# Patient Record
Sex: Female | Born: 1950 | Race: Black or African American | Hispanic: No | Marital: Single | Smoking: Current every day smoker
Health system: Southern US, Community
[De-identification: ages and names within clinical notes are randomized; demographics above are authoritative.]

## PROBLEM LIST (undated history)

## (undated) DIAGNOSIS — G5600 Carpal tunnel syndrome, unspecified upper limb: Secondary | ICD-10-CM

## (undated) DIAGNOSIS — M1990 Unspecified osteoarthritis, unspecified site: Secondary | ICD-10-CM

## (undated) DIAGNOSIS — K922 Gastrointestinal hemorrhage, unspecified: Secondary | ICD-10-CM

## (undated) DIAGNOSIS — G5603 Carpal tunnel syndrome, bilateral upper limbs: Secondary | ICD-10-CM

## (undated) DIAGNOSIS — I70219 Atherosclerosis of native arteries of extremities with intermittent claudication, unspecified extremity: Secondary | ICD-10-CM

## (undated) DIAGNOSIS — L98499 Non-pressure chronic ulcer of skin of other sites with unspecified severity: Secondary | ICD-10-CM

## (undated) DIAGNOSIS — I429 Cardiomyopathy, unspecified: Secondary | ICD-10-CM

## (undated) DIAGNOSIS — I739 Peripheral vascular disease, unspecified: Secondary | ICD-10-CM

## (undated) DIAGNOSIS — I442 Atrioventricular block, complete: Secondary | ICD-10-CM

## (undated) DIAGNOSIS — E11621 Type 2 diabetes mellitus with foot ulcer: Secondary | ICD-10-CM

## (undated) DIAGNOSIS — Z950 Presence of cardiac pacemaker: Secondary | ICD-10-CM

## (undated) DIAGNOSIS — I455 Other specified heart block: Secondary | ICD-10-CM

## (undated) DIAGNOSIS — D649 Anemia, unspecified: Secondary | ICD-10-CM

## (undated) DIAGNOSIS — R911 Solitary pulmonary nodule: Secondary | ICD-10-CM

## (undated) DIAGNOSIS — F1011 Alcohol abuse, in remission: Secondary | ICD-10-CM

## (undated) DIAGNOSIS — N184 Chronic kidney disease, stage 4 (severe): Secondary | ICD-10-CM

## (undated) DIAGNOSIS — E7800 Pure hypercholesterolemia, unspecified: Secondary | ICD-10-CM

## (undated) DIAGNOSIS — Z720 Tobacco use: Secondary | ICD-10-CM

## (undated) DIAGNOSIS — R413 Other amnesia: Secondary | ICD-10-CM

## (undated) DIAGNOSIS — K529 Noninfective gastroenteritis and colitis, unspecified: Secondary | ICD-10-CM

## (undated) DIAGNOSIS — I469 Cardiac arrest, cause unspecified: Secondary | ICD-10-CM

## (undated) DIAGNOSIS — B351 Tinea unguium: Secondary | ICD-10-CM

## (undated) DIAGNOSIS — H409 Unspecified glaucoma: Secondary | ICD-10-CM

## (undated) DIAGNOSIS — I2510 Atherosclerotic heart disease of native coronary artery without angina pectoris: Secondary | ICD-10-CM

## (undated) DIAGNOSIS — I509 Heart failure, unspecified: Secondary | ICD-10-CM

## (undated) DIAGNOSIS — J302 Other seasonal allergic rhinitis: Secondary | ICD-10-CM

## (undated) DIAGNOSIS — L97509 Non-pressure chronic ulcer of other part of unspecified foot with unspecified severity: Secondary | ICD-10-CM

## (undated) DIAGNOSIS — Z794 Long term (current) use of insulin: Secondary | ICD-10-CM

## (undated) DIAGNOSIS — Z5189 Encounter for other specified aftercare: Secondary | ICD-10-CM

## (undated) DIAGNOSIS — M75100 Unspecified rotator cuff tear or rupture of unspecified shoulder, not specified as traumatic: Secondary | ICD-10-CM

## (undated) DIAGNOSIS — R51 Headache: Secondary | ICD-10-CM

## (undated) DIAGNOSIS — K559 Vascular disorder of intestine, unspecified: Secondary | ICD-10-CM

## (undated) DIAGNOSIS — I10 Essential (primary) hypertension: Secondary | ICD-10-CM

## (undated) DIAGNOSIS — Z87442 Personal history of urinary calculi: Secondary | ICD-10-CM

## (undated) DIAGNOSIS — E119 Type 2 diabetes mellitus without complications: Secondary | ICD-10-CM

## (undated) HISTORY — PX: ABDOMINAL HYSTERECTOMY: SHX81

## (undated) HISTORY — DX: Vascular disorder of intestine, unspecified: K55.9

## (undated) HISTORY — DX: Atrioventricular block, complete: I44.2

## (undated) HISTORY — DX: Atherosclerotic heart disease of native coronary artery without angina pectoris: I25.10

## (undated) HISTORY — PX: JOINT REPLACEMENT: SHX530

## (undated) HISTORY — DX: Tobacco use: Z72.0

## (undated) HISTORY — PX: CARPAL TUNNEL RELEASE: SHX101

## (undated) HISTORY — DX: Type 2 diabetes mellitus without complications: E11.9

## (undated) HISTORY — DX: Non-pressure chronic ulcer of skin of other sites with unspecified severity: L98.499

## (undated) HISTORY — PX: EYE SURGERY: SHX253

## (undated) HISTORY — DX: Other seasonal allergic rhinitis: J30.2

## (undated) HISTORY — DX: Peripheral vascular disease, unspecified: I73.9

## (undated) HISTORY — DX: Type 2 diabetes mellitus without complications: Z79.4

## (undated) HISTORY — PX: TOE AMPUTATION: SHX809

## (undated) HISTORY — DX: Solitary pulmonary nodule: R91.1

## (undated) HISTORY — DX: Tinea unguium: B35.1

## (undated) HISTORY — DX: Atherosclerosis of native arteries of extremities with intermittent claudication, unspecified extremity: I70.219

## (undated) HISTORY — PX: TONSILLECTOMY: SUR1361

## (undated) HISTORY — DX: Unspecified osteoarthritis, unspecified site: M19.90

## (undated) HISTORY — DX: Presence of cardiac pacemaker: Z95.0

## (undated) HISTORY — DX: Noninfective gastroenteritis and colitis, unspecified: K52.9

## (undated) HISTORY — DX: Type 2 diabetes mellitus with foot ulcer: L97.509

## (undated) HISTORY — DX: Carpal tunnel syndrome, bilateral upper limbs: G56.03

## (undated) HISTORY — DX: Other specified heart block: I45.5

## (undated) HISTORY — DX: Cardiac arrest, cause unspecified: I46.9

## (undated) HISTORY — DX: Essential (primary) hypertension: I10

## (undated) HISTORY — DX: Alcohol abuse, in remission: F10.11

## (undated) HISTORY — DX: Type 2 diabetes mellitus with foot ulcer: E11.621

## (undated) HISTORY — DX: Unspecified glaucoma: H40.9

## (undated) HISTORY — DX: Other amnesia: R41.3

---

## 1998-02-22 ENCOUNTER — Encounter: Admission: RE | Admit: 1998-02-22 | Discharge: 1998-02-22 | Payer: Self-pay | Admitting: Internal Medicine

## 1998-03-16 ENCOUNTER — Ambulatory Visit (HOSPITAL_COMMUNITY): Admission: RE | Admit: 1998-03-16 | Discharge: 1998-03-16 | Payer: Self-pay | Admitting: Podiatry

## 1998-08-28 ENCOUNTER — Encounter: Admission: RE | Admit: 1998-08-28 | Discharge: 1998-08-28 | Payer: Self-pay | Admitting: Hematology and Oncology

## 1998-09-06 ENCOUNTER — Encounter: Admission: RE | Admit: 1998-09-06 | Discharge: 1998-09-06 | Payer: Self-pay | Admitting: Hematology and Oncology

## 1998-09-11 ENCOUNTER — Encounter: Admission: RE | Admit: 1998-09-11 | Discharge: 1998-09-11 | Payer: Self-pay | Admitting: Hematology and Oncology

## 1999-02-25 ENCOUNTER — Other Ambulatory Visit: Admission: RE | Admit: 1999-02-25 | Discharge: 1999-03-15 | Payer: Self-pay

## 1999-02-25 ENCOUNTER — Encounter: Admission: RE | Admit: 1999-02-25 | Discharge: 1999-02-25 | Payer: Self-pay | Admitting: Internal Medicine

## 1999-05-20 ENCOUNTER — Encounter: Admission: RE | Admit: 1999-05-20 | Discharge: 1999-05-20 | Payer: Self-pay | Admitting: Internal Medicine

## 1999-05-20 ENCOUNTER — Other Ambulatory Visit: Admission: RE | Admit: 1999-05-20 | Discharge: 1999-05-20 | Payer: Self-pay | Admitting: Internal Medicine

## 1999-08-12 ENCOUNTER — Encounter: Admission: RE | Admit: 1999-08-12 | Discharge: 1999-08-12 | Payer: Self-pay | Admitting: Internal Medicine

## 1999-09-20 ENCOUNTER — Encounter: Admission: RE | Admit: 1999-09-20 | Discharge: 1999-09-20 | Payer: Self-pay | Admitting: Internal Medicine

## 2000-02-21 ENCOUNTER — Encounter: Admission: RE | Admit: 2000-02-21 | Discharge: 2000-02-21 | Payer: Self-pay | Admitting: Hematology and Oncology

## 2000-06-05 ENCOUNTER — Emergency Department (HOSPITAL_COMMUNITY): Admission: EM | Admit: 2000-06-05 | Discharge: 2000-06-05 | Payer: Self-pay | Admitting: Emergency Medicine

## 2000-06-06 ENCOUNTER — Encounter: Payer: Self-pay | Admitting: Emergency Medicine

## 2000-06-23 ENCOUNTER — Encounter: Payer: Self-pay | Admitting: Dermatology

## 2000-06-23 ENCOUNTER — Encounter: Admission: RE | Admit: 2000-06-23 | Discharge: 2000-06-23 | Payer: Self-pay | Admitting: Dermatology

## 2000-06-26 ENCOUNTER — Encounter: Admission: RE | Admit: 2000-06-26 | Discharge: 2000-06-26 | Payer: Self-pay | Admitting: Internal Medicine

## 2000-06-29 ENCOUNTER — Encounter (HOSPITAL_COMMUNITY): Admission: RE | Admit: 2000-06-29 | Discharge: 2000-09-27 | Payer: Self-pay | Admitting: Orthopedic Surgery

## 2000-11-20 ENCOUNTER — Encounter: Admission: RE | Admit: 2000-11-20 | Discharge: 2000-11-20 | Payer: Self-pay | Admitting: Obstetrics & Gynecology

## 2000-12-04 ENCOUNTER — Encounter: Admission: RE | Admit: 2000-12-04 | Discharge: 2000-12-04 | Payer: Self-pay | Admitting: Obstetrics & Gynecology

## 2000-12-09 ENCOUNTER — Ambulatory Visit (HOSPITAL_COMMUNITY): Admission: RE | Admit: 2000-12-09 | Discharge: 2000-12-09 | Payer: Self-pay | Admitting: Obstetrics

## 2001-01-05 ENCOUNTER — Encounter: Admission: RE | Admit: 2001-01-05 | Discharge: 2001-01-05 | Payer: Self-pay | Admitting: Obstetrics & Gynecology

## 2001-01-05 ENCOUNTER — Other Ambulatory Visit: Admission: RE | Admit: 2001-01-05 | Discharge: 2001-01-05 | Payer: Self-pay | Admitting: *Deleted

## 2001-01-19 ENCOUNTER — Encounter: Admission: RE | Admit: 2001-01-19 | Discharge: 2001-01-19 | Payer: Self-pay | Admitting: Obstetrics & Gynecology

## 2001-01-21 ENCOUNTER — Encounter: Admission: RE | Admit: 2001-01-21 | Discharge: 2001-01-21 | Payer: Self-pay | Admitting: Hematology and Oncology

## 2001-01-29 ENCOUNTER — Inpatient Hospital Stay (HOSPITAL_COMMUNITY): Admission: EM | Admit: 2001-01-29 | Discharge: 2001-01-30 | Payer: Self-pay | Admitting: Emergency Medicine

## 2001-01-29 ENCOUNTER — Encounter: Payer: Self-pay | Admitting: Emergency Medicine

## 2001-02-02 ENCOUNTER — Encounter: Admission: RE | Admit: 2001-02-02 | Discharge: 2001-02-02 | Payer: Self-pay | Admitting: Hematology and Oncology

## 2001-02-25 ENCOUNTER — Inpatient Hospital Stay (HOSPITAL_COMMUNITY): Admission: RE | Admit: 2001-02-25 | Discharge: 2001-02-28 | Payer: Self-pay | Admitting: *Deleted

## 2001-02-25 ENCOUNTER — Encounter (INDEPENDENT_AMBULATORY_CARE_PROVIDER_SITE_OTHER): Payer: Self-pay

## 2001-03-04 ENCOUNTER — Encounter: Admission: RE | Admit: 2001-03-04 | Discharge: 2001-03-04 | Payer: Self-pay | Admitting: Obstetrics

## 2001-04-15 ENCOUNTER — Encounter: Admission: RE | Admit: 2001-04-15 | Discharge: 2001-04-15 | Payer: Self-pay | Admitting: Obstetrics

## 2001-04-16 ENCOUNTER — Encounter: Admission: RE | Admit: 2001-04-16 | Discharge: 2001-04-16 | Payer: Self-pay | Admitting: Orthopedic Surgery

## 2001-05-18 ENCOUNTER — Encounter: Admission: RE | Admit: 2001-05-18 | Discharge: 2001-05-18 | Payer: Self-pay | Admitting: Orthopedic Surgery

## 2001-05-28 ENCOUNTER — Encounter: Admission: RE | Admit: 2001-05-28 | Discharge: 2001-05-28 | Payer: Self-pay | Admitting: Obstetrics & Gynecology

## 2001-06-09 ENCOUNTER — Emergency Department (HOSPITAL_COMMUNITY): Admission: EM | Admit: 2001-06-09 | Discharge: 2001-06-10 | Payer: Self-pay | Admitting: Emergency Medicine

## 2001-06-10 ENCOUNTER — Ambulatory Visit (HOSPITAL_COMMUNITY): Admission: RE | Admit: 2001-06-10 | Discharge: 2001-06-10 | Payer: Self-pay | Admitting: Obstetrics & Gynecology

## 2001-06-29 ENCOUNTER — Inpatient Hospital Stay (HOSPITAL_COMMUNITY): Admission: AD | Admit: 2001-06-29 | Discharge: 2001-07-10 | Payer: Self-pay | Admitting: Vascular Surgery

## 2001-06-29 ENCOUNTER — Encounter: Payer: Self-pay | Admitting: Vascular Surgery

## 2001-07-01 ENCOUNTER — Encounter: Payer: Self-pay | Admitting: Vascular Surgery

## 2001-09-29 HISTORY — PX: FEMORAL-POPLITEAL BYPASS GRAFT: SHX937

## 2001-10-12 ENCOUNTER — Encounter (HOSPITAL_COMMUNITY): Admission: RE | Admit: 2001-10-12 | Discharge: 2001-12-30 | Payer: Self-pay | Admitting: Orthopedic Surgery

## 2001-11-30 ENCOUNTER — Encounter: Admission: RE | Admit: 2001-11-30 | Discharge: 2001-11-30 | Payer: Self-pay | Admitting: *Deleted

## 2001-12-09 ENCOUNTER — Ambulatory Visit (HOSPITAL_COMMUNITY): Admission: RE | Admit: 2001-12-09 | Discharge: 2001-12-09 | Payer: Self-pay

## 2001-12-10 ENCOUNTER — Encounter: Payer: Self-pay | Admitting: *Deleted

## 2001-12-10 ENCOUNTER — Encounter: Admission: RE | Admit: 2001-12-10 | Discharge: 2001-12-10 | Payer: Self-pay | Admitting: *Deleted

## 2002-01-25 ENCOUNTER — Encounter: Admission: RE | Admit: 2002-01-25 | Discharge: 2002-01-25 | Payer: Self-pay | Admitting: *Deleted

## 2002-01-26 ENCOUNTER — Encounter: Payer: Self-pay | Admitting: *Deleted

## 2002-01-27 ENCOUNTER — Ambulatory Visit (HOSPITAL_COMMUNITY): Admission: RE | Admit: 2002-01-27 | Discharge: 2002-01-27 | Payer: Self-pay | Admitting: *Deleted

## 2002-02-02 ENCOUNTER — Encounter: Payer: Self-pay | Admitting: *Deleted

## 2002-02-02 ENCOUNTER — Inpatient Hospital Stay (HOSPITAL_COMMUNITY): Admission: RE | Admit: 2002-02-02 | Discharge: 2002-02-08 | Payer: Self-pay | Admitting: *Deleted

## 2002-02-03 ENCOUNTER — Encounter: Payer: Self-pay | Admitting: *Deleted

## 2002-02-05 ENCOUNTER — Encounter: Payer: Self-pay | Admitting: Vascular Surgery

## 2002-03-10 ENCOUNTER — Encounter: Admission: RE | Admit: 2002-03-10 | Discharge: 2002-03-10 | Payer: Self-pay | Admitting: Internal Medicine

## 2002-04-18 ENCOUNTER — Encounter: Admission: RE | Admit: 2002-04-18 | Discharge: 2002-04-18 | Payer: Self-pay | Admitting: Internal Medicine

## 2002-06-14 ENCOUNTER — Encounter: Admission: RE | Admit: 2002-06-14 | Discharge: 2002-06-14 | Payer: Self-pay | Admitting: Internal Medicine

## 2002-08-16 ENCOUNTER — Encounter: Admission: RE | Admit: 2002-08-16 | Discharge: 2002-08-16 | Payer: Self-pay | Admitting: Internal Medicine

## 2002-08-30 ENCOUNTER — Ambulatory Visit (HOSPITAL_COMMUNITY): Admission: RE | Admit: 2002-08-30 | Discharge: 2002-08-30 | Payer: Self-pay | Admitting: Internal Medicine

## 2002-11-23 ENCOUNTER — Encounter: Payer: Self-pay | Admitting: Vascular Surgery

## 2002-11-23 ENCOUNTER — Encounter: Admission: RE | Admit: 2002-11-23 | Discharge: 2002-11-23 | Payer: Self-pay | Admitting: Vascular Surgery

## 2002-12-05 ENCOUNTER — Emergency Department (HOSPITAL_COMMUNITY): Admission: EM | Admit: 2002-12-05 | Discharge: 2002-12-05 | Payer: Self-pay | Admitting: Emergency Medicine

## 2003-01-17 ENCOUNTER — Encounter: Admission: RE | Admit: 2003-01-17 | Discharge: 2003-01-17 | Payer: Self-pay | Admitting: Internal Medicine

## 2003-01-27 ENCOUNTER — Encounter: Admission: RE | Admit: 2003-01-27 | Discharge: 2003-01-27 | Payer: Self-pay | Admitting: Internal Medicine

## 2003-03-08 ENCOUNTER — Encounter: Admission: RE | Admit: 2003-03-08 | Discharge: 2003-03-08 | Payer: Self-pay | Admitting: Internal Medicine

## 2003-03-29 ENCOUNTER — Ambulatory Visit (HOSPITAL_COMMUNITY): Admission: RE | Admit: 2003-03-29 | Discharge: 2003-03-29 | Payer: Self-pay | Admitting: General Practice

## 2003-03-29 ENCOUNTER — Encounter: Payer: Self-pay | Admitting: Cardiovascular Disease

## 2003-04-13 ENCOUNTER — Encounter: Admission: RE | Admit: 2003-04-13 | Discharge: 2003-04-13 | Payer: Self-pay | Admitting: Internal Medicine

## 2003-05-05 ENCOUNTER — Ambulatory Visit (HOSPITAL_COMMUNITY): Admission: RE | Admit: 2003-05-05 | Discharge: 2003-05-05 | Payer: Self-pay | Admitting: *Deleted

## 2003-09-04 ENCOUNTER — Ambulatory Visit (HOSPITAL_COMMUNITY): Admission: RE | Admit: 2003-09-04 | Discharge: 2003-09-04 | Payer: Self-pay | Admitting: Internal Medicine

## 2003-09-30 DIAGNOSIS — Z5189 Encounter for other specified aftercare: Secondary | ICD-10-CM

## 2003-09-30 DIAGNOSIS — IMO0001 Reserved for inherently not codable concepts without codable children: Secondary | ICD-10-CM

## 2003-09-30 HISTORY — DX: Encounter for other specified aftercare: Z51.89

## 2003-09-30 HISTORY — DX: Reserved for inherently not codable concepts without codable children: IMO0001

## 2003-10-16 ENCOUNTER — Emergency Department (HOSPITAL_COMMUNITY): Admission: EM | Admit: 2003-10-16 | Discharge: 2003-10-17 | Payer: Self-pay | Admitting: Emergency Medicine

## 2003-11-15 ENCOUNTER — Encounter: Admission: RE | Admit: 2003-11-15 | Discharge: 2003-11-15 | Payer: Self-pay | Admitting: Internal Medicine

## 2003-12-04 ENCOUNTER — Ambulatory Visit (HOSPITAL_COMMUNITY): Admission: RE | Admit: 2003-12-04 | Discharge: 2003-12-04 | Payer: Self-pay | Admitting: Internal Medicine

## 2004-01-04 ENCOUNTER — Emergency Department (HOSPITAL_COMMUNITY): Admission: EM | Admit: 2004-01-04 | Discharge: 2004-01-04 | Payer: Self-pay

## 2004-01-09 ENCOUNTER — Encounter: Admission: RE | Admit: 2004-01-09 | Discharge: 2004-03-01 | Payer: Self-pay | Admitting: Orthopedic Surgery

## 2004-05-08 ENCOUNTER — Encounter: Admission: RE | Admit: 2004-05-08 | Discharge: 2004-05-08 | Payer: Self-pay | Admitting: Internal Medicine

## 2004-05-13 ENCOUNTER — Ambulatory Visit (HOSPITAL_COMMUNITY): Admission: RE | Admit: 2004-05-13 | Discharge: 2004-05-13 | Payer: Self-pay | Admitting: Vascular Surgery

## 2004-05-21 ENCOUNTER — Inpatient Hospital Stay (HOSPITAL_COMMUNITY): Admission: RE | Admit: 2004-05-21 | Discharge: 2004-05-23 | Payer: Self-pay | Admitting: Vascular Surgery

## 2004-06-02 DIAGNOSIS — I469 Cardiac arrest, cause unspecified: Secondary | ICD-10-CM | POA: Insufficient documentation

## 2004-06-03 ENCOUNTER — Ambulatory Visit: Payer: Self-pay | Admitting: Internal Medicine

## 2004-06-03 ENCOUNTER — Inpatient Hospital Stay (HOSPITAL_COMMUNITY): Admission: EM | Admit: 2004-06-03 | Discharge: 2004-06-18 | Payer: Self-pay | Admitting: Emergency Medicine

## 2004-06-03 ENCOUNTER — Encounter: Payer: Self-pay | Admitting: Cardiology

## 2004-07-24 ENCOUNTER — Ambulatory Visit: Payer: Self-pay | Admitting: Internal Medicine

## 2004-09-18 ENCOUNTER — Encounter: Admission: RE | Admit: 2004-09-18 | Discharge: 2004-09-18 | Payer: Self-pay | Admitting: Internal Medicine

## 2004-10-18 ENCOUNTER — Ambulatory Visit: Payer: Self-pay | Admitting: Internal Medicine

## 2004-12-17 ENCOUNTER — Ambulatory Visit: Payer: Self-pay | Admitting: Internal Medicine

## 2005-01-29 ENCOUNTER — Ambulatory Visit: Payer: Self-pay | Admitting: Internal Medicine

## 2005-03-12 ENCOUNTER — Ambulatory Visit: Payer: Self-pay | Admitting: Internal Medicine

## 2005-05-26 ENCOUNTER — Ambulatory Visit: Payer: Self-pay | Admitting: Internal Medicine

## 2005-09-10 ENCOUNTER — Emergency Department (HOSPITAL_COMMUNITY): Admission: EM | Admit: 2005-09-10 | Discharge: 2005-09-10 | Payer: Self-pay | Admitting: Emergency Medicine

## 2005-09-15 ENCOUNTER — Ambulatory Visit: Payer: Self-pay | Admitting: Internal Medicine

## 2006-02-18 ENCOUNTER — Emergency Department (HOSPITAL_COMMUNITY): Admission: EM | Admit: 2006-02-18 | Discharge: 2006-02-18 | Payer: Self-pay | Admitting: *Deleted

## 2006-03-24 ENCOUNTER — Encounter: Admission: RE | Admit: 2006-03-24 | Discharge: 2006-05-20 | Payer: Self-pay | Admitting: General Practice

## 2006-04-14 ENCOUNTER — Ambulatory Visit: Payer: Self-pay | Admitting: Internal Medicine

## 2006-06-27 ENCOUNTER — Emergency Department (HOSPITAL_COMMUNITY): Admission: EM | Admit: 2006-06-27 | Discharge: 2006-06-27 | Payer: Self-pay | Admitting: Family Medicine

## 2006-07-17 ENCOUNTER — Ambulatory Visit: Payer: Self-pay | Admitting: Internal Medicine

## 2006-07-17 ENCOUNTER — Encounter (INDEPENDENT_AMBULATORY_CARE_PROVIDER_SITE_OTHER): Payer: Self-pay | Admitting: *Deleted

## 2006-07-17 LAB — CONVERTED CEMR LAB
B-12, serum: 539 pg/mL (ref 211–911)
T4, Total: 7.6 ug/dL (ref 5.0–12.5)

## 2006-07-20 ENCOUNTER — Ambulatory Visit (HOSPITAL_COMMUNITY): Admission: RE | Admit: 2006-07-20 | Discharge: 2006-07-20 | Payer: Self-pay | Admitting: *Deleted

## 2006-08-12 DIAGNOSIS — J309 Allergic rhinitis, unspecified: Secondary | ICD-10-CM | POA: Insufficient documentation

## 2006-08-12 DIAGNOSIS — E1139 Type 2 diabetes mellitus with other diabetic ophthalmic complication: Secondary | ICD-10-CM | POA: Insufficient documentation

## 2006-08-12 DIAGNOSIS — I10 Essential (primary) hypertension: Secondary | ICD-10-CM | POA: Insufficient documentation

## 2006-08-12 DIAGNOSIS — E1149 Type 2 diabetes mellitus with other diabetic neurological complication: Secondary | ICD-10-CM | POA: Insufficient documentation

## 2006-08-12 DIAGNOSIS — I739 Peripheral vascular disease, unspecified: Secondary | ICD-10-CM | POA: Insufficient documentation

## 2006-08-12 DIAGNOSIS — F1021 Alcohol dependence, in remission: Secondary | ICD-10-CM | POA: Insufficient documentation

## 2006-08-12 DIAGNOSIS — I2510 Atherosclerotic heart disease of native coronary artery without angina pectoris: Secondary | ICD-10-CM | POA: Insufficient documentation

## 2006-08-12 DIAGNOSIS — E1129 Type 2 diabetes mellitus with other diabetic kidney complication: Secondary | ICD-10-CM | POA: Insufficient documentation

## 2006-08-12 DIAGNOSIS — L97509 Non-pressure chronic ulcer of other part of unspecified foot with unspecified severity: Secondary | ICD-10-CM | POA: Insufficient documentation

## 2006-08-12 DIAGNOSIS — B351 Tinea unguium: Secondary | ICD-10-CM | POA: Insufficient documentation

## 2006-08-12 DIAGNOSIS — Z9079 Acquired absence of other genital organ(s): Secondary | ICD-10-CM | POA: Insufficient documentation

## 2006-08-12 DIAGNOSIS — M19049 Primary osteoarthritis, unspecified hand: Secondary | ICD-10-CM | POA: Insufficient documentation

## 2006-08-12 DIAGNOSIS — G5600 Carpal tunnel syndrome, unspecified upper limb: Secondary | ICD-10-CM | POA: Insufficient documentation

## 2006-08-12 DIAGNOSIS — E109 Type 1 diabetes mellitus without complications: Secondary | ICD-10-CM | POA: Insufficient documentation

## 2006-10-29 ENCOUNTER — Encounter (INDEPENDENT_AMBULATORY_CARE_PROVIDER_SITE_OTHER): Payer: Self-pay | Admitting: Specialist

## 2006-10-29 ENCOUNTER — Inpatient Hospital Stay (HOSPITAL_COMMUNITY): Admission: RE | Admit: 2006-10-29 | Discharge: 2006-10-31 | Payer: Self-pay | Admitting: Vascular Surgery

## 2006-11-11 ENCOUNTER — Ambulatory Visit: Payer: Self-pay | Admitting: Vascular Surgery

## 2006-11-25 ENCOUNTER — Ambulatory Visit: Payer: Self-pay | Admitting: Vascular Surgery

## 2006-11-26 ENCOUNTER — Ambulatory Visit (HOSPITAL_COMMUNITY): Admission: RE | Admit: 2006-11-26 | Discharge: 2006-11-26 | Payer: Self-pay | Admitting: Hospitalist

## 2006-11-26 ENCOUNTER — Encounter (INDEPENDENT_AMBULATORY_CARE_PROVIDER_SITE_OTHER): Payer: Self-pay | Admitting: *Deleted

## 2006-11-26 ENCOUNTER — Ambulatory Visit: Payer: Self-pay | Admitting: Internal Medicine

## 2006-11-26 LAB — CONVERTED CEMR LAB
ALT: <8 units/L (ref 0–35)
AST: 13 units/L (ref 0–37)
Albumin: 3.8 g/dL (ref 3.5–5.2)
Calcium: 9.2 mg/dL (ref 8.4–10.5)
Chloride: 112 meq/L (ref 96–112)
Creatinine, Ser: 1.36 mg/dL — ABNORMAL HIGH (ref 0.40–1.20)
Glucose, Bld: 89 mg/dL (ref 70–99)
Potassium: 4.5 meq/L (ref 3.5–5.3)
Sodium: 141 meq/L (ref 135–145)
Total CHOL/HDL Ratio: 2.8

## 2006-12-01 ENCOUNTER — Telehealth: Payer: Self-pay | Admitting: *Deleted

## 2006-12-02 ENCOUNTER — Ambulatory Visit: Payer: Self-pay | Admitting: Vascular Surgery

## 2006-12-07 ENCOUNTER — Encounter: Admission: RE | Admit: 2006-12-07 | Discharge: 2006-12-07 | Payer: Self-pay | Admitting: Sports Medicine

## 2006-12-08 ENCOUNTER — Ambulatory Visit (HOSPITAL_COMMUNITY): Admission: RE | Admit: 2006-12-08 | Discharge: 2006-12-08 | Payer: Self-pay | Admitting: *Deleted

## 2006-12-08 ENCOUNTER — Encounter (INDEPENDENT_AMBULATORY_CARE_PROVIDER_SITE_OTHER): Payer: Self-pay | Admitting: *Deleted

## 2006-12-10 ENCOUNTER — Encounter (INDEPENDENT_AMBULATORY_CARE_PROVIDER_SITE_OTHER): Payer: Self-pay | Admitting: *Deleted

## 2006-12-10 ENCOUNTER — Ambulatory Visit: Payer: Self-pay | Admitting: *Deleted

## 2006-12-10 LAB — CONVERTED CEMR LAB
Basophils Absolute: 0 10*3/uL (ref 0.0–0.1)
Basophils Relative: 1 % (ref 0–1)
Eosinophils Absolute: 0.4 10*3/uL (ref 0.0–0.7)
Eosinophils Relative: 7 % — ABNORMAL HIGH (ref 0–5)
HCT: 36.9 % (ref 36.0–46.0)
Hemoglobin: 12.2 g/dL (ref 12.0–15.0)
INR: 1 (ref 0.0–1.5)
Iron: 77 ug/dL (ref 42–145)
Lymphocytes Relative: 35 % (ref 12–46)
Lymphs Abs: 2.5 10*3/uL (ref 0.7–3.3)
MCHC: 33 g/dL (ref 30.0–36.0)
MCV: 86.3 fL (ref 78.0–100.0)
Monocytes Absolute: 0.4 10*3/uL (ref 0.2–0.7)
Monocytes Relative: 6 % (ref 3–11)
Neutro Abs: 3.7 10*3/uL (ref 1.7–7.7)
Neutrophils Relative %: 52 % (ref 43–77)
Platelets: 197 10*3/uL (ref 150–400)
Prothrombin Time: 13.4 s (ref 11.6–15.2)
RBC Folate: 644 ng/mL — ABNORMAL HIGH (ref 180–600)
RBC: 4.28 M/uL (ref 3.87–5.11)
RDW: 16.1 % — ABNORMAL HIGH (ref 11.5–14.0)
TSH: 1.306 microintl units/mL (ref 0.350–5.50)
Vitamin B-12: 334 pg/mL (ref 211–911)
WBC: 7.1 10*3/uL (ref 4.0–10.5)
aPTT: 34 s (ref 24–37)

## 2006-12-16 ENCOUNTER — Ambulatory Visit: Payer: Self-pay | Admitting: Vascular Surgery

## 2006-12-21 ENCOUNTER — Encounter: Admission: RE | Admit: 2006-12-21 | Discharge: 2006-12-21 | Payer: Self-pay | Admitting: Sports Medicine

## 2006-12-22 ENCOUNTER — Ambulatory Visit: Payer: Self-pay | Admitting: Internal Medicine

## 2006-12-30 ENCOUNTER — Ambulatory Visit: Payer: Self-pay | Admitting: Gastroenterology

## 2007-01-06 ENCOUNTER — Ambulatory Visit: Payer: Self-pay | Admitting: Vascular Surgery

## 2007-02-09 ENCOUNTER — Ambulatory Visit: Payer: Self-pay | Admitting: Vascular Surgery

## 2007-02-09 ENCOUNTER — Encounter (INDEPENDENT_AMBULATORY_CARE_PROVIDER_SITE_OTHER): Payer: Self-pay | Admitting: *Deleted

## 2007-04-14 ENCOUNTER — Ambulatory Visit: Payer: Self-pay | Admitting: Vascular Surgery

## 2007-07-15 ENCOUNTER — Telehealth: Payer: Self-pay | Admitting: *Deleted

## 2007-07-16 ENCOUNTER — Telehealth (INDEPENDENT_AMBULATORY_CARE_PROVIDER_SITE_OTHER): Payer: Self-pay | Admitting: *Deleted

## 2007-08-09 ENCOUNTER — Ambulatory Visit: Payer: Self-pay | Admitting: Vascular Surgery

## 2007-08-25 ENCOUNTER — Encounter (INDEPENDENT_AMBULATORY_CARE_PROVIDER_SITE_OTHER): Payer: Self-pay | Admitting: *Deleted

## 2007-11-19 ENCOUNTER — Emergency Department (HOSPITAL_COMMUNITY): Admission: EM | Admit: 2007-11-19 | Discharge: 2007-11-19 | Payer: Self-pay | Admitting: Family Medicine

## 2007-11-23 ENCOUNTER — Ambulatory Visit: Payer: Self-pay | Admitting: Internal Medicine

## 2007-11-23 ENCOUNTER — Encounter (INDEPENDENT_AMBULATORY_CARE_PROVIDER_SITE_OTHER): Payer: Self-pay | Admitting: *Deleted

## 2007-11-23 DIAGNOSIS — F17200 Nicotine dependence, unspecified, uncomplicated: Secondary | ICD-10-CM | POA: Insufficient documentation

## 2007-11-23 DIAGNOSIS — E785 Hyperlipidemia, unspecified: Secondary | ICD-10-CM | POA: Insufficient documentation

## 2007-11-24 LAB — CONVERTED CEMR LAB
AST: 19 units/L (ref 0–37)
Albumin: 4.3 g/dL (ref 3.5–5.2)
Alkaline Phosphatase: 104 units/L (ref 39–117)
Chloride: 112 meq/L (ref 96–112)
Cholesterol: 205 mg/dL — ABNORMAL HIGH (ref 0–200)
Creatinine, Ser: 1.56 mg/dL — ABNORMAL HIGH (ref 0.40–1.20)
HDL: 57 mg/dL (ref >39)
LDL Cholesterol: 93 mg/dL (ref 0–99)
Sodium: 141 meq/L (ref 135–145)
Total Bilirubin: 0.6 mg/dL (ref 0.3–1.2)
Total CHOL/HDL Ratio: 3.6
Total Protein: 7.5 g/dL (ref 6.0–8.3)
VLDL: 55 mg/dL — ABNORMAL HIGH (ref 0–40)

## 2007-12-07 ENCOUNTER — Ambulatory Visit: Payer: Self-pay | Admitting: Hospitalist

## 2007-12-07 ENCOUNTER — Encounter (INDEPENDENT_AMBULATORY_CARE_PROVIDER_SITE_OTHER): Payer: Self-pay | Admitting: *Deleted

## 2007-12-08 LAB — CONVERTED CEMR LAB
CO2: 21 meq/L (ref 19–32)
Calcium: 9.1 mg/dL (ref 8.4–10.5)
Chloride: 111 meq/L (ref 96–112)
Glucose, Bld: 159 mg/dL — ABNORMAL HIGH (ref 70–99)
Potassium: 3.6 meq/L (ref 3.5–5.3)
Sodium: 142 meq/L (ref 135–145)

## 2007-12-28 ENCOUNTER — Ambulatory Visit: Payer: Self-pay | Admitting: Internal Medicine

## 2007-12-28 LAB — CONVERTED CEMR LAB: Blood Glucose, Fingerstick: 94

## 2008-01-04 ENCOUNTER — Encounter: Admission: RE | Admit: 2008-01-04 | Discharge: 2008-01-04 | Payer: Self-pay | Admitting: *Deleted

## 2008-02-08 ENCOUNTER — Ambulatory Visit: Payer: Self-pay | Admitting: Vascular Surgery

## 2008-04-06 ENCOUNTER — Telehealth: Payer: Self-pay | Admitting: Internal Medicine

## 2008-04-11 ENCOUNTER — Ambulatory Visit: Payer: Self-pay | Admitting: Internal Medicine

## 2008-04-11 ENCOUNTER — Encounter: Payer: Self-pay | Admitting: Internal Medicine

## 2008-04-11 LAB — CONVERTED CEMR LAB
ALT: 10 units/L (ref 0–35)
AST: 16 units/L (ref 0–37)
Albumin: 3.9 g/dL (ref 3.5–5.2)
Aldosterone, Serum: 35
Blood Glucose, Fingerstick: 120
Calcium: 9.3 mg/dL (ref 8.4–10.5)
Creatinine, Ser: 1.43 mg/dL — ABNORMAL HIGH (ref 0.40–1.20)
Creatinine, Urine: 152.4 mg/dL
Microalb, Ur: 96.6 mg/dL — ABNORMAL HIGH (ref 0.00–1.89)
PRA: 2
Potassium: 4 meq/L (ref 3.5–5.3)
Total Protein: 7.2 g/dL (ref 6.0–8.3)

## 2008-04-25 ENCOUNTER — Ambulatory Visit: Payer: Self-pay | Admitting: Internal Medicine

## 2008-05-05 ENCOUNTER — Encounter: Payer: Self-pay | Admitting: Internal Medicine

## 2008-06-09 ENCOUNTER — Telehealth: Payer: Self-pay | Admitting: Internal Medicine

## 2008-07-22 ENCOUNTER — Emergency Department (HOSPITAL_COMMUNITY): Admission: EM | Admit: 2008-07-22 | Discharge: 2008-07-22 | Payer: Self-pay | Admitting: Emergency Medicine

## 2008-07-22 DIAGNOSIS — R911 Solitary pulmonary nodule: Secondary | ICD-10-CM

## 2008-07-22 HISTORY — DX: Solitary pulmonary nodule: R91.1

## 2008-08-07 ENCOUNTER — Encounter: Payer: Self-pay | Admitting: Internal Medicine

## 2008-08-07 ENCOUNTER — Ambulatory Visit: Payer: Self-pay | Admitting: *Deleted

## 2008-08-07 DIAGNOSIS — N200 Calculus of kidney: Secondary | ICD-10-CM | POA: Insufficient documentation

## 2008-08-08 ENCOUNTER — Ambulatory Visit: Payer: Self-pay | Admitting: Vascular Surgery

## 2008-08-08 ENCOUNTER — Encounter: Payer: Self-pay | Admitting: Internal Medicine

## 2008-08-21 ENCOUNTER — Telehealth (INDEPENDENT_AMBULATORY_CARE_PROVIDER_SITE_OTHER): Payer: Self-pay | Admitting: *Deleted

## 2008-08-22 DIAGNOSIS — D649 Anemia, unspecified: Secondary | ICD-10-CM | POA: Insufficient documentation

## 2008-08-22 LAB — CONVERTED CEMR LAB
Basophils Absolute: 0 10*3/uL (ref 0.0–0.1)
Basophils Relative: 1 % (ref 0–1)
Bilirubin Urine: NEGATIVE
CO2: 18 meq/L — ABNORMAL LOW (ref 19–32)
Calcium: 9.3 mg/dL (ref 8.4–10.5)
Cholesterol: 154 mg/dL (ref 0–200)
Hemoglobin, Urine: NEGATIVE
Ketones, ur: NEGATIVE mg/dL
Monocytes Absolute: 0.6 10*3/uL (ref 0.1–1.0)
Monocytes Relative: 7 % (ref 3–12)
Neutrophils Relative %: 51 % (ref 43–77)
Protein, ur: NEGATIVE mg/dL
RBC: 3.88 M/uL (ref 3.87–5.11)
Sodium: 141 meq/L (ref 135–145)
Triglycerides: 194 mg/dL — ABNORMAL HIGH (ref <150)
Urine Glucose: NEGATIVE mg/dL
Urobilinogen, UA: 0.2 (ref 0.0–1.0)
VLDL: 39 mg/dL (ref 0–40)
pH: 5 (ref 5.0–8.0)

## 2008-09-05 ENCOUNTER — Ambulatory Visit: Payer: Self-pay | Admitting: Vascular Surgery

## 2008-10-23 ENCOUNTER — Encounter: Payer: Self-pay | Admitting: Internal Medicine

## 2008-12-21 ENCOUNTER — Encounter (INDEPENDENT_AMBULATORY_CARE_PROVIDER_SITE_OTHER): Payer: Self-pay | Admitting: Internal Medicine

## 2008-12-21 ENCOUNTER — Ambulatory Visit: Payer: Self-pay | Admitting: Internal Medicine

## 2008-12-21 ENCOUNTER — Encounter: Payer: Self-pay | Admitting: Internal Medicine

## 2008-12-21 LAB — CONVERTED CEMR LAB: Blood Glucose, Fingerstick: 70

## 2008-12-22 LAB — CONVERTED CEMR LAB
ALT: 9 units/L (ref 0–35)
Alkaline Phosphatase: 104 units/L (ref 39–117)
Basophils Absolute: 0 10*3/uL (ref 0.0–0.1)
Basophils Relative: 0 % (ref 0–1)
CO2: 22 meq/L (ref 19–32)
Creatinine, Ser: 1.57 mg/dL — ABNORMAL HIGH (ref 0.40–1.20)
Eosinophils Absolute: 0.3 10*3/uL (ref 0.0–0.7)
Eosinophils Relative: 3 % (ref 0–5)
GFR calc non Af Amer: 34 mL/min — ABNORMAL LOW (ref >60)
Glucose, Bld: 101 mg/dL — ABNORMAL HIGH (ref 70–99)
HDL: 49 mg/dL (ref >39)
Hemoglobin: 13.4 g/dL (ref 12.0–15.0)
MCV: 90.3 fL (ref 78.0–100.0)
Magnesium: 1.6 mg/dL (ref 1.5–2.5)
Platelets: 192 10*3/uL (ref 150–400)
Sodium: 144 meq/L (ref 135–145)
Total CHOL/HDL Ratio: 4.8
Total Protein: 7.4 g/dL (ref 6.0–8.3)
VLDL: 33 mg/dL (ref 0–40)
WBC: 7.6 10*3/uL (ref 4.0–10.5)

## 2008-12-25 ENCOUNTER — Telehealth: Payer: Self-pay | Admitting: Internal Medicine

## 2009-01-22 ENCOUNTER — Telehealth: Payer: Self-pay | Admitting: Internal Medicine

## 2009-01-23 ENCOUNTER — Encounter: Admission: RE | Admit: 2009-01-23 | Discharge: 2009-01-23 | Payer: Self-pay | Admitting: *Deleted

## 2009-02-05 ENCOUNTER — Telehealth: Payer: Self-pay | Admitting: Internal Medicine

## 2009-02-19 ENCOUNTER — Ambulatory Visit: Payer: Self-pay | Admitting: Vascular Surgery

## 2009-02-27 ENCOUNTER — Telehealth: Payer: Self-pay | Admitting: Internal Medicine

## 2009-04-12 ENCOUNTER — Telehealth: Payer: Self-pay | Admitting: Internal Medicine

## 2009-05-16 ENCOUNTER — Encounter: Payer: Self-pay | Admitting: Internal Medicine

## 2009-06-18 ENCOUNTER — Emergency Department (HOSPITAL_COMMUNITY): Admission: EM | Admit: 2009-06-18 | Discharge: 2009-06-18 | Payer: Self-pay | Admitting: Emergency Medicine

## 2009-06-22 ENCOUNTER — Ambulatory Visit: Payer: Self-pay | Admitting: Infectious Diseases

## 2009-07-02 ENCOUNTER — Telehealth: Payer: Self-pay | Admitting: Internal Medicine

## 2009-07-09 ENCOUNTER — Ambulatory Visit: Payer: Self-pay | Admitting: Internal Medicine

## 2009-07-10 ENCOUNTER — Encounter: Payer: Self-pay | Admitting: Internal Medicine

## 2009-07-10 LAB — HM DIABETES EYE EXAM

## 2009-07-11 LAB — CONVERTED CEMR LAB
Albumin: 4.1 g/dL (ref 3.5–5.2)
BUN: 20 mg/dL (ref 6–23)
CO2: 22 meq/L (ref 19–32)
Calcium: 9.6 mg/dL (ref 8.4–10.5)
Cholesterol: 151 mg/dL (ref 0–200)
Glucose, Bld: 155 mg/dL — ABNORMAL HIGH (ref 70–99)
HDL: 67 mg/dL (ref >39)
Potassium: 5 meq/L (ref 3.5–5.3)
Sodium: 149 meq/L — ABNORMAL HIGH (ref 135–145)
Total Protein: 7.2 g/dL (ref 6.0–8.3)
Triglycerides: 106 mg/dL (ref <150)

## 2009-07-13 ENCOUNTER — Encounter: Payer: Self-pay | Admitting: Internal Medicine

## 2009-08-03 ENCOUNTER — Telehealth: Payer: Self-pay | Admitting: Internal Medicine

## 2009-08-06 ENCOUNTER — Ambulatory Visit: Payer: Self-pay | Admitting: Internal Medicine

## 2009-08-07 LAB — CONVERTED CEMR LAB
Albumin: 4 g/dL (ref 3.5–5.2)
BUN: 18 mg/dL (ref 6–23)
CO2: 19 meq/L (ref 19–32)
Chloride: 112 meq/L (ref 96–112)
Creatinine, Ser: 1.74 mg/dL — ABNORMAL HIGH (ref 0.40–1.20)
Glucose, Bld: 104 mg/dL — ABNORMAL HIGH (ref 70–99)

## 2009-08-16 ENCOUNTER — Ambulatory Visit: Payer: Self-pay | Admitting: Vascular Surgery

## 2009-09-18 ENCOUNTER — Telehealth: Payer: Self-pay | Admitting: Internal Medicine

## 2009-10-01 ENCOUNTER — Encounter: Payer: Self-pay | Admitting: Internal Medicine

## 2009-10-29 ENCOUNTER — Ambulatory Visit: Payer: Self-pay | Admitting: Internal Medicine

## 2009-10-29 ENCOUNTER — Encounter: Payer: Self-pay | Admitting: Internal Medicine

## 2009-10-29 ENCOUNTER — Telehealth: Payer: Self-pay | Admitting: Internal Medicine

## 2009-10-29 ENCOUNTER — Inpatient Hospital Stay (HOSPITAL_COMMUNITY): Admission: AD | Admit: 2009-10-29 | Discharge: 2009-10-31 | Payer: Self-pay | Admitting: Internal Medicine

## 2009-10-30 ENCOUNTER — Encounter: Payer: Self-pay | Admitting: Internal Medicine

## 2009-10-30 ENCOUNTER — Ambulatory Visit: Payer: Self-pay | Admitting: Vascular Surgery

## 2009-10-30 HISTORY — PX: INSERT / REPLACE / REMOVE PACEMAKER: SUR710

## 2009-10-31 ENCOUNTER — Encounter: Payer: Self-pay | Admitting: Internal Medicine

## 2009-11-12 ENCOUNTER — Ambulatory Visit: Payer: Self-pay | Admitting: Internal Medicine

## 2009-11-12 LAB — CONVERTED CEMR LAB
ALT: 35 units/L (ref 0–35)
BUN: 22 mg/dL (ref 6–23)
Blood Glucose, Fingerstick: 68
CO2: 24 meq/L (ref 19–32)
Calcium: 9.4 mg/dL (ref 8.4–10.5)
Chloride: 107 meq/L (ref 96–112)
Cholesterol: 141 mg/dL (ref 0–200)
Creatinine, Ser: 1.84 mg/dL — ABNORMAL HIGH (ref 0.40–1.20)
Free T4: 1.25 ng/dL (ref 0.80–1.80)
Glucose, Bld: 96 mg/dL (ref 70–99)
Total CHOL/HDL Ratio: 2.2
Triglycerides: 69 mg/dL (ref <150)

## 2009-11-13 ENCOUNTER — Telehealth: Payer: Self-pay | Admitting: Internal Medicine

## 2009-11-21 ENCOUNTER — Emergency Department (HOSPITAL_COMMUNITY): Admission: EM | Admit: 2009-11-21 | Discharge: 2009-11-21 | Payer: Self-pay | Admitting: Emergency Medicine

## 2009-11-21 ENCOUNTER — Telehealth: Payer: Self-pay | Admitting: Internal Medicine

## 2009-11-23 ENCOUNTER — Inpatient Hospital Stay (HOSPITAL_COMMUNITY): Admission: EM | Admit: 2009-11-23 | Discharge: 2009-11-27 | Payer: Self-pay | Admitting: Emergency Medicine

## 2009-11-23 ENCOUNTER — Ambulatory Visit: Payer: Self-pay | Admitting: Cardiovascular Disease

## 2009-11-23 ENCOUNTER — Ambulatory Visit: Payer: Self-pay | Admitting: Pulmonary Disease

## 2009-11-24 DIAGNOSIS — Z950 Presence of cardiac pacemaker: Secondary | ICD-10-CM

## 2009-11-24 HISTORY — DX: Presence of cardiac pacemaker: Z95.0

## 2009-11-26 ENCOUNTER — Encounter (INDEPENDENT_AMBULATORY_CARE_PROVIDER_SITE_OTHER): Payer: Self-pay | Admitting: *Deleted

## 2009-11-28 ENCOUNTER — Telehealth: Payer: Self-pay | Admitting: Internal Medicine

## 2009-12-03 ENCOUNTER — Ambulatory Visit: Payer: Self-pay

## 2009-12-03 ENCOUNTER — Ambulatory Visit: Payer: Self-pay | Admitting: Internal Medicine

## 2009-12-04 ENCOUNTER — Telehealth: Payer: Self-pay | Admitting: Internal Medicine

## 2009-12-04 LAB — CONVERTED CEMR LAB
CO2: 22 meq/L (ref 19–32)
Chloride: 117 meq/L — ABNORMAL HIGH (ref 96–112)
Creatinine, Ser: 1.9 mg/dL — ABNORMAL HIGH (ref 0.4–1.2)
Glucose, Bld: 155 mg/dL — ABNORMAL HIGH (ref 70–99)

## 2009-12-07 ENCOUNTER — Ambulatory Visit: Payer: Self-pay | Admitting: Internal Medicine

## 2009-12-07 ENCOUNTER — Encounter: Payer: Self-pay | Admitting: Internal Medicine

## 2009-12-12 ENCOUNTER — Encounter: Payer: Self-pay | Admitting: Internal Medicine

## 2009-12-12 ENCOUNTER — Ambulatory Visit: Payer: Self-pay

## 2010-01-02 ENCOUNTER — Telehealth: Payer: Self-pay | Admitting: Internal Medicine

## 2010-01-14 ENCOUNTER — Ambulatory Visit: Payer: Self-pay | Admitting: Internal Medicine

## 2010-01-16 ENCOUNTER — Encounter: Payer: Self-pay | Admitting: Internal Medicine

## 2010-01-16 DIAGNOSIS — E211 Secondary hyperparathyroidism, not elsewhere classified: Secondary | ICD-10-CM | POA: Insufficient documentation

## 2010-01-16 LAB — CONVERTED CEMR LAB
BUN: 18 mg/dL (ref 6–23)
Calcium: 9.3 mg/dL (ref 8.4–10.5)
Chloride: 115 meq/L — ABNORMAL HIGH (ref 96–112)
Glucose, Bld: 165 mg/dL — ABNORMAL HIGH (ref 70–99)
Potassium: 5 meq/L (ref 3.5–5.3)

## 2010-01-17 ENCOUNTER — Telehealth: Payer: Self-pay | Admitting: Internal Medicine

## 2010-01-17 LAB — CONVERTED CEMR LAB: Vit D, 25-Hydroxy: 12 ng/mL — ABNORMAL LOW (ref 30–89)

## 2010-01-24 ENCOUNTER — Encounter: Admission: RE | Admit: 2010-01-24 | Discharge: 2010-01-24 | Payer: Self-pay | Admitting: Internal Medicine

## 2010-01-24 LAB — HM MAMMOGRAPHY: HM Mammogram: NEGATIVE

## 2010-01-29 ENCOUNTER — Encounter: Payer: Self-pay | Admitting: Internal Medicine

## 2010-02-04 ENCOUNTER — Telehealth: Payer: Self-pay | Admitting: *Deleted

## 2010-02-07 ENCOUNTER — Ambulatory Visit: Payer: Self-pay | Admitting: Vascular Surgery

## 2010-02-18 ENCOUNTER — Encounter (INDEPENDENT_AMBULATORY_CARE_PROVIDER_SITE_OTHER): Payer: Self-pay | Admitting: Internal Medicine

## 2010-02-18 ENCOUNTER — Ambulatory Visit (HOSPITAL_COMMUNITY): Admission: RE | Admit: 2010-02-18 | Discharge: 2010-02-18 | Payer: Self-pay | Admitting: Internal Medicine

## 2010-02-18 ENCOUNTER — Ambulatory Visit: Payer: Self-pay | Admitting: Internal Medicine

## 2010-02-18 ENCOUNTER — Encounter: Payer: Self-pay | Admitting: Internal Medicine

## 2010-02-18 ENCOUNTER — Telehealth: Payer: Self-pay | Admitting: *Deleted

## 2010-02-18 ENCOUNTER — Inpatient Hospital Stay (HOSPITAL_COMMUNITY): Admission: AD | Admit: 2010-02-18 | Discharge: 2010-02-25 | Payer: Self-pay | Admitting: Internal Medicine

## 2010-02-22 ENCOUNTER — Encounter: Payer: Self-pay | Admitting: Internal Medicine

## 2010-02-25 ENCOUNTER — Encounter: Payer: Self-pay | Admitting: Internal Medicine

## 2010-03-11 ENCOUNTER — Encounter: Payer: Self-pay | Admitting: Internal Medicine

## 2010-03-11 ENCOUNTER — Telehealth: Payer: Self-pay | Admitting: Internal Medicine

## 2010-03-12 ENCOUNTER — Ambulatory Visit: Payer: Self-pay | Admitting: Internal Medicine

## 2010-03-12 ENCOUNTER — Ambulatory Visit (HOSPITAL_COMMUNITY): Admission: RE | Admit: 2010-03-12 | Discharge: 2010-03-12 | Payer: Self-pay | Admitting: Internal Medicine

## 2010-03-12 LAB — CONVERTED CEMR LAB: Blood Glucose, Fingerstick: 145

## 2010-03-13 LAB — CONVERTED CEMR LAB
Albumin: 3.3 g/dL — ABNORMAL LOW (ref 3.5–5.2)
Alkaline Phosphatase: 96 units/L (ref 39–117)
CO2: 23 meq/L (ref 19–32)
Glucose, Bld: 126 mg/dL — ABNORMAL HIGH (ref 70–99)
Hemoglobin: 10.1 g/dL — ABNORMAL LOW (ref 12.0–15.0)
Lymphocytes Relative: 38 % (ref 12–46)
Lymphs Abs: 2.3 10*3/uL (ref 0.7–4.0)
Monocytes Absolute: 0.5 10*3/uL (ref 0.1–1.0)
Monocytes Relative: 8 % (ref 3–12)
Neutro Abs: 2.9 10*3/uL (ref 1.7–7.7)
Potassium: 5.9 meq/L — ABNORMAL HIGH (ref 3.5–5.3)
RBC: 3.53 M/uL — ABNORMAL LOW (ref 3.87–5.11)
Sodium: 139 meq/L (ref 135–145)
Total Protein: 7 g/dL (ref 6.0–8.3)

## 2010-03-15 ENCOUNTER — Ambulatory Visit: Payer: Self-pay | Admitting: Internal Medicine

## 2010-03-15 DIAGNOSIS — Z950 Presence of cardiac pacemaker: Secondary | ICD-10-CM | POA: Insufficient documentation

## 2010-03-15 DIAGNOSIS — I440 Atrioventricular block, first degree: Secondary | ICD-10-CM | POA: Insufficient documentation

## 2010-03-15 LAB — CONVERTED CEMR LAB
BUN: 21 mg/dL (ref 6–23)
Creatinine, Ser: 2.11 mg/dL — ABNORMAL HIGH (ref 0.40–1.20)
Potassium: 4.5 meq/L (ref 3.5–5.3)
Sodium: 142 meq/L (ref 135–145)

## 2010-03-18 ENCOUNTER — Ambulatory Visit: Payer: Self-pay | Admitting: Internal Medicine

## 2010-03-19 LAB — CONVERTED CEMR LAB
Albumin: 3.2 g/dL — ABNORMAL LOW (ref 3.5–5.2)
BUN: 17 mg/dL (ref 6–23)
Calcium: 8.9 mg/dL (ref 8.4–10.5)
Creatinine, Urine: 134.4 mg/dL
Crystals: NONE SEEN
Ketones, ur: NEGATIVE mg/dL
Leukocytes, UA: NEGATIVE
Microalb, Ur: 73.45 mg/dL — ABNORMAL HIGH (ref 0.00–1.89)
Nitrite: NEGATIVE
Phosphorus: 4.3 mg/dL (ref 2.3–4.6)
Potassium: 4.2 meq/L (ref 3.5–5.3)
Protein, ur: 100 mg/dL — AB
Sodium: 143 meq/L (ref 135–145)
Specific Gravity, Urine: 1.014 (ref 1.005–1.0)
Urobilinogen, UA: 0.2 (ref 0.0–1.0)

## 2010-04-10 ENCOUNTER — Ambulatory Visit: Payer: Self-pay | Admitting: Internal Medicine

## 2010-04-10 ENCOUNTER — Encounter: Payer: Self-pay | Admitting: Internal Medicine

## 2010-04-10 LAB — CONVERTED CEMR LAB
Blood Glucose, Fingerstick: 120
Chloride: 110 meq/L (ref 96–112)
Potassium: 4.4 meq/L (ref 3.5–5.3)
Sodium: 141 meq/L (ref 135–145)

## 2010-04-24 ENCOUNTER — Encounter: Payer: Self-pay | Admitting: Internal Medicine

## 2010-04-24 ENCOUNTER — Ambulatory Visit: Payer: Self-pay | Admitting: Internal Medicine

## 2010-04-29 LAB — CONVERTED CEMR LAB
BUN: 42 mg/dL — ABNORMAL HIGH (ref 6–23)
CO2: 17 meq/L — ABNORMAL LOW (ref 19–32)
Chloride: 110 meq/L (ref 96–112)
Glucose, Bld: 99 mg/dL (ref 70–99)
Potassium: 4.7 meq/L (ref 3.5–5.3)
Sodium: 139 meq/L (ref 135–145)

## 2010-05-02 ENCOUNTER — Telehealth: Payer: Self-pay | Admitting: Internal Medicine

## 2010-05-06 ENCOUNTER — Encounter: Payer: Self-pay | Admitting: Internal Medicine

## 2010-05-06 ENCOUNTER — Ambulatory Visit: Payer: Self-pay | Admitting: Internal Medicine

## 2010-05-07 LAB — CONVERTED CEMR LAB
BUN: 27 mg/dL — ABNORMAL HIGH (ref 6–23)
CO2: 22 meq/L (ref 19–32)
Calcium: 8.7 mg/dL (ref 8.4–10.5)
Glucose, Bld: 77 mg/dL (ref 70–99)

## 2010-05-14 ENCOUNTER — Encounter: Payer: Self-pay | Admitting: Internal Medicine

## 2010-05-14 ENCOUNTER — Ambulatory Visit: Payer: Self-pay | Admitting: Internal Medicine

## 2010-05-15 ENCOUNTER — Telehealth: Payer: Self-pay | Admitting: *Deleted

## 2010-05-15 LAB — CONVERTED CEMR LAB
Calcium: 8.9 mg/dL (ref 8.4–10.5)
Creatinine, Ser: 1.68 mg/dL — ABNORMAL HIGH (ref 0.40–1.20)
Glucose, Bld: 141 mg/dL — ABNORMAL HIGH (ref 70–99)
Sodium: 140 meq/L (ref 135–145)

## 2010-05-22 ENCOUNTER — Ambulatory Visit: Payer: Self-pay | Admitting: Internal Medicine

## 2010-05-22 LAB — CONVERTED CEMR LAB: Blood Glucose, Fingerstick: 93

## 2010-06-26 ENCOUNTER — Ambulatory Visit: Payer: Self-pay | Admitting: Internal Medicine

## 2010-07-03 ENCOUNTER — Encounter: Payer: Self-pay | Admitting: Internal Medicine

## 2010-07-08 ENCOUNTER — Telehealth: Payer: Self-pay | Admitting: Internal Medicine

## 2010-07-19 ENCOUNTER — Encounter: Payer: Self-pay | Admitting: Internal Medicine

## 2010-07-23 ENCOUNTER — Encounter: Payer: Self-pay | Admitting: Internal Medicine

## 2010-07-24 ENCOUNTER — Ambulatory Visit: Payer: Self-pay | Admitting: Internal Medicine

## 2010-07-24 LAB — CONVERTED CEMR LAB: Blood Glucose, Fingerstick: 155

## 2010-07-31 ENCOUNTER — Encounter: Payer: Self-pay | Admitting: Internal Medicine

## 2010-08-05 ENCOUNTER — Telehealth: Payer: Self-pay | Admitting: Internal Medicine

## 2010-08-20 ENCOUNTER — Encounter: Payer: Self-pay | Admitting: Internal Medicine

## 2010-08-21 ENCOUNTER — Telehealth: Payer: Self-pay | Admitting: *Deleted

## 2010-09-25 ENCOUNTER — Ambulatory Visit: Payer: Self-pay | Admitting: Vascular Surgery

## 2010-09-25 ENCOUNTER — Telehealth (INDEPENDENT_AMBULATORY_CARE_PROVIDER_SITE_OTHER): Payer: Self-pay | Admitting: *Deleted

## 2010-10-20 ENCOUNTER — Encounter: Payer: Self-pay | Admitting: *Deleted

## 2010-10-29 NOTE — Discharge Summary (Signed)
Summary: Hospital Discharge Update (complicated pneumonia) Appt 06/09/    Hospital Discharge Update:  Date of Admission: 02/18/2010 Date of Discharge: 02/25/2010  Brief Summary:  RUL pneumonia- septic shock, no repsiratory assistance required, BP responded to fluids. Discharged on avelox.  Lab or other results pending at discharge:  Blood Cx   Labs needed at follow-up: CBC with differential, Comprehensive metabolic panel  Other labs needed at follow-up: Mg.  Other follow-up issues:  CXR- resolution on pneumonia WBC a little high at d/c. afebrile. Follow with CBC BP meds restarted at home dose. Follow Low bicarbonate in hospital with no acidosis on ABG. Follow with HCO3 LFTs trending down at d/c. Elevated possbily 2/2 sepsis. Follow with CMET Hypokalemic and hypomagnesemic in hospital.  Follow in [facility]. No diuretics, KCL supplementation at d/c  Problem list changes:  Added new problem of PNEUMONIA, RIGHT UPPER LOBE (ICD-486) - Signed  Medication list changes:  Added new medication of AVELOX 400 MG TABS (MOXIFLOXACIN HCL) Take 1 tablet by mouth once a day for 1 week starting tomorrow - Signed Added new medication of POTASSIUM CHLORIDE CR 10 MEQ CR-TABS (POTASSIUM CHLORIDE) Take 1 tablet by mouth once a day - Signed Rx of AVELOX 400 MG TABS (MOXIFLOXACIN HCL) Take 1 tablet by mouth once a day for 1 week starting tomorrow;  #7 x 0;  Signed;  Entered by: Lester Macoupin MD;  Authorized by: Lester [place] MD;  Method used: Electronically to [id number] [address] [place], [place], [place]  [address], Ph: [phone], Fax: [phone] Rx of POTASSIUM CHLORIDE CR 10 MEQ CR-TABS (POTASSIUM CHLORIDE) Take 1 tablet by mouth once a day;  #15 x 0;  Signed;  Entered by: Lester [place] MD;  Authorized by: Lester Ripley MD;  Method used: Electronically to [id number] [address], [place], [place], [place]  [address], Ph: [phone], Fax:  [phone]  The medication, problem, and allergy lists have been updated.  Please see the dictated discharge summary for details.  Discharge medications:  LANTUS  SOLN (INSULIN GLARGINE SOLN) 10 units sq at bedtime ZOCOR 40 MG  TABS (SIMVASTATIN) Take 1 tab by mouth at bedtime ASPIRIN 81 MG CHEW (ASPIRIN) once daily NOVOLIN R 100 UNIT/ML SOLN (INSULIN REGULAR HUMAN) three times a day per sliding scale NEURONTIN 300 MG CAPS (GABAPENTIN) Take 1 tablet by mouth three times a day LORATADINE 10 MG  TABS (LORATADINE) Take 1 tablet by mouth once a day LOMOTIL 2.5-0.025 MG/5ML LIQD (DIPHENOXYLATE-ATROPINE) Take 10 mL by mouth four times a day for diarrhea CLONIDINE HCL 0.1 MG  TABS (CLONIDINE HCL) Take 1 tablet by mouth two times a day TRAVATAN 0.004 %  SOLN (TRAVOPROST) place 1 drop in each eye at bedtime TRUETRACK TEST  STRP (GLUCOSE BLOOD) to test blood sugar twice daily PHENADOZ 12.5 MG SUPP (PROMETHAZINE HCL) insert 1 per rectum every 8 hours as needed for nausea and vomiting COREG 6.25 MG TABS (CARVEDILOL) Take 1 tablet by mouth two times a day IMDUR 30 MG XR24H-TAB (ISOSORBIDE MONONITRATE) Take 1 tablet by mouth once a day NORVASC 10 MG TABS (AMLODIPINE BESYLATE) Take 1 tablet by mouth once a day TUMS 500 MG CHEW (CALCIUM CARBONATE ANTACID) Take 1 tablet by mouth three times a day with meals ERGOCALCIFEROL 50000 UNIT CAPS (ERGOCALCIFEROL) take 1 tablet by mouth weekly for 4 weeks, then monthly AVELOX 400 MG TABS (MOXIFLOXACIN HCL) Take 1 tablet by mouth once a day for 1 week starting  tomorrow POTASSIUM CHLORIDE CR 10 MEQ CR-TABS (POTASSIUM CHLORIDE) Take 1 tablet by mouth once a day  Other patient instructions:  We will call you with an appointment [facility] in a week. Call us at [phone] if you do not hear from [place] You next appointment with [facility] cardiology clinic is on 03/15/2010 at 1:50 pm We have added an antibioitc to your medicines. Prescription sent to your  pharmacy. [facility])     Note: Hospital Discharge Medications & Other Instructions handout was printed, one copy for patient and a second copy to be placed in hospital chart.  Prescriptions: POTASSIUM CHLORIDE CR 10 MEQ CR-TABS (POTASSIUM CHLORIDE) Take 1 tablet by mouth once a day  #15 x 0   Entered and Authorized by:   Lester [place] MD   Signed by:   Lester [place] MD on 02/25/2010   Method used:   Electronically to        [facility] (retail)       [place], [place]  [address]       Ph: [phone]       Fax: [phone]   RxIDHC:[id number] AVELOX 400 MG TABS (MOXIFLOXACIN HCL) Take 1 tablet by mouth once a day for 1 week starting tomorrow  #7 x 0   Entered and Authorized by:   Lester [place] MD   Signed by:   Lester [place] MD on 02/25/2010   Method used:   Electronically to        [facility] (retail)       [address]       [place], [place]  [address]       Ph: [phone]       Fax: [phone]   RxID:   [id number]
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
## 2010-10-29 NOTE — Progress Notes (Signed)
Summary: vit d cost/ hla  Phone Note Call from Patient   Summary of Call: pt's daughter calls to c/o medicaid not paying for vit d, they indeed will not pay for vit d or any other vitamins anymore, pt has paid for first 4 and told pt it will be 4-5 dollars /m hereafter, she is ok w/ this Initial call taken by: Freddy Finner RN,  Feb 04, 2010 5:32 PM

## 2010-10-29 NOTE — Progress Notes (Signed)
Summary: Refill/gh  Phone Note Refill Request Message from:  Fax from Pharmacy on January 02, 2010 2:57 PM  Refills Requested: Medication #1:  Diphen/Atrop 2.5/5 liquid   Last Refilled: 10/12/2009  Method Requested: Fax to [facility] Initial call taken by: Sander Nephew RN,  January 02, 2010 2:58 PM  Follow-up for Phone Call        Rx faxed to pharmacy Follow-up by: Alphia Moh MD,  January 02, 2010 4:10 PM    Prescriptions: LOMOTIL 2.5-0.025 MG/5ML LIQD (DIPHENOXYLATE-ATROPINE) Take 10 mL by mouth four times a day for diarrhea  #600 mL x 6   Entered and Authorized by:   Alphia Moh MD   Signed by:   Alphia Moh MD on 01/02/2010   Method used:   Electronically to        [facility] (retail)       [address] [place], [place]  [address]       Ph: [phone]       Fax: [phone]   RxID:   [id number]

## 2010-10-29 NOTE — Miscellaneous (Signed)
  Clinical Lists Changes  Orders: Added new Test order of T-Basic Metabolic Panel (99991111) - Signed      Problem # 13:  CHRONIC KIDNEY DISEASE STAGE III (MODERATE) (ICD-585.3) order for BMP in 2 weeks See office visit for details  Future Orders: T-Basic Metabolic Panel (99991111) ... 04/24/2010   Complete Medication List: 1)  Lantus Soln (Insulin glargine soln) .Marland Kitchen.. 10 units sq at bedtime 2)  Zocor 40 Mg Tabs (Simvastatin) .... Take 1 tab by mouth at bedtime 3)  Aspirin 81 Mg Chew (Aspirin) .... Once daily 4)  Novolin R 100 Unit/ml Soln (Insulin regular human) .... Three times a day per sliding scale 5)  Neurontin 300 Mg Caps (Gabapentin) .... Take 1 tablet by mouth three times a day 6)  Loratadine 10 Mg Tabs (Loratadine) .... Take 1 tablet by mouth once a day 7)  Lomotil 2.5-0.025 Mg/37ml Liqd (Diphenoxylate-atropine) .... Take 10 ml by mouth four times a day for diarrhea 8)  Travatan 0.004 % Soln (Travoprost) .... Place 1 drop in each eye at bedtime 9)  Truetrack Test Strp (Glucose blood) .... To test blood sugar twice daily 10)  Phenadoz 12.5 Mg Supp (Promethazine hcl) .... Insert 1 per rectum every 8 hours as needed for nausea and vomiting 11)  Coreg 6.25 Mg Tabs (Carvedilol) .... Take 1 tablet by mouth two times a day 12)  Imdur 30 Mg Xr24h-tab (Isosorbide mononitrate) .... Take 1 tablet by mouth once a day 13)  Norvasc 10 Mg Tabs (Amlodipine besylate) .... Take 1 tablet by mouth once a day 14)  Tums 500 Mg Chew (Calcium carbonate antacid) .... Take 1 tablet by mouth three times a day with meals 15)  Ergocalciferol 50000 Unit Caps (Ergocalciferol) .... Take 1 tablet by mouth monthly 16)  Prodigy Insulin Syringe 31g X 5/16" 0.3 Ml Misc (Insulin syringe-needle u-100) .... Use to inject insulin as directed 17)  Lisinopril 40 Mg Tabs (Lisinopril) .... Take 1/2 tablet by mouth once daily   Process Orders Check Orders Results:     [facility]: [id number] not  required for this insurance Order queued for requisitioning for [facility]: April 10, 2010 11:46 AM  Tests Sent for requisitioning (April 10, 2010 11:46 AM):     04/24/2010: [facility] -- T-Basic Metabolic Panel [id number] (signed)

## 2010-10-29 NOTE — Assessment & Plan Note (Signed)
Summary: per Regino Schultze, Meds taking pt. sick [mkj]   Vital Signs:  Patient profile:   60 year old female Height:      62 inches Weight:      164.38 pounds BMI:     30.17 O2 Sat:      99 % on Room air Temp:     97.5 degrees F oral Pulse rate:   50 / minute Resp:     18 per minute BP sitting:   158 / 66  (left arm)  Vitals Entered By: Gillian Scarce, RN (October 29, 2009 3:36 PM)  O2 Flow:  Room air   CC: c/o passing out and vomiting for 1 month, patient states she vomits after taking lisinopril so she has not taken it in 2 days Is Patient Diabetic? Yes Did you bring your meter with you today? No Pain Assessment Patient in pain? no      Nutritional Status BMI of > 30 = obese  Have you ever been in a relationship where you felt threatened, hurt or afraid?No   Does patient need assistance? Functional Status Self care Ambulation Impaired:Risk for fall   Primary Care Provider:  Alphia Moh MD  CC:  c/o passing out and vomiting for 1 month and patient states she vomits after taking lisinopril so she has not taken it in 2 days.  History of Present Illness: She has had nausea, and vomiting for last 2 months then she pass out. She denies abdominal pain. The episode are also accompanied by shaking. She has had also urinary incontinence after syncope event. She denies stool incontinence. She has had lost conciousness, twices this week, episode last 5 mints. The last time was saturday. She stand up, feel lightheadness and then she pass out. She denies chest pain, or shortness of breath. She does feel palpitation prior to episodes. She has had this episode for the last 2 months now, her syster says that is getting more frequent. Her syster relates that after syncope event  she is confused for 3  to 5 minutes.   Preventive Screening-Counseling & Management  Alcohol-Tobacco     Alcohol type: BEER     Smoking Status: current     Smoking Cessation Counseling: yes     Packs/Day: 1  cig per day.     Year Started: 25 years ago  Allergies: 1)  ! Codeine 2)  ! * Davocet 3)  ! Hydrocodone  Review of Systems  The patient denies chest pain, dyspnea on exertion, abdominal pain, melena, and hematochezia.    Physical Exam  General:  alert and well-developed.   Head:  normocephalic and atraumatic.   Lungs:  normal respiratory effort, no intercostal retractions, no accessory muscle use, and normal breath sounds.   Heart:  no murmur and irregular rhythm.   Abdomen:  soft, non-tender, normal bowel sounds, and no distention.   Extremities:  no edema. Neurologic:  alert & oriented X3, cranial nerves II-XII intact, sensation intact to light touch, and finger-to-nose normal.     Impression & Recommendations:  Problem # 1:  SYNCOPE (ICD-780.2) The differential for syncope event : Arrythmia highly in the differential she relates palpitation prior to episodes, prior history of cardiac arrest. Also Vasovagal , episode happens after vomiting. Seizure, history of confusion after event.  Hypoglecemia, she relates shaking prior to event. Stroke, vertebral artery insuficency. Orthostatic hypotension. Will admitt to teletmetry. Will cycle cardiac enzimes, EKG, Orthostatic vitals. She might need EEG if lab work negative and no  orthostatic.  Orders: T-Hgb A1C (in-house) [phone]) T- Capillary Blood Glucose [id number]) T-Comprehensive Metabolic Panel ([id number]) T-CBC w/Diff [id number]) T-Troponin I [id number]) T-CK MB Fract ([id number]) T-CK Total [id number]) 12 Lead EKG (12 Lead EKG)  Complete Medication List: 1)  Lantus Soln (Insulin glargine soln) .... 20 units at bedtime 2)  Zocor 40 Mg Tabs (Simvastatin) .... Take 1 tab by mouth at bedtime 3)  Imdur 30 Mg Tb24 (Isosorbide mononitrate) .... Take 1 tablet by mouth once a day 4)  Aspirin 81 Mg Chew (Aspirin) .... Once daily 5)  Furosemide 40 Mg Tabs (Furosemide) .... Take 1 tablet by mouth once a day 6)  Lisinopril 40 Mg  Tabs (Lisinopril) .... Take 1 tablet by mouth once a day 7)  Carvedilol 12.5 Mg Tabs (Carvedilol) .... Take 1 tablet by mouth two times a day 8)  Novolin R 100 Unit/ml Soln (Insulin regular human) .... Three times a day per sliding scale 9)  Neurontin 300 Mg Caps (Gabapentin) .... Take 1 tablet by mouth three times a day 10)  Loratadine 10 Mg Tabs (Loratadine) .... Take 1 tablet by mouth once a day 11)  Lomotil 2.5-0.025 Mg/75ml Liqd (Diphenoxylate-atropine) .... Take 10 ml by mouth four times a day for diarrhea 12)  Amlodipine Besylate 10 Mg Tabs (Amlodipine besylate) .... Take 1 tablet by mouth once a day 13)  Clonidine Hcl 0.1 Mg Tabs (Clonidine hcl) .... Take 1 tablet by mouth two times a day 14)  Travatan 0.004 % Soln (Travoprost) .... Place 1 drop in each eye at bedtime 15)  Truetrack Test Strp (Glucose blood) .... To test blood sugar twice daily 16)  Potassium Citrate 5 Meq (540 Mg) Cr-tabs (Potassium citrate) .... Take 1 tablet by mouth three times a day Process Orders Check Orders Results:     [facility]: [id number] not required for this insurance Tests Sent for requisitioning (October 29, 2009 4:30 PM):     10/29/2009: [facility] -- T-Comprehensive Metabolic Panel [id number] (signed)     10/29/2009: [facility] -- T-CBC w/Diff [id number] (signed)     10/29/2009: [facility] -- T-Troponin I [id number] (signed)     10/29/2009: [facility] -- T-CK MB Fract [phone] (signed)     10/29/2009: [facility] -- T-CK Total [[id number]] (signed)    Prevention & Chronic Care Immunizations   Influenza vaccine: Fluvax Non-MCR  (08/06/2009)   Influenza vaccine deferral: Refused  (07/09/2009)    Tetanus booster: 08/06/2009: Td   Td booster deferral: Refused  (07/09/2009)    Pneumococcal vaccine: Not documented  Colorectal Screening   Hemoccult: Not documented    Colonoscopy: Not  documented  Other Screening   Pap smear: Not documented   Pap smear action/deferral: PAP smear not indicated at this time  (11/26/2006)    Mammogram: ASSESSMENT: Negative - BI-RADS 1^MM DIGITAL SCREENING  (01/23/2009)   Mammogram action/deferral: mammogram ordered  (11/26/2006)   Mammogram due: 12/2008   Smoking status: current  (10/29/2009)   Smoking cessation counseling: yes  (10/29/2009)  Diabetes Mellitus   HgbA1C: 5.8  (06/22/2009)   HgbA1C action/deferral: Ordered  (10/29/2009)    Eye exam: No diabetic retinopathy.   Glaucoma:  well controlled Cataracts, subcapsular     (07/10/2009)   Eye exam due: 09/2009    Foot exam: Not documented   High risk foot: Not documented   Foot care education: Not documented    Urine microalbumin/creatinine ratio: 633.9  (04/11/2008)  Lipids   Total Cholesterol: 151  (  07/09/2009)   Lipid panel action/deferral: Lipid Panel ordered   LDL: 63  (07/09/2009)   LDL Direct: Not documented   HDL: 67  (07/09/2009)   Triglycerides: 106  (07/09/2009)    SGOT (AST): 19  (07/09/2009)   BMP action: Ordered   SGPT (ALT): 13  (07/09/2009) CMP ordered    Alkaline phosphatase: 112  (07/09/2009)   Total bilirubin: 0.9  (07/09/2009)  Hypertension   Last Blood Pressure: 158 / 66  (10/29/2009)   Serum creatinine: 1.74  (08/06/2009)   BMP action: Ordered   Serum potassium 4.9  (08/06/2009) CMP ordered     Hypertension flowsheet reviewed?: Yes  Self-Management Support :   Personal Goals (by the next clinic visit) :     Personal A1C goal: 6  (06/22/2009)     Personal blood pressure goal: 130/80  (06/22/2009)     Personal LDL goal: 100  (06/22/2009)    Patient will work on the following items until the next clinic visit to reach self-care goals:     Medications and monitoring: take my medicines every day, check my blood sugar  (10/29/2009)     Eating: eat baked foods instead of fried foods  (10/29/2009)    Diabetes self-management support:  Written self-care plan  (08/06/2009)    Hypertension self-management support: Written self-care plan  (08/06/2009)    Lipid self-management support: Written self-care plan  (08/06/2009)    Nursing Instructions: HgbA1C today (see order) CBG today (see order)    Process Orders Check Orders Results:     [facility]: ABN not required for this insurance Tests Sent for requisitioning (October 29, 2009 4:30 PM):     10/29/2009: [facility] -- T-Comprehensive Metabolic Panel [id number] (signed)     10/29/2009: [facility] -- T-CBC w/Diff [id number] (signed)     10/29/2009: [facility] -- T-Troponin I [id number] (signed)     10/29/2009: [facility] -- T-CK MB Fract [id number] (signed)     10/29/2009: [facility] -- T-CK Total [[id number]] (signed)   Appended Document: Lab Order/aic results cbg    Lab Visit  Laboratory Results   Blood Tests   Date/Time Received: October 29, 2009 5:02 PM Date/Time Reported: Maryan Rued  October 29, 2009 5:03 PM   HGBA1C: 6.1%   (Normal Range: Non-Diabetic - 3-6%   Control Diabetic - 6-8%) CBG Random:: 59mg /dL    Orders Today:   Appended Document: per Regino Schultze, Meds taking pt. sick [mkj]    Nurse Visit   Vitals Entered By: Yvonna Alanis RN (October 29, 2009 5:32 PM)  Serial Vital Signs/Assessments:  Time      Position  BP       Pulse  Resp  Temp     By 5:15 P    Lying LA  182/78   55                    Yvonna Alanis RN 5: 20 P   Sitting   184/79   55                    Yvonna Alanis RN  CC: venous blood sugar of 59 - pt given tube of glucose 15 gm at approximately 5 P - EKG and orthostatic b/p done - attempt to start IV in left and rigth hand unsuccessful  by Yvonna Alanis, RN- lab drew blood from left hand. IV team here to start IV to right inner forearm with  22 G 1 in IW CBG Result 54 Comments Pt's CBG at 5:35P 54,- pt given  2nd tube of 15 gm of glucose gel and IV started of D5NS 500 cc at 999cc/hr per V. O. Dr. Charna Archer Powers RN  October 29, 2009 5:46 PM  IV D5 NS bollus started at 5:45 PM.  Pt tolerating well.  Talking.  Up to bathroom.  Report called to nurse on 2000.  Pt transported via wheelchair to 2024.  Friend in attendance. Sander Nephew RN  October 29, 2009 6:02 PM   Allergies: 1)  ! Codeine 2)  ! * Davocet 3)  ! Hydrocodone Laboratory Results   Blood Tests     CBG Random:: 54mg /dL     Orders Added: 1)  Capillary Blood Glucose/CBG [[id number]]  Report called to 2024 nurse by Sander Nephew, RN. Pt transported via W/C in stable condition, with IV in tact and family member at side to 2024 at appraoximately 6:15 PM   Yvonna Alanis RN  October 29, 2009 6:28 PM

## 2010-10-29 NOTE — Letter (Signed)
Summary: [facility]N  [facility]   Imported By: Enedina Finner 07/05/2010 12:42:50  _____________________________________________________________________  External Attachment:    Type:   Image     Comment:   External Document

## 2010-10-29 NOTE — Assessment & Plan Note (Signed)
Summary: acute/alvarez/add on per gayle sick visit/ch  Admission H and P  1st contact:  Dr. Kelton Pillar [phone] 2nd contact: Dr. Dyann Kief [phone]  Nights/Weekends: 1st contact: [phone] 2nd contact: [phone]    Vital Signs:  Patient profile:   60 year old female Height:      62 inches Weight:      164.4 pounds BMI:     30.18 O2 Sat:      96 % on Room air Temp:     104.9 degrees F oral Pulse rate:   69 / minute BP sitting:   152 / 65  (right arm)  Vitals Entered By: Silverio Decamp NT II (Feb 18, 2010 9:54 AM)  O2 Flow:  Room air CC: running fever since last week,  Nutritional Status BMI of > 30 = obese  Have you ever been in a relationship where you felt threatened, hurt or afraid?No   Does patient need assistance? Functional Status Self care Ambulation Normal   Primary Care Provider:  Alphia Moh MD  CC:  running fever since last week and .  History of Present Illness: 60yo with pmh DM, CAD, pacer placement in 2/11 for sinus arrest, CKD with 2ndary hyperparathyroid presents as a walk in with nausea, fever and abd pain.  She reports a week of nausea with decreased by mouth intake.  She has had only one episode of vomiting, no diarrhea, last BM was 3 days ago.  She feels fatigued, nauseated and has significant pain in the left uppper quadrant.  Pain is worse with eating or drinking anything, is constant and is not relieved by anything.  She has had a fever on and off at home up to 103, was taking tylenol last week which helped a little.  She has not had any sick contacts or been in any healthcare settings.  She has good urine output with no dysuria.  She has a cough which is mild and non productive, no chest pain.  She is wheesing on exam but denies shortness of breath.  Preventive Screening-Counseling & Management  Alcohol-Tobacco     Alcohol type: BEER     Smoking Status: current     Smoking Cessation Counseling: yes     Packs/Day: 1 cig per day.     Year Started: 25  years ago  Caffeine-Diet-Exercise     Does Patient Exercise: no  Current Medications (verified): 1)  Lantus  Soln (Insulin Glargine Soln) .Marland Kitchen.. 10 Units Sq At Bedtime 2)  Zocor 40 Mg  Tabs (Simvastatin) .... Take 1 Tab By Mouth At Bedtime 3)  Aspirin 81 Mg Chew (Aspirin) .... Once Daily 4)  Novolin R 100 Unit/ml Soln (Insulin Regular Human) .... Three Times A Day Per Sliding Scale 5)  Neurontin 300 Mg Caps (Gabapentin) .... Take 1 Tablet By Mouth Three Times A Day 6)  Loratadine 10 Mg  Tabs (Loratadine) .... Take 1 Tablet By Mouth Once A Day 7)  Lomotil 2.5-0.025 Mg/68ml Liqd (Diphenoxylate-Atropine) .... Take 10 Ml By Mouth Four Times A Day For Diarrhea 8)  Clonidine Hcl 0.1 Mg  Tabs (Clonidine Hcl) .... Take 1 Tablet By Mouth Two Times A Day 9)  Travatan 0.004 %  Soln (Travoprost) .... Place 1 Drop in Each Eye At Bedtime 10)  Truetrack Test  Strp (Glucose Blood) .... To Test Blood Sugar Twice Daily 11)  Phenadoz 12.5 Mg Supp (Promethazine Hcl) .... Insert 1 Per Rectum Every 8 Hours As Needed For Nausea and Vomiting 12)  Coreg 6.25  Mg Tabs (Carvedilol) .... Take 1 Tablet By Mouth Two Times A Day 13)  Imdur 30 Mg Xr24h-Tab (Isosorbide Mononitrate) .... Take 1 Tablet By Mouth Once A Day 14)  Norvasc 10 Mg Tabs (Amlodipine Besylate) .... Take 1 Tablet By Mouth Once A Day 15)  Tums 500 Mg Chew (Calcium Carbonate Antacid) .... Take 1 Tablet By Mouth Three Times A Day With Meals 16)  Ergocalciferol 50000 Unit Caps (Ergocalciferol) .... Take 1 Tablet By Mouth Weekly For 4 Weeks, Then Monthly  Allergies (verified): 1)  ! Codeine 2)  ! * Davocet 3)  ! Hydrocodone  Family History: sister with CAD in her 66's mother died of heart problems at 44  Social History: LIves with her sister disability for heart disease and DM smokes 1/4ppd since age of 8 no ETOH or other drug use  Review of Systems       per hpi  Physical Exam  General:  fatigued and uncomfortable. Head:  normocephalic and  atraumatic.   Eyes:  vision grossly intact, pupils equal, pupils round, and pupils reactive to light. Mouth:  mucus membranes dry, pink, no oral erythema Neck:  supple, full ROM, no masses, and no thyromegaly.   Chest Wall:  pacer is palpable, no tenderness, warmth or erythema Lungs:  scattered wheeses throughout with good air movement Heart:  RRR no mrg Abdomen:  BS decreased, diffuse tenderness with gaurding in the LUQ and epigastric area, no mass, not distended Pulses:  +1 Extremities:  no edema Neurologic:  fatigued, oriented, CN II-XII intact, weak laying on exam table Skin:  no suspicious lesions.   Cervical Nodes:  no anterior cervical adenopathy and no posterior cervical adenopathy.   Psych:  Oriented X3 and memory intact for recent and remote.  subdued and uncomfortable.   Impression & Recommendations:  Problem # 1:  LUQ PAIN (ICD-789.02) Suspect that this is an acute viral gastroenteritis with such a high fever.  Other possibilities include diverticulitis, bowel ischemia, PUD (performation?), pacer pocket infection (does not feel infected on exam), UTI/pyelo... differential is still broad.  With her CRI and DM  and decreased by mouth intake I am concerned about dehydration. BP is fine and orthostatics are negative, but she feels dizzy and weak.  She has lost 6 lbs in the past month. Will check standard abdominal labs STAT.  Will let the admitting team decide on CT abdomen or not, would probably be a good idea, need to see today's creatinine to see if could use contrast.  Orders: T-CMP with estimated GFR ([id number]) T-CBC w/Diff [id number]) T-Lipase [id number]) T-Urinalysis [id number]) T-Troponin I [id number]) T-CK Total [id number]) T-CK MB Fract ([id number]) 12 Lead EKG (12 Lead EKG) T-Culture, Blood Routine [id number]) T-Culture, Blood Routine GI:[id number])  Problem # 2:  COUGH (ICD-786.2) She is wheesing today without complaint of SOA.  She does report a new  nonproductive cough.  She has hx of CAD will check BNP, enzymes, EKG and a CXR.  I expect these to be negative given symptoms for one week and high fever, but she is female, diabetic and could present atypically for ACS/HF.  Orders: CXR- 2view (CXR) T-BNP  (B Natriuretic Peptide) [id number])  Complete Medication List: 1)  Lantus Soln (Insulin glargine soln) .Marland Kitchen.. 10 units sq at bedtime 2)  Zocor 40 Mg Tabs (Simvastatin) .... Take 1 tab by mouth at bedtime 3)  Aspirin 81 Mg Chew (Aspirin) .... Once daily 4)  Novolin R 100 Unit/ml Soln (Insulin regular human) .... Three times a day  per sliding scale 5)  Neurontin 300 Mg Caps (Gabapentin) .... Take 1 tablet by mouth three times a day 6)  Loratadine 10 Mg Tabs (Loratadine) .... Take 1 tablet by mouth once a day 7)  Lomotil 2.5-0.025 Mg/63ml Liqd (Diphenoxylate-atropine) .... Take 10 ml by mouth four times a day for diarrhea 8)  Clonidine Hcl 0.1 Mg Tabs (Clonidine hcl) .... Take 1 tablet by mouth two times a day 9)  Travatan 0.004 % Soln (Travoprost) .... Place 1 drop in each eye at bedtime 10)  Truetrack Test Strp (Glucose blood) .... To test blood sugar twice daily 11)  Phenadoz 12.5 Mg Supp (Promethazine hcl) .... Insert 1 per rectum every 8 hours as needed for nausea and vomiting 12)  Coreg 6.25 Mg Tabs (Carvedilol) .... Take 1 tablet by mouth two times a day 13)  Imdur 30 Mg Xr24h-tab (Isosorbide mononitrate) .... Take 1 tablet by mouth once a day 14)  Norvasc 10 Mg Tabs (Amlodipine besylate) .... Take 1 tablet by mouth once a day 15)  Tums 500 Mg Chew (Calcium carbonate antacid) .... Take 1 tablet by mouth three times a day with meals 16)  Ergocalciferol 50000 Unit Caps (Ergocalciferol) .... Take 1 tablet by mouth weekly for 4 weeks, then monthly

## 2010-10-29 NOTE — Progress Notes (Signed)
Summary: med refill/gp  Phone Note Refill Request Message from:  Fax from Pharmacy on August 21, 2010 10:13 AM  Refills Requested: Medication #1:  LOMOTIL 2.5-0.025 MG/5ML LIQD Take 10 mL by mouth four times a day for diarrhea   Last Refilled: 05/22/2010 Last appt. Oct 26.   Method Requested: Electronic Initial call taken by: Morrison Old RN,  August 21, 2010 10:13 AM    Prescriptions: LOMOTIL 2.5-0.025 MG/5ML LIQD (DIPHENOXYLATE-ATROPINE) Take 10 mL by mouth four times a day for diarrhea  #660ml x 0   Entered and Authorized by:   Burman Freestone MD   Signed by:   Burman Freestone MD on 08/21/2010   Method used:   Electronically to        [id number] (retail)       [address] [place],   [address]       Ph: [phone]       Fax: [phone]   RxID:   [id number]
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
## 2010-10-29 NOTE — Miscellaneous (Signed)
Summary: [facility]   [facility]   Imported By: Garlan Fillers 03/18/2010 09:01:37  _____________________________________________________________________  External Attachment:    Type:   Image     Comment:   External Document

## 2010-10-29 NOTE — Consult Note (Signed)
Summary: [facility]  [facility]   Imported By: Lacy Duverney 07/31/2010 15:17:55  _____________________________________________________________________  External Attachment:    Type:   Image     Comment:   External Document
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
## 2010-10-29 NOTE — Assessment & Plan Note (Signed)
Summary: EST-1 MONTH F/U VISIT/CH   Vital Signs:  Patient profile:   60 year old female Height:      62 inches  Vitals Entered By: Sander Nephew RN (June 26, 2010 9:11 AM)  Allergies: 1)  ! Codeine 2)  ! * Davocet 3)  ! Hydrocodone   Complete Medication List: 1)  Lantus Soln (Insulin glargine soln) .Marland Kitchen.. 10 units sq at bedtime 2)  Zocor 40 Mg Tabs (Simvastatin) .... Take 1 tab by mouth at bedtime 3)  Aspirin 81 Mg Chew (Aspirin) .... Once daily 4)  Novolin R 100 Unit/ml Soln (Insulin regular human) .... Three times a day per sliding scale 5)  Neurontin 300 Mg Caps (Gabapentin) .... Take 1 tablet by mouth three times a day 6)  Loratadine 10 Mg Tabs (Loratadine) .... Take 1 tablet by mouth once a day 7)  Lomotil 2.5-0.025 Mg/13ml Liqd (Diphenoxylate-atropine) .... Take 10 ml by mouth four times a day for diarrhea 8)  Travatan 0.004 % Soln (Travoprost) .... Place 1 drop in each eye at bedtime 9)  Truetrack Test Strp (Glucose blood) .... To test blood sugar twice daily 10)  Phenadoz 12.5 Mg Supp (Promethazine hcl) .... Insert 1 per rectum every 8 hours as needed for nausea and vomiting 11)  Coreg 6.25 Mg Tabs (Carvedilol) .... Take 1 tablet by mouth two times a day 12)  Imdur 30 Mg Xr24h-tab (Isosorbide mononitrate) .... Take 1 tablet by mouth once a day 13)  Norvasc 10 Mg Tabs (Amlodipine besylate) .... Take 1 tablet by mouth once a day 14)  Tums 500 Mg Chew (Calcium carbonate antacid) .... Take 1 tablet by mouth three times a day with meals 15)  Ergocalciferol 50000 Unit Caps (Ergocalciferol) .... Take 1 tablet by mouth monthly 16)  Prodigy Insulin Syringe 31g X 5/16" 0.3 Ml Misc (Insulin syringe-needle u-100) .... Use to inject insulin as directed 17)  Furosemide 40 Mg Tabs (Furosemide) .... Take 1 tablet by mouth once a day  Other Orders: Influenza Vaccine NON MCR [id number])   Immunizations Administered:  Influenza Vaccine # 1:    Vaccine Type: Fluvax Non-MCR    Site: right  deltoid    Mfr: GlaxoSmithKline    Dose: 0.5 ml    Route: IM    Given by: Sander Nephew RN    Exp. Date: 03/29/2011    Lot #: [id number]    VIS given: 04/23/10 version given June 26, 2010.  Flu Vaccine Consent Questions:    Do you have a history of severe allergic reactions to this vaccine? no    Any prior history of allergic reactions to egg and/or gelatin? no    Do you have a sensitivity to the preservative Thimersol? no    Do you have a past history of Guillan-Barre Syndrome? no    Do you currently have an acute febrile illness? no    Have you ever had a severe reaction to latex? no    Vaccine information given and explained to patient? yes    Are you currently pregnant? no

## 2010-10-29 NOTE — Letter (Signed)
Summary: [facility]   Imported By: Enedina Finner 08/20/2010 14:29:19  _____________________________________________________________________  External Attachment:    Type:   Image     Comment:   External Document

## 2010-10-29 NOTE — Letter (Signed)
Summary: BLOOD GLUCOSE 04-12-2010-07-24-2010  BLOOD GLUCOSE 04-12-2010-07-24-2010   Imported By: Garlan Fillers 07/31/2010 14:16:12  _____________________________________________________________________  External Attachment:    Type:   Image     Comment:   External Document

## 2010-10-29 NOTE — Progress Notes (Signed)
Summary: returning call   Phone Note Call from Patient Call back at [phone]   Caller: Other Relative/Sister [place] Reason for Call: Talk to Nurse Summary of Call: returning call Initial call taken by: Darnell Level,  December 04, 2009 8:59 AM  Follow-up for Phone Call        Pt aware of lab results Follow-up by: Barnett Abu, RN, BSN,  December 04, 2009 10:00 AM

## 2010-10-29 NOTE — Cardiovascular Report (Signed)
Summary: Office Visit   Office Visit   Imported By: Sallee Provencal 12/20/2009 10:09:43  _____________________________________________________________________  External Attachment:    Type:   Image     Comment:   External Document

## 2010-10-29 NOTE — Progress Notes (Signed)
Summary: Lisinopril  Phone Note Outgoing Call   Call placed by: Sander Nephew RN,  May 02, 2010 4:37 PM Call placed to: Patient Summary of Call: RTC to pt to inform her of the need to stop her Lisinopril.  Pt is also coming in for an appointment on Monday and for lab work.   Pt voiced an understanding of the plan. Sander Nephew RN  May 02, 2010 4:38 PM  Initial call taken by: Sander Nephew RN,  May 02, 2010 4:38 PM

## 2010-10-29 NOTE — Progress Notes (Signed)
Summary: Prescription  Phone Note Outgoing Call   Caller: Patient Call placed by: Sander Nephew RN,  January 17, 2010 11:01 AM Call placed to: Patient Summary of Call: Pt was called and informed that she needs to start taking Vit D pills.  Pt was told that she will take 1 pill weekly for 4 weeks and then 1 pill monthly.  Directions are also on the bottle.  Prescription has been sent to the [facility] on [address] voiced understanding of plan. Sander Nephew RN  January 17, 2010 11:03 AM  Initial call taken by: Sander Nephew RN,  January 17, 2010 11:03 AM  Follow-up for Phone Call        thanks Follow-up by: Alphia Moh MD,  January 17, 2010 11:23 AM

## 2010-10-29 NOTE — Miscellaneous (Signed)
Summary: [employer] Attorney @ [employer] Attorney @ Law   Imported By: Bonner Puna 10/01/2009 14:41:41  _____________________________________________________________________  External Attachment:    Type:   Image     Comment:   External Document

## 2010-10-29 NOTE — Progress Notes (Signed)
Summary: med refill/gp  Phone Note Refill Request Message from:  Fax from Pharmacy on July 08, 2010 11:10 AM  Refills Requested: Medication #1:  ZOCOR 40 MG  TABS Take 1 tab by mouth at bedtime   Last Refilled: 04/10/2010 Last appt. 9/28; next appt. 10/26.   Method Requested: Electronic Initial call taken by: Morrison Old RN,  July 08, 2010 11:10 AM  Follow-up for Phone Call        Rx faxed to pharmacy Follow-up by: Alphia Moh MD,  July 09, 2010 9:54 AM    Prescriptions: ZOCOR 40 MG  TABS (SIMVASTATIN) Take 1 tab by mouth at bedtime  #30 x prn   Entered and Authorized by:   Alphia Moh MD   Signed by:   Alphia Moh MD on 07/09/2010   Method used:   Electronically to        [id number] (retail)       [address] [place], [place]  [address]       Ph: [phone]       Fax: [phone]   RxIDIO:[id number]

## 2010-10-29 NOTE — Procedures (Signed)
Summary: wch/ gd      Allergies Added:   Allergies (verified): 1)  ! Codeine 2)  ! * Davocet 3)  ! Hydrocodone  PPM Specifications Following MD:  Virl Axe, MD     PPM Vendor:  [employer]     PPM Model Number:  [id number]     PPM Serial Number:  [id number] PPM DOI:  11/23/2009     PPM Implanting MD:  Virl Axe, MD  Lead 1    Location: [facility]     DOI: 11/23/2009     Model #: [id number]     Serial #: [id number]     Status: active Lead 2    Location: RV     DOI: 11/23/2009     Model #: [id number]     Serial #: [id number]     Status: active  Magnet Response Rate:  BOL 100 ERI 85  Indications:  Loletha Grayer   PPM Follow Up Remote Check?  No Battery Voltage:  3.20 V     Battery Est. Longevity:  11.5     Pacer Dependent:  Yes       PPM Device Measurements Atrium  Amplitude: 2.5 mV, Impedance: 410 ohms, Threshold: 0.75 V at 0.4 msec Right Ventricle  Amplitude: 12 mV, Impedance: 460 ohms, Threshold: 0.5 V at 0.4 msec  Episodes MS Episodes:  0     Percent Mode Switch:  0     Coumadin:  No Atrial Pacing:  46%     Ventricular Pacing:  71%  Parameters Mode:  DDD     Lower Rate Limit:  60     Upper Rate Limit:  120 Paced AV Delay:  200     Sensed AV Delay:  200 Next Cardiology Appt Due:  03/12/2010 Tech Comments:  No parameter changes.  Rate response blunted but adequate for the patients level of activity.  Steri strips removed, no redness or edema.  Unable to update medications in the system today: d/c lisinopril, Add Amlodipine 10mg  once daily , Isosorbide 30mg  once daily, Carvedilol 6.25mg  two times a day .  Activity restrictions reviewed with the patient.  She understands and is iin agreement.  ROV 3 months with Dr. Caryl Comes. Alma Friendly, LPN  March 16, [id number] AM

## 2010-10-29 NOTE — Assessment & Plan Note (Signed)
Summary: EST-ROUTINE CHECKUP/CH   Vital Signs:  Patient Profile:   60 Years Old Female Height:     62 inches (157.48 cm) Weight:      165.2 pounds (75.09 kg) BMI:     30.32 Temp:     98.5 degrees F (36.94 degrees C) oral Pulse rate:   62 / minute BP sitting:   112 / 66  (left arm)  Pt. in pain?   no  Vitals Entered By: Morrison Old RN (August 07, 2008 3:42 PM)              Is Patient Diabetic? Yes Did you bring your meter with you today? No Nutritional Status BMI of > 30 = obese  Have you ever been in a relationship where you felt threatened, hurt or afraid?Unable to ask: someonew/pt   Does patient need assistance? Functional Status Self care Ambulation Normal     Chief Complaint:  ED F/U.  History of Present Illness: Mrs Galat is a 60 yo woman with PMH as outlined in chart.  She is here today for a routine visit.  However, she was evaluated in [facility] ED and diagnosed with left ureterolithiasis and hydronephrosis.  She was treated with percocet, promethazine and cipro.  She has follow up appointment with urology on 12/3.  She is doing better, though continues to have some left abdominal pain with nausea.  Her left flank pain has completely resolved.  She currently denies any dysuria, hematuria, or fevers.  Of note, she was given a container for urine collection, and does not recall having passed the stone.     Prior Medications Reviewed Using: Medication Bottles  Updated Prior Medication List: LANTUS  SOLN (INSULIN GLARGINE SOLN) 20 units at bedtime ZOCOR 40 MG  TABS (SIMVASTATIN) Take 1 tab by mouth at bedtime IMDUR 30 MG TB24 (ISOSORBIDE MONONITRATE) Take 1 tablet by mouth once a day ASPIRIN 81 MG CHEW (ASPIRIN) once daily FUROSEMIDE 40 MG TABS (FUROSEMIDE) Take 1 tablet by mouth once a day LISINOPRIL 40 MG  TABS (LISINOPRIL) Take 1 tablet by mouth once a day CARVEDILOL 12.5 MG  TABS (CARVEDILOL) Take 1 tablet by mouth two times a day NOVOLIN R 100 UNIT/ML SOLN  (INSULIN REGULAR HUMAN) three times a day NEURONTIN 300 MG CAPS (GABAPENTIN) Take 1 tablet by mouth three times a day LORATADINE 10 MG  TABS (LORATADINE) Take 1 tablet by mouth once a day IBUPROFEN 400 MG TABS (IBUPROFEN) Take 1 tablet by mouth every 6 hours LOMOTIL 2.5-0.025 MG/5ML LIQD (DIPHENOXYLATE-ATROPINE) Take 10 mL by mouth four times a day for diarrhea AMLODIPINE BESYLATE 10 MG  TABS (AMLODIPINE BESYLATE) Take 1 tablet by mouth once a day CLONIDINE HCL 0.1 MG  TABS (CLONIDINE HCL) Take 1 tablet by mouth two times a day TRAVATAN 0.004 %  SOLN (TRAVOPROST) place 1 drop in each eye at bedtime PERCOCET 5-325 MG TABS (OXYCODONE-ACETAMINOPHEN) take 1-2  tablet every 6 hours as needed for pain PROMETHAZINE HCL 25 MG TABS (PROMETHAZINE HCL) take 1 tablet every 6 hours as needed for nausea  Current Allergies: ! CODEINE ! * DAVOCET ! 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 Daylene Katayama    History of alcohol abuse-remote    Onychomycosis-Dr. Amalia Hailey podiatry    Seasonal allergies    Peripheral vascular disease-s/p left femor to below knee pop bypass 03    Total abdominal hysterectomy-s/p & BSO-Dr. Delsa Sale    Cardiac arrest-06/02/2004    Coronary artery disease-EF 55% cath 05/2004; mild obstructive    Carpal tunnel syndrome    Memory loss-MMSE 23/30 07/17/2006    Chronic diarrhea    Risk Factors:  Tobacco use:  current    Year started:  25 years ago    Cigarettes:  Yes -- 1/2 pack(s) per day Alcohol use:  yes    Type:  BEER Exercise:  no Seatbelt use:  100 %  Mammogram History:    Date of Last  Mammogram:  01/05/2008   Review of Systems      See HPI   Physical Exam  General:     alert, well-developed, and well-nourished.   Head:     normocephalic and atraumatic.   Eyes:     anicteric Neck:     supple, no JVD, and no carotid bruits.   Lungs:     normal respiratory effort, no accessory muscle use, normal breath sounds, no crackles, and no wheezes.   Heart:     normal rate, regular rhythm, no murmur, no gallop, no rub, and no JVD.   Abdomen:     soft, normal bowel sounds, no distention, no hepatomegaly, and no splenomegaly.    Mild tenderness to palpation in left upper and lower quadrants, mainly in midsection. Extremities:     no edema Neurologic:     alert & oriented X3, cranial nerves II-XII intact, strength normal in all extremities, and gait normal.   Psych:     Oriented X3, memory intact for recent and remote, normally interactive, good eye contact, not anxious appearing, and not depressed appearing.      Impression & Recommendations:  Problem # 1:  NEPHROLITHIASIS (ICD-592.0) Unclear if pt has passed the stone, especially since it was 7x9mm.  She has clinically improved, but givne the persistent left sided abdominal pain and nausea, will check some basic labs to assess if stone may still be present (hematuria).  If so, there is concern for complicated pyelo (especially since her urine was dirty and treated with cipro, but no cx done), therefore, will also check CBC.  If they are normal, will wait until urology appointment 12/3.  If there are signs of infection, then will definitely re-scan. Orders: T-Urinalysis [id number]) T-Culture, Urine [id number]) T-CBC w/Diff [id number])   Problem # 2:  PULMONARY NODULE, LEFT LOWER LOBE (ICD-518.89) Incidental finding on abdominal CT.  Nodule is only 2.22mm in left base.  Since pt is a smoker, will need repeat imaging in 12 months.    Problem # 3:  RENAL INSUFFICIENCY (ICD-588.9) Pt noted to have a slightly  elevated Cr during ED visit.  She was hydrated and felt to be 2/2 dehydration.  Will repeat BMET today to assess for normalization. Orders: T-Basic Metabolic Panel ([id number])   Problem # 4:  HYPERTENSION (ICD-401.9) BP very well controlled now that she is on all her meds.  Will check BMET since she is on lasix and ACEI in light of recent renal insufficiency 2/2 dehydration.  If Cr not normalized, will need to hold lasix, and if increased  from ED visit will also hold ACEI.   Her updated medication list for this problem includes:    Furosemide 40 Mg Tabs (Furosemide) .Marland Kitchen... Take 1 tablet by mouth once a day    Lisinopril 40 Mg Tabs (Lisinopril) .Marland Kitchen... Take 1 tablet by mouth once a day    Carvedilol 12.5 Mg Tabs (Carvedilol) .Marland Kitchen... Take 1 tablet by mouth two times a day    Amlodipine Besylate 10 Mg Tabs (Amlodipine besylate) .Marland Kitchen... Take 1 tablet by mouth once a day    Clonidine Hcl 0.1 Mg Tabs (Clonidine hcl) .Marland Kitchen... Take 1 tablet by mouth two times a day  Orders: T-Basic Metabolic Panel ([id number])  BP today: 112/66 Prior BP: 186/89 (04/25/2008)  Labs Reviewed: Creat: 1.43 (04/11/2008) Chol: 205 (11/23/2007)   HDL: 57 (11/23/2007)   LDL: 93 (11/23/2007)   TG: 273 (11/23/2007)   Problem # 5:  HYPERLIPIDEMIA (ICD-272.4) Statin adjusted during last visit since LDL had risen from previous.  Will check lipids today to assess response to increase in statin.  LFTs done 10/24 during ED visit were normal.  Her updated medication list for this problem includes:    Zocor 40 Mg Tabs (Simvastatin) .Marland Kitchen... Take 1 tab by mouth at bedtime  Orders: T-Lipid Profile [id number])  Labs Reviewed: Chol: 205 (11/23/2007)   HDL: 57 (11/23/2007)   LDL: 93 (11/23/2007)   TG: 273 (11/23/2007) SGOT: 16 (04/11/2008)   SGPT: 10 (04/11/2008)   Complete Medication List: 1)  Lantus Soln (Insulin glargine soln) .... 20 units at bedtime 2)  Zocor 40 Mg Tabs (Simvastatin) .... Take 1 tab by mouth at bedtime 3)   Imdur 30 Mg Tb24 (Isosorbide mononitrate) .... Take 1 tablet by mouth once a day 4)  Aspirin 81 Mg Chew (Aspirin) .... Once daily 5)  Furosemide 40 Mg Tabs (Furosemide) .... Take 1 tablet by mouth once a day 6)  Lisinopril 40 Mg Tabs (Lisinopril) .... Take 1 tablet by mouth once a day 7)  Carvedilol 12.5 Mg Tabs (Carvedilol) .... Take 1 tablet by mouth two times a day 8)  Novolin R 100 Unit/ml Soln (Insulin regular human) .... Three times a day 9)  Neurontin 300 Mg Caps (Gabapentin) .... Take 1 tablet by mouth three times a day 10)  Loratadine 10 Mg Tabs (Loratadine) .... Take 1 tablet by mouth once a day 11)  Ibuprofen 400 Mg Tabs (Ibuprofen) .... Take 1 tablet by mouth every 6 hours 12)  Lomotil 2.5-0.025 Mg/105ml Liqd (Diphenoxylate-atropine) .... Take 10 ml by mouth four times a day for diarrhea 13)  Amlodipine Besylate 10 Mg Tabs (Amlodipine besylate) .... Take 1 tablet by mouth once a day 14)  Clonidine Hcl 0.1 Mg Tabs (Clonidine hcl) .... Take 1 tablet by mouth two times a day 15)  Travatan 0.004 % Soln (Travoprost) .... Place 1 drop in each eye at bedtime 16)  Percocet 5-325 Mg Tabs (Oxycodone-acetaminophen) .... Take 1-2  tablet every 6 hours as needed for pain 17)  Promethazine Hcl 25 Mg Tabs (Promethazine hcl) .... Take 1 tablet every 6 hours as needed for nausea  Other Orders: T- Capillary Blood Glucose [id number]) T-Hgb A1C (in-house) [id number])   Patient Instructions: 1)  Please schedule a follow-up appointment in 1 month, after 08/31/08. 2)  Will check labs today and depending on results, will consider repeating CT scan. 3)  Continue all your current medications. 4)  If you have any worsening of your symptoms, or any new symptoms, make sure to call clinic. 5)  Follow up with Urologist on August 31, 2008.   Prescriptions: PROMETHAZINE HCL 25 MG TABS (PROMETHAZINE HCL) take 1 tablet every 6 hours as needed for nausea  #12 x 0   Entered and Authorized by:   Alphia Moh MD    Signed by:   Alphia Moh MD on 08/07/2008   Method used:   Print then Give to Patient   RxID:   [id number] PERCOCET 5-325 MG TABS (OXYCODONE-ACETAMINOPHEN) take 1-2  tablet every 6 hours as needed for pain  #20 x 0   Entered and Authorized by:   Alphia Moh MD   Signed by:   Alphia Moh MD on 08/07/2008   Method used:   Print then Give to Patient   [id number]  ]

## 2010-10-29 NOTE — Assessment & Plan Note (Signed)
Summary: per Dr. Ronny Flurry [facility]fu [mkj]   Vital Signs:  Patient profile:   60 year old female Height:      62 inches (157.48 cm) Weight:      164.0 pounds (74.55 kg) BMI:     30.10 Temp:     98.3 degrees F (36.83 degrees C) oral Pulse rate:   58 / minute BP sitting:   157 / 71  (right arm)  Vitals Entered By: Hilda Blades Ditzler RN (November 12, 2009 3:15 PM) Is Patient Diabetic? Yes Did you bring your meter with you today? Yes Pain Assessment Patient in pain? no      Nutritional Status BMI of 25 - 29 = overweight Nutritional Status Detail appetite good CBG Result 68  Have you ever been in a relationship where you felt threatened, hurt or afraid?denies   Does patient need assistance? Functional Status Self care Ambulation Impaired:Risk for fall Comments Uses a cane - sister with pt. HFU - discuss dosage Lisinopril and refills n/v supp - used last one about 2 weeks ago.   Primary Care Provider:  Alphia Moh MD   History of Present Illness: [name] is a 60 yo woman with [place] as outlined in chart. Patient was admitted for dizziness with syncope, which is likely due to diabetic neuropathy. During the hospitalization the 2D-ECHO and carotid dopper are unremarkable and her dizzines has been better and will bedischarged. Today the patient presents to [facility] for hospital F/U, issues today are:   1) suppositories refill needed (not on med list, and patient does not know med name)   2) requests lisinopril dosage needed as she was not clear on correct dosage on D/C  3) CBG today is 68, SSI patient take 10-15 units per dose. adjustment needed to SSI. Patient was on avandia in the past, never tried metformin/glipizide.Marland Kitchen   also takes lantus 5 units.   4) Patient c/o of 2 episodes of syncope since d/c home. Patient states that she is confused post "syncope" for 5-6.   She denies any CP,SOB,cough,AP,N,V,diarrhea or any other complaints.   Depression History:      The patient denies a  depressed mood most of the day and a diminished interest in her usual daily activities.  The patient denies significant weight loss, significant weight gain, insomnia, hypersomnia, psychomotor agitation, psychomotor retardation, fatigue (loss of energy), feelings of worthlessness (guilt), impaired concentration (indecisiveness), and recurrent thoughts of death or suicide.         Preventive Screening-Counseling & Management  Alcohol-Tobacco     Alcohol type: BEER     Smoking Status: current     Smoking Cessation Counseling: yes     Packs/Day: 1 cig per day.     Year Started: 25 years ago  Caffeine-Diet-Exercise     Does Patient Exercise: no  Current Medications (verified): 1)  Lantus  Soln (Insulin Glargine Soln) .... 5 Units Sq At Bedtime 2)  Zocor 40 Mg  Tabs (Simvastatin) .... Take 1 Tab By Mouth At Bedtime 3)  Aspirin 81 Mg Chew (Aspirin) .... Once Daily 4)  Lisinopril 40 Mg  Tabs (Lisinopril) .... Take 1 Tablet By Mouth Once A Day 5)  Novolin R 100 Unit/ml Soln (Insulin Regular Human) .... Three Times A Day Per Sliding Scale 6)  Neurontin 300 Mg Caps (Gabapentin) .... Take 1 Tablet By Mouth Three Times A Day 7)  Loratadine 10 Mg  Tabs (Loratadine) .... Take 1 Tablet By Mouth Once A Day 8)  Lomotil 2.5-0.025 Mg/40ml  Liqd (Diphenoxylate-Atropine) .... Take 10 Ml By Mouth Four Times A Day For Diarrhea 9)  Clonidine Hcl 0.1 Mg  Tabs (Clonidine Hcl) .... Take 1 Tablet By Mouth Two Times A Day 10)  Travatan 0.004 %  Soln (Travoprost) .... Place 1 Drop in Each Eye At Bedtime 11)  Truetrack Test  Strp (Glucose Blood) .... To Test Blood Sugar Twice Daily  Allergies (verified): 1)  ! Codeine 2)  ! * Davocet 3)  ! Hydrocodone  Review of Systems       Patient is otherwise doing well, and denies any other complaints.    Physical Exam  General:  alert, well-developed, and cooperative to examination.    Head:  normocephalic and atraumatic.    Neck:  supple, full ROM, no thyromegaly, no  JVD, and no carotid bruits.    Lungs:  normal respiratory effort, no accessory muscle use, normal breath sounds, no crackles, and no wheezes.  Heart:  normal rate, regular rhythm, no murmur, no gallop, and no rub.    Abdomen:  soft, non-tender, normal bowel sounds, no distention, no guarding, no rebound tenderness, no hepatomegaly, and no splenomegaly.    Msk:  no joint swelling, no joint warmth, and no redness over joints.    Pulses:  2+ DP/PT pulses bilaterally  Extremities:  No cyanosis, clubbing, edema  Neurologic:  alert & oriented X3, cranial nerves II-XII intact, strength normal in all extremities, sensation intact to light touch, and gait normal.       Impression & Recommendations:  Problem # 1:  SYNCOPE (ICD-780.2) -Echo + carotid dopplers were WNL while in the hospital. -patient has had 2 further episodes of syncope since d/c. -this may be 2/2 to hypoglycemia as the patient has been recieving large doses of insulin in her SSI.  Problem # 2:  IDDM (ICD-250.01) Patient takes lantus 5 U at bedtime, and 10-15 units of SSI!!! this has been causing her to have hypoglycemia as the patient has been recieving large doses of insulin in her SSI, she is giving her self this because she said she was never given clear instructions on what SSI to use. I could not read her meter today, and it was non functional.. I advised the patient to use Sensitive SSI which  o units for CBG 70-120 1 unit for CBG  121-151 2 units for CBG 151-200 3 units for CBG 201-250 5 units for CBG 251-300 7 units for CBG 301-350 9 units for CBG 350-400 10 units if CBG >400 and to recheck in 2-3 hours..  Will have the patient f/u in 1 month to check her meter for any lows and to check if she is compliant with the new SSI instructions given to her.  Her updated medication list for this problem includes:    Lantus Soln (Insulin glargine soln) .Marland KitchenMarland KitchenMarland KitchenMarland Kitchen 5 units sq at bedtime    Aspirin 81 Mg Chew (Aspirin) ..... Once  daily    Lisinopril 40 Mg Tabs (Lisinopril) .Marland Kitchen... Take 1 tablet by mouth once a day    Novolin R 100 Unit/ml Soln (Insulin regular human) .Marland Kitchen... Three times a day per sliding scale  Orders: T-Comprehensive Metabolic Panel ([id number])  Problem # 3:  CHRONIC KIDNEY DISEASE STAGE III (MODERATE) (ICD-585.3) baseline Cr 1.5, no active issues, will check BMET for progress today.  Orders: T-Comprehensive Metabolic Panel ([id number])  Problem # 4:  NEPHROLITHIASIS (ICD-592.0) she will go to dr. Angelica Ran in april for f/u, no active issue present now.   Problem #  5:  HYPERTENSION (ICD-401.9) Patient has been taking 1/2 of her lisinopril dose per last d/c will increase to full dose of 40mg  as BP was high today,  and will check TSH as her BP has wide pulse pressure without presence of COPD. on next visit if remain elevated we can add further agent for better control of BP  Her updated medication list for this problem includes:    Lisinopril 40 Mg Tabs (Lisinopril) .Marland Kitchen... Take 1 tablet by mouth once a day    Clonidine Hcl 0.1 Mg Tabs (Clonidine hcl) .Marland Kitchen... Take 1 tablet by mouth two times a day  Orders: T-TSH [id number]) T-T4, Free [id number])  Problem # 6:  Preventive Health Care (ICD-V70.0) I did not address this during this visit, will need to be addressed on next f/u in 1 month.   Complete Medication List: 1)  Lantus Soln (Insulin glargine soln) .... 5 units sq at bedtime 2)  Zocor 40 Mg Tabs (Simvastatin) .... Take 1 tab by mouth at bedtime 3)  Aspirin 81 Mg Chew (Aspirin) .... Once daily 4)  Lisinopril 40 Mg Tabs (Lisinopril) .... Take 1 tablet by mouth once a day 5)  Novolin R 100 Unit/ml Soln (Insulin regular human) .... Three times a day per sliding scale 6)  Neurontin 300 Mg Caps (Gabapentin) .... Take 1 tablet by mouth three times a day 7)  Loratadine 10 Mg Tabs (Loratadine) .... Take 1 tablet by mouth once a day 8)  Lomotil 2.5-0.025 Mg/75ml Liqd (Diphenoxylate-atropine) ....  Take 10 ml by mouth four times a day for diarrhea 9)  Clonidine Hcl 0.1 Mg Tabs (Clonidine hcl) .... Take 1 tablet by mouth two times a day 10)  Travatan 0.004 % Soln (Travoprost) .... Place 1 drop in each eye at bedtime 11)  Truetrack Test Strp (Glucose blood) .... To test blood sugar twice daily  Other Orders: T-Lipid Profile [id number]) Capillary Blood Glucose/CBG [id number])  Patient Instructions: 1)  Please schedule a follow-up appointment in 1 month. Process Orders Check Orders Results:     [facility]: ABN not required for this insurance Tests Sent for requisitioning (November 12, 2009 4:51 PM):     11/12/2009: [facility] -- T-Comprehensive Metabolic Panel [id number] (signed)     11/12/2009: [facility] -- T-Lipid Profile [phone] (signed)     11/12/2009: [facility] -- T-TSH [phone] (signed)     11/12/2009: [facility] -- T-T4, [place] [[id number]] (signed)    Process Orders Check Orders Results:     [facility]: ABN not required for this insurance Tests Sent for requisitioning (November 12, 2009 4:51 PM):     11/12/2009: [facility] -- T-Comprehensive Metabolic Panel [id number] (signed)     11/12/2009: [facility] -- T-Lipid Profile [phone] (signed)     11/12/2009: [facility] -- T-TSH [phone] (signed)     11/12/2009: [facility] -- [place], [place] [[id number]] (signed)     Prevention & Chronic Care Immunizations   Influenza vaccine: Fluvax Non-MCR  (08/06/2009)   Influenza vaccine deferral: Refused  (07/09/2009)    Tetanus booster: 08/06/2009: Td   Td booster deferral: Refused  (07/09/2009)    Pneumococcal vaccine: Not documented  Colorectal Screening   Hemoccult: Not documented    Colonoscopy: Not documented  Other Screening   Pap smear: Not documented   Pap smear  action/deferral: PAP smear not indicated at this time  (11/26/2006)    Mammogram: ASSESSMENT: Negative - BI-RADS 1^MM DIGITAL SCREENING  (  01/23/2009)   Mammogram action/deferral: mammogram ordered  (11/26/2006)   Mammogram due: 12/2008   Smoking status: current  (11/12/2009)   Smoking cessation counseling: yes  (11/12/2009)  Diabetes Mellitus   HgbA1C: 6.1  (10/29/2009)   HgbA1C action/deferral: Ordered  (10/29/2009)    Eye exam: No diabetic retinopathy.   Glaucoma:  well controlled Cataracts, subcapsular     (07/10/2009)   Eye exam due: 09/2009    Foot exam: Not documented   High risk foot: Not documented   Foot care education: Not documented    Urine microalbumin/creatinine ratio: 633.9  (04/11/2008)  Lipids   Total Cholesterol: 151  (07/09/2009)   Lipid panel action/deferral: Lipid Panel ordered   LDL: 63  (07/09/2009)   LDL Direct: Not documented   HDL: 67  (07/09/2009)   Triglycerides: 106  (07/09/2009)    SGOT (AST): 19  (07/09/2009)   BMP action: Ordered   SGPT (ALT): 13  (07/09/2009) CMP ordered    Alkaline phosphatase: 112  (07/09/2009)   Total bilirubin: 0.9  (07/09/2009)  Hypertension   Last Blood Pressure: 157 / 71  (11/12/2009)   Serum creatinine: 1.74  (08/06/2009)   BMP action: Ordered   Serum potassium 4.9  (08/06/2009) CMP ordered   Self-Management Support :   Personal Goals (by the next clinic visit) :     Personal A1C goal: 6  (06/22/2009)     Personal blood pressure goal: 130/80  (06/22/2009)     Personal LDL goal: 100  (06/22/2009)    Patient will work on the following items until the next clinic visit to reach self-care goals:     Medications and monitoring: take my medicines every day, check my blood sugar, check my blood pressure, bring all of my medications to every visit, examine my feet every day  (11/12/2009)     Eating: eat more vegetables, use fresh or frozen vegetables, eat foods that are low in salt, eat fruit for snacks and  desserts  (11/12/2009)     Activity: take a 30 minute walk every day  (11/12/2009)    Diabetes self-management support: Written self-care plan  (08/06/2009)    Hypertension self-management support: Written self-care plan  (08/06/2009)    Lipid self-management support: Written self-care plan  (08/06/2009)

## 2010-10-29 NOTE — Progress Notes (Signed)
Summary: refill/ hla  Phone Note Refill Request Message from:  Patient on March 11, 2010 3:55 PM  Refills Requested: Medication #1:  NOVOLIN R 100 UNIT/ML SOLN three times a day per sliding scale  Medication #2:  prodigy insulin syringes Initial call taken by: Freddy Finner RN,  March 11, 2010 3:55 PM  Follow-up for Phone Call        Rx faxed to pharmacy Follow-up by: Alphia Moh MD,  March 11, 2010 9:50 PM    New/Updated Medications: PRODIGY INSULIN SYRINGE 31G X 5/16" 0.3 ML MISC (INSULIN SYRINGE-NEEDLE U-100) use to inject insulin as directed Prescriptions: PRODIGY INSULIN SYRINGE 31G X 5/16" 0.3 ML MISC (INSULIN SYRINGE-NEEDLE U-100) use to inject insulin as directed  #100 x 6   Entered and Authorized by:   Alphia Moh MD   Signed by:   Alphia Moh MD on 03/11/2010   Method used:   Electronically to        [id number] (retail)       [address] [place], [place]  [address]       Ph: [phone]       Fax: [phone]   [id number] NOVOLIN R 100 UNIT/ML SOLN (INSULIN REGULAR HUMAN) three times a day per sliding scale  #1 vial x 6   Entered and Authorized by:   Alphia Moh MD   Signed by:   Alphia Moh MD on 03/11/2010   Method used:   Electronically to        [id number] (retail)       [address] [place], [place]  [address]       Ph: [phone]       Fax: [phone]   [id number]

## 2010-10-29 NOTE — Cardiovascular Report (Signed)
Summary: Office Visit   Office Visit   Imported By: Sallee Provencal 03/18/2010 15:25:44  _____________________________________________________________________  External Attachment:    Type:   Image     Comment:   External Document

## 2010-10-29 NOTE — Assessment & Plan Note (Signed)
Summary: orthostatics   Vital Signs:  Patient profile:   60 year old female Pulse (ortho):   78 / minute BP standing:   140 / 60 Comments Taken to Room 3730 via w/c with belongings at 1:05PM.   [name] Blades Ditzler RN   Serial Vital Signs/Assessments:  Time      Position  BP       Pulse  Resp  Temp     By 12:30 PM  Lying RA  140/60   78                    Mamie Hague NT II 12:30 PM  Sitting   140/60   78                    Mamie Hague NT II 12:30 PM  Standing  140/60   78                    Mamie Hague NT II   Allergies: 1)  ! Codeine 2)  ! * Davocet 3)  ! Hydrocodone   Complete Medication List: 1)  Lantus Soln (Insulin glargine soln) .Marland Kitchen.. 10 units sq at bedtime 2)  Zocor 40 Mg Tabs (Simvastatin) .... Take 1 tab by mouth at bedtime 3)  Aspirin 81 Mg Chew (Aspirin) .... Once daily 4)  Novolin R 100 Unit/ml Soln (Insulin regular human) .... Three times a day per sliding scale 5)  Neurontin 300 Mg Caps (Gabapentin) .... Take 1 tablet by mouth three times a day 6)  Loratadine 10 Mg Tabs (Loratadine) .... Take 1 tablet by mouth once a day 7)  Lomotil 2.5-0.025 Mg/5ml Liqd (Diphenoxylate-atropine) .... Take 10 ml by mouth four times a day for diarrhea 8)  Clonidine Hcl 0.1 Mg Tabs (Clonidine hcl) .... Take 1 tablet by mouth two times a day 9)  Travatan 0.004 % Soln (Travoprost) .... Place 1 drop in each eye at bedtime 10)  Truetrack Test Strp (Glucose blood) .... To test blood sugar twice daily 11)  Phenadoz 12.5 Mg Supp (Promethazine hcl) .... Insert 1 per rectum every 8 hours as needed for nausea and vomiting 12)  Coreg 6.25 Mg Tabs (Carvedilol) .... Take 1 tablet by mouth two times a day 13)  Imdur 30 Mg Xr24h-tab (Isosorbide mononitrate) .... Take 1 tablet by mouth once a day 14)  Norvasc 10 Mg Tabs (Amlodipine besylate) .... Take 1 tablet by mouth once a day 15)  Tums 500 Mg Chew (Calcium carbonate antacid) .... Take 1 tablet by mouth three times a day with meals 16)  Ergocalciferol  50000 Unit Caps (Ergocalciferol) .... Take 1 tablet by mouth weekly for 4 weeks, then monthly

## 2010-10-29 NOTE — Cardiovascular Report (Signed)
Summary: Office Visit   Office Visit   Imported By: Sallee Provencal 12/07/2009 12:24:23  _____________________________________________________________________  External Attachment:    Type:   Image     Comment:   External Document

## 2010-10-29 NOTE — Progress Notes (Signed)
Summary: Refill/gh  Phone Note Refill Request Message from:  Fax from Pharmacy on August 05, 2010 10:46 AM  Refills Requested: Medication #1:  Vitamin D 50,000 units Cap # 6 1 PO monthly   Last Refilled: 03/12/2010  Method Requested: Electronic Initial call taken by: Sander Nephew RN,  August 05, 2010 10:53 AM  Follow-up for Phone Call        Need to re-evaluate continued need. Will have to check Vit D level. Need to assess secondary hyperparathyroidism (Ca, PO4, and iPTH).  Follow-up by: Alphia Moh MD,  August 07, 2010 2:58 PM

## 2010-10-29 NOTE — Progress Notes (Signed)
Summary: Results  Phone Note Outgoing Call   Call placed by: Sander Nephew RN,  May 15, 2010 3:49 PM Call placed to: Patient Summary of Call: Call to pt unable to reach.  Pt was to be informed that her Kidney Function Test are back to baseline since stopping her Lisinopril.Sander Nephew RN  May 15, 2010 3:50 PM  Initial call taken by: Sander Nephew RN,  May 15, 2010 3:51 PM  Follow-up for Phone Call        RTC to pt given message that her Kidney functions are better and back to baseline since she is off the Lisinopril. Sander Nephew RN  May 16, 2010 8:55 AM

## 2010-10-29 NOTE — Progress Notes (Signed)
Summary: refill/ hla  Phone Note Refill Request Message from:  Patient on November 13, 2009 9:01 AM  Refills Requested: Medication #1:  phenadoz 1 supp per rectum every 8 hrs prn nausea and vomit pt was prescribed these by dr Patrice Paradise, they are on her discont med list, pt's caregiver states she needs them from time to time for N&V  Initial call taken by: Freddy Finner RN,  November 13, 2009 9:04 AM  Follow-up for Phone Call        done Follow-up by: Alphia Moh MD,  November 13, 2009 2:38 PM    New/Updated Medications: PHENADOZ 12.5 MG SUPP (PROMETHAZINE HCL) insert 1 per rectum every 8 hours as needed for nausea and vomiting Prescriptions: PHENADOZ 12.5 MG SUPP (PROMETHAZINE HCL) insert 1 per rectum every 8 hours as needed for nausea and vomiting  #10 x 0   Entered and Authorized by:   Alphia Moh MD   Signed by:   Alphia Moh MD on 11/13/2009   Method used:   Electronically to        [id number] (retail)       [address] [place], [place]  [address]       Ph: [phone]       Fax: [phone]   RxID:   [id number]

## 2010-10-29 NOTE — Progress Notes (Signed)
Summary: Medication making pt sick  Phone Note Call from Patient   Caller: Patient Complaint: Chest Pain Summary of Call: Call from pt's friend says that her B/P medication makes her sick and makes her throw up.   Is on 40mg  of Lisinopril.  Pt has been off the medication x 2 days and has experienced no nausea vomiting or  passing out.  Pt given an appointment for this afternoon. Initial call taken by: Sander Nephew RN,  October 29, 2009 1:56 PM  Follow-up for Phone Call        Agree with need for evaluation.  It looks like she has been on the lisinopril for > 1 year w/o reported problems.  Nausea and vomiting is a strange idiosyncratic reaction for lisinopril and makes me suspicious it may have been related to something else such as a gastroenteritis.  ACEI is important in [name] [name] so we need to be sure if we plan on listing this as an adverse reaction. Follow-up by: Oval Linsey MD,  October 29, 2009 2:15 PM

## 2010-10-29 NOTE — Consult Note (Signed)
[facility]: [facility]   Imported By: Lacy Duverney 07/31/2010 16:31:34  _____________________________________________________________________  External Attachment:    Type:   Image     Comment:   External Document
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
## 2010-10-29 NOTE — Miscellaneous (Signed)
Summary: Device preload  Clinical Lists Changes  Observations: Added new observation of MAGNET RTE: BOL 100 ERI 85 (11/26/2009 13:31) Added new observation of PPMLEADSTAT2: active (11/26/2009 13:31) Added new observation of PPMLEADSER2: [id number] (11/26/2009 13:31) Added new observation of PPMLEADMOD2: 2088TC (11/26/2009 13:31) Added new observation of PPMLEADLOC2: RV (11/26/2009 13:31) Added new observation of PPMLEADSTAT1: active (11/26/2009 13:31) Added new observation of PPMLEADSER1: [id number] (11/26/2009 13:31) Added new observation of PPMLEADMOD1: 2088TC (11/26/2009 13:31) Added new observation of PPMLEADLOC1: RA (11/26/2009 13:31) Added new observation of PPMLEADDOI2: 11/23/2009 (11/26/2009 13:31) Added new observation of PPMLEADDOI1: 11/23/2009 (11/26/2009 13:31) Added new observation of PPM IMP MD: Virl Axe, MD (11/26/2009 13:31) Added new observation of PPM DOI: 11/23/2009 (11/26/2009 13:31) Added new observation of PPM SERL#: [id number]  (11/26/2009 13:31) Added new observation of PPM MODL#: [id number]  (11/26/2009 13:31) Added new observation of PACEMAKERMFG: [employer] Jude  (11/26/2009 13:31) Added new observation of PACEMAKER MD: Virl Axe, MD  (11/26/2009 13:31)      PPM Specifications Following MD:  Virl Axe, MD     PPM Vendor:  [employer]     PPM Model Number:  [id number]     PPM Serial Number:  [id number] PPM DOI:  11/23/2009     PPM Implanting MD:  Virl Axe, MD  Lead 1    Location: RA     DOI: 11/23/2009     Model #: [id number]     Serial #: [id number]     Status: active Lead 2    Location: RV     DOI: 11/23/2009     Model #: [id number]     Serial #: [id number]     Status: active  Magnet Response Rate:  BOL 100 ERI 85

## 2010-10-29 NOTE — Progress Notes (Signed)
Summary: prescription pad   Phone Note Call from Patient Call back at Home Phone [phone]   Reason for Call: Talk to Nurse Summary of Call: has a prescription pad with something on it that says 1 week, can not read it Initial call taken by: Darnell Level,  November 28, 2009 10:44 AM  Follow-up for Phone Call        Per Dr. Olin Pia hospital note she needs a repeat BMET in 1 week. Daughter aware and I scheduled her for 12/04/09.  Follow-up by: Barnett Abu, RN, BSN,  November 28, 2009 11:38 AM

## 2010-10-29 NOTE — Miscellaneous (Signed)
Summary: Hospital Discharge  [facility] Discharge  Date of admission: 10/29/2009  Date of discharge: 10/31/2009  Brief reason for admission/active problems:  Patient was admitted for dizziness with syncope, which is likely due to diabetic neuropathy. During the hospitalization the 2D-ECHO and carotid dopper are unremarkable and her dizzines has been better and will bedischarged. We has told the patient to take precautions for fall.   Followup needed: She will have an appointment with Dr. Vanessa Kick on 11/12/2009 at 2:00 PM. At that time to check her symptoms and BP and adjust her BP if needed because we only restart her lisinopril. Also check her CBG and DM control.  The medication and problem lists have been updated.  Please see the dictated discharge summary for details.     Updated Prior Medication List: LANTUS  SOLN (INSULIN GLARGINE SOLN) 20 units at bedtime ZOCOR 40 MG  TABS (SIMVASTATIN) Take 1 tab by mouth at bedtime ASPIRIN 81 MG CHEW (ASPIRIN) once daily LISINOPRIL 40 MG  TABS (LISINOPRIL) Take 1 tablet by mouth once a day NOVOLIN R 100 UNIT/ML SOLN (INSULIN REGULAR HUMAN) three times a day per sliding scale NEURONTIN 300 MG CAPS (GABAPENTIN) Take 1 tablet by mouth three times a day LORATADINE 10 MG  TABS (LORATADINE) Take 1 tablet by mouth once a day LOMOTIL 2.5-0.025 MG/5ML LIQD (DIPHENOXYLATE-ATROPINE) Take 10 mL by mouth four times a day for diarrhea CLONIDINE HCL 0.1 MG  TABS (CLONIDINE HCL) Take 1 tablet by mouth two times a day TRAVATAN 0.004 %  SOLN (TRAVOPROST) place 1 drop in each eye at bedtime TRUETRACK TEST  STRP (GLUCOSE BLOOD) to test blood sugar twice daily  Current Allergies: ! CODEINE ! * DAVOCET ! HYDROCODONE   Patient Instructions: 1)  You will have an appointment with Dr. Vanessa Kick on 11/12/2009 at 2:00 PM.  2)  Please bring your glucometer with you. 3)  Check your blood sugars regularly. If your readings are usually above : or below  70 you should contact our office.
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
## 2010-10-29 NOTE — Letter (Signed)
Summary: [facility] Duty  To Whom It May Concern:  Please excuse [name] [name] from her jury summons.  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  She was just discharged from the hospital 11/27/09 and is still not back to her baseline.  She may be able to serve in the near future, however, her summons for 12/11/09 seems to close for her to be able to attend.  Sincerely,

## 2010-10-29 NOTE — Procedures (Signed)
Summary: SOB (786.05)/MMR    Allergies: 1)  ! Codeine 2)  ! * Davocet 3)  ! Hydrocodone  PPM Specifications Following MD:  Virl Axe, MD     PPM Vendor:  [employer]     PPM Model Number:  [id number]     PPM Serial Number:  [id number] PPM DOI:  11/23/2009     PPM Implanting MD:  Virl Axe, MD  Lead 1    Location: [facility]     DOI: 11/23/2009     Model #: [id number]     Serial #: [id number]     Status: active Lead 2    Location: RV     DOI: 11/23/2009     Model #: [id number]     Serial #: [id number]     Status: active  Magnet Response Rate:  BOL 100 ERI 85    PPM Follow Up Remote Check?  No Battery Voltage:  3.20 V     Battery Est. Longevity:  12.2 YEARS     Pacer Dependent:  No       PPM Device Measurements Atrium  Amplitude: 2.2 mV, Impedance: 410 ohms, Threshold: 0.75 V at 0.5 msec Right Ventricle  Amplitude: 12 mV, Impedance: 560 ohms, Threshold: 0.375 V at 0.5 msec  Episodes MS Episodes:  0     Ventricular High Rate:  0     Atrial Pacing:  36%     Ventricular Pacing:  62%  Parameters Mode:  DDD     Lower Rate Limit:  60     Upper Rate Limit:  120 Paced AV Delay:  200     Sensed AV Delay:  200 Tech Comments:  Pt seen as add-on today because of SOB s/p PPM implant.  Normal device function.  Pt c/o SOB when getting up in the morning.  No pain with deep breathing, no SOB with exertion.  Leads stable on interrogation.  No changes made today.  D/W Dr Lovena Le, pt to follow up as scheduled.  Chanetta Marshall RN BSN  December 03, 2009 11:34 AM

## 2010-10-29 NOTE — Progress Notes (Signed)
Summary: phone/gg  Phone Note Call from Patient   Caller: Patient Summary of Call: Pt called with c/o n/vomiting, feet swelling, and fever x 3 days. Pt walked into clinic at 0925.   Will see today at 1000 Initial call taken by: Gevena Cotton RN,  Feb 18, 2010 9:29 AM

## 2010-10-29 NOTE — Progress Notes (Signed)
Summary: "passing out"/ hla  Phone Note Call from Patient   Caller: maxine Summary of Call: caregiver calls to state pt has had 2 recent episodes of body trembling then "passing out", caregiver is advised to call 911 asap seeing as pt is "passed out" now, she states that pt is breathing but not arousable. conversation is ended since caregiver is calling 911. this was at 1436. at 77 i called the pt's home and was told pt had not been taken by ems, they did not call ems, the caregiver states she helped pt lay down to rest, she is now told that something could seriously be wrong and she must call 911 now, she states she will bring her, i re-emphasized that she needs to be seen asap. she states she will. Initial call taken by: Freddy Finner RN,  November 21, 2009 3:55 PM  Follow-up for Phone Call        Agree with the above advice that Ms. Ivanov needs to be seen ASAP. Follow-up by: Oval Linsey MD,  November 21, 2009 4:00 PM
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
## 2010-10-31 NOTE — Progress Notes (Signed)
Summary: Refill/gh  Phone Note Refill Request Message from:  Fax from Pharmacy on September 25, 2010 11:54 AM  Refills Requested: Medication #1:  LOMOTIL 2.5-0.025 MG/5ML LIQD Take 10 mL by mouth four times a day for diarrhea   Last Refilled: 05/22/2010  Medication #2:  LANTUS  SOLN 10 units sq at bedtime last office visit was 07/24/2010. Last labs were 05/14/2010.   Method Requested: Electronic Initial call taken by: Sander Nephew RN,  September 25, 2010 11:55 AM    Prescriptions: LOMOTIL 2.5-0.025 MG/5ML LIQD (DIPHENOXYLATE-ATROPINE) Take 10 mL by mouth four times a day for diarrhea  #663ml x 0   Entered and Authorized by:   Burman Freestone MD   Signed by:   Burman Freestone MD on 09/25/2010   Method used:   Electronically to        [id number] (retail)       [place], [place]  [address]       Ph: [phone]       Fax: [phone]   [id number] LANTUS  SOLN (INSULIN GLARGINE SOLN) 10 units sq at bedtime  #1 vial x 3   Entered and Authorized by:   Burman Freestone MD   Signed by:   Burman Freestone MD on 09/25/2010   Method used:   Telephoned to ...       [facility] (retail)       [address] [place], [place]  [address]       Ph: [phone]       Fax: [phone]   RxID:   [id number]

## 2010-11-06 ENCOUNTER — Other Ambulatory Visit: Payer: Self-pay | Admitting: Internal Medicine

## 2010-11-06 ENCOUNTER — Encounter: Payer: Self-pay | Admitting: Internal Medicine

## 2010-11-06 ENCOUNTER — Ambulatory Visit (INDEPENDENT_AMBULATORY_CARE_PROVIDER_SITE_OTHER): Payer: Medicaid Other | Admitting: Internal Medicine

## 2010-11-06 DIAGNOSIS — E119 Type 2 diabetes mellitus without complications: Secondary | ICD-10-CM

## 2010-11-06 DIAGNOSIS — E109 Type 1 diabetes mellitus without complications: Secondary | ICD-10-CM

## 2010-11-06 DIAGNOSIS — E211 Secondary hyperparathyroidism, not elsewhere classified: Secondary | ICD-10-CM

## 2010-11-06 DIAGNOSIS — E785 Hyperlipidemia, unspecified: Secondary | ICD-10-CM

## 2010-11-06 DIAGNOSIS — N183 Chronic kidney disease, stage 3 unspecified: Secondary | ICD-10-CM

## 2010-11-06 DIAGNOSIS — I10 Essential (primary) hypertension: Secondary | ICD-10-CM

## 2010-11-06 LAB — PTH, INTACT AND CALCIUM: Calcium, Total (PTH): 9.3 mg/dL (ref 8.4–10.5)

## 2010-11-06 LAB — GLUCOSE, CAPILLARY: Glucose-Capillary: 186 mg/dL — ABNORMAL HIGH (ref 70–99)

## 2010-11-06 LAB — GLUCOSE, POCT (MANUAL RESULT ENTRY): POC Glucose: 186

## 2010-11-06 LAB — POCT GLYCOSYLATED HEMOGLOBIN (HGB A1C): Hemoglobin A1C: 6

## 2010-11-06 MED ORDER — LISINOPRIL 10 MG PO TABS: 10.0000 mg | ORAL_TABLET | Freq: Every day | ORAL | Status: DC

## 2010-11-06 NOTE — Assessment & Plan Note (Signed)
Check lipids and LFTs today Stable and at goal in past

## 2010-11-06 NOTE — Assessment & Plan Note (Signed)
Elevated.  Will start ACE-I today.  Check BMP today and repeat in 1 week given Stage III CKD.  Pt agreeable.

## 2010-11-06 NOTE — Patient Instructions (Signed)
Follow up in 3 months Will check labs today as discussed. Will start lisinopril as prescribed for blood pressure Will recheck labs in 1 week. If you have any problems before your next appointment, call clinic.

## 2010-11-06 NOTE — Assessment & Plan Note (Signed)
Pt currently using lantus 10u qhs and Novolin SSI qam only. A1c at goal, no change today. Continue to monitor CBgs, no hypoglycemia reported.

## 2010-11-06 NOTE — Assessment & Plan Note (Signed)
Checking RFP and iPTH today

## 2010-11-06 NOTE — Progress Notes (Signed)
  Subjective:    Patient ID: [name] [name], female    DOB: 1951-08-24, 60 y.o.   MRN: [id number]  HPI Here for routine visit.  Doing well without complaints.  Following with Dr. Vevelyn Francois for left diabetic foot ulcer.  Currently on Augmentin s/p graft.  She is using lantus and Novolin R (only in the morning, not tid).  She checks CBGs bid and denies any hypoglycemic episodes or symptoms of.     Review of Systems  Constitutional: Negative for fatigue.  Eyes: Negative for visual disturbance.  Respiratory: Negative for shortness of breath.   Cardiovascular: Negative for chest pain and palpitations.  Neurological: Negative for syncope.       Objective:   Physical Exam  Constitutional: She is oriented to person, place, and time. She appears well-developed and well-nourished. No distress.  HENT:  Head: Normocephalic and atraumatic.  Eyes: Conjunctivae and EOM are normal. Pupils are equal, round, and reactive to light. Right eye exhibits no discharge. Left eye exhibits no discharge. No scleral icterus.  Neck: Normal range of motion. No JVD present.  Cardiovascular: Normal rate and regular rhythm.  Exam reveals no gallop and no friction rub.   No murmur heard.      Split S2  Pulmonary/Chest: Effort normal and breath sounds normal. No respiratory distress. She has no wheezes. She has no rales.  Abdominal: Soft. Bowel sounds are normal. There is no tenderness.  Musculoskeletal:       Left foot bandaged.  Using boot.  Neurological: She is alert and oriented to person, place, and time.  Psychiatric: She has a normal mood and affect.          Assessment & Plan:

## 2010-11-07 LAB — LIPID PANEL
HDL: 60 mg/dL (ref >39)
LDL Cholesterol: 111 mg/dL — ABNORMAL HIGH (ref 0–99)
Triglycerides: 123 mg/dL (ref <150)
VLDL: 25 mg/dL (ref 0–40)

## 2010-11-07 LAB — HEPATIC FUNCTION PANEL
Albumin: 4 g/dL (ref 3.5–5.2)
Total Protein: 7.6 g/dL (ref 6.0–8.3)

## 2010-11-07 LAB — BASIC METABOLIC PANEL
Calcium: 9.8 mg/dL (ref 8.4–10.5)
Glucose, Bld: 174 mg/dL — ABNORMAL HIGH (ref 70–99)
Sodium: 143 mEq/L (ref 135–145)

## 2010-11-07 LAB — PHOSPHORUS: Phosphorus: 3.9 mg/dL (ref 2.3–4.6)

## 2010-11-13 ENCOUNTER — Other Ambulatory Visit: Payer: Medicaid Other

## 2010-11-13 ENCOUNTER — Other Ambulatory Visit: Payer: Self-pay | Admitting: Internal Medicine

## 2010-11-13 DIAGNOSIS — E211 Secondary hyperparathyroidism, not elsewhere classified: Secondary | ICD-10-CM

## 2010-11-13 DIAGNOSIS — E785 Hyperlipidemia, unspecified: Secondary | ICD-10-CM

## 2010-11-13 DIAGNOSIS — I10 Essential (primary) hypertension: Secondary | ICD-10-CM

## 2010-11-13 LAB — BASIC METABOLIC PANEL WITH GFR
BUN: 25 mg/dL — ABNORMAL HIGH (ref 6–23)
CO2: 25 meq/L (ref 19–32)
Calcium: 8.8 mg/dL (ref 8.4–10.5)
Chloride: 110 meq/L (ref 96–112)
Creat: 1.9 mg/dL — ABNORMAL HIGH (ref 0.40–1.20)
Glucose, Bld: 177 mg/dL — ABNORMAL HIGH (ref 70–99)
Potassium: 4.5 meq/L (ref 3.5–5.3)
Sodium: 143 meq/L (ref 135–145)

## 2010-11-13 NOTE — Telephone Encounter (Signed)
RTC from pt given instructions to take 1/2 tablet of the Simvastatin daily.  Pt's companion [name] voiced an understanding of the plan.

## 2010-11-13 NOTE — Telephone Encounter (Signed)
Pt to take 1/2 tablet of simvastatin 40mg  daily. Please add vit D level ordered to labs drawn during office visit if possible.

## 2010-11-13 NOTE — Telephone Encounter (Signed)
Attempts to call pt.  Message left on pt's answering machine to call the Clinics.

## 2010-11-15 ENCOUNTER — Telehealth: Payer: Self-pay | Admitting: Internal Medicine

## 2010-11-15 DIAGNOSIS — E211 Secondary hyperparathyroidism, not elsewhere classified: Secondary | ICD-10-CM

## 2010-11-15 MED ORDER — SIMVASTATIN 40 MG PO TABS: 20.0000 mg | ORAL_TABLET | Freq: Every day | ORAL | Status: DC

## 2010-11-15 MED ORDER — ERGOCALCIFEROL 1.25 MG (50000 UT) PO CAPS: 50000.0000 [IU] | ORAL_CAPSULE | ORAL | Status: DC

## 2010-11-15 NOTE — Telephone Encounter (Signed)
Please call patient and let her know that her Vitamin D level is still a bit low and that I have prescribed high dose vitamin D for her to take as directed (needs to be called in to her pharmacy).  Thanks

## 2010-11-18 ENCOUNTER — Other Ambulatory Visit: Payer: Self-pay | Admitting: *Deleted

## 2010-11-18 NOTE — Telephone Encounter (Signed)
Prescription for Vitamin D 50,000 units called to [facility] per order of dr. Philbert Riser.  Spoke to pt and roommate about the need to restart.  Voiced understanding of.

## 2010-11-19 ENCOUNTER — Encounter: Payer: Self-pay | Admitting: Internal Medicine

## 2010-11-20 MED ORDER — AMLODIPINE BESYLATE 10 MG PO TABS: 10.0000 mg | ORAL_TABLET | Freq: Every day | ORAL | Status: DC

## 2010-12-11 LAB — GLUCOSE, CAPILLARY: Glucose-Capillary: 155 mg/dL — ABNORMAL HIGH (ref 70–99)

## 2010-12-12 ENCOUNTER — Encounter: Payer: Self-pay | Admitting: Internal Medicine

## 2010-12-12 LAB — GLUCOSE, CAPILLARY: Glucose-Capillary: 93 mg/dL (ref 70–99)

## 2010-12-15 LAB — CBC
HCT: 35.7 % — ABNORMAL LOW (ref 36.0–46.0)
Hemoglobin: 12.1 g/dL (ref 12.0–15.0)
MCHC: 33.9 g/dL (ref 30.0–36.0)
MCV: 93.4 fL (ref 78.0–100.0)
Platelets: 129 10*3/uL — ABNORMAL LOW (ref 150–400)
RBC: 3.83 MIL/uL — ABNORMAL LOW (ref 3.87–5.11)
RDW: 13.7 % (ref 11.5–15.5)
WBC: 8.6 10*3/uL (ref 4.0–10.5)

## 2010-12-15 LAB — GLUCOSE, CAPILLARY
Glucose-Capillary: 54 mg/dL — ABNORMAL LOW (ref 70–99)
Glucose-Capillary: 59 mg/dL — ABNORMAL LOW (ref 70–99)

## 2010-12-15 LAB — COMPREHENSIVE METABOLIC PANEL
ALT: 19 U/L (ref 0–35)
AST: 21 U/L (ref 0–37)
Albumin: 3.6 g/dL (ref 3.5–5.2)
Alkaline Phosphatase: 84 U/L (ref 39–117)
BUN: 26 mg/dL — ABNORMAL HIGH (ref 6–23)
CO2: 22 mEq/L (ref 19–32)
Calcium: 9.4 mg/dL (ref 8.4–10.5)
Chloride: 109 mEq/L (ref 96–112)
Creatinine, Ser: 1.76 mg/dL — ABNORMAL HIGH (ref 0.4–1.2)
GFR calc Af Amer: 36 mL/min — ABNORMAL LOW (ref >60)
GFR calc non Af Amer: 30 mL/min — ABNORMAL LOW (ref >60)
Glucose, Bld: 68 mg/dL — ABNORMAL LOW (ref 70–99)
Potassium: 3.7 mEq/L (ref 3.5–5.1)
Sodium: 139 mEq/L (ref 135–145)
Total Bilirubin: 0.5 mg/dL (ref 0.3–1.2)
Total Protein: 7.3 g/dL (ref 6.0–8.3)

## 2010-12-15 LAB — DIFFERENTIAL
Basophils Absolute: 0.1 10*3/uL (ref 0.0–0.1)
Basophils Relative: 1 % (ref 0–1)
Eosinophils Absolute: 0.4 10*3/uL (ref 0.0–0.7)
Eosinophils Relative: 5 % (ref 0–5)
Lymphocytes Relative: 33 % (ref 12–46)
Lymphs Abs: 2.8 10*3/uL (ref 0.7–4.0)
Monocytes Absolute: 0.6 10*3/uL (ref 0.1–1.0)
Monocytes Relative: 7 % (ref 3–12)
Neutro Abs: 4.7 10*3/uL (ref 1.7–7.7)
Neutrophils Relative %: 55 % (ref 43–77)

## 2010-12-15 LAB — CK TOTAL AND CKMB (NOT AT ARMC)
CK, MB: 3.8 ng/mL (ref 0.3–4.0)
Relative Index: INVALID (ref 0.0–2.5)
Total CK: 68 U/L (ref 7–177)

## 2010-12-15 LAB — TROPONIN I: Troponin I: 0.02 ng/mL (ref 0.00–0.06)

## 2010-12-16 LAB — BLOOD GAS, ARTERIAL
O2 Content: 3 L/min
O2 Saturation: 95.6 %
Patient temperature: 98.6
TCO2: 16.1 mmol/L (ref 0–100)
pCO2 arterial: 22.8 mmHg — ABNORMAL LOW (ref 35.0–45.0)
pH, Arterial: 7.442 — ABNORMAL HIGH (ref 7.350–7.400)
pO2, Arterial: 72.5 mmHg — ABNORMAL LOW (ref 80.0–100.0)

## 2010-12-16 LAB — COMPREHENSIVE METABOLIC PANEL WITH GFR
ALT: 14 U/L (ref 0–35)
ALT: 35 U/L (ref 0–35)
AST: 29 U/L (ref 0–37)
AST: 94 U/L — ABNORMAL HIGH (ref 0–37)
Albumin: 2.9 g/dL — ABNORMAL LOW (ref 3.5–5.2)
Albumin: 3.2 g/dL — ABNORMAL LOW (ref 3.5–5.2)
Alkaline Phosphatase: 72 U/L (ref 39–117)
Alkaline Phosphatase: 78 U/L (ref 39–117)
BUN: 18 mg/dL (ref 6–23)
BUN: 18 mg/dL (ref 6–23)
CO2: 19 meq/L (ref 19–32)
CO2: 20 meq/L (ref 19–32)
Calcium: 8.1 mg/dL — ABNORMAL LOW (ref 8.4–10.5)
Calcium: 8.5 mg/dL (ref 8.4–10.5)
Chloride: 106 meq/L (ref 96–112)
Chloride: 106 meq/L (ref 96–112)
Creatinine, Ser: 2.14 mg/dL — ABNORMAL HIGH (ref 0.4–1.2)
Creatinine, Ser: 2.35 mg/dL — ABNORMAL HIGH (ref 0.4–1.2)
GFR calc non Af Amer: 21 mL/min — ABNORMAL LOW
GFR calc non Af Amer: 24 mL/min — ABNORMAL LOW
Glucose, Bld: 143 mg/dL — ABNORMAL HIGH (ref 70–99)
Glucose, Bld: 179 mg/dL — ABNORMAL HIGH (ref 70–99)
Potassium: 3.4 meq/L — ABNORMAL LOW (ref 3.5–5.1)
Potassium: 4.1 meq/L (ref 3.5–5.1)
Sodium: 135 meq/L (ref 135–145)
Sodium: 135 meq/L (ref 135–145)
Total Bilirubin: 1.5 mg/dL — ABNORMAL HIGH (ref 0.3–1.2)
Total Bilirubin: 1.7 mg/dL — ABNORMAL HIGH (ref 0.3–1.2)
Total Protein: 7.3 g/dL (ref 6.0–8.3)
Total Protein: 7.4 g/dL (ref 6.0–8.3)

## 2010-12-16 LAB — CBC
HCT: 25.3 % — ABNORMAL LOW (ref 36.0–46.0)
HCT: 26.9 % — ABNORMAL LOW (ref 36.0–46.0)
HCT: 27.7 % — ABNORMAL LOW (ref 36.0–46.0)
HCT: 29 % — ABNORMAL LOW (ref 36.0–46.0)
Hemoglobin: 12 g/dL (ref 12.0–15.0)
Hemoglobin: 8.4 g/dL — ABNORMAL LOW (ref 12.0–15.0)
Hemoglobin: 9.3 g/dL — ABNORMAL LOW (ref 12.0–15.0)
Hemoglobin: 9.5 g/dL — ABNORMAL LOW (ref 12.0–15.0)
MCHC: 33.4 g/dL (ref 30.0–36.0)
MCHC: 34.3 g/dL (ref 30.0–36.0)
MCHC: 34.5 g/dL (ref 30.0–36.0)
MCV: 87.7 fL (ref 78.0–100.0)
MCV: 88.2 fL (ref 78.0–100.0)
MCV: 88.3 fL (ref 78.0–100.0)
MCV: 88.7 fL (ref 78.0–100.0)
Platelets: 128 10*3/uL — ABNORMAL LOW (ref 150–400)
Platelets: 140 10*3/uL — ABNORMAL LOW (ref 150–400)
Platelets: 153 10*3/uL (ref 150–400)
Platelets: 198 10*3/uL (ref 150–400)
RBC: 2.85 MIL/uL — ABNORMAL LOW (ref 3.87–5.11)
RBC: 2.98 MIL/uL — ABNORMAL LOW (ref 3.87–5.11)
RBC: 3.14 MIL/uL — ABNORMAL LOW (ref 3.87–5.11)
RBC: 3.28 MIL/uL — ABNORMAL LOW (ref 3.87–5.11)
RBC: 3.89 MIL/uL (ref 3.87–5.11)
RDW: 14.2 % (ref 11.5–15.5)
RDW: 14.5 % (ref 11.5–15.5)
RDW: 14.6 % (ref 11.5–15.5)
RDW: 14.9 % (ref 11.5–15.5)
RDW: 15.3 % (ref 11.5–15.5)
RDW: 15.8 % — ABNORMAL HIGH (ref 11.5–15.5)
RDW: 15.9 % — ABNORMAL HIGH (ref 11.5–15.5)
WBC: 10.4 10*3/uL (ref 4.0–10.5)
WBC: 10.6 10*3/uL — ABNORMAL HIGH (ref 4.0–10.5)
WBC: 11.6 10*3/uL — ABNORMAL HIGH (ref 4.0–10.5)
WBC: 12.9 10*3/uL — ABNORMAL HIGH (ref 4.0–10.5)
WBC: 8.7 10*3/uL (ref 4.0–10.5)

## 2010-12-16 LAB — URINALYSIS, MICROSCOPIC ONLY
Glucose, UA: 100 mg/dL — AB
Ketones, ur: NEGATIVE mg/dL
Leukocytes, UA: NEGATIVE
Nitrite: NEGATIVE
Protein, ur: >300 mg/dL — AB
Specific Gravity, Urine: 1.016 (ref 1.005–1.030)
Urobilinogen, UA: 1 mg/dL (ref 0.0–1.0)
pH: 5.5 (ref 5.0–8.0)

## 2010-12-16 LAB — GLUCOSE, CAPILLARY
Glucose-Capillary: 145 mg/dL — ABNORMAL HIGH (ref 70–99)
Glucose-Capillary: 102 mg/dL — ABNORMAL HIGH (ref 70–99)
Glucose-Capillary: 109 mg/dL — ABNORMAL HIGH (ref 70–99)
Glucose-Capillary: 110 mg/dL — ABNORMAL HIGH (ref 70–99)
Glucose-Capillary: 111 mg/dL — ABNORMAL HIGH (ref 70–99)
Glucose-Capillary: 112 mg/dL — ABNORMAL HIGH (ref 70–99)
Glucose-Capillary: 127 mg/dL — ABNORMAL HIGH (ref 70–99)
Glucose-Capillary: 133 mg/dL — ABNORMAL HIGH (ref 70–99)
Glucose-Capillary: 136 mg/dL — ABNORMAL HIGH (ref 70–99)
Glucose-Capillary: 139 mg/dL — ABNORMAL HIGH (ref 70–99)
Glucose-Capillary: 148 mg/dL — ABNORMAL HIGH (ref 70–99)
Glucose-Capillary: 154 mg/dL — ABNORMAL HIGH (ref 70–99)
Glucose-Capillary: 155 mg/dL — ABNORMAL HIGH (ref 70–99)
Glucose-Capillary: 160 mg/dL — ABNORMAL HIGH (ref 70–99)
Glucose-Capillary: 174 mg/dL — ABNORMAL HIGH (ref 70–99)
Glucose-Capillary: 186 mg/dL — ABNORMAL HIGH (ref 70–99)
Glucose-Capillary: 208 mg/dL — ABNORMAL HIGH (ref 70–99)
Glucose-Capillary: 221 mg/dL — ABNORMAL HIGH (ref 70–99)
Glucose-Capillary: 232 mg/dL — ABNORMAL HIGH (ref 70–99)
Glucose-Capillary: 81 mg/dL (ref 70–99)
Glucose-Capillary: 88 mg/dL (ref 70–99)
Glucose-Capillary: 89 mg/dL (ref 70–99)
Glucose-Capillary: 91 mg/dL (ref 70–99)
Glucose-Capillary: 91 mg/dL (ref 70–99)
Glucose-Capillary: 92 mg/dL (ref 70–99)

## 2010-12-16 LAB — BASIC METABOLIC PANEL
BUN: 20 mg/dL (ref 6–23)
BUN: 22 mg/dL (ref 6–23)
CO2: 21 mEq/L (ref 19–32)
Calcium: 7.4 mg/dL — ABNORMAL LOW (ref 8.4–10.5)
Calcium: 8.3 mg/dL — ABNORMAL LOW (ref 8.4–10.5)
Chloride: 110 mEq/L (ref 96–112)
Chloride: 111 mEq/L (ref 96–112)
Chloride: 111 mEq/L (ref 96–112)
Chloride: 113 mEq/L — ABNORMAL HIGH (ref 96–112)
Creatinine, Ser: 1.57 mg/dL — ABNORMAL HIGH (ref 0.4–1.2)
Creatinine, Ser: 1.85 mg/dL — ABNORMAL HIGH (ref 0.4–1.2)
Creatinine, Ser: 2.48 mg/dL — ABNORMAL HIGH (ref 0.4–1.2)
GFR calc Af Amer: 24 mL/min — ABNORMAL LOW (ref >60)
GFR calc Af Amer: 34 mL/min — ABNORMAL LOW (ref >60)
GFR calc Af Amer: 41 mL/min — ABNORMAL LOW (ref >60)
GFR calc non Af Amer: 21 mL/min — ABNORMAL LOW (ref >60)
GFR calc non Af Amer: 28 mL/min — ABNORMAL LOW (ref >60)
Glucose, Bld: 100 mg/dL — ABNORMAL HIGH (ref 70–99)
Glucose, Bld: 127 mg/dL — ABNORMAL HIGH (ref 70–99)
Potassium: 3.3 mEq/L — ABNORMAL LOW (ref 3.5–5.1)
Potassium: 3.3 mEq/L — ABNORMAL LOW (ref 3.5–5.1)
Potassium: 3.4 mEq/L — ABNORMAL LOW (ref 3.5–5.1)
Potassium: 3.5 mEq/L (ref 3.5–5.1)
Sodium: 135 mEq/L (ref 135–145)
Sodium: 136 mEq/L (ref 135–145)
Sodium: 141 mEq/L (ref 135–145)

## 2010-12-16 LAB — URINALYSIS, ROUTINE W REFLEX MICROSCOPIC
Bilirubin Urine: NEGATIVE
Glucose, UA: 100 mg/dL — AB
Ketones, ur: NEGATIVE mg/dL
Protein, ur: 100 mg/dL — AB

## 2010-12-16 LAB — DIFFERENTIAL
Basophils Absolute: 0 10*3/uL (ref 0.0–0.1)
Basophils Absolute: 0 10*3/uL (ref 0.0–0.1)
Basophils Relative: 0 % (ref 0–1)
Basophils Relative: 0 % (ref 0–1)
Eosinophils Absolute: 0 10*3/uL (ref 0.0–0.7)
Eosinophils Absolute: 0 10*3/uL (ref 0.0–0.7)
Eosinophils Absolute: 0 10*3/uL (ref 0.0–0.7)
Eosinophils Relative: 0 % (ref 0–5)
Eosinophils Relative: 0 % (ref 0–5)
Lymphocytes Relative: 5 % — ABNORMAL LOW (ref 12–46)
Lymphs Abs: 0.7 10*3/uL (ref 0.7–4.0)
Monocytes Absolute: 0.6 10*3/uL (ref 0.1–1.0)
Monocytes Relative: 6 % (ref 3–12)
Neutro Abs: 11.1 10*3/uL — ABNORMAL HIGH (ref 1.7–7.7)
Neutrophils Relative %: 86 % — ABNORMAL HIGH (ref 43–77)

## 2010-12-16 LAB — CARDIAC PANEL(CRET KIN+CKTOT+MB+TROPI)
CK, MB: 10.1 ng/mL (ref 0.3–4.0)
CK, MB: 10.2 ng/mL (ref 0.3–4.0)
CK, MB: 7.3 ng/mL (ref 0.3–4.0)
CK, MB: 7.9 ng/mL (ref 0.3–4.0)
CK, MB: 9 ng/mL (ref 0.3–4.0)
CK, MB: 9.9 ng/mL (ref 0.3–4.0)
Relative Index: 0.7 (ref 0.0–2.5)
Relative Index: 0.8 (ref 0.0–2.5)
Relative Index: 0.9 (ref 0.0–2.5)
Relative Index: 1 (ref 0.0–2.5)
Relative Index: 1 (ref 0.0–2.5)
Relative Index: 1.1 (ref 0.0–2.5)
Relative Index: 1.4 (ref 0.0–2.5)
Relative Index: 1.9 (ref 0.0–2.5)
Total CK: 1048 U/L — ABNORMAL HIGH (ref 7–177)
Total CK: 1083 U/L — ABNORMAL HIGH (ref 7–177)
Total CK: 1143 U/L — ABNORMAL HIGH (ref 7–177)
Total CK: 634 U/L — ABNORMAL HIGH (ref 7–177)
Total CK: 774 U/L — ABNORMAL HIGH (ref 7–177)
Total CK: 922 U/L — ABNORMAL HIGH (ref 7–177)
Troponin I: 0.05 ng/mL (ref 0.00–0.06)
Troponin I: 0.09 ng/mL — ABNORMAL HIGH (ref 0.00–0.06)
Troponin I: 0.09 ng/mL — ABNORMAL HIGH (ref 0.00–0.06)
Troponin I: 0.12 ng/mL — ABNORMAL HIGH (ref 0.00–0.06)
Troponin I: 0.14 ng/mL — ABNORMAL HIGH (ref 0.00–0.06)
Troponin I: 0.14 ng/mL — ABNORMAL HIGH (ref 0.00–0.06)
Troponin I: 0.16 ng/mL — ABNORMAL HIGH (ref 0.00–0.06)

## 2010-12-16 LAB — MAGNESIUM
Magnesium: 1.3 mg/dL — ABNORMAL LOW (ref 1.5–2.5)
Magnesium: 1.6 mg/dL (ref 1.5–2.5)
Magnesium: 1.7 mg/dL (ref 1.5–2.5)
Magnesium: 2.1 mg/dL (ref 1.5–2.5)

## 2010-12-16 LAB — BRAIN NATRIURETIC PEPTIDE
Pro B Natriuretic peptide (BNP): 1487 pg/mL — ABNORMAL HIGH (ref 0.0–100.0)
Pro B Natriuretic peptide (BNP): 2123 pg/mL — ABNORMAL HIGH (ref 0.0–100.0)

## 2010-12-16 LAB — COMPREHENSIVE METABOLIC PANEL
ALT: 46 U/L — ABNORMAL HIGH (ref 0–35)
ALT: 46 U/L — ABNORMAL HIGH (ref 0–35)
ALT: 48 U/L — ABNORMAL HIGH (ref 0–35)
AST: 103 U/L — ABNORMAL HIGH (ref 0–37)
Albumin: 2.2 g/dL — ABNORMAL LOW (ref 3.5–5.2)
Alkaline Phosphatase: 73 U/L (ref 39–117)
Alkaline Phosphatase: 75 U/L (ref 39–117)
Alkaline Phosphatase: 82 U/L (ref 39–117)
Alkaline Phosphatase: 94 U/L (ref 39–117)
BUN: 20 mg/dL (ref 6–23)
BUN: 22 mg/dL (ref 6–23)
BUN: 23 mg/dL (ref 6–23)
CO2: 14 mEq/L — ABNORMAL LOW (ref 19–32)
CO2: 15 mEq/L — ABNORMAL LOW (ref 19–32)
CO2: 16 mEq/L — ABNORMAL LOW (ref 19–32)
CO2: 17 mEq/L — ABNORMAL LOW (ref 19–32)
Calcium: 7.2 mg/dL — ABNORMAL LOW (ref 8.4–10.5)
Chloride: 107 mEq/L (ref 96–112)
Chloride: 111 mEq/L (ref 96–112)
Creatinine, Ser: 2.59 mg/dL — ABNORMAL HIGH (ref 0.4–1.2)
GFR calc Af Amer: 26 mL/min — ABNORMAL LOW (ref >60)
GFR calc Af Amer: 30 mL/min — ABNORMAL LOW (ref >60)
GFR calc non Af Amer: 19 mL/min — ABNORMAL LOW (ref >60)
GFR calc non Af Amer: 21 mL/min — ABNORMAL LOW (ref >60)
GFR calc non Af Amer: 21 mL/min — ABNORMAL LOW (ref >60)
GFR calc non Af Amer: 24 mL/min — ABNORMAL LOW (ref >60)
Glucose, Bld: 135 mg/dL — ABNORMAL HIGH (ref 70–99)
Glucose, Bld: 87 mg/dL (ref 70–99)
Glucose, Bld: 87 mg/dL (ref 70–99)
Potassium: 3.3 mEq/L — ABNORMAL LOW (ref 3.5–5.1)
Potassium: 3.7 mEq/L (ref 3.5–5.1)
Potassium: 3.7 mEq/L (ref 3.5–5.1)
Sodium: 131 mEq/L — ABNORMAL LOW (ref 135–145)
Sodium: 133 mEq/L — ABNORMAL LOW (ref 135–145)
Sodium: 135 mEq/L (ref 135–145)
Total Bilirubin: 1.5 mg/dL — ABNORMAL HIGH (ref 0.3–1.2)
Total Bilirubin: 1.8 mg/dL — ABNORMAL HIGH (ref 0.3–1.2)
Total Bilirubin: 2.2 mg/dL — ABNORMAL HIGH (ref 0.3–1.2)
Total Protein: 5.8 g/dL — ABNORMAL LOW (ref 6.0–8.3)
Total Protein: 6.2 g/dL (ref 6.0–8.3)

## 2010-12-16 LAB — TSH: TSH: 0.443 u[IU]/mL (ref 0.350–4.500)

## 2010-12-16 LAB — CULTURE, BLOOD (ROUTINE X 2)
Culture: NO GROWTH
Culture: NO GROWTH

## 2010-12-16 LAB — HEPATITIS PANEL, ACUTE
HCV Ab: NEGATIVE
Hep A IgM: NEGATIVE
Hep B C IgM: NEGATIVE
Hepatitis B Surface Ag: NEGATIVE

## 2010-12-16 LAB — URINE MICROSCOPIC-ADD ON

## 2010-12-16 LAB — MRSA PCR SCREENING: MRSA by PCR: NEGATIVE

## 2010-12-16 LAB — HIV ANTIBODY (ROUTINE TESTING W REFLEX): HIV: NONREACTIVE

## 2010-12-16 LAB — CK TOTAL AND CKMB (NOT AT ARMC)
CK, MB: 1.9 ng/mL (ref 0.3–4.0)
Relative Index: 0.9 (ref 0.0–2.5)
Total CK: 201 U/L — ABNORMAL HIGH (ref 7–177)

## 2010-12-16 LAB — PROCALCITONIN
Procalcitonin: 4.42 ng/mL
Procalcitonin: 5.15 ng/mL

## 2010-12-16 LAB — LIPASE, BLOOD: Lipase: 20 U/L (ref 11–59)

## 2010-12-16 LAB — LACTIC ACID, PLASMA: Lactic Acid, Venous: 1.8 mmol/L (ref 0.5–2.2)

## 2010-12-16 LAB — TROPONIN I: Troponin I: 0.07 ng/mL — ABNORMAL HIGH (ref 0.00–0.06)

## 2010-12-18 LAB — DIFFERENTIAL
Basophils Absolute: 0 10*3/uL (ref 0.0–0.1)
Basophils Absolute: 0.1 K/uL (ref 0.0–0.1)
Basophils Relative: 0 % (ref 0–1)
Basophils Relative: 1 % (ref 0–1)
Eosinophils Absolute: 0.6 10*3/uL (ref 0.0–0.7)
Eosinophils Relative: 6 % — ABNORMAL HIGH (ref 0–5)
Lymphocytes Relative: 32 % (ref 12–46)
Lymphs Abs: 3.2 10*3/uL (ref 0.7–4.0)
Monocytes Absolute: 0.5 K/uL (ref 0.1–1.0)
Monocytes Relative: 5 % (ref 3–12)
Neutro Abs: 5.5 10*3/uL (ref 1.7–7.7)
Neutro Abs: 5.9 10*3/uL (ref 1.7–7.7)
Neutrophils Relative %: 56 % (ref 43–77)
Neutrophils Relative %: 64 % (ref 43–77)

## 2010-12-18 LAB — CBC
HCT: 26.9 % — ABNORMAL LOW (ref 36.0–46.0)
HCT: 30.1 % — ABNORMAL LOW (ref 36.0–46.0)
HCT: 30.2 % — ABNORMAL LOW (ref 36.0–46.0)
HCT: 30.3 % — ABNORMAL LOW (ref 36.0–46.0)
HCT: 32.2 % — ABNORMAL LOW (ref 36.0–46.0)
HCT: 32.4 % — ABNORMAL LOW (ref 36.0–46.0)
Hemoglobin: 10.2 g/dL — ABNORMAL LOW (ref 12.0–15.0)
Hemoglobin: 10.2 g/dL — ABNORMAL LOW (ref 12.0–15.0)
Hemoglobin: 10.3 g/dL — ABNORMAL LOW (ref 12.0–15.0)
Hemoglobin: 10.9 g/dL — ABNORMAL LOW (ref 12.0–15.0)
Hemoglobin: 11.4 g/dL — ABNORMAL LOW (ref 12.0–15.0)
Hemoglobin: 9.3 g/dL — ABNORMAL LOW (ref 12.0–15.0)
MCHC: 33.4 g/dL (ref 30.0–36.0)
MCHC: 33.6 g/dL (ref 30.0–36.0)
MCHC: 33.8 g/dL (ref 30.0–36.0)
MCV: 92.4 fL (ref 78.0–100.0)
MCV: 92.5 fL (ref 78.0–100.0)
MCV: 93.8 fL (ref 78.0–100.0)
Platelets: 117 10*3/uL — ABNORMAL LOW (ref 150–400)
Platelets: 125 10*3/uL — ABNORMAL LOW (ref 150–400)
Platelets: 157 10*3/uL (ref 150–400)
Platelets: 176 10*3/uL (ref 150–400)
Platelets: 205 K/uL (ref 150–400)
RBC: 2.9 MIL/uL — ABNORMAL LOW (ref 3.87–5.11)
RBC: 3.22 MIL/uL — ABNORMAL LOW (ref 3.87–5.11)
RDW: 13.7 % (ref 11.5–15.5)
RDW: 13.9 % (ref 11.5–15.5)
RDW: 14.1 % (ref 11.5–15.5)
RDW: 14.1 % (ref 11.5–15.5)
RDW: 14.3 % (ref 11.5–15.5)
RDW: 14.4 % (ref 11.5–15.5)
RDW: 14.4 % (ref 11.5–15.5)
WBC: 12.3 10*3/uL — ABNORMAL HIGH (ref 4.0–10.5)
WBC: 7.7 10*3/uL (ref 4.0–10.5)
WBC: 9 10*3/uL (ref 4.0–10.5)
WBC: 9.8 K/uL (ref 4.0–10.5)

## 2010-12-18 LAB — COMPREHENSIVE METABOLIC PANEL WITH GFR
AST: 23 U/L (ref 0–37)
Albumin: 3.1 g/dL — ABNORMAL LOW (ref 3.5–5.2)
Alkaline Phosphatase: 83 U/L (ref 39–117)
BUN: 41 mg/dL — ABNORMAL HIGH (ref 6–23)
CO2: 23 meq/L (ref 19–32)
Chloride: 104 meq/L (ref 96–112)
GFR calc Af Amer: 22 mL/min — ABNORMAL LOW (ref >60)
Potassium: 5 meq/L (ref 3.5–5.1)
Total Bilirubin: 0.8 mg/dL (ref 0.3–1.2)

## 2010-12-18 LAB — GLUCOSE, CAPILLARY
Glucose-Capillary: 110 mg/dL — ABNORMAL HIGH (ref 70–99)
Glucose-Capillary: 131 mg/dL — ABNORMAL HIGH (ref 70–99)
Glucose-Capillary: 132 mg/dL — ABNORMAL HIGH (ref 70–99)
Glucose-Capillary: 136 mg/dL — ABNORMAL HIGH (ref 70–99)
Glucose-Capillary: 149 mg/dL — ABNORMAL HIGH (ref 70–99)
Glucose-Capillary: 169 mg/dL — ABNORMAL HIGH (ref 70–99)
Glucose-Capillary: 181 mg/dL — ABNORMAL HIGH (ref 70–99)
Glucose-Capillary: 188 mg/dL — ABNORMAL HIGH (ref 70–99)
Glucose-Capillary: 189 mg/dL — ABNORMAL HIGH (ref 70–99)
Glucose-Capillary: 203 mg/dL — ABNORMAL HIGH (ref 70–99)
Glucose-Capillary: 231 mg/dL — ABNORMAL HIGH (ref 70–99)
Glucose-Capillary: 68 mg/dL — ABNORMAL LOW (ref 70–99)

## 2010-12-18 LAB — BASIC METABOLIC PANEL
BUN: 24 mg/dL — ABNORMAL HIGH (ref 6–23)
BUN: 27 mg/dL — ABNORMAL HIGH (ref 6–23)
BUN: 35 mg/dL — ABNORMAL HIGH (ref 6–23)
BUN: 42 mg/dL — ABNORMAL HIGH (ref 6–23)
CO2: 22 mEq/L (ref 19–32)
CO2: 23 mEq/L (ref 19–32)
CO2: 23 mEq/L (ref 19–32)
Calcium: 8.5 mg/dL (ref 8.4–10.5)
Calcium: 8.6 mg/dL (ref 8.4–10.5)
Calcium: 8.6 mg/dL (ref 8.4–10.5)
Chloride: 108 mEq/L (ref 96–112)
Chloride: 110 mEq/L (ref 96–112)
Chloride: 114 mEq/L — ABNORMAL HIGH (ref 96–112)
Creatinine, Ser: 1.65 mg/dL — ABNORMAL HIGH (ref 0.4–1.2)
Creatinine, Ser: 1.69 mg/dL — ABNORMAL HIGH (ref 0.4–1.2)
Creatinine, Ser: 2.21 mg/dL — ABNORMAL HIGH (ref 0.4–1.2)
Creatinine, Ser: 2.76 mg/dL — ABNORMAL HIGH (ref 0.4–1.2)
GFR calc non Af Amer: 31 mL/min — ABNORMAL LOW (ref >60)
Glucose, Bld: 115 mg/dL — ABNORMAL HIGH (ref 70–99)
Glucose, Bld: 120 mg/dL — ABNORMAL HIGH (ref 70–99)
Glucose, Bld: 128 mg/dL — ABNORMAL HIGH (ref 70–99)
Glucose, Bld: 156 mg/dL — ABNORMAL HIGH (ref 70–99)
Potassium: 4.2 mEq/L (ref 3.5–5.1)
Potassium: 4.3 mEq/L (ref 3.5–5.1)
Potassium: 4.5 mEq/L (ref 3.5–5.1)
Sodium: 139 mEq/L (ref 135–145)

## 2010-12-18 LAB — URINE CULTURE: Culture: NO GROWTH

## 2010-12-18 LAB — URINE MICROSCOPIC-ADD ON

## 2010-12-18 LAB — HEMOGLOBIN A1C
Hgb A1c MFr Bld: 6.1 % (ref 4.6–6.1)
Hgb A1c MFr Bld: 6.6 % — ABNORMAL HIGH (ref 4.6–6.1)
Mean Plasma Glucose: 128 mg/dL
Mean Plasma Glucose: 143 mg/dL

## 2010-12-18 LAB — T4, FREE: Free T4: 0.96 ng/dL (ref 0.80–1.80)

## 2010-12-18 LAB — CULTURE, BLOOD (ROUTINE X 2)
Culture: NO GROWTH
Culture: NO GROWTH

## 2010-12-18 LAB — URINALYSIS, ROUTINE W REFLEX MICROSCOPIC
Bilirubin Urine: NEGATIVE
Glucose, UA: NEGATIVE mg/dL
Hgb urine dipstick: NEGATIVE
Specific Gravity, Urine: 1.016 (ref 1.005–1.030)
pH: 5.5 (ref 5.0–8.0)

## 2010-12-18 LAB — CULTURE, BAL-QUANTITATIVE W GRAM STAIN: Colony Count: >=100000

## 2010-12-18 LAB — PROTIME-INR
INR: 1.13 (ref 0.00–1.49)
Prothrombin Time: 14.4 seconds (ref 11.6–15.2)

## 2010-12-18 LAB — COMPREHENSIVE METABOLIC PANEL
ALT: 21 U/L (ref 0–35)
Alkaline Phosphatase: 94 U/L (ref 39–117)
BUN: 42 mg/dL — ABNORMAL HIGH (ref 6–23)
CO2: 26 mEq/L (ref 19–32)
Calcium: 8.1 mg/dL — ABNORMAL LOW (ref 8.4–10.5)
Chloride: 108 mEq/L (ref 96–112)
Creatinine, Ser: 2.74 mg/dL — ABNORMAL HIGH (ref 0.4–1.2)
GFR calc non Af Amer: 18 mL/min — ABNORMAL LOW (ref >60)
GFR calc non Af Amer: 24 mL/min — ABNORMAL LOW (ref >60)
Glucose, Bld: 176 mg/dL — ABNORMAL HIGH (ref 70–99)
Glucose, Bld: 290 mg/dL — ABNORMAL HIGH (ref 70–99)
Potassium: 4.8 mEq/L (ref 3.5–5.1)
Sodium: 135 mEq/L (ref 135–145)
Total Bilirubin: 0.7 mg/dL (ref 0.3–1.2)
Total Protein: 6.7 g/dL (ref 6.0–8.3)

## 2010-12-18 LAB — CARDIAC PANEL(CRET KIN+CKTOT+MB+TROPI)
CK, MB: 2.7 ng/mL (ref 0.3–4.0)
CK, MB: 3 ng/mL (ref 0.3–4.0)
CK, MB: 3.2 ng/mL (ref 0.3–4.0)
Relative Index: INVALID (ref 0.0–2.5)
Relative Index: INVALID (ref 0.0–2.5)
Total CK: 62 U/L (ref 7–177)
Total CK: 68 U/L (ref 7–177)
Troponin I: 0.02 ng/mL (ref 0.00–0.06)
Troponin I: 0.05 ng/mL (ref 0.00–0.06)
Troponin I: 0.1 ng/mL — ABNORMAL HIGH (ref 0.00–0.06)

## 2010-12-18 LAB — POCT I-STAT 3, ART BLOOD GAS (G3+)
O2 Saturation: 100 %
Patient temperature: 98.5
pCO2 arterial: 54.5 mmHg — ABNORMAL HIGH (ref 35.0–45.0)
pH, Arterial: 7.186 — CL (ref 7.350–7.400)

## 2010-12-18 LAB — CLOSTRIDIUM DIFFICILE EIA: C difficile Toxins A+B, EIA: NEGATIVE

## 2010-12-18 LAB — DRUGS OF ABUSE SCREEN W/O ALC, ROUTINE URINE
Amphetamine Screen, Ur: NEGATIVE
Barbiturate Quant, Ur: NEGATIVE
Benzodiazepines.: NEGATIVE
Methadone: NEGATIVE
Phencyclidine (PCP): NEGATIVE

## 2010-12-18 LAB — BLOOD GAS, ARTERIAL
Bicarbonate: 20.5 mEq/L (ref 20.0–24.0)
MECHVT: 400 mL
O2 Saturation: 99.4 %
PEEP: 5 cmH2O
Patient temperature: 98.6
RATE: 14 resp/min

## 2010-12-18 LAB — POCT I-STAT, CHEM 8
Hemoglobin: 10.5 g/dL — ABNORMAL LOW (ref 12.0–15.0)
Sodium: 141 mEq/L (ref 135–145)
TCO2: 24 mmol/L (ref 0–100)

## 2010-12-18 LAB — AMMONIA: Ammonia: 23 umol/L (ref 11–35)

## 2010-12-18 LAB — SAMPLE TO BLOOD BANK

## 2010-12-18 LAB — TSH: TSH: 2.265 u[IU]/mL (ref 0.350–4.500)

## 2010-12-18 LAB — APTT: aPTT: 32 seconds (ref 24–37)

## 2010-12-18 LAB — LIPID PANEL
Cholesterol: 146 mg/dL (ref 0–200)
LDL Cholesterol: 69 mg/dL (ref 0–99)
Triglycerides: 136 mg/dL (ref <150)
VLDL: 27 mg/dL (ref 0–40)

## 2010-12-18 LAB — MRSA PCR SCREENING: MRSA by PCR: NEGATIVE

## 2010-12-18 LAB — MAGNESIUM: Magnesium: 2 mg/dL (ref 1.5–2.5)

## 2010-12-18 LAB — LACTIC ACID, PLASMA: Lactic Acid, Venous: 3.5 mmol/L — ABNORMAL HIGH (ref 0.5–2.2)

## 2010-12-19 ENCOUNTER — Other Ambulatory Visit: Payer: Self-pay | Admitting: Internal Medicine

## 2010-12-20 LAB — BASIC METABOLIC PANEL
BUN: 27 mg/dL — ABNORMAL HIGH (ref 6–23)
CO2: 22 mEq/L (ref 19–32)
Chloride: 109 mEq/L (ref 96–112)
Glucose, Bld: 149 mg/dL — ABNORMAL HIGH (ref 70–99)
Potassium: 4.1 mEq/L (ref 3.5–5.1)

## 2010-12-20 LAB — GLUCOSE, CAPILLARY: Glucose-Capillary: 138 mg/dL — ABNORMAL HIGH (ref 70–99)

## 2010-12-25 ENCOUNTER — Ambulatory Visit (INDEPENDENT_AMBULATORY_CARE_PROVIDER_SITE_OTHER): Payer: Medicaid Other | Admitting: Internal Medicine

## 2010-12-25 ENCOUNTER — Encounter: Payer: Self-pay | Admitting: Internal Medicine

## 2010-12-25 DIAGNOSIS — I455 Other specified heart block: Secondary | ICD-10-CM

## 2010-12-25 DIAGNOSIS — F17200 Nicotine dependence, unspecified, uncomplicated: Secondary | ICD-10-CM

## 2010-12-25 DIAGNOSIS — I495 Sick sinus syndrome: Secondary | ICD-10-CM

## 2010-12-25 DIAGNOSIS — Z950 Presence of cardiac pacemaker: Secondary | ICD-10-CM

## 2010-12-25 NOTE — Assessment & Plan Note (Signed)
The patient is paced in the atrium 74% of the time and 81% of the time in the ventricle notwithstanding an AV delay of 300 ms
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
## 2010-12-25 NOTE — Assessment & Plan Note (Signed)
Encouraged again to stop smoking her try Nicorette gum

## 2010-12-25 NOTE — Patient Instructions (Signed)
Your physician recommends that you schedule a follow-up appointment in: 6 months with device clinic and 12 months with Dr Caryl Comes.

## 2010-12-25 NOTE — Assessment & Plan Note (Signed)
The patients pacemaker is functioning normal  The patient's device was interrogated.  The information was reviewed. No changes were made in the programming.

## 2010-12-26 ENCOUNTER — Other Ambulatory Visit: Payer: Self-pay | Admitting: Internal Medicine

## 2010-12-30 ENCOUNTER — Other Ambulatory Visit: Payer: Self-pay | Admitting: Internal Medicine

## 2010-12-30 DIAGNOSIS — Z1231 Encounter for screening mammogram for malignant neoplasm of breast: Secondary | ICD-10-CM

## 2011-01-03 LAB — DIFFERENTIAL
Basophils Absolute: 0 10*3/uL (ref 0.0–0.1)
Basophils Relative: 0 % (ref 0–1)
Lymphocytes Relative: 28 % (ref 12–46)
Monocytes Relative: 5 % (ref 3–12)
Neutro Abs: 4.7 10*3/uL (ref 1.7–7.7)
Neutrophils Relative %: 63 % (ref 43–77)

## 2011-01-03 LAB — ETHANOL: Alcohol, Ethyl (B): <5 mg/dL (ref 0–10)

## 2011-01-03 LAB — CBC
HCT: 37.5 % (ref 36.0–46.0)
Hemoglobin: 12.6 g/dL (ref 12.0–15.0)
MCHC: 33.5 g/dL (ref 30.0–36.0)
MCV: 92 fL (ref 78.0–100.0)
RDW: 13.5 % (ref 11.5–15.5)

## 2011-01-03 LAB — COMPREHENSIVE METABOLIC PANEL
Alkaline Phosphatase: 89 U/L (ref 39–117)
BUN: 15 mg/dL (ref 6–23)
Creatinine, Ser: 1.62 mg/dL — ABNORMAL HIGH (ref 0.4–1.2)
Glucose, Bld: 114 mg/dL — ABNORMAL HIGH (ref 70–99)
Potassium: 3.8 mEq/L (ref 3.5–5.1)
Total Protein: 7 g/dL (ref 6.0–8.3)

## 2011-01-03 LAB — APTT: aPTT: 27 seconds (ref 24–37)

## 2011-01-03 LAB — PROTIME-INR: INR: 1 (ref 0.00–1.49)

## 2011-01-22 ENCOUNTER — Other Ambulatory Visit: Payer: Self-pay | Admitting: *Deleted

## 2011-01-22 NOTE — Telephone Encounter (Signed)
Please order times per day and include diagnosis code.  Pt has Medicaid and received a letter to change her meter.

## 2011-01-27 ENCOUNTER — Ambulatory Visit
Admission: RE | Admit: 2011-01-27 | Discharge: 2011-01-27 | Disposition: A | Discharge status: Home / Self Care | Payer: Medicaid Other | Source: Ambulatory Visit | Attending: Internal Medicine | Admitting: Internal Medicine

## 2011-01-27 ENCOUNTER — Telehealth: Payer: Self-pay | Admitting: *Deleted

## 2011-01-27 DIAGNOSIS — Z1231 Encounter for screening mammogram for malignant neoplasm of breast: Secondary | ICD-10-CM

## 2011-01-27 MED ORDER — ACCU-CHEK MULTICLIX LANCETS MISC: Status: DC

## 2011-01-27 MED ORDER — CARVEDILOL 6.25 MG PO TABS: 6.2500 mg | ORAL_TABLET | Freq: Two times a day (BID) | ORAL | Status: DC

## 2011-01-27 MED ORDER — ACCU-CHEK AVIVA PLUS W/DEVICE KIT: 1.0000 | PACK | Freq: Three times a day (TID) | Status: DC

## 2011-01-27 MED ORDER — GLUCOSE BLOOD VI STRP: ORAL_STRIP | Status: DC

## 2011-01-29 ENCOUNTER — Other Ambulatory Visit: Payer: Self-pay | Admitting: *Deleted

## 2011-01-29 MED ORDER — "INSULIN SYRINGE-NEEDLE U-100 31G X 5/16"" 0.3 ML MISC": Status: DC

## 2011-02-07 ENCOUNTER — Encounter: Payer: Self-pay | Admitting: Internal Medicine

## 2011-02-07 ENCOUNTER — Ambulatory Visit (INDEPENDENT_AMBULATORY_CARE_PROVIDER_SITE_OTHER): Payer: Medicaid Other | Admitting: Internal Medicine

## 2011-02-07 VITALS — BP 116/74 | HR 94 | Temp 98.9°F | Ht 62.0 in | Wt 169.5 lb

## 2011-02-07 DIAGNOSIS — F17200 Nicotine dependence, unspecified, uncomplicated: Secondary | ICD-10-CM

## 2011-02-07 DIAGNOSIS — E785 Hyperlipidemia, unspecified: Secondary | ICD-10-CM

## 2011-02-07 DIAGNOSIS — E109 Type 1 diabetes mellitus without complications: Secondary | ICD-10-CM

## 2011-02-07 DIAGNOSIS — G44209 Tension-type headache, unspecified, not intractable: Secondary | ICD-10-CM

## 2011-02-07 DIAGNOSIS — I10 Essential (primary) hypertension: Secondary | ICD-10-CM

## 2011-02-07 LAB — POCT GLYCOSYLATED HEMOGLOBIN (HGB A1C): Hemoglobin A1C: 5.6

## 2011-02-07 MED ORDER — SIMVASTATIN 20 MG PO TABS: 10.0000 mg | ORAL_TABLET | Freq: Every day | ORAL | Status: DC

## 2011-02-07 MED ORDER — DIPHENOXYLATE-ATROPINE 2.5-0.025 MG/5ML PO LIQD: 10.0000 mL | Freq: Four times a day (QID) | ORAL | Status: DC | PRN

## 2011-02-07 NOTE — Progress Notes (Signed)
  Subjective:    Patient ID: ESHANTI MOSCONE, female    DOB: [birth year], 60 y.o.   MRN: [id number]  HPI  Ms. Riach is a 60 year old woman who comes to clinic today complaining of headaches for the last 2 weeks.  The pain is mostly in the back of her head and comes and goes.  She states that sometimes they come out of no where and sometimes when she moves her head.  Denies any flashes of light, seizure activity, numbness, tingling, weakness, vision loss, or loss of consciousness with these episodes.  She does have some mild nausea and light sensitivity during the episodes.  She hasn't tried any medications for this.    She also saw her podiatrist 2 days prior to her visit for follow up of her amputations and he noted a small ulcer on the lateral base of her left toes.    She is also due for several diabetes monitoring of her diabetes including A1C and her foot exam.  She also has several medications that need refills today.   Review of Systems    Constitutional: Denies fever, chills, diaphoresis, appetite change and fatigue.  HEENT: Denies photophobia, eye pain, redness, hearing loss, ear pain, congestion, sore throat, rhinorrhea, sneezing, mouth sores, trouble swallowing, neck pain, neck stiffness and tinnitus.   Respiratory: Denies SOB, DOE, cough, chest tightness,  and wheezing.   Cardiovascular: Denies chest pain, palpitations and leg swelling.  Gastrointestinal: Denies nausea, vomiting, abdominal pain, diarrhea, constipation, blood in stool and abdominal distention.  Genitourinary: Denies dysuria, urgency, frequency, hematuria, flank pain and difficulty urinating.  Musculoskeletal: Denies myalgias, back pain, joint swelling, arthralgias and gait problem.  Skin: Denies pallor, rash and wound.  Neurological: Positive for headaches. Denies dizziness, seizures, syncope, weakness, light-headedness, numbness.  Hematological: Denies adenopathy. Easy bruising, personal or family bleeding history    Psychiatric/Behavioral: Denies suicidal ideation, mood changes, confusion, nervousness, sleep disturbance and agitation  Objective:   Physical Exam    Constitutional: Vital signs reviewed.  Patient is a well-developed and well-nourished woman in no acute distress and cooperative with exam. Alert and oriented x3.  Head: Normocephalic and atraumatic Ear: TM normal bilaterally Mouth: no erythema or exudates, MMM Eyes: PERRL, EOMI, conjunctivae normal, No scleral icterus.  Neck: ROM limited by pain in all planes. Pain to palpation over the paraspinous muscles in the neck as well as the insertion point of the trapezius at the base of the skull.  Pressing on the points in her neck reproduce the pain she is describing. Trachea midline  No JVD, mass, thyromegaly, or carotid bruit present.  Cardiovascular: RRR with occasional ectopic beats, S1 normal, S2 normal, no MRG, pulses symmetric and intact bilaterally Pulmonary/Chest: CTAB, no wheezes, rales, or rhonchi Abdominal: Soft. Non-tender, non-distended, bowel sounds are normal, no masses, organomegaly, or guarding present.  GU: no CVA tenderness Musculoskeletal: No joint deformities, erythema, or stiffness, ROM full and no nontender Hematology: no cervical, inginal, or axillary adenopathy.  Neurological: A&O x3, Strenght is normal and symmetric bilaterally, cranial nerve II-XII are grossly intact, no focal motor deficit, sensory intact to light touch bilaterally.  Skin: Warm, dry and intact. No rash, cyanosis, or clubbing.  Psychiatric: Normal mood and affect. speech and behavior is normal. Judgment and thought content normal. Cognition and memory are normal.    Assessment & Plan:

## 2011-02-07 NOTE — Patient Instructions (Signed)
Tension Headache (Muscle Contraction Headache) Tension headache is a common cause of head pain. You may feel pain in the top of the head and back of the neck. Stress, feeling worried (anxiety), and feeling sad for a long time (depression) can cause these headaches. This is not life-threatening. It will not lead to other types of headaches. Your doctor might do an exam, tests, or take an X-ray to figure out what is wrong. Your doctor might give you medicine (antidepressants) if depression is a problem. He or she can suggest ways to get help for problems caused by stress or worry. HOME CARE  Only take medicine as told by your doctor.   Helpful ways to relax are:   Using thoughts to control the body (biofeedback).   Massage.   You may put ice or heat packs on the head and neck area.   Put ice in a plastic bag.   Place a towel between your skin and the bag.   Leave the ice on for 20 minutes at a time, 3-4 times a day.   Physical therapy may help.   If headaches continue, you may need to make lifestyle changes.   Avoid using too many pain killers. This can cause more headaches.  FINDING OUT THE RESULTS OF YOUR TEST Ask your doctor when your test results will be ready. Make sure you follow up and get your test results. 1.  GET HELP IF:  You have problems with your medicines or they do not help.   Your headache changes.   You feel sick to your stomach (nauseous) or throw up (vomit).  GET HELP RIGHT AWAY IF:  Your headache gets very bad.   You have a temperature by mouth above 100.68F, not controlled by medicine.   You have a stiff neck, muscle weakness, or loss of muscle control.   You start having new problems (symptoms).   You lose your balance, vision, or have trouble walking.   You feel faint or pass out.  MAKE SURE YOU:  Understand these instructions.   Will watch your condition.   Will get help right away if you are not doing well or get worse.  Document  Released: 12/10/2009  [facility] Patient Information 2011 [place].  You can use Tylenol as directed on the label for the headaches.    Stretching and heat and ice packs are helpful as well to help relieve the headaches.  Whichever works best is the one to continue to use.    Continue all your other medications as prescribed.  Keep your appointments with the foot doctor.  If you notice fevers, chills, or redness or increased pain in the foot please contact him sooner.  Follow up with your PCP in 1-2 months.

## 2011-02-11 NOTE — Procedures (Signed)
BYPASS GRAFT EVALUATION   INDICATION:  Follow up bilateral lower extremity bypass grafts.   HISTORY:  Diabetes:  Yes.  Cardiac:  Arrhythmia.  Hypertension:  Yes.  Smoking:  Yes.  Previous Surgery:  Left fem-pop bypass graft in 2002, right fem-pop  bypass graft in 2003, both grafts were placed by Dr. Allean Found.   SINGLE LEVEL ARTERIAL EXAM                               RIGHT              LEFT  Brachial:                    167                158  Anterior tibial:             143                102  Posterior tibial:            143                105  Peroneal:  Ankle/brachial index:        0.86               0.63   PREVIOUS ABI:  Date: 02/19/09  RIGHT:  1.05  LEFT:  0.93   LOWER EXTREMITY BYPASS GRAFT DUPLEX EXAM:   DUPLEX:  Biphasic waveforms proximal, within, and distal to the right  femoral popliteal artery bypass graft.  The distal native artery has  elevated velocities of 277 cm/s.  The left femoral popliteal artery  bypass graft has biphasic and monophasic waveforms noted within the  graft.  The distal left native vessel has elevated velocities of 504  cm/s.   IMPRESSION:  1. The ankle brachial indices have dropped bilaterally from the      previous study.  2. The right ankle brachial index is near-normal, and the left ankle      brachial index has mild-to-moderate arterial disease.  3. Patent bilateral femoral-popliteal bypass grafts with increased      velocities as noted above in the distal native arteries.   ___________________________________________  Judeth Cornfield. Scot Dock, M.D.   CB/MEDQ  D:  08/16/2009  T:  08/16/2009  Job:  [id number]

## 2011-02-11 NOTE — Procedures (Signed)
BYPASS GRAFT EVALUATION   INDICATION:  Follow-up evaluation of bilateral lower extremity bypass  grafts.   HISTORY:  Diabetes:  Yes.  Cardiac:  Arrhythmia.  Hypertension:  Yes.  Smoking:  Half pack per day.  Previous Surgery:  Left fem-pop bypass graft in 2002, right fem-pop  bypass graft in 2003, both by Dr. Allean Found.  The patient complains of 3  months of left leg claudication.   SINGLE LEVEL ARTERIAL EXAM                               RIGHT              LEFT  Brachial:                    165                167  Anterior tibial:             150                106  Posterior tibial:            162                95  Peroneal:  Ankle/brachial index:        0.97               0.63   PREVIOUS ABI:  Date: 02/08/2008  RIGHT:  1.0  LEFT:  0.87   LOWER EXTREMITY BYPASS GRAFT DUPLEX EXAM:   DUPLEX:  1. On the right, Doppler arterial waveforms are biphasic proximal to,      within and monophasic distal to the right fem-pop bypass graft.  2. On the left, Doppler arterial waveforms are monophasic proximal to,      within and distal to the left fem-pop bypass graft with an elevated      peak systolic velocity in the distal native popliteal artery of 328      cm/s.    IMPRESSION:   1. On the right, the ABI is stable and imaging revealed the patent      right fem-pop bypass graft.  2. On the left, the ABI has decreased significantly since the previous      study and duplex imaging revealed a patent left fem-pop bypass      graft with elevated peak systolic velocities in the native      popliteal artery suggestive of arterial occlusive disease.  This      coupled with the patient's recent onset of left leg claudication      precipitated the patient scheduling a follow-up appointment with      her physician.   ___________________________________________  Judeth Cornfield. Scot Dock, M.D.   MC/MEDQ  D:  08/08/2008  T:  08/08/2008  Job:  [id number]

## 2011-02-11 NOTE — Procedures (Signed)
BYPASS GRAFT EVALUATION   INDICATION:  Bilateral lower extremity bypass graft.   HISTORY:  Diabetes:  Yes.  Cardiac:  Arrhythmia.  Hypertension:  Yes.  Smoking:  Yes.  Previous Surgery:  Left fem-pop bypass graft in 2002, right fem-pop  bypass graft in 2003, both grafts were placed by Dr. Allean Found.   SINGLE LEVEL ARTERIAL EXAM                               RIGHT              LEFT  Brachial:                    201                198  Anterior tibial:             163                187  Posterior tibial:            111                183  Peroneal:  Ankle/brachial index:        1.05               0.93   PREVIOUS ABI:  Date: 08/08/08  RIGHT:  0.97  LEFT:  0.63   LOWER EXTREMITY BYPASS GRAFT DUPLEX EXAM:   DUPLEX:  Monophasic Doppler waveforms noted throughout the left lower  extremity bypass graft in its native vessels with an increased velocity  of 386 cm/s noted in the distal native vessel.   IMPRESSION:  1. Patent left femoropopliteal bypass graft with an increased      velocity, as described above, and a mildly decreased left ankle      brachial index noted.  2. Normal right ankle brachial index noted.  3. No significant change noted in the bilateral ankle brachial indices      when compared to the previous studies.   ___________________________________________  Judeth Cornfield. Scot Dock, M.D.   CH/MEDQ  D:  02/19/2009  T:  02/19/2009  Job:  [id number]

## 2011-02-11 NOTE — Assessment & Plan Note (Signed)
OFFICE VISIT   [name] [name], [name] [name]  DOB:  01-09-1951                                       04/14/2007  [id number]   I saw the patient in the office today for continued followup of her  peripheral vascular disease.  She had a left fem above knee pop bypass  with a vein graft and a second toe amp by Dr. Allean Found in October 2002.  She  subsequently had a right fem below knee pop bypass with a vein graft in  May 2003.  I revised this graft with a Dacron patch angioplasty of the  distal anastomosis in August of 2005.  Most recently in January of this  year I took off a fifth toe and this has healed.  She comes in for a  routine visit.  She denies any claudication, rest pain, or non-healing  ulcers.  Her grafts were both scanned in May and both were patent with  no areas of significantly increased velocities except for below the  graft on the right side.   Today in the office she denies any chest pain, chest pressure,  palpitations, or arrhythmias.   EXAMINATION:  Blood pressure is 156/90, heart rate is 71.  She has  palpable femoral pulses.  She has a palpable popliteal and posterior  tibial pulse on the right, on the left side I can not palpate pedal  pulses however the left foot is warm and well-perfused and her toe  amputation site is healed.   Overall I am pleased with her progress and I will plan on seeing her  back in 1 year with followup ABIs.  She knows to call sooner if she has  problems.   Judeth Cornfield. Scot Dock, M.D.  Electronically Signed   CSD/MEDQ  D:  04/14/2007  T:  04/15/2007  Job:  [id number]

## 2011-02-11 NOTE — Procedures (Signed)
BYPASS GRAFT EVALUATION   INDICATION:  Followup bilateral bypass graft.   HISTORY:  Diabetes:  Yes, on insulin  Cardiac:  Arrhythmia  Hypertension:  Yes  Smoking:  One-half pack per day  Previous Surgery:  Left femoral to popliteal artery bypass graft with  reverse saphenous vein on 07/01/2001, right femoral to below-knee  popliteal artery bypass graft with translocated nonreversed saphenous  vein on 02/02/2002, both by Dr. Allean Found   SINGLE LEVEL ARTERIAL EXAM                               RIGHT              LEFT  Brachial:                    180                174  Anterior tibial:             60                 150  Posterior tibial:            172                142  Peroneal:  Ankle/brachial index:        0.95               0.83   PREVIOUS ABI:  Date: 02/09/2007  RIGHT:  0.94  LEFT:  0.82   LOWER EXTREMITY BYPASS GRAFT DUPLEX EXAM:   DUPLEX:  Doppler arterial waveforms are biphasic proximal to,  throughout, and distal to the graft on the right.  Waveforms on the left  are brisk and monophasic. Elevated velocities are detected in the right  native popliteal artery but stable from previous exam at 316 cm/second.   IMPRESSION:  1. Patent bilateral femoral to popliteal artery bypass grafts.  2. Elevated velocities in the native artery on the right distal to the      graft are stable from previous exam.  3. ABIs are stable bilaterally.   ___________________________________________  Judeth Cornfield. Scot Dock, M.D.   DP/MEDQ  D:  08/09/2007  T:  08/10/2007  Job:  [id number]

## 2011-02-11 NOTE — Procedures (Signed)
BYPASS GRAFT EVALUATION   INDICATION:  Followup, bilateral bypass grafts.   HISTORY:  Diabetes:  Yes.  Cardiac:  Arrhythmia.  Hypertension:  Yes.  Smoking:  A half-pack per day.  Previous Surgery:  Left fem-pop bypass graft in 2002; right fem-pop  bypass graft in 2003, both by Dr. Allean Found.   SINGLE LEVEL ARTERIAL EXAM                               RIGHT              LEFT  Brachial:                    214                200  Anterior tibial:             215                186  Posterior tibial:            194                197  Peroneal:  Ankle/brachial index:        1.0                0.87   PREVIOUS ABI:  Date: 08/09/07  RIGHT:  0.95  LEFT:  0.83   LOWER EXTREMITY BYPASS GRAFT DUPLEX EXAM:   DUPLEX:  Biphasic Doppler waveforms noted throughout the bilateral fem-  pop bypass grafts and their native vessels.  Elevated velocity of 358  cm/s seen at the left distal native artery that was not seen on 08/09/07  but was mentioned on the 02/09/07 exam.  The elevated velocity of the  right distal native vessel mentioned on the exam performed 08/09/07 was  not visualized.   IMPRESSION:  1. Patent bilateral femoropopliteal bypass grafts with an elevated      velocity noted at the left distal native artery level.  2. Ankle brachial indices appear stable bilaterally.   ___________________________________________  Judeth Cornfield. Scot Dock, M.D.   CH/MEDQ  D:  02/08/2008  T:  02/08/2008  Job:  [id number]

## 2011-02-11 NOTE — Assessment & Plan Note (Signed)
OFFICE VISIT   [name] [name], [name] [name]  DOB:  09/16/1951                                       09/05/2008  [id number]   I saw the patient in the office today for continued followup of her  bilateral fem-pop bypass graft.  Dr. Allean Found performed a left femoral to  above-knee pop bypass with vein graft and second toe amputation in 2002.  I subsequently did a right fem to below knee pop bypass with vein graft  in May 2003.  This graft was revised with a Dacron patch angioplasty of  the distal anastamosis in August 2005.  Most recently in January 2008 I  took off her fifth toe.  She comes in for a yearly followup visit.  Since I saw her last in July 2008, she denies any history of  claudication, rest pain, or nonhealing ulcers.   REVIEW OF SYSTEMS:  She has had no recent chest pain, chest pressure,  palpitations or arrhythmias.  She has had no bronchitis, asthma or  wheezing.   PHYSICAL EXAMINATION:  Blood pressure 164/79, heart rate is 64.  Neck is  supple.  I do not detect any carotid bruits.  Lungs are clear  bilaterally to auscultation.  Cardiac exam, she has a regular rate and  rhythm.  Abdomen:  Soft and nontender.  She has palpable femoral pulses  and a palpable right popliteal pulse.  I cannot palpate a left popliteal  pulse or pedal pulses on either side.   She had a Doppler studies on 08/08/2008 which showed an ABI of 97% on  the right and 63% on the left.  On the right Doppler wave forms were  biphasic proximal to and within the bypass graft with distal monophasic  signals.  On the left side, she had monophasic signals proximal to,  within, and distal to the bypass graft with an elevated peak systolic  velocity in the distal native artery below her bypass graft.   She does continue to smoke half a pack of cigarettes per week.  We have  again discussed the importance of tobacco cessation.   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  She has a vein bypass graft on the left which is  patent with no areas of concern within the graft.  We will continue to  follow her grafts on the graft protocol.  I will plan on seeing her back  in 1 month.  At this point both bypass grafts appear to be functioning  well.   [name] Cornfield. Scot Dock, M.D.  Electronically Signed   CSD/MEDQ  D:  09/05/2008  T:  09/06/2008  Job:  [id number]

## 2011-02-11 NOTE — Procedures (Signed)
BYPASS GRAFT EVALUATION   INDICATION:  Followup bilateral fem-pop bypass graft.   HISTORY:  Diabetes:  Yes.  Cardiac:  Arrhythmias.  Hypertension:  Yes.  Smoking:  Yes.  Previous Surgery:  Left fem-pop bypass graft in 2002, right fem-pop  bypass graft in 2003, both by Dr. Allean Found.   SINGLE LEVEL ARTERIAL EXAM                               RIGHT              LEFT  Brachial:                    168                173  Anterior tibial:             167                133  Posterior tibial:            184                119  Peroneal:  Ankle/brachial index:        1.06               0.77   PREVIOUS ABI:  Date:  08/16/2009  RIGHT:  0.86  LEFT:  0.63   LOWER EXTREMITY BYPASS GRAFT DUPLEX EXAM:   DUPLEX:  The right fem-pop bypass graft shows biphasic waveforms  proximal, within and distal to the graft.  The distal native artery  shows velocity of 163 cm/s.  The left fem-pop bypass graft has biphasic and monophasic waveforms  noted within the graft.  The distal native artery shows velocities of  649 cm/s.   IMPRESSION:  1. The right ankle brachial index is normal with biphasic waveforms,      and the left ankle brachial index has mild arterial disease.      Bilateral ankle brachial indices are slightly better than last      exam.  2. Patent bilateral fem-pop bypass graft with increased velocities as      noted above in the distal native arteries.      ___________________________________________  Judeth Cornfield. Scot Dock, M.D.   NT/MEDQ  D:  02/07/2010  T:  02/07/2010  Job:  [id number]

## 2011-02-12 NOTE — Assessment & Plan Note (Signed)
Lab Results  Component Value Date   CHOL 196 11/06/2010   CHOL 141 11/12/2009   CHOL  Value: 146        ATP III CLASSIFICATION:  <200     mg/dL   Desirable  200-239  mg/dL   Borderline High  >=240    mg/dL   High        10/30/2009   Lab Results  Component Value Date   HDL 60 11/06/2010   HDL 64 11/12/2009   HDL 50 10/30/2009   Lab Results  Component Value Date   LDLCALC 111* 11/06/2010   LDLCALC 63 11/12/2009   LDLCALC  Value: 69        Total Cholesterol/HDL:CHD Risk Coronary Heart Disease Risk Table                     Men   Women  1/2 Average Risk   3.4   3.3  Average Risk       5.0   4.4  2 X Average Risk   9.6   7.1  3 X Average Risk  23.4   11.0        Use the calculated Patient Ratio above and the CHD Risk Table to determine the patient's CHD Risk.        ATP III CLASSIFICATION (LDL):  <100     mg/dL   Optimal  100-129  mg/dL   Near or Above                    Optimal  130-159  mg/dL   Borderline  160-189  mg/dL   High  >190     mg/dL   Very High 10/30/2009   Lab Results  Component Value Date   TRIG 123 11/06/2010   TRIG 69 11/12/2009   TRIG 136 10/30/2009   Lab Results  Component Value Date   CHOLHDL 3.3 11/06/2010   CHOLHDL 2.2 Ratio 11/12/2009   CHOLHDL 2.9 10/30/2009   No results found for this basename: LDLDIRECT   Her last FLp was in February and was mildly above her goal.  She is currently on simvastatin 40 mg daily.  She is encouraged to work hard on diet and exercise to try to control this without switching to a higher potency statin like Lipitor or Crestor.

## 2011-02-12 NOTE — Assessment & Plan Note (Signed)
Lab Results  Component Value Date   HGBA1C 5.6 02/07/2011   HGBA1C 6.0 11/06/2010   HGBA1C 5.9 07/24/2010   Lab Results  Component Value Date   MICROALBUR 73.45* 03/18/2010   LDLCALC 111* 11/06/2010   CREATININE 1.90* 11/13/2010   Her A1C today is well controlled below her goal of <7.  We will continue her medications as they are prescribed for now.

## 2011-02-12 NOTE — Assessment & Plan Note (Signed)
BP Readings from Last 3 Encounters:  02/07/11 116/74  12/25/10 128/76  11/06/10 180/88   Blood pressure today is excellent.  We will continue her current medications and follow up at her next appointment.

## 2011-02-12 NOTE — Assessment & Plan Note (Signed)
She is still smoking at least 1/2 ppd.  She was encouraged to continue working on quitting.  We discussed several stragies to help her quit including setting places for smoking and delaying the first cigarette in the AM.

## 2011-02-12 NOTE — Assessment & Plan Note (Signed)
Her headaches are consistent with a tension headache due to the stiffness of her neck muscles.  We will manage conservatively for now with stretching exercises, ice, heat, and tylenol for her pain.  If the headaches continue to not get better flexeril or other muscle relaxant would be appropriate.

## 2011-02-14 NOTE — Procedures (Signed)
[place]. [facility]  Patient:    [name] [name], [name] [name] Visit Number: [id number] MRN: [id number]          Service Type: DSU Location: [facility] 2899 70 Attending Physician:  Mechele Collin Dictated by:   Mechele Collin, M.D. Proc. Date: 01/27/02 Admit Date:  01/27/2002 Discharge Date: 01/27/2002   CC:         CVTS Office   Procedure Report  PREOPERATIVE DIAGNOSIS: Right lower extremity ischemia.  PROCEDURE: Right lower extremity angiogram.  SURGEON: Mechele Collin, M.D.  ACCESS: Right common femoral artery (#5 French sheath).  TOTAL CONTRAST: 50 cc.  TOTAL FLUOROSCOPY TIME: 1.4 minutes.  COMPLICATIONS: None.  HISTORY OF PRESENT ILLNESS: This is a 60 year old black female with severe peripheral vascular disease and diabetes mellitus, who previously underwent a left lower extremity revascularization due to gangrenous toe which ultimately the toe was amputated and has healed. This bypass remains widely patent. She presented to the office complaining of rest pain in her right foot and based on her previous arteriogram done last fall, she had in-line flow to her foot, however, her ankle brachial indices have declined dramatically and she currently has ankle brachial indices of 0.3. Undoubtedly, there has been a change in her occlusive disease in her right lower extremity. Arteriogram was scheduled for possible revascularization.  DESCRIPTION OF PROCEDURE: The patient was brought to the cath lab and placed in the cath lab in supine position. Following adequate IV sedation, the groins were draped and prepped in sterile fashion. The right femoral head was localized in fluoroscopy and the overlying skin was anesthetized with 1% Lidocaine. A small stab incision was made at this level. A tract was developed down to the common femoral artery bluntly using hemostat. The right common femoral artery was then percutaneously punctured. A #5 French sheath was placed. Next,  through the #5 [place] sheath, a right lower extremity arteriogram was performed, first imaging the iliac to the bifurcation and serially down the right leg to the foot. After satisfactory images had been obtained, the procedure was  terminated and the #5 [place] sheath was removed and direct pressure held while hemostasis was achieved. The patient was then transferred to the recovery area in stable condition. The patient tolerated the procedure well. There were no complications.  INDIRECT FINDINGS: Normal common internal and external iliac system. There is no evidence of hemodynamic significant stenosis. The common femoral artery is widely patent, as are the profunda and SFA at the origins. The superficial femoral artery is widely patent proximally, however, in its mid portion there was a 50% stenosis followed by some diffuse irregularity to the level of the outer canal. The SFA is occluded at that canal with multiple collaterals filling around the knee. The popliteal artery reconstitutes just above the condyles of the knee. The patient distal portion of the popliteal artery is widely patent. The anterior tibial artery is occluded. However, reconstitutes by collaterals. The posterior tibia and popliteal arteries are widely patent to the foot.  RECOMMENDATIONS: 1. Routine post cath care. 2. Will discuss these findings with [name] and plan on a femoral    popliteal artery bypass next week and after resolve of her rest pain    symptoms. Dictated by:   Mechele Collin, M.D. Attending Physician:  Mechele Collin. DD:  01/27/02 TD:  01/29/02 Job: [id number]

## 2011-02-14 NOTE — Consult Note (Signed)
NAME:  [name] [name], [name] [name]                          ACCOUNT NO.:  [id number]   MEDICAL RECORD NO.:  [id number]                   PATIENT TYPE:  INP   LOCATION:  2908                                 FACILITY:  [facility]   PHYSICIAN:  Champ Mungo. Lovena Le, M.D.               DATE OF BIRTH:  1951-03-16   DATE OF CONSULTATION:  06/07/2004  DATE OF DISCHARGE:                                   CONSULTATION   REQUESTED BY:  Dr. Jacqulyn Ducking.   INDICATION FOR CONSULTATION:  Evaluation of apparent bradycardic arrest.   HISTORY OF PRESENT ILLNESS:  The patient is a 60 year old woman with  peripheral vascular disease and nonobstructive coronary disease, and a  history of preserved LV function by catheterization one year ago.  The  patient was in her usual state of health, having had peripheral vascular  surgery approximately three weeks ago.  She was sitting on her bed.  By her  sister's report, she became lethargic, fell back and had agonal breathing  and was unresponsive.  Then 911 was called, and CPR was initiated by the  sister.  On EMS arrival, the patient was pulseless and apneic, and was  intubated and had a temporary transcutaneous pacemaker placed along with  atropine and epinephrine being given.  She remained pulseless (? asystolic)  and was transferred to [facility] for additional evaluation.  Approximately 30 minutes into her code, the patient regained a pulse and  blood pressure.  She was initially quite bradycardic and required temporary  transvenous pacemaker insertion on the night of her admission.  Her chart  suggested the patient had high-grade heart block with no or very slow  ventricular escape mechanism, but there is no data of this at present.  The  patient has ruled out for myocardial infarction, and her VQ scan was not  suggestive of acute pulmonary embolism.  Repeat 2-D echocardiogram has been  done which demonstrates her EF is diminished in the setting of her cardiac  arrest with an ejection fraction thought to be between 35 and 40%.   PAST MEDICAL HISTORY:  As previously noted.  In addition, she has a history  of insulin-dependent diabetes as well as a history of inferior inguinal  arterial occlusive disease, and she is status post bilateral fem-pops.  She  is anemic.  She has chronic renal insufficiency.   SOCIAL HISTORY:  The patient quit smoking cigarettes approximately three  years ago and denies alcohol abuse.  She is adopted and does not have any  information about her family history.   REVIEW OF SYSTEMS:  Unable to be obtained secondary to the patient's  residual encephalopathy.   PHYSICAL EXAMINATION:  GENERAL:  She is a pleasant, somewhat confused middle-  aged woman, in no distress.  VITAL SIGNS:  Blood pressure 140/60, pulse 90 and regular.  Respirations 18.  Temperature 98.5.  HEENT:  Exam is normocephalic, atraumatic.  Pupils equal and round.  Oropharynx is moist.  Sclerae are anicteric.  NECK:  Revealed no jugular venous distension.  LUNGS:  Clear bilaterally to auscultation.  There were faint rales  bilaterally.  CARDIOVASCULAR:  Exam revealed a regular rate and rhythm with an S4 gallop  being present.  ABDOMEN:  Exam was obese, nontender, nondistended.  EXTREMITIES:  Warm bilaterally with no obvious lesions.  Her incisions were  healing nicely.   EKG demonstrated sinus rhythm with normal axis and first-degree AV block.   LABORATORY DATA:  Creatinine of 1.9 and hemoglobin of 10.  BNP was 476 on  admission.  Her TSH was normal.   IMPRESSION:  1.  Cardiopulmonary arrest in the setting of fairly recent peripheral      vascular surgery.  2.  Peripheral vascular disease.  3.  LV dysfunction by 2-D echocardiogram.  4.  Diabetes.  5.  Hypertension.   DISCUSSION:  The etiology of the patient's arrest is unclear.  It was  definitely sudden and without warning.  We do not have any strips, but  apparently she was quite bradycardic,  though it is unclear whether this  represents sinus bradycardia versus sinus rhythm with complete heart block  and no ventricular escape.  There was no overt significant conduction system  disease.  The patient did not have any evidence of an acute transmural  myocardial infarction by cardiac enzymes.  Her heart rhythm has now returned  to normal, and her mental status has markedly improved.  Initially following  her arrest, she was quite encephalopathic.  She is now alert, although she  is disoriented.   RECOMMENDATION:  The recommendation at this time would be to proceed with  left-heart catheterization and follow her clinically.  The results of her  catheterization will help [place] to decide on what additional therapy to  recommend for this patient as we have not had any clear evidence of  ventricular arrhythmias, I would be at least initially reluctant to  recommend a defibrillator for this patient.  She may well need permanent  pacing, however.                                               Champ Mungo. Lovena Le, M.D.    GWT/MEDQ  D:  06/07/2004  T:  06/08/2004  Job:  [id number]   cc:   Evette Doffing, M.D.  [place] [place]  [place] [address]  Fax: [phone]

## 2011-02-14 NOTE — Consult Note (Signed)
NAME:  [name] [name], [name] [name]                          ACCOUNT NO.:  [id number]   MEDICAL RECORD NO.:  [id number]                   PATIENT TYPE:  INP   LOCATION:  2929                                 FACILITY:  [facility]   PHYSICIAN:  Daniel Nones, M.D. [facility]      DATE OF BIRTH:  Oct 04, 1950   DATE OF CONSULTATION:  06/03/2004  DATE OF DISCHARGE:                                   CONSULTATION   REASON FOR CONSULTATION:  Temporary pacemaker insertion.   HISTORY OF PRESENT ILLNESS:  Ms. Pfau is a 60 year old woman with  diabetes and peripheral vascular disease who recently underwent peripheral  vascular surgery.  According to the patient's family, she was doing fairly  well until earlier the evening of admission when she became unresponsive  with eyes rolling back.  She was found to be pulseless and apneic.  Within  1-2 minutes, CPR was initiated and EMS was called.  She reportedly had  pulseless electrical activity/brady arrhythmia which responded to  epinephrine and transcutaneous pacing.  In the emergency department, she was  having intermittent runs of bradycardia into the 30s and 40s but would  respond to epinephrine with a good blood pressure recovery.   Dictation ends at this point.                                               Daniel Nones, M.D. [facility]    RPK/MEDQ  D:  06/03/2004  T:  06/03/2004  Job:  [id number]

## 2011-02-14 NOTE — Op Note (Signed)
[facility]  Patient:    [name] [name], [name] [name]                       MRN: [id number] Proc. Date: 02/25/01 Adm. Date:  [id number] Attending:  Lucia Gaskins CC:         [facility]   Operative Report  PREOPERATIVE DIAGNOSIS:       Uterine fibroids.  POSTOPERATIVE DIAGNOSES:      1. Uterine fibroids.                               2. Adhesions consistent with prior history of                                  pelvic inflammatory disease.  PROCEDURE:                    Total abdominal hysterectomy and bilateral salpingo-oophorectomy with lysis of adhesions.  SURGEON:                      Agnes Lawrence, M.D.  ASSISTANT:                    Irene Pap, M.D.  ANESTHESIA:                   General endotracheal.  COMPLICATIONS:                None.  ESTIMATED BLOOD LOSS:         400 cc.  FLUIDS:                       As per anesthesia.  URINE OUTPUT:                 200 cc clear urine.  OPERATIVE FINDINGS:           The uterus was distorted by multiple subserous myomas, approximately 16 cm in sagittal diameter.  There were filmy adhesions involving the adnexa bilaterally.  On the left, there was a hydrosalpinx noted, as well.  All specimens were sent to pathology.  DESCRIPTION OF PROCEDURE:     The risks, benefits, indications and alternatives of the procedure were reviewed with the patient and informed consent was obtained.  The patient was taken to the operating room with an IV running.  The patient was placed in the supine position, given general anesthesia, prepped and draped in the usual sterile fashion.  A Pfannenstiel incision was then made approximately 2 cm above the symphysis pubis and extended to the rectus fascia.  The fascia was then incised bilaterally with curved Mayo scissors and the muscles of the anterior abdominal wall were separated in the midline.  The parietal peritoneum was grasped  between two pickups, elevated and entered sharply.  The pelvis was examined with the findings noted above.  A Balfour retractor was placed into the incision and the bowel packed away with moistened laparotomy sponges.  The round ligaments on both sides were clamped, transected and suture ligated with #1 chromic. The anterior leaf of the broad ligament was incised along the bladder reflection to the midline on both sides.  The bladder was dissected off the lower uterine segment and cervix.  The utero-ovarian ligaments  and fallopian tubes on both sides were then clamped, transected and suture ligated with #1 chromic.  Excellent hemostasis was visualized. the uterine arteries were skeletonized on the left, clamped with a Heaney clamp, transected, and suture ligated with a #1 chromic.  On the patients right, there was an interligamentous myoma right above where the uterine vessels would be ligated. This myoma was enucleated after the uterine was clamped with a Heaney clamp. The uterine artery on the right was then transected and suture ligated with #1 chromic.  At this point, the uterus was amputated at the level of the uterines.  The infundibulopelvic ligaments on both sides were then doubly clamped, transected and suture ligated with #1 chromic.  The uterosacral ligaments were clamped on both sides, transected and suture ligated in a similar fashion.  The cervix was then amputated.  The vaginal cuff angles were closed with figure-of-eight sutures of #1 chromic.  The remainder of the cuff was closed with a series of interrupted #1 chromic figure-of-eight sutures. Hemostasis was assured.  The pelvis was irrigated copiously with warm normal saline.  All laparotomy sponges and instruments were removed from the abdomen. The fascia was reapproximated with 0 PDS.  The skin was closed with staples. Sponge, lap, needle and instrument counts were correct x 2.  The patient was taken to the PACU in  stable condition. DD:  02/25/01 TD:  02/25/01 Job: [id number]

## 2011-02-14 NOTE — Discharge Summary (Signed)
[place]. [facility]  Patient:    [name] [name], [name] [name] Visit Number: [id number] MRN: [id number]          Service Type: SUR Location: 2000 2018 01 Attending Physician:  Mechele Collin Dictated by:   Lestine Box, RNFA Admit Date:  02/02/2002 Discharge Date: 02/08/2002   CC:         Posey Boyer, M.D.  Richard C. Tuchman, D.P.M.  Newt Minion, M.D.   Discharge Summary  DATE OF BIRTH: 1951-09-17.  ADMISSION DIAGNOSIS: Ischemic right foot.  SIGNIFICANT PAST MEDICAL HISTORY 1. Infrainguinal arterial occlusive disease, status post left femoral    popliteal bypass and second toe amputation in October 2002. 2. Diabetes mellitus type 1. 3. Hypertension. 4. Hypercholesterolemia. 5. History of tobacco and alcohol abuse. 6. Diabetic nephropathy. 7. Recent metatarsal fracture, followed by Dr. Sharol Given. 8. Chronic obstructive pulmonary disease.  ALLERGIES: CODEINE, DARVOCET, VICODIN.  DISCHARGE DIAGNOSIS: Right lower extremity ischemia, status post left femoral to below-the-knee popliteal bypass.  BRIEF HISTORY: The patient is a 60 year old African-American female with the above history. She recently sought intervention for her right foot pain and lifestyle limiting claudication of the right lower extremity.  HOSPITAL COURSE: On Jan 27, 2002, she was admitted to [facility] under the care of Dr. Benson Setting for an aortogram with bilateral lower extremity runoffs. After his examination Dr. Allean Found recommended right lower extremity revascularization.  On Feb 01, 2002, the patient underwent the following surgical procedures: Right femoral to below-the-knee popliteal bypass with a non reversed greater saphenous vein graft and intraoperative arteriogram. She tolerated this procedure well and was transferred in stable condition to the PACU.  Since surgery she has remained hemodynamically stable. Her postoperative course has been notable for an  unresponsive episode on postoperative day #3; this was Feb 05, 2002. She was found in her room by the nursing staff unresponsive. Her CBG at that time was 82, blood pressure was 70 to 80 systolic. Her oxygenation was 82%. Dr. Kellie Simmering was notified. He immediately came to evaluate the patient. She regained consciousness over the next few minutes slowly. A neurology consultation was obtained as well as a CT scan of the head to rule out CVA versus seizure. She was transferred to the SICU. Her glucose was rechecked and it was 73. She was treated with IV D5W solution. A CT scan of the head was negative for any acute changes, and her neurologic status returned to baseline without any further episodes.  On Feb 07, 2002, carotid Doppler studies were performed and revealed no significant carotid artery disease. The remainder of her hospital course was uneventful. On the morning of Feb 08, 2002, the patient was feeling well, her vital signs were stable and she was afebrile. Her right leg incisions were healing. Her bypass graft is patent. She is tolerating her diet. She is ambulating well and her pain is well controlled. She was ready for discharged this morning, Feb 08, 2002.  CONDITION ON DISCHARGE: Improved.  DISCHARGE INSTRUCTIONS: Activity, she was instructed to continue walking with her walker. Diabetic diet. Wound care, she may shower at home. She has also been instructed to examine her incisions each day if they become red, hot, swollen, draining or if she has a fever over 101 degrees she is to call Dr. Renard Hamper office.  DISCHARGE MEDICATIONS: She has been instructed to resume her home medications: 1. Ultram 1 to 2 p.o. q.4-6h. p.r.n. pain. 2. Amitriptyline 150 mg p.o. q.h.s. 3. Insulin  70/30, 40 units q.a.m. and 50 units q.p.m. 4. Lotensin 20 mg p.o. q.d. 5. Lipitor 10 mg p.o. q.d. 6. Allegra p.r.n.  FOLLOW UP: She has an appointment to see Dr. Allean Found at the [place] office on Feb 18, 2002,  at 11:45 in the morning. Dictated by:   Lestine Box, RNFA Attending Physician:  Mechele Collin. DD:  02/22/02 TD:  02/23/02 Job: [id number]

## 2011-02-14 NOTE — Consult Note (Signed)
NAME:  [name] [name], [name] [name]                          ACCOUNT NO.:  [id number]   MEDICAL RECORD NO.:  [id number]                   PATIENT TYPE:  INP   LOCATION:  2908                                 FACILITY:  [facility]   PHYSICIAN:  Flint Melter. Jacolyn Reedy, M.D.          DATE OF BIRTH:  Dec 08, 1950   DATE OF CONSULTATION:  06/08/2004  DATE OF DISCHARGE:                                   CONSULTATION   REASON FOR CONSULTATION:  Document neurological status.   HISTORY OF PRESENT ILLNESS:  [name] [name] is a 60 year old black woman who  was admitted on June 03, 2004 with an out-of-hospital cardiac arrest.  It is unclear to me the duration of her time down.  Eventually, her heart  beat was restored.  She was intubated and on the ventilator until yesterday.  She regained consciousness but has had altered mental status since that  time.  She is supposed to have a repeat catheterization and possibly  implantation of defibrillator.  However, cardiology requests a neurological  evaluation prior to that procedure.  According to a friend who lives with  her, at baseline she is independent in activities of daily living.  Able to  drive, cook, care for herself, dress, bathe, etc.  She is disabled and she  is somewhat forgetful.   REVIEW OF SYSTEMS:  Unobtainable from the patient.   PAST MEDICAL HISTORY:  Significant for hypertension, obesity, insulin-  dependent diabetes, diabetic neuropathy, peptic ulcer disease, and  peripheral arterial disease.   MEDICATIONS:  Lovenox, Ventolin, Atrovent, Lantus, NovoLog, captopril,  Prevacid, Jevity, Ativan and Haldol.   ALLERGIES:  CODEINE, DARVOCET, and VICODIN.   SOCIAL HISTORY:  She is disabled.  She is a former smoker.   FAMILY HISTORY:  Noncontributory.   PHYSICAL EXAMINATION:  VITAL SIGNS:  Temperature is 98.7, pulse 105, BP  175/65, respirations 20.  HEENT:  Head is normocephalic, atraumatic.  NEUROLOGICAL:  She is awake.  She is mildly lethargic.  She  will regard  examiner but only inconsistently follow commands.  She mumbles quietly and  rapidly and about 90% of it is incoherent to me. She will not tell me her  name.  She will not name objects or repeat sentences.  I cannot further  assess mental status as she will not cooperate with orientation, language,  memory recall, or concentration path.  Cranial Nerves:  Pupils are equal and  reactive to light.  Extraocular movements are full.  She blinks to threat.  There is no obvious facial asymmetry.  She has a mild dysarthria.  Motor  exam:  She is requiring her strength and apparently moving all four  extremities spontaneously and equally according to the nurse.  At present,  she will just squeeze my hand bilaterally on command but takes repeated  commands for her to do that.  Deep tendon reflexes are absent.  She has  downgoing toes.  Coordination:  She is  not cooperative.  Sensory exam:  There is decreased sensation distally in the lower extremities probably due  to the peripheral neuropathy.  Otherwise, she will acknowledge pain by  saying ouch when pinched bilaterally upper and lower extremities.  She has  been intermittently agitated according to the nurse and received Haldol just  before my exam.   MRI scan of the brain done on June 07, 2004 was severely impaired with  motion artifact; however, was essentially normal.  No evidence of acute  abnormality on diffusion or even chronic strokes on the final diagnosis.  Labs are unremarkable.   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  She is now 58 days out from the out-of-hospital cardiac arrest.  It is hard to predict  what rate or degree of cognitive recovery to expect at this stage.  Do need  to rule out seizure activity ongoing.   RECOMMENDATIONS:  EEG and I recommend  NPO until speech therapy can evaluate  for swallowing safety.                                               Catherine A. Jacolyn Reedy, M.D.    CAW/MEDQ  D:  06/08/2004  T:  06/09/2004  Job:  [id number]

## 2011-02-14 NOTE — Op Note (Signed)
[place]  Patient:    [name] [name], [name] [name] Visit Number: [id number] MRN: [id number]          Service Type: SUR Location: 2000 2025 01 Attending Physician:  Mechele Collin Dictated by:   Mechele Collin, M.D. Proc. Date: 02/02/02 Admit Date:  02/02/2002                             Operative Report  PREOPERATIVE DIAGNOSIS:  Right lower extremity ischemia with rest pain.  POSTOPERATIVE DIAGNOSIS:  Right lower extremity ischemia with rest pain.  OPERATION PERFORMED:  Right femoral below knee popliteal artery bypass with nonreversed translocated greater saphenous vein.  SURGEON:  Mechele Collin, M.D.  ASSISTANT:  Rosealee Albee. Theda Sers, P.A.  ANESTHESIA:  Local MAC.  ESTIMATED BLOOD LOSS:  150 cc.  DRAINS:  None.  SPECIMENS:  None.  COMPLICATIONS:  None.  INDICATIONS FOR PROCEDURE:  The patient is a 60 year old black female with diabetes mellitus and severe peripheral vascular disease who has previously undergone left lower extremity revascularization approximately six months ago. Since that time she had gone on to develop rest pain in her right foot.  I obtained arteriogram that demonstrated a superficial femoral artery occlusion that extended down to the level of the knee.  She was felt to be a candidate for revascularization for fem-below knee artery popliteal bypass grafting and she was found to have an excellent quality and caliber greater saphenous vein on the anterolateral leg.  DESCRIPTION OF PROCEDURE:  The patient was brought to the operating room and placed on the operating table in supine position.  Following adequate general endotracheal anesthesia, the right lower extremity was prepped and draped circumferentially from the groin to the foot.  A vertical incision was made to the right groin and carried through the underlying soft tissues using the Bovie to control bleeding.  The common femoral artery was identified at this level,  mobilized and encircled with a vessel loop proximally.  The profunda and superficial femoral artery were also mobilized individually and encircled with vessel loops.  Next, the saphenofemoral junction was identified medially in the incision and the greater saphenous vein was mobilized through this incision.  Next, an oblique incision was made on the medial thigh leaving the skin bridge to expose the greater saphenous vein.  This was repeated down the medial aspect of the thigh exposing the greater saphenous vein leaving intermittent skin bridges.  Ultimately the greater saphenous vein was exposed down to the midcalf.  All side branches were then ligated and divided.  The vein was left in situ proximally and distally.  The below-knee popliteal space was then entered and below-knee popliteal artery was identified, mobilized and encircled with vessel loops proximally and distally.  The vessel was of good caliber and quality.  There was no significant medial calcinosis.  Next the vein was harvested distally and the stump oversewn with 2-0 silk tie. Proximally, a Cooley clamp was placed across the saphenofemoral junction and the greater saphenous vein was transected at this level.  The resultant defect in the common femoral vein was oversewn with a running 5-0 Prolene suture. The vein was then instilled with heparin saline solution and all leaking side branches were oversewn with 7-0 Prolene sutures.  The proximal portion of the vein was then everted and the first valve was lysed.  The vein was then placed on the back table in heparin saline solution.  Next the long [place] tunneler was then passed from the inferior incision posterior to the knee in between the heads of the gastrocnemius and then brought up into the groin wound.  The patient was then systemically heparinized.  Following an adequate three-minute circulation time the loops around the SFA, profunda and common femoral artery were  tightened to occlude flow.  The longitudinal arteriotomy was performed. The vein graft was then transposed and sewn into position in end-to-side fashion with running 6-0 Prolene suture. Following completion of anastomosis, the clamps were released, restoring flow to the leg.  There was no bleeding from the anastomotic suture line.  The valves and the vein graft were then lysed using a retrograde [place] valvulotome.  The flow in the graft was excellent following the lysis of all the valves.  The graft was then extended its maximum length and marked on its anterior surface with a marking pen.  The vein graft was then passed in the [place] tunneler and brought out in the inferior wound.  The flow of the graft was found to be excellent following tunneling of the graft.  Next, the loops around the popliteal artery were tightened to occlude flow.  The longitudinal arteriotomy was performed.  The vein graft was trimmed to the appropriate length, spatulated and sewn into position into end-to-side fashion with running 6-0 Prolene suture.  Prior to completion of the anastomosis the native vessels were back-bled and the graft was flushed.  The anastomosis was then completed and the flow was restored. There was found to be excellent biphasic Doppler flow distal to the anastomosis and in the graft itself.  Similarly there was biphasic flow in the posterior tibial artery that was graft dependent.  At completion, arteriogram was performed and there was no evidence of distal anastomotic stenosis and run-off to the foot was through primarily the peroneal and posterior tibial arteries.  At this point 30 mg of protamine was administered, hemostasis was achieved in all wounds.  The wounds were then irrigated with warm sterile saline and closed in layers of 2 and 3-0 Vicryl suture followed by skin staples.  Sterile dry dressings were applied.  The patient was then awakened from anesthesia and transferred to the  recovery room in stable condition.  The patient tolerated the procedure well.  There were no complications.  All sponge and needle counts were reported as correct.  Dictated by:   Mechele Collin, M.D. Attending Physician:  Mechele Collin. DD:  02/02/02 TD:  02/03/02 Job: [id number]

## 2011-02-14 NOTE — Consult Note (Signed)
[facility]  Patient:    [name] [name] Visit Number: [id number] MRN: [id number]          Service Type: SUR Location: 2000 2018 01 Attending Physician:  Mechele Collin Dictated by:   Jill Alexanders, M.D. Proc. Date: 02/05/02 Admit Date:  02/02/2002   CC:         Nelda Severe. Kellie Simmering, M.D.  Mechele Collin, M.D.  [facility]; [address]  Posey Boyer, M.D.   Consultation Report  HISTORY OF PRESENT ILLNESS:  [name] [name] is a 60 year old black female born Apr 19, 1951 with a history of diabetes, hypertension, obesity, peripheral vascular disease.  This patient has been admitted on Feb 02, 2002 for ischemia to the right foot and has undergone a right fempop bypass procedure.  This patient has done well initially following the surgery, but today was found in an unresponsive state.  Patients blood pressures were low at 0000000 systolic and patient was "foaming at the mouth" with saturations in the 82 range that rapidly came up to 92%.  This patient has been noted to have CBG initially of 80.  Patient recalls nothing of the event.  Last remembers getting ready to eat.  Patient has dentures but the dentures are at home.  Has no teeth.  No apparent tongue biting was noted.  Patient has been sent for a CT of the head and this appears to be unremarkable.  Patient denies any focal numbness, weakness at this point.  Denies headache.  Is back to her usual baseline mental status.  PAST MEDICAL HISTORY:  1. Altered mental status, unresponsive state, hypotensive respiratory     distress, poorly responsive state.  2. Diabetes.  3. Hypertension.  4. Hypercholesterolemia.  5. History of peripheral vascular disease with right fempop bypass this a.m.  6. History of alcohol abuse in the past.  7. History of chronic renal insufficiency.  8. History of GI bleed in the past.  9. History of fibroid tumor resection status post  hysterectomy May 2002. 10. History of tonsillectomy. 11. History of left fempop bypass in the past with left second toe amputation.     Patient does claim to have had a blackout episode that apparently was     associated with hypotension in May 2002.  MEDICATIONS:  1. Sliding scale insulin.  2. Colace 200 mg daily.  3. Amitriptyline 150 mg daily.  4. Patient has a significant diabetic peripheral neuropathy.  5. Neurontin 800 mg daily.  6. Lotensin 20 mg q.d.  7. Zocor 20 mg q.d.  8. Claritin 10 mg daily.  9. Lasix 20 mg daily. 10. Aspirin 81 mg q.d. 11. Morphine if needed. 12. Tylox if needed. 13. Ultram if needed.  ALLERGIES:  CODEINE, DARVOCET, VICODIN.  SOCIAL HISTORY:  Does not currently drink.  Quit drinking four years ago. Smokes half a pack of cigarettes a day.  Claims she quit two months ago.  This patient is currently not working, on disability.  Has one son who is alive and well.  FAMILY HISTORY:  Mother died with heart disease and stroke and diabetes. Father had stroke and heart disease.  Two brothers are in relatively good health.  Two sisters.  One died of brain tumor.  One has asthma.  REVIEW OF SYSTEMS:  Notable for no recent fevers, chills.  Patient denies headache, visual field changes.  Denies shortness of breath, chest pains, nausea, vomiting, problems controlling the bowels or bladder.  Patient denies any focal numbness or weakness on the face, arms, or legs.  Patient feels at baseline at this point.  PHYSICAL EXAMINATION  VITAL SIGNS:  Blood pressure currently have come up to about 100/64, heart rate 95, temperature afebrile.  GENERAL:  This patient is a moderately to markedly obese black female who is alert and cooperative at the time of examination.  HEENT:  Head is atraumatic.  Eyes:  Pupils are equal, round, and reactive to light.  Disks are flat bilaterally.  NECK:  Supple.  No carotid bruits noted.  RESPIRATORY:  Clear.  CARDIOVASCULAR:   Regular rate and rhythm without obvious murmurs or rubs noted.  EXTREMITIES:  Post surgical right bypass in the leg.  No significant edema on the left.  NEUROLOGIC:  Cranial nerves as above.  Facial symmetry is present.  Patient notes good sensation to face to pin prick and soft touch bilaterally.  Patient has good strength to facial muscles and the muscles to head turn and shoulder shrug bilaterally.  Visual fields are full.  No aphasia is noted.  Motor testing in the upper extremities is full and normal.  Difficult to test the lower extremities.  Patient is able to lift both legs up off the bed well. Has good dorsiflexion of the feet bilaterally.  Patient has a decreased pin prick sensation to just at the knees bilaterally.  Vibratory sensation is present to the knees bilaterally.  Pin prick, soft touch, vibratory sensation of the upper extremities symmetric and normal.  Patient has fair finger-nose-finger.  Is able to perform toe-to-finger.  Was not ambulated. Deep tendon reflexes depressed throughout.  Toes are neutral bilaterally.  LABORATORIES:  White count 12.1, hemoglobin 11.1, hematocrit 32.7, platelets 277.  Sodium 139, potassium 3.7, chloride 103, CO2 27, glucose 75, BUN 33, creatinine 2.1, calcium 8.8.  IMPRESSION: 1. Episode of transient altered mental status, rule out seizure type event. 2. Diabetes. 3. Diabetic peripheral neuropathy. 4. Hypertension. 5. Peripheral vascular disease status post right femoral popliteal bypass this    admission.  This patient has had a transient episode of altered mental status and currently is at baseline.  Patient apparently had a blackout about a year ago that she is unable to give any detailed history concerning.  I suspect it is  quite possible that patient may have suffered a seizure event.  Could possibly have been related to hypoglycemic event that was not documented.  Possibility of cardiac ischemia and pulmonary embolus is  present, but less likely.  Doubt TIA event, but again, this is a possibility.  PLAN: 1. EEG study. 2. MRI of the brain, possibly with an MRI angiogram when staples come out of    the leg. 3. Carotid Doppler study. 4. No anticonvulsants for now.  Will follow patient conservatively.  Patient    is on Neurontin for peripheral neuropathy.  Will follow patients clinical    status while in-house. Dictated by:   Jill Alexanders, M.D. Attending Physician:  Mechele Collin. DD:  02/05/02 TD:  02/07/02 Job: [id number]

## 2011-02-14 NOTE — Op Note (Signed)
NAME:  [name] [name], [name] [name]                          ACCOUNT NO.:  [id number]   MEDICAL RECORD NO.:  [id number]                   PATIENT TYPE:  INP   LOCATION:  4712                                 FACILITY:  [facility]   PHYSICIAN:  Judeth Cornfield. Scot Dock, M.D.        DATE OF BIRTH:  [birth year]   DATE OF PROCEDURE:  05/21/2004  DATE OF DISCHARGE:                                 OPERATIVE REPORT   PREOPERATIVE DIAGNOSIS:  Stenosis of distal left femoral-popliteal bypass  graft.   POSTOPERATIVE DIAGNOSIS:  Stenosis of distal left femoral-popliteal bypass  graft.   OPERATION PERFORMED:  1. Dacron patch angioplasty of distal stenosis of left femoral-popliteal     bypass graft.  2. Exploration of the left lesser saphenous vein.   SURGEON:  Judeth Cornfield. Scot Dock, M.D.   ASSISTANT:  John Giovanni, P.A.-C.   ANESTHESIA:  General.   DESCRIPTION OF PROCEDURE:  The patient was taken to the operating room and  the left lower extremity was prepped and draped in the usual sterile  fashion.  I had looked at the lesser saphenous vein with the duplex and  marked this area.  It was very small.  I elected to explore this area,  however.  A longitudinal incision was made over the lesser saphenous vein in  the proximal leg and here the vein was identified and was quite small.  I  did not think this would be usable as even a vein patch.  Therefore,  hemostasis was obtained in this wound and this wound and this would was  closed with a deep layer of 3-0 Vicryl and the skin closed with 4-0 Vicryl.  Next, attention was turned to exploration of the stenosis which was right at  the distal aspect of the distal anastomosis of her left femoral-popliteal  bypass graft.  A longitudinal incision was made between the sartorius and  vastus medialis.  Dissection was carried down to the fascia, which was  divided, and then the vein femoral-popliteal bypass graft was identified,  dissected free, controlled with a vessel  loop. It was dissected free down to  the above-knee popliteal artery which was extended well past the anastomosis  onto the popliteal artery.  Of note there was some diffuse disease within  this.  The stenosis was just past the hood of the anastomosis.  This was  identified with the Doppler.  Tourniquet was placed on the thigh and the  patient was heparinized.  The leg was exsanguinated with an Esmarch bandage.  The tourniquet inflated to 250 mmHg.  Under tourniquet control, a  longitudinal graftotomy was made through the hood of the graft extending  onto the popliteal artery, past the stenosis.  Again there was moderate  diffuse disease throughout.  A Dacron patch was used as there was no vein  patch available.  This was sewn using a continuous 6-0 Prolene suture.  Prior to completing the anastomosis, the artery was  back-bled and flushed  appropriately.  The anastomosis was completed.  Flow was re-established to  the left foot and there was a good Doppler flow in the dorsalis pedis,  posterior tibial and peroneal positions.  Hemostasis was obtained in the  wound.  The wound was closed with a deep layer of 2-0 Vicryl, the  subcutaneous layer with 3-0 Vicryl and the skin closed with a 4-0  subcuticular stitch.  Sterile dressing was applied and the patient tolerated  the procedure well and was transferred to the recovery room in satisfactory  condition.  All sponge and needle counts were correct.                                              Judeth Cornfield. Scot Dock, M.D.   CSD/MEDQ  D:  05/21/2004  T:  05/22/2004  Job:  [id number]

## 2011-02-14 NOTE — Discharge Summary (Signed)
[name] [name], ANCELET NO.:  [id number]   MEDICAL RECORD NO.:  [id number]          PATIENT TYPE:  INP   LOCATION:  6705                         FACILITY:  [facility]   PHYSICIAN:  Judeth Cornfield. Scot Dock, M.D.DATE OF BIRTH:  Feb 13, 1951   DATE OF ADMISSION:  10/29/2006  DATE OF DISCHARGE:  10/31/2006                               DISCHARGE SUMMARY   ADMISSION DIAGNOSES:  Osteomyelitis of the left 5th toe.   DISCHARGE/SECONDARY DIAGNOSES:  1. 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  2. Diabetes mellitus type 1.  3. Hypertension.  4. Hypercholesterolemia.  5. Diabetic neuropathy.  She has chronic renal insufficiency.  6. Chronic obstructive pulmonary disease.  7. Peripheral vascular disease.  8. History of a bilateral femoral popliteal bypass grafting by Dr.      Allean Found.  (In October 2002, she had left femoral above knee popliteal      bypass with vein graft, and left 2nd toe amputation; in May of 2003      had a right femoral to below knee popliteal bypass with vein graft,      status post revision in August of 2005 with a Dacron patch      angioplasty of the distal stenosis of the left femoral popliteal      graft.)  9. Ongoing tobacco use (but one-half pack per day for 38 years).  10.Allergy to CODEINE and VICODIN which both cause a rash; and      questionable reaction to MORPHINE in the past, but has taken this      hospitalization without any difficulty.  11.History of hysterectomy.  12.History of tonsillectomy.  13.History of gastrointestinal bleed.  14.History of cardiac arrest in September 2005, felt secondary to      metabolic cause.  15.Cardiomyopathy with left ventricular ejection fraction of 35-40% in      2005.  16.History of altered mental status secondary to hypoxic      encephalopathy in September of 2005.  17.History of bronchitis.  18.Mild postoperative acute blood loss anemia and hypokalemia,  supplemented.   PROCEDURES PERFORMED:  On October 29, 2006, right amputation of the left  5th toe by Dr. [name].   CONSULTATIONS:  Physical therapy.   HISTORY OF PRESENT ILLNESS:  Ms. Bibian is a 60 year old African  American female who developed a wound on the lateral aspect of her left  toe back in October of 2005.  This has been treated aggressively as an  outpatient by Dr. Amalia Hailey.  However, recent x-ray suggested  osteomyelitis of the left 5th toe.  For this reason it was felt she  would likely require amputation of the toe.  She is status post  bilateral femoral popliteal bypass grafting in the past, as well as a  left 2nd toe amputation.  She was referred to Dr. [name]  who did ultimately recommend left 5th toe amputation.   HOSPITAL COURSE:  Ms. Ermel was admitted to [facility] on October 29, 2006 and underwent left 5th toe amputation.  Postoperatively  she was transferred to a general medical/surgical floor  where it was anticipated she would remain until discharge.  At the time  of this dictation, she has remained hemodynamically stable.  She has  currently been needing morphine for pain, but has just been started on  oral Ultram.  She has been seen by physical therapy and did fairly with  ambulating with her left foot, partial weightbearing, using a Darco  shoe.  Home health physical therapy for safety evaluation was  recommended and has been ordered.   Vital signs have remained stable, most recently temperature 98.3, heart  rate in the 50s with baseline EKG showing normal sinus rhythm at 64,  blood pressure has been 135/70, oxygen saturation 97% on room air.  She  has been restarted on her home diabetes regimen, and sugar has been  stable, ranging from 92 to 157 over the last 24 hours.  Her  postoperative labs are stable, showing a sodium of 141, potassium 3.2  which was supplemented, chloride 108, bicarbonate 27, blood  glucose 90,  BUN 12, creatinine 1.33.  CBC:  8.8, hemoglobin 9.1, hematocrit 26.2,  platelet count 177,000.  Preoperative labs show her liver function tests  to be normal, with a total bilirubin of 1.1, alkaline phosphatase 106,  AST 21, ALT of 12, her total protein was 8.1, and blood albumin 3.4.  Hemoglobin A1C was 6.6.  Postoperatively she has been tolerating a  regular diet and her left 5th toe amputation site has been stable.  She  has been undergoing daily dressing changes with bacitracin, 4 x 4 gauze,  Kerlix and ACE wrap.   If Ms. Soung continues to make progress in her mobility and her pain  is controlled on oral medication, it is anticipated she will be ready  for discharge home on postoperative day #2 or day #3, February 2 or  November 01, 2006.   MEDICATIONS ON DISCHARGE:  Ultram 50 mg one tablet p.o. every 4 hours as  needed for pain; metoprolol 50 mg p.o. daily; lisinopril 20 mg p.o.  daily; ibuprofen 400 mg p.o. daily; Augmentin 125 mg p.o. daily;  hydralazine 50 mg p.o. daily; Levaquin 500 mg p.o. daily; aspirin 81 mg  p.o. daily; isosorbide mononitrate 30 mg p.o. daily; gabapentin 300 mg  p.o. 3 times per day; simvastatin 20 mg p.o. daily; Lantus insulin 20  units subcutaneously at night; and NovoLog insulin 10 units  subcutaneously in the morning.   DISCHARGE INSTRUCTIONS:  She is to continue the diabetic appropriate  diet.  She is to place bacitracin on her incision site daily, and then  cover her foot with dry gauze and ACE wrap.  Her incision may be cleaned  gently with soap and water.  She should increase her activities slowly  and she can continue partial weightbearing using heel pressure only with  the use of a Darco shoe.  Home health physical therapy with be arranged  for safety evaluation prior to discharge.  She will follow up with Dr.  Ricard Dillon in the [place] office in approximately two weeks, and the office will contact her regarding a specific appointment  date and time.      Jacinta Shoe, P.A.      Judeth Cornfield. Scot Dock, M.D.  Electronically Signed    AWZ/MEDQ  D:  10/30/2006  T:  10/31/2006  Job:  [id number]   cc:   Judeth Cornfield. Scot Dock, M.D.  Richard C. Tuchman, D.P.M.

## 2011-02-14 NOTE — Cardiovascular Report (Signed)
NAME:  [name] [name], [name] [name] NO.:  [id number]   MEDICAL RECORD NO.:  [id number]                   PATIENT TYPE:  OIB   LOCATION:                                       FACILITY:  [facility]   PHYSICIAN:  Junious Silk, M.D. [facility]         DATE OF BIRTH:  July 10, 1951   DATE OF PROCEDURE:  05/05/2003  DATE OF DISCHARGE:                              CARDIAC CATHETERIZATION   PROCEDURE PERFORMED:  Right and left heart catheterization with coronary  angiography and left ventriculography.   INDICATION:  [name] is a 60 year old woman with a 30-year history of  diabetes mellitus with complications of peripheral vascular disease,  diabetic nephropathy, neuropathy and retinopathy.  She has been having  symptoms of dyspnea and chest pain.  An echocardiogram was interpreted as  showing hypokinesis of the inferior wall with mildly depressed ejection  fraction.  Because of her longstanding diabetes and abnormal echocardiogram  as well as evidence of congestive heart failure on presentation, we  proceeded with right and left heart catheterization to assess her  hemodynamics and coronary anatomy.   PROCEDURAL NOTE:  An 8-French sheath was placed in the right femoral vein, 6-  French sheath in the right femoral artery. Right heart catheterization was  performed with a Swan-Ganz catheter.  Coronary angiography was performed  with 6-French JL-4 and JR-4 catheters.  Left ventriculography was performed  with an angled pigtail catheter.  Contrast was Omnipaque.  There were no  complications.   RESULTS:   HEMODYNAMICS:  Right atrial mean pressure 12.  Right ventricular pressure  44/15.  Pulmonary artery pressure 38/25.  Pulmonary capillary wedge mean  pressure 14.  Left ventricular pressure 160/23.  Aortic pressure 160/80.   Cardiac output by the thermodilution method is 5.3.  Cardiac index 3.0.  Cardiac output by the Fick method is 4.7.  Cardiac index 2.7.   LEFT  VENTRICULOGRAM:  Wall motion is normal.  Ejection fraction is  calculated at 58%.  There is no mitral.   ABDOMINAL AORTOGRAM:  Reveals a 70% stenosis in the right renal artery.  Left renal artery is patent.  There is very mild atherosclerotic disease of  the abdominal aorta and iliac arteries.   CORONARY ARTERIOGRAPHY (LEFT __________):  1. Left main is normal.  2. Left anterior descending artery has minor luminal irregularities.  3. The LAD gives rise to a normal size diagonal branch.  4. Left circumflex is a large dominant vessel.  There is a 20% stenosis in     the proximal circumflex and a 30% stenosis in the mid circumflex just     after a large third obtuse marginal branch.  Circumflex gives rise to a     small size first and second obtuse marginals, a large third obtuse     marginal.  The distal  circumflex gives rise to two small posterior     lateral branches followed by  a large posterior descending artery.  5. Right coronary artery is a small dominant vessel which is     angiographically normal.   IMPRESSION:  1. Mildly elevated left and right heart filling pressures with mild     pulmonary hypertension.  2. Left ventricular systolic function in the lower range of normal.  3. No significant coronary artery disease identified.   PLAN:  The patient will be managed medically.                                               Junious Silk, M.D. [facility]    MWP/MEDQ  D:  05/05/2003  T:  05/05/2003  Job:  [id number]   cc:   Edythe Lynn, M.D.  [place], [place] [address]  Fax: [phone]

## 2011-02-14 NOTE — Assessment & Plan Note (Signed)
[facility]                         GASTROENTEROLOGY OFFICE NOTE   [name], [name]                       MRN:          [id number]  DATE:12/30/2006                            DOB:          07-19-1951    REFERRING PHYSICIAN:  Burton Apley, M.D.   REASON FOR REFERRAL:  Colorectal cancer screening, chronic diarrhea.   HISTORY OF PRESENT ILLNESS:  [name] is a 60 year old woman who has  had chronic loose stools for many years.  She has tried Imodium.  She  was most recently started on paregoric, and she feels much better on  that.  She is not interested in further workup for her loose stools  since she is feeling better.  She was actually sent over here to discuss  colorectal cancer screening as well.  She has never had bleeding.  She  tells me she sent in some stool cards recently for fecal occult testing,  and she is not clear on those results.   REVIEW OF SYSTEMS:  Essentially normal, and is available on her nursing  intake sheet.   PAST MEDICAL HISTORY:  1. Hypertension.  2. Diabetes.  3. Elevated cholesterol.  4. Status post hysterectomy.   CURRENT MEDICATIONS:  Travatan eye drops, aspirin, Allegra, Lantus,  Imdur, Lasix, Novolin, Zocor, lisinopril, Neurontin, ibuprofen, Toprol.   ALLERGIES:  1. CODEINE.  2. PERCOCET.  3. HYDROCODONE.  4. DARVOCET.   SOCIAL HISTORY:  Single.  Lives by herself.  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  Smokes 1/2 pack of cigarettes a day.  Nondrinker.   FAMILY HISTORY:  No colon cancer or colon polyps in family.   PHYSICAL EXAMINATION:  Height 5 feet 3 inches, 150 pounds.  Blood  pressure 120/68.  Pulse 60.  CONSTITUTIONAL:  Obese, otherwise well appearing.  NEUROLOGIC:  Alert and oriented x3.  Eyes:  Extraocular movements intact.  Mouth:  Oropharynx moist.  No  lesions.  NECK:  Supple.  No lymphadenopathy.  CARDIOVASCULAR:  Heart regular rate and rhythm.  LUNGS:  Clear to auscultation  bilaterally.  ABDOMEN:  Soft, non-tender, non-distended, normal bowel sounds.  EXTREMITIES:  No lower extremity edema.  SKIN:  No rashes or lesions on visible extremities.   A 60 year old woman with chronic loose stools, routine risk for  colorectal cancer screening.  [name] was recently placed on  Lomotil, which she takes, and she is greatly relieved from her chronic  loose stools.  I explained that the first step to working this up would  be a colonoscopy, and that would also be the way to best screen her for  colorectal cancer as well.  She adamantly declines colonoscopy.  She is  not interested at all, and she questioned why she was sent here in the  first place.  I gave her my card.  She knows to get back in touch if she  changes her mind about colorectal cancer screening, working up her  diarrhea, and a colonoscopy.     Milus Banister, MD  Electronically Signed    DPJ/MedQ  DD: 12/30/2006  DT: 12/30/2006  Job #:  [id number]   cc:   Mohammed Kindle, M.D.  Burton Apley, M.D.

## 2011-02-14 NOTE — Procedures (Signed)
HISTORY OF PRESENT ILLNESS:  This patient is a 60 year old with some  alteration of mental status with out of hospital rest.  The patient is being  evaluated for the altered mental status.  This is a routine EEG.  No skull  defection noted.   MEDICATIONS:  1.  Lasix.  2.  Captopril.  3.  Imdur.  4.  Toprol.  5.  Atrovent.  6.  Insulin.  7.  Seroquel.   This is a routine EEG.  No skill defection noted.   EEG CLASSIFICATION:  Chest arrhythmia grade 1 generalized:   DESCRIPTION OR PROCEDURE:  According to the background rhythms, this  recording consists of a moderately well modulated, medium amplitude rhythm  of 7 Hertz that is reactive to eye opening and closure.  As the record  progresses, the initial phases of recording were fault with head movement  and muscle artifact, therefore, quite prominent, largely obscuring the  background rhythm activities.  The patient calmed down towards the end of  the recording, and a 7 Hertz, more symmetric, theta activity was seen in the  background rhythms.  At no time during the recording there appeared to be  evidence of spike or spike wave discharges or evidence of focal slowing.  EKG monitor shows no evidence of cardiac arrhythmias with heart rate of 78.  Photic stimulation and hyperventilation were not performed.   IMPRESSION:  This is a mildly abnormal EEG recording due to mild background  slowing.  Such a recording is nonspecific and can be seen with any process  resulting in a mild metabolic or toxic encephalopathy or underlying demented  illness.  No epileptiform discharges were seen at any time.  This was a  technically difficult study due to excessive head movement artifact during  the initial phases or recording.    Jill Alexanders, M.D.   [id number]  D:  06/10/2004 19:49:22  T:  06/11/2004 07:01:04  Job #:  [id number]

## 2011-02-14 NOTE — Discharge Summary (Signed)
[name] [name], [name] [name] NO.:  [id number]   MEDICAL RECORD NO.:  [id number]          PATIENT TYPE:  INP   LOCATION:  2003                         FACILITY:  [facility]   PHYSICIAN:  Evette Doffing, M.D.  DATE OF BIRTH:  1951/01/30   DATE OF ADMISSION:  06/02/2004  DATE OF DISCHARGE:  06/18/2004                                 DISCHARGE SUMMARY   DISCHARGE DIAGNOSES:  1.  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  2.  Status post cardiac arrest, probably secondary to metabolic cause.  3.  Hypertension.  4.  Diabetes type 2, insulin-dependent.  5.  Cardiomyopathy, left ventricular ejection fraction 35-40%.  6.  Leukocytosis.   DISCHARGE MEDICATIONS:  1.  Insulin Lantus 18 units subcutaneous in the morning, 12 units      subcutaneous in the afternoon.  2.  Sliding scale insulin (we will send a copy of the protocol).  3.  Furosemide 40 mg one tablet a day.  4.  Captopril 25 mg one tablet three times a day.  5.  Hydralazine 25 mg one tablet three times a day.  6.  Imdur 30 mg one tablet a day.  7.  Metoprolol XL 75 mg one tablet a day.  8.  Aspirin 81 mg a day.  9.  K-Dur 10 mEq one tablet a day.  10. Seroquel 25 mg two tablets at night p.r.n.  11. Lansoprazole 30 mg one tablet a day.   DISPOSITION:  The patient to go to a nursing home.   CONSULTATIONS:  1.  Cardiology, Daniel Nones, M.D., September 5.  2.  Gregg W. Lovena Le, M.D., cardiology, on September 9.  Beach A. Jacolyn Reedy, M.D., on September 10.  She is from neurology.   PROCEDURES:  1.  Temporary intravenous pacemaker on September 5.  2.  Chest x-ray on June 02, 2004.  Impression:  Support tubes as      described, with cardiomegaly and pulmonary vascular congestion.  3.  VQ scan on September 5.  Indeterminate exam demonstrating decreased      perfusion to the left perihilar and lingular segments.  The patient's      radiograph earlier today shows increasing atelectasis in  this region,      and the distribution of the perfusion defect is not typical for      pulmonary embolism.  4.  2 D echo on September 5.  Summary:  Ejection fraction between 35-40%.      That is the main important point here.  5.  CT of the head with out contrast on September 5.  (1) No evidence of      acute intracranial abnormality.  (2) Remote lacunar infarct of the right      caudate.  6.  Abdominal x-ray on September 6:  Feeding tube tip in the region of the      duodenal bulb.  7.  MRI/MRA of the brain on September 9.  Impression:  Negative MRI of the      brain.  No evidence of acute stroke or chronic vascular disease.  Impression of the MRA, the angiography:  The study is degraded by      motion, showing no abnormality.  8.  Swallowing function test on September 12.  No report at this point.  9.  Electroencephalogram, September 12.  This is a mildly abnormal EEG      recording due to mild background slowing.  Such a recording is      nonspecific and can be seen with any process resulting in a mild      metabolic or toxic encephalopathy or underlying demented illness.  No      epileptiform discharges were seen at any time.  This was a technically      difficult study due to excessive head movement artifact during the      initial phases of the recording.   HISTORY OF PRESENT ILLNESS:  A 60 year old African-American woman with a  history of insulin-requiring diabetes and status post Dacron angioplasty of  distal stenosis of left femoral-popliteal bypass on May 21, 2004, who was  at her house when according to her sister (witness) had a sudden shortness  of breath, fell back on her bed, and became unconscious and hypopneic, going  later on to probable cardiopulmonary arrest.  Relative did some CPR and  called 911.  EMS arrived, and they did not find a pulse at the beginning but  after they put the monitor, they found that the patient had electrical  activity.  EMS put a  pacemaker in her and when she arrived to the emergency  room, the patient already had a pulse but she was very unstable.   ADMISSION LABORATORY DATA:  ABG:  7.29, PCO2 43, PO2 47, and bicarb 21.  Sodium 141, potassium 3.1, chloride 104, BUN 25, creatinine 2.1, glucose  347, anion gap 16.  CBC:  Hemoglobin 8.1, hematocrit 23.7, MCV 91.8, white  blood cell count 22, and platelets 285.  Absolute neutrophil count 5.6.  Urinalysis:  Nitrite negative, leukocyte negative, bacteria few, white blood  cells 3-6, red blood cells 0-2, protein more than 300.  PT 12.8, INR 1.0,  PTT 27.   HOSPITAL COURSE:  Problem 1.  ALTERED MENTAL STATUS:  The patient has been improving slowly  since she came to the emergency department.  At the beginning she was  unconscious, and she recovered consciousness.  At this point she is able to  be oriented only at place.  She cannot know the date or recognize persons  very well.   Problem 2.  STATUS POST CARDIAC ARREST:  The patient had bradycardia when  she come to the emergency department that required intravenous transient  pacemaker that was in place for 48 hours.  After the patient, the patient  remained stable and she has not had any event on the telemetry.   Problem 3.  HYPERTENSION:  Blood pressure was difficult to control after the  patient stabilized but after different medications were given, she has  remained stable and the drugs were already dictated.   Problem 4.  DIABETES:  She has been relatively well-controlled on sliding  scale insulin and Lantus b.i.d.  The doses of Lantus need to be adjusted  according to the intake because of the intakes of this PT have lately been  not very consistent due to we are trying to have most of the feeding by  mouth.   Problem 5.  LEUKOCYTOSIS:  Leukocytosis was followed.  All blood cultures  were negative.  Urine culture was negative.  Leukocytes have been coming back to normal with no intervention.   LABORATORY  DATA AT DISCHARGE:  CBC:  White blood cell count 13.5, hemoglobin  12.1, hematocrit 35.8, platelets 356, MCV 90.1.  Basic metabolic panel:  Sodium 0000000, potassium 4.3, chloride 108, CO2 22, glucose 97, BUN 31,  creatinine 1.7, calcium 9.6.   SPECIAL INSTRUCTIONS:  Diet is dysphagia II plus thin liquids and full  supervision with p.o.'s.  The patient also needs PT/OT.  Will need metabolic  panel tomorrow or Thursday to follow up creatinine and BUN.       YC/MEDQ  D:  06/18/2004  T:  06/18/2004  Job:  [id number]

## 2011-02-14 NOTE — Consult Note (Signed)
NAME:  [name] [name], [name] [name]                          ACCOUNT NO.:  [id number]   MEDICAL RECORD NO.:  [id number]                   PATIENT TYPE:  INP   LOCATION:  2929                                 FACILITY:  [facility]   PHYSICIAN:  Daniel Nones, M.D. [facility]      DATE OF BIRTH:  [birth year]   DATE OF CONSULTATION:  06/03/2004  DATE OF DISCHARGE:                                   CONSULTATION   Consult requested by Dr. Posey Pronto for placement of a temporary pacemaker.   HISTORY:  [name] [name] is a 60 year old woman with diabetes and peripheral  vascular disease who was brought to the emergency department after a cardiac  arrest.  According to the patient's family, she had been doing fairly well  since her surgery earlier in the week but on the evening of admission, she  had an episode where she became apneic and pulseless.  The episode was  described as just spontaneous unresponsiveness with eyes rolling in the back  of the head.  Within one to two minutes CPR was initiated and EMS was  called.  The patient reportedly was found to be in pulseless electrical  activity and bradyarrhythmia in the field.  She responded to epinephrine and  transcutaneous pacing.  In the emergency department she had repeated runs of  bradyarrhythmia, with which she lost her pulse.  She responded each time to  epinephrine.   PAST MEDICAL HISTORY:  1.  Diabetes.  2.  Peripheral vascular disease, status post recent vascular surgery.  3.  Hyperlipidemia.  4.  Chronic renal insufficiency.  5.  History of GI bleeding.   She is allergic to CODEINE.   MEDICATIONS:  1.  Insulin.  2.  Lipitor.  3.  Avandia.  4.  Lasix.  5.  Ibuprofen.  6.  Toprol.  7.  Amitriptyline.  8.  Gabapentin.  9.  Benazepril.  10. Tramadol.  [place].   FAMILY HISTORY:  Noncontributory.   SOCIAL HISTORY:  The patient does not smoke tobacco at the present time nor  drink alcohol.   REVIEW OF SYSTEMS:  Unobtainable.   PHYSICAL  EXAMINATION:  VITAL SIGNS:  She was afebrile.  Her blood pressure  at the time of my evaluation was approximately 90/60, her heart rate was in  the 50-60 range but would brady down into the 30s and respond to epinephrine  up into the 110s.  GENERAL:  She was an ill-appearing woman who was intubated and in marked  distress.  HEENT:  Unremarkable.  Her pupils did appear to react, and she did have a  gag reflex.  NECK:  Her JVP appeared normal.  Her carotid upstroke was palpable, and she  had no obvious bruits.  CHEST:  Her lungs were clear.  CARDIOVASCULAR:  Regular bradycardia without rub or gallop or murmur.  ABDOMEN:  Obese but nondistended, nontender.  EXTREMITIES:  Her extremities revealed the recent surgical changes.  NEUROLOGIC:  She  was intubated and sedated.  She did have a gag, and her  pupils were reactive.   Chest x-ray was pending.  ECG revealed normal sinus rhythm with no ischemia  or infarct.  Her labs:  The white count was 27.4, the hematocrit was 24.1,  platelets 276.  Sodium 141, potassium was pending.  Chloride 103,  bicarbonate 20, BUN 25, creatinine 2, glucose 423.  CK 27, MB 3.7, troponin  0.12.   ASSESSMENT AND PLAN:  1.  Bradyarrhythmia.  A temporary pacemaker was placed through the left      subclavian.  She had good capture with 1.5 MA, and the backup rate was      set at 70.  Chest x-ray is pending, and I will follow up on this to rule      out pneumothorax.  For a full procedure note, please refer to the      medical record.  2.  As far as the etiology to her cardiac arrest, the primary team has      pulmonary embolism high on their differential.  At this point, however,      it remains unclear, especially given her elevated white count.  We will      continue to follow her for management of the temporary pacemaker and      would be happy to participate in her care in any way that could be of      help.                                                Daniel Nones, M.D. [facility]    RPK/MEDQ  D:  06/03/2004  T:  06/03/2004  Job:  [id number]

## 2011-02-14 NOTE — H&P (Signed)
[name] [name], [name] [name]                ACCOUNT NO.:  [id number]   MEDICAL RECORD NO.:  [id number]           PATIENT TYPE:   LOCATION:                                 FACILITY:   PHYSICIAN:  Judeth Cornfield. Scot Dock, M.D.DATE OF BIRTH:   DATE OF ADMISSION:  10/29/2006  DATE OF DISCHARGE:                              HISTORY & PHYSICAL   REASON FOR ADMISSION:  Osteomyelitis of the left fifth toe.   HISTORY:  This is a pleasant 60 year old woman who developed a wound on  the lateral aspect of her left toe back in October of 2007.  This has  been treated aggressively as an outpatient by Dr. Amalia Hailey; however, a  recent x-ray suggest osteomyelitis of the toe.  For this reason, it was  felt that she would likely require amputation of the toe.   Of note, she has undergone bilateral fem-pop bypass grafts by Dr. Allean Found.  In October 2002, she had a left femoral above-knee pop bypass with a  vein graft and left second toe amputation by Dr. Allean Found.  Subsequently, in  May of 2003, she had a right femoral to below knee pop bypass with a  vein graft by Dr. Allean Found.  I revised the graft in August of 2005 and did  Dacron patch angioplasty of a distal stenosis of the left fem-pop graft.  I had explored her lesser saphenous vein which was not usable.  She now  presents with this nonhealing wound of the left fifth toe.   PAST MEDICAL HISTORY:  1. Is significant for type 1 diabetes.  2. Hypertension.  3. Hypercholesterolemia.  4. Diabetic nephropathy.  5. COPD.  6. Peripheral vascular disease.   She denies any history of myocardial infarction or congestive heart  failure.   FAMILY HISTORY:  There is no history of premature cardiovascular  disease.   ALLERGIES:  1. CODEINE.  2. VICODIN.  3. MORPHINE.  4. PERCOCET.   MEDICATIONS:  1. The patient did not have her medications with her but to the best      of her recollection she was on NovoLog insulin on a sliding scale.  2. Lantus 20 units subcutaneous  q.h.s.  3. Lipitor 10 mg p.o. daily.  4. Lasix 40 mg p.o. daily.  5. Toprol XL 300 mg p.o. daily.  6. Aspirin 81 mg p.o. daily.  7. Gabapentin 300 mg p.o. t.i.d.  8. Allegra 180 mg p.o. daily.  9. Isosorbide, she did not know the dose.  She will bring her      medications on the day of admission so that we can be sure to get      these right.   SOCIAL HISTORY:  She is single.  She has one child.  She quit tobacco in  2003.   REVIEW OF SYSTEMS:  GENERAL:  She has had no weight loss, weight gain,  problems with her appetite.  She has had no fever.  CARDIAC:  She had no  recent chest pain, chest pressure, palpitations or arrhythmias.  PULMONARY:  She had no recent bronchitis, asthma or wheezing.  GI:  She  has had no recent change in her bowel habits and has no history of  peptic ulcer disease.  GU:  She has had no dysuria or frequency.  VASCULAR:  She has had some pain in the left foot but no claudication or  rest pain.  NEURO:  She had no dizziness, blackouts, headaches or  seizures.  PSYCHIATRIC:  She has had no depression or nervousness.  HEMATOLOGIC:  She has had no bleeding problems or clotting disorders.   PHYSICAL EXAMINATION:  Blood pressure is 100/60, heart rate is 72.  I do  not detect any carotid bruits.  Lungs are clear bilaterally to  auscultation.  On cardiac exam, she has a regular rate and rhythm.  Abdomen is soft and nontender.  She has palpable femoral and popliteal  pulses bilaterally.  I cannot palpate pedal pulses, although she does  have brisk Doppler signals on the right with monophasic Doppler signals  on the left.  ABI on the right is 100% and on the left 72%.  She has a  wound on the lateral metatarsal head of the fifth toe on the left.  There is minimal drainage.   IMPRESSION:  This patient presents with osteomyelitis of the left fifth  toe and I think the only option is ray amputation of the left fifth toe.  I think her fem-pop bypass graft is patent and  hopefully she has  adequate circulation to heal this.  She will be admitted on the 31st for  surgery and we will teach her to be partial weightbearing on the left  with a Darco shoe postoperatively.  I have discussed the procedure and  potential complications with the patient.  She is agreeable to proceed.      Judeth Cornfield. Scot Dock, M.D.  Electronically Signed     CSD/MEDQ  D:  10/21/2006  T:  10/21/2006  Job:  [id number]   cc:   Leslye Peer. Tuchman, D.P.M.

## 2011-02-14 NOTE — H&P (Signed)
NAME:  [name] [name], [name] [name]                          ACCOUNT NO.:  [id number]   MEDICAL RECORD NO.:  [id number]                   PATIENT TYPE:  INP   LOCATION:                                       FACILITY:  [facility]   PHYSICIAN:  Judeth Cornfield. Scot Dock, M.D.        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   HISTORY:  This is a pleasant 60 year old woman who has undergone bilateral  fem/pop bypass grafts by Dr. Allean Found.  On July 01, 2001, she had a left  femoral to above knee popliteal artery bypass graft with a reversed  saphenous vein graft with left second toe amputation.  Subsequently, in May  2003, she had a right femoral to below knee popliteal artery bypass graft  with a nonreversed translocated saphenous vein graft.  After Dr. Allean Found left  town, I have been following these bypass grafts in the office.  She was  noted to have increased velocities at the distal anastomosis of her left  fem/pop bypass graft.  For this reason, she underwent an arteriogram which  showed a tight 80% stenosis just past the distal anastomosis of her left  femoral above knee popliteal bypass graft.  There were also two tandem  stenoses within the popliteal artery at the level of the knee.  There  appeared to be three vessel run off.  Given the progression of the stenosis  noted on Duplex and the arteriogram findings, it is felt that she is at high  risk for occlusion of her graft if the stenosis is not addressed.  She is  brought in for elective revision of her graft.   Of note, she underwent vein mapping in the office which shows that she does  not have any useable greater saphenous vein on either side, the remaining  segments in the legs could barely be identified.  The only real segment of  vein that we could find with Duplex scan that  appeared potentially useable  for a patch or potentially a very short segment bypass was the left lesser  saphenous vein.  Diameters ranged from 23 mm to 41 mm.   PAST MEDICAL HISTORY:  Significant for  1. Insulin dependent diabetes.  2. Hypertension.  3. Hypercholesterolemia.  4. History of chronic renal insufficiency, although she is not on dialysis.  5. History of a GI bleed in the past.  6. She denies any history of congestive heart failure or history of previous     myocardial infarction.   ALLERGIES:  Codeine, Darvocet, Vicodin.   SOCIAL HISTORY:  The patient is single and has one child.  She quit tobacco  two years ago.   MEDICATIONS:  Insulin 70/30, 45 units subcu q.a.m., insulin 70/30, 35 units  subcu q.p.m., Lipitor  10 mg p.o. daily, Avandia 4 mg p.o. daily, Lasix 80 mg  p.o. daily, ibuprofen 80 mg p.r.n. p.o., Toprol XL 50 mg p.o. daily,  Amitriptyline 150 mg p.o. daily, aspirin 81 mg daily, Travatan drops 2 h.s.,  Gabapentin 800 mg p.o. daily, Benazepril 20 mg p.o. daily, Tramadol 50 mg  p.o. daily, Allegra 180 mg p.o. daily.   REVIEW OF SYMPTOMS:  GENERAL:  She has had some mild weight gain.  She  denies any weight loss, fever, loss of appetite.  CARDIAC:  She denies any  chest pain.  She has occasional palpitations when she exerts herself and  also admits to dyspnea on exertion.  PULMONARY:  She has an occasional  productive cough and has a history of bronchitis.  She has had no recent  asthma or wheezing.  GI:  She has had no recent change in her bowel habits  except for some occasional diarrhea.  She has had no significant history of  ulcer disease.  GU:  She denies dysuria or frequency.  VASCULAR:  She has  had no history of stroke, TIAs, expressive or receptive aphasia, or  amaurosis fugax, she has had no DVT or phlebitis.  NEUROLOGICAL:  She denies  dizziness, black outs, headaches or seizures.  ORTHOPEDICS:  She does admit  to some arthritis and joint pain.   PSYCHIATRIC:  She has had no depression  or nervousness.  HEMATOLOGIC:  She denies bleeding problems or clotting  disorders.   PHYSICAL EXAMINATION:  VITAL SIGNS:  Blood pressure 130/72, heart rate 88.  NECK:  I did not detect any carotid bruits.  LUNGS:  Clear bilaterally to auscultation.  CARDIAC:  Regular rate and rhythm.  ABDOMEN:  Obese and difficult to assess.  She has palpable femoral pulses.  EXTREMITIES:  Her feet are both warm and well perfused with monophasic  Doppler signals.  She has no ischemic ulcerations on her feet.  NEUROLOGICAL:  Nonfocal.   I have reviewed the options with [name].  I think without graft  revision she is at high risk for occlusion of her graft which could  potentially be a limb threatening problem.  I have recommended revising her  graft.  The two options would  be either to simply patch the stenosis just  distal to the distal anastomosis versus jumping a segment of graft from her  old graft down to the below knee popliteal artery.  This would be  significantly more involved than a simple vein patch distally.  Which option  is best may be determined by the size of the vein and the length of the vein  that is available as this may be very limited.  If there is only limited  vein, then it would probably be best to have simply a vein patch of the 80%  stenosis just past the distal anastomosis.  If, on the other hand, she has a  nice segment of vein with good diameter and adequate length, we could  potentially jump down to the below knee popliteal artery, although the two  tandem stenoses in the popliteal artery are certainly not as significant as  the tighter stenosis just past the anastomosis.  We have discussed the  indications for the procedure and the potential complications including but  not limited to bleeding, wound healing problems, and graft thrombosis.  All her questions were answered and she is agreeable to proceed.  Her surgery  has  been scheduled for May 21, 2004.  Judeth Cornfield. Scot Dock, M.D.    CSD/MEDQ  D:  05/15/2004  T:  05/15/2004  Job:  [id number]

## 2011-02-14 NOTE — Discharge Summary (Signed)
NAMEJAYDE, ERWIN                ACCOUNT NO.:  [id number]   MEDICAL RECORD NO.:  [id number]          PATIENT TYPE:  INP   LOCATION:  4712                         FACILITY:  [facility]   PHYSICIAN:  Judeth Cornfield. 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   PAST MEDICAL HISTORY:  1.  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 Allean Found with left femoral to above knee      popliteal bypass on July 01, 2001 and right femoral to below the knee      popliteal bypass in May 2003.  2.  Diabetes mellitus type 1.  3.  Hypertension.  4.  Hypercholesterolemia.  5.  Chronic renal insufficiency.  6.  History of GI bleed.   ALLERGIES:  1.  CODEINE.  2.  DARVOCET.  3.  VICODIN.   DISCHARGE DIAGNOSIS:  1.  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   BRIEF HISTORY:  The patient is a 60 year old African-American female.  She  has been followed at the [facility] office by Dr. Scot Dock since Dr. Allean Found left town  in 2003.  At a recent follow-up, she was noted to have increased velocities  at the distal anastomosis of her left femoral popliteal bypass graft.  An  arteriogram revealed a tight 80% stenosis just past the distal anastomosis  of the left femoral above the knee popliteal bypass graft.  Dr. Scot Dock  recommended surgical repair of this stenosis for limb salvage.  The  procedure and its risks and benefits were discussed with the patient; she  agreed to proceed with surgery.   HOSPITAL COURSE:  On May 21, 2004 the patient was electively admitted to  [facility] under the care of Dr. [name].  She  underwent the following surgical procedure:  Dacron patch  angioplasty of the  distal left femoral popliteal bypass vein graft stenosis.  She tolerated  this procedure well and was transferred in stable condition to the PACU.  She remained hemodynamically stable in the postoperative period. Her  postoperative course was essentially uneventful although she did have some  problem with pain control.  For this reason, she was not discharged on  postoperative day one. By postoperative day two, her pain control was much  improved.  However, there was some superficial wound dehiscence of her left  leg incision.  This was closed with 3-0 nylon suture at the bedside.  Home  health services were arranged through [facility] for dressing  supplies and dressing changes at home.  The patient was ready for discharge  home on the afternoon of August 25.   DISCHARGE CONDITION:  Improved.   DISCHARGE MEDICATIONS:  She is to resume her home medications of:  1.  Insulin 70/30 - 45 units in the  a.m. and 35 units in the p.m.  2.  Lipitor 10 mg daily.  3.  Avandia 4 mg daily.  4.  Lasix 80 mg daily.  5.  Ibuprofen 800 mg p.r.n.  6.  Toprol XL 50 mg daily.  7.  Amitriptyline 150 mg daily.  8.  Aspirin 81 mg daily.  9.  Travatan drops two h.s.  10. Gabapentin 800 mg daily.  11. Donezepil 20 mg daily.  12. Tramadol 50 mg daily.  13. Allegra 180 mg daily.  14. For pain management of her surgical incision, Ultram 50 mg q.6h p.r.n.   ACTIVITY:  She is to increase her walking as tolerated.   DIET:  Her diet is restricted only by diabetic restrictions.   WOUND CARE:  She may shower at home.  If her incision shows any sign of  infection, she is to call Dr. Nicole Cella office.   FOLLOW UP:  She is to follow-up with Dr. Scot Dock at the [place] office and the  office will call for a date and time for that appointment.       CTK/MEDQ  D:  07/05/2004  T:  07/06/2004  Job:  [id number]

## 2011-02-14 NOTE — Procedures (Signed)
[place]  Patient:    MIXTLI, TOWLES Visit Number: [id number] MRN: [id number]          Service Type: MED Location: [phone] Attending Physician:  Derry Lory Dictated by:   Rosetta Posner, M.D. Proc. Date: 06/30/01 Admit Date:  06/29/2001   CC:         Nelda Severe. Kellie Simmering, M.D.  Richard C. Tuchman, D.P.M.   Procedure Report  PREOPERATIVE DIAGNOSIS:  Nonhealing left foot lesion.  POSTOPERATIVE DIAGNOSIS:  Nonhealing left foot lesion.  PROCEDURE:  Aortogram with bilateral lower extremity runoff.  SURGEON:  Rosetta Posner, M.D.  ANESTHESIA:  1% lidocaine local.  COMPLICATIONS:  None.  DISPOSITION:  To holding area stable.  DESCRIPTION OF PROCEDURE:  The patient was taken to the peripheral vascular catheterization lab, placed in the supine position, where the area of both groins was prepped and draped in the usual sterile fashion.  Using local anesthesia and a single wall puncture, the right common femoral artery was entered and a guidewire was passed up to the level of the suprarenal aorta. A 5 French sheath was passed over the guidewire, and a pigtail catheter was passed to the level of the renal arteries.  AP projection was undertaken at this level, and this revealed single, widely patent renal arteries bilaterally.  The patient had no evidence of narrowing in the aortoiliac segments.  There was some mild irregularity in the aortoiliac segments.  The pigtail catheter was then withdrawn down to the level of the aortic bifurcation, and pelvic and runoff was obtained through this injection.  Again the iliac system was widely patent bilaterally.  The superficial femoral artery and profunda femoris artery was patent proximally.  On the left there was subtotal occlusion at the adductor canal and also at the above-knee popliteal segment.  The left popliteal artery was reconstituted and was widely patent.  There was three-vessel  runoff on the left with narrowing at the takeoff of the posterior tibial artery with widely patent peroneal and anterior tibial arteries.  On the right, the superficial femoral artery was patent.  There was moderate irregularity throughout with two levels of high-grade stenosis at the above-knee popliteal artery and below the adductor canal.  There was a small anterior tibial artery on the right with normal posterior tibial and peroneal arteries on the right.  The patient tolerated the procedure without immediate complication, was transferred to the holding area in stable condition.  FINDINGS: 1. No evidence of aortoiliac occlusive disease. 2. Bilateral superficial femoral artery disease as described. 3. Three-vessel tibial runoff on the left, two-vessel runoff on the right. Dictated by:   Rosetta Posner, M.D. Attending Physician:  Derry Lory DD:  06/30/01 TD:  06/30/01 Job: [id number]

## 2011-02-14 NOTE — Discharge Summary (Signed)
[facility]  Patient:    [name] [name], [name] [name]                       MRN: [id number] Adm. Date:  [id number] Disc. Date: [id number] Attending:  Lucia Gaskins CC:         [facility]   Discharge Summary  HISTORY OF PRESENT ILLNESS:     The patient is a 60 year old para 1 with myomatous uterus.  She presents for total abdominal hysterectomy and bilateral salpingo-oophorectomy.  Please see the history and physical for further details.  HOSPITAL COURSE:                The patient was admitted and underwent a TAHSBO and lysis of adhesions.  Again, see the dictated operative summary for further details.  The patients postoperative course was uneventful, and she was discharged to home on postoperative day #3 tolerating a regular diet.  DISCHARGE DIAGNOSIS:            Myomatous uterus.  PROCEDURE:                      TAHBSO and lysis of adhesions.  DISCHARGE CONDITION:            Good.  DIET:                           ADA diet.  DISCHARGE MEDICATIONS:          Preoperative medications as well as ibuprofen. A [place] patch was placed prior to discharge.  ACTIVITY:                       No strenuous activity, no intercourse and no driving for two weeks.  DISPOSITION:                    The patient was to return to the [facility] in three days for a stable removal and wound check. DD:  03/15/01 TD:  03/15/01 Job: [id number]

## 2011-02-14 NOTE — Op Note (Signed)
[name] [name], [name] [name]                ACCOUNT NO.:  [id number]   MEDICAL RECORD NO.:  [id number]          PATIENT TYPE:  INP   LOCATION:  2550                         FACILITY:  [facility]   PHYSICIAN:  Judeth Cornfield. Scot Dock, M.D.DATE OF BIRTH:  [birth year]   DATE OF PROCEDURE:  10/29/2006  DATE OF DISCHARGE:                               OPERATIVE REPORT   PREOPERATIVE DIAGNOSIS:  Osteomyelitis of left fifth toe.   POSTOPERATIVE DIAGNOSIS:  Osteomyelitis of left fifth toe.   PROCEDURE:  Right amputation of the left fifth toe.   SURGEON:  Judeth Cornfield. Scot Dock, M.D.   ASSISTANT:  Nurse.   ANESTHESIA:  Ankle block.   TECHNIQUE:  The patient was taken to the operating room after an ankle  block was placed by anesthesia.  The left foot was prepped and draped in  usual sterile fashion.  A tennis racket incision was made encompassing  the wound on the left fifth toe, and the dissection carried down to the  metatarsal which was sharply divided.  The toe was removed in its  entirety.  Hemostasis was obtained using electrocautery.  The bone was  rongeured back to healthy bone.  All devitalized tissue was debrided.  Hemostasis was obtained.  The wound was irrigated.  The deep layer was  closed with running 3-0 Vicryl.  The skin was closed with interrupted 3-  0 nylons.  Sterile dressing was applied.  The patient tolerated the  procedure well and was transferred to the recovery room in satisfactory  condition.  All needle and sponge counts were correct.      Judeth Cornfield. Scot Dock, M.D.  Electronically Signed     CSD/MEDQ  D:  10/29/2006  T:  10/29/2006  Job:  [id number]

## 2011-02-14 NOTE — Op Note (Signed)
[place]. [facility]  Patient:    [name] [name], [name] [name] Visit Number: [id number] MRN: [id number]          Service Type: MED Location: [phone] Attending Physician:  Derry Lory Dictated by:   Mechele Collin, M.D. Proc. Date: 07/01/01 Admit Date:  06/29/2001 Discharge Date: 07/10/2001   CC:         [facility] Office   Operative Report  PREOPERATIVE DIAGNOSIS:  Left foot ischemia with gangrenous left second toe.  POSTOPERATIVE DIAGNOSIS:  Left foot ischemic with gangrenous left second toe.  OPERATIONS PERFORMED: 1. Left above-knee popliteal artery bypass with reversed saphenous vein graft. 2. Left second toe amputation.  SURGEON:  Mechele Collin, M.D.  FIRST ASSISTANT:  Jonn Shingles, M.D.  SECOND ASSISTANT:  Lynnell Catalan, P.A.  ANESTHESIA:  General endotracheal.  ESTIMATED BLOOD LOSS:  Fifty cc.  DRAINS:  None.  SPECIMENS:  None.  COMPLICATIONS:  None.  BRIEF HISTORY:  This is a 60 year old female with diabetes and peripheral vascular disease.  She has developed gangrene of the left second toe.  She underwent an arteriogram and was found to have SFA occlusion with reconstitution to the above-knee segment of the popliteal artery.  She was felt to require in line flow for healing and was scheduled for a left fem-pop and a left second toe amputation.  The risks, benefits and alternatives to the procedure were explained in detail to the patient.  She agreed to proceed.  DESCRIPTION OF PROCEDURE:  Patient was brought to the operating room and placed on the operating table in the supine position.  Following adequate general endotracheal anesthesia the left lower extremity was draped and prepped circumferentially from the groin to the foot.  A vertical incision was made in the left groin and the femoral artery was identified at this level.  The common femoral, profunda femoris and superficial femoral arteries were dissected  circumferentially and encircled with vessel loops.   Next, the greater saphenous vein was identified in the saphenofemoral junction and exposed from the groin to the knee, leaving intermittent skin bridges.  All side branches were ligated with 4-0 silk ties and divided.  The above-knee popliteal artery was exposed through the saphenectomy wound on the medial aspect of the left thigh.  The artery was encircled with vessel loops proximally and distally.  Next, the vein was harvested and the distal stump was oversewn with 2-0 silk suture ligature.  The resulting defect at the saphenofemoral junction from the vein harvested was oversewn with 5-0 Prolene suture.  Next, the vein was placed on the back table in separate saline solution.  An [place] tunneler was then used to create an anatomic tunnel from the planned area of distal anastomosis through to the femoral incision deep to the sartorius muscle.  The patient was then systemically heparinized.  Following adequate three minute circulation time the vessel loops around the common femoral, superficial femoral and profunda femoris arteries were tied to occlude flow.  Longitudinal arteriotomy was performed over the distal common femoral artery.  The vein was then brought into the operative field, reversed in its orientation and then spatulated.  An end-to-side anastomosis was created with a running 6-0 Prolene suture.  Following completion of the anastomosis a bulldog was placed on the graft just beyond the anastomosis and the vessel loops were released.  There was minimal bleed from the anastomotic suture line.  The vein was then distended throughout its length and then marked on  its anterior surface with a marking pen.  The vein was then passed in the [place] tunneler through the sheath and brought out into the lower wound.  Next, the vessel loops surrounding the popliteal artery were tied to occlude flow.  Longitudinal arteriotomy  was performed.  The vein was then trimmed to the appropriate length and spatulated.  An end-to-side anastomosis was created with running 6-0 Prolene suture.  Prior to completion of the anastomosis the popliteal artery was backbled and the vein was flushed.  There was excellent inflow through the bypass graft.  The anastomosis was then completed, then clamps were released restoring flow. At this point there was excellent distal flow in the popliteal artery and excellent biphasic Doppler flow in the level of the ankle that was graft dependent.  Hemostasis was achieved in both wounds and 20 mg of protamine was administered.  The wounds were then irrigated copiously with warm sterile saline.  All wounds were closed in layers with 2-0 and 3-0 Vicryl sutures, followed by skin closure with skin staples.  Sterile dry dressings were applied.  Next, attention was turned to the left second toe.  A circumferential incision was made at the base of the second toe.  All necrotic tissue was debrided away sharply.  The left toe was divided at the base of the  digital joint at the metatarsal head with a large bone cutter.  The metatarsal head was then debrided back.  There was good bleeding at the level of the amputation site. The wound was copiously irrigated with warm sterile saline and closed loosely with interrupted nylon suture and a quarter inch Penrose drain was left behind, and brought out the same incision.  Sterile dry dressings were applied.  The patient was then awakened from anesthesia and transferred to the recovery room in stable condition.  The patient tolerated the procedure well.  No complications.  All needle and sponge counts were correct.  It should be noted that a completion arteriogram was performed during the procedure, prior to amputation of the toe, which showed excellent flow in the vein and no evidence of proximal or distal anastomotic stenosis.  Dictated by:   Mechele Collin, M.D. Attending Physician:  Derry Lory DD:  09/08/01 TD:  09/08/01 Job: [id number]

## 2011-02-14 NOTE — Discharge Summary (Signed)
[place]. [facility]  Patient:    [name] [name], [name] [name] Visit Number: [id number] MRN: [id number]          Service Type: SUR Location: 2000 2018 01 Attending Physician:  Mechele Collin Dictated by:   Vernard Gambles, P.A. Admit Date:  02/02/2002 Discharge Date: 02/08/2002   CC:         Richard C. Tuchman, D.P.M.  Dr. Treasa School   Discharge Summary  DATE OF BIRTH:  [birth year]  ADMISSION DIAGNOSES: 1. Peripheral vascular disease with ischemic left foot. 2. Insulin-dependent diabetes mellitus. 3. Hypertension.  DISCHARGE DIAGNOSIS:  Status post left femoral to popliteal bypass graft surgery and second toe amputation.  PROCEDURES: 1. Aortogram with left lower extremity run off on October 1. 2. Left femoral to popliteal bypass graft with reverse saphenous vein graft on    October 3. 3. Left second toe amputation.  HISTORY OF PRESENT ILLNESS:  This patient is a 60 year old, African-American female with a history of insulin-dependent diabetes mellitus.  She presented with three- to four-week history of nonhealing left second toe ulcer.  She has been treated locally with antibiotics.  She was found on exam to have gangrene and nonpalpable distal pulses.  Dopplers revealed bilateral superficial femoral artery disease and tibial disease with left ABI of 0.58 and right of 0.46.  HOSPITAL COURSE:  She was admitted from the [facility] office.  She was placed on IV antibiotics.  She underwent exam on June 29, 2001, and tolerated this well. She underwent a femoral to popliteal bypass graft with reverse saphenous vein graft and left second toe amputation on July 01, 2001, by Dr. Benson Setting. She tolerated that procedure well.  Her postoperative course was essentially uneventful.  She remained hemodynamically stable.  Her postoperative ABIs indicated left side of 0.81 and right side of 0.65, both improved preoperatively.  She was ambulating with physical therapy and  tolerated that well and progressed to being able to ambulate independently with a walker. Again, she remained hemodynamically stable.  She was tolerating her diet.  Her lower extremities remained well-profused.  Her wounds had remained clean and dry without any signs of infection.  She is anticipated for discharge the morning of July 09, 2001, if she continues to remain stable.  CONDITION ON DISCHARGE:  Stable.  DISCHARGE MEDICATIONS: 1. Amitriptyline 150 mg at bedtime. 2. Insulin 70/30 45 units in the a.m. and 35 units in the p.m. 3. Neurontin 800 mg at bedtime. 4. Lotensin 20 mg daily. 5. Estradiol 1 mg daily. 6. Ultram for pain one to two tablets every four to six hours as needed for    pain.  ACTIVITY:  The patient is instructed not to do any driving to avoid heavy lifting or strenuous activity.  She is to continue her breathing exercises and to continue to walk daily.  DIET:  Follow a heart-healthy, diabetic diet.  SPECIAL INSTRUCTIONS:  She may shower and wash her incisions with mild soap and water.  She will have home health nurse to do daily dressing changes to her amputation site.  She is notified to call the office with any increased temperatures greater than 101 degrees Fahrenheit or if she has any increased redness, swelling or drainage from her incisions.  FOLLOWUP:  She has a followup staple removal appointment in one week. Appointment to see Dr. Allean Found with ankle brachial indices in approximately two weeks and the office will schedule that appointment at her staple removal appointment. Dictated by:  Vernard Gambles, P.A. Attending Physician:  Mechele Collin. DD:  07/08/01 TD:  07/09/01 Job: [id number]

## 2011-02-14 NOTE — Cardiovascular Report (Signed)
NAME:  [name] [name], BOEHLER                          ACCOUNT NO.:  [id number]   MEDICAL RECORD NO.:  [id number]                   PATIENT TYPE:  INP   LOCATION:  2003                                 FACILITY:  [facility]   PHYSICIAN:  Loretha Brasil. Lia Foyer, M.D. [facility]         DATE OF BIRTH:  16-Mar-1951   DATE OF PROCEDURE:  06/17/2004  DATE OF DISCHARGE:                              CARDIAC CATHETERIZATION   PROCEDURES:  1.  Left heart catheterization.  2.  Selective coronary arteriography.  3.  Selective left ventriculography.   DESCRIPTION OF PROCEDURE:  The procedure is performed from the right femoral  artery.  The procedure was done with minimal contrast load.  She tolerated  the procedure.  There were no complications.   HEMODYNAMIC DATA:  1.  Central aorta:  150/84.  2.  Left ventricle:  156/7.  3.  No gradient on pullback across the aortic valve.   ANGIOGRAPHIC DATA:  1.  Ventriculography was performed in the RAO projection.  EF appeared to be      more than 55%.  No definite wall motion abnormalities were seen.  2.  The left main was free of critical disease.  3.  The left anterior descending artery coursed to the apex.  There was a      first diagonal branch which in some views had 50 to perhaps no more than      70% narrowing in a mid portion of the diagonal.  This did not appear to      be critical.  The distal vessel wrapped the apex.  4.  The circumflex      was dominant vessel.  There was a fairly large marginal branch which had      40% segmental narrowing.  There was minor plaquing in the main body of      the vessel, but no critical narrowing.  4.  The right coronary artery is a nondominant vessel.   CONCLUSIONS:  1.  Preserved overall left ventricular systolic function.  2.  No critical coronary obstruction at the present time.   DISPOSITION:  I will defer the decision regarding further treatment to Dr.  Lattie Haw.      TDS/MEDQ  D:  06/17/2004  T:  06/17/2004  Job:   [id number]   cc:   Jacqulyn Ducking, M.D.

## 2011-02-14 NOTE — Op Note (Signed)
NAME:  [name] [name], [name] [name]                          ACCOUNT NO.:  [id number]   MEDICAL RECORD NO.:  [id number]                   PATIENT TYPE:  OIB   LOCATION:  2899                                 FACILITY:  [facility]   PHYSICIAN:  Judeth Cornfield. Scot Dock, M.D.        DATE OF BIRTH:  07/27/1951   DATE OF PROCEDURE:  05/13/2004  DATE OF DISCHARGE:                                 OPERATIVE REPORT   PREOPERATIVE DIAGNOSIS:  Stenosis of distal femoral-popliteal bypass graft  of the left leg.   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   OPERATION PERFORMED:  1. Bilateral iliac arteriogram.  2. Left lower extremity runoff.  3. Right lower extremity runoff.   SURGEON:  Judeth Cornfield. Scot Dock, M.D.   ASSISTANT:  Nurse.   ANESTHESIA:  Local with sedation.   DESCRIPTION OF PROCEDURE:  The patient was taken to the peripheral vascular  lab at [facility] and sedated with 1 mg of Versed and 50 mcg of  fentanyl.  She later received an additional 1 mg of Versed.  Both groins  were prepped and draped in the usual sterile fashion.  The patient had  bilateral femoral-popliteal bypass grafts.  I elected to stick the left  groin and try to stay above the origin of the bypass graft.  I identified  the level of the inguinal ligament and stuck just below this level after the  skin was anesthetized.  A guidewire was introduced without difficulty and a  5 [place] sheath introduced over the wire.  A oblique projection of the left  iliac system was obtained and then left lower extremity runoff film obtained  through the sheath.  I then replaced the wire and positioned an IMA catheter  in the proximal right common iliac artery.  A right iliac and right lower  extremity film were obtained through the catheter in the right common iliac  artery.   FINDINGS:  The common iliac artery and external iliac artery were widely  patent bilaterally as are the  hypogastric arteries.  On the left side, the  common femoral and deep femoral artery were widely patent.  The native  superficial femoral artery was occluded at its origin.  There is  reconstitution of the above-knee popliteal artery.  The bypass graft was a  vein graft which goes to the above-knee popliteal artery segment and this  was widely patent.  Just distal to the anastomosis there was a 70 to 80%  stenosis.  The popliteal artery at the level of the knee also had two tandem  stenoses and the very distal below-knee popliteal artery was patent.  There  was three vessel runoff on the left.  On the right side, the common femoral  artery and deep femoral artery were patent.  The superficial femoral artery  was occluded and the femoral-popliteal bypass graft was patent.  It was  anastomosed to  the below-knee popliteal artery which was widely patent and  there was three-vessel runoff on the right.   CONCLUSION:  1. Patent bilateral femoral-popliteal bypass grafts.  2. Stenosis just distal to the distal anastomosis of the left femoral-     popliteal bypass graft and two tandem stenoses     in the popliteal artery at the level of the knee.  A sterile dressing was applied, the patient tolerated the procedure well and  was transferred to the recovery room in satisfactory condition.  All needle  and sponge counts were correct.                                               Judeth Cornfield. Scot Dock, M.D.    CSD/MEDQ  D:  05/13/2004  T:  05/13/2004  Job:  [id number]

## 2011-02-14 NOTE — H&P (Signed)
[place]. [facility]  Patient:    [name] [name], [name] [name] Visit Number: [id number] MRN: [id number]          Service Type: DSU Location: [facility] 2899 46 Attending Physician:  Mechele Collin Dictated by:   Marcellus Scott, P.A. Admit Date:  01/27/2002 Discharge Date: 01/27/2002                           History and Physical  DATE OF BIRTH:  01-03-1951  PRIMARY CARE-GIVERS: 1. Dr. Posey Boyer of the [facility]. 2. Dr. Leslye Peer. Tuchman. 3. Dr. Newt Minion.  PRESENTING CIRCUMSTANCE:  "I get foot pain when I walk."  HISTORY OF PRESENT ILLNESS:  [name] [name] is a 60 year old African American female with a history of long-term diabetes mellitus and infrainguinal arterial occlusive disease.  She underwent left femoral-to-popliteal bypass on June 29, 2001 with concurrent left second toe amputation for an ischemic, gangrenous toe.  This has healed nicely.  She now complains of intermittent right foot pain at rest and has lifestyle-limiting claudication on the right foot.  She has had an arteriogram with bilateral runoff and possible stenting today, to present for a right femoral-to-popliteal bypass, Jan 31, 2002.  She will have vein mapping of the right lower extremity at [facility], Jan 28, 2002.  ALLERGIES:  CODEINE, DARVOCET and VICODIN.  MEDICATIONS:  1. Humulin 70/30 -- 40 units subcu in the morning, 50 units subcu in the     evening.  2. Neurontin 800 mg daily.  3. Lotensin 20 mg daily.  4. Enteric-coated aspirin 81 mg daily.  5. Lipitor 10 mg daily at bedtime.  6. Allegra 180 mg daily.  7. Lasix 20 mg daily.  8. Ultram 50 mg orally every six hours as needed for pain.  9. Tussionex as needed for cough.  PAST MEDICAL HISTORY:  1. Insulin-dependent diabetes mellitus, diagnosed 28 years ago.  2. Hypertension.  3. Hypercholesterolemia.  4. History of tobacco habituation.  5. History of significant ethanol abuse.  6. Infrainguinal  arterial occlusive disease.  7. Diabetic nephropathy.  8. Recent metatarsal fracture with monitoring by Dr. Newt Minion, healing     for the next two months.  9. History of GI bleed at the time of fibroid surgery, May of 2002. 10. Chronic sinusitis. 11. Chronic obstructive pulmonary disease/chronic bronchitis.  PAST SURGICAL HISTORY:  1. Left femoral-to-popliteal bypass and left second toe amputation,     June 29, 2001.  2. Status post fibroid tumor removal and hysterectomy, Feb 26, 2001.  3. 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  4. Status post tonsillectomy and adenoidectomy in childhood.  REVIEW OF SYSTEMS:  The patient denies any prior history of myocardial infarctions, congestive heart failure, cerebrovascular accident, transient ischemic attack, seizure, gastroesophageal reflux disease, gout, peptic ulcer disease.  She has no diagnosis of cancer, no cataracts.  She does have a morning cough and chronic cough which is productive of green or brown sputum, worse in the past two months.  As mentioned above, she does have difficulty ambulating and gets right foot pain with any significant ambulation and sometimes has foot pain at rest.  She complains of a chronic cough but does not have any significant dyspnea.  She does have a diabetic neuropathy with some burning and the feet have some insensitivity to feeling in the feet.  SOCIAL HISTORY:  She is single and has  one son, age 69, who is healthy.  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  She does not partake of tobacco products at the present but has a 28-year history of a 1-1/2 pack per days, quit 2 months ago.  She does take an occasional beer at present but had heavy ethanol usage, primarily vodka, having quit heavy usage four years ago.  FAMILY HISTORY:  Mother died at age 35 of a myocardial  infarction.  She had had a cerebrovascular accident and had a long history of diabetes.  Father died at age 29; he also had experienced a cerebrovascular accident and myocardial infarction.  She has one brother who is healthy; one brother, she does not know his health status, he lives in [place].  Two sisters died of brain tumors, one sister has asthma, living, and one sister living with a heart murmur.    PHYSICAL EXAMINATION:  GENERAL:  The patient is alert and oriented x 3.  She is pleasant.  She is in no acute distress.  She has just undergone an arteriogram and is resting on the post-arteriogram protocol at [place]:  Temperature 97.5, pulse 78 and regular, respirations 18 and even, blood pressure 126/78.  Height 5 feet 1 inch.  Weight 175 pounds.  HEENT:  Eyes:  Pupils are equal, round and reactive to light.  Extraocular movements intact.  The oropharynx exam shows that she is edentulous.  She has no lesions or erythema.  She wears both upper and lower dentures.  NECK:  Supple.  No jugular venous distention.  No carotid bruits auscultated.  CHEST:  Lungs relatively clear bilaterally.  HEART:  Regular rate and rhythm without murmur.  ABDOMEN:  Obese, soft, nondistended, nontender.  Bowel sounds are present throughout.  She has well-healed incisions at the groin from abdominal surgery and from her previous left femoral-to-popliteal bypass.  EXTREMITIES:  No obvious evidence of clubbing or cyanosis.  The right hand bears scars from ulcerations at the right index and right third fingers. Radial pulses are 4/4 bilaterally.  Femoral pulses are 4/4 bilaterally.  On the right foot, pulses are absent and the foot is cool.  She does not have varicosities.  She has strong right dorsalis pedis and posterior tibial signals by Doppler, a weak right peroneal by Doppler.  Left lower extremity has well-healed incisions on the left thigh secondary to a  left femoral-to-popliteal bypass.  She has palpable pedal pulses on the left.  The  left second toe amputation site is well-healed.  She has onychomycotic toenails.  NEUROLOGIC:  Exam is grossly intact.  IMPRESSION:  1. Ischemic right foot with claudication symptoms, no ulcerations.  2. For further significant impressions, please see past medical history and     past surgical history.  PLAN:  Right femoral-to-popliteal bypass, Dr. Mechele Collin, on Jan 31, 2002. She will undergo vein mapping at the office of [facility], Jan 28, 2002. Dictated by:   Marcellus Scott, P.A. Attending Physician:  Mechele Collin. DD:  01/27/02 TD:  01/28/02 Job: [id number]

## 2011-02-14 NOTE — Discharge Summary (Signed)
[name] [name], PETRASH                ACCOUNT NO.:  [id number]   MEDICAL RECORD NO.:  [id number]          PATIENT TYPE:  INP   LOCATION:  2003                         FACILITY:  [facility]   PHYSICIAN:  Yong Channel, MD        DATE OF BIRTH:  1951-04-03   DATE OF ADMISSION:  06/02/2004  DATE OF DISCHARGE:  06/18/2004                                 DISCHARGE SUMMARY   DISCHARGE DIAGNOSES:  1.  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  2.  Status post cardiac arrest probably secondary to metabolic cause.  3.  Hypertension.  4.  Diabetes type 2, insulin dependent.  5.  Cardiomyopathy with left ventricular ejection fraction from 35-40%.  6.  Leukocytosis.   DISCHARGE MEDICATIONS:  1.  Insulin Lantus 18 units in the morning, 12 units subcu afternoon plus      sliding scale insulin.  2.  Furosemide 40 mg q.d.  3.  Captopril 25 mg q. 8 hours.  4.  Hydralazine 25 mg q. 8 hours.  5.  Imdur 30 mg once a day.  6.  Metoprolol XL 75 mg q.d.  7.  Aspirin 81 mg q.d.  8.  K-Dur 10 mEq once a day.  9.  Seroquel 25 mg two tablets at night p.r.n.  10. __________ 30 mg once a day.   DISPOSITION:  The patient is to go to a nursing home and she has a follow-up  appointment with Dr. Marye Round at outpatient clinic at [facility] on October the  26th at 4:00 P.M.   CONSULTATIONS:  1.  Daniel Nones, M.D., cardiologist on September 5.  2.  Gregg W. Lovena Le, M.D., cardiologist on September 9.  3.  Pulmonary CCM on September 6.  4.  Catherine A. Jacolyn Reedy, M.D. on September 10 in neurology.   PROCEDURES:  1.  Temporary intravenous pacemaker on September 5.  2.  Chest x-ray on September 4 and September 10.  3.  VQ scans September 5 that showed indeterminate __________ increased      perfusion to the left perihilar and lingular segments.  The patient's      radiograph earlier today shows increasing atelectasis in this region and      the distribution of the perfusion defect is not typical  for pulmonary      embolism.  4.  A 2D echo on September 5.  Summary:  Ejection fraction between 35-40%.  5.  CT of the head without contrast on September 5:  No evidence of acute      intracranial abnormalities.  Remote lacunar infarct of the right      caudate.  6.  MRI/MRA of the brain on September 9.  Impression:  Negative MRI of the      brain.  No evidence of acute stroke or chronic vascular disease.      Impression of the MRA:  The study is degraded by motion showing no      abnormality.  7.  Swallowing function test on September 12.  No report at this point.  8.  Electroencephalogram on September  12.  This is a mildly abnormal EEG      recorded due to mild background slowing.  Such a recording is      nonspecific and can be seen with any process resulting in a mild      metabolic or toxic encephalopathy or underlying demented illness.  No      epileptiform discharges were seen at any time.  This was a technically      difficult due to excessive head movement artifact during the initial      phases of the recording.  9.  Cardiac catheterization on September 19.   HISTORY OF PRESENT ILLNESS:  This is a 60 year old African-American woman  with a history of insulin-requiring diabetes and status post Dacron  angioplasty of distal stenosis of left temporal-popliteal bypass on May 21, 2004 who was at her house, when according to her sister (witness), had a  sudden shortness of breath, fell back on her bed and became unconscious and  hypopneic, going later into cardiopulmonary arrest.  Relatives did some CPR  and called 911.  EMS arrived and they did not find a pulse at the beginning,  but after they put the monitor, they found that the patient had electrical  activity.  EMS put an external pacemaker in her and when she arrived to the  emergency room the patient already had a pulse, but she was very unstable  and very bradycardic.  At the emergency room, the patient required  several  doses of adrenal to increase the heart rate and maintain the patient stable.   ADMISSION LABORATORY DATA:  ABG:  A pH 7.29, pCO2 43, pO2 47, bicarb 21.  Sodium 141, potassium 3.1, chloride 104, BUN 25, creatinine 2.1, glucose  347, anion gap 16.  CBC:  Hemoglobin 8.1, hematocrit 23.7, MCV 91.8, white  blood cell count 22, platelet 285.  Absolute neutrocyte count 5.6.  Urinalysis:  Nitrate negative, leukocyte negative, bacteria few, white blood  cells 3-6, red blood cells 0-2, protein more than 300.  PT 12.8, INR 1.0,  PTT 27.   HOSPITAL COURSE:  Altered mental status.  The patient was post cardiac  arrest patient that came in unresponsive with no reflexes, but no signs of  decortication.  She remained this way for 48 hours after which she started  to open her eyes, withdrawal to pain and after that follows simple commands.  After she was extubated, the patient remained confused on time, person and  place.  During her hospitalization, she was able to be oriented in place,  partially in person, but not in time.  A neurological evaluation was done by  Dr. Marney Setting and her impression was hypoxic ischemic encephalopathy  with moderate to severe cognitive impairment which is not present at  baseline according to her friend; however, no focal, motor or cranial nerve  deficit.  She is now five days out from the out of hospital cardiac arrest.  It is hard to predict what rate or degree of cognitive recovery to expect at  this stage.  We do need to rule out seizure activity ongoing.   RECOMMENDATIONS:  1.  EEG and I recommend NPO until speech therapy can evaluate for swallowing      safety.  EEG, MRI and swallow evaluations were done.  As already said,      the results of the EEG and MRI are within the procedures and at this      point we do not  have the swallow evaluation, but the patient during her     hospitalization was able to eat by mouth with a diet of dysphagia to       __________ thin liquids.  The patient during her hospitalization      continues to improve following more commands and two to three days      previous to her discharge she was able to walk with assistance and have      conversation with the team and with her relatives even though at the      moment of her discharge she was still disoriented on time.  2.  Status post cardiac arrest.  This is a patient that came in with intense      bradycardia plus cardiac arrest that required several doses of      epinephrine at the ED.  Then an IV pacemaker was needed to maintain      adequate heart rate.  The pacemaker was in place for 24 hours, after      which the patient maintained sinus rhythm and did not present any events      on telemetry and maintained a normal sinus rhythm.  Due to the cardiac      arrest and altered mental status, the patient remained on ventilator for      four days being extubated successfully on September 8 after a trial of      weaning protocol once.  After that, the patient remained stable and did      not present any respiratory problems during the rest of the course of      her hospitalization.  Cardiology was consulted and Dr. Lovena Le evaluated      this patient and his impression was 1) Cardiopulmonary arrest in the      setting of early __________ peripheral vascular surgery. 2) Peripheral      vascular disease. 3) LV dysfunction by 2D echocardiogram. 4) Diabetes.      5) Hypertension.   DISCUSSION:  The etiology of the patient's arrest is unclear.  It was  definitely sudden and without warning.  We do not have any strip, but  apparently she was quite bradycardic, though it is unclear whether this  represents sinus bradycardia versus sinus rhythm with complete heart block  and no ventricular escape.  There was no overt significant conduction system  disease.  The patient did not have any evidence of an acute transmural  myocardial infarction by cardiac enzymes.  Her  heart rhythm has now returned  to normal and her mental status has markedly improved.  Initially following  her arrest, she was quite encephalopathic.  She is now alert, although she  is disoriented.   RECOMMENDATIONS:  The recommendation at this time would be to proceed with  left heart catheterization and follow her clinically.  After that she had a  cardiac catheterization that showed the following:  1.  Preserved overall left ventricular systolic function.  2.  No critical coronary obstruction at the present time.  For that reason,      the patient was discharged on the medications I already mentioned and      she is to follow up at our outpatient clinic at [facility].  3.  Hypertension.  At admission, the patient was very unstable with      hypotension, but after she was stabilized with the intravenous pacemaker      and the ventilator, the patient remained stable for the  first 48 hours     to 72 hours and after that when the patient was out of ventilator, she      started to elevate her blood pressure so different medications were      added to maintain the patient on a normal blood pressure, but after we      found the perfect regimen of medication for her to remain stable and she      is discharged on those oral medications.  4.  Diabetes.  The patient has been well controlled on Lantus insulin plus      sliding scale insulin.  She is discharged on those medications and to      maintain strict control of her glycemia.  5.  Leukocytosis.  The patient came in with leukocytosis.  No sources of      infection were found neither clinically nor with blood cultures or urine      cultures so we did not treat her with antibiotics and the patient      remained afebrile during the whole hospitalization.  All studies were      possible sources of leukocytosis including malignancies came back      negative so no further treatment is required at this point.   LABORATORY DATA AT DISCHARGE:   CBC:  White blood cell count 13.5, hemoglobin  12.1, hematocrit 35.8, platelets 256, MCV 19.1.  Basic metabolic panel:  Sodium 0000000, potassium 4.3, chloride 108, CO2 22, glucose 97, BUN 31,  creatinine 1.7, calcium 9.6.   SPECIAL INSTRUCTIONS:  Diet is dysphagia II plus thin liquids and full  supervision with p.o.  The patient also needs PT/OT.  The patient will need  a metabolic panel tomorrow or Thursday to follow her creatinine and BUN.       YC/MEDQ  D:  09/26/2004  T:  09/26/2004  Job:  [id number]

## 2011-04-24 ENCOUNTER — Other Ambulatory Visit: Payer: Self-pay | Admitting: Internal Medicine

## 2011-05-02 IMAGING — CR DG CHEST 1V PORT
1 series · 1 of 1 positions shown · non-contrast
Comparison: 02/22/2010

CLINICAL DATA: Fever and dehydration

PORTABLE CHEST - 1 VIEW

[view not recorded]
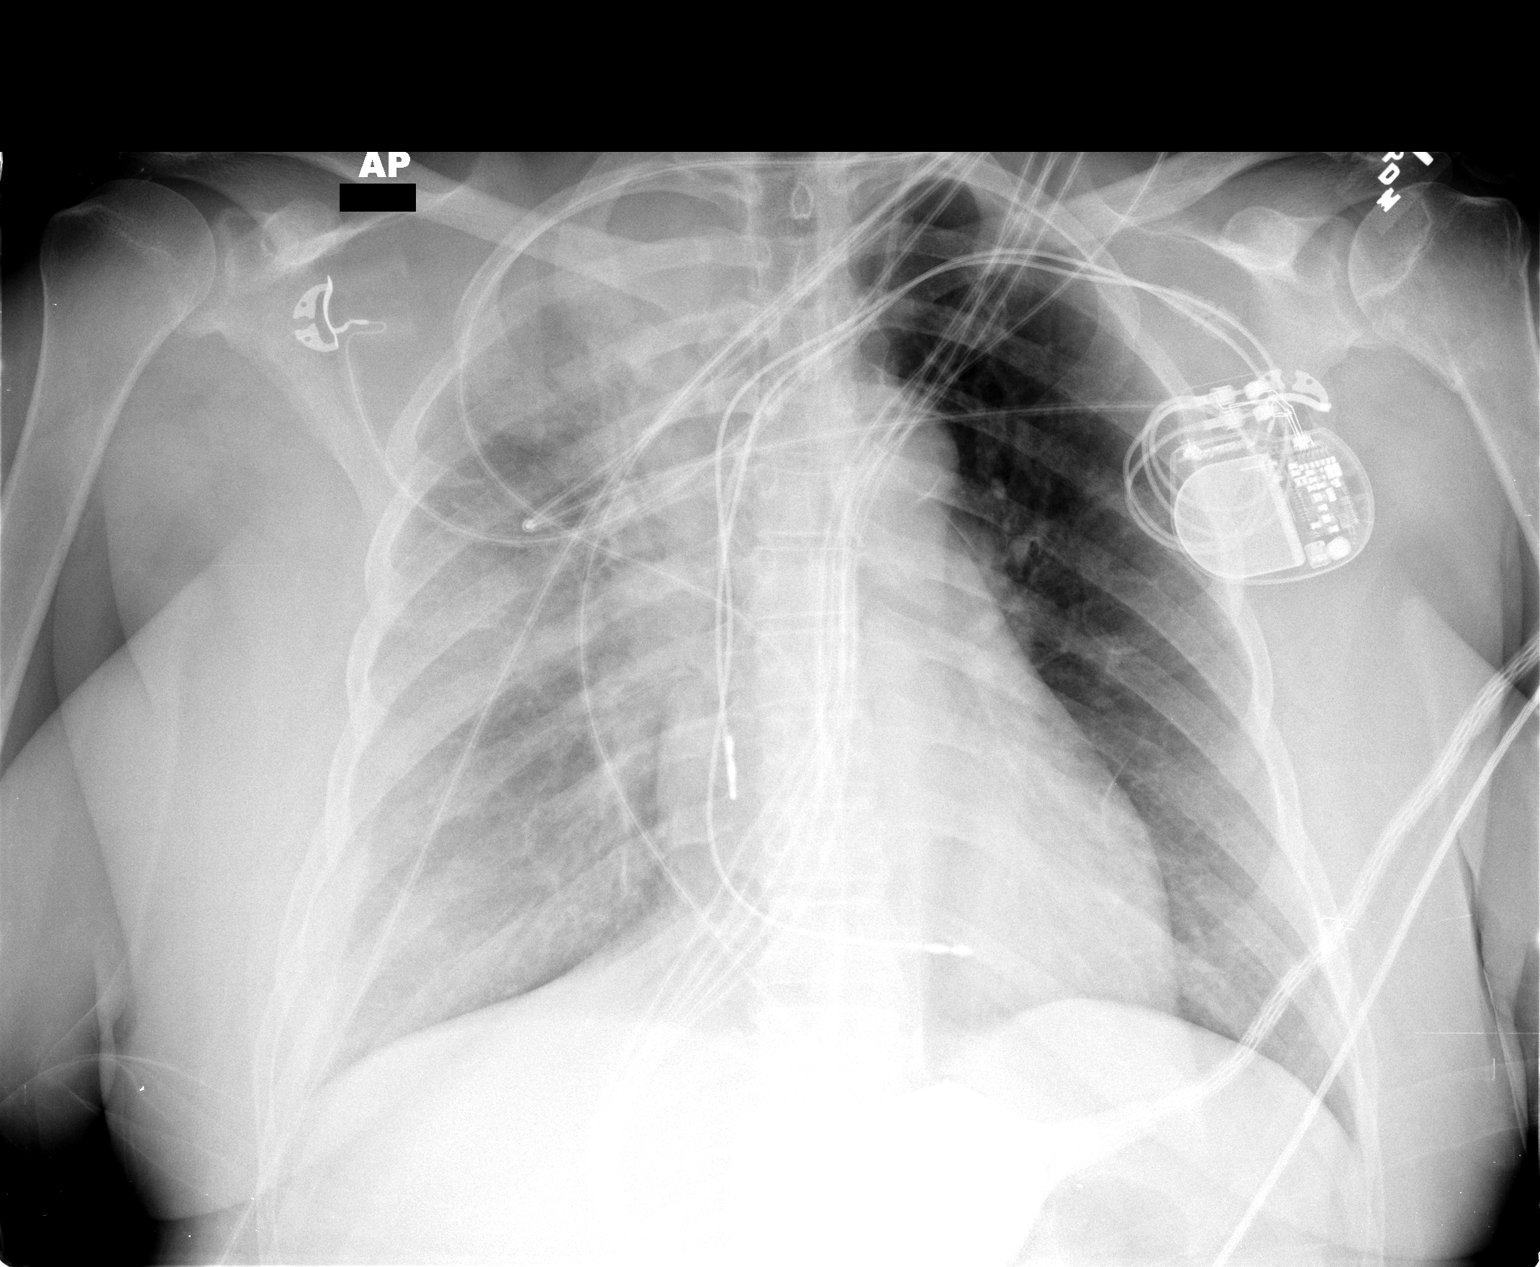

[1 of 1 positions shown; findings below may reference images not displayed]

FINDINGS: Normal heart size.  Right upper lobe consolidation with
patchy airspace opacities throughout the remainder of the right
lung are improved.  Left lung is clear.  No pneumothorax.  Left
internal jugular center venous catheter is unchanged.  Dual lead
left subclavian pacemaker is stable.
IMPRESSION: Improving pneumonia within the right lung.

## 2011-05-06 ENCOUNTER — Telehealth: Payer: Self-pay | Admitting: *Deleted

## 2011-05-06 NOTE — Telephone Encounter (Signed)
Pt calls and states she has been checking her bp at the drug store and it is too low, i ask what the readings have been and she states 79/70, i think she must have mixed the numbers up??? She request to see a dr and appt is set for 8/9 at 1015 since last visit was may and she was to f/u 1-2 months.

## 2011-05-06 NOTE — Telephone Encounter (Signed)
Agree with scheduling a follow-up appointment.  The numbers are suspicious for a false reading given the pulse pressure of 7.  I also suspect she is asymptomatic since she found this while regularly checking her blood pressure.  That being said, the appointment is appropriate because she is on at least 4 medications that could result in hypotension and has a history of CAD and conduction system disease (s/p pacemaker).  When we see her will check blood pressure, orthostatics, and assess for symptoms.

## 2011-05-08 ENCOUNTER — Ambulatory Visit (HOSPITAL_COMMUNITY)
Admission: RE | Admit: 2011-05-08 | Discharge: 2011-05-08 | Disposition: A | Discharge status: Home / Self Care | Payer: Medicaid Other | Source: Ambulatory Visit | Attending: Internal Medicine | Admitting: Internal Medicine

## 2011-05-08 ENCOUNTER — Encounter: Payer: Self-pay | Admitting: Internal Medicine

## 2011-05-08 ENCOUNTER — Ambulatory Visit (INDEPENDENT_AMBULATORY_CARE_PROVIDER_SITE_OTHER): Payer: Medicaid Other | Admitting: Internal Medicine

## 2011-05-08 VITALS — BP 129/78 | HR 66 | Temp 97.0°F | Ht 62.0 in | Wt 156.8 lb

## 2011-05-08 DIAGNOSIS — Z950 Presence of cardiac pacemaker: Secondary | ICD-10-CM | POA: Insufficient documentation

## 2011-05-08 DIAGNOSIS — I10 Essential (primary) hypertension: Secondary | ICD-10-CM

## 2011-05-08 DIAGNOSIS — R42 Dizziness and giddiness: Secondary | ICD-10-CM | POA: Insufficient documentation

## 2011-05-08 DIAGNOSIS — I447 Left bundle-branch block, unspecified: Secondary | ICD-10-CM | POA: Insufficient documentation

## 2011-05-08 DIAGNOSIS — E109 Type 1 diabetes mellitus without complications: Secondary | ICD-10-CM

## 2011-05-08 MED ORDER — DIPHENOXYLATE-ATROPINE 2.5-0.025 MG/5ML PO LIQD: 10.0000 mL | Freq: Four times a day (QID) | ORAL | Status: DC | PRN

## 2011-05-08 NOTE — Patient Instructions (Addendum)
   Please follow-up at the clinic in 1 month with your PCP, at which time we will reevaluate your diabetes, blood pressure, and dizziness.  STOP your lasix.  Follow-up with your cardiologist within the next 3-4 weeks.   If you have been started on new medication(s), and you develop throat closing, tongue swelling, rash, please stop the medication and call the clinic at [phone] and go to the ER.  If you are diabetic, please bring your meter to your next visit.  If symptoms worsen, or new symptoms arise, please call the clinic or go to the ER.  Please bring all of your medications in a bag to your next visit.
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
## 2011-05-08 NOTE — Assessment & Plan Note (Signed)
BP Readings from Last 3 Encounters:  05/08/11 129/78  02/07/11 116/74  12/25/10 [id number]    Basic Metabolic Panel:    Component Value Date/Time   NA 143 11/13/2010 1013   K 4.5 11/13/2010 1013   CL 110 11/13/2010 1013   CO2 25 11/13/2010 1013   BUN 25* 11/13/2010 1013   CREATININE 1.90* 11/13/2010 1013   CREATININE 1.68* 05/14/2010 1747   GLUCOSE 177* 11/13/2010 1013   CALCIUM 8.8 11/13/2010 1013   CALCIUM 9.3 11/06/2010 1045    Assessment: Hypertension control:   controlled  Progress toward goals:   at goal Barriers to meeting goals:  no barriers identified  Plan: Hypertension treatment:   - continue current medications - DC lasix due to orthostasis and possible contribution.

## 2011-05-08 NOTE — Assessment & Plan Note (Signed)
Lab Results  Component Value Date   HGBA1C 5.6 02/07/2011   HGBA1C 6.0 11/06/2010   HGBA1C 5.9 07/24/2010   Lab Results  Component Value Date   MICROALBUR 73.45* 03/18/2010   LDLCALC 111* 11/06/2010   CREATININE 1.90* 11/13/2010    Assessment: Diabetes control:   controlled Progress toward goals:  at goal Barriers to meeting goals:  no barriers identified and is likely not on Metformin or some sulfonylureas due to renal insufficiency.  Plan: Diabetes treatment:    - continue current medications - During follow-up with her PCP, they can discuss if the patient needs to be continued on such tight blood sugar control - last several A1c's have been less than 6. This may not be providing her much mortality benefit, and as she is having dizziness, she may actually be experiencing hypoglycemia (that is unrecorded because potentially could be having it overnight) - reminded to bring blood glucose meter & log to each visit - The patient was informed that there may be adjustments made to her insulin regimen at the next visit.  - She is recommended to check her blood sugars if she is becoming symptomatic.

## 2011-05-08 NOTE — Assessment & Plan Note (Signed)
The patient was mildly orthostatic per vital signs, this may be done contributed by persistent mild  volume depletion due to her ongoing diarrhea (she has run out of her Lomotil), ongoing use of Lasix, and possible exacerbated by the recent very hot weather. There are no carotid bruits appreciated. She did have recent interrogation of her pacemaker and March 2012, at which was determined to be working appropriately at that time. However, her symptoms are also somewhat consistent with those experienced prior to initial episode requiring placement of the pacemaker. EKG was performed in the clinic today, which shows both atrial and ventricular pacing. Lastly, patient may be experiencing hypoglycemia as her blood sugar control is very tightly manage at this time with the last A1c of 5.6. It is possible that she is experiencing hypoglycemia, which she does happen to capture on her once daily glucose checks. - Will DC Lasix - EKG performed in clinic today-interpreted by myself and Dr. Eppie Gibson - Will refill Lomotil - She counseled to ensure that she is keeping up with her fluid loss due to her chronic diarrhea - Recommended to followup with her cardiologist in 3-4 weeks for further evaluation of her current cardiac status and pacemaker. - Diabetes management as per diabetes plan noted.

## 2011-05-27 ENCOUNTER — Other Ambulatory Visit: Payer: Self-pay | Admitting: Internal Medicine

## 2011-06-10 ENCOUNTER — Ambulatory Visit (INDEPENDENT_AMBULATORY_CARE_PROVIDER_SITE_OTHER): Payer: Medicaid Other | Admitting: Internal Medicine

## 2011-06-10 ENCOUNTER — Encounter: Payer: Self-pay | Admitting: Internal Medicine

## 2011-06-10 VITALS — BP 140/70 | HR 60 | Temp 97.8°F | Ht 62.0 in | Wt 160.9 lb

## 2011-06-10 DIAGNOSIS — R197 Diarrhea, unspecified: Secondary | ICD-10-CM

## 2011-06-10 DIAGNOSIS — D649 Anemia, unspecified: Secondary | ICD-10-CM

## 2011-06-10 DIAGNOSIS — F17200 Nicotine dependence, unspecified, uncomplicated: Secondary | ICD-10-CM

## 2011-06-10 DIAGNOSIS — N183 Chronic kidney disease, stage 3 unspecified: Secondary | ICD-10-CM

## 2011-06-10 DIAGNOSIS — R42 Dizziness and giddiness: Secondary | ICD-10-CM

## 2011-06-10 DIAGNOSIS — E785 Hyperlipidemia, unspecified: Secondary | ICD-10-CM

## 2011-06-10 DIAGNOSIS — E109 Type 1 diabetes mellitus without complications: Secondary | ICD-10-CM

## 2011-06-10 DIAGNOSIS — L97509 Non-pressure chronic ulcer of other part of unspecified foot with unspecified severity: Secondary | ICD-10-CM

## 2011-06-10 DIAGNOSIS — I10 Essential (primary) hypertension: Secondary | ICD-10-CM

## 2011-06-10 DIAGNOSIS — Z23 Encounter for immunization: Secondary | ICD-10-CM

## 2011-06-10 LAB — GLUCOSE, CAPILLARY: Glucose-Capillary: 66 mg/dL — ABNORMAL LOW (ref 70–99)

## 2011-06-10 MED ORDER — DIPHENOXYLATE-ATROPINE 2.5-0.025 MG/5ML PO LIQD: 10.0000 mL | Freq: Four times a day (QID) | ORAL | Status: DC | PRN

## 2011-06-10 MED ORDER — LISINOPRIL 10 MG PO TABS: 20.0000 mg | ORAL_TABLET | Freq: Every day | ORAL | Status: DC

## 2011-06-10 NOTE — Patient Instructions (Signed)
-  Please return in 1-2 months unless your dizziness worsens  -Please stop carvedilol and increase your Lisinopril to 20mg  daily.  You can take 2 Lisinopril tablets a day.  -Please return for blood work at least 1 week prior to your next appointment.  Please remain fasting for this blood draw.    -Please consider quitting smoking.

## 2011-06-10 NOTE — Assessment & Plan Note (Addendum)
Notes that dizziness persists.  Patient is orthostatic.  Unlikely 2/2 hypoglycemia given meter readings and symptoms are not suggestive of hypoglycemia as she denies characteristic symptoms such as palpitations.  She endorses hot flashes, but notes that has been chronic after total hysterectomy at 60 years old.  May be 2/2 chronic anemia that may be exacerbated by fluid depletion in the heat and 2/2 chronic diarrhea.  Plan: Recheck CBC, if anemia persists or is worse, will need to follow up with further anemia studies (last CBC was in 2011) Refill Lomotil

## 2011-06-10 NOTE — Assessment & Plan Note (Signed)
Patient's last lipid panal was in February 2012, and her LDL was above goal.  Given that she has been at goal in the past, she needs to have a repeat lipid panal as it has been over 6 months since the last blood draw, and simvastatin was decreased to 20mg  after starting amlodipine given interaction with 40mg  simvastatin.  If her LDL is still >100, we may consider switching her to another statin.  Plan: Repeat fasting lipid panal prior to next visit.

## 2011-06-10 NOTE — Assessment & Plan Note (Signed)
Patient will need to have repeat blood work for evaluation of progression of CKD.  Pending results, she may require calcium and vit D supplementation and a phosphate binder.  She is currently written for high dose vitamin D, but is not taking it as it is expensive.    Plan: CMET for BUN/Cr/Albumin/Ca, Vit D level, Phosphate level

## 2011-06-10 NOTE — Progress Notes (Signed)
Called to pharm 

## 2011-06-10 NOTE — Assessment & Plan Note (Signed)
DM is well controlled at this time.  Her A1c has remained <6, which suggests tight blood sugar control.  Upon reviewing patient's meter, it does not appear she has had any episodes of hypoglycemia.  She does not take lantus or SSI daily, but only when she needs it as per glucometer readings.  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  Given her history of DM and subsequent complications as per chart review such as nephropathy, neuropathy & retinopathy, I will hold off on making adjustments to her insulin regimen until I can further discuss her DM history with her.  Her symptom of dizziness does not seem to correlate with low blood sugar.  She is being seen by a podiatrist regarding a left foot ulcer, and has an appointment tomorrow.  She currently wears a boot, which she has had for over a year, and is being treated with a 2 week course of Augmentin & 1 week course of ciprofloxacin, though based on the date of prescription, both courses should have been completed by now, and bottles still have over half of medications left.  She notes she is in fact taking medications, so she likely started her course days after having the medications filled.    She has already had an eye exam this year.  Today she was also given both the pneumovax & flu vaccines.  She is not interested in smoking cessation, but was counseled to quit.  Plan: Before her next appointment, she should return to clinic to have a fasting lipid panal drawn and urine microalbumin.   Continue lantus and SSI for now She was instructed to bring glucometer with her at every appointment.

## 2011-06-10 NOTE — Assessment & Plan Note (Addendum)
Left foot ulcer, being followed by podiatrist, and has been wearing a boot x 1 year.  Has appointment tomorrow.  Is currently on antibiotics for ulcer.  She noted that she was initially given Augmentin on 05/20/11 for 2 weeks, which she did not initially take and then after culture results were reviewed, she was started on cipro x 1 week.  She is still completing her antibiotic course.

## 2011-06-10 NOTE — Assessment & Plan Note (Signed)
Patient's BP was elevated at today's visit, but she had not yet taken her medications for the day.  She was orthostatic, so for now, I asked her to stop taking carvedilol and increase Lisinopril to 20mg  daily, given that lisinopril has less of an autonomic impact.  I told her she can take 2 tablets as her current prescription is Lisinopril 10mg .    Plan: Conitinue Amlodipine & imdur, increase lisinopril and discontinue carvedilol.

## 2011-06-10 NOTE — Assessment & Plan Note (Signed)
May be 2/2 CKD, but will need to re-evaluate given recent c/o dizziness  Plan: Re-check CBC and may need subsequent work up

## 2011-06-10 NOTE — Assessment & Plan Note (Signed)
Patient reports smoking 1/2 pack per week.  She has decreased her use, but is not interested in quitting at this time despite recommendations from other physicians and family members.
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
## 2011-06-10 NOTE — Assessment & Plan Note (Signed)
Diarrhea persists.  Patient is out of Lomotil.  Requests refill for 1 month supply/refill.  She notes that recently the refill amount was not correct and has been having to pay a co-pay frequently.    Will re-evaluate at next appointment to try to identify etiology.  Unlikely 2/2 DM given excellent control.    Plan: Refill Lomotil

## 2011-06-11 NOTE — Progress Notes (Signed)
agree with plans and notes 

## 2011-06-20 LAB — I-STAT 8, (EC8 V) (CONVERTED LAB)
BUN: 15
Chloride: 112
pCO2, Ven: 43.5 — ABNORMAL LOW
pH, Ven: 7.322 — ABNORMAL HIGH

## 2011-06-20 LAB — POCT I-STAT CREATININE
Creatinine, Ser: 1.6 — ABNORMAL HIGH
Operator id: 288331

## 2011-06-24 ENCOUNTER — Encounter: Payer: Self-pay | Admitting: Internal Medicine

## 2011-06-24 ENCOUNTER — Ambulatory Visit (INDEPENDENT_AMBULATORY_CARE_PROVIDER_SITE_OTHER): Payer: Medicaid Other | Admitting: Internal Medicine

## 2011-06-24 DIAGNOSIS — I10 Essential (primary) hypertension: Secondary | ICD-10-CM

## 2011-06-24 DIAGNOSIS — Z950 Presence of cardiac pacemaker: Secondary | ICD-10-CM

## 2011-06-24 DIAGNOSIS — I455 Other specified heart block: Secondary | ICD-10-CM

## 2011-06-24 DIAGNOSIS — E109 Type 1 diabetes mellitus without complications: Secondary | ICD-10-CM

## 2011-06-24 LAB — PACEMAKER DEVICE OBSERVATION
AL AMPLITUDE: 2.9 mv
BAMS-0001: 160 {beats}/min
DEVICE MODEL PM: 7114075
RV LEAD AMPLITUDE: 12 mv
RV LEAD THRESHOLD: 0.625 V

## 2011-06-24 NOTE — Patient Instructions (Signed)
Your physician wants you to follow-up in: 6 months with Kristin/Paula. You will receive a reminder letter in the mail two months in advance. If you don't receive a letter, please call our office to schedule the follow-up appointment.  Your physician recommends that you continue on your current medications as directed. Please refer to the Current Medication list given to you today.

## 2011-06-24 NOTE — Assessment & Plan Note (Signed)
We reviewed the importance of following her hemoglobin A1c. It has been under 6
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
## 2011-06-24 NOTE — Assessment & Plan Note (Signed)
The patient's device was interrogated.  The information was reviewed. No changes were made in the programming.    

## 2011-06-24 NOTE — Assessment & Plan Note (Signed)
Well controlled 

## 2011-06-24 NOTE — Assessment & Plan Note (Signed)
She is atrially paced 75% of the time and ventricularly paced 75% of the time

## 2011-06-30 LAB — URINALYSIS, ROUTINE W REFLEX MICROSCOPIC
Bilirubin Urine: NEGATIVE
Ketones, ur: NEGATIVE
Nitrite: NEGATIVE
Urobilinogen, UA: 0.2
pH: 6

## 2011-06-30 LAB — CBC
HCT: 39
Hemoglobin: 12.9
MCHC: 33.1
RDW: 13.8

## 2011-06-30 LAB — DIFFERENTIAL
Basophils Relative: 0
Lymphocytes Relative: 13
Lymphs Abs: 1.4
Monocytes Relative: 2 — ABNORMAL LOW
Neutro Abs: 8.8 — ABNORMAL HIGH
Neutrophils Relative %: 83 — ABNORMAL HIGH

## 2011-06-30 LAB — COMPREHENSIVE METABOLIC PANEL
BUN: 15
Calcium: 9.1
Glucose, Bld: 156 — ABNORMAL HIGH
Total Protein: 7.4

## 2011-06-30 LAB — POCT CARDIAC MARKERS
CKMB, poc: 2.6
Myoglobin, poc: 151

## 2011-06-30 LAB — URINE MICROSCOPIC-ADD ON

## 2011-08-28 ENCOUNTER — Encounter: Payer: Self-pay | Admitting: Internal Medicine

## 2011-08-28 ENCOUNTER — Ambulatory Visit (INDEPENDENT_AMBULATORY_CARE_PROVIDER_SITE_OTHER): Payer: Medicaid Other | Admitting: Internal Medicine

## 2011-08-28 DIAGNOSIS — E109 Type 1 diabetes mellitus without complications: Secondary | ICD-10-CM

## 2011-08-28 DIAGNOSIS — R6889 Other general symptoms and signs: Secondary | ICD-10-CM

## 2011-08-28 DIAGNOSIS — R197 Diarrhea, unspecified: Secondary | ICD-10-CM

## 2011-08-28 DIAGNOSIS — I10 Essential (primary) hypertension: Secondary | ICD-10-CM

## 2011-08-28 DIAGNOSIS — E213 Hyperparathyroidism, unspecified: Secondary | ICD-10-CM

## 2011-08-28 DIAGNOSIS — E785 Hyperlipidemia, unspecified: Secondary | ICD-10-CM

## 2011-08-28 DIAGNOSIS — G44209 Tension-type headache, unspecified, not intractable: Secondary | ICD-10-CM

## 2011-08-28 DIAGNOSIS — R6883 Chills (without fever): Secondary | ICD-10-CM | POA: Insufficient documentation

## 2011-08-28 DIAGNOSIS — N183 Chronic kidney disease, stage 3 unspecified: Secondary | ICD-10-CM

## 2011-08-28 DIAGNOSIS — E211 Secondary hyperparathyroidism, not elsewhere classified: Secondary | ICD-10-CM

## 2011-08-28 DIAGNOSIS — R42 Dizziness and giddiness: Secondary | ICD-10-CM

## 2011-08-28 LAB — LIPID PANEL
Cholesterol: 165 mg/dL (ref 0–200)
Triglycerides: 232 mg/dL — ABNORMAL HIGH (ref <150)
VLDL: 46 mg/dL — ABNORMAL HIGH (ref 0–40)

## 2011-08-28 LAB — COMPREHENSIVE METABOLIC PANEL WITH GFR
ALT: 12 U/L (ref 0–35)
AST: 26 U/L (ref 0–37)
Albumin: 3.9 g/dL (ref 3.5–5.2)
Alkaline Phosphatase: 99 U/L (ref 39–117)
BUN: 20 mg/dL (ref 6–23)
CO2: 20 meq/L (ref 19–32)
Calcium: 9.1 mg/dL (ref 8.4–10.5)
Chloride: 111 meq/L (ref 96–112)
Creat: 1.98 mg/dL — ABNORMAL HIGH (ref 0.50–1.10)
Glucose, Bld: 129 mg/dL — ABNORMAL HIGH (ref 70–99)
Potassium: 4.5 meq/L (ref 3.5–5.3)
Sodium: 143 meq/L (ref 135–145)
Total Bilirubin: 0.5 mg/dL (ref 0.3–1.2)
Total Protein: 7.2 g/dL (ref 6.0–8.3)

## 2011-08-28 LAB — CBC
MCV: 92.7 fL (ref 78.0–100.0)
Platelets: 134 10*3/uL — ABNORMAL LOW (ref 150–400)
RDW: 14.2 % (ref 11.5–15.5)
WBC: 7.3 10*3/uL (ref 4.0–10.5)

## 2011-08-28 LAB — PHOSPHORUS: Phosphorus: 4.1 mg/dL (ref 2.3–4.6)

## 2011-08-28 LAB — MAGNESIUM: Magnesium: 1.9 mg/dL (ref 1.5–2.5)

## 2011-08-28 LAB — GLUCOSE, CAPILLARY: Glucose-Capillary: 111 mg/dL — ABNORMAL HIGH (ref 70–99)

## 2011-08-28 LAB — T4, FREE: Free T4: 1.06 ng/dL (ref 0.80–1.80)

## 2011-08-28 NOTE — Assessment & Plan Note (Signed)
Its frequent episodes of body chills which would last approximately 20 minutes. She reports this has been occurring for the last 2-4 weeks. Not associated with fever, illness, or cold-like symptoms. TSH and CBC today given patient has history of anemia.

## 2011-08-28 NOTE — Assessment & Plan Note (Signed)
Reports continued episodes of tension headache on occasion. Currently not taking any analgesics.

## 2011-08-28 NOTE — Patient Instructions (Signed)
We will test your hormone levels in your blood today.  Please follow-up with Dr. Victorio Palm within 1-2 weeks.

## 2011-08-28 NOTE — Assessment & Plan Note (Signed)
Is resolution of dizziness. She also reports that she did not discontinue the carvedilol and increased the lisinopril to 20 mg as advised by her PCP. Currently she is continue to take the carvedilol and lisinopril at 10 mg daily.

## 2011-08-28 NOTE — Progress Notes (Signed)
  Subjective:    Patient ID: [name] [name], female    DOB: Feb 05, 1951, 60 y.o.   MRN: [id number]  HPI Pt reports past 2 weeks-1 month experiencing chills like a cold breeze going through her body which last about 20 minutes then resolve.  Reporting dry skin , change in bowel habits, dizziness, shortness of breath, chest pain, or cold-like symptoms. Reportsoccasional dry cough. Significant for insulin-dependent diabetes, hypertension, normocytic anemia and secondary hyperparathyroidism.   Review of Systems  Constitutional: Positive for chills and fatigue. Negative for fever, diaphoresis and appetite change.  HENT: Negative for rhinorrhea and sneezing.   Eyes: Negative.   Respiratory: Positive for cough. Negative for shortness of breath.   Cardiovascular: Negative for chest pain.  Genitourinary: Positive for hematuria. Negative for dysuria.  Musculoskeletal: Positive for back pain and arthralgias. Negative for gait problem.  Neurological: Positive for dizziness and headaches. Negative for syncope.  Hematological: Negative.   Psychiatric/Behavioral: Negative.        Objective:   Physical Exam  Constitutional: She is oriented to person, place, and time. She appears well-developed and well-nourished. No distress.  HENT:  Head: Normocephalic and atraumatic.  Eyes: Conjunctivae and EOM are normal. Pupils are equal, round, and reactive to light.  Neck: Normal range of motion. Neck supple. No thyromegaly present.  Cardiovascular: Normal rate, regular rhythm and normal heart sounds.   Pulmonary/Chest: Effort normal and breath sounds normal.  Abdominal: Soft. Bowel sounds are normal.  Neurological: She is alert and oriented to person, place, and time.  Skin: Skin is warm and dry.       Ashen skin throughout.  Psychiatric: She has a normal mood and affect. Her behavior is normal. Judgment and thought content normal.          Assessment & Plan:

## 2011-08-28 NOTE — Assessment & Plan Note (Signed)
Get lipid panel checked today which was ordered at last visit with PCP in September 2012.

## 2011-08-28 NOTE — Assessment & Plan Note (Addendum)
Diabetes under good control with glucometer readings 92% within target goal. The glucose today 111. Patient reports using 10 units of Lantus as needed and sliding-scale insulin.Will continue current therapy.

## 2011-08-28 NOTE — Assessment & Plan Note (Signed)
Will release order for intact PTH which was ordered by PCP at September 2012 visit.

## 2011-08-30 LAB — VITAMIN D 1,25 DIHYDROXY
Vitamin D2 1, 25 (OH)2: 48 pg/mL
Vitamin D3 1, 25 (OH)2: <8 pg/mL

## 2011-09-10 ENCOUNTER — Other Ambulatory Visit: Payer: Self-pay | Admitting: Internal Medicine

## 2011-09-11 ENCOUNTER — Ambulatory Visit (INDEPENDENT_AMBULATORY_CARE_PROVIDER_SITE_OTHER): Payer: Medicaid Other | Admitting: Internal Medicine

## 2011-09-11 ENCOUNTER — Encounter: Payer: Self-pay | Admitting: Internal Medicine

## 2011-09-11 VITALS — BP 145/74 | HR 81 | Temp 98.3°F | Ht 63.0 in | Wt 158.5 lb

## 2011-09-11 DIAGNOSIS — E1149 Type 2 diabetes mellitus with other diabetic neurological complication: Secondary | ICD-10-CM

## 2011-09-11 DIAGNOSIS — I10 Essential (primary) hypertension: Secondary | ICD-10-CM

## 2011-09-11 DIAGNOSIS — E109 Type 1 diabetes mellitus without complications: Secondary | ICD-10-CM

## 2011-09-11 LAB — GLUCOSE, CAPILLARY: Glucose-Capillary: 111 mg/dL — ABNORMAL HIGH (ref 70–99)

## 2011-09-11 LAB — POCT GLYCOSYLATED HEMOGLOBIN (HGB A1C): Hemoglobin A1C: 5.3

## 2011-09-11 MED ORDER — GABAPENTIN 300 MG PO CAPS: 300.0000 mg | ORAL_CAPSULE | Freq: Three times a day (TID) | ORAL | Status: DC

## 2011-09-11 MED ORDER — INSULIN GLARGINE 100 UNIT/ML ~~LOC~~ SOLN: 5.0000 [IU] | Freq: Every day | SUBCUTANEOUS | Status: DC

## 2011-09-11 NOTE — Progress Notes (Signed)
Subjective:     Patient ID: [name] [name], female   DOB: 1951/07/26, 60 y.o.   MRN: [id number]  HPI  Patient is a 60 year old female with a past medical history listed below, presents to the outpatient clinic for routine diabetic followup, denies any complaints, and would like refills of some of her medications.   Patient Active Problem List  Diagnoses  . IDDM  . DIABETIC  RETINOPATHY  . DIABETIC PERIPHERAL NEUROPATHY  . HYPERPARATHYROIDISM, SECONDARY  . HYPERLIPIDEMIA  . ANEMIA, NORMOCYTIC  . TOBACCO USER  . CARPAL TUNNEL SYNDROME  . HYPERTENSION  . PERIPHERAL VASCULAR DISEASE  . CHRONIC KIDNEY DISEASE STAGE III (MODERATE)  . FOOT ULCER  . PACEMAKER, PERMANENT  . Sinus arrest  . Tension headache  . Dizziness  . Chronic Diarrhea  . Chills (without fever)   Current Outpatient Prescriptions on File Prior to Visit  Medication Sig Dispense Refill  . amLODipine (NORVASC) 10 MG tablet Take 1 tablet (10 mg total) by mouth daily.  30 tablet  6  . aspirin 81 MG chewable tablet Chew 81 mg by mouth daily.        . Blood Glucose Monitoring Suppl (ACCU-CHEK AVIVA PLUS) W/DEVICE KIT 1 kit by Does not apply route 3 (three) times daily before meals. Use to check blood sugar three times daily before meals.  1 kit  0  . carvedilol (COREG) 6.25 MG tablet Take 6.25 mg by mouth 2 (two) times daily with a meal.        . diphenoxylate-atropine (LOMOTIL) 2.5-0.025 MG/5ML liquid Take 10 mLs by mouth 4 (four) times daily as needed.  600 mL  3  . glucose blood (ACCU-CHEK AVIVA PLUS) test strip Use to check blood sugar three times daily before meals.  100 each  12  . insulin regular (HUMULIN R,NOVOLIN R) 100 UNIT/ML injection Every morning per sliding scale      . Insulin Syringe-Needle U-100 31G X 5/16" 0.3 ML MISC Use as directed to take insulin  100 each  0  . isosorbide mononitrate (IMDUR) 30 MG 24 hr tablet TAKE ONE (1) TABLET(S) BY MOUTH ONCE DAILY  30 tablet  0  . Lancets (ACCU-CHEK MULTICLIX)  lancets Use to check blood sugar three times daily before meals.  100 each  12  . lisinopril (PRINIVIL,ZESTRIL) 10 MG tablet Take 2 tablets (20 mg total) by mouth daily.  30 tablet  11  . simvastatin (ZOCOR) 20 MG tablet Take 0.5 tablets (10 mg total) by mouth at bedtime.  15 tablet  11  . travoprost, benzalkonium, (TRAVATAN) 0.004 % ophthalmic solution Place 1 drop into both eyes at bedtime.        . Vitamin D, Ergocalciferol, (DRISDOL) 50000 UNITS CAPS TAKE 1 CAPSULE WEEKLY(SAME DAY EA WEEK)  8 capsule  1   Allergies  Allergen Reactions  . Codeine   . Hydrocodone     itching     Review of Systems  All other systems reviewed and are negative.       Objective:   Physical Exam  Nursing note and vitals reviewed. Constitutional: She is oriented to person, place, and time. She appears well-developed and well-nourished.  HENT:  Head: Normocephalic and atraumatic.  Eyes: Pupils are equal, round, and reactive to light.  Neck: Normal range of motion. Neck supple. No JVD present. No thyromegaly present.  Cardiovascular: Normal rate, regular rhythm and normal heart sounds.   No murmur heard. Pulmonary/Chest: Effort normal and breath sounds normal. She has  no wheezes. She has no rales.  Abdominal: Soft. Bowel sounds are normal.  Musculoskeletal: Normal range of motion. She exhibits no edema.  Neurological: She is alert and oriented to person, place, and time.  Skin: Skin is warm and dry.

## 2011-09-11 NOTE — Assessment & Plan Note (Signed)
Well-controlled, prescription for gabapentin refill today

## 2011-09-11 NOTE — Assessment & Plan Note (Signed)
Well controlled on current treatment, No new changes made today, Will continue to monitor.   

## 2011-09-11 NOTE — Patient Instructions (Addendum)
Please take your medications as instructed. Reduce your lantus to 5 units at bedtime.

## 2011-09-11 NOTE — Assessment & Plan Note (Signed)
Given her low A1c, will decrease her nighttime Lantus to 5 mg, and continue sliding scales as per current chart, recheck A1c in 3 months. Patient is up-to-date on her diabetic health maintenance

## 2011-09-15 ENCOUNTER — Other Ambulatory Visit: Payer: Self-pay | Admitting: Internal Medicine

## 2011-09-17 ENCOUNTER — Other Ambulatory Visit: Payer: Self-pay | Admitting: Internal Medicine

## 2011-09-26 ENCOUNTER — Inpatient Hospital Stay (HOSPITAL_COMMUNITY)
Admission: EM | Admit: 2011-09-26 | Discharge: 2011-09-30 | DRG: 394 | Disposition: A | Discharge status: Home / Self Care | Payer: Medicaid Other | Attending: Internal Medicine | Admitting: Internal Medicine

## 2011-09-26 ENCOUNTER — Encounter (HOSPITAL_COMMUNITY): Payer: Self-pay | Admitting: Emergency Medicine

## 2011-09-26 ENCOUNTER — Emergency Department (HOSPITAL_COMMUNITY): Payer: Medicaid Other

## 2011-09-26 DIAGNOSIS — Z950 Presence of cardiac pacemaker: Secondary | ICD-10-CM | POA: Insufficient documentation

## 2011-09-26 DIAGNOSIS — K625 Hemorrhage of anus and rectum: Secondary | ICD-10-CM | POA: Diagnosis present

## 2011-09-26 DIAGNOSIS — I2510 Atherosclerotic heart disease of native coronary artery without angina pectoris: Secondary | ICD-10-CM | POA: Diagnosis present

## 2011-09-26 DIAGNOSIS — N183 Chronic kidney disease, stage 3 unspecified: Secondary | ICD-10-CM | POA: Diagnosis present

## 2011-09-26 DIAGNOSIS — K559 Vascular disorder of intestine, unspecified: Principal | ICD-10-CM | POA: Diagnosis present

## 2011-09-26 DIAGNOSIS — Z794 Long term (current) use of insulin: Secondary | ICD-10-CM

## 2011-09-26 DIAGNOSIS — K921 Melena: Secondary | ICD-10-CM

## 2011-09-26 DIAGNOSIS — Z8674 Personal history of sudden cardiac arrest: Secondary | ICD-10-CM

## 2011-09-26 DIAGNOSIS — E119 Type 2 diabetes mellitus without complications: Secondary | ICD-10-CM | POA: Diagnosis present

## 2011-09-26 DIAGNOSIS — F17200 Nicotine dependence, unspecified, uncomplicated: Secondary | ICD-10-CM | POA: Diagnosis present

## 2011-09-26 DIAGNOSIS — I739 Peripheral vascular disease, unspecified: Secondary | ICD-10-CM | POA: Diagnosis present

## 2011-09-26 DIAGNOSIS — M1990 Unspecified osteoarthritis, unspecified site: Secondary | ICD-10-CM | POA: Diagnosis present

## 2011-09-26 DIAGNOSIS — Z7982 Long term (current) use of aspirin: Secondary | ICD-10-CM

## 2011-09-26 DIAGNOSIS — I10 Essential (primary) hypertension: Secondary | ICD-10-CM | POA: Insufficient documentation

## 2011-09-26 DIAGNOSIS — E109 Type 1 diabetes mellitus without complications: Secondary | ICD-10-CM | POA: Insufficient documentation

## 2011-09-26 DIAGNOSIS — E785 Hyperlipidemia, unspecified: Secondary | ICD-10-CM | POA: Insufficient documentation

## 2011-09-26 DIAGNOSIS — I129 Hypertensive chronic kidney disease with stage 1 through stage 4 chronic kidney disease, or unspecified chronic kidney disease: Secondary | ICD-10-CM | POA: Diagnosis present

## 2011-09-26 DIAGNOSIS — K922 Gastrointestinal hemorrhage, unspecified: Secondary | ICD-10-CM

## 2011-09-26 HISTORY — DX: Pure hypercholesterolemia, unspecified: E78.00

## 2011-09-26 HISTORY — DX: Encounter for other specified aftercare: Z51.89

## 2011-09-26 HISTORY — DX: Gastrointestinal hemorrhage, unspecified: K92.2

## 2011-09-26 HISTORY — DX: Headache: R51

## 2011-09-26 HISTORY — DX: Anemia, unspecified: D64.9

## 2011-09-26 LAB — OCCULT BLOOD, POC DEVICE: Fecal Occult Bld: POSITIVE

## 2011-09-26 LAB — TYPE AND SCREEN
ABO/RH(D): O POS
Antibody Screen: NEGATIVE

## 2011-09-26 LAB — COMPREHENSIVE METABOLIC PANEL
ALT: 12 U/L (ref 0–35)
AST: 21 U/L (ref 0–37)
Albumin: 3.5 g/dL (ref 3.5–5.2)
Alkaline Phosphatase: 102 U/L (ref 39–117)
BUN: 26 mg/dL — ABNORMAL HIGH (ref 6–23)
CO2: 18 mEq/L — ABNORMAL LOW (ref 19–32)
Calcium: 9.7 mg/dL (ref 8.4–10.5)
Chloride: 110 mEq/L (ref 96–112)
Creatinine, Ser: 2.09 mg/dL — ABNORMAL HIGH (ref 0.50–1.10)
GFR calc Af Amer: 29 mL/min — ABNORMAL LOW (ref >90)
GFR calc non Af Amer: 25 mL/min — ABNORMAL LOW (ref >90)
Glucose, Bld: 139 mg/dL — ABNORMAL HIGH (ref 70–99)
Potassium: 4.9 mEq/L (ref 3.5–5.1)
Sodium: 140 mEq/L (ref 135–145)
Total Bilirubin: 0.4 mg/dL (ref 0.3–1.2)
Total Protein: 7.6 g/dL (ref 6.0–8.3)

## 2011-09-26 LAB — GLUCOSE, CAPILLARY
Glucose-Capillary: 64 mg/dL — ABNORMAL LOW (ref 70–99)
Glucose-Capillary: 151 mg/dL — ABNORMAL HIGH (ref 70–99)

## 2011-09-26 LAB — URINE MICROSCOPIC-ADD ON

## 2011-09-26 LAB — CBC
HCT: 38.3 % (ref 36.0–46.0)
HCT: 36.5 % (ref 36.0–46.0)
HCT: 35.7 % — ABNORMAL LOW (ref 36.0–46.0)
Hemoglobin: 12.7 g/dL (ref 12.0–15.0)
Hemoglobin: 11.9 g/dL — ABNORMAL LOW (ref 12.0–15.0)
Hemoglobin: 11.6 g/dL — ABNORMAL LOW (ref 12.0–15.0)
MCH: 30.8 pg (ref 26.0–34.0)
MCH: 29.8 pg (ref 26.0–34.0)
MCH: 30 pg (ref 26.0–34.0)
MCHC: 33.2 g/dL (ref 30.0–36.0)
MCV: 92.7 fL (ref 78.0–100.0)
Platelets: 131 10*3/uL — ABNORMAL LOW (ref 150–400)
Platelets: UNDETERMINED 10*3/uL (ref 150–400)
RBC: 4.13 MIL/uL (ref 3.87–5.11)
RBC: 3.99 MIL/uL (ref 3.87–5.11)
RBC: 3.87 MIL/uL (ref 3.87–5.11)
RDW: 14.1 % (ref 11.5–15.5)
RDW: 14.1 % (ref 11.5–15.5)
RDW: 14 % (ref 11.5–15.5)
WBC: 18.2 10*3/uL — ABNORMAL HIGH (ref 4.0–10.5)
WBC: 12.5 10*3/uL — ABNORMAL HIGH (ref 4.0–10.5)

## 2011-09-26 LAB — ABO/RH: ABO/RH(D): O POS

## 2011-09-26 LAB — URINALYSIS, ROUTINE W REFLEX MICROSCOPIC
Bilirubin Urine: NEGATIVE
Glucose, UA: NEGATIVE mg/dL
Ketones, ur: NEGATIVE mg/dL
Leukocytes, UA: NEGATIVE
Nitrite: NEGATIVE
Protein, ur: 30 mg/dL — AB
Specific Gravity, Urine: 1.013 (ref 1.005–1.030)
Urobilinogen, UA: 0.2 mg/dL (ref 0.0–1.0)
pH: 5 (ref 5.0–8.0)

## 2011-09-26 LAB — CARDIAC PANEL(CRET KIN+CKTOT+MB+TROPI)
Relative Index: INVALID (ref 0.0–2.5)
Total CK: 69 U/L (ref 7–177)
Troponin I: <0.3 ng/mL (ref <0.30)

## 2011-09-26 LAB — APTT: aPTT: 24 seconds (ref 24–37)

## 2011-09-26 LAB — PROTIME-INR: INR: 1.09 (ref 0.00–1.49)

## 2011-09-26 MED ORDER — SIMVASTATIN 40 MG PO TABS
40.0000 mg | ORAL_TABLET | Freq: Every day | ORAL | Status: DC
  Administered 2011-09-26 – 2011-09-30 (×5): 40 mg via ORAL
  Filled 2011-09-26 (×5): qty 1

## 2011-09-26 MED ORDER — SODIUM CHLORIDE 0.9 % IV SOLN
INTRAVENOUS | Status: DC
  Administered 2011-09-26 – 2011-09-27 (×4): via INTRAVENOUS
  Administered 2011-09-27: 100 mL/h via INTRAVENOUS
  Administered 2011-09-28 – 2011-09-29 (×3): via INTRAVENOUS

## 2011-09-26 MED ORDER — INSULIN ASPART 100 UNIT/ML ~~LOC~~ SOLN
0.0000 [IU] | Freq: Three times a day (TID) | SUBCUTANEOUS | Status: DC | PRN
  Filled 2011-09-26: qty 3

## 2011-09-26 MED ORDER — ONDANSETRON HCL 4 MG/2ML IJ SOLN: 4.0000 mg | Freq: Four times a day (QID) | INTRAMUSCULAR | Status: DC | PRN

## 2011-09-26 MED ORDER — VITAMIN D (ERGOCALCIFEROL) 1.25 MG (50000 UNIT) PO CAPS
50000.0000 [IU] | ORAL_CAPSULE | ORAL | Status: DC
  Administered 2011-09-26: 50000 [IU] via ORAL
  Filled 2011-09-26: qty 1

## 2011-09-26 MED ORDER — IOHEXOL 300 MG/ML  SOLN: 20.0000 mL | INTRAMUSCULAR | Status: AC

## 2011-09-26 MED ORDER — SODIUM CHLORIDE 0.9 % IV SOLN
8.0000 mg/h | INTRAVENOUS | Status: DC
  Administered 2011-09-26 – 2011-09-27 (×2): 8 mg/h via INTRAVENOUS
  Filled 2011-09-26 (×3): qty 80

## 2011-09-26 MED ORDER — INSULIN REGULAR HUMAN 100 UNIT/ML IJ SOLN
0.0000 [IU] | Freq: Three times a day (TID) | INTRAMUSCULAR | Status: DC | PRN
  Filled 2011-09-26: qty 0.02

## 2011-09-26 MED ORDER — ONDANSETRON HCL 4 MG PO TABS: 4.0000 mg | ORAL_TABLET | Freq: Four times a day (QID) | ORAL | Status: DC | PRN

## 2011-09-26 MED ORDER — GABAPENTIN 300 MG PO CAPS
300.0000 mg | ORAL_CAPSULE | Freq: Three times a day (TID) | ORAL | Status: DC
  Administered 2011-09-26 – 2011-09-30 (×11): 300 mg via ORAL
  Filled 2011-09-26 (×13): qty 1

## 2011-09-26 MED ORDER — SODIUM CHLORIDE 0.9 % IV BOLUS (SEPSIS)
500.0000 mL | INTRAVENOUS | Status: AC
  Administered 2011-09-26: 500 mL via INTRAVENOUS

## 2011-09-26 MED ORDER — MORPHINE SULFATE 2 MG/ML IJ SOLN: 2.0000 mg | INTRAMUSCULAR | Status: DC | PRN

## 2011-09-26 MED ORDER — PANTOPRAZOLE SODIUM 40 MG IV SOLR: 8.0000 mg/h | INTRAVENOUS | Status: DC

## 2011-09-26 MED ORDER — SODIUM CHLORIDE 0.9 % IV SOLN
8.0000 mg/h | INTRAVENOUS | Status: AC
  Administered 2011-09-26: 8 mg/h via INTRAVENOUS
  Filled 2011-09-26: qty 80

## 2011-09-26 MED ORDER — DEXTROSE 50 % IV SOLN
INTRAVENOUS | Status: AC
  Administered 2011-09-26: 25 mL
  Filled 2011-09-26: qty 50

## 2011-09-26 MED ORDER — TRAVOPROST 0.004 % OP SOLN
1.0000 [drp] | Freq: Every day | OPHTHALMIC | Status: DC
  Administered 2011-09-26 – 2011-09-29 (×4): 1 [drp] via OPHTHALMIC
  Filled 2011-09-26 (×4): qty 0.1

## 2011-09-26 NOTE — ED Notes (Signed)
Report given to [place], RN on 2600. Patient being transfered to 2610.
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
## 2011-09-26 NOTE — ED Notes (Signed)
Paged [facility] to [place] [phone]

## 2011-09-26 NOTE — ED Notes (Signed)
Patient returned from [place]. Placed patient on monitor and patient resting with NAD at this time. Family at bedside.

## 2011-09-26 NOTE — ED Notes (Addendum)
Patient sleeping at this time with NAD. Family at bed side. CT here to transport patient to CT.

## 2011-09-26 NOTE — ED Notes (Signed)
Pt and family updated on poc. Lab at bedside. Pt remains on monitor. Vitals stable.

## 2011-09-26 NOTE — ED Notes (Signed)
Spoke with Internal Medicine [facility]) regarding room assignment for patient. Doctor said patient needed step down unit because of bleeding and changes in her EKG.

## 2011-09-26 NOTE — ED Notes (Signed)
Patient's sister at bedside.

## 2011-09-26 NOTE — ED Notes (Signed)
Patient resting with NAD at this time. Family have gone home for a little while and will return later this afternoon.

## 2011-09-26 NOTE — ED Notes (Signed)
RN at bedside

## 2011-09-26 NOTE — ED Notes (Signed)
First meeting patient. Patient states nausea, vomiting and loose stools with bleeding started around 11:00pm last night. Patient denies any pain or sickness prior to N/V starting last night at 11:00pm.  Patients daughter brought her to the ED and patient continues to had bright red blood with mucus this morning.

## 2011-09-26 NOTE — ED Notes (Signed)
Pt with small amount of blood with clear mucus on pad, pt cleaned and new pad placed. Pt has drank CT contrast without difficulty. Pt remains on monitor. Vitals stable. Family at bedside.

## 2011-09-26 NOTE — ED Notes (Signed)
IV team paged to place large bore IV.

## 2011-09-26 NOTE — ED Notes (Signed)
Patient sleeping, IV team was able to start new IV.

## 2011-09-26 NOTE — ED Notes (Signed)
Patient resting and talking with her sister and a friend. Patient is still having small amount of bleeding and clear mucus on pad. Patient with NAD at this time.

## 2011-09-26 NOTE — ED Notes (Signed)
Pt w/3 episodes of clear mucous and bright red blood, about 1 TBS each.  No clots noted.

## 2011-09-26 NOTE — ED Notes (Signed)
Family at bedside. 

## 2011-09-26 NOTE — Progress Notes (Signed)
CBG: 64  Treatment: D50 IV 25 mL  Symptoms: None  Follow-up CBG: Time  1845         CBG Result:151  Possible Reasons for Event: Inadequate meal intake  Comments/MD notified Dr. Myriam Jacobson, Loleta Chance

## 2011-09-26 NOTE — ED Notes (Signed)
Pt to ED c/o bloody diarrhea.  Pt c/o emesis and then diarrhea, with 2 episodes of diarrhea with blood clots.    Abd tender to palpation - pt c/o burning sensation to lower abd bil.

## 2011-09-26 NOTE — ED Provider Notes (Signed)
Medical screening examination/treatment/procedure(s) were conducted as a shared visit with non-physician practitioner(s) and myself.  I personally evaluated the patient during the encounter  Lower abdominal pain, BRBPR this morning. No h/o similar. Denies weakness, dizziness, cp/sob. No fever/chills. Leukocytosis to 18K. CT unremarkable. Nursing staff working on IV access.  Admit to outpatient clinics with GI consult.  Blair Heys, MD 09/26/11 [phone]
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
## 2011-09-26 NOTE — ED Notes (Signed)
PT. REPORTS BLOODY DIARRHEA WITH VOMITTING ONSET LAST NIGHT .

## 2011-09-26 NOTE — ED Notes (Signed)
Admitting at bedside 

## 2011-09-26 NOTE — ED Notes (Signed)
Patient received contract from CT.

## 2011-09-27 DIAGNOSIS — K922 Gastrointestinal hemorrhage, unspecified: Secondary | ICD-10-CM

## 2011-09-27 DIAGNOSIS — K921 Melena: Secondary | ICD-10-CM

## 2011-09-27 LAB — GLUCOSE, CAPILLARY
Glucose-Capillary: 83 mg/dL (ref 70–99)
Glucose-Capillary: 70 mg/dL (ref 70–99)
Glucose-Capillary: 99 mg/dL (ref 70–99)

## 2011-09-27 LAB — CBC
HCT: 33.3 % — ABNORMAL LOW (ref 36.0–46.0)
HCT: 33.2 % — ABNORMAL LOW (ref 36.0–46.0)
HCT: 34.6 % — ABNORMAL LOW (ref 36.0–46.0)
HCT: 32.3 % — ABNORMAL LOW (ref 36.0–46.0)
Hemoglobin: 10.9 g/dL — ABNORMAL LOW (ref 12.0–15.0)
Hemoglobin: 11.3 g/dL — ABNORMAL LOW (ref 12.0–15.0)
MCH: 30.2 pg (ref 26.0–34.0)
MCH: 30 pg (ref 26.0–34.0)
MCH: 30.2 pg (ref 26.0–34.0)
MCH: 30.4 pg (ref 26.0–34.0)
MCHC: 32.4 g/dL (ref 30.0–36.0)
MCV: 92.5 fL (ref 78.0–100.0)
MCV: 92.2 fL (ref 78.0–100.0)
MCV: 92.6 fL (ref 78.0–100.0)
Platelets: 108 10*3/uL — ABNORMAL LOW (ref 150–400)
Platelets: 103 10*3/uL — ABNORMAL LOW (ref 150–400)
Platelets: 107 10*3/uL — ABNORMAL LOW (ref 150–400)
RBC: 3.6 MIL/uL — ABNORMAL LOW (ref 3.87–5.11)
RBC: 3.61 MIL/uL — ABNORMAL LOW (ref 3.87–5.11)
RBC: 3.74 MIL/uL — ABNORMAL LOW (ref 3.87–5.11)
RBC: 3.49 MIL/uL — ABNORMAL LOW (ref 3.87–5.11)
RDW: 14 % (ref 11.5–15.5)
RDW: 14.1 % (ref 11.5–15.5)
WBC: 11.5 10*3/uL — ABNORMAL HIGH (ref 4.0–10.5)
WBC: 13.3 10*3/uL — ABNORMAL HIGH (ref 4.0–10.5)
WBC: 14.2 10*3/uL — ABNORMAL HIGH (ref 4.0–10.5)

## 2011-09-27 LAB — BASIC METABOLIC PANEL
BUN: 15 mg/dL (ref 6–23)
CO2: 15 mEq/L — ABNORMAL LOW (ref 19–32)
Chloride: 113 mEq/L — ABNORMAL HIGH (ref 96–112)
Creatinine, Ser: 1.77 mg/dL — ABNORMAL HIGH (ref 0.50–1.10)
GFR calc Af Amer: 35 mL/min — ABNORMAL LOW (ref >90)
Potassium: 4.2 mEq/L (ref 3.5–5.1)
Sodium: 136 mEq/L (ref 135–145)

## 2011-09-27 LAB — CARDIAC PANEL(CRET KIN+CKTOT+MB+TROPI)
CK, MB: 2.7 ng/mL (ref 0.3–4.0)
Troponin I: <0.3 ng/mL (ref <0.30)

## 2011-09-27 NOTE — Progress Notes (Signed)
Subjective:  Patient feels fine, but still has small amount of bleeding per rectum. No SOB  or chest pain.   Objective: Vital signs in last 24 hours: Filed Vitals:   09/27/11 0900 09/27/11 1000 09/27/11 1100 09/27/11 1200  BP: 172/75 169/74 151/62 150/65  Pulse: 77 73 71 64  Temp:    98 F (36.7 C)  TempSrc:    Oral  Resp: 14 19 14 17   Height:      Weight:      SpO2: 100% 99% 99% 99%   Weight change:   Intake/Output Summary (Last 24 hours) at 09/27/11 1340 Last data filed at 09/27/11 1200  Gross per 24 hour  Intake 4498.33 ml  Output   1151 ml  Net 3347.33 ml   Physical Exam: General: resting in bed, not in acute distress HEENT: PERRL, EOMI, no scleral icterus Cardiac: S1/S2, RRR, No murmurs, gallops or rubs Pulm: Good air movement bilaterally, Clear to auscultation bilaterally, No rales, wheezing, rhonchi or rubs. Abd: Soft,  Non-distended, tender over lower quadrant and left middle quadrant, no rebound pain, no organomegaly, BS present Ext: No rashes or edema, 2+DP/PT pulse bilaterally. Lost two toes on the left foot (second and third toes).   Neuro: alert and oriented X3, cranial nerves II-XII grossly intact, muscle strength 5/5 in all extremeties,  sensation to light touch intact except for decreased sensation on the right lower leg.  Lab Results: Basic Metabolic Panel:  Lab [id number] 0400 09/26/11 0645  NA 136 140  K 4.2 4.9  CL 113* 110  CO2 15* 18*  GLUCOSE 85 139*  BUN 15 26*  CREATININE 1.77* 2.09*  CALCIUM 7.8* 9.7  MG -- --  PHOS -- --   Liver Function Tests:  Lab 09/26/11 0645  AST 21  ALT 12  ALKPHOS 102  BILITOT 0.4  PROT 7.6  ALBUMIN 3.5   CBC:  Lab 09/27/11 1000 09/27/11 0400  WBC 13.3* 10.7*  NEUTROABS -- --  HGB 10.8* 10.9*  HCT 33.2* 33.3*  MCV 92.2 92.2  PLT 107* 103*   Cardiac Enzymes:  Lab 09/27/11 1000 09/26/11 2324 09/26/11 1603  CKTOTAL 49 52 69  CKMB 2.7 2.6 3.3  CKMBINDEX -- -- --  TROPONINI <0.30 <0.30 <0.30     Lab 09/27/11 1219 09/27/11 0831 09/27/11 0504 09/27/11 0106 09/26/11 2138 09/26/11 2043  GLUCAP 114* 70 88 83 78 77   Coagulation:  Lab 09/26/11 1552  LABPROT 14.3  INR 1.09   Anemia Panel: No results found for this basename: VITAMINB12,FOLATE,FERRITIN,TIBC,IRON,RETICCTPCT in the last 168 hours Urine Drug Screen: Drugs of Abuse     Component Value Date/Time   LABOPIA NEGATIVE 10/30/2009 [place] 10/30/2009 [place] 10/30/2009 [place] 10/30/2009 0458      Micro Results: Recent Results (from the past 240 hour(s))  MRSA PCR SCREENING     Status: Normal   Collection Time   09/26/11  5:49 PM      Component Value Range Status Comment   MRSA by PCR NEGATIVE  NEGATIVE  Final    Studies/Results: Ct Abdomen Pelvis Wo Contrast  09/26/2011  *RADIOLOGY REPORT*  Clinical Data: Rectal bleeding, chronic kidney disease  CT ABDOMEN AND PELVIS WITHOUT CONTRAST  Technique:  Multidetector CT imaging of the abdomen and pelvis was performed following the standard protocol without intravenous contrast.  Comparison: 02/19/2010  Findings: Mild dependent atelectasis at the right lung base.  The heart is top normal  in size.  Pacemaker lead, incompletely visualized.  Unenhanced liver, spleen, pancreas, and adrenal glands are unremarkable.  Gallbladder is unremarkable.  No intrahepatic or extrahepatic ductal dilatation.  Stable lobulated appearance of the bilateral kidneys.  10 mm medial left lower pole hyperdense cyst (series 2/image 33).  No renal calculi or hydronephrosis.  Stable perinephric stranding.  No evidence of bowel obstruction.  Normal appendix.  Distal colon is fluid-filled.  No colonic wall thickening or inflammatory changes.  Atherosclerotic calcifications of the abdominal aorta and branch vessels.  No suspicious abdominopelvic lymphadenopathy.  Status post hysterectomy.  No adnexal masses.  Trace pelvic ascites, stable.  Bladder is remarkable.   Visualized osseous structures are within normal limits.  IMPRESSION: Distal colon is fluid-filled.  No colonic wall thickening or inflammatory changes.  No evidence of bowel obstruction.  Normal appendix.  Stable lobulated appearance of the bilateral kidneys with perinephric stranding.  Original Report Authenticated By: Julian Hy, M.D.   Medications:  Scheduled Meds:   . dextrose      . gabapentin  300 mg Oral TID  . simvastatin  40 mg Oral Daily  . travoprost (benzalkonium)  1 drop Both Eyes QHS  . Vitamin D (Ergocalciferol)  50,000 Units Oral Q7 days   Continuous Infusions:   . sodium chloride 200 mL/hr at 09/27/11 0806  . DISCONTD: pantoprozole (PROTONIX) infusion 8 mg/hr (09/27/11 0131)   PRN Meds:.insulin aspart, morphine, ondansetron (ZOFRAN) IV, ondansetron, DISCONTD: insulin regular Assessment/Plan:  60 y/o w with pmh of CAD (EF 55%), h/o sinus arrest with pacemaker, HTN, DM, chronic diarrhea (unknown cause) comes to the Ed with BRBPR, abdmonial pain and vomiting. Also has dizziness.  1. Hematochezia- painful rectal bleeding. Etiology is not clear now. DD include gastroenteritis, diverticulitis, hemorrhoids, C. Diff colitis (patient took Augmentin for foot infection last month.),  ischemic colitis, anal fissure, profuse upper GI source like gastritis or DU. GI has seen pt and feels that ischemic colitis is most likely dx. Pt will be observed on clears, IVF, and see if time resolves the bleeding. Colonoscope or sigmoidoscopy if no improvement. Hb is down 10.8 which is stable since yesterday.   - will continue IV fluid at 100 cc/hour -check CBC q6hour, transfuse if drops <8  - continue protonix drip for possible upper GI source  - morphine for pain  -pending C diff PCR and stool culture  2. T wave inversion in lateral leads- new from previous EKG change. Asymptomatic. Troponin negative X 3. Will monitor.  3. DM- SSI and lantus (hold until npo)   4. HTN- hold  antihypertensive medications in setting of GI bleed.    5. HLD- continue statin when not npo   6. DVT Px- SCD    7 Code- full code          LOS: 1 day   Ivor Costa 09/27/2011, 1:40 PM
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
## 2011-09-27 NOTE — H&P (Signed)
Internal Medicine Teaching Service Attending Note Date: 09/27/2011  Patient name: [name] [name]  Medical record number: [id number]  Date of birth: 18-Jun-1951   I have seen and evaluated [name] [name] and discussed their care with the Residency Team. Full details of the pts' HPI are listed in Dr [name] [name] H&P. Pt had sudden onset of simultaneous BRBPR and ABD pain. HgB trended down from 12.7 to 10.8 (baseline). Had colonoscopy about 10 yrs ago but results are unknown.   Pt had temp of 100.2 last PM. No cough, rash, HA, dysuria. Per pt + hematuria.   Physical Exam: Blood pressure 150/65, pulse 64, temperature 98 F (36.7 C), temperature source Oral, resp. rate 17, height 5\' 2"  (1.575 m), weight 163 lb 5.8 oz (74.1 kg), SpO2 99.00%. Gen : NAD, lying in bed. C/O abd pain after last MD pressed abd on examination LCTAB HRRR no murmurs ABD + BS in all four quandrants. TTP lower quadrants and left mid abd Neuro - no focal  Lab results: Results for orders placed during the hospital encounter of 09/26/11 (from the past 24 hour(s))  PROTIME-INR     Status: Normal   Collection Time   09/26/11  3:52 PM      Component Value Range   Prothrombin Time 14.3  11.6 - 15.2 (seconds)   INR 1.09  0.00 - 1.49   APTT     Status: Normal   Collection Time   09/26/11  3:52 PM      Component Value Range   aPTT 24  24 - 37 (seconds)  CARDIAC PANEL(CRET KIN+CKTOT+MB+TROPI)     Status: Normal   Collection Time   09/26/11  4:03 PM      Component Value Range   Total CK 69  7 - 177 (U/L)   CK, MB 3.3  0.3 - 4.0 (ng/mL)   Troponin I <0.30  <0.30 (ng/mL)   Relative Index RELATIVE INDEX IS INVALID  0.0 - 2.5   CBC     Status: Abnormal   Collection Time   09/26/11  4:05 PM      Component Value Range   WBC 12.5 (*) 4.0 - 10.5 (K/uL)   RBC 3.99  3.87 - 5.11 (MIL/uL)   Hemoglobin 11.9 (*) 12.0 - 15.0 (g/dL)   HCT 36.5  36.0 - 46.0 (%)   MCV 91.5  78.0 - 100.0 (fL)   MCH 29.8  26.0 - 34.0 (pg)   MCHC 32.6  30.0  - 36.0 (g/dL)   RDW 14.1  11.5 - 15.5 (%)   Platelets PLATELET CLUMPS NOTED ON SMEAR, UNABLE TO ESTIMATE  150 - 400 (K/uL)  LACTIC ACID, PLASMA     Status: Normal   Collection Time   09/26/11  4:06 PM      Component Value Range   Lactic Acid, Venous 1.4  0.5 - 2.2 (mmol/L)  MRSA PCR SCREENING     Status: Normal   Collection Time   09/26/11  5:49 PM      Component Value Range   MRSA by PCR NEGATIVE  NEGATIVE   GLUCOSE, CAPILLARY     Status: Abnormal   Collection Time   09/26/11  6:06 PM      Component Value Range   Glucose-Capillary 64 (*) 70 - 99 (mg/dL)   Comment 1 Notify RN    GLUCOSE, CAPILLARY     Status: Abnormal   Collection Time   09/26/11  6:44 PM      Component Value  Range   Glucose-Capillary 151 (*) 70 - 99 (mg/dL)   Comment 1 Notify RN    CBC     Status: Abnormal   Collection Time   09/26/11  7:35 PM      Component Value Range   WBC 12.8 (*) 4.0 - 10.5 (K/uL)   RBC 3.87  3.87 - 5.11 (MIL/uL)   Hemoglobin 11.6 (*) 12.0 - 15.0 (g/dL)   HCT 35.7 (*) 36.0 - 46.0 (%)   MCV 92.2  78.0 - 100.0 (fL)   MCH 30.0  26.0 - 34.0 (pg)   MCHC 32.5  30.0 - 36.0 (g/dL)   RDW 14.0  11.5 - 15.5 (%)   Platelets 128 (*) 150 - 400 (K/uL)  GLUCOSE, CAPILLARY     Status: Normal   Collection Time   09/26/11  8:43 PM      Component Value Range   Glucose-Capillary 77  70 - 99 (mg/dL)   Comment 1 Documented in Chart     Comment 2 Notify RN    GLUCOSE, CAPILLARY     Status: Normal   Collection Time   09/26/11  9:38 PM      Component Value Range   Glucose-Capillary 78  70 - 99 (mg/dL)   Comment 1 Documented in Chart     Comment 2 Notify RN    CBC     Status: Abnormal   Collection Time   09/26/11 11:23 PM      Component Value Range   WBC 11.5 (*) 4.0 - 10.5 (K/uL)   RBC 3.60 (*) 3.87 - 5.11 (MIL/uL)   Hemoglobin 10.8 (*) 12.0 - 15.0 (g/dL)   HCT 33.3 (*) 36.0 - 46.0 (%)   MCV 92.5  78.0 - 100.0 (fL)   MCH 30.0  26.0 - 34.0 (pg)   MCHC 32.4  30.0 - 36.0 (g/dL)   RDW 14.3   11.5 - 15.5 (%)   Platelets 108 (*) 150 - 400 (K/uL)  CARDIAC PANEL(CRET KIN+CKTOT+MB+TROPI)     Status: Normal   Collection Time   09/26/11 11:24 PM      Component Value Range   Total CK 52  7 - 177 (U/L)   CK, MB 2.6  0.3 - 4.0 (ng/mL)   Troponin I <0.30  <0.30 (ng/mL)   Relative Index RELATIVE INDEX IS INVALID  0.0 - 2.5   GLUCOSE, CAPILLARY     Status: Normal   Collection Time   09/27/11  1:06 AM      Component Value Range   Glucose-Capillary 83  70 - 99 (mg/dL)   Comment 1 Documented in Chart     Comment 2 Notify RN    CBC     Status: Abnormal   Collection Time   09/27/11  4:00 AM      Component Value Range   WBC 10.7 (*) 4.0 - 10.5 (K/uL)   RBC 3.61 (*) 3.87 - 5.11 (MIL/uL)   Hemoglobin 10.9 (*) 12.0 - 15.0 (g/dL)   HCT 33.3 (*) 36.0 - 46.0 (%)   MCV 92.2  78.0 - 100.0 (fL)   MCH 30.2  26.0 - 34.0 (pg)   MCHC 32.7  30.0 - 36.0 (g/dL)   RDW 14.1  11.5 - 15.5 (%)   Platelets 103 (*) 150 - 400 (K/uL)  BASIC METABOLIC PANEL     Status: Abnormal   Collection Time   09/27/11  4:00 AM      Component Value Range   Sodium 136  135 -  145 (mEq/L)   Potassium 4.2  3.5 - 5.1 (mEq/L)   Chloride 113 (*) 96 - 112 (mEq/L)   CO2 15 (*) 19 - 32 (mEq/L)   Glucose, Bld 85  70 - 99 (mg/dL)   BUN 15  6 - 23 (mg/dL)   Creatinine, Ser 1.77 (*) 0.50 - 1.10 (mg/dL)   Calcium 7.8 (*) 8.4 - 10.5 (mg/dL)   GFR calc non Af Amer 31 (*) >90 (mL/min)   GFR calc Af Amer 35 (*) >90 (mL/min)  GLUCOSE, CAPILLARY     Status: Normal   Collection Time   09/27/11  5:04 AM      Component Value Range   Glucose-Capillary 88  70 - 99 (mg/dL)   Comment 1 Notify RN     Comment 2 Documented in Chart    GLUCOSE, CAPILLARY     Status: Normal   Collection Time   09/27/11  8:31 AM      Component Value Range   Glucose-Capillary 70  70 - 99 (mg/dL)   Comment 1 Documented in Chart     Comment 2 Notify RN    CBC     Status: Abnormal   Collection Time   09/27/11 10:00 AM      Component Value Range   WBC  13.3 (*) 4.0 - 10.5 (K/uL)   RBC 3.60 (*) 3.87 - 5.11 (MIL/uL)   Hemoglobin 10.8 (*) 12.0 - 15.0 (g/dL)   HCT 33.2 (*) 36.0 - 46.0 (%)   MCV 92.2  78.0 - 100.0 (fL)   MCH 30.0  26.0 - 34.0 (pg)   MCHC 32.5  30.0 - 36.0 (g/dL)   RDW 14.0  11.5 - 15.5 (%)   Platelets 107 (*) 150 - 400 (K/uL)  CARDIAC PANEL(CRET KIN+CKTOT+MB+TROPI)     Status: Normal   Collection Time   09/27/11 10:00 AM      Component Value Range   Total CK 49  7 - 177 (U/L)   CK, MB 2.7  0.3 - 4.0 (ng/mL)   Troponin I <0.30  <0.30 (ng/mL)   Relative Index RELATIVE INDEX IS INVALID  0.0 - 2.5   GLUCOSE, CAPILLARY     Status: Abnormal   Collection Time   09/27/11 12:19 PM      Component Value Range   Glucose-Capillary 114 (*) 70 - 99 (mg/dL)   Comment 1 Documented in Chart     Comment 2 Notify RN      Imaging results:  Ct Abdomen Pelvis Wo Contrast  09/26/2011  *RADIOLOGY REPORT*  Clinical Data: Rectal bleeding, chronic kidney disease  CT ABDOMEN AND PELVIS WITHOUT CONTRAST  Technique:  Multidetector CT imaging of the abdomen and pelvis was performed following the standard protocol without intravenous contrast.  Comparison: 02/19/2010  Findings: Mild dependent atelectasis at the right lung base.  The heart is top normal in size.  Pacemaker lead, incompletely visualized.  Unenhanced liver, spleen, pancreas, and adrenal glands are unremarkable.  Gallbladder is unremarkable.  No intrahepatic or extrahepatic ductal dilatation.  Stable lobulated appearance of the bilateral kidneys.  10 mm medial left lower pole hyperdense cyst (series 2/image 33).  No renal calculi or hydronephrosis.  Stable perinephric stranding.  No evidence of bowel obstruction.  Normal appendix.  Distal colon is fluid-filled.  No colonic wall thickening or inflammatory changes.  Atherosclerotic calcifications of the abdominal aorta and branch vessels.  No suspicious abdominopelvic lymphadenopathy.  Status post hysterectomy.  No adnexal masses.  Trace pelvic  ascites, stable.  Bladder is remarkable.  Visualized osseous structures are within normal limits.  IMPRESSION: Distal colon is fluid-filled.  No colonic wall thickening or inflammatory changes.  No evidence of bowel obstruction.  Normal appendix.  Stable lobulated appearance of the bilateral kidneys with perinephric stranding.  Original Report Authenticated By: Julian Hy, M.D.    Assessment and Plan: I agree with the formulated Assessment and Plan with the following changes:   1. Hematochezia - assoc with abd pain. Diff dx diverticulitis (slight leukocytosis and fever but CT negative), ischemic colitis (does have RF for vascular dz), internal hemorrhoids (none on exam and doesn't explain abd pain), AVM (doesn't explain pain), C Dif (recent ABX). GI has seen pt and feels that ischemic colitis is most likely dx. Pt will be observed on clears, IVF, and see if time resolves the bleeding. Colon / sigmoid - oscopy if no improvement.  2. Lateral T wave changes - cycle cardiac enzymes - all negative. Monitor on tele.  3. DM - hold insulin while NPO and follow CBG's.

## 2011-09-28 LAB — CBC
HCT: 27.7 % — ABNORMAL LOW (ref 36.0–46.0)
HCT: 31.7 % — ABNORMAL LOW (ref 36.0–46.0)
Hemoglobin: 10.4 g/dL — ABNORMAL LOW (ref 12.0–15.0)
Hemoglobin: 9.1 g/dL — ABNORMAL LOW (ref 12.0–15.0)
MCH: 30 pg (ref 26.0–34.0)
MCH: 30.2 pg (ref 26.0–34.0)
MCH: 30.3 pg (ref 26.0–34.0)
MCHC: 32.7 g/dL (ref 30.0–36.0)
MCHC: 32.9 g/dL (ref 30.0–36.0)
MCV: 92.4 fL (ref 78.0–100.0)
MCV: 92.2 fL (ref 78.0–100.0)
Platelets: 113 10*3/uL — ABNORMAL LOW (ref 150–400)
RBC: 3.43 MIL/uL — ABNORMAL LOW (ref 3.87–5.11)
RBC: 3.71 MIL/uL — ABNORMAL LOW (ref 3.87–5.11)
RDW: 14.3 % (ref 11.5–15.5)
WBC: 10.9 10*3/uL — ABNORMAL HIGH (ref 4.0–10.5)
WBC: 13.2 10*3/uL — ABNORMAL HIGH (ref 4.0–10.5)

## 2011-09-28 LAB — CLOSTRIDIUM DIFFICILE BY PCR: Toxigenic C. Difficile by PCR: POSITIVE — AB

## 2011-09-28 LAB — BASIC METABOLIC PANEL
BUN: 11 mg/dL (ref 6–23)
CO2: 15 mEq/L — ABNORMAL LOW (ref 19–32)
Calcium: 8.2 mg/dL — ABNORMAL LOW (ref 8.4–10.5)
Chloride: 116 mEq/L — ABNORMAL HIGH (ref 96–112)
Glucose, Bld: 80 mg/dL (ref 70–99)
Potassium: 4 mEq/L (ref 3.5–5.1)

## 2011-09-28 LAB — GLUCOSE, CAPILLARY
Glucose-Capillary: 136 mg/dL — ABNORMAL HIGH (ref 70–99)
Glucose-Capillary: 73 mg/dL (ref 70–99)
Glucose-Capillary: 89 mg/dL (ref 70–99)

## 2011-09-28 MED ORDER — METRONIDAZOLE 500 MG PO TABS
500.0000 mg | ORAL_TABLET | Freq: Three times a day (TID) | ORAL | Status: DC
  Administered 2011-09-28 – 2011-09-30 (×6): 500 mg via ORAL
  Filled 2011-09-28 (×10): qty 1

## 2011-09-28 NOTE — Progress Notes (Addendum)
Subjective: Patient still has abd pain in Left mid quadrant and lower abdomen. Had 3 BM overnight with small amout of blood in the stool.  I looked the stool, which is mixed with streak of blood and some mucus. No SOB or chest pain.   Objective: Vital signs in last 24 hours: Filed Vitals:   09/28/11 0500 09/28/11 0600 09/28/11 0700 09/28/11 0733  BP: 164/64 162/74 149/53 165/90  Pulse: 73 65 71 85  Temp:    97.8 F (36.6 C)  TempSrc:    Oral  Resp: 13 16 19 15   Height:      Weight: 165 lb 5.5 oz (75 kg)     SpO2: 99% 99% 96% 98%    Physical Exam:  General: resting in bed, not in acute distress HEENT: PERRL, EOMI, no scleral icterus Cardiac: S1/S2, RRR, No murmurs, gallops or rubs Pulm: Good air movement bilaterally, Clear to auscultation bilaterally, No rales, wheezing, rhonchi or rubs. Abd: Soft,  Non-distended, tender over lower quadrant and left middle quadrant, no rebound pain, no organomegaly, BS present Ext: No rashes or edema, 2+DP/PT pulse bilaterally. Lost two toes on the left foot (second and third toes).   Neuro: alert and oriented X3, cranial nerves II-XII grossly intact, muscle strength 5/5 in all extremeties,  sensation to light touch intact except for decreased sensation on the right lower leg.  Lab Results: Basic Metabolic Panel:  Lab [id number] 09/27/11 0400  NA 140 136  K 4.0 4.2  CL 116* 113*  CO2 15* 15*  GLUCOSE 80 85  BUN 11 15  CREATININE 1.71* 1.77*  CALCIUM 8.2* 7.8*  MG -- --  PHOS -- --   Liver Function Tests:  Lab 09/26/11 0645  AST 21  ALT 12  ALKPHOS 102  BILITOT 0.4  PROT 7.6  ALBUMIN 3.5   CBC:  Lab 09/28/11 0602 09/27/11 2345  WBC 10.9* 12.1*  NEUTROABS -- --  HGB 10.4* 9.1*  HCT 31.7* 27.7*  MCV 92.4 91.4  PLT 94* 89*   Cardiac Enzymes:  Lab 09/27/11 1000 09/26/11 2324 09/26/11 1603  CKTOTAL 49 52 69  CKMB 2.7 2.6 3.3  CKMBINDEX -- -- --  TROPONINI <0.30 <0.30 <0.30     Lab 09/28/11 0734 09/28/11 0443  09/28/11 0031 09/27/11 2027 09/27/11 1827 09/27/11 1649  GLUCAP 76 73 89 99 102* 69*     Lab 09/26/11 1552  LABPROT 14.3  INR 1.09    Drugs of Abuse     Component Value Date/Time   LABOPIA NEGATIVE 10/30/2009 0458   COCAINSCRNUR NEGATIVE 10/30/2009 0458   LABBENZ NEGATIVE 10/30/2009 0458   AMPHETMU NEGATIVE 10/30/2009 0458      Micro Results: Recent Results (from the past 240 hour(s))  MRSA PCR SCREENING     Status: Normal   Collection Time   09/26/11  5:49 PM      Component Value Range Status Comment   MRSA by PCR NEGATIVE  NEGATIVE  Final    Studies/Results: No results found. Medications:  Scheduled Meds:   . gabapentin  300 mg Oral TID  . simvastatin  40 mg Oral Daily  . travoprost (benzalkonium)  1 drop Both Eyes QHS  . Vitamin D (Ergocalciferol)  50,000 Units Oral Q7 days   Continuous Infusions:   . sodium chloride 100 mL/hr at 09/28/11 0053   PRN Meds:.insulin aspart, morphine, ondansetron (ZOFRAN) IV, ondansetron Assessment/Plan:  60 y/o w with pmh of CAD (EF 55%), h/o sinus arrest with pacemaker, HTN,  DM, chronic diarrhea (unknown cause) comes to the Ed with BRBPR, abdmonial pain and vomiting. Also has dizziness.  1. Hematochezia- painful rectal bleeding. Etiology is not clear now. DD include diverticulitis, hemorrhoids, C. Diff colitis (patient took Augmentin for foot infection last month.),  ischemic colitis. GI has seen pt and feels that ischemic colitis is most likely dx. Pt will be observed on clears, IVF, and see if time resolves the bleeding. Colonoscope or sigmoidoscopy if no improvement. Hb is down 10.4 which is stable since yesterday.  - will continue IV fluid at 75 cc/hour -check CBC q6hour, transfuse if drops <8  - continue protonix drip for possible upper GI source  - morphine for pain  -pending C diff PCR and stool culture  2. T wave inversion in lateral leads- new from previous EKG change. Asymptomatic. Troponin negative X 3. Will monitor.  3.  DM- SSI and lantus (hold until npo)   4. HTN- hold antihypertensive medications in setting of GI bleed.    5. HLD- continue statin when not npo   6. DVT Px- SCD    7 Code- full code     LOS: 2 days   [name] [name] 09/28/2011, 11:55 AM   8. Patient's D diff PCR came back positive. Will start Flagyl 500 mg Tid po.

## 2011-09-28 NOTE — Progress Notes (Signed)
[name] [name] 3:37 PM  Subjective: Patient says she is about the same but tolerating clear liquids having loose stools and still seeing a little bit of bright red blood.  Objective: Vital signs stable afebrile no acute distress abdomen is still little tender little firm occasional bowel sounds labs are pertinent for a stable hemoglobin and a slightly decreased white count  Assessment: Probable ischemic colitis  Plan: I discussed giving her one more day and then proceeding on Tuesday with a colonoscopy but she would really like to proceed tomorrow per Dr. Paulita Fujita to confirm the diagnosis  [facility] E

## 2011-09-28 NOTE — Progress Notes (Signed)
Addendum to my note the patient's C diff did come back positive and she was started on Flagyl but will proceed with flexible sigmoidoscopy or colonoscopy tomorrow just to confirm

## 2011-09-29 ENCOUNTER — Encounter (HOSPITAL_COMMUNITY): Payer: Self-pay | Admitting: *Deleted

## 2011-09-29 ENCOUNTER — Other Ambulatory Visit: Payer: Self-pay | Admitting: Gastroenterology

## 2011-09-29 ENCOUNTER — Encounter (HOSPITAL_COMMUNITY)
Admission: EM | Disposition: A | Discharge status: Home / Self Care | Payer: Self-pay | Source: Home / Self Care | Attending: Internal Medicine

## 2011-09-29 HISTORY — PX: FLEXIBLE SIGMOIDOSCOPY: SHX5431

## 2011-09-29 LAB — CBC
HCT: 31.5 % — ABNORMAL LOW (ref 36.0–46.0)
HCT: 30 % — ABNORMAL LOW (ref 36.0–46.0)
Hemoglobin: 9.9 g/dL — ABNORMAL LOW (ref 12.0–15.0)
MCH: 29.9 pg (ref 26.0–34.0)
MCHC: 33 g/dL (ref 30.0–36.0)
MCHC: 33 g/dL (ref 30.0–36.0)
MCV: 91.5 fL (ref 78.0–100.0)
RBC: 3.28 MIL/uL — ABNORMAL LOW (ref 3.87–5.11)
RDW: 13.9 % (ref 11.5–15.5)
WBC: 11.7 10*3/uL — ABNORMAL HIGH (ref 4.0–10.5)
WBC: 8.4 10*3/uL (ref 4.0–10.5)

## 2011-09-29 LAB — BASIC METABOLIC PANEL
CO2: 18 mEq/L — ABNORMAL LOW (ref 19–32)
Chloride: 114 mEq/L — ABNORMAL HIGH (ref 96–112)
Creatinine, Ser: 1.56 mg/dL — ABNORMAL HIGH (ref 0.50–1.10)
Potassium: 4 mEq/L (ref 3.5–5.1)
Sodium: 139 mEq/L (ref 135–145)

## 2011-09-29 LAB — GLUCOSE, CAPILLARY: Glucose-Capillary: 94 mg/dL (ref 70–99)

## 2011-09-29 LAB — GIARDIA/CRYPTOSPORIDIUM SCREEN(EIA)
Cryptosporidium Screen (EIA): NEGATIVE
Giardia Screen - EIA: NEGATIVE

## 2011-09-29 SURGERY — SIGMOIDOSCOPY, FLEXIBLE
Anesthesia: Moderate Sedation

## 2011-09-29 MED ORDER — FENTANYL CITRATE 0.05 MG/ML IJ SOLN
INTRAMUSCULAR | Status: AC
  Filled 2011-09-29: qty 2

## 2011-09-29 MED ORDER — CARVEDILOL 6.25 MG PO TABS
6.2500 mg | ORAL_TABLET | Freq: Two times a day (BID) | ORAL | Status: DC
  Administered 2011-09-29 – 2011-09-30 (×2): 6.25 mg via ORAL
  Filled 2011-09-29 (×4): qty 1

## 2011-09-29 MED ORDER — MORPHINE SULFATE 15 MG PO TABS: 15.0000 mg | ORAL_TABLET | ORAL | Status: DC | PRN

## 2011-09-29 MED ORDER — POLYETHYLENE GLYCOL 3350 17 G PO PACK
17.0000 g | PACK | ORAL | Status: AC
  Administered 2011-09-29 (×2): 17 g via ORAL
  Filled 2011-09-29 (×2): qty 1

## 2011-09-29 MED ORDER — MIDAZOLAM HCL 10 MG/2ML IJ SOLN
INTRAMUSCULAR | Status: AC
  Filled 2011-09-29: qty 2

## 2011-09-29 MED ORDER — LISINOPRIL 5 MG PO TABS
5.0000 mg | ORAL_TABLET | Freq: Every day | ORAL | Status: DC
  Administered 2011-09-29 – 2011-09-30 (×2): 5 mg via ORAL
  Filled 2011-09-29 (×2): qty 1

## 2011-09-29 MED ORDER — MIDAZOLAM HCL 10 MG/2ML IJ SOLN
INTRAMUSCULAR | Status: DC | PRN
  Administered 2011-09-29 (×2): 2 mg via INTRAVENOUS

## 2011-09-29 MED ORDER — FENTANYL CITRATE 0.05 MG/ML IJ SOLN
INTRAMUSCULAR | Status: DC | PRN
  Administered 2011-09-29 (×2): 25 ug via INTRAVENOUS

## 2011-09-29 NOTE — Progress Notes (Signed)
Pre-procedure orders for colonoscopy were found in comment section of vital signs, therefore orders didn't appear on Medication Administration Record. Physician on call notified @ [phone] and orders received.

## 2011-09-29 NOTE — Interval H&P Note (Signed)
History and Physical Interval Note:  09/29/2011 11:34 AM  [name] [name]  has presented today for surgery, with the diagnosis of Gi Bleed,R/O C-diff vs Ischemic Colitis, Abd Pain  The various methods of treatment have been discussed with the patient and family. After consideration of risks, benefits and other options for treatment, the patient has consented to  Procedure(s): FLEXIBLE SIGMOIDOSCOPY as a surgical intervention .  The patients' history has been reviewed, patient examined, no change in status, stable for surgery.  I have reviewed the patients' chart and labs.  Questions were answered to the patient's satisfaction.     Landry Dyke  Risks (bleeding, infection, bowel perforation that could require surgery, sedation-related changes in cardiopulmonary systems), benefits (identification and possible treatment of source of symptoms, exclusion of certain causes of symptoms), and alternatives (watchful waiting, radiographic imaging studies, empiric medical treatment) of colonoscopy/sigmoidoscopy were explained to patient in detail and patient wishes to proceed.

## 2011-09-29 NOTE — Op Note (Signed)
[facility] [place], [place]  [id number]  [place] PROCEDURE REPORT  PATIENT:  [name] [name], [name] [name]  MR#:  [id number] BIRTHDATE:  Oct 08, 1950, 84 yrs. old  GENDER:  female  ENDOSCOPIST:  Arta Silence, MD Referred by:  Larey Dresser, M.D.  PROCEDURE DATE:  09/29/2011 PROCEDURE:  Flexible Sigmoidoscopy with biopsy ASA CLASS:  Class III INDICATIONS:  abdominal pain, bloody diarrhea  MEDICATIONS:   Fentanyl 50 mcg IV, Versed 4 mg IV  DESCRIPTION OF PROCEDURE:   After the risks benefits and alternatives of the procedure were thoroughly explained, informed consent was obtained.  No rectal exam performed. The [id number] endoscope was introduced through the anus and advanced to the mid transverse colon, without limitations.  The quality of the prep was .  The instrument was then slowly withdrawn as the mucosa was fully examined.  <<PROCEDUREIMAGES>>  FINDINGS:  Rectal exam normal.  Prep quality was satisfactory to level of distal transverse colon.  No fresh blood identified.  No diverticula seen.  At level of splenic flexure, there was a 15cm segment of patchy superficial colitis with mucosal ulceration, mucosal edema, loss-of-vascularity, and friability.  Appearance most typical of ischemic colitis; biopsies were taken with cold forceps.  No pseudomembranes were identified.  Mild internal hemorrhoids, otherwise normal retroflexed view of rectum. ENDOSCOPIC IMPRESSION:    1.  Changes of colitis near splenic flexure, most typical of ishemic colitis, biopsied. 2.  No pseudomembranes.          3.  Overall, suspect ischemic colitis is source of her pain and bloody diarrhea. However, lack             of pseudomembranes may be reflective of partial treatment of C. diff colitis.  Endoscopic appearance, overall, however, is not typical of c. diff colitis.  RECOMMENDATIONS:      1.  Watch for potential complications of procedure. 2.   Await biopsies. 3.  Advance diet.  Complete 14-day course of metronidazole. 4.  Don't anticipate need for further GI evaluation. 5.  Will sign off; please call with further questins.  REPEAT EXAM:  No  ______________________________ Arta Silence  CC:  n. eSIGNEDArta Silence at 09/29/2011 12:00 PM  [place], [id number]

## 2011-09-29 NOTE — H&P (View-Only) (Signed)
Addendum to my note the patient's C diff did come back positive and she was started on Flagyl but will proceed with flexible sigmoidoscopy or colonoscopy tomorrow just to confirm

## 2011-09-29 NOTE — Discharge Summary (Signed)
Patient Name:  [name] [name]  MRN: [id number]  PCP: Pryor Ochoa, MD, MD  DOB:  Jun 30, 1951    CSN: [id number]  Date of Admission:  09/26/2011  Date of Discharge:  09/30/2011      Attending Physician: Dr. Larey Dresser         DISCHARGE DIAGNOSES:  Principal Problem:  *Hematochezia Active Problems:  IDDM  HYPERLIPIDEMIA  HYPERTENSION  CHRONIC KIDNEY DISEASE STAGE III (MODERATE)  PACEMAKER, PERMANENT  DISPOSITION AND FOLLOW-UP:  DALEYSHA HUBERS is to follow-up with at [facility] in 7 days. In her visit, please check her CBC to make sure her Hgb is stable and no leukocytosis. Make sure she is taking her medications regularly. If she is taking her Flagyl and her symptoms do not improve, please consider to switch to vancomycin for C. Diff colitis or consider the possibility of worsening ischemic colitis. Please check her BMET to make sure her Cre is OK (we restarted her home lisinopril at discharge).   Follow-up Information    Please follow up. (please call for appointment in 7 days.)    Contact information:   [place].  [phone]         Discharge Orders    Future Appointments: Provider: Department: Dept Phone: Center:   12/25/2011 9:30 AM Deboraha Sprang, [place] [phone] [id number]     Future Orders Please Complete By Expires   Diet - low sodium heart healthy      Increase activity slowly      Call MD for:  temperature >100.4      Call MD for:  persistant nausea and vomiting      Call MD for:  severe uncontrolled pain      Call MD for:  redness, tenderness, or signs of infection (pain, swelling, redness, odor or green/yellow discharge around incision site)      Call MD for:  extreme fatigue      Call MD for:  persistant dizziness or light-headedness         DISCHARGE MEDICATIONS: Current Discharge Medication List    START taking these medications   Details  metroNIDAZOLE (FLAGYL) 500 MG tablet  Take 1 tablet (500 mg total) by mouth every 8 (eight) hours. Qty: 39 tablet, Refills: 0      CONTINUE these medications which have NOT CHANGED   Details  carvedilol (COREG) 6.25 MG tablet Take 6.25 mg by mouth 2 (two) times daily with a meal.      gabapentin (NEURONTIN) 300 MG capsule Take 300 mg by mouth 3 (three) times daily.      insulin glargine (LANTUS) 100 UNIT/ML injection Inject 10 Units into the skin at bedtime.      insulin regular (HUMULIN R,NOVOLIN R) 100 UNIT/ML injection Inject 0-2 Units into the skin 3 (three) times daily as needed. Per sliding scale.    isosorbide mononitrate (IMDUR) 30 MG 24 hr tablet Take 30 mg by mouth daily.      lisinopril (PRINIVIL,ZESTRIL) 10 MG tablet Take 10 mg by mouth daily.      oxyCODONE-acetaminophen (PERCOCET) 5-325 MG per tablet Take 1 tablet by mouth every 6 (six) hours as needed. For pain.     simvastatin (ZOCOR) 40 MG tablet Take 40 mg by mouth daily.      travoprost, benzalkonium, (TRAVATAN) 0.004 % ophthalmic solution Place 1 drop into both eyes at bedtime.      Vitamin D, Ergocalciferol, (  DRISDOL) 50000 UNITS CAPS Take 50,000 Units by mouth every 7 (seven) days. Takes on [place] taking these medications     amLODipine (NORVASC) 10 MG tablet      amoxicillin-clavulanate (AUGMENTIN) 875-125 MG per tablet      aspirin 81 MG chewable tablet      diphenoxylate-atropine (LOMOTIL) 2.5-0.025 MG/5ML liquid         CONSULTS: GI was consulted. Flexible sigmoidoscopy was performed by Dr. Paulita Fujita on 09/29/11.  PROCEDURES PERFORMED:  Ct Abdomen Pelvis Wo Contrast  09/26/2011  *RADIOLOGY REPORT*  Clinical Data: Rectal bleeding, chronic kidney disease  CT ABDOMEN AND PELVIS WITHOUT CONTRAST  Technique:  Multidetector CT imaging of the abdomen and pelvis was performed following the standard protocol without intravenous contrast.  Comparison: 02/19/2010  Findings: Mild dependent atelectasis at the right lung base.  The heart  is top normal in size.  Pacemaker lead, incompletely visualized.  Unenhanced liver, spleen, pancreas, and adrenal glands are unremarkable.  Gallbladder is unremarkable.  No intrahepatic or extrahepatic ductal dilatation.  Stable lobulated appearance of the bilateral kidneys.  10 mm medial left lower pole hyperdense cyst (series 2/image 33).  No renal calculi or hydronephrosis.  Stable perinephric stranding.  No evidence of bowel obstruction.  Normal appendix.  Distal colon is fluid-filled.  No colonic wall thickening or inflammatory changes.  Atherosclerotic calcifications of the abdominal aorta and branch vessels.  No suspicious abdominopelvic lymphadenopathy.  Status post hysterectomy.  No adnexal masses.  Trace pelvic ascites, stable.  Bladder is remarkable.  Visualized osseous structures are within normal limits.  IMPRESSION: Distal colon is fluid-filled.  No colonic wall thickening or inflammatory changes.  No evidence of bowel obstruction.  Normal appendix.  Stable lobulated appearance of the bilateral kidneys with perinephric stranding.  Original Report Authenticated By: Julian Hy, M.D.      ADMISSION DATA:  H&P: Patient is a 60 year old female with a PMH of DM-II, HTN, HLD, DIABETIC PERIPHERAL NEUROPATHY, CHRONIC KIDNEY DISEASE STAGE III, PACEMAKER, and Chronic Diarrhea, who presents with abdominal pain and bloody stool. Per patient, she started having nausea, vomiting, abdominal pain and diarrhea since 11:00 PM yesterday (she [place] [employer] before her symptoms started). Her abdominal pain is located at the left middle quadrant and lower abdomen, constant, 8/10 in severity, sharp and burning likely pain, non-radiating. It is not alleviated or aggravated by anything. She vomited 3 times of food materials without blood in it. When she had bowel movements, she noticed some bright red colored blood and also some dark clots in the toilet. She has total of 3 times of painful rectal bleeding and  bloody bowel movements. She also reports having dizziness, but not lightheadedness. Of note, patient took Augmentin for 7 days in last month for her foot infection.  Denies fever, chills, headaches,  cough, chest pain, SOB,  abdominal pain, dysuria, urgency, frequency, hematuria, joint pain or leg swelling.   Physical Exam:  Vitals: T:98.7       HR:74       BP: 148/73      RR: 14        O2 saturation: 100. orthostatus ( bp is 131/50, 122/65 and 128/68 at lying, sitting and standing positions, respectively).   General: resting in bed, not in acute distress HEENT: PERRL, EOMI, no scleral icterus Cardiac: S1/S2, RRR, No murmurs, gallops or rubs Pulm: Good air movement bilaterally, Clear to auscultation bilaterally, No rales, wheezing, rhonchi or rubs. Abd: Soft,  Non-distended, tender over lower quadrant and left middle quadrant, no rebound pain, no organomegaly, BS present Ext: No rashes or edema, 2+DP/PT pulse bilaterally. Lost two toes on the left foot (second and third toes).   Neuro: alert and oriented X3, cranial nerves II-XII grossly intact, muscle strength 5/5 in all extremeties,  sensation to light touch intact except for decreased sensation on the right lower leg.  Rectal exam: no hemorrhoid was found. Normal anal tone. There is streaks of blood mixed with brown colored stool.  EKG: Sinus rhythm, regular, first degree AV block, LAD (left anterior fascicular block), T wave inversion in V4 to V6 and biphasic T wave in V2 to V3.  Lab results: Basic Metabolic Panel:    [facility]  09/26/11 0645   NA  140   K  4.9   CL  110   CO2  18*   GLUCOSE  139*   BUN  26*   CREATININE  2.09*   CALCIUM  9.7   MG  --   PHOS  --    Liver Function Tests:    [facility]  09/26/11 0645   AST  21   ALT  12   ALKPHOS  102   BILITOT  0.4   PROT  7.6   ALBUMIN  3.5    CBC:    [facility]  09/26/11 0645   WBC  18.2*   NEUTROABS  --   HGB  12.7   HCT  38.3   MCV  92.7   PLT  131*      HOSPITAL COURSE:  1. Hematochezia- painful rectal bleeding, abdominal pain, vomiting and leukocytosis with WBC of 18.2 on admission. DD include diverticulitis, C. Diff colitis (patient took Augmentin for foot infection last month.),  ischemic colitis. Hemorrhoids. GI was consulted, feels that ischemic colitis is most likely dx, which is confirmed by sigmoidoscope. Pt also has positive PCR for C Diff colitis. Patient has been treated with IV fluid, oral Flagyl, symptomatic management with morphine and Zofran for abdominal pain and nausea . She responded to the treatment well. Her symptoms subside gradually. At discharge, she only has minimal abdominal pain, no leukocytosis, no fever, no bloody stool. Her diarrhea also improved significantly (had one BM overnight prior to discharge). Patient is discharged home on oral Flagyl for total of 14 days. She is to follow up at [facility] in 7 to 10 days.  2. T wave inversion in lateral leads- new from previous EKG change. Asymptomatic. Troponin negative X 3. No chest pain. Did not do further evaluation or treatment.  3. DM- controlled well with SSI in hospital. Patient will restart her lantus and Humulin at discharge.   4. HTN- initially we held her antihypertensive medications in setting of GI bleed. After her condition is stabilized, we started her home medications (coreg and lisinopril). Patient is discharged on Coreg and lisinopril.   5. HLD- LDL was 82 on 08/28/11. Continued statin, Zocor.   DISCHARGE DATA: Vital Signs: BP 163/63  Pulse 64  Temp(Src) 98.1 F (36.7 C) (Oral)  Resp 13  Ht 5\' 2"  (1.575 m)  Wt 149 lb 14.6 oz (68 kg)  BMI 27.42 kg/m2  SpO2 100%  Labs: Results for orders placed during the hospital encounter of 09/26/11 (from the past 24 hour(s))  GLUCOSE, CAPILLARY     Status: Abnormal   Collection Time   09/29/11  5:20 PM      Component Value Range   Glucose-Capillary 162 (*) 70 - 99 (  mg/dL)   Comment 1 Notify RN    GLUCOSE,  CAPILLARY     Status: Abnormal   Collection Time   09/29/11  9:22 PM      Component Value Range   Glucose-Capillary 146 (*) 70 - 99 (mg/dL)   Comment 1 Documented in Chart     Comment 2 Notify RN    GLUCOSE, CAPILLARY     Status: Abnormal   Collection Time   09/30/11  1:45 AM      Component Value Range   Glucose-Capillary 130 (*) 70 - 99 (mg/dL)   Comment 1 Documented in Chart     Comment 2 Notify RN    CBC     Status: Abnormal   Collection Time   09/30/11  4:35 AM      Component Value Range   WBC 7.1  4.0 - 10.5 (K/uL)   RBC 3.33 (*) 3.87 - 5.11 (MIL/uL)   Hemoglobin 9.9 (*) 12.0 - 15.0 (g/dL)   HCT 30.0 (*) 36.0 - 46.0 (%)   MCV 90.1  78.0 - 100.0 (fL)   MCH 29.7  26.0 - 34.0 (pg)   MCHC 33.0  30.0 - 36.0 (g/dL)   RDW 13.7  11.5 - 15.5 (%)   Platelets 126 (*) 150 - 400 (K/uL)  BASIC METABOLIC PANEL     Status: Abnormal   Collection Time   09/30/11  4:35 AM      Component Value Range   Sodium 139  135 - 145 (mEq/L)   Potassium 3.9  3.5 - 5.1 (mEq/L)   Chloride 112  96 - 112 (mEq/L)   CO2 18 (*) 19 - 32 (mEq/L)   Glucose, Bld 143 (*) 70 - 99 (mg/dL)   BUN 11  6 - 23 (mg/dL)   Creatinine, Ser 1.67 (*) 0.50 - 1.10 (mg/dL)   Calcium 8.3 (*) 8.4 - 10.5 (mg/dL)   GFR calc non Af Amer 33 (*) >90 (mL/min)   GFR calc Af Amer 38 (*) >90 (mL/min)  GLUCOSE, CAPILLARY     Status: Abnormal   Collection Time   09/30/11  5:28 AM      Component Value Range   Glucose-Capillary 119 (*) 70 - 99 (mg/dL)   Comment 1 Documented in Chart     Comment 2 Notify RN      Signed: Ivor Costa, MD PGY I, Internal Medicine Resident

## 2011-09-29 NOTE — Progress Notes (Signed)
Internal Medicine Attending  Date: 09/29/2011  Patient name: [name] [name] Medical record number: [id number] Date of birth: 11/09/1950 Age: 60 y.o. Gender: female   I saw and evaluated the patient. I reviewed the resident's note by Dr. Blaine Hamper and I agree with the resident's findings and plans as documented in his note.  Flexible sigmoidoscopy today showed changes of colitis near the splenic flexure most typical of ischemic colitis; biopsies were sent.  Plan is to advance diet and continue metronidazole as per GI; await results of biopsy.   Dr. Lynnae January will cover as attending physician on 09/30/2011, and Dr. Stann Mainland will take over as attending physician on January 2.

## 2011-09-29 NOTE — Progress Notes (Signed)
Subjective:  Patient feels better than yesterday. Her abdominal pain is less severe than yesterday. She had 3 BM without blood in it overnight. No fever and chills.  Lab: C diff PCR positive.   Objective: Vital signs in last 24 hours: Filed Vitals:   09/29/11 0500 09/29/11 0600 09/29/11 0700 09/29/11 0831  BP: 126/56 186/71 160/51 160/77  Pulse: 68 62 68 77  Temp:    98.2 F (36.8 C)  TempSrc:    Oral  Resp: 19 19 17 14   Height:      Weight: 165 lb 5.5 oz (75 kg)     SpO2: 96% 99% 98% 99%   Weight change: 0 lb (0 kg)  Intake/Output Summary (Last 24 hours) at 09/29/11 0900 Last data filed at 09/29/11 0834  Gross per 24 hour  Intake   2030 ml  Output   2501 ml  Net   -471 ml   Physical Exam: General: resting in bed, not in acute distress HEENT: PERRL, EOMI, no scleral icterus Cardiac: S1/S2, RRR, No murmurs, gallops or rubs Pulm: Good air movement bilaterally, Clear to auscultation bilaterally, No rales, wheezing, rhonchi or rubs. Abd: Soft,  Non-distended, tender over lower quadrant and left middle quadrant, which is less severe than yesterday. no rebound pain, no organomegaly, BS present Ext: No rashes or edema, 2+DP/PT pulse bilaterally. Lost two toes on the left foot (second and third toes).   Neuro: alert and oriented X3, cranial nerves II-XII grossly intact, muscle strength 5/5 in all extremeties,  sensation to light touch intact except for decreased sensation on the right lower leg.   Lab Results: Basic Metabolic Panel:  Lab [id number] 0520 09/28/11 0602  NA 139 140  K 4.0 4.0  CL 114* 116*  CO2 18* 15*  GLUCOSE 92 80  BUN 9 11  CREATININE 1.56* 1.71*  CALCIUM 8.4 8.2*  MG -- --  PHOS -- --   Liver Function Tests:  Lab 09/26/11 0645  AST 21  ALT 12  ALKPHOS 102  BILITOT 0.4  PROT 7.6  ALBUMIN 3.5   CBC:  Lab 09/29/11 0520 09/29/11 0017  WBC 8.4 11.7*  NEUTROABS -- --  HGB 9.9* 10.4*  HCT 30.0* 31.5*  MCV 91.5 90.5  PLT 101* 117*   Cardiac  Enzymes:  Lab 09/27/11 1000 09/26/11 2324 09/26/11 1603  CKTOTAL 49 52 69  CKMB 2.7 2.6 3.3  CKMBINDEX -- -- --  TROPONINI <0.30 <0.30 <0.30   CBG:  Lab 09/29/11 0816 09/29/11 0406 09/29/11 0033 09/28/11 2110 09/28/11 1629 09/28/11 1234  GLUCAP 79 87 94 126* 136* 77   Coagulation:  Lab 09/26/11 1552  LABPROT 14.3  INR 1.09   Anemia Panel: Urine Drug Screen: Drugs of Abuse     Component Value Date/Time   LABOPIA NEGATIVE 10/30/2009 0458   COCAINSCRNUR NEGATIVE 10/30/2009 0458   LABBENZ NEGATIVE 10/30/2009 0458   AMPHETMU NEGATIVE 10/30/2009 0458      Micro Results: Recent Results (from the past 240 hour(s))  MRSA PCR SCREENING     Status: Normal   Collection Time   09/26/11  5:49 PM      Component Value Range Status Comment   MRSA by PCR NEGATIVE  NEGATIVE  Final   CLOSTRIDIUM DIFFICILE BY PCR     Status: Abnormal   Collection Time   09/28/11  4:59 AM      Component Value Range Status Comment   C difficile by pcr POSITIVE (*) NEGATIVE  Final   STOOL  CULTURE     Status: Normal (Preliminary result)   Collection Time   09/28/11  4:59 AM      Component Value Range Status Comment   Specimen Description STOOL   Final    Special Requests Normal   Final    Culture NO SUSPICIOUS COLONIES, CONTINUING TO HOLD   Final    Report Status PENDING   Incomplete    Studies/Results: No results found. Medications:  Scheduled Meds:   . gabapentin  300 mg Oral TID  . metroNIDAZOLE  500 mg Oral Q8H  . polyethylene glycol  17 g Oral Q1 Hr x 2  . polyethylene glycol  17 g Oral Q1 Hr x 2  . simvastatin  40 mg Oral Daily  . travoprost (benzalkonium)  1 drop Both Eyes QHS  . Vitamin D (Ergocalciferol)  50,000 Units Oral Q7 days   Continuous Infusions:   . sodium chloride 75 mL/hr at 09/29/11 0406   PRN Meds:.insulin aspart, morphine, ondansetron (ZOFRAN) IV, ondansetron  Assessment/Plan: 60 y/o w with pmh of CAD (EF 55%), h/o sinus arrest with pacemaker, HTN, DM, chronic diarrhea  (unknown cause) comes to the Ed with BRBPR, abdmonial pain and vomiting. Also has dizziness.  1. Hematochezia- Likely due to C diff colitis (patient's C diff PCR came back positive on 12/30). DD include ischemic colitis, diverticulitis and hemorrhoids. GI will proceed with flexible sigmoidoscopy or colonoscopy today to confirm the diagnosis. Patient was started with Flagyl 500 mg Tid po on 12/30. She feels better. No fever. Had 3 BM without blood in it. Hb is stable (9.9 today).  - appreciate GI input, will follow up new recs -will continue Flagyl. -will continue IV fluid at 75 cc/hour -check CBC and BMET - morphine prn for pain    2. T wave inversion in lateral leads- new from previous EKG change. Asymptomatic. Troponin negative X 3. Will monitor.  3. DM- SSI and lantus (hold until npo)   4. HTN- He bp is 160/77. Will start home medications (Coreg and lisinopril) and hold Amlodipine after proceture.  5. HLD- continue statin when not npo   6. DVT Px- SCD    7 Code- full code    LOS: 2 days   Ivor Costa 09/28/2011, 11:55 AM    LOS: 3 days   Irelyn Perfecto 09/29/2011, 9:00 AM

## 2011-09-29 NOTE — Brief Op Note (Signed)
Please see [facility] note dated 09/29/2011.

## 2011-09-30 LAB — GLUCOSE, CAPILLARY: Glucose-Capillary: 109 mg/dL — ABNORMAL HIGH (ref 70–99)

## 2011-09-30 LAB — BASIC METABOLIC PANEL
BUN: 11 mg/dL (ref 6–23)
CO2: 18 mEq/L — ABNORMAL LOW (ref 19–32)
Calcium: 8.3 mg/dL — ABNORMAL LOW (ref 8.4–10.5)
Chloride: 112 mEq/L (ref 96–112)
Creatinine, Ser: 1.67 mg/dL — ABNORMAL HIGH (ref 0.50–1.10)

## 2011-09-30 LAB — CBC
HCT: 30 % — ABNORMAL LOW (ref 36.0–46.0)
MCH: 29.7 pg (ref 26.0–34.0)
MCV: 90.1 fL (ref 78.0–100.0)
RDW: 13.7 % (ref 11.5–15.5)
WBC: 7.1 10*3/uL (ref 4.0–10.5)

## 2011-09-30 MED ORDER — METRONIDAZOLE 500 MG PO TABS: 500.0000 mg | ORAL_TABLET | Freq: Three times a day (TID) | ORAL | Status: AC

## 2011-09-30 NOTE — Plan of Care (Signed)
Problem: Phase III Progression Outcomes Goal: Discharge plan remains appropriate-arrangements made Outcome: Completed/Met Date Met:  09/30/11 Home with sister  Problem: Discharge Progression Outcomes Goal: [place] arrangements in place Outcome: Completed/Met Date Met:  09/30/11 Will resume Home Visits in place before this admission.

## 2011-10-01 ENCOUNTER — Encounter (HOSPITAL_COMMUNITY): Payer: Self-pay | Admitting: Gastroenterology

## 2011-10-01 LAB — STOOL CULTURE

## 2011-10-09 ENCOUNTER — Ambulatory Visit (INDEPENDENT_AMBULATORY_CARE_PROVIDER_SITE_OTHER): Payer: Medicaid Other | Admitting: Internal Medicine

## 2011-10-09 ENCOUNTER — Encounter: Payer: Self-pay | Admitting: Internal Medicine

## 2011-10-09 DIAGNOSIS — N183 Chronic kidney disease, stage 3 unspecified: Secondary | ICD-10-CM

## 2011-10-09 DIAGNOSIS — E109 Type 1 diabetes mellitus without complications: Secondary | ICD-10-CM

## 2011-10-09 DIAGNOSIS — K559 Vascular disorder of intestine, unspecified: Secondary | ICD-10-CM

## 2011-10-09 DIAGNOSIS — D649 Anemia, unspecified: Secondary | ICD-10-CM

## 2011-10-09 HISTORY — DX: Vascular disorder of intestine, unspecified: K55.9

## 2011-10-09 LAB — CBC WITH DIFFERENTIAL/PLATELET
Basophils Absolute: 0 10*3/uL (ref 0.0–0.1)
Eosinophils Relative: 6 % — ABNORMAL HIGH (ref 0–5)
Lymphocytes Relative: 28 % (ref 12–46)
Lymphs Abs: 1.8 10*3/uL (ref 0.7–4.0)
Neutrophils Relative %: 59 % (ref 43–77)
Platelets: 227 10*3/uL (ref 150–400)
RBC: 4.03 MIL/uL (ref 3.87–5.11)
RDW: 13.7 % (ref 11.5–15.5)
WBC: 6.6 10*3/uL (ref 4.0–10.5)

## 2011-10-09 LAB — BASIC METABOLIC PANEL
BUN: 16 mg/dL (ref 6–23)
Calcium: 9.6 mg/dL (ref 8.4–10.5)
Creat: 1.63 mg/dL — ABNORMAL HIGH (ref 0.50–1.10)
Glucose, Bld: 158 mg/dL — ABNORMAL HIGH (ref 70–99)

## 2011-10-09 NOTE — Progress Notes (Signed)
Subjective:     Patient ID: [name] [name], female   DOB: 10-10-1950, 61 y.o.   MRN: [id number]  HPI The patient is a pleasant 61 year old woman who was recently discharged on 09/30/11, having been admitted for C. difficile and ischemic colitis. She is here for hospital followup. Her chronic problems include Patient Active Problem List  Diagnoses  . IDDM  . DIABETIC  RETINOPATHY  . DIABETIC PERIPHERAL NEUROPATHY  . HYPERPARATHYROIDISM, SECONDARY  . HYPERLIPIDEMIA  . ANEMIA, NORMOCYTIC  . TOBACCO USER  . CARPAL TUNNEL SYNDROME  . HYPERTENSION  . PERIPHERAL VASCULAR DISEASE  . CHRONIC KIDNEY DISEASE STAGE III (MODERATE)  . FOOT ULCER  . PACEMAKER, PERMANENT  . Sinus arrest  . Tension headache  . Dizziness  . Chronic Diarrhea  . Chills (without fever)  . Hematochezia   Since leaving the hospital her abdominal pain has improved, though she still continues to complain of intermittent achy diffuse pain, sometimes localized to the left lower quadrant. Her diarrhea has resolved: she has one bowel movement every other day with no blood. Her belly sometimes hurts worse with food, sometimes not. She continues to take Flagyl, and she will finish in 4 days.  Review of Systems     Objective:   Physical Exam GEN: NAD.  Alert and oriented x 3.  Pleasant, conversant, and cooperative to exam. RESP:  CTAB, no w/r/r CARDIOVASCULAR: RRR, S1, S2, no m/r/g ABDOMEN: soft, diffuse mild TTP, mild distension EXT: warm and dry. No edema in b/l LE     Assessment:         Plan:

## 2011-10-09 NOTE — Assessment & Plan Note (Signed)
Patient currently on sliding scale only, not taking Lantus. Sugars have been recently controlled without Lantus. Given last A1c of 5.3, I have no problems with this current plan.

## 2011-10-09 NOTE — Assessment & Plan Note (Addendum)
Patient here for hospital followup, after admission for ischemic colitis and C. difficile. Patient continues to take Flagyl with good resolution of watery diarrhea. No blood noted. Patient does continue to have achy pain, sometimes with eating, some abdominal distention, with no fevers or chills. She does have some tenderness on exam. As part of the requested followup, CBC and BMP were obtained today, and showed continued and full resolution of leukocytosis, hemoglobin consistent with prior, and creatinine consistent with prior.  I think it'll take her some time to continue healing from this episode of both C. difficile and ischemic colitis, and her abdominal pain is not overwhelming, her diarrhea has resolved, and her labs are within normal limits. Dr. Erlinda Hong note did say that full resolution of ischemic colitis can take a couple of weeks. She may also transition into chronic ischemic colitis, but that remains to be determined.  We will follow her up next week, and if she has continued pain, she may need GI referral. This can continue to be managed as an outpatient for now.

## 2011-10-10 NOTE — Progress Notes (Signed)
I agree with Dr. Griffin Dakin assessment and plan.

## 2011-10-17 ENCOUNTER — Ambulatory Visit (INDEPENDENT_AMBULATORY_CARE_PROVIDER_SITE_OTHER): Payer: Medicaid Other | Admitting: Internal Medicine

## 2011-10-17 VITALS — BP 124/65 | HR 70 | Temp 97.1°F | Ht 63.5 in | Wt 153.9 lb

## 2011-10-17 DIAGNOSIS — M19019 Primary osteoarthritis, unspecified shoulder: Secondary | ICD-10-CM

## 2011-10-17 DIAGNOSIS — K559 Vascular disorder of intestine, unspecified: Secondary | ICD-10-CM

## 2011-10-17 DIAGNOSIS — M25511 Pain in right shoulder: Secondary | ICD-10-CM | POA: Insufficient documentation

## 2011-10-17 NOTE — Progress Notes (Signed)
agree

## 2011-10-17 NOTE — Progress Notes (Signed)
Subjective:     Patient ID: CARSYNN LEAPHART, female   DOB: 1951-04-22, 61 y.o.   MRN: [id number]  HPI Agent is a 61 year old woman with multiple medical problems including diabetes, hypertension, hyperlipidemia, and most recently ischemic colitis with C. difficile status post admission over the new year. She was here for followup last week with continued abdominal pain, so we are seeing her again one week later.  She now reports resolution of her abdominal symptoms, with no diarrhea, no blood, eating and drinking as usual. She has followup with Dr. Paulita Fujita in February.  Today, however, she complains of acute right shoulder pain. There was no inciting event, no trauma, but now she explains she has exquisite pain with any movement at all. She says she has a history of bursitis nearly 20 years ago, but that has not been a problem since.  Review of Systems As per history of present illness    Objective:   Physical Exam GEN: NAD.  Alert and oriented x 3.  Pleasant, conversant, and cooperative to exam. Abd: obese, no TTP, NABS R shoulder: very TTP over [place] joint diffusely.  Limited active and passive ROM, difficult to get good exam 2/2 pain.     Assessment:         Plan:

## 2011-10-17 NOTE — Assessment & Plan Note (Signed)
Since last visit 8 days ago patient's abdominal pain has improved. She continues to have no diarrhea or hematochezia, but now she is eating and drinking better, and has no pain. Her labs from last visit showed a WBC count down to 6.6, a Hb of 11.9, which is good for this patient, and a stable BMP. These labs are very reassuring, and along the patient's symptomatic resolution, I feel she is doing well. She has followup with Dr. Paulita Fujita in the middle of February, at which time she can discuss screening colonoscopy or any other followup necessary.

## 2011-10-17 NOTE — Assessment & Plan Note (Signed)
Patient presents with exquisite right shoulder pain, diffuse tenderness to palpation, limited active and passive range of motion, with no inciting event or trauma. It is difficult to get a good exam because of the pain. I discussed with the patient that if she keeps her shoulder immobilized, she is in danger of developing a frozen shoulder. I showed her some basic mobilization exercises. We discussed taking ibuprofen and Tylenol for pain relief until she can be seen by sports medicine: we arranged for an appointment on 10/21/11 at 4 PM.   She may indeed have bursitis, as she said she had in the past, though without a good exam and is difficult to say for sure. Pathologies that may give such exquisite tenderness to palpation good CRPS or gout, but I do not think either of these the apply in this situation. - Continue to mobilize shoulder as tolerated - Tylenol and ibuprofen for pain relief - Sports medicine appointment on Tuesday 1/22

## 2011-10-20 ENCOUNTER — Other Ambulatory Visit: Payer: Self-pay | Admitting: Internal Medicine

## 2011-10-21 ENCOUNTER — Other Ambulatory Visit: Payer: Self-pay | Admitting: *Deleted

## 2011-10-21 ENCOUNTER — Ambulatory Visit (INDEPENDENT_AMBULATORY_CARE_PROVIDER_SITE_OTHER): Payer: Medicaid Other | Admitting: Family Medicine

## 2011-10-21 VITALS — BP 106/64

## 2011-10-21 DIAGNOSIS — M7580 Other shoulder lesions, unspecified shoulder: Secondary | ICD-10-CM

## 2011-10-21 DIAGNOSIS — M67919 Unspecified disorder of synovium and tendon, unspecified shoulder: Secondary | ICD-10-CM

## 2011-10-21 DIAGNOSIS — M259 Joint disorder, unspecified: Secondary | ICD-10-CM

## 2011-10-21 DIAGNOSIS — M19019 Primary osteoarthritis, unspecified shoulder: Secondary | ICD-10-CM

## 2011-10-21 MED ORDER — OXYCODONE-ACETAMINOPHEN 5-325 MG PO TABS: ORAL_TABLET | ORAL | Status: DC

## 2011-10-21 NOTE — Telephone Encounter (Signed)
Reviewing meds

## 2011-10-21 NOTE — Telephone Encounter (Signed)
Pain med filled by dr Edilia Bo

## 2011-10-22 NOTE — Progress Notes (Signed)
  Patient Name: [name] [name] Date of Birth: 1951/09/15 Age: 61 y.o. Medical Record Number: [id number] Gender: female Date of Encounter: 10/21/2011  History of Present Illness:  ROCELIA DAGRACA is a 61 y.o. very pleasant female patient who presents with the following:  Challenging patient from the internal medicine service who presents for a consult for right shoulder pain. She was recently discharged with colitis, and it appears that she did have a C. difficile positive colitis. The notes also suggest a potential ischemic colitis. The patient also has an open diabetic wound on one of her feet, and has multiple medical comorbidities.  She did not have any specific injury to her shoulder. No prior fractures, trauma, dislocations. No prior surgeries. About a week or so ago she started to have more shoulder pain, and is having some difficulty and pain with abduction and reaching across her body.  Past Medical History, Surgical History, Social History, Family History, Problem List, Medications, and Allergies have been reviewed and updated if relevant.  Review of Systems: Complex recent history. Hospital discharge was reviewed. No numbness and tingling in her upper extremity. Diabetic ulcer. Recent hospitalization with GI bleed. Colitis.  Physical Examination: Filed Vitals:   10/21/11 1638  BP: 106/64    There is no height or weight on file to calculate BMI.   GEN: WDWN, NAD, Non-toxic, Alert & Oriented x 3 HEENT: Atraumatic, Normocephalic.  Ears and Nose: No external deformity. NEURO: Normal gait.  PSYCH: Normally interactive. Conversant. Not depressed or anxious appearing.  Calm demeanor.   Right shoulder: Full passive abduction and flexion. Mild degree of loss of internal range of motion compared to the contralateral side. This is comparing right to left. Strength is preserved and remains 5/5. External rotation is 4+/5. Notably tender at the acromioclavicular joint. Mildly tender in the  bicipital groove. Job test is mildly positive. Neer test and Michel Bickers test is positive. The patient actually has a mildly positive Michel Bickers test and near test on the left shoulder as well. Negative sulcus sign. Negative drop sign. Assessment and Plan:  1. AC joint arthropathy   2. Rotator cuff tendonitis     Likely a.c. joint arthropathy flare with also some associated rotator cuff tendinitis. Multiple comorbidities make the case more challenging. The patient does have an open diabetic ulcer currently and was recently discharged from the hospital.  Symptoms have been ongoing for one week. For now we will begin some conservative management. She does have some chronic kidney disease, so I would caution and stay away from NSAIDs. I have given her some Percocet. She will use these sparingly and do prior to physical therapy.  Recheck in 4-6 weeks

## 2011-10-30 NOTE — Telephone Encounter (Signed)
Opened in error

## 2011-11-12 ENCOUNTER — Other Ambulatory Visit: Payer: Self-pay | Admitting: Internal Medicine

## 2011-11-28 ENCOUNTER — Other Ambulatory Visit: Payer: Self-pay | Admitting: Internal Medicine

## 2011-12-02 ENCOUNTER — Encounter: Payer: Self-pay | Admitting: Vascular Surgery

## 2011-12-03 ENCOUNTER — Encounter: Payer: Self-pay | Admitting: Vascular Surgery

## 2011-12-03 ENCOUNTER — Ambulatory Visit (INDEPENDENT_AMBULATORY_CARE_PROVIDER_SITE_OTHER): Payer: Medicaid Other | Admitting: Vascular Surgery

## 2011-12-03 ENCOUNTER — Encounter (INDEPENDENT_AMBULATORY_CARE_PROVIDER_SITE_OTHER): Payer: Medicaid Other | Admitting: *Deleted

## 2011-12-03 VITALS — BP 185/70 | HR 60 | Temp 98.1°F | Ht 62.0 in | Wt 157.0 lb

## 2011-12-03 DIAGNOSIS — L97909 Non-pressure chronic ulcer of unspecified part of unspecified lower leg with unspecified severity: Secondary | ICD-10-CM

## 2011-12-03 DIAGNOSIS — I70229 Atherosclerosis of native arteries of extremities with rest pain, unspecified extremity: Secondary | ICD-10-CM

## 2011-12-03 DIAGNOSIS — Z48812 Encounter for surgical aftercare following surgery on the circulatory system: Secondary | ICD-10-CM

## 2011-12-03 DIAGNOSIS — M79609 Pain in unspecified limb: Secondary | ICD-10-CM

## 2011-12-03 DIAGNOSIS — I7025 Atherosclerosis of native arteries of other extremities with ulceration: Secondary | ICD-10-CM | POA: Insufficient documentation

## 2011-12-03 DIAGNOSIS — I739 Peripheral vascular disease, unspecified: Secondary | ICD-10-CM

## 2011-12-03 DIAGNOSIS — L98499 Non-pressure chronic ulcer of skin of other sites with unspecified severity: Secondary | ICD-10-CM

## 2011-12-03 HISTORY — DX: Peripheral vascular disease, unspecified: L98.499

## 2011-12-03 HISTORY — DX: Peripheral vascular disease, unspecified: I73.9

## 2011-12-03 NOTE — Progress Notes (Signed)
Addended by: Mena Goes on: 12/03/2011 03:28 PM   Modules accepted: Orders
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
## 2011-12-05 ENCOUNTER — Other Ambulatory Visit: Payer: Self-pay | Admitting: Vascular Surgery

## 2011-12-05 LAB — BUN: BUN: 22 mg/dL (ref 6–23)

## 2011-12-17 ENCOUNTER — Ambulatory Visit
Admission: RE | Admit: 2011-12-17 | Discharge: 2011-12-17 | Disposition: A | Discharge status: Home / Self Care | Payer: Medicaid Other | Source: Ambulatory Visit | Attending: Vascular Surgery | Admitting: Vascular Surgery

## 2011-12-17 DIAGNOSIS — I739 Peripheral vascular disease, unspecified: Secondary | ICD-10-CM

## 2011-12-17 DIAGNOSIS — L98499 Non-pressure chronic ulcer of skin of other sites with unspecified severity: Secondary | ICD-10-CM

## 2011-12-17 MED ORDER — IOHEXOL 350 MG/ML SOLN
115.0000 mL | Freq: Once | INTRAVENOUS | Status: AC | PRN
  Administered 2011-12-17: 115 mL via INTRAVENOUS

## 2011-12-17 NOTE — Procedures (Unsigned)
BYPASS GRAFT EVALUATION  INDICATION:  Bilateral lower extremity bypass graft  HISTORY: Diabetes:  Yes Cardiac:  No Hypertension:  Yes Smoking:  Yes Previous Surgery:  Left fem-pop bypass graft in 2002 with angioplasty in 2005, right fem-pop bypass graft in 2003 by Dr. Allean Found  SINGLE LEVEL ARTERIAL EXAM                              RIGHT              LEFT Brachial: Anterior tibial: Posterior tibial: Peroneal: Ankle/brachial index:  PREVIOUS ABI:  Date:  RIGHT:  LEFT:  LOWER EXTREMITY BYPASS GRAFT DUPLEX EXAM:  DUPLEX:  Monophasic/biphasic Doppler waveforms noted throughout the bilateral lower extremity bypass grafts.  Velocities of 222 cm/s and 326 cm/s noted in the right distal native vessel and left distal native vessel, respectively.  IMPRESSION: 1. Patent bilateral femoral-popliteal bypass grafts with velocities of     the distal native vessels as described above. 2. Bilateral ankle brachial indices are noted on a separate report.  ___________________________________________ Judeth Cornfield. Scot Dock, M.D.  CH/MEDQ  D:  12/05/2011  T:  12/05/2011  Job:  [id number]

## 2011-12-25 ENCOUNTER — Encounter: Payer: Medicaid Other | Admitting: Internal Medicine

## 2011-12-30 ENCOUNTER — Other Ambulatory Visit: Payer: Self-pay | Admitting: *Deleted

## 2011-12-31 MED ORDER — VITAMIN D (ERGOCALCIFEROL) 1.25 MG (50000 UNIT) PO CAPS: 50000.0000 [IU] | ORAL_CAPSULE | ORAL | Status: DC

## 2012-01-02 ENCOUNTER — Encounter: Payer: Medicaid Other | Admitting: Internal Medicine

## 2012-01-07 ENCOUNTER — Ambulatory Visit: Payer: Medicaid Other | Admitting: Vascular Surgery

## 2012-01-22 ENCOUNTER — Other Ambulatory Visit: Payer: Self-pay | Admitting: Internal Medicine

## 2012-01-22 ENCOUNTER — Other Ambulatory Visit: Payer: Self-pay | Admitting: *Deleted

## 2012-01-22 DIAGNOSIS — E109 Type 1 diabetes mellitus without complications: Secondary | ICD-10-CM

## 2012-01-22 MED ORDER — ISOSORBIDE MONONITRATE ER 30 MG PO TB24: 30.0000 mg | ORAL_TABLET | Freq: Every day | ORAL | Status: DC

## 2012-01-22 NOTE — Telephone Encounter (Signed)
Also needs refill on Amlodipine besylate 10 mg  One tablet daily - last refill 09/18/11 and Prodigy insulin syringe 1/58ml/ 31g x 5/16" - all top [employer].

## 2012-01-22 NOTE — Telephone Encounter (Signed)
Patient has not been seen in clinic for HTN & DM in months.  I will fill imdur without without refills.  Please schedule for HTN & DM follow up.

## 2012-01-27 ENCOUNTER — Encounter: Payer: Self-pay | Admitting: Vascular Surgery

## 2012-01-27 ENCOUNTER — Encounter: Payer: Medicaid Other | Admitting: Internal Medicine

## 2012-01-28 ENCOUNTER — Encounter: Payer: Self-pay | Admitting: Vascular Surgery

## 2012-01-28 ENCOUNTER — Ambulatory Visit (INDEPENDENT_AMBULATORY_CARE_PROVIDER_SITE_OTHER): Payer: Medicaid Other | Admitting: Vascular Surgery

## 2012-01-28 VITALS — BP 157/85 | HR 61 | Temp 98.2°F | Ht 62.0 in | Wt 155.0 lb

## 2012-01-28 DIAGNOSIS — I70219 Atherosclerosis of native arteries of extremities with intermittent claudication, unspecified extremity: Secondary | ICD-10-CM

## 2012-01-28 HISTORY — DX: Atherosclerosis of native arteries of extremities with intermittent claudication, unspecified extremity: I70.219

## 2012-01-28 NOTE — Progress Notes (Signed)
[facility] [place]  Patient name: [name] [name] MRN: [id number] DOB: [birth year] Sex: female  REASON FOR VISIT: follow up of left foot wound.  HPI: [name] [name] is a 61 y.o. female who I had recently seen on 12/03/2011 with a wound on her left foot which is followed by Dr. Amalia Hailey. She has had x-rays which did not show any evidence of osteomyelitis. However given that this wound has been chronic and she had evidence of tibial artery occlusive disease below her bypass we discussed obtaining a CT angiogram. Transfer follow up visit today however the CT angiogram had not been obtained. She's not had any significant pain in her left foot. She's had no fever or chills. She's had no drainage from the wound on the plantar aspect of her left foot.   REVIEW OF SYSTEMS: Valu.Nieves ] denotes positive finding; [  ] denotes negative finding  CARDIOVASCULAR:  [ ]  chest pain   [ ]  dyspnea on exertion    CONSTITUTIONAL:  [ ]  fever   [ ]  chills  PHYSICAL EXAM: Filed Vitals:   01/28/12 1621  BP: 157/85  Pulse: 61  Temp: 98.2 F (36.8 C)  TempSrc: Oral  Height: 5\' 2"  (1.575 m)  Weight: 155 lb (70.308 kg)  SpO2: 97%   Body mass index is 28.35 kg/(m^2). GENERAL: The patient is a well-nourished female, in no acute distress. The vital signs are documented above. CARDIOVASCULAR: There is a regular rate and rhythm  PULMONARY: There is good air exchange bilaterally without wheezing or rales. She has palpable femoral pulses bilaterally. On the right side she has a palpable popliteal dorsalis pedis and posterior tibial pulse. On the left side cannot palpate pedal pulses. She has a wound on the plantar aspect of her left foot with no drainage or erythema appeared  MEDICAL ISSUES: Given that the wound has remained stable we will hold off on the CT angiogram and less she develops increasing pain in the foot or progression of the wound. I plan on seeing her back in 4 months. She knows to call sooner  she has problems. If she does show any progression of the wound she knows to call and we will simply schedule a CT angiogram with runoff to determine the extent of her distal tibial artery occlusive disease. Based on her Doppler studies both bypass grafts are patent but she developed some tibial disease below her bypass graft on the left.  [facility] [place] Beeper: [phone]

## 2012-02-06 ENCOUNTER — Encounter: Payer: Self-pay | Admitting: Internal Medicine

## 2012-02-09 ENCOUNTER — Encounter: Payer: Self-pay | Admitting: Internal Medicine

## 2012-02-09 ENCOUNTER — Ambulatory Visit (INDEPENDENT_AMBULATORY_CARE_PROVIDER_SITE_OTHER): Payer: Medicaid Other | Admitting: Internal Medicine

## 2012-02-09 VITALS — BP 136/84 | HR 61 | Temp 97.0°F | Wt 145.4 lb

## 2012-02-09 DIAGNOSIS — I10 Essential (primary) hypertension: Secondary | ICD-10-CM

## 2012-02-09 DIAGNOSIS — E1142 Type 2 diabetes mellitus with diabetic polyneuropathy: Secondary | ICD-10-CM

## 2012-02-09 DIAGNOSIS — M25511 Pain in right shoulder: Secondary | ICD-10-CM

## 2012-02-09 DIAGNOSIS — J309 Allergic rhinitis, unspecified: Secondary | ICD-10-CM

## 2012-02-09 DIAGNOSIS — F17200 Nicotine dependence, unspecified, uncomplicated: Secondary | ICD-10-CM

## 2012-02-09 DIAGNOSIS — E109 Type 1 diabetes mellitus without complications: Secondary | ICD-10-CM

## 2012-02-09 DIAGNOSIS — E213 Hyperparathyroidism, unspecified: Secondary | ICD-10-CM

## 2012-02-09 DIAGNOSIS — E785 Hyperlipidemia, unspecified: Secondary | ICD-10-CM

## 2012-02-09 DIAGNOSIS — R197 Diarrhea, unspecified: Secondary | ICD-10-CM

## 2012-02-09 DIAGNOSIS — D649 Anemia, unspecified: Secondary | ICD-10-CM

## 2012-02-09 DIAGNOSIS — E211 Secondary hyperparathyroidism, not elsewhere classified: Secondary | ICD-10-CM

## 2012-02-09 DIAGNOSIS — E1149 Type 2 diabetes mellitus with other diabetic neurological complication: Secondary | ICD-10-CM

## 2012-02-09 DIAGNOSIS — K559 Vascular disorder of intestine, unspecified: Secondary | ICD-10-CM

## 2012-02-09 DIAGNOSIS — J302 Other seasonal allergic rhinitis: Secondary | ICD-10-CM | POA: Insufficient documentation

## 2012-02-09 DIAGNOSIS — Z79899 Other long term (current) drug therapy: Secondary | ICD-10-CM

## 2012-02-09 LAB — GLUCOSE, CAPILLARY: Glucose-Capillary: 103 mg/dL — ABNORMAL HIGH (ref 70–99)

## 2012-02-09 LAB — POCT GLYCOSYLATED HEMOGLOBIN (HGB A1C): Hemoglobin A1C: 4.9

## 2012-02-09 MED ORDER — CARVEDILOL 6.25 MG PO TABS: 6.2500 mg | ORAL_TABLET | Freq: Two times a day (BID) | ORAL | Status: DC

## 2012-02-09 MED ORDER — LISINOPRIL 10 MG PO TABS: 10.0000 mg | ORAL_TABLET | Freq: Every day | ORAL | Status: DC

## 2012-02-09 MED ORDER — VITAMIN D (ERGOCALCIFEROL) 1.25 MG (50000 UNIT) PO CAPS: 50000.0000 [IU] | ORAL_CAPSULE | ORAL | Status: DC

## 2012-02-09 MED ORDER — LORATADINE 10 MG PO TABS: 10.0000 mg | ORAL_TABLET | Freq: Every day | ORAL | Status: DC

## 2012-02-09 NOTE — Assessment & Plan Note (Signed)
Lab Results  Component Value Date   HGBA1C 4.9 02/09/2012   HGBA1C 5.9 07/24/2010   POCGLU 186 11/06/2010   CREATININE 1.88* 12/05/2011   CREATININE 1.67* 09/30/2011   MICROALBUR 73.45* 03/18/2010   MICRALBCREAT 546.5* 03/18/2010   CHOL 165 08/28/2011   HDL 37* 08/28/2011   TRIG 232* 08/28/2011    Last eye exam and foot exam:    Component Value Date/Time   HMDIABEYEEXA No diabetic retinopathy. Glaucoma well controlled. Cataracts, subcapsular 07/10/2009    Assessment: Diabetes control: controlled Progress toward goals: at goal Barriers to meeting goals: no barriers identified  Plan: Diabetes treatment: Discontinue Insulin; pt not candidate for metformin given CKD.  Monitor HbA1c in 3 months to see if any therapy is indicated; I suspect DM is better controlled d/t decreased PO intake d/t ischemic colitis; she also reports healthier diet.  She will continue to log CBGs about once/day Refer to: none Instruction/counseling given: reminded to bring blood glucose meter & log to each visit, reminded to bring medications to each visit and discussed diet

## 2012-02-09 NOTE — Patient Instructions (Signed)
-  Discontinue insulin, but please keep checking your blood sugar once per day.  -Discontinue Amlodipine, and restart Lisinopril 10mg  daily.  Also continue Carvedilol & Imdur.  -Please increase Gabapentin to your goal of 1 tablet three times daily.  For the next week, take 1 tablet twice daily, then increase to three times daily.  I think this will help with your hot flashes.    -Please complete your antibiotics for your foot.   -You may start taking Loratadine (Claritin) for your seasonal allergies/headaches.

## 2012-02-09 NOTE — Assessment & Plan Note (Signed)
Compliant with Simvastatin 40 qHS.  Last LDL = 82 five months ago.  No change in mgmt.

## 2012-02-09 NOTE — Assessment & Plan Note (Signed)
Is not interested in flonase.  loratadine 10 mg daily

## 2012-02-09 NOTE — Assessment & Plan Note (Signed)
Seen by Dr. Lorelei Pont. Likely AC joint arthropathy & rotator cuff tendonitis.  Treatment with Percocet as needed.  Avoid NSAIDs in setting of CKD.    Patient reports minimal use of percocet.

## 2012-02-09 NOTE — Assessment & Plan Note (Signed)
Lab Results  Component Value Date   NA 140 10/09/2011   K 4.2 10/09/2011   CL 107 10/09/2011   CO2 26 10/09/2011   BUN 22 12/05/2011   CREATININE 1.88* 12/05/2011   CREATININE 1.67* 09/30/2011    BP Readings from Last 3 Encounters:  02/09/12 136/84  01/28/12 157/85  12/03/11 185/70    Assessment: Hypertension control:  controlled  Progress toward goals:  improved Barriers to meeting goals:  no barriers identified  Plan: Hypertension treatment:  continue current medications there has been some confusion with her medications; she has with her amlodipine, imdur & coreg;  She apparently started amlodipine (old medication from med cabinet) when she ran out of lisinopril.  She has not taken meds this AM.  I have asked that she continue [place], and d/c amlodipine and restart lisinopril 10.  She is scheduled for f/u with Dr. Caryl Comes in June, and thus BP meds can be titrated at that time if needed.  She will also follow up with me in 3 months.

## 2012-02-09 NOTE — Progress Notes (Signed)
  Subjective:   Patient ID: [name] [name] female   DOB: Mar 07, 1951 61 y.o.   MRN: [id number]  HPI: [name] is a 61 y.o. woman with past medical history significant for diabetes, hypertension, ischemic colitis, coronary artery disease, and CKD who presents today for diabetes and hypertension followup.  Regarding her diabetes, she checks her blood sugars regularly and denies any significant highs or lows. She averages 112.  She denies symptoms of hyperglycemia such as polyuria, polydipsia and polyphagia. She denies symptoms of hypoglycemia such as  dizziness, shortness of breath, or diaphoresis. She did not use Lantus. She only uses findings: Sling as needed, and cannot recall the last time that she used insulin. She notes improved diet, it including more vegetables. She also notes decreased appetite d/t diarrhea and ischemic colitis.  She complains of increased hot flashes. She uses gabapentin once daily, but is written for 3 times a day.  She complains of intermittent throbbing headaches, usually posteriorly, which is common for her around allergy season. She has used loratadine in the past with relief. No changes in vision.   Review of Systems: Constitutional: Denies fever, chills, diaphoresis, appetite change and fatigue.  HEENT: Denies photophobia, eye pain, redness, hearing loss, ear pain, congestion, sore throat, rhinorrhea, sneezing, mouth sores, trouble swallowing, neck pain, neck stiffness and tinnitus.   Respiratory: Denies SOB, DOE, cough, chest tightness,  and wheezing.   Cardiovascular: Denies chest pain and leg swelling. Occasional heart palpitations without CP, but some SOB, asymptomatic recently Gastrointestinal: Denies nausea, vomiting Genitourinary: Denies dysuria, urgency, frequency, hematuria, flank pain and difficulty urinating.   Skin: Denies pallor, rash and wound.  Neurological: Denies dizziness, seizures, syncope, weakness, light-headedness, numbness.    Psychiatric/Behavioral: Denies suicidal ideation, mood changes, confusion, nervousness, sleep disturbance and agitation  Objective:  Physical Exam: Filed Vitals:   02/09/12 0926 02/09/12 1025  BP: 138/89 136/84  Pulse: 112 61  Temp: 97 F (36.1 C)   TempSrc: Oral   Weight: 145 lb 6.4 oz (65.953 kg)    Constitutional: Vital signs reviewed.  Patient is a well-developed and well-nourished woman in no acute distress and cooperative with exam.  Mouth: no erythema or exudates, MMM Eyes: PERRL, EOMI, conjunctivae normal, No scleral icterus.  Neck: No thyromegaly  Cardiovascular: mildly tachycardic (no meds this AM), S1 normal, S2 normal, no MRG, pulses symmetric and intact bilaterally Pulmonary/Chest: CTAB, no wheezes, rales, or rhonchi Abdominal: Soft. Non-tender, non-distended, bowel sounds are normal Musculoskeletal: Left foot in boot, she will not remove boot to let me inspect chronic foot ulcer, she is being followed by Dr. Amalia [name] Neurological: A&O x3 Skin: Warm, dry and intact.  Psychiatric: Normal mood and affect. speech and behavior is normal. Judgment and thought content normal. Cognition and memory are normal.   Assessment & Plan:   Case and care discussed with Dr. Lynnae [name]. Patient to return in 3 months for diabetes and hypertension followup. Please see problem-oriented charting for further details.

## 2012-02-09 NOTE — Assessment & Plan Note (Signed)
Continues to smoke.  Not interested in smoking cessation.  Counseled.

## 2012-02-09 NOTE — Assessment & Plan Note (Signed)
Likely d/t CKD.  Last Hb = 11.9 4 months ago.  Will check annually unless symptomatic.

## 2012-02-09 NOTE — Assessment & Plan Note (Signed)
Last intact PTH = 85.9 (5 months ago), decreased from 133. Likely d/t CKD. Will monitor Cr every 3-6 months.

## 2012-02-09 NOTE — Assessment & Plan Note (Signed)
Improved abd pain & diarrhea.  Last episode of diarrhea was 1 week prior.  She will follow up with Dr. Paulita Fujita on 02/25/12.

## 2012-02-09 NOTE — Assessment & Plan Note (Signed)
She is only using gabapentin once daily. I have asked that she titrate up to TID as this should help with hot flashes as well.

## 2012-02-10 LAB — MICROALBUMIN / CREATININE URINE RATIO
Creatinine, Urine: 130.7 mg/dL
Microalb Creat Ratio: 834.4 mg/g — ABNORMAL HIGH (ref 0.0–30.0)
Microalb, Ur: 109.06 mg/dL — ABNORMAL HIGH (ref 0.00–1.89)

## 2012-02-25 ENCOUNTER — Encounter: Payer: Self-pay | Admitting: *Deleted

## 2012-03-03 ENCOUNTER — Other Ambulatory Visit: Payer: Self-pay | Admitting: Internal Medicine

## 2012-03-04 NOTE — Telephone Encounter (Signed)
Rx called into pharmacy - their computers were down.

## 2012-03-05 ENCOUNTER — Encounter: Payer: Self-pay | Admitting: Internal Medicine

## 2012-03-05 ENCOUNTER — Ambulatory Visit (INDEPENDENT_AMBULATORY_CARE_PROVIDER_SITE_OTHER): Payer: Medicaid Other | Admitting: Internal Medicine

## 2012-03-05 VITALS — BP 141/86 | HR 74 | Ht 62.0 in | Wt 148.0 lb

## 2012-03-05 DIAGNOSIS — I455 Other specified heart block: Secondary | ICD-10-CM

## 2012-03-05 DIAGNOSIS — F17200 Nicotine dependence, unspecified, uncomplicated: Secondary | ICD-10-CM

## 2012-03-05 DIAGNOSIS — I442 Atrioventricular block, complete: Secondary | ICD-10-CM

## 2012-03-05 LAB — PACEMAKER DEVICE OBSERVATION
AL AMPLITUDE: 4.8 mv
AL IMPEDENCE PM: 462.5 Ohm
AL THRESHOLD: 0.75 V
ATRIAL PACING PM: 69
BAMS-0001: 160 {beats}/min
BATTERY VOLTAGE: 2.9629 V
DEVICE MODEL PM: 7114075
RV LEAD IMPEDENCE PM: 600 Ohm
RV LEAD THRESHOLD: 0.75 V
VENTRICULAR PACING PM: 87

## 2012-03-05 NOTE — Assessment & Plan Note (Signed)
Progression of his conduction system disease now at 97% ventricular pacing. We will shorten the AV delay
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
## 2012-03-05 NOTE — Patient Instructions (Signed)
Your physician wants you to follow-up in: 6 months with [place] 1 year with Dr. Caryl Comes. You will receive a reminder letter in the mail two months in advance. If you don't receive a letter, please call our office to schedule the follow-up appointment.  Your physician recommends that you continue on your current medications as directed. Please refer to the Current Medication list given to you today.

## 2012-03-05 NOTE — Assessment & Plan Note (Signed)
Stable. She is 95% atrially paceda

## 2012-03-05 NOTE — Assessment & Plan Note (Addendum)
Encouraged her again to stop smoking; she only does it 3 or 4 cigarettes a day and recommended consideration of lozenges or guma

## 2012-03-08 ENCOUNTER — Other Ambulatory Visit: Payer: Self-pay | Admitting: Internal Medicine

## 2012-03-16 ENCOUNTER — Encounter: Payer: Self-pay | Admitting: Vascular Surgery

## 2012-03-17 ENCOUNTER — Ambulatory Visit (INDEPENDENT_AMBULATORY_CARE_PROVIDER_SITE_OTHER): Payer: Medicaid Other | Admitting: Vascular Surgery

## 2012-03-17 ENCOUNTER — Encounter: Payer: Self-pay | Admitting: Vascular Surgery

## 2012-03-17 VITALS — BP 144/90 | HR 64 | Temp 97.9°F | Ht 62.0 in | Wt 147.0 lb

## 2012-03-17 DIAGNOSIS — I70219 Atherosclerosis of native arteries of extremities with intermittent claudication, unspecified extremity: Secondary | ICD-10-CM

## 2012-03-17 NOTE — Progress Notes (Signed)
[facility] [place]  Patient name: [name] [name] MRN: [id number] DOB: 08-20-1951 Sex: female  REASON FOR VISIT: all of left foot wound and tibial artery occlusive disease.  HPI: [name] [name] is a 61 y.o. female who is undergone bilateral femoropopliteal bypass grafts. She has had a wound on the plantar aspect of her left foot for 2 years. She underwent a CT angiogram on 12/17/2011 to evaluate her circulation. Duplex scan had previously confirmed both of her bypass grafts were patent but I suspected that she had progression of her distal tibial artery occlusive disease. She comes in today to discuss results of her CT scan.  She states that the foot wound has improved significantly and currently she is really not had any significant drainage. Previous x-rays did not show any evidence of osteomyelitis. I do not get any clear-cut history of claudication or rest pain.   REVIEW OF SYSTEMS: Valu.Nieves ] denotes positive finding; [  ] denotes negative finding  CARDIOVASCULAR:  [ ]  chest pain   [ ]  dyspnea on exertion    CONSTITUTIONAL:  [ ]  fever   [ ]  chills  PHYSICAL EXAM: Filed Vitals:   03/17/12 1445  BP: 144/90  Pulse: 64  Temp: 97.9 F (36.6 C)  TempSrc: Oral  Height: 5\' 2"  (1.575 m)  Weight: 147 lb (66.679 kg)  SpO2: 100%   Body mass index is 26.89 kg/(m^2). GENERAL: The patient is a well-nourished female, in no acute distress. The vital signs are documented above. CARDIOVASCULAR: There is a regular rate and rhythm  PULMONARY: There is good air exchange bilaterally without wheezing or rales. She has palpable femoral pulses and a palpable right popliteal pulse. I cannot palpate pedal pulses however both feet are warm and well-perfused. The wound on the plantar aspect of her left foot is dry without any drainage.  I have reviewed her CT angiogram. The femoropopliteal bypass graft on the left is patent without any areas of narrowing noted within the graft. He appears to  be some narrowing just below the bypass graft beyond the distal anastomosis. There is also narrowing of the popliteal artery at the level of the knee. He has two-vessel runoff on the left.  MEDICAL ISSUES: If the wound on her left foot or nonhealing then we would have to consider her for possible tibial bypass. This would involve bypassing from her femoropopliteal bypass graft currently down to a tibial vessel. She likely has limited vein options because of her previous bypasses and this would not be without risk. Given her wound in her left foot appears to be doing well I recommend we hold off on any consideration for additional bypass in the left leg and less the wound becomes an issue. I'll see her back in 6 months have ordered follow duplex scan ABIs for that time. She knows to call sooner if she has problems.  [facility] [place] Beeper: [phone]

## 2012-03-26 ENCOUNTER — Other Ambulatory Visit: Payer: Self-pay | Admitting: Gastroenterology

## 2012-05-14 ENCOUNTER — Emergency Department (HOSPITAL_COMMUNITY)
Admission: EM | Admit: 2012-05-14 | Discharge: 2012-05-14 | Disposition: A | Discharge status: Home / Self Care | Payer: Medicaid Other | Attending: Emergency Medicine | Admitting: Emergency Medicine

## 2012-05-14 ENCOUNTER — Encounter (HOSPITAL_COMMUNITY): Payer: Self-pay

## 2012-05-14 ENCOUNTER — Emergency Department (HOSPITAL_COMMUNITY): Payer: Medicaid Other

## 2012-05-14 DIAGNOSIS — E1142 Type 2 diabetes mellitus with diabetic polyneuropathy: Secondary | ICD-10-CM | POA: Insufficient documentation

## 2012-05-14 DIAGNOSIS — N189 Chronic kidney disease, unspecified: Secondary | ICD-10-CM | POA: Insufficient documentation

## 2012-05-14 DIAGNOSIS — R0609 Other forms of dyspnea: Secondary | ICD-10-CM | POA: Insufficient documentation

## 2012-05-14 DIAGNOSIS — R0789 Other chest pain: Secondary | ICD-10-CM | POA: Insufficient documentation

## 2012-05-14 DIAGNOSIS — R0602 Shortness of breath: Secondary | ICD-10-CM | POA: Insufficient documentation

## 2012-05-14 DIAGNOSIS — Z79899 Other long term (current) drug therapy: Secondary | ICD-10-CM | POA: Insufficient documentation

## 2012-05-14 DIAGNOSIS — E11319 Type 2 diabetes mellitus with unspecified diabetic retinopathy without macular edema: Secondary | ICD-10-CM | POA: Insufficient documentation

## 2012-05-14 DIAGNOSIS — R0600 Dyspnea, unspecified: Secondary | ICD-10-CM

## 2012-05-14 DIAGNOSIS — J209 Acute bronchitis, unspecified: Secondary | ICD-10-CM | POA: Insufficient documentation

## 2012-05-14 DIAGNOSIS — I509 Heart failure, unspecified: Secondary | ICD-10-CM

## 2012-05-14 DIAGNOSIS — I2510 Atherosclerotic heart disease of native coronary artery without angina pectoris: Secondary | ICD-10-CM | POA: Insufficient documentation

## 2012-05-14 DIAGNOSIS — E7800 Pure hypercholesterolemia, unspecified: Secondary | ICD-10-CM | POA: Insufficient documentation

## 2012-05-14 DIAGNOSIS — E1129 Type 2 diabetes mellitus with other diabetic kidney complication: Secondary | ICD-10-CM | POA: Insufficient documentation

## 2012-05-14 DIAGNOSIS — I129 Hypertensive chronic kidney disease with stage 1 through stage 4 chronic kidney disease, or unspecified chronic kidney disease: Secondary | ICD-10-CM | POA: Insufficient documentation

## 2012-05-14 DIAGNOSIS — N058 Unspecified nephritic syndrome with other morphologic changes: Secondary | ICD-10-CM | POA: Insufficient documentation

## 2012-05-14 DIAGNOSIS — E1149 Type 2 diabetes mellitus with other diabetic neurological complication: Secondary | ICD-10-CM | POA: Insufficient documentation

## 2012-05-14 DIAGNOSIS — E1139 Type 2 diabetes mellitus with other diabetic ophthalmic complication: Secondary | ICD-10-CM | POA: Insufficient documentation

## 2012-05-14 DIAGNOSIS — R0989 Other specified symptoms and signs involving the circulatory and respiratory systems: Secondary | ICD-10-CM | POA: Insufficient documentation

## 2012-05-14 LAB — CBC WITH DIFFERENTIAL/PLATELET
Basophils Absolute: 0 10*3/uL (ref 0.0–0.1)
Basophils Relative: 0 % (ref 0–1)
Eosinophils Relative: 5 % (ref 0–5)
HCT: 43 % (ref 36.0–46.0)
Lymphocytes Relative: 38 % (ref 12–46)
MCHC: 34.4 g/dL (ref 30.0–36.0)
MCV: 94.3 fL (ref 78.0–100.0)
Monocytes Absolute: 0.4 10*3/uL (ref 0.1–1.0)
Platelets: 133 10*3/uL — ABNORMAL LOW (ref 150–400)
RDW: 13.5 % (ref 11.5–15.5)
WBC: 5.3 10*3/uL (ref 4.0–10.5)

## 2012-05-14 LAB — BASIC METABOLIC PANEL
Calcium: 9.8 mg/dL (ref 8.4–10.5)
Creatinine, Ser: 1.71 mg/dL — ABNORMAL HIGH (ref 0.50–1.10)
GFR calc Af Amer: 37 mL/min — ABNORMAL LOW (ref >90)
GFR calc non Af Amer: 32 mL/min — ABNORMAL LOW (ref >90)
Sodium: 142 mEq/L (ref 135–145)

## 2012-05-14 LAB — PRO B NATRIURETIC PEPTIDE: Pro B Natriuretic peptide (BNP): 3921 pg/mL — ABNORMAL HIGH (ref 0–125)

## 2012-05-14 LAB — TROPONIN I: Troponin I: <0.3 ng/mL (ref <0.30)

## 2012-05-14 MED ORDER — METHYLPREDNISOLONE SODIUM SUCC 125 MG IJ SOLR
125.0000 mg | Freq: Once | INTRAMUSCULAR | Status: AC
  Administered 2012-05-14: 125 mg via INTRAVENOUS
  Filled 2012-05-14: qty 2

## 2012-05-14 MED ORDER — FUROSEMIDE 10 MG/ML IJ SOLN
40.0000 mg | Freq: Once | INTRAMUSCULAR | Status: AC
  Administered 2012-05-14: 40 mg via INTRAVENOUS
  Filled 2012-05-14: qty 4

## 2012-05-14 MED ORDER — ALBUTEROL SULFATE (5 MG/ML) 0.5% IN NEBU
2.5000 mg | INHALATION_SOLUTION | RESPIRATORY_TRACT | Status: AC
  Administered 2012-05-14: 2.5 mg via RESPIRATORY_TRACT
  Filled 2012-05-14: qty 20

## 2012-05-14 MED ORDER — ALBUTEROL SULFATE (5 MG/ML) 0.5% IN NEBU
2.5000 mg | INHALATION_SOLUTION | RESPIRATORY_TRACT | Status: AC
  Administered 2012-05-14: 2.5 mg via RESPIRATORY_TRACT
  Filled 2012-05-14: qty 40

## 2012-05-14 MED ORDER — ALBUTEROL SULFATE HFA 108 (90 BASE) MCG/ACT IN AERS: 2.0000 | INHALATION_SPRAY | RESPIRATORY_TRACT | Status: DC | PRN

## 2012-05-14 MED ORDER — PREDNISONE 50 MG PO TABS: 50.0000 mg | ORAL_TABLET | Freq: Every day | ORAL | Status: DC

## 2012-05-14 MED ORDER — IPRATROPIUM BROMIDE 0.02 % IN SOLN
0.5000 mg | Freq: Once | RESPIRATORY_TRACT | Status: AC
  Administered 2012-05-14: 0.5 mg via RESPIRATORY_TRACT
  Filled 2012-05-14: qty 2.5

## 2012-05-14 MED ORDER — FUROSEMIDE 40 MG PO TABS: 40.0000 mg | ORAL_TABLET | Freq: Every day | ORAL | Status: DC

## 2012-05-14 MED ORDER — ASPIRIN 81 MG PO CHEW
324.0000 mg | CHEWABLE_TABLET | Freq: Once | ORAL | Status: AC
  Administered 2012-05-14: 324 mg via ORAL
  Filled 2012-05-14: qty 4
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
## 2012-05-14 NOTE — ED Notes (Signed)
Pt complains of sob, onset last night, worse today, pt in moderate distress. Has not attempted any medications for relief at home or prior to arrival.

## 2012-05-14 NOTE — ED Notes (Signed)
Attempted IV x2 without success  

## 2012-05-17 ENCOUNTER — Other Ambulatory Visit: Payer: Self-pay | Admitting: Ophthalmology
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
## 2012-05-19 ENCOUNTER — Encounter (HOSPITAL_COMMUNITY): Payer: Self-pay | Admitting: Pharmacy Technician

## 2012-05-27 ENCOUNTER — Encounter (HOSPITAL_COMMUNITY)
Admission: RE | Admit: 2012-05-27 | Discharge: 2012-05-27 | Disposition: A | Discharge status: Home / Self Care | Payer: Medicaid Other | Source: Ambulatory Visit | Attending: Ophthalmology | Admitting: Ophthalmology

## 2012-05-27 ENCOUNTER — Encounter (HOSPITAL_COMMUNITY): Payer: Self-pay

## 2012-05-27 LAB — BASIC METABOLIC PANEL
Chloride: 109 mEq/L (ref 96–112)
Creatinine, Ser: 1.67 mg/dL — ABNORMAL HIGH (ref 0.50–1.10)
GFR calc Af Amer: 38 mL/min — ABNORMAL LOW (ref >90)
Sodium: 141 mEq/L (ref 135–145)

## 2012-05-27 LAB — CBC
MCV: 94.2 fL (ref 78.0–100.0)
Platelets: 137 10*3/uL — ABNORMAL LOW (ref 150–400)
RBC: 4.13 MIL/uL (ref 3.87–5.11)
RDW: 13 % (ref 11.5–15.5)
WBC: 7.3 10*3/uL (ref 4.0–10.5)

## 2012-05-27 NOTE — Progress Notes (Addendum)
Pt. Reports that she stays at an apartment, but says "[name]" a nurse stays there too.  Pt's  Comprehension, understanding level, in question ?

## 2012-05-27 NOTE — Pre-Procedure Instructions (Signed)
[address] YOSHI AKITA  05/27/2012   Your procedure is scheduled on:  9/4/2013[facility]  Report to [facility] at 11:20 AM.  Call this number if you have problems the morning of surgery: [phone]   Remember:   Do not eat food or drink liquids :After Midnight.   Take these medicines the morning of surgery with A SIP OF WATER: CARVEDILOL, NEURONTIN, ISOSORBIDE, pain medicine is OK if needed    Do not wear jewelry, make-up or nail polish.  Do not wear lotions, powders, or perfumes. You may wear deodorant.  Do not shave 48 hours prior to surgery. Men may shave face and neck.  Do not bring valuables to the hospital.  Contacts, dentures or bridgework may not be worn into surgery.  Leave suitcase in the car. After surgery it may be brought to your room.  For patients admitted to the hospital, checkout time is 11:00 AM the day of discharge.   Patients discharged the day of surgery will not be allowed to drive home.  Name and phone number of your driver: with family  Special Instructions: CHG Shower Use Special Wash: 1/2 bottle night before surgery and 1/2 bottle morning of surgery.   Please read over the following fact sheets that you were given: Pain Booklet, Coughing and Deep Breathing, MRSA Information and Surgical Site Infection Prevention

## 2012-05-27 NOTE — Progress Notes (Addendum)
Sees Dr. Caryl Comes for pacemaker following, last appt. About one yr. ago, Pt . states that she has been stable, has next appt. In 3 months.  According to [facility], seen 02/2012

## 2012-05-28 ENCOUNTER — Encounter: Payer: Self-pay | Admitting: Internal Medicine

## 2012-05-28 ENCOUNTER — Ambulatory Visit (INDEPENDENT_AMBULATORY_CARE_PROVIDER_SITE_OTHER): Payer: Medicaid Other | Admitting: Internal Medicine

## 2012-05-28 VITALS — BP 157/87 | HR 79 | Temp 97.0°F | Ht 63.0 in | Wt 151.1 lb

## 2012-05-28 DIAGNOSIS — I10 Essential (primary) hypertension: Secondary | ICD-10-CM

## 2012-05-28 DIAGNOSIS — E1122 Type 2 diabetes mellitus with diabetic chronic kidney disease: Secondary | ICD-10-CM

## 2012-05-28 DIAGNOSIS — I129 Hypertensive chronic kidney disease with stage 1 through stage 4 chronic kidney disease, or unspecified chronic kidney disease: Secondary | ICD-10-CM

## 2012-05-28 DIAGNOSIS — E1129 Type 2 diabetes mellitus with other diabetic kidney complication: Secondary | ICD-10-CM

## 2012-05-28 DIAGNOSIS — Z79899 Other long term (current) drug therapy: Secondary | ICD-10-CM

## 2012-05-28 DIAGNOSIS — N189 Chronic kidney disease, unspecified: Secondary | ICD-10-CM

## 2012-05-28 DIAGNOSIS — F17200 Nicotine dependence, unspecified, uncomplicated: Secondary | ICD-10-CM

## 2012-05-28 LAB — POCT GLYCOSYLATED HEMOGLOBIN (HGB A1C): Hemoglobin A1C: 5.7

## 2012-05-28 LAB — GLUCOSE, CAPILLARY: Glucose-Capillary: 134 mg/dL — ABNORMAL HIGH (ref 70–99)

## 2012-05-28 MED ORDER — LISINOPRIL 10 MG PO TABS: 10.0000 mg | ORAL_TABLET | Freq: Every day | ORAL | Status: DC

## 2012-05-28 MED ORDER — ISOSORBIDE MONONITRATE ER 30 MG PO TB24: 30.0000 mg | ORAL_TABLET | Freq: Every day | ORAL | Status: DC
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
## 2012-05-28 NOTE — Assessment & Plan Note (Signed)
Lab Results  Component Value Date   NA 141 05/27/2012   K 3.9 05/27/2012   CL 109 05/27/2012   CO2 24 05/27/2012   BUN 15 05/27/2012   CREATININE 1.67* 05/27/2012   CREATININE 1.88* 12/05/2011    BP Readings from Last 3 Encounters:  05/28/12 157/87  05/27/12 109/72  05/14/12 198/81    Assessment: Hypertension control:  moderately elevated  Progress toward goals:  deteriorated Barriers to meeting goals:  nonadherence to medications  Plan: Hypertension treatment: The only medication patient has with her today is Coreg. She has been out of lisinopril and Imdur for the last week. I will refill these medications. She understands that she is supposed to be on 3 blood pressure medications including lisinopril, Imdur and Coreg. We'll follow blood pressure at next visit.

## 2012-05-28 NOTE — Assessment & Plan Note (Signed)
Lab Results  Component Value Date   HGBA1C 5.7 05/28/2012   HGBA1C 5.9 07/24/2010   POCGLU 186 11/06/2010   CREATININE 1.67* 05/27/2012   CREATININE 1.88* 12/05/2011   MICROALBUR 109.06* 02/09/2012   MICRALBCREAT 834.4* 02/09/2012   CHOL 165 08/28/2011   HDL 37* 08/28/2011   TRIG 232* 08/28/2011    She is followed by ophthalmology, and scheduled for cataract removal at the beginning of September.  Assessment: Diabetes control: controlled Progress toward goals: at goal Barriers to meeting goals: no barriers identified  Plan: Diabetes treatment: Patient tells me that she continues to take insulin when she needs it, but it is not frequent. She does not her glucometer with her today. I asked her bring her glucometer with her next time. For now we will leave her off of insulin. Refer to: none Instruction/counseling given: reminded to bring blood glucose meter & log to each visit and reminded to bring medications to each visit

## 2012-05-28 NOTE — Assessment & Plan Note (Signed)
Continues to smoke. Counseled on the importance of smoking cessation, but does not seem interested in quitting.

## 2012-05-28 NOTE — Consult Note (Signed)
Anesthesia chart review: Patient is a 61 year old female scheduled for Phaco intraocular implants, left eye by Dr. Anderson Malta on 06/02/12.  History includes sinus node dysfunction/complete AVB s/p St. Jude PPM 11/24/09, cardiac arrest '05 (non-obstructive CAD by cath '05), HTN, remote ETOH abuse, PVD s/p left FPBG '03, RUL PNA with sepsis '11, CKD, smoking, hypercholesterolemia, anemia, chronic DOE, headaches, GI bleed 08/2011, OA,   She was just evaluated at [facility] today for routine follow-up of chronic medical conditions.  Dr. Burnard Bunting was aware of planned procedure.  Her Cardiologist is Dr. Caryl Comes.  She was last seen on 03/05/12.  She was noted to be at 97% ventricular pacing and 95% atrial pacing, and he shortened the AV delay.     EKG on 05/14/12 showed baseline artifact, making P waves difficult to identify in all leads and PR interval difficult to measure.  It appears she is in SR with first degree AVB, left BBB.  She has a known left BBB.  Echo on 02/22/10 showed: - Left ventricle: The cavity size was mildly dilated. There was moderate concentric hypertrophy. The estimated ejection fraction was 55%. - Mitral valve: Mild regurgitation. - Atrial septum: No defect or patent foramen ovale was identified. - Pulmonary arteries: PA peak pressure: 44mm Hg (S). - Tricuspid valve: Trivial regurgitation.  Cardiac cath on 06/17/2004 showed preserved overall left ventricular systolic function, EF [id number], no critical coronary obstructive disease (DIAG1 50-70%, CX Marginal branch 40%).  Chest x-ray report on 05/27/2012 showed no active cardiopulmonary disease, stable duel lead left subclavian cardiac pacemaker.  Labs from 05/27/12 noted.  Cr 1.67 (stable).  Glucose 86.  H/H WNL.  PLT 137.  A1C 5.7 on 05/28/12.  Short Stay nursing staff will follow-up on the perioperative PPM prescription form from [facility].  She will be evaluated by her assigned Anesthesiologist on the day of surgery.   Anticipate she will be able to proceed as planned.  Myra Gianotti, PA-C

## 2012-05-28 NOTE — Patient Instructions (Signed)
-  Please be sure you are taking Lisinopril, Isosorbide Mononitrate & Coreg everyday for your blood pressure.  -You're diabetes is doing well, keep it up!  Please be sure to bring all of your medications with you to every visit.  Should you have any new or worsening symptoms, please be sure to call the clinic at [phone].

## 2012-06-02 ENCOUNTER — Encounter (HOSPITAL_COMMUNITY): Payer: Self-pay | Admitting: *Deleted

## 2012-06-02 ENCOUNTER — Ambulatory Visit (HOSPITAL_COMMUNITY): Payer: Medicaid Other | Admitting: Vascular Surgery

## 2012-06-02 ENCOUNTER — Encounter (HOSPITAL_COMMUNITY): Payer: Self-pay | Admitting: Vascular Surgery

## 2012-06-02 ENCOUNTER — Ambulatory Visit (HOSPITAL_COMMUNITY)
Admission: RE | Admit: 2012-06-02 | Discharge: 2012-06-02 | Disposition: A | Discharge status: Home / Self Care | Payer: Medicaid Other | Source: Ambulatory Visit | Attending: Ophthalmology | Admitting: Ophthalmology

## 2012-06-02 ENCOUNTER — Encounter: Payer: Medicaid Other | Admitting: Internal Medicine

## 2012-06-02 ENCOUNTER — Encounter (HOSPITAL_COMMUNITY)
Admission: RE | Disposition: A | Discharge status: Home / Self Care | Payer: Self-pay | Source: Ambulatory Visit | Attending: Ophthalmology

## 2012-06-02 DIAGNOSIS — Z8701 Personal history of pneumonia (recurrent): Secondary | ICD-10-CM | POA: Insufficient documentation

## 2012-06-02 DIAGNOSIS — N189 Chronic kidney disease, unspecified: Secondary | ICD-10-CM | POA: Insufficient documentation

## 2012-06-02 DIAGNOSIS — F17200 Nicotine dependence, unspecified, uncomplicated: Secondary | ICD-10-CM | POA: Insufficient documentation

## 2012-06-02 DIAGNOSIS — Z87442 Personal history of urinary calculi: Secondary | ICD-10-CM | POA: Insufficient documentation

## 2012-06-02 DIAGNOSIS — E119 Type 2 diabetes mellitus without complications: Secondary | ICD-10-CM | POA: Insufficient documentation

## 2012-06-02 DIAGNOSIS — M1990 Unspecified osteoarthritis, unspecified site: Secondary | ICD-10-CM | POA: Insufficient documentation

## 2012-06-02 DIAGNOSIS — Z90710 Acquired absence of both cervix and uterus: Secondary | ICD-10-CM | POA: Insufficient documentation

## 2012-06-02 DIAGNOSIS — H269 Unspecified cataract: Secondary | ICD-10-CM | POA: Insufficient documentation

## 2012-06-02 DIAGNOSIS — I129 Hypertensive chronic kidney disease with stage 1 through stage 4 chronic kidney disease, or unspecified chronic kidney disease: Secondary | ICD-10-CM | POA: Insufficient documentation

## 2012-06-02 DIAGNOSIS — E7800 Pure hypercholesterolemia, unspecified: Secondary | ICD-10-CM | POA: Insufficient documentation

## 2012-06-02 DIAGNOSIS — I10 Essential (primary) hypertension: Secondary | ICD-10-CM | POA: Insufficient documentation

## 2012-06-02 DIAGNOSIS — I2510 Atherosclerotic heart disease of native coronary artery without angina pectoris: Secondary | ICD-10-CM | POA: Insufficient documentation

## 2012-06-02 HISTORY — PX: CATARACT EXTRACTION W/PHACO: SHX586

## 2012-06-02 LAB — GLUCOSE, CAPILLARY

## 2012-06-02 SURGERY — PHACOEMULSIFICATION, CATARACT, WITH IOL INSERTION
Anesthesia: Monitor Anesthesia Care | Site: Eye | Laterality: Left | Wound class: Clean

## 2012-06-02 MED ORDER — GATIFLOXACIN 0.5 % OP SOLN
1.0000 [drp] | OPHTHALMIC | Status: AC | PRN
  Administered 2012-06-02 (×3): 1 [drp] via OPHTHALMIC

## 2012-06-02 MED ORDER — HYDRALAZINE HCL 20 MG/ML IJ SOLN
INTRAMUSCULAR | Status: DC | PRN
  Administered 2012-06-02 (×2): 5 mg via INTRAVENOUS

## 2012-06-02 MED ORDER — HYPROMELLOSE (GONIOSCOPIC) 2.5 % OP SOLN
OPHTHALMIC | Status: DC | PRN
  Administered 2012-06-02: 1 [drp] via OPHTHALMIC

## 2012-06-02 MED ORDER — TETRACAINE HCL 0.5 % OP SOLN
OPHTHALMIC | Status: AC
  Administered 2012-06-02: 2 [drp] via OPHTHALMIC
  Filled 2012-06-02: qty 2

## 2012-06-02 MED ORDER — PREDNISOLONE ACETATE 1 % OP SUSP
1.0000 [drp] | OPHTHALMIC | Status: AC
  Administered 2012-06-02: 1 [drp] via OPHTHALMIC

## 2012-06-02 MED ORDER — FENTANYL CITRATE 0.05 MG/ML IJ SOLN: 50.0000 ug | INTRAMUSCULAR | Status: DC | PRN

## 2012-06-02 MED ORDER — NA CHONDROIT SULF-NA HYALURON 40-30 MG/ML IO SOLN
INTRAOCULAR | Status: AC
  Filled 2012-06-02: qty 1

## 2012-06-02 MED ORDER — PREDNISOLONE ACETATE 1 % OP SUSP
OPHTHALMIC | Status: AC
  Administered 2012-06-02: 1 [drp] via OPHTHALMIC
  Filled 2012-06-02: qty 5

## 2012-06-02 MED ORDER — BALANCED SALT IO SOLN
INTRAOCULAR | Status: DC | PRN
  Administered 2012-06-02: 15 mL via INTRAOCULAR

## 2012-06-02 MED ORDER — PHENYLEPHRINE HCL 2.5 % OP SOLN
OPHTHALMIC | Status: AC
  Administered 2012-06-02: 1 [drp] via OPHTHALMIC
  Filled 2012-06-02: qty 3

## 2012-06-02 MED ORDER — EPINEPHRINE HCL 1 MG/ML IJ SOLN
INTRAOCULAR | Status: DC | PRN
  Administered 2012-06-02: 14:00:00

## 2012-06-02 MED ORDER — SODIUM CHLORIDE 0.9 % IV SOLN
INTRAVENOUS | Status: DC | PRN
  Administered 2012-06-02: 13:00:00 via INTRAVENOUS

## 2012-06-02 MED ORDER — CEFAZOLIN SUBCONJUNCTIVAL INJECTION 100 MG/0.5 ML
200.0000 mg | INJECTION | SUBCONJUNCTIVAL | Status: DC
  Filled 2012-06-02: qty 1

## 2012-06-02 MED ORDER — NA CHONDROIT SULF-NA HYALURON 40-30 MG/ML IO SOLN
INTRAOCULAR | Status: DC | PRN
  Administered 2012-06-02: 0.5 mL via INTRAOCULAR

## 2012-06-02 MED ORDER — SODIUM HYALURONATE 10 MG/ML IO SOLN
INTRAOCULAR | Status: AC
  Filled 2012-06-02: qty 1.7

## 2012-06-02 MED ORDER — TETRACAINE HCL 0.5 % OP SOLN
OPHTHALMIC | Status: AC
  Filled 2012-06-02: qty 2

## 2012-06-02 MED ORDER — CEFAZOLIN SUBCONJUNCTIVAL INJECTION 100 MG/0.5 ML
INJECTION | SUBCONJUNCTIVAL | Status: DC | PRN
  Administered 2012-06-02: 200 mg via SUBCONJUNCTIVAL

## 2012-06-02 MED ORDER — LIDOCAINE HCL (PF) 2 % IJ SOLN
INTRAMUSCULAR | Status: DC | PRN
  Administered 2012-06-02: 10 mL

## 2012-06-02 MED ORDER — BACITRACIN-POLYMYXIN B 500-10000 UNIT/GM OP OINT
TOPICAL_OINTMENT | OPHTHALMIC | Status: DC | PRN
  Administered 2012-06-02: 1 via OPHTHALMIC

## 2012-06-02 MED ORDER — BSS IO SOLN
INTRAOCULAR | Status: AC
  Filled 2012-06-02: qty 500

## 2012-06-02 MED ORDER — BUPIVACAINE HCL 0.75 % IJ SOLN
INTRAMUSCULAR | Status: DC | PRN
  Administered 2012-06-02: 10 mL

## 2012-06-02 MED ORDER — EPINEPHRINE HCL 1 MG/ML IJ SOLN
INTRAMUSCULAR | Status: AC
  Filled 2012-06-02: qty 1

## 2012-06-02 MED ORDER — MIDAZOLAM HCL 2 MG/2ML IJ SOLN: 1.0000 mg | INTRAMUSCULAR | Status: DC | PRN

## 2012-06-02 MED ORDER — SODIUM CHLORIDE 0.9 % IV SOLN
INTRAVENOUS | Status: DC
  Administered 2012-06-02: 13:00:00 via INTRAVENOUS

## 2012-06-02 MED ORDER — PROVISC 10 MG/ML IO SOLN
INTRAOCULAR | Status: DC | PRN
  Administered 2012-06-02: .85 mL via INTRAOCULAR

## 2012-06-02 MED ORDER — HYPROMELLOSE (GONIOSCOPIC) 2.5 % OP SOLN
OPHTHALMIC | Status: AC
  Filled 2012-06-02: qty 15

## 2012-06-02 MED ORDER — PROPOFOL 10 MG/ML IV EMUL
INTRAVENOUS | Status: DC | PRN
  Administered 2012-06-02: 60 mg via INTRAVENOUS

## 2012-06-02 MED ORDER — TETRACAINE HCL 0.5 % OP SOLN
2.0000 [drp] | OPHTHALMIC | Status: AC
  Administered 2012-06-02: 2 [drp] via OPHTHALMIC

## 2012-06-02 MED ORDER — BACITRACIN-POLYMYXIN B 500-10000 UNIT/GM OP OINT
TOPICAL_OINTMENT | OPHTHALMIC | Status: AC
  Filled 2012-06-02: qty 3.5

## 2012-06-02 MED ORDER — LIDOCAINE HCL 2 % IJ SOLN
INTRAMUSCULAR | Status: AC
  Filled 2012-06-02: qty 1

## 2012-06-02 MED ORDER — PHENYLEPHRINE HCL 2.5 % OP SOLN
1.0000 [drp] | OPHTHALMIC | Status: AC | PRN
  Administered 2012-06-02 (×3): 1 [drp] via OPHTHALMIC

## 2012-06-02 MED ORDER — BUPIVACAINE HCL 0.75 % IJ SOLN
INTRAMUSCULAR | Status: AC
  Filled 2012-06-02: qty 10

## 2012-06-02 MED ORDER — DEXAMETHASONE SODIUM PHOSPHATE 10 MG/ML IJ SOLN
INTRAMUSCULAR | Status: DC | PRN
  Administered 2012-06-02: 10 mg

## 2012-06-02 MED ORDER — TRIAMCINOLONE ACETONIDE 40 MG/ML IJ SUSP
INTRAMUSCULAR | Status: AC
  Filled 2012-06-02: qty 1

## 2012-06-02 MED ORDER — DEXAMETHASONE SODIUM PHOSPHATE 10 MG/ML IJ SOLN
INTRAMUSCULAR | Status: AC
  Filled 2012-06-02: qty 1

## 2012-06-02 MED ORDER — GATIFLOXACIN 0.5 % OP SOLN
OPHTHALMIC | Status: AC
  Administered 2012-06-02: 1 [drp] via OPHTHALMIC
  Filled 2012-06-02: qty 2.5

## 2012-06-02 SURGICAL SUPPLY — 58 items
APL SRG 3 HI ABS STRL LF PLS (MISCELLANEOUS) ×1
APPLICATOR COTTON TIP 6IN STRL (MISCELLANEOUS) ×2 IMPLANT
APPLICATOR DR MATTHEWS STRL (MISCELLANEOUS) ×2 IMPLANT
BAG FLD CLT MN 6.25X3.5 (WOUND CARE) ×1
BAG MINI COLL DRAIN (WOUND CARE) ×2 IMPLANT
BLADE EYE MINI 60D BEAVER (BLADE) IMPLANT
BLADE KERATOME 2.75 (BLADE) ×2 IMPLANT
BLADE STAB KNIFE 15DEG (BLADE) IMPLANT
CANNULA ANTERIOR CHAMBER 27GA (MISCELLANEOUS) ×2 IMPLANT
CLOTH BEACON ORANGE TIMEOUT ST (SAFETY) ×2 IMPLANT
DRAPE OPHTHALMIC 77X100 STRL (CUSTOM PROCEDURE TRAY) ×2 IMPLANT
DRAPE POUCH INSTRU U-SHP 10X18 (DRAPES) ×2 IMPLANT
DRSG TEGADERM 4X4.75 (GAUZE/BANDAGES/DRESSINGS) ×2 IMPLANT
FILTER BLUE MILLIPORE (MISCELLANEOUS) IMPLANT
GLOVE SS BIOGEL STRL SZ 6.5 (GLOVE) ×1 IMPLANT
GLOVE SUPERSENSE BIOGEL SZ 6.5 (GLOVE) ×1
GOWN SRG XL XLNG 56XLVL 4 (GOWN DISPOSABLE) ×1 IMPLANT
GOWN STRL NON-REIN LRG LVL3 (GOWN DISPOSABLE) ×2 IMPLANT
GOWN STRL NON-REIN XL XLG LVL4 (GOWN DISPOSABLE) ×2
KIT BASIN OR (CUSTOM PROCEDURE TRAY) ×2 IMPLANT
KIT ROOM TURNOVER OR (KITS) ×2 IMPLANT
KNIFE GRIESHABER SHARP 2.5MM (MISCELLANEOUS) ×2 IMPLANT
LENS IOL ACRYSOF MP POST 19.0 (Intraocular Lens) ×2 IMPLANT
MASK EYE SHIELD (GAUZE/BANDAGES/DRESSINGS) ×2 IMPLANT
NEEDLE 18GX1X1/2 (RX/OR ONLY) (NEEDLE) ×2 IMPLANT
NEEDLE 22X1 1/2 (OR ONLY) (NEEDLE) ×2 IMPLANT
NEEDLE 25GX 5/8IN NON SAFETY (NEEDLE) ×2 IMPLANT
NEEDLE FILTER BLUNT 18X 1/2SAF (NEEDLE)
NEEDLE FILTER BLUNT 18X1 1/2 (NEEDLE) IMPLANT
NEEDLE HYPO 30X.5 LL (NEEDLE) ×4 IMPLANT
NS IRRIG 1000ML POUR BTL (IV SOLUTION) ×2 IMPLANT
PACK CATARACT CUSTOM (CUSTOM PROCEDURE TRAY) ×2 IMPLANT
PACK CATARACT MCHSCP (PACKS) ×2 IMPLANT
PACK COMBINED CATERACT/VIT 23G (OPHTHALMIC RELATED) IMPLANT
PAD ARMBOARD 7.5X6 YLW CONV (MISCELLANEOUS) ×4 IMPLANT
PAD EYE OVAL STERILE LF (GAUZE/BANDAGES/DRESSINGS) ×2 IMPLANT
PHACO TIP KELMAN 45DEG (TIP) ×2 IMPLANT
PROBE ANTERIOR 20G W/INFUS NDL (MISCELLANEOUS) IMPLANT
ROLLS DENTAL (MISCELLANEOUS) IMPLANT
SHUTTLE MONARCH TYPE A (NEEDLE) ×2 IMPLANT
SOLUTION ANTI FOG 6CC (MISCELLANEOUS) ×2 IMPLANT
SPEAR EYE SURG WECK-CEL (MISCELLANEOUS) ×2 IMPLANT
SUT ETHILON 10-0 CS-B-6CS-B-6 (SUTURE)
SUT ETHILON 5 0 P 3 18 (SUTURE)
SUT ETHILON 9 0 TG140 8 (SUTURE) IMPLANT
SUT NYLON ETHILON 5-0 P-3 1X18 (SUTURE) IMPLANT
SUT PLAIN 6 0 TG1408 (SUTURE) IMPLANT
SUT POLY NON ABSORB 10-0 8 STR (SUTURE) IMPLANT
SUT VICRYL 6 0 S 29 12 (SUTURE) IMPLANT
SUTURE EHLN 10-0 CS-B-6CS-B-6 (SUTURE) IMPLANT
SYR 20CC LL (SYRINGE) ×2 IMPLANT
SYR 5ML LL (SYRINGE) IMPLANT
SYR TB 1ML LUER SLIP (SYRINGE) ×2 IMPLANT
SYRINGE 10CC LL (SYRINGE) IMPLANT
TAPE PAPER MEDFIX 1IN X 10YD (GAUZE/BANDAGES/DRESSINGS) ×2 IMPLANT
TOWEL OR 17X24 6PK STRL BLUE (TOWEL DISPOSABLE) ×4 IMPLANT
WATER STERILE IRR 1000ML POUR (IV SOLUTION) ×2 IMPLANT
WIPE INSTRUMENT VISIWIPE 73X73 (MISCELLANEOUS) ×2 IMPLANT

## 2012-06-02 NOTE — Interval H&P Note (Signed)
History and Physical Interval Note:  06/02/2012 11:30 AM  [name] [name]  has presented today for surgery, with the diagnosis of Combined Cataract Left Eye  The various methods of treatment have been discussed with the patient and family. After consideration of risks, benefits and other options for treatment, the patient has consented to  Procedure(s) (LRB) with comments: CATARACT EXTRACTION PHACO AND INTRAOCULAR LENS PLACEMENT ([place]) (Left) as a surgical intervention .  The patient's history has been reviewed, patient examined, no change in status, stable for surgery.  I have reviewed the patient's chart and labs.  Questions were answered to the patient's satisfaction.     Adonis Brook, MD

## 2012-06-02 NOTE — Anesthesia Preprocedure Evaluation (Signed)
Anesthesia Evaluation  Patient identified by MRN, date of birth, ID band Patient awake    Reviewed: Allergy & Precautions, H&P , NPO status , Patient's Chart, lab work & pertinent test results  Airway Mallampati: I TM Distance: >3 FB Neck ROM: Full    Dental   Pulmonary shortness of breath, COPDformer smoker,  + rhonchi         Cardiovascular hypertension, + CAD + dysrhythmias + pacemaker Rhythm:Regular Rate:Normal     Neuro/Psych  Headaches,  Neuromuscular disease    GI/Hepatic   Endo/Other  diabetes  Renal/GU      Musculoskeletal   Abdominal   Peds  Hematology   Anesthesia Other Findings   Reproductive/Obstetrics                           Anesthesia Physical Anesthesia Plan  ASA: III  Anesthesia Plan: MAC   Post-op Pain Management:    Induction: Intravenous  Airway Management Planned: Simple Face Mask  Additional Equipment:   Intra-op Plan:   Post-operative Plan:   Informed Consent: I have reviewed the patients History and Physical, chart, labs and discussed the procedure including the risks, benefits and alternatives for the proposed anesthesia with the patient or authorized representative who has indicated his/her understanding and acceptance.     Plan Discussed with: CRNA and Surgeon  Anesthesia Plan Comments:         Anesthesia Quick Evaluation
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
## 2012-06-02 NOTE — Progress Notes (Signed)
cbg upon arrival to short stay is 71.. Dr Chriss Driver called "pt is not symptomatic and does not take insulin"  Also she reports that she is usually 80-90 in the am  And she does not check sugar  Except this one  Time a day (in the morning) ... No orders received ... Do not treat pt .  Per Dr Chriss Driver .

## 2012-06-02 NOTE — Anesthesia Postprocedure Evaluation (Signed)
  Anesthesia Post-op Note  Patient: [name] [name]  Procedure(s) Performed: Procedure(s) (LRB) with comments: CATARACT EXTRACTION PHACO AND INTRAOCULAR LENS PLACEMENT ([facility]) (Left)  Patient Location: Short Stay  Anesthesia Type: MAC  Level of Consciousness: awake, alert , oriented and patient cooperative  Airway and Oxygen Therapy: Patient Spontanous Breathing  Post-op Pain: none  Post-op Assessment: Post-op Vital signs reviewed, Patient's Cardiovascular Status Stable, Respiratory Function Stable, Patent Airway and No signs of Nausea or vomiting  Post-op Vital Signs: Reviewed and stable  Complications: No apparent anesthesia complications

## 2012-06-02 NOTE — Anesthesia Procedure Notes (Signed)
Procedure Name: MAC Date/Time: 06/02/2012 1:38 PM Performed by: Jon Gills A Pre-anesthesia Checklist: Patient identified, Emergency Drugs available, Suction available and Patient being monitored Oxygen Delivery Method: Nasal cannula Placement Confirmation: positive ETCO2

## 2012-06-02 NOTE — Preoperative (Signed)
Beta Blockers   Reason not to administer Beta Blockers:Not Applicable 

## 2012-06-02 NOTE — Op Note (Signed)
[name] [name] 06/02/2012 Combined Cataract  Procedure: Phacoemulsification, Posterior Chamber Intra-ocular Lens Operative Eye:  left eye  Surgeon: Adonis Brook Estimated Blood Loss: minimal Specimens for Pathology:  None Complications: none  The patient was prepared and draped in the usual manner for ocular surgery on the left eye. A Cook lid speculum was placed. A peripheral clear corneal incision was made at the surgical limbus centered at the 11:00 meridian. A separate clear corneal stab incision was made with a 15 degree blade at the 2:00 meridian to permit bi-manual technique. Provisc was instilled into the anterior chamber through that incision.  A keratome was used to create a self sealing incision entering the anterior chamber at the 11:00 meridian. A capsulorhexis was performed using a bent 25g needle. The lens was hydrodissected and the nucleus was hydrodilineated using a Nichammin cannula. The Chang chopper was inserted and used to rotate the lens to insure adequate lens mobility. The phacoemulsification handpiece was inserted and a combined phaco-chop technique was employed, fracturing the lens into separate sections with subsequent removal with the phaco handpiece.   The I/A cannula was used to remove remaining lens cortex. Provisc was instilled and used to deepen the anterior chamber and posterior capsule bag. The Monarch injector was used to place a folded Acrysof [id number] PC IOL, + 19.00  diopters, into the capsule bag. A Sinskey lens hook was used to dial in the trailing haptic.  The I/A cannula was used to remove the viscoelastic from the anterior chamber. BSS was used to bring IOP to the desired range and the wound was checked to insure it was watertight. Subconjunctival injections of Ancef 100/0.23ml and Dexamethasone 4mg /62ml were placed without complication. The lid speculum and drapes were removed and the patient's eye was patched with Polymixin/Bacitracin ophthalmic ointment. An  eye shield was placed and the patient was transferred alert and conversant from the operating room to the post-operative recovery area.   Adonis Brook, MD

## 2012-06-02 NOTE — Transfer of Care (Signed)
Immediate Anesthesia Transfer of Care Note  Patient: [name] [name]  Procedure(s) Performed: Procedure(s) (LRB) with comments: CATARACT EXTRACTION PHACO AND INTRAOCULAR LENS PLACEMENT (IOC) (Left)  Patient Location: Short Stay  Anesthesia Type: MAC  Level of Consciousness: awake, alert , oriented and patient cooperative  Airway & Oxygen Therapy: Patient Spontanous Breathing  Post-op Assessment: Post -op Vital signs reviewed and stable  Post vital signs: Reviewed and stable  Complications: No apparent anesthesia complications

## 2012-06-03 ENCOUNTER — Encounter (HOSPITAL_COMMUNITY): Payer: Self-pay | Admitting: Ophthalmology

## 2012-07-06 ENCOUNTER — Other Ambulatory Visit: Payer: Self-pay | Admitting: *Deleted

## 2012-07-06 DIAGNOSIS — J302 Other seasonal allergic rhinitis: Secondary | ICD-10-CM

## 2012-07-06 MED ORDER — LORATADINE 10 MG PO TABS: 10.0000 mg | ORAL_TABLET | Freq: Every day | ORAL | Status: DC

## 2012-07-13 ENCOUNTER — Other Ambulatory Visit: Payer: Self-pay | Admitting: *Deleted

## 2012-07-13 NOTE — Telephone Encounter (Signed)
She called and ask for this medication

## 2012-07-13 NOTE — Telephone Encounter (Signed)
Pt is requesting the liquid from of lomotil. Has been on it in the past.

## 2012-07-14 ENCOUNTER — Other Ambulatory Visit: Payer: Self-pay | Admitting: Internal Medicine

## 2012-07-14 DIAGNOSIS — R197 Diarrhea, unspecified: Secondary | ICD-10-CM

## 2012-07-14 MED ORDER — DIPHENOXYLATE-ATROPINE 2.5-0.025 MG/5ML PO LIQD: 10.0000 mL | Freq: Four times a day (QID) | ORAL | Status: DC | PRN

## 2012-07-14 NOTE — Telephone Encounter (Signed)
Refill sent. Thanks

## 2012-08-25 ENCOUNTER — Ambulatory Visit (INDEPENDENT_AMBULATORY_CARE_PROVIDER_SITE_OTHER): Payer: Medicaid Other | Admitting: Internal Medicine

## 2012-08-25 ENCOUNTER — Encounter: Payer: Self-pay | Admitting: Internal Medicine

## 2012-08-25 VITALS — BP 183/78 | HR 60 | Temp 98.0°F | Wt 154.4 lb

## 2012-08-25 DIAGNOSIS — E1129 Type 2 diabetes mellitus with other diabetic kidney complication: Secondary | ICD-10-CM

## 2012-08-25 DIAGNOSIS — F17200 Nicotine dependence, unspecified, uncomplicated: Secondary | ICD-10-CM

## 2012-08-25 DIAGNOSIS — K529 Noninfective gastroenteritis and colitis, unspecified: Secondary | ICD-10-CM

## 2012-08-25 DIAGNOSIS — N189 Chronic kidney disease, unspecified: Secondary | ICD-10-CM

## 2012-08-25 DIAGNOSIS — J309 Allergic rhinitis, unspecified: Secondary | ICD-10-CM

## 2012-08-25 DIAGNOSIS — I10 Essential (primary) hypertension: Secondary | ICD-10-CM

## 2012-08-25 DIAGNOSIS — Z79899 Other long term (current) drug therapy: Secondary | ICD-10-CM

## 2012-08-25 DIAGNOSIS — J302 Other seasonal allergic rhinitis: Secondary | ICD-10-CM

## 2012-08-25 DIAGNOSIS — R197 Diarrhea, unspecified: Secondary | ICD-10-CM

## 2012-08-25 DIAGNOSIS — E559 Vitamin D deficiency, unspecified: Secondary | ICD-10-CM

## 2012-08-25 DIAGNOSIS — E1122 Type 2 diabetes mellitus with diabetic chronic kidney disease: Secondary | ICD-10-CM

## 2012-08-25 DIAGNOSIS — I129 Hypertensive chronic kidney disease with stage 1 through stage 4 chronic kidney disease, or unspecified chronic kidney disease: Secondary | ICD-10-CM

## 2012-08-25 DIAGNOSIS — E785 Hyperlipidemia, unspecified: Secondary | ICD-10-CM

## 2012-08-25 DIAGNOSIS — D696 Thrombocytopenia, unspecified: Secondary | ICD-10-CM

## 2012-08-25 LAB — CBC
HCT: 39.7 % (ref 36.0–46.0)
Hemoglobin: 13 g/dL (ref 12.0–15.0)
MCH: 30.9 pg (ref 26.0–34.0)
MCHC: 32.7 g/dL (ref 30.0–36.0)
MCV: 94.3 fL (ref 78.0–100.0)

## 2012-08-25 MED ORDER — DIPHENOXYLATE-ATROPINE 2.5-0.025 MG/5ML PO LIQD: 10.0000 mL | Freq: Four times a day (QID) | ORAL | Status: DC | PRN

## 2012-08-25 MED ORDER — ISOSORBIDE MONONITRATE ER 30 MG PO TB24: 60.0000 mg | ORAL_TABLET | Freq: Every day | ORAL | Status: DC

## 2012-08-25 NOTE — Assessment & Plan Note (Signed)
Counseled on importance of smoking cessation. Spent approx 5 min discussing.

## 2012-08-25 NOTE — Assessment & Plan Note (Signed)
Lab Results  Component Value Date   NA 141 05/27/2012   K 3.9 05/27/2012   CL 109 05/27/2012   CO2 24 05/27/2012   BUN 15 05/27/2012   CREATININE 1.67* 05/27/2012   CREATININE 1.88* 12/05/2011    BP Readings from Last 3 Encounters:  08/25/12 183/78  06/02/12 156/73  06/02/12 156/73    Assessment: Hypertension control:  moderately elevated  Progress toward goals:  deteriorated Barriers to meeting goals:  emotional stress  Plan: Hypertension treatment:   Continue lisinopril 10 and coreg 6.25bid, will increase Imdur to 60mg  daily; she will likely require addition of lasix BID and increase in lisinopril, but with holidays approaching, will postpone change for 3 months from now.  Check renal function today. She reports compliance with antihypertensive meds today.

## 2012-08-25 NOTE — Assessment & Plan Note (Signed)
Last LDL = 82 one year prior.  Continues Simvastatin 40 qHS.  Check lipid panel today.

## 2012-08-25 NOTE — Assessment & Plan Note (Signed)
On going, likely related to ischemic colitis. No recent fever/leukocytosis, so not likely related to C.diff, or other infectious etiology.  Recent colonoscopy in 02/2012 negative for malignancy.  Will check b12, Vit D and ferritin to evaluate for malabsorption.  Refilled Lomotil for symptomatic mgmt.

## 2012-08-25 NOTE — Assessment & Plan Note (Signed)
Continues to use loratadine as needed.

## 2012-08-25 NOTE — Patient Instructions (Addendum)
-  Increase Imdur to 60 mg daily (take two 30mg  tablets) - if you have increased dizziness or light-headedness, please decrease back to 30mg  daily and call clinic  -I have refilled your lomotil as well  -Keep up the good work with taking all of your medications  -Let's work on quitting smoking!  Please be sure to bring all of your medications with you to every visit.  Should you have any new or worsening symptoms, please be sure to call the clinic at [phone].

## 2012-08-25 NOTE — Assessment & Plan Note (Signed)
Lab Results  Component Value Date   HGBA1C 5.2 08/25/2012   HGBA1C 5.9 07/24/2010   POCGLU 186 11/06/2010   CREATININE 1.67* 05/27/2012   CREATININE 1.88* 12/05/2011   MICROALBUR 109.06* 02/09/2012   MICRALBCREAT 834.4* 02/09/2012   CHOL 165 08/28/2011   HDL 37* 08/28/2011   TRIG 232* 08/28/2011    Last eye exam and foot exam:    Component Value Date/Time   HMDIABEYEEXA No diabetic retinopathy. Glaucoma well controlled. Cataracts, subcapsular 07/10/2009    Assessment: Diabetes control: controlled Progress toward goals: at goal Barriers to meeting goals: no barriers identified  Plan: Diabetes treatment: Diet control, no longer on insulin (since 01/2012) Refer to: none Instruction/counseling given: reminded to bring blood glucose meter & log to each visit, reminded to bring medications to each visit and discussed foot care
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
## 2012-08-26 LAB — LIPID PANEL
HDL: 74 mg/dL (ref >39)
LDL Cholesterol: 121 mg/dL — ABNORMAL HIGH (ref 0–99)
Total CHOL/HDL Ratio: 3 Ratio
Triglycerides: 140 mg/dL (ref <150)
VLDL: 28 mg/dL (ref 0–40)

## 2012-08-26 LAB — COMPREHENSIVE METABOLIC PANEL
ALT: 14 U/L (ref 0–35)
AST: 21 U/L (ref 0–37)
Alkaline Phosphatase: 96 U/L (ref 39–117)
Creat: 1.64 mg/dL — ABNORMAL HIGH (ref 0.50–1.10)
Total Bilirubin: 1 mg/dL (ref 0.3–1.2)

## 2012-08-26 LAB — VITAMIN B12: Vitamin B-12: 367 pg/mL (ref 211–911)

## 2012-08-26 LAB — VITAMIN D 25 HYDROXY (VIT D DEFICIENCY, FRACTURES): Vit D, 25-Hydroxy: 26 ng/mL — ABNORMAL LOW (ref 30–89)

## 2012-08-28 ENCOUNTER — Telehealth: Payer: Self-pay | Admitting: Internal Medicine

## 2012-08-28 DIAGNOSIS — E785 Hyperlipidemia, unspecified: Secondary | ICD-10-CM

## 2012-08-28 DIAGNOSIS — K529 Noninfective gastroenteritis and colitis, unspecified: Secondary | ICD-10-CM

## 2012-08-28 MED ORDER — ROSUVASTATIN CALCIUM 10 MG PO TABS: 10.0000 mg | ORAL_TABLET | Freq: Every day | ORAL | Status: DC

## 2012-08-28 NOTE — Assessment & Plan Note (Signed)
ADDENDUM: Vit D level persistently low, but B12 & ferritin are not, thus malabsorption is less likely. Patient is asymptomatic regarding Vit D def, and level is borderline low (26, normal >30), so will not change Vit D therapy at this time.

## 2012-08-28 NOTE — Telephone Encounter (Signed)
Called patient on 08/28/12 at 10:30 am to discuss lipid panel results.  She reports compliance with simvastatin 40 daily, and LDL = 121, which is above goal (goal <70).  She has h/o vascular disease as well as DM.  She will require more potent therapy, so we will change mgmt to rosuvastatin 10mg  daily.  She voiced understanding and will discontinue simvastatin and fill new prescription.

## 2012-08-28 NOTE — Assessment & Plan Note (Signed)
Discussed with patient over the phone on 08/28/12.  Will change simvastatin 40 to rosuvastatin 10 given LDL = 121 , and patient has h/o DM & vascular disease, and she reports compliance with simvastatin.  She thus requires a more potent medication.

## 2012-09-02 ENCOUNTER — Encounter: Payer: Self-pay | Admitting: Internal Medicine

## 2012-09-02 ENCOUNTER — Ambulatory Visit (INDEPENDENT_AMBULATORY_CARE_PROVIDER_SITE_OTHER): Payer: Medicaid Other | Admitting: *Deleted

## 2012-09-02 DIAGNOSIS — Z950 Presence of cardiac pacemaker: Secondary | ICD-10-CM

## 2012-09-02 DIAGNOSIS — I495 Sick sinus syndrome: Secondary | ICD-10-CM

## 2012-09-02 LAB — PACEMAKER DEVICE OBSERVATION
AL THRESHOLD: 0.75 V
BATTERY VOLTAGE: 2.9629 V

## 2012-09-02 NOTE — Progress Notes (Signed)
Pacer check in clinic  

## 2012-09-09 ENCOUNTER — Telehealth: Payer: Self-pay | Admitting: *Deleted

## 2012-09-09 NOTE — Telephone Encounter (Signed)
Received PA request from pts pharmacy [facility] [phone].  Pt has tried and failed simvastatin 40 mg and Per MD notes "She reports compliance with simvastatin 40 daily, and LDL = 121, which is above goal (goal <70). She has h/o vascular disease as well as DM. She will require more potent therapy, so we will change mgmt to rosuvastatin 10mg  daily", medication approved for 1 year,  Pharmacy notified.[name] [name], [name]12/12/20139:29 AM  .

## 2012-09-25 HISTORY — PX: TOTAL HIP ARTHROPLASTY: SHX124

## 2012-10-04 ENCOUNTER — Telehealth: Payer: Self-pay | Admitting: *Deleted

## 2012-10-04 ENCOUNTER — Encounter: Payer: Self-pay | Admitting: Neurosurgery
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
## 2012-10-04 NOTE — Telephone Encounter (Signed)
CSW met with Ms. Helene Kelp, pt's sister.  Ms. Helene Kelp provided CSW with phone number to [facility] hospital nursing station and pt's room.  Ms. Helene Kelp does not know the name of the hospital, other than [facility] attempted to call nursing station and pt's room, no answer.  Provided Ms. [employer] of rehab facilities in [place].  Discussed with Ms. Helene Kelp, best option would be for Ms. Viviani to be d/c to SNF in [place] for short term then transferred to SNF in [place] will attempt to obtain social worker/discharge planner in hospital to offer assistance as this CSW is able to provide.

## 2012-10-05 ENCOUNTER — Telehealth: Payer: Self-pay | Admitting: Licensed Clinical Social Worker

## 2012-10-05 ENCOUNTER — Ambulatory Visit: Payer: Medicaid Other | Admitting: Neurosurgery

## 2012-10-05 NOTE — Telephone Encounter (Signed)
CSW placed call to discharge planner at [facility].  Discharge planner states [name] [name] was discharged at 20:00 last night to home.  Discharge planner states physician requested [name] to contact PCP for Home Health PT/OT/HHA order as there physicians could not write orders [place] state [place].  CSW requested d/c planner to fax d/c summary to [facility].  D/C planner states will fax d/c summary once it has been transcribed.  CSW confirmed pt did receive surgery for hip fx. CSW will contact pt/family to obtain agency preference for home health services.

## 2012-10-06 ENCOUNTER — Other Ambulatory Visit: Payer: Self-pay | Admitting: Internal Medicine

## 2012-10-06 DIAGNOSIS — S72009A Fracture of unspecified part of neck of unspecified femur, initial encounter for closed fracture: Secondary | ICD-10-CM

## 2012-10-06 NOTE — Telephone Encounter (Signed)
CSW discussed with family.  Family has used [facility] in the past and was satisfied.  Sister, Jerilynn Mages. Helene Kelp, states pt was d/c on Lovenox and M. Helene Kelp has used herself prior and is comfortable with performing injections.  CSW has yet to receive d/c summary from [facility] facility.  Call placed to [facility] with [facility], referral provided over the phone.  [facility] to [facility] on 10/07/2012. Referral completed.  Family also requesting CSW to assist with FL-2 and H&P to [facility] for evaluation of admission.

## 2012-10-07 ENCOUNTER — Encounter (HOSPITAL_COMMUNITY): Payer: Self-pay | Admitting: Emergency Medicine

## 2012-10-07 ENCOUNTER — Telehealth: Payer: Self-pay | Admitting: *Deleted

## 2012-10-07 ENCOUNTER — Emergency Department (HOSPITAL_COMMUNITY): Payer: Medicaid Other

## 2012-10-07 ENCOUNTER — Observation Stay (HOSPITAL_COMMUNITY)
Admission: EM | Admit: 2012-10-07 | Discharge: 2012-10-08 | Disposition: A | Discharge status: Skilled Nursing Facility | Payer: Medicaid Other | Attending: Emergency Medicine | Admitting: Emergency Medicine

## 2012-10-07 DIAGNOSIS — I10 Essential (primary) hypertension: Secondary | ICD-10-CM

## 2012-10-07 DIAGNOSIS — IMO0001 Reserved for inherently not codable concepts without codable children: Principal | ICD-10-CM | POA: Insufficient documentation

## 2012-10-07 DIAGNOSIS — M25559 Pain in unspecified hip: Secondary | ICD-10-CM | POA: Insufficient documentation

## 2012-10-07 DIAGNOSIS — S72009A Fracture of unspecified part of neck of unspecified femur, initial encounter for closed fracture: Secondary | ICD-10-CM

## 2012-10-07 DIAGNOSIS — R197 Diarrhea, unspecified: Secondary | ICD-10-CM | POA: Insufficient documentation

## 2012-10-07 LAB — BASIC METABOLIC PANEL
CO2: 24 mEq/L (ref 19–32)
Chloride: 112 mEq/L (ref 96–112)
Glucose, Bld: 96 mg/dL (ref 70–99)
Potassium: 4.3 mEq/L (ref 3.5–5.1)
Sodium: 144 mEq/L (ref 135–145)

## 2012-10-07 LAB — CBC WITH DIFFERENTIAL/PLATELET
Eosinophils Relative: 4 % (ref 0–5)
Lymphocytes Relative: 27 % (ref 12–46)
Lymphs Abs: 2.6 10*3/uL (ref 0.7–4.0)
MCV: 91.6 fL (ref 78.0–100.0)
Neutrophils Relative %: 61 % (ref 43–77)
Platelets: 290 10*3/uL (ref 150–400)
RBC: 2.97 MIL/uL — ABNORMAL LOW (ref 3.87–5.11)
WBC: 9.8 10*3/uL (ref 4.0–10.5)

## 2012-10-07 MED ORDER — TRAVOPROST (BAK FREE) 0.004 % OP SOLN
1.0000 [drp] | Freq: Every day | OPHTHALMIC | Status: DC
  Administered 2012-10-07: 1 [drp] via OPHTHALMIC
  Filled 2012-10-07: qty 2.5

## 2012-10-07 MED ORDER — ACETAMINOPHEN ER 650 MG PO TBCR: 650.0000 mg | EXTENDED_RELEASE_TABLET | Freq: Three times a day (TID) | ORAL | Status: DC | PRN

## 2012-10-07 MED ORDER — HYDROCODONE-ACETAMINOPHEN 5-325 MG PO TABS: 1.0000 | ORAL_TABLET | ORAL | Status: DC | PRN

## 2012-10-07 MED ORDER — PREDNISOLONE ACETATE 1 % OP SUSP
1.0000 [drp] | Freq: Once | OPHTHALMIC | Status: AC
  Administered 2012-10-07: 1 [drp] via OPHTHALMIC
  Filled 2012-10-07: qty 1

## 2012-10-07 MED ORDER — ISOSORBIDE MONONITRATE ER 60 MG PO TB24
60.0000 mg | ORAL_TABLET | Freq: Every day | ORAL | Status: DC
  Administered 2012-10-08: 60 mg via ORAL
  Filled 2012-10-07: qty 1

## 2012-10-07 MED ORDER — PREDNISOLONE ACETATE 1 % OP SUSP
1.0000 [drp] | Freq: Four times a day (QID) | OPHTHALMIC | Status: DC
  Administered 2012-10-07 – 2012-10-08 (×4): 1 [drp] via OPHTHALMIC

## 2012-10-07 MED ORDER — ENOXAPARIN SODIUM 40 MG/0.4ML ~~LOC~~ SOLN
40.0000 mg | SUBCUTANEOUS | Status: DC
  Administered 2012-10-08: 40 mg via SUBCUTANEOUS
  Filled 2012-10-07 (×2): qty 0.4

## 2012-10-07 MED ORDER — ALBUTEROL SULFATE HFA 108 (90 BASE) MCG/ACT IN AERS: 2.0000 | INHALATION_SPRAY | Freq: Four times a day (QID) | RESPIRATORY_TRACT | Status: DC | PRN

## 2012-10-07 MED ORDER — OXYCODONE-ACETAMINOPHEN 5-325 MG PO TABS
1.0000 | ORAL_TABLET | ORAL | Status: DC | PRN
  Administered 2012-10-07 – 2012-10-08 (×4): 2 via ORAL
  Filled 2012-10-07 (×4): qty 2

## 2012-10-07 MED ORDER — DIPHENOXYLATE-ATROPINE 2.5-0.025 MG/5ML PO LIQD: 10.0000 mL | Freq: Four times a day (QID) | ORAL | Status: DC | PRN

## 2012-10-07 MED ORDER — PREDNISOLONE ACETATE 1 % OP SUSP: 1.0000 [drp] | Freq: Four times a day (QID) | OPHTHALMIC | Status: DC

## 2012-10-07 MED ORDER — ACETAMINOPHEN 325 MG PO TABS: 650.0000 mg | ORAL_TABLET | Freq: Three times a day (TID) | ORAL | Status: DC | PRN

## 2012-10-07 MED ORDER — ISOSORBIDE MONONITRATE ER 60 MG PO TB24
60.0000 mg | ORAL_TABLET | Freq: Once | ORAL | Status: DC
  Filled 2012-10-07: qty 1

## 2012-10-07 MED ORDER — GABAPENTIN 300 MG PO CAPS
300.0000 mg | ORAL_CAPSULE | Freq: Three times a day (TID) | ORAL | Status: DC
  Administered 2012-10-07 – 2012-10-08 (×3): 300 mg via ORAL
  Filled 2012-10-07 (×3): qty 1

## 2012-10-07 MED ORDER — CARVEDILOL 6.25 MG PO TABS
6.2500 mg | ORAL_TABLET | Freq: Two times a day (BID) | ORAL | Status: DC
  Administered 2012-10-08 (×2): 6.25 mg via ORAL
  Filled 2012-10-07 (×3): qty 1

## 2012-10-07 MED ORDER — TRAVOPROST 0.004 % OP SOLN
1.0000 [drp] | Freq: Every day | OPHTHALMIC | Status: DC
  Filled 2012-10-07 (×4): qty 0.1

## 2012-10-07 MED ORDER — VITAMIN D (ERGOCALCIFEROL) 1.25 MG (50000 UNIT) PO CAPS: 50000.0000 [IU] | ORAL_CAPSULE | ORAL | Status: DC

## 2012-10-07 NOTE — Telephone Encounter (Signed)
[facility] called and she will have pt transported by EMS to ED.

## 2012-10-07 NOTE — ED Notes (Signed)
CSW spoke with pt who is agreeable to placement in the [place] area.  FL2 has been done and faxed out to facilities.  CSW Darden Dates will f/u with bed offers in am and facilitate pt tx to NH.  Judson Roch can be reached at [phone].

## 2012-10-07 NOTE — ED Notes (Signed)
Pt c/o left hip pain. Pt is 10 days post op from left hip fx in [place]. No f/u care since d/c from that hospital. No obvious deformity.

## 2012-10-07 NOTE — ED Notes (Signed)
Dr.Zammit on phone with SW now about pt placement

## 2012-10-07 NOTE — ED Notes (Signed)
Isac Caddy [phone]  Pt friend that can answer questions and give health hx
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
## 2012-10-07 NOTE — Consult Note (Cosign Needed)
History of Events: [name] [name] history is significant for recent [place] to [place] by [place] of state orthopedist 09/28/2013. She was discharged 10/04/2012 with recommendations for Home Health PT/OT/HHA upon return to [facility]. Clinical Education officer, museum from PCP office met with patient's sister ([name] [name]) and provided listing of [facility] and assisted with arranging for [place] to include Physical Therapy and Lovenox shots.  Physical Therapy met with pt 10/07/2012.  Discharge instructions advised non-weight bearing with post hip precautions and need to f/u with orthopedics. Pt is without Orthopedic referral. HHPT evaluate patient in her home today and noted that hospital discharge instructions were for non-weight bearing yet pt was clearly mobile and bearing weight on day of PT evaluation which results in pain 10/10.  PCP was contacted with recommendation that pt present to ED for further evaluation and assistance with rehab placement.   Orthopedics was contacted by ED physicians with recommendations for 'touch down weight bearing' for next 3 months and need for physical rehabilitation.  Internal Medicine Teaching Service was consulted for further evaluation.  At time of evaluation she was without complaints of pain and stated that her home pain medications controlled her pain well.  She was agreeable to [facility] placement for further rehab post surgery.  Inpatient Social Worker for Orthopedic floor 5N (Amy) was contacted for further recommendations to assist with SNF placement for Medicaid insurance. Given that pt would not qualify for inpatient status, it was determined that the Emergency Room Social Worker Leveda Anna) would be best to assist with SNF placement.     Objective: Vital signs in last 24 hours: Filed Vitals:   10/07/12 1139 10/07/12 1440  BP: 188/68 180/70  Pulse: 60 65  Temp: 98.4 F (36.9  C) 98.6 F (37 C)  TempSrc: Oral Oral  Resp: 20 20  SpO2: 100% 99%   Weight change:  No intake or output data in the 24 hours ending 10/07/12 1728  Physical Exam: General: Well-developed, well-nourished, in no acute distress; lying flat on stretcher Head: Normocephalic, atraumatic, PERRLA, EOMI, No signs of anemia or jaundice, Oropharynx nonerythematous, no exudate appreciated.  Neck: supple, no masses, no carotid Bruits, no JVD appreciated. Lungs: Normal respiratory effort. Clear to auscultation bilaterally from apices to bases without crackles or wheezes appreciated. Heart: normal rate, regular rhythm, normal S1 and S2 Abdomen: obese, soft, BS normoactive, non-tender.  Extremities: No pretibial edema, distal pulses intact, large clean/dry/intact bandage to lateral aspect of left upper thigh Neurologic: grossly non-focal, alert and oriented x3, appropriate and cooperative throughout examination although irritable that she has not eaten    Lab Results: Basic Metabolic Panel:  Lab 0000000 1325  NA 144  K 4.3  CL 112  CO2 24  GLUCOSE 96  BUN 21  CREATININE 1.67*  CALCIUM 8.7  MG --  PHOS --   CBC:  Lab 10/07/12 1325  WBC 9.8  NEUTROABS 6.0  HGB 9.0*  HCT 27.2*  MCV 91.6  PLT 290    Studies/Results: Dg Hip Complete Left  10/07/2012  *RADIOLOGY REPORT*  Clinical Data: Left hip pain post fall  LEFT HIP - COMPLETE 2+ VIEW  Comparison: None  Findings: Prior left acetabular reconstruction. Narrowing of left hip joint. SI joints and right hip joint space preserved. Bones demineralized. Old screw tracks at proximal left femur. No acute fracture, dislocation or bone destruction.  IMPRESSION: Post left acetabular reconstruction. No acute abnormalities.   Original Report  Authenticated By: Lavonia Dana, M.D.    Medications: I have reviewed the patient's current medications. Scheduled Meds:   Continuous Infusions:   PRN Meds:.   Assessment/Plan: 1. Status Post left  acetabular repair: cont to manage pain with prescribed outpt analgesic of Vicodin. Per Social Work, SNF placement likely available tomorrow morning, pt agreeable to overnight stay in CDU while awaiting placement. Plan of care was discussed with Ed attending Dr. Roderic Palau and Internal Medicine Attending Dr. Daryll Drown. -Pt to f/u with Dr. Onnie Graham of [facility] -f/u with PCP as needed, currently with appt tomorrow 10/08/2012 at 10:30am which may be rescheduled after SNF placement    LOS: 0 days   [place], [place] 10/07/2012, 5:28 PM

## 2012-10-07 NOTE — ED Notes (Signed)
Pt reports she was not given pain medication at time of d/c from hospital 5 days ago. Pt has pain medication in bag from home. When ask about pain meds pt reports she has been taking pain medication and ibuprofen for pain. Pt lying on stretcher watching TV at this time. NAD noted.

## 2012-10-07 NOTE — Telephone Encounter (Signed)
Should be seen in ER to decide whether to admit her to medicine or orthopedics.

## 2012-10-07 NOTE — Telephone Encounter (Signed)
Received call from [facility] with [facility] - [phone] She is in pt's home and is calling for advise. Pt was in [place] and had a fall.   She has left hip Fx, was treated there on 12/31 then sent home to [facility] on 1/6. Pt's d/c instructions state pt is non weight bearing with post hip precautions.  She needs to f/u with ortho in 14 days.  Pt was put in car and sent home, she walked from driveway into house and has been walking to bathroom each day. She has no ortho doctor and will need a referral from [place]. Pt is in severe pain, rates 10/10 and not about to get OOB today.  Swelling to leg. Also pt is on Lovenox with no instructions.   Please advise.

## 2012-10-08 ENCOUNTER — Encounter: Payer: Medicaid Other | Admitting: Internal Medicine

## 2012-10-08 ENCOUNTER — Other Ambulatory Visit: Payer: Self-pay | Admitting: Internal Medicine

## 2012-10-08 MED ORDER — OXYCODONE-ACETAMINOPHEN 5-325 MG PO TABS: 1.0000 | ORAL_TABLET | Freq: Four times a day (QID) | ORAL | Status: DC | PRN

## 2012-10-08 NOTE — ED Notes (Signed)
Patient is resting comfortably. 

## 2012-10-08 NOTE — ED Provider Notes (Signed)
Nurse/SW indicate pt has bed at [facility], [place], aned is ready for d/c there.  Pt content/alert, nad, appears stable for transport.   Mirna Mires, MD 10/08/12 [phone]

## 2012-10-08 NOTE — ED Notes (Signed)
Reports given to [facility] nursing facility.

## 2012-10-08 NOTE — ED Notes (Signed)
Patient provided with a sprite, crackers, and peanut butter per request.  No other requests at this time.  Patient resting quietly, watching TV.

## 2012-10-08 NOTE — Telephone Encounter (Signed)
Pt currently sent to ED after home health evaluation.

## 2012-10-08 NOTE — ED Notes (Signed)
Per pt, all personal belongings were taken home by her sister.

## 2012-10-08 NOTE — ED Notes (Signed)
Sarah with Social work call. States she has not heard anything regarding nursing home placement yet today.

## 2012-10-29 ENCOUNTER — Other Ambulatory Visit: Payer: Self-pay

## 2012-10-29 ENCOUNTER — Emergency Department (HOSPITAL_COMMUNITY): Payer: Medicaid Other

## 2012-10-29 ENCOUNTER — Inpatient Hospital Stay (HOSPITAL_COMMUNITY)
Admission: EM | Admit: 2012-10-29 | Discharge: 2012-11-04 | DRG: 638 | Disposition: A | Discharge status: Home / Self Care | Payer: Medicaid Other | Attending: Infectious Disease | Admitting: Infectious Disease

## 2012-10-29 ENCOUNTER — Encounter (HOSPITAL_COMMUNITY): Payer: Self-pay | Admitting: Emergency Medicine

## 2012-10-29 DIAGNOSIS — S72009A Fracture of unspecified part of neck of unspecified femur, initial encounter for closed fracture: Secondary | ICD-10-CM | POA: Diagnosis present

## 2012-10-29 DIAGNOSIS — I70219 Atherosclerosis of native arteries of extremities with intermittent claudication, unspecified extremity: Secondary | ICD-10-CM

## 2012-10-29 DIAGNOSIS — L97509 Non-pressure chronic ulcer of other part of unspecified foot with unspecified severity: Secondary | ICD-10-CM

## 2012-10-29 DIAGNOSIS — E1142 Type 2 diabetes mellitus with diabetic polyneuropathy: Secondary | ICD-10-CM | POA: Diagnosis present

## 2012-10-29 DIAGNOSIS — E1139 Type 2 diabetes mellitus with other diabetic ophthalmic complication: Secondary | ICD-10-CM

## 2012-10-29 DIAGNOSIS — E1149 Type 2 diabetes mellitus with other diabetic neurological complication: Secondary | ICD-10-CM | POA: Diagnosis present

## 2012-10-29 DIAGNOSIS — L02612 Cutaneous abscess of left foot: Secondary | ICD-10-CM

## 2012-10-29 DIAGNOSIS — E1169 Type 2 diabetes mellitus with other specified complication: Principal | ICD-10-CM | POA: Diagnosis present

## 2012-10-29 DIAGNOSIS — I70209 Unspecified atherosclerosis of native arteries of extremities, unspecified extremity: Secondary | ICD-10-CM | POA: Diagnosis present

## 2012-10-29 DIAGNOSIS — B351 Tinea unguium: Secondary | ICD-10-CM | POA: Diagnosis present

## 2012-10-29 DIAGNOSIS — R197 Diarrhea, unspecified: Secondary | ICD-10-CM

## 2012-10-29 DIAGNOSIS — L98499 Non-pressure chronic ulcer of skin of other sites with unspecified severity: Secondary | ICD-10-CM

## 2012-10-29 DIAGNOSIS — N179 Acute kidney failure, unspecified: Secondary | ICD-10-CM | POA: Diagnosis present

## 2012-10-29 DIAGNOSIS — Z8674 Personal history of sudden cardiac arrest: Secondary | ICD-10-CM

## 2012-10-29 DIAGNOSIS — D649 Anemia, unspecified: Secondary | ICD-10-CM

## 2012-10-29 DIAGNOSIS — E785 Hyperlipidemia, unspecified: Secondary | ICD-10-CM

## 2012-10-29 DIAGNOSIS — I129 Hypertensive chronic kidney disease with stage 1 through stage 4 chronic kidney disease, or unspecified chronic kidney disease: Secondary | ICD-10-CM | POA: Diagnosis present

## 2012-10-29 DIAGNOSIS — I10 Essential (primary) hypertension: Secondary | ICD-10-CM

## 2012-10-29 DIAGNOSIS — E1122 Type 2 diabetes mellitus with diabetic chronic kidney disease: Secondary | ICD-10-CM

## 2012-10-29 DIAGNOSIS — N183 Chronic kidney disease, stage 3 unspecified: Secondary | ICD-10-CM

## 2012-10-29 DIAGNOSIS — D696 Thrombocytopenia, unspecified: Secondary | ICD-10-CM

## 2012-10-29 DIAGNOSIS — Z950 Presence of cardiac pacemaker: Secondary | ICD-10-CM

## 2012-10-29 DIAGNOSIS — F17200 Nicotine dependence, unspecified, uncomplicated: Secondary | ICD-10-CM | POA: Diagnosis present

## 2012-10-29 DIAGNOSIS — Z87442 Personal history of urinary calculi: Secondary | ICD-10-CM

## 2012-10-29 DIAGNOSIS — E1129 Type 2 diabetes mellitus with other diabetic kidney complication: Secondary | ICD-10-CM | POA: Diagnosis present

## 2012-10-29 DIAGNOSIS — L0390 Cellulitis, unspecified: Secondary | ICD-10-CM

## 2012-10-29 DIAGNOSIS — I2510 Atherosclerotic heart disease of native coronary artery without angina pectoris: Secondary | ICD-10-CM

## 2012-10-29 DIAGNOSIS — E7800 Pure hypercholesterolemia, unspecified: Secondary | ICD-10-CM | POA: Diagnosis present

## 2012-10-29 DIAGNOSIS — I739 Peripheral vascular disease, unspecified: Secondary | ICD-10-CM

## 2012-10-29 DIAGNOSIS — N184 Chronic kidney disease, stage 4 (severe): Secondary | ICD-10-CM | POA: Diagnosis present

## 2012-10-29 DIAGNOSIS — F22 Delusional disorders: Secondary | ICD-10-CM

## 2012-10-29 DIAGNOSIS — Z79899 Other long term (current) drug therapy: Secondary | ICD-10-CM

## 2012-10-29 DIAGNOSIS — L97409 Non-pressure chronic ulcer of unspecified heel and midfoot with unspecified severity: Secondary | ICD-10-CM | POA: Diagnosis present

## 2012-10-29 LAB — RETICULOCYTES: RBC.: 2.72 MIL/uL — ABNORMAL LOW (ref 3.87–5.11)

## 2012-10-29 LAB — CBC WITH DIFFERENTIAL/PLATELET
Basophils Relative: 0 % (ref 0–1)
Hemoglobin: 8.1 g/dL — ABNORMAL LOW (ref 12.0–15.0)
Lymphocytes Relative: 20 % (ref 12–46)
Lymphs Abs: 2 10*3/uL (ref 0.7–4.0)
MCHC: 32.8 g/dL (ref 30.0–36.0)
Monocytes Relative: 6 % (ref 3–12)
Neutro Abs: 7.2 10*3/uL (ref 1.7–7.7)
Neutrophils Relative %: 71 % (ref 43–77)
RBC: 2.73 MIL/uL — ABNORMAL LOW (ref 3.87–5.11)
WBC: 10.1 10*3/uL (ref 4.0–10.5)

## 2012-10-29 LAB — MRSA PCR SCREENING: MRSA by PCR: NEGATIVE

## 2012-10-29 LAB — BASIC METABOLIC PANEL
BUN: 19 mg/dL (ref 6–23)
Chloride: 108 mEq/L (ref 96–112)
GFR calc Af Amer: 29 mL/min — ABNORMAL LOW (ref >90)
Potassium: 4.4 mEq/L (ref 3.5–5.1)

## 2012-10-29 LAB — HEPATIC FUNCTION PANEL
ALT: 7 U/L (ref 0–35)
Albumin: 2.3 g/dL — ABNORMAL LOW (ref 3.5–5.2)
Alkaline Phosphatase: 139 U/L — ABNORMAL HIGH (ref 39–117)
Total Bilirubin: 0.5 mg/dL (ref 0.3–1.2)
Total Protein: 6.4 g/dL (ref 6.0–8.3)

## 2012-10-29 MED ORDER — ONDANSETRON HCL 4 MG/2ML IJ SOLN
4.0000 mg | Freq: Four times a day (QID) | INTRAMUSCULAR | Status: DC | PRN
  Administered 2012-11-01 – 2012-11-04 (×2): 4 mg via INTRAVENOUS
  Filled 2012-10-29: qty 2

## 2012-10-29 MED ORDER — SODIUM CHLORIDE 0.9 % IJ SOLN
3.0000 mL | Freq: Two times a day (BID) | INTRAMUSCULAR | Status: DC
  Administered 2012-10-30 – 2012-11-02 (×3): 3 mL via INTRAVENOUS

## 2012-10-29 MED ORDER — GABAPENTIN 300 MG PO CAPS
300.0000 mg | ORAL_CAPSULE | Freq: Three times a day (TID) | ORAL | Status: DC
  Administered 2012-10-29 – 2012-11-04 (×17): 300 mg via ORAL
  Filled 2012-10-29 (×19): qty 1

## 2012-10-29 MED ORDER — SODIUM CHLORIDE 0.9 % IJ SOLN: 3.0000 mL | INTRAMUSCULAR | Status: DC | PRN

## 2012-10-29 MED ORDER — OXYCODONE-ACETAMINOPHEN 5-325 MG PO TABS
1.0000 | ORAL_TABLET | ORAL | Status: DC | PRN
  Administered 2012-10-29 – 2012-10-31 (×5): 2 via ORAL
  Filled 2012-10-29 (×5): qty 2

## 2012-10-29 MED ORDER — VANCOMYCIN HCL IN DEXTROSE 1-5 GM/200ML-% IV SOLN
1000.0000 mg | Freq: Once | INTRAVENOUS | Status: AC
  Administered 2012-10-29: 1000 mg via INTRAVENOUS
  Filled 2012-10-29: qty 200

## 2012-10-29 MED ORDER — DEXTROSE 5 % IV SOLN
2.0000 g | INTRAVENOUS | Status: DC
  Administered 2012-10-29 – 2012-10-31 (×3): 2 g via INTRAVENOUS
  Filled 2012-10-29 (×4): qty 2

## 2012-10-29 MED ORDER — PREDNISOLONE ACETATE 1 % OP SUSP
1.0000 [drp] | Freq: Four times a day (QID) | OPHTHALMIC | Status: DC
  Administered 2012-10-29 – 2012-11-04 (×22): 1 [drp] via OPHTHALMIC
  Filled 2012-10-29: qty 1

## 2012-10-29 MED ORDER — CARVEDILOL 6.25 MG PO TABS
6.2500 mg | ORAL_TABLET | Freq: Two times a day (BID) | ORAL | Status: DC
  Administered 2012-10-30 – 2012-11-04 (×11): 6.25 mg via ORAL
  Filled 2012-10-29 (×15): qty 1

## 2012-10-29 MED ORDER — SODIUM CHLORIDE 0.9 % IV SOLN: 250.0000 mL | INTRAVENOUS | Status: DC | PRN

## 2012-10-29 MED ORDER — ALBUTEROL SULFATE HFA 108 (90 BASE) MCG/ACT IN AERS
2.0000 | INHALATION_SPRAY | Freq: Four times a day (QID) | RESPIRATORY_TRACT | Status: DC | PRN
  Filled 2012-10-29: qty 6.7

## 2012-10-29 MED ORDER — ONDANSETRON HCL 4 MG PO TABS: 4.0000 mg | ORAL_TABLET | Freq: Four times a day (QID) | ORAL | Status: DC | PRN

## 2012-10-29 MED ORDER — VITAMIN D (ERGOCALCIFEROL) 1.25 MG (50000 UNIT) PO CAPS
50000.0000 [IU] | ORAL_CAPSULE | ORAL | Status: DC
  Administered 2012-10-29: 50000 [IU] via ORAL
  Filled 2012-10-29: qty 1

## 2012-10-29 MED ORDER — VANCOMYCIN HCL 1000 MG IV SOLR
750.0000 mg | INTRAVENOUS | Status: DC
  Administered 2012-10-30 – 2012-11-01 (×3): 750 mg via INTRAVENOUS
  Filled 2012-10-29 (×4): qty 750

## 2012-10-29 MED ORDER — TRAVOPROST (BAK FREE) 0.004 % OP SOLN
1.0000 [drp] | Freq: Every day | OPHTHALMIC | Status: DC
  Administered 2012-10-29 – 2012-11-03 (×6): 1 [drp] via OPHTHALMIC
  Filled 2012-10-29: qty 2.5

## 2012-10-29 MED ORDER — SODIUM CHLORIDE 0.9 % IJ SOLN: 3.0000 mL | Freq: Two times a day (BID) | INTRAMUSCULAR | Status: DC

## 2012-10-29 MED ORDER — HEPARIN SODIUM (PORCINE) 5000 UNIT/ML IJ SOLN
5000.0000 [IU] | Freq: Three times a day (TID) | INTRAMUSCULAR | Status: DC
  Administered 2012-10-29 – 2012-11-01 (×8): 5000 [IU] via SUBCUTANEOUS
  Filled 2012-10-29 (×11): qty 1

## 2012-10-29 MED ORDER — ISOSORBIDE MONONITRATE ER 60 MG PO TB24
60.0000 mg | ORAL_TABLET | Freq: Every day | ORAL | Status: DC
  Administered 2012-10-30 – 2012-11-04 (×6): 60 mg via ORAL
  Filled 2012-10-29 (×6): qty 1
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
## 2012-10-29 NOTE — ED Notes (Signed)
Notified RN of CBG [id number]
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
## 2012-10-29 NOTE — ED Notes (Signed)
IV team aware of pt and sts she is on the list.
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
## 2012-10-29 NOTE — Progress Notes (Signed)
Pt refused to get her labs drawn. Pt stated "I've been stuck too many times today." Pt also refused to watch her safety video. RN will page MD and continue to monitor pt.

## 2012-10-29 NOTE — ED Notes (Signed)
Redness noted up to left knee, leg warm and swollen

## 2012-10-29 NOTE — ED Notes (Signed)
Pt. Alert , sitting up in bed eating dinner.
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
## 2012-10-30 ENCOUNTER — Inpatient Hospital Stay (HOSPITAL_COMMUNITY): Payer: Medicaid Other

## 2012-10-30 LAB — URINALYSIS, ROUTINE W REFLEX MICROSCOPIC
Bilirubin Urine: NEGATIVE
Glucose, UA: NEGATIVE mg/dL
Hgb urine dipstick: NEGATIVE
Ketones, ur: NEGATIVE mg/dL
Leukocytes, UA: NEGATIVE
Protein, ur: 30 mg/dL — AB
pH: 5.5 (ref 5.0–8.0)

## 2012-10-30 LAB — GLUCOSE, CAPILLARY: Glucose-Capillary: 119 mg/dL — ABNORMAL HIGH (ref 70–99)

## 2012-10-30 LAB — CBC
MCH: 29.7 pg (ref 26.0–34.0)
MCHC: 32.6 g/dL (ref 30.0–36.0)
MCV: 91.3 fL (ref 78.0–100.0)
Platelets: 165 10*3/uL (ref 150–400)
RBC: 2.86 MIL/uL — ABNORMAL LOW (ref 3.87–5.11)
RDW: 14.3 % (ref 11.5–15.5)

## 2012-10-30 LAB — BASIC METABOLIC PANEL
BUN: 18 mg/dL (ref 6–23)
CO2: 21 mEq/L (ref 19–32)
Calcium: 9.2 mg/dL (ref 8.4–10.5)
Creatinine, Ser: 1.96 mg/dL — ABNORMAL HIGH (ref 0.50–1.10)
GFR calc non Af Amer: 27 mL/min — ABNORMAL LOW (ref >90)
Glucose, Bld: 112 mg/dL — ABNORMAL HIGH (ref 70–99)
Sodium: 139 mEq/L (ref 135–145)

## 2012-10-30 LAB — OCCULT BLOOD X 1 CARD TO LAB, STOOL: Fecal Occult Bld: NEGATIVE

## 2012-10-30 LAB — URINE MICROSCOPIC-ADD ON

## 2012-10-30 LAB — HIV ANTIBODY (ROUTINE TESTING W REFLEX): HIV: NONREACTIVE

## 2012-10-30 LAB — APTT: aPTT: 49 seconds — ABNORMAL HIGH (ref 24–37)

## 2012-10-30 MED ORDER — METRONIDAZOLE 500 MG PO TABS
500.0000 mg | ORAL_TABLET | Freq: Two times a day (BID) | ORAL | Status: DC
  Administered 2012-10-30 – 2012-11-04 (×11): 500 mg via ORAL
  Filled 2012-10-30 (×12): qty 1

## 2012-10-30 MED ORDER — SODIUM CHLORIDE 0.9 % IV SOLN
INTRAVENOUS | Status: DC
  Administered 2012-11-01: 06:00:00 via INTRAVENOUS

## 2012-10-30 MED ORDER — INSULIN ASPART 100 UNIT/ML ~~LOC~~ SOLN
0.0000 [IU] | Freq: Three times a day (TID) | SUBCUTANEOUS | Status: DC
  Administered 2012-11-02: 1 [IU] via SUBCUTANEOUS

## 2012-10-30 NOTE — ED Provider Notes (Signed)
Medical screening examination/treatment/procedure(s) were conducted as a shared visit with non-physician practitioner(s) and myself.  I personally evaluated the patient during the encounter   Angiocath insertion Performed by: Carmin Muskrat  Consent: Verbal consent obtained. Risks and benefits: risks, benefits and alternatives were discussed Time out: Immediately prior to procedure a "time out" was called to verify the correct patient, procedure, equipment, support staff and site/side marked as required.  Preparation: Patient was prepped and draped in the usual sterile fashion.  Vein Location: R AC  Ultrasound Guided  Gauge: 20  Normal blood return and flush without difficulty Patient tolerance: Patient tolerated the procedure well with no immediate complications.    This patient presents with concern for increasing left lower leg erythema, pain.  Notably, she has diabetes, chronic nonhealing ulcer, prior amputation of multiple digits of infected foot.  On exam, she is in no distress, with concern for progression of cellulitis, with her comorbidities she was admitted for further evaluation and management.  Carmin Muskrat, MD 10/30/12 1556

## 2012-10-30 NOTE — Progress Notes (Addendum)
Subjective: [name] [name] was seen and examined at bedside.  She wishes to take a bath as soon as possible and go home.  She has no major complaints at this time.  She denies any fever, chills, N/V/D, chest pain, shortness of breath, or abdominal pain at this time.    Objective: Vital signs in last 24 hours: Filed Vitals:   10/29/12 1829 10/29/12 2046 10/29/12 2129 10/30/12 0518  BP: 165/62 170/64 145/90 155/90  Pulse: 62 59 62 60  Temp: 99.1 F (37.3 C) 98.8 F (37.1 C)  97.9 F (36.6 C)  TempSrc: Oral Oral  Oral  Resp: 17 18  18   Height:  5\' 2"  (1.575 m)    Weight:  176 lb 4.5 oz (79.96 kg)  176 lb 4.8 oz (79.969 kg)  SpO2: 100% 100%  93%   Weight change:   Intake/Output Summary (Last 24 hours) at 10/30/12 1232 Last data filed at 10/30/12 1000  Gross per 24 hour  Intake    393 ml  Output    400 ml  Net     -7 ml   Vitals reviewed. General: resting in bed, NAD HEENT: PERRLA, EOMI, no scleral icterus Cardiac: RRR, no rubs, murmurs or gallops Pulm: clear to auscultation bilaterally, no wheezes, rales, or rhonchi Abd: soft, nontender, nondistended, BS present Ext: Dry, L foot s/p amputation of 2nd and 5th toes. Dorsum of foot with 2 flaccid bullae.  Plantar surface of 5th metatarsal with large ulcer surrounded by fluctuant, purulent fluid-filled pocket non draining and appearing necrotic.  No tenderness to palpation. No bone visualized.  Erythema, warmth and 1+ pitting edema extending from foot to below her L knee (marked w pen). R foot with large callous over lateral plantar surface but no erythema/induration/edema/fluctuance/drainage. No pedal pulses appreciated, +2 femoral pulses b/l. Neuro: alert and oriented X3, cranial nerves II-XII grossly intact, strength and sensation to light touch equal in bilateral upper and lower extremities  Lab Results: Basic Metabolic Panel:  Lab [id number] 1037 10/29/12 1258  NA 139 139  K 4.9 4.4  CL 107 108  CO2 21 20  GLUCOSE 112* 90  BUN  18 19  CREATININE 1.96* 2.10*  CALCIUM 9.2 8.9  MG -- --  PHOS -- --   Liver Function Tests:  Lab 10/29/12 2107  AST 12  ALT 7  ALKPHOS 139*  BILITOT 0.5  PROT 6.4  ALBUMIN 2.3*   CBC:  Lab 10/30/12 1037 10/29/12 1258  WBC 10.1 10.1  NEUTROABS -- 7.2  HGB 8.5* 8.1*  HCT 26.1* 24.7*  MCV 91.3 90.5  PLT 165 158   CBG:  Lab 10/30/12 1201 10/30/12 0744 10/29/12 1304  GLUCAP 101* 97 89   Coagulation:  Lab 10/30/12 1037  LABPROT 13.8  INR 1.07   Anemia Panel:  Lab 10/29/12 2107  VITAMINB12 --  FOLATE --  FERRITIN --  TIBC --  IRON --  RETICCTPCT 1.6   Urine Drug Screen: Drugs of Abuse     Component Value Date/Time   LABOPIA NEGATIVE 10/30/2009 0458   COCAINSCRNUR NEGATIVE 10/30/2009 0458   LABBENZ NEGATIVE 10/30/2009 0458   AMPHETMU NEGATIVE 10/30/2009 0458    Micro Results: Recent Results (from the past 240 hour(s))  MRSA PCR SCREENING     Status: Normal   Collection Time   10/29/12  8:32 PM      Component Value Range Status Comment   MRSA by PCR NEGATIVE  NEGATIVE Final    Studies/Results: Dg Foot Complete Left  10/29/2012  *RADIOLOGY REPORT*  Clinical Data: Cellulitis  LEFT FOOT - COMPLETE 3+ VIEW  Comparison: 07/23/2012  Findings: Three views of the left foot submitted.  Again noted status post amputation of the fifth and second toe.  Again noted surgical absence of the distal fifth metatarsal.  No acute fracture or subluxation.  No periosteal reaction or bony erosion.  IMPRESSION: No acute fracture or subluxation.  Stable postsurgical changes as described above.   Original Report Authenticated By: Lahoma Crocker, M.D.    Medications: I have reviewed the patient's current medications. Scheduled Meds:   . carvedilol  6.25 mg Oral BID WC  . ceFEPime (MAXIPIME) IV  2 g Intravenous Q24H  . gabapentin  300 mg Oral TID  . heparin  5,000 Units Subcutaneous Q8H  . insulin aspart  0-9 Units Subcutaneous TID WC  . isosorbide mononitrate  60 mg Oral Daily  .  metroNIDAZOLE  500 mg Oral Q12H  . prednisoLONE acetate  1 drop Left Eye QID  . sodium chloride  3 mL Intravenous Q12H  . sodium chloride  3 mL Intravenous Q12H  . Travoprost (BAK Free)  1 drop Both Eyes QHS  . vancomycin  750 mg Intravenous Q24H  . Vitamin D (Ergocalciferol)  50,000 Units Oral Q7 days   Continuous Infusions:   . sodium chloride     PRN Meds:.sodium chloride, albuterol, ondansetron (ZOFRAN) IV, ondansetron, oxyCODONE-acetaminophen, sodium chloride Assessment/Plan: [name] is a 62 y.o. AA female with PMH of HTN, PVD s/p bilatearl fem-pop bypasses, CKD, hyperlipidemia, and DM presenting from foot care center for concern for left foot and lower leg infection.   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  Concern for infection of left diabetic foot ulcer and lower leg cellulitis.  Very nontoxic appearing on exam, afebrile, no leukocytosis, making sepsis less likely. Foot x-ray did not show changes with OM with no acute fx or subluxation and no bone was visualized on exam, however, suspicion of deeper infection still present given size of ulcer and surrounding abscess.  Received 1 g IV vancomycin in the ED. No blood cultures drawn prior to antibiotic administration.  Ortho evaluated day of admission and recommended vascular consultation.   - Continue vancomycin day 2 and cefepime day 2 and add flagyl day 1  - f/u blood cultures x2--but expect low yield in setting of IV vancomycin upon arrival prior to cultures - Vascular consulted, Dr. Elisabeth Pigeon seen Dr. Scot Dock in the past.  Concern for viability of foot.   - CT left foot without contrast, pacemaker in place thus cannot get MRI - f/u ABI - B/L lower extremity doppler  2) HTN - Continue home Imdur and carvedilol.   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  Off of insulin since 01/2012. Blood sugar here 89.  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  - Continue home Coreg   5) ?Acute on Chronic CKD  Creatinine on admission 2.1, baseline appears ~1.6 .  Trending down.  BUN to creatinine ratio is unchanged from baseline (10).  May represent reaction to linezolid which was recently started in outpatient setting.  Eating and drinking well with no evidence of dehydration on exam. No evidence of postrenal obstruction as patient reports urinating normally with no  change in color or character of her urine.  -f/u UA  -f/u urine studies for FeNA to differentiate prerenal vs intrarenal etiology   6) Normocytic anemia  Hb on admission 8.1 with MCV 90.  Prior Hb was 9.0 three weeks ago but 13.0 in November. May represent anemia of chronic disease, however patient does have history of GI bleeding and ischemic colitis.  No recent diarrhea or blood in her stools. No symptomatic anemia or jaundice on exam.  - f/u iron panel, b12 and folate - FOBT   Diet: Carb modified DVT Ppx: heparin Dispo: Disposition is deferred at this time, awaiting improvement of current medical problems.  Anticipated discharge in approximately 3-4 day(s).  The patient does have a current PCP (SHARDA, NEEMA, MD), therefore will be requiring OPC follow-up after discharge.  The patient does not have transportation limitations that hinder transportation to clinic appointments.  Services Needed at time of discharge: Y = Yes, Blank = No PT:   OT:   RN:   Equipment:   Other:     LOS: 1 day   Jerene Pitch 10/30/2012, 12:32 PM
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
## 2012-10-30 NOTE — Progress Notes (Signed)
PT refused her am labs. Pt stated "you're not supposed to be sticking me to the phlebotomist."

## 2012-10-31 DIAGNOSIS — Z48812 Encounter for surgical aftercare following surgery on the circulatory system: Secondary | ICD-10-CM

## 2012-10-31 DIAGNOSIS — I739 Peripheral vascular disease, unspecified: Secondary | ICD-10-CM

## 2012-10-31 LAB — BASIC METABOLIC PANEL
BUN: 19 mg/dL (ref 6–23)
CO2: 19 mEq/L (ref 19–32)
Calcium: 8.9 mg/dL (ref 8.4–10.5)
Chloride: 109 mEq/L (ref 96–112)
Creatinine, Ser: 2 mg/dL — ABNORMAL HIGH (ref 0.50–1.10)
GFR calc Af Amer: 30 mL/min — ABNORMAL LOW (ref >90)
GFR calc non Af Amer: 26 mL/min — ABNORMAL LOW (ref >90)
Glucose, Bld: 95 mg/dL (ref 70–99)
Potassium: 4.4 mEq/L (ref 3.5–5.1)
Sodium: 138 mEq/L (ref 135–145)

## 2012-10-31 LAB — PHOSPHORUS: Phosphorus: 4.6 mg/dL (ref 2.3–4.6)

## 2012-10-31 LAB — CBC
Hemoglobin: 7.5 g/dL — ABNORMAL LOW (ref 12.0–15.0)
MCH: 29.4 pg (ref 26.0–34.0)
Platelets: 137 10*3/uL — ABNORMAL LOW (ref 150–400)
RBC: 2.55 MIL/uL — ABNORMAL LOW (ref 3.87–5.11)
WBC: 8.4 10*3/uL (ref 4.0–10.5)

## 2012-10-31 LAB — GLUCOSE, CAPILLARY
Glucose-Capillary: 86 mg/dL (ref 70–99)
Glucose-Capillary: 93 mg/dL (ref 70–99)
Glucose-Capillary: 101 mg/dL — ABNORMAL HIGH (ref 70–99)

## 2012-10-31 LAB — IRON AND TIBC
Iron: 57 ug/dL (ref 42–135)
Saturation Ratios: 35 % (ref 20–55)
UIBC: 107 ug/dL — ABNORMAL LOW (ref 125–400)

## 2012-10-31 LAB — MAGNESIUM: Magnesium: 1.9 mg/dL (ref 1.5–2.5)

## 2012-10-31 MED ORDER — HYDRALAZINE HCL 20 MG/ML IJ SOLN
5.0000 mg | Freq: Three times a day (TID) | INTRAMUSCULAR | Status: DC | PRN
  Administered 2012-11-04: 5 mg via INTRAVENOUS
  Filled 2012-10-31 (×2): qty 0.25

## 2012-10-31 NOTE — Progress Notes (Signed)
Subjective: Patient feels slightly better today.  Left lower shin a CT scan unremarkable for ostial moniliasis or abscess. ABI done  Objective: Vital signs in last 24 hours: Filed Vitals:   10/30/12 1503 10/30/12 2111 10/31/12 0428 10/31/12 1455  BP: 161/78 155/69 170/71 161/74  Pulse: 63 62 67 62  Temp: 98.3 F (36.8 C) 98.4 F (36.9 C) 98.3 F (36.8 C) 98.2 F (36.8 C)  TempSrc: Oral Oral Oral Oral  Resp: 20 18 18 18   Height:      Weight:      SpO2: 97% 97% 95% 100%   Weight change:   Intake/Output Summary (Last 24 hours) at 10/31/12 1844 Last data filed at 10/31/12 1816  Gross per 24 hour  Intake 2035.75 ml  Output    300 ml  Net 1735.75 ml   Vitals reviewed.  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 Dorsum of foot with 2 flaccid bullae. Plantar surface of 5th metatarsal with large ulcer surrounded by fluctuant, purulent fluid-filled pocket non draining and appearing necrotic. No tenderness to palpation. No bone visualized. Erythema, warmth and 1+ pitting edema extending from foot to below her L knee (marked w pen). R foot with large callous over lateral plantar surface but no erythema/induration/edema/fluctuance/drainage. No pedal pulses appreciated, +2 femoral pulses b/l.  Neuro: alert and oriented X3, cranial nerves II-XII grossly intact, strength and sensation to light touch equal in bilateral upper and lower extremities  Lab Results: Basic Metabolic Panel:  Lab [id number] 0650 10/30/12 1037  Mekiyah Gladwell 138 139  K 4.4 4.9  CL 109 107  CO2 19 21  GLUCOSE 95 112*  BUN 19 18  CREATININE 2.00* 1.96*  CALCIUM 8.9 9.2  MG 1.9 --  PHOS 4.6 --   Liver Function Tests:  Lab 10/29/12 2107  AST 12  ALT 7  ALKPHOS 139*  BILITOT 0.5   PROT 6.4  ALBUMIN 2.3*   CBC:  Lab 10/31/12 0650 10/30/12 1037 10/29/12 1258  WBC 8.4 10.1 --  NEUTROABS -- -- 7.2  HGB 7.5* 8.5* --  HCT 23.0* 26.1* --  MCV 90.2 91.3 --  PLT 137* 165 --   CBG:  Lab 10/31/12 1727 10/31/12 1146 10/31/12 0735 10/30/12 2109 10/30/12 1706 10/30/12 1201  GLUCAP 101* 93 86 128* 119* 101*   Coagulation:  Lab 10/30/12 1037  LABPROT 13.8  INR 1.07   Anemia Panel:  Lab 10/29/12 2107  VITAMINB12 --  FOLATE --  FERRITIN --  TIBC --  IRON --  RETICCTPCT 1.6   Urine Drug Screen: Drugs of Abuse     Component Value Date/Time   LABOPIA NEGATIVE 10/30/2009 0458   COCAINSCRNUR NEGATIVE 10/30/2009 0458   LABBENZ NEGATIVE 10/30/2009 0458   AMPHETMU NEGATIVE 10/30/2009 0458    Urinalysis:  Lab 10/30/12 1442  COLORURINE YELLOW  LABSPEC 1.017  PHURINE 5.5  GLUCOSEU NEGATIVE  HGBUR NEGATIVE  BILIRUBINUR NEGATIVE  KETONESUR NEGATIVE  PROTEINUR 30*  UROBILINOGEN 0.2  NITRITE NEGATIVE  LEUKOCYTESUR NEGATIVE   Micro Results: Recent Results (from the past 240 hour(s))  CULTURE, BLOOD (ROUTINE X 2)     Status: Normal (Preliminary result)   Collection Time   10/29/12  6:40 PM      Component Value Range Status Comment  Specimen Description BLOOD ARM LEFT   Final    Special Requests BOTTLES DRAWN AEROBIC AND ANAEROBIC 10CC   Final    Culture  Setup Time 10/30/2012 01:20   Final    Culture     Final    Value:        BLOOD CULTURE RECEIVED NO GROWTH TO DATE CULTURE WILL BE HELD FOR 5 DAYS BEFORE ISSUING A FINAL NEGATIVE REPORT   Report Status PENDING   Incomplete   CULTURE, BLOOD (ROUTINE X 2)     Status: Normal (Preliminary result)   Collection Time   10/29/12  6:46 PM      Component Value Range Status Comment   Specimen Description BLOOD HAND RIGHT   Final    Special Requests     Final    Value: BOTTLES DRAWN AEROBIC AND ANAEROBIC BLUE 10CC RED 5CC   Culture  Setup Time 10/30/2012 01:20   Final    Culture     Final    Value:        BLOOD  CULTURE RECEIVED NO GROWTH TO DATE CULTURE WILL BE HELD FOR 5 DAYS BEFORE ISSUING A FINAL NEGATIVE REPORT   Report Status PENDING   Incomplete   MRSA PCR SCREENING     Status: Normal   Collection Time   10/29/12  8:32 PM      Component Value Range Status Comment   MRSA by PCR NEGATIVE  NEGATIVE Final    Studies/Results: Ct Foot Left Wo Contrast  10/30/2012  *RADIOLOGY REPORT*  Clinical Data: Left foot abscess.  Left foot swelling.  CT OF THE LEFT FOOT WITHOUT CONTRAST  Technique:  Multidetector CT imaging was performed according to the standard protocol. Multiplanar CT image reconstructions were also generated.  Comparison: None.  Findings: Study was performed without contrast due to renal insufficiency.  Diffuse osteopenia is present.  There is no fracture.  Atrophy of the foot musculature is present diffusely. Dystrophic changes of the toenails.   There is a healed fracture of the proximal phalanx of the great toe.  Diffuse forefoot soft tissue swelling is present.  Transmetatarsal amputation of the fifth ray is present. Amputation of the phalanges of the second toe is also noted.  There is no abscess.  There is a radiodense bandage material overlying the plantar are aspect of the fourth MTP joint, presumably the Iodoform wound dressing.   No osteomyelitis is present.  IMPRESSION: Plantar forefoot soft tissue ulcer with radiopaque dressing material.  No soft tissue abscess or CT evidence of osteomyelitis. Fifth ray transmetatarsal amputation.   Original Report Authenticated By: Dereck Ligas, M.D.    Medications: I have reviewed the patient's current medications. Scheduled Meds:   . carvedilol  6.25 mg Oral BID WC  . ceFEPime (MAXIPIME) IV  2 g Intravenous Q24H  . gabapentin  300 mg Oral TID  . heparin  5,000 Units Subcutaneous Q8H  . insulin aspart  0-9 Units Subcutaneous TID WC  . isosorbide mononitrate  60 mg Oral Daily  . metroNIDAZOLE  500 mg Oral Q12H  . prednisoLONE acetate  1 drop Left  Eye QID  . sodium chloride  3 mL Intravenous Q12H  . Travoprost (BAK Free)  1 drop Both Eyes QHS  . vancomycin  750 mg Intravenous Q24H  . Vitamin D (Ergocalciferol)  50,000 Units Oral Q7 days   Continuous Infusions:   . sodium chloride 75 mL/hr at 10/31/12 0429   PRN Meds:.sodium chloride, albuterol, hydrALAZINE, ondansetron (ZOFRAN) IV,  ondansetron, oxyCODONE-acetaminophen, sodium chloride Assessment/Plan:  [name] [name] is a 62 y.o. AA female with PMH of HTN, PVD s/p bilatearl fem-pop bypasses, CKD, hyperlipidemia, and DM presenting from foot care center for concern for left foot and lower leg infection.   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 Concern for infection of left diabetic foot ulcer and lower leg cellulitis. Very nontoxic appearing on exam, afebrile, no leukocytosis, making sepsis less likely. Foot x-ray did not show changes with OM with no acute fx or subluxation and no bone was visualized on exam, however, suspicion of deeper infection still present given size of ulcer and surrounding abscess. Received 1 g IV vancomycin in the ED. No blood cultures drawn prior to antibiotic administration. Ortho evaluated day of admission and recommended vascular consultation.   - Continue vancomycin day 2 and cefepime day 2 and add flagyl day 1  - f/u blood cultures x2--but expect low yield in setting of IV vancomycin upon arrival prior to cultures  - Vascular consulted, Dr. Elisabeth Pigeon seen Dr. Scot Dock in the past. Concern for viability of foot.  - CT left foot without contrast-- no OM - f/u ABI --Left LE ABI 0.41   2) HTN  - Continue home Imdur and carvedilol.   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 Off of insulin since 01/2012. Blood sugar here 89.  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  - Continue home Coreg   5) ?Acute on Chronic CKD  Creatinine on admission 2.1, baseline appears ~1.6 . Trending down. BUN to creatinine ratio is unchanged from baseline (10). May represent reaction to linezolid which was recently started in outpatient setting. Eating and drinking well with no evidence of dehydration on exam. No evidence of postrenal obstruction as patient reports urinating normally with no change in color or character of her urine.  -f/u UA  -f/u urine studies for FeNA- pre renal  - NS 125 ml/h   6) Normocytic anemia  Hb on admission 8.1 with MCV 90. Prior Hb was 9.0 three weeks ago but 13.0 in November. May represent anemia of chronic disease, however patient does have history of GI bleeding and ischemic colitis. No recent diarrhea or blood in her stools. No symptomatic anemia or jaundice on exam.  - f/u iron panel, b12 and folate  - FOBT   Diet: Carb modified   DVT Ppx: heparin   Dispo: Disposition is deferred at this time, awaiting improvement of current medical problems. Anticipated discharge in approximately 3-4 day(s).  The patient does have a current PCP (SHARDA, NEEMA, MD), therefore will be requiring OPC follow-up after discharge.  The patient does not have transportation limitations that hinder transportation to clinic appointments.       .Services Needed at time of discharge: Y = Yes, Blank = No PT:   OT:   RN:   Equipment:   Other:     LOS: 2 days   Alberta Cairns 10/31/2012, 6:44 PM

## 2012-10-31 NOTE — Progress Notes (Addendum)
VASCULAR LAB PRELIMINARY  ARTERIAL *PRELIMINARY RESULTS* Vascular Ultrasound Lower Extremity Arterial Duplex has been completed.  ABI completed:    RIGHT    LEFT    PRESSURE WAVEFORM  PRESSURE WAVEFORM  BRACHIAL Unable to obtain due to IV placement Triphasic BRACHIAL 162 Biphasic  DP 147 Monophasic DP 66 Monophasic  AT   AT    PT 166 Monophasic PT  Unable to insonate.  PER   PER    GREAT TOE  NA GREAT TOE  NA    RIGHT LEFT  ABI 1.02 0.41   The right ABI is within normal limits. The left ABI is suggestive of severe arterial insufficiency.   The right femoral-popliteal bypass graft is patent. The left femoral-popliteal bypass graft is patent, with areas of stenosis at the proximal anastomosis, and at the distal thigh/proximal popliteal fossa. Unable to insonate the left posterior tibial and peroneal arteries.  Preliminary findings discussed with Dr.Early.  10/31/2012 9:54 AM Maudry Mayhew, RDMS, RDCS

## 2012-10-31 NOTE — Progress Notes (Signed)
Internal Medicine Teaching Service Attending Note Date: 10/31/2012  Patient name: [name] [name]  Medical record number: [id number]  Date of birth: 01-Apr-1951    This patient has been seen and discussed with the house staff. Please see their note for complete details. I concur with their findings with the following additions/corrections:  Greatly appreciate Dr. Luther Parody help with this complicated pt. CT was unrevealing for osteo or abscess yesterday but we remain concerned by appearance of left foot with her necrotic ulcers. Erythema is appearing slightly better today in lower leg.  We will continue current abx and fu with [facility]VS and Orthopedics.    Alcide Evener 10/31/2012, 1:02 PM

## 2012-10-31 NOTE — Progress Notes (Signed)
Subjective: Interval History: none.. comfortable sitting up at the bedside  Objective: Vital signs in last 24 hours: Temp:  [98.3 F (36.8 C)-98.4 F (36.9 C)] 98.3 F (36.8 C) (02/02 0428) Pulse Rate:  [62-67] 67  (02/02 0428) Resp:  [18-20] 18  (02/02 0428) BP: (155-170)/(69-78) 170/71 mmHg (02/02 0428) SpO2:  [95 %-97 %] 95 % (02/02 0428)  Intake/Output from previous day: 02/01 0701 - 02/02 0700 In: 731.8 [P.O.:340; I.V.:191.8; IV Piggyback:200] Out: 700 [Urine:700] Intake/Output this shift: Total I/O In: 112.5 [I.V.:112.5] Out: -   No change in her lower trim the exam. Marked edema bilaterally. Mild erythema on her left leg from her knee distally. No change in her left foot exam from yesterday with eschar present over her prior fifth ray amputation  Lab Results:  [facility] 10/31/12 0650 10/30/12 1037  WBC 8.4 10.1  HGB 7.5* 8.5*  HCT 23.0* 26.1*  PLT 137* 165   BMET  Basename 10/31/12 0650 10/30/12 1037  NA 138 139  K 4.4 4.9  CL 109 107  CO2 19 21  GLUCOSE 95 112*  BUN 19 18  CREATININE 2.00* 1.96*  CALCIUM 8.9 9.2    Studies/Results: Dg Hip Complete Left  10/07/2012  *RADIOLOGY REPORT*  Clinical Data: Left hip pain post fall  LEFT HIP - COMPLETE 2+ VIEW  Comparison: None  Findings: Prior left acetabular reconstruction. Narrowing of left hip joint. SI joints and right hip joint space preserved. Bones demineralized. Old screw tracks at proximal left femur. No acute fracture, dislocation or bone destruction.  IMPRESSION: Post left acetabular reconstruction. No acute abnormalities.   Original Report Authenticated By: Lavonia Dana, M.D.    Ct Foot Left Wo Contrast  10/30/2012  *RADIOLOGY REPORT*  Clinical Data: Left foot abscess.  Left foot swelling.  CT OF THE LEFT FOOT WITHOUT CONTRAST  Technique:  Multidetector CT imaging was performed according to the standard protocol. Multiplanar CT image reconstructions were also generated.  Comparison: None.  Findings: Study was  performed without contrast due to renal insufficiency.  Diffuse osteopenia is present.  There is no fracture.  Atrophy of the foot musculature is present diffusely. Dystrophic changes of the toenails.   There is a healed fracture of the proximal phalanx of the great toe.  Diffuse forefoot soft tissue swelling is present.  Transmetatarsal amputation of the fifth ray is present. Amputation of the phalanges of the second toe is also noted.  There is no abscess.  There is a radiodense bandage material overlying the plantar are aspect of the fourth MTP joint, presumably the Iodoform wound dressing.   No osteomyelitis is present.  IMPRESSION: Plantar forefoot soft tissue ulcer with radiopaque dressing material.  No soft tissue abscess or CT evidence of osteomyelitis. Fifth ray transmetatarsal amputation.   Original Report Authenticated By: Dereck Ligas, M.D.    Dg Foot Complete Left  10/29/2012  *RADIOLOGY REPORT*  Clinical Data: Cellulitis  LEFT FOOT - COMPLETE 3+ VIEW  Comparison: 07/23/2012  Findings: Three views of the left foot submitted.  Again noted status post amputation of the fifth and second toe.  Again noted surgical absence of the distal fifth metatarsal.  No acute fracture or subluxation.  No periosteal reaction or bony erosion.  IMPRESSION: No acute fracture or subluxation.  Stable postsurgical changes as described above.   Original Report Authenticated By: Lahoma Crocker, M.D.    Anti-infectives: Anti-infectives     Start     Dose/Rate Route Frequency Ordered Stop   10/30/12 1600  vancomycin (VANCOCIN) 750 mg in sodium chloride 0.9 % 150 mL IVPB        750 mg 150 mL/hr over 60 Minutes Intravenous Every 24 hours 10/29/12 1820     10/30/12 1000   metroNIDAZOLE (FLAGYL) tablet 500 mg        500 mg Oral Every 12 hours 10/30/12 0754     10/29/12 1900   ceFEPIme (MAXIPIME) 2 g in dextrose 5 % 50 mL IVPB        2 g 100 mL/hr over 30 Minutes Intravenous Every 24 hours 10/29/12 1820     10/29/12  1315   vancomycin (VANCOCIN) IVPB 1000 mg/200 mL premix        1,000 mg 200 mL/hr over 60 Minutes Intravenous  Once 10/29/12 1300 10/29/12 1827          Assessment/Plan: s/p * No surgery found * Ongoing left leg erythema. She is to have a duplex of her graft to see if they are patent. I do not palpate a left popliteal pulse but this may well be due to her edema. I did explain that her CT scan showed no evidence of abscess or other issues that would prevent her from being able to heal her foot. Will followup tomorrow with results of her duplex   LOS: 2 days   [name] [name] 10/31/2012, 10:20 AM

## 2012-11-01 ENCOUNTER — Encounter (HOSPITAL_COMMUNITY)
Admission: EM | Disposition: A | Discharge status: Home / Self Care | Payer: Self-pay | Source: Home / Self Care | Attending: Infectious Disease

## 2012-11-01 DIAGNOSIS — L98499 Non-pressure chronic ulcer of skin of other sites with unspecified severity: Secondary | ICD-10-CM

## 2012-11-01 DIAGNOSIS — I739 Peripheral vascular disease, unspecified: Secondary | ICD-10-CM

## 2012-11-01 HISTORY — PX: LOWER EXTREMITY ANGIOGRAM: SHX5508

## 2012-11-01 LAB — CBC
HCT: 22.1 % — ABNORMAL LOW (ref 36.0–46.0)
MCH: 29 pg (ref 26.0–34.0)
MCHC: 32.1 g/dL (ref 30.0–36.0)
MCHC: 32 g/dL (ref 30.0–36.0)
MCV: 90.6 fL (ref 78.0–100.0)
MCV: 90.6 fL (ref 78.0–100.0)
Platelets: 101 10*3/uL — ABNORMAL LOW (ref 150–400)
Platelets: 103 10*3/uL — ABNORMAL LOW (ref 150–400)
RDW: 14.4 % (ref 11.5–15.5)
RDW: 14.5 % (ref 11.5–15.5)
WBC: 7.2 10*3/uL (ref 4.0–10.5)

## 2012-11-01 LAB — BASIC METABOLIC PANEL
BUN: 22 mg/dL (ref 6–23)
Creatinine, Ser: 1.95 mg/dL — ABNORMAL HIGH (ref 0.50–1.10)
GFR calc Af Amer: 31 mL/min — ABNORMAL LOW (ref >90)
GFR calc non Af Amer: 27 mL/min — ABNORMAL LOW (ref >90)
Potassium: 4.4 mEq/L (ref 3.5–5.1)

## 2012-11-01 LAB — VITAMIN B12: Vitamin B-12: 568 pg/mL (ref 211–911)

## 2012-11-01 LAB — GLUCOSE, CAPILLARY: Glucose-Capillary: 100 mg/dL — ABNORMAL HIGH (ref 70–99)

## 2012-11-01 LAB — FOLATE: Folate: 5.4 ng/mL

## 2012-11-01 LAB — CREATININE, SERUM: Creatinine, Ser: 1.79 mg/dL — ABNORMAL HIGH (ref 0.50–1.10)

## 2012-11-01 LAB — SURGICAL PCR SCREEN
MRSA, PCR: NEGATIVE
Staphylococcus aureus: NEGATIVE

## 2012-11-01 SURGERY — ANGIOGRAM, LOWER EXTREMITY
Anesthesia: LOCAL | Laterality: Left

## 2012-11-01 MED ORDER — ACETAMINOPHEN 325 MG PO TABS
650.0000 mg | ORAL_TABLET | ORAL | Status: DC | PRN
  Administered 2012-11-01: 650 mg via ORAL
  Filled 2012-11-01: qty 2

## 2012-11-01 MED ORDER — ONDANSETRON HCL 4 MG/2ML IJ SOLN
INTRAMUSCULAR | Status: AC
  Filled 2012-11-01: qty 2

## 2012-11-01 MED ORDER — LEVOFLOXACIN IN D5W 750 MG/150ML IV SOLN
750.0000 mg | INTRAVENOUS | Status: DC
  Administered 2012-11-01 – 2012-11-03 (×2): 750 mg via INTRAVENOUS
  Filled 2012-11-01 (×2): qty 150

## 2012-11-01 MED ORDER — ENOXAPARIN SODIUM 30 MG/0.3ML ~~LOC~~ SOLN
30.0000 mg | SUBCUTANEOUS | Status: DC
  Filled 2012-11-01 (×2): qty 0.3

## 2012-11-01 MED ORDER — HEPARIN (PORCINE) IN NACL 2-0.9 UNIT/ML-% IJ SOLN
INTRAMUSCULAR | Status: AC
  Filled 2012-11-01: qty 1000

## 2012-11-01 MED ORDER — SODIUM CHLORIDE 0.9 % IV SOLN
1.0000 mL/kg/h | INTRAVENOUS | Status: AC
  Administered 2012-11-01 (×2): 1 mL/kg/h via INTRAVENOUS

## 2012-11-01 MED ORDER — MIDAZOLAM HCL 2 MG/2ML IJ SOLN
INTRAMUSCULAR | Status: AC
  Filled 2012-11-01: qty 2

## 2012-11-01 MED ORDER — FENTANYL CITRATE 0.05 MG/ML IJ SOLN
INTRAMUSCULAR | Status: AC
  Filled 2012-11-01: qty 2

## 2012-11-01 MED ORDER — LIDOCAINE HCL (PF) 1 % IJ SOLN
INTRAMUSCULAR | Status: AC
  Filled 2012-11-01: qty 30

## 2012-11-01 MED ORDER — ONDANSETRON HCL 4 MG/2ML IJ SOLN: 4.0000 mg | Freq: Four times a day (QID) | INTRAMUSCULAR | Status: DC | PRN

## 2012-11-01 NOTE — Interval H&P Note (Signed)
History and Physical Interval Note:  11/01/2012 10:23 AM  [name] [name]  has presented today for surgery, with the diagnosis of claudication  The various methods of treatment have been discussed with the patient and family. After consideration of risks, benefits and other options for treatment, the patient has consented to  Procedure(s) (LRB) with comments: LOWER EXTREMITY ANGIOGRAM (Left) as a surgical intervention .  The patient's history has been reviewed, patient examined, no change in status, stable for surgery.  I have reviewed the patient's chart and labs.  Questions were answered to the patient's satisfaction.     Tina Temme S

## 2012-11-01 NOTE — Progress Notes (Addendum)
Subjective: [name] [name] was seen and examined at bedside.  She was sleeping but reports no overnight events or complaints.  She was seen by Dr. Scot Dock, vascular, this AM and is agreeable to CO2 arteriogram.  ABI was done 40% on left.  Denies fever, chills, N/V/D, chest pain, shortness of breath, or abdominal pain at this time.    Objective: Vital signs in last 24 hours: Filed Vitals:   10/31/12 0428 10/31/12 1455 10/31/12 2209 11/01/12 0445  BP: 170/71 161/74 157/72 161/69  Pulse: 67 62 65 63  Temp: 98.3 F (36.8 C) 98.2 F (36.8 C) 98.3 F (36.8 C) 98.1 F (36.7 C)  TempSrc: Oral Oral Oral Oral  Resp: 18 18 18 18   Height:      Weight:    179 lb 8 oz (81.421 kg)  SpO2: 95% 100% 95% 93%   Weight change:   Intake/Output Summary (Last 24 hours) at 11/01/12 0805 Last data filed at 11/01/12 0651  Gross per 24 hour  Intake 3184.09 ml  Output      0 ml  Net 3184.09 ml   Vitals reviewed.  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 Dorsum of foot with 2 flaccid bullae. Plantar surface of 5th metatarsal with large ulcer surrounded by fluctuant, purulent fluid-filled pocket non draining and appearing necrotic. No tenderness to palpation. No bone visualized. Erythema, warmth and 1+ pitting edema extending from foot to below her L knee (marked w pen). R foot with large callous over lateral plantar surface but no erythema/induration/edema/fluctuance/drainage. No pedal pulses appreciated, +2 femoral pulses b/l.  Neuro: alert and oriented X3, cranial nerves II-XII grossly intact, strength and sensation to light touch equal in bilateral upper and lower extremities  Lab Results: Basic Metabolic Panel:  Lab [id number] 0550 10/31/12 0650  NA 137 138  K  4.4 4.4  CL 109 109  CO2 19 19  GLUCOSE 97 95  BUN 22 19  CREATININE 1.95* 2.00*  CALCIUM 8.4 8.9  MG -- 1.9  PHOS -- 4.6   Liver Function Tests:  Lab 10/29/12 2107  AST 12  ALT 7  ALKPHOS 139*  BILITOT 0.5  PROT 6.4  ALBUMIN 2.3*   CBC:  Lab 11/01/12 0550 10/31/12 0650 10/29/12 1258  WBC 7.2 8.4 --  NEUTROABS -- -- 7.2  HGB 7.1* 7.5* --  HCT 22.1* 23.0* --  MCV 90.6 90.2 --  PLT PENDING 137* --   CBG:  Lab 10/31/12 2206 10/31/12 1727 10/31/12 1146 10/31/12 0735 10/30/12 2109 10/30/12 1706  GLUCAP 112* 101* 93 86 128* 119*   Coagulation:  Lab 10/30/12 1037  LABPROT 13.8  INR 1.07   Anemia Panel:  Lab 10/29/12 2107  VITAMINB12 568  FOLATE 5.4  FERRITIN 525*  TIBC 164*  IRON 57  RETICCTPCT 1.6   Urine Drug Screen: Drugs of Abuse     Component Value Date/Time   LABOPIA NEGATIVE 10/30/2009 0458   COCAINSCRNUR NEGATIVE 10/30/2009 0458   LABBENZ NEGATIVE 10/30/2009 0458   AMPHETMU NEGATIVE 10/30/2009 0458    Urinalysis:  Lab 10/30/12 1442  COLORURINE YELLOW  LABSPEC 1.017  PHURINE 5.5  GLUCOSEU NEGATIVE  HGBUR NEGATIVE  BILIRUBINUR NEGATIVE  KETONESUR NEGATIVE  PROTEINUR 30*  UROBILINOGEN 0.2  NITRITE NEGATIVE  LEUKOCYTESUR NEGATIVE   Micro Results: Recent Results (from the past 240 hour(s))  CULTURE, BLOOD (ROUTINE X 2)     Status: Normal (Preliminary result)   Collection Time   10/29/12  6:40 PM      Component Value Range Status Comment   Specimen Description BLOOD ARM LEFT   Final    Special Requests BOTTLES DRAWN AEROBIC AND ANAEROBIC 10CC   Final    Culture  Setup Time 10/30/2012 01:20   Final    Culture     Final    Value:        BLOOD CULTURE RECEIVED NO GROWTH TO DATE CULTURE WILL BE HELD FOR 5 DAYS BEFORE ISSUING A FINAL NEGATIVE REPORT   Report Status PENDING   Incomplete   CULTURE, BLOOD (ROUTINE X 2)     Status: Normal (Preliminary result)   Collection Time   10/29/12  6:46 PM      Component Value Range Status Comment   Specimen  Description BLOOD HAND RIGHT   Final    Special Requests     Final    Value: BOTTLES DRAWN AEROBIC AND ANAEROBIC BLUE 10CC RED 5CC   Culture  Setup Time 10/30/2012 01:20   Final    Culture     Final    Value:        BLOOD CULTURE RECEIVED NO GROWTH TO DATE CULTURE WILL BE HELD FOR 5 DAYS BEFORE ISSUING A FINAL NEGATIVE REPORT   Report Status PENDING   Incomplete   MRSA PCR SCREENING     Status: Normal   Collection Time   10/29/12  8:32 PM      Component Value Range Status Comment   MRSA by PCR NEGATIVE  NEGATIVE Final    Studies/Results: Ct Foot Left Wo Contrast  10/30/2012  *RADIOLOGY REPORT*  Clinical Data: Left foot abscess.  Left foot swelling.  CT OF THE LEFT FOOT WITHOUT CONTRAST  Technique:  Multidetector CT imaging was performed according to the standard protocol. Multiplanar CT image reconstructions were also generated.  Comparison: None.  Findings: Study was performed without contrast due to renal insufficiency.  Diffuse osteopenia is present.  There is no fracture.  Atrophy of the foot musculature is present diffusely. Dystrophic changes of the toenails.   There is a healed fracture of the proximal phalanx of the great toe.  Diffuse forefoot soft tissue swelling is present.  Transmetatarsal amputation of the fifth ray is present. Amputation of the phalanges of the second toe is also noted.  There is no abscess.  There is a radiodense bandage material overlying the plantar are aspect of the fourth MTP joint, presumably the Iodoform wound dressing.   No osteomyelitis is present.  IMPRESSION: Plantar forefoot soft tissue ulcer with radiopaque dressing material.  No soft tissue abscess or CT evidence of osteomyelitis. Fifth ray transmetatarsal amputation.   Original Report Authenticated By: Dereck Ligas, M.D.    Medications: I have reviewed the patient's current medications. Scheduled Meds:    . carvedilol  6.25 mg Oral BID WC  . ceFEPime (MAXIPIME) IV  2 g Intravenous Q24H  .  gabapentin  300 mg Oral TID  . heparin  5,000 Units Subcutaneous Q8H  . insulin aspart  0-9 Units Subcutaneous TID WC  . isosorbide mononitrate  60 mg Oral Daily  . metroNIDAZOLE  500 mg Oral Q12H  . prednisoLONE acetate  1 drop Left Eye QID  . sodium chloride  3 mL Intravenous Q12H  . Travoprost (BAK  Free)  1 drop Both Eyes QHS  . vancomycin  750 mg Intravenous Q24H  . Vitamin D (Ergocalciferol)  50,000 Units Oral Q7 days   Continuous Infusions:    . sodium chloride 125 mL/hr at 11/01/12 0559   PRN Meds:.sodium chloride, albuterol, hydrALAZINE, ondansetron (ZOFRAN) IV, ondansetron, oxyCODONE-acetaminophen, sodium chloride Assessment/Plan:  [name] [name] is a 62 y.o. AA female with PMH of HTN, PVD s/p bilatearl fem-pop bypasses, CKD, hyperlipidemia, and DM presenting from foot care center for concern for left foot and lower leg infection.   1) L foot ulcer with severe tibial artery occlusive disease, ?lower leg cellulitis--s/p amputation of L 2nd and 5th toes and poor wound healing in setting of severe PVD.  Nontoxic appearing on exam, afebrile, no leukocytosis, making sepsis less likely.  Foot x-ray did not show changes with OM with no acute fx or subluxation and no bone was visualized on exam.  CT left foot: plantar forefoot soft tissue ulceration, no abscess or OM evidence. Prelim ABI: R 1.02 and L 0.41 with patent R and L fem-pop bypass grafts.  Received 1 g IV vancomycin in the ED.  No blood cultures drawn prior to antibiotic administration. Ortho evaluated day of admission and recommended vascular consultation.  - Continue vancomycin day 4 and cefepime day 4 and add flagyl day 3  - f/u blood cultures x2--NGTD, but expect low yield in setting of IV vancomycin upon arrival prior to cultures  - Vascular following, Dr. Scot Dock, appreciate their input--limb threatening situation, change of limb salvage with arteriography and evaluate for revascularization.  Proceed with CO2 arteriogram.      2) HTN  - Continue home Imdur and carvedilol.   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 Off of insulin since 01/2012. Blood sugar here 89.  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  - Continue home Coreg   5) ?Acute on Chronic CKD   Lab 11/01/12 0550 10/31/12 0650 10/30/12 1037 10/29/12 1258  CREATININE 1.95* 2.00* 1.96* 2.10*   Creatinine on admission 2.1, baseline appears ~1.6 . Trending down. May represent reaction to linezolid which was recently started in outpatient setting. Eating and drinking well with no evidence of dehydration on exam. No evidence of postrenal obstruction as patient reports urinating normally with no change in color or character of her urine.  UA neg for bacteria, UNA 62, UCr 109.77--FeNA 0.07% suggestive of pre-renal - IVF  6) Normocytic anemia   Lab 11/01/12 0550 10/31/12 0650 10/30/12 1037  HGB 7.1* 7.5* 8.5*  HCT 22.1* 23.0* 26.1*  WBC 7.2 8.4 10.1  PLT 101* 137* 165   Hb on admission 8.1 with MCV 90. Prior Hb was 9.0 three weeks ago but 13.0 in November.  May represent anemia of chronic disease, however patient does have history of GI bleeding and ischemic colitis.  No recent diarrhea or blood in her stools. FOBT negative.  No symptomatic anemia or jaundice on exam.  Iron panel: Iron 57, TIBC 164, UIBC 107, b12 568 and folate 5.4.   -Hb trending down, 7.1 today, no active signs of bleeding -Continue to trend CBC  7) Thrombocytopenia  Lab 11/01/12 0550 10/31/12 0650 10/30/12 1037  HGB 7.1* 7.5* 8.5*  HCT 22.1* 23.0* 26.1*  WBC 7.2 8.4 10.1  PLT 101* 137* 165   -Platelets trending down from admission (158).  Possibly secondary to recent antibiotic  use (on cefepime and vancomycin and was on Linezolid) vs chronic illness,  ?HIT.  Platelets were already low on admission and on lovenox prior to arrival.   -d/c Cefepime -Trend platelets    Diet: Carb modified   DVT Ppx: heparin   Dispo: Disposition is deferred at this time, awaiting improvement of current medical problems. Anticipated discharge in approximately 3-4 day(s).   The patient does have a current PCP (SHARDA, NEEMA, MD), therefore will be requiring OPC follow-up after discharge.  The patient does not have transportation limitations that hinder transportation to clinic appointments.   .Services Needed at time of discharge: Y = Yes, Blank = No PT:   OT:   RN:   Equipment:   Other:     LOS: 3 days   Jerene Pitch 11/01/2012, 8:05 AM  Internal Medicine Teaching Service Attending Note Date: 11/01/2012  Patient name: [name] [name]  Medical record number: [id number]  Date of birth: [birth year]    This patient has been seen and discussed with the house staff. Please see their note for complete details. I concur with their findings with the following additions/corrections:  Am bothered by the decline in her platelets. We'll stop the beta-lactam in case this is responsible for her worsening thrombocytopenia.  We'll Institute levofloxacin for gram-negative coverage  If thrombus cytopenia fails to improve we will also discontinue vancomycin, discontinue heparin and send a hit antibody panel though my suspicion for the latter is lower.  Rhina Brackett Dam 11/01/2012, 1:08 PM

## 2012-11-01 NOTE — H&P (View-Only) (Signed)
VASCULAR PROGRESS NOTE  SUBJECTIVE: No specific complaints.  PHYSICAL EXAM: Filed Vitals:   10/31/12 0428 10/31/12 1455 10/31/12 2209 11/01/12 0445  BP: 170/71 161/74 157/72 161/69  Pulse: 67 62 65 63  Temp: 98.3 F (36.8 C) 98.2 F (36.8 C) 98.3 F (36.8 C) 98.1 F (36.7 C)  TempSrc: Oral Oral Oral Oral  Resp: 18 18 18 18   Height:      Weight:    179 lb 8 oz (81.421 kg)  SpO2: 95% 100% 95% 93%   Nonhealing wound on the lateral aspect of left foot.  LABS: Lab Results  Component Value Date   WBC 7.2 11/01/2012   HGB 7.1* 11/01/2012   HCT 22.1* 11/01/2012   MCV 90.6 11/01/2012   PLT PENDING 11/01/2012   Lab Results  Component Value Date   CREATININE 1.95* 11/01/2012   Lab Results  Component Value Date   INR 1.07 10/30/2012   CBG (last 3)   Basename 10/31/12 2206 10/31/12 1727 10/31/12 1146  GLUCAP 112* 101* 93    Principal Problem:  *Abscess of left foot Active Problems:  HYPERLIPIDEMIA  TOBACCO USER  HYPERTENSION  PERIPHERAL VASCULAR DISEASE  Type 2 diabetes mellitus with diabetic chronic kidney disease  FOOT ULCER   ASSESSMENT AND PLAN: 1. Nonhealing wound on the lateral aspect of left foot with severe tibial artery occlusive disease and patent left femoropopliteal bypass graft. ABI is 40% on the left. This is clearly a limb threatening situation. I think without revascularization the wound will progress and she will require a proximal amputation. I believe that the only chance for limb salvage would be to proceed with arteriography and evaluate her for revascularization. Given her renal insufficiency she is not a good candidate for CT angiogram. I have recommended a CO2 arteriogram. We have discussed the potential complications of the procedure including the risk of compromising her bypass grafts. We have also discussed the potential risk of bleeding or injury the artery unpredictable problems. She is agreeable to proceed. We will make further recommendations pending the  results of her arteriogram. Of note her CT scan did not show any evidence of abscess in the left foot or evidence of osteomyelitis.  Gae Gallop [id number] 11/01/2012

## 2012-11-01 NOTE — Progress Notes (Signed)
VASCULAR PROGRESS NOTE  SUBJECTIVE: No specific complaints.  PHYSICAL EXAM: Filed Vitals:   10/31/12 0428 10/31/12 1455 10/31/12 2209 11/01/12 0445  BP: 170/71 161/74 157/72 161/69  Pulse: 67 62 65 63  Temp: 98.3 F (36.8 C) 98.2 F (36.8 C) 98.3 F (36.8 C) 98.1 F (36.7 C)  TempSrc: Oral Oral Oral Oral  Resp: 18 18 18 18   Height:      Weight:    179 lb 8 oz (81.421 kg)  SpO2: 95% 100% 95% 93%   Nonhealing wound on the lateral aspect of left foot.  LABS: Lab Results  Component Value Date   WBC 7.2 11/01/2012   HGB 7.1* 11/01/2012   HCT 22.1* 11/01/2012   MCV 90.6 11/01/2012   PLT PENDING 11/01/2012   Lab Results  Component Value Date   CREATININE 1.95* 11/01/2012   Lab Results  Component Value Date   INR 1.07 10/30/2012   CBG (last 3)   Basename 10/31/12 2206 10/31/12 1727 10/31/12 1146  GLUCAP 112* 101* 93    Principal Problem:  *Abscess of left foot Active Problems:  HYPERLIPIDEMIA  TOBACCO USER  HYPERTENSION  PERIPHERAL VASCULAR DISEASE  Type 2 diabetes mellitus with diabetic chronic kidney disease  FOOT ULCER   ASSESSMENT AND PLAN: 1. Nonhealing wound on the lateral aspect of left foot with severe tibial artery occlusive disease and patent left femoropopliteal bypass graft. ABI is 40% on the left. This is clearly a limb threatening situation. I think without revascularization the wound will progress and she will require a proximal amputation. I believe that the only chance for limb salvage would be to proceed with arteriography and evaluate her for revascularization. Given her renal insufficiency she is not a good candidate for CT angiogram. I have recommended a CO2 arteriogram. We have discussed the potential complications of the procedure including the risk of compromising her bypass grafts. We have also discussed the potential risk of bleeding or injury the artery unpredictable problems. She is agreeable to proceed. We will make further recommendations pending the  results of her arteriogram. Of note her CT scan did not show any evidence of abscess in the left foot or evidence of osteomyelitis.  Gae Gallop [id number] 11/01/2012
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
## 2012-11-01 NOTE — Op Note (Signed)
PATIENT: [name] [name]   MRN: [id number] DOB: 04-01-1951    DATE OF PROCEDURE: 11/01/2012  INDICATIONS: [name] [name] is a 62 y.o. female with a nonhealing ulcer of the left foot and severe tibial artery occlusive disease  PROCEDURE:  1. Ultrasound-guided access to the right common femoral artery 2. Selective catheterization of the left external iliac artery with CO2 arteriography 3. Selective catheterization of the left superficial femoral artery with left lower tree arteriogram using a total of 35 cc of contrast  SURGEON: Judeth Cornfield. Scot Dock, MD, FACS  ANESTHESIA: local with sedation   EBL: minimal  TECHNIQUE: The patient was brought to the peripheral vascular lab and sedated with milligram of Versed and 50 mcg of fentanyl. She later received an additional 25 mcg of fentanyl. Both groins were prepped and draped in the usual sterile fashion. After the skin was anesthetized with 1% lidocaine and under ultrasound guidance the right common femoral artery was cannulated and a guidewire introduced into the infrarenal aorta under fluoroscopic control. A crossover catheter was placed in the left common iliac artery. The wire was advanced down into the external iliac artery and a crossover catheter exchanged for an interval catheter. Selective left external iliac arteriogram was obtained using CO2. I believe I got excellent visualization down to the level of the knee. However below that she was very uncomfortable with CO2 and we could not obtain adequate visualization. Therefore I elected to use a small amount of contrast. The wire was advanced into the superficial femoral artery and the catheter advanced into the superficial femoral artery over the wire. Using 2 contrast injections the tibial vessels were visualized to the foot. At the completion of the procedure the endhole catheter was removed. The patient was transferred to holding for sheath removal.  FINDINGS:  1. The left common femoral artery is  patent with some mild stenosis above her bypass graft. The deep femoral artery is patent. The superficial femoral artery is occluded at its origin. The femoropopliteal bypass graft is widely patent and is anastomosed to the above-knee popliteal artery. There is some stenosis just beneath the anastomosis and then the popliteal artery is occluded. There is reconstitution of the peroneal artery and anterior tibial artery. There is some mild diffuse disease in the proximal anterior tibial artery. The posterior tibial artery is occluded. The dorsalis pedis artery has moderate diffuse disease.  CLINICAL NOTE: A total of 35 cc of contrast were used. She is to have a follow up creatinine tomorrow we'll follow her renal function area also obtained vein mapping of both lower extremities to see if she has any vein that could potentially be used for a bypass. Likely her only option for revascularization would be a femoral to anterior tibial artery bypass, laterally using a composite PTFE and vein graft. Given the disease in the foot even with revascularization she is at high risk for limb loss. I will discuss the potential risks and benefits of surgery with her once her vein mapping is complete. Surgery would not have to be done urgently and she could potentially be discharged and arrange this electively.  [name] Mayo, MD, FACS [facility]  DATE OF DICTATION:   11/01/2012

## 2012-11-02 DIAGNOSIS — L02619 Cutaneous abscess of unspecified foot: Secondary | ICD-10-CM

## 2012-11-02 DIAGNOSIS — L98499 Non-pressure chronic ulcer of skin of other sites with unspecified severity: Secondary | ICD-10-CM

## 2012-11-02 DIAGNOSIS — L0291 Cutaneous abscess, unspecified: Secondary | ICD-10-CM

## 2012-11-02 DIAGNOSIS — I739 Peripheral vascular disease, unspecified: Secondary | ICD-10-CM

## 2012-11-02 DIAGNOSIS — E1129 Type 2 diabetes mellitus with other diabetic kidney complication: Secondary | ICD-10-CM

## 2012-11-02 DIAGNOSIS — F17200 Nicotine dependence, unspecified, uncomplicated: Secondary | ICD-10-CM

## 2012-11-02 DIAGNOSIS — Z950 Presence of cardiac pacemaker: Secondary | ICD-10-CM

## 2012-11-02 DIAGNOSIS — N189 Chronic kidney disease, unspecified: Secondary | ICD-10-CM

## 2012-11-02 DIAGNOSIS — I10 Essential (primary) hypertension: Secondary | ICD-10-CM

## 2012-11-02 LAB — BASIC METABOLIC PANEL
BUN: 23 mg/dL (ref 6–23)
Creatinine, Ser: 1.89 mg/dL — ABNORMAL HIGH (ref 0.50–1.10)
GFR calc Af Amer: 32 mL/min — ABNORMAL LOW (ref >90)
GFR calc non Af Amer: 28 mL/min — ABNORMAL LOW (ref >90)
Glucose, Bld: 109 mg/dL — ABNORMAL HIGH (ref 70–99)
Potassium: 4.7 mEq/L (ref 3.5–5.1)

## 2012-11-02 LAB — GLUCOSE, CAPILLARY
Glucose-Capillary: 93 mg/dL (ref 70–99)
Glucose-Capillary: 127 mg/dL — ABNORMAL HIGH (ref 70–99)
Glucose-Capillary: 116 mg/dL — ABNORMAL HIGH (ref 70–99)

## 2012-11-02 LAB — CBC
HCT: 22.7 % — ABNORMAL LOW (ref 36.0–46.0)
Hemoglobin: 7.5 g/dL — ABNORMAL LOW (ref 12.0–15.0)
MCHC: 33 g/dL (ref 30.0–36.0)
MCV: 89.4 fL (ref 78.0–100.0)
RDW: 14.2 % (ref 11.5–15.5)

## 2012-11-02 LAB — MAGNESIUM: Magnesium: 1.9 mg/dL (ref 1.5–2.5)

## 2012-11-02 MED ORDER — SODIUM CHLORIDE 0.9 % IV SOLN: INTRAVENOUS | Status: DC

## 2012-11-02 MED ORDER — SODIUM CHLORIDE 0.9 % IV SOLN
INTRAVENOUS | Status: DC
  Administered 2012-11-02 – 2012-11-03 (×4): via INTRAVENOUS
  Administered 2012-11-03: 1000 mL via INTRAVENOUS

## 2012-11-02 NOTE — Progress Notes (Signed)
    [facility]  Internal Medicine Teaching Service Attending Note Date: 11/02/2012  Patient name: AELITA SELWYN  Medical record number: [id number]  Date of birth: 1951/01/18    This patient has been seen and discussed with the house staff. Please see their note for complete details. I concur with their findings with the following additions/corrections:  Patients cellulitis seems improved. We are waiting vein mapping results from the vascular surgery and possible surgical recommendations. I doubt her foot will be viable and vascular surgery seems skeptical that even with revascularization that it will improve.   He has had worsening thrombocytopenia. We are stopping her vancomycin as well as her heparin in setting a hit panel.  My suspicion for heparin-induced cytopenia is lower but it is a possibility. We may need to consider anticoagulation with fondaparinux. Will discuss further with pharmacy.  Alcide Evener 11/02/2012, 12:35 PM

## 2012-11-02 NOTE — Progress Notes (Signed)
VASCULAR LAB PRELIMINARY  PRELIMINARY  PRELIMINARY  PRELIMINARY  Right Lower Extremity Vein Map    Right Great Saphenous Vein   Segment Diameter Comment  1. Origin mm Patent graft  2. High Thigh mm Patent graft  3. Mid Thigh mm Not visualized  4. Low Thigh mm Not visualized  5. At Knee mm Patent compressible  6. High Calf 1.5 mm  Patent compressible, branch  7. Low Calf 2.5 mm Patent compressible  8. Ankle 2.3 mm Patent compressible   mm    mm    mm     Right Small Saphenous Vein  Segment Diameter Comment  1. Origin 3.4 mm Thrombosis  2. Distal pop fossa 3.4 mm Thrombosis  3. Proximal Calf 1.6 mm Patent compressible   mm    mm    mm    mm    Left Lower Extremity Vein Map    Left Great Saphenous Vein   Segment Diameter Comment  1. Origin mm Not visualized  2. High Thigh mm Not visualized  3. Mid Thigh mm Not visualized  4. Low Thigh mm Not visualized  5. At Knee mm Not visualized  6. High Calf mm Not visualized  7. Mid Calf 2.2 mm Tortuous, patent compressible  8. Ankle 1.1 mm Patent compressible   mm    mm    mm     Left Small Saphenous Vein  Segment Diameter Comment  1. Origin 4.5 mm Patent compressible  2. High Calf 3 mm Patent compressible  3. Mid Calf 2.4 mm Patent compressible, branch  4. Ankle 2.9 mm Patent compressible, branch   mm    mm    mm     Victorine Mcnee, RVT 11/02/2012, 4:35 PM

## 2012-11-02 NOTE — Progress Notes (Signed)
VASCULAR PROGRESS NOTE  SUBJECTIVE: No specific complaints.  PHYSICAL EXAM: Filed Vitals:   11/01/12 1445 11/01/12 2140 11/02/12 0512 11/02/12 1410  BP: 184/65 142/53 159/73 168/82  Pulse: 64 64 59 63  Temp: 97.5 F (36.4 C) 98.4 F (36.9 C) 98.9 F (37.2 C) 98 F (36.7 C)  TempSrc: Axillary Oral Oral Oral  Resp: 18 19 18 18   Height:      Weight:   174 lb 11.2 oz (79.243 kg)   SpO2: 94% 96% 95% 100%   No change to wound on the lateral aspect of left foot. Palpable femoral pulses bilaterally. She continues to have significant left lower extremity swelling. The left calf is fairly tight.  LABS: Lab Results  Component Value Date   WBC 8.7 11/02/2012   HGB 7.5* 11/02/2012   HCT 22.7* 11/02/2012   MCV 89.4 11/02/2012   PLT 91* 11/02/2012   Lab Results  Component Value Date   CREATININE 1.89* 11/02/2012   Lab Results  Component Value Date   INR 1.07 10/30/2012   CBG (last 3)   Basename 11/02/12 1718 11/02/12 1212 11/02/12 0743  GLUCAP 127* 93 107*   I have reviewed her bilateral lower extremity vein mapping. The majority of her greater saphenous vein has been taken from both legs for her previous bypass grafts. Unfortunately, she does not appear to have adequate remaining vein in the greater saphenous system or lesser saphenous vein for an adequate bypass conduit   ASSESSMENT AND PLAN: 1. This patient has significant left lower extremity swelling. She has recently undergone hip surgery in [place]. I will obtain a venous duplex scan to rule out DVT of the left lower extremity. 2. With respect to her nonhealing wound on the left foot, she has a functioning left femoropopliteal bypass graft into a blind popliteal segment. She does reconstitute her anterior tibial artery in the left leg, however, she is a very poor candidate for further revascularization given that she does not have adequate vein for additional bypass. In addition she has marked swelling in the left leg and would be  unlikely to heal any wounds in the left leg. I do not think that a prosthetic bypass originating from her femoropopliteal graft and then tunneled through the interosseous membrane to the anterior tibial artery would have any chance for long-term success. 3. Assuming that her venous duplex scan is negative, she could be discharged from my standpoint and I can follow the wound as an outpatient. I have explained to her that I think this is clearly a limb threatening problem and that she ultimately could require amputation.  Gae Gallop [id number] 11/02/2012

## 2012-11-02 NOTE — Progress Notes (Signed)
Subjective: [name] [name] was seen and examined at bedside.  She is s/p lower extremity angiogram yesterday.  She wishes to go home as soon as possible.  She has no overnight complaints and currently denies any fever, chills, N/V/D, chest pain, shortness of breath, or any urinary complaints at this time.    Objective: Vital signs in last 24 hours: Filed Vitals:   11/01/12 1445 11/01/12 2140 11/02/12 0512 11/02/12 1410  BP: 184/65 142/53 159/73 168/82  Pulse: 64 64 59 63  Temp: 97.5 F (36.4 C) 98.4 F (36.9 C) 98.9 F (37.2 C) 98 F (36.7 C)  TempSrc: Axillary Oral Oral Oral  Resp: 18 19 18 18   Height:      Weight:   174 lb 11.2 oz (79.243 kg)   SpO2: 94% 96% 95% 100%   Weight change: -4 lb 12.8 oz (-2.177 kg)  Intake/Output Summary (Last 24 hours) at 11/02/12 1514 Last data filed at 11/02/12 1300  Gross per 24 hour  Intake    680 ml  Output    600 ml  Net     80 ml   Vitals reviewed.  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 Dorsum of foot with 2 flaccid bullae. Plantar surface of 5th metatarsal with large ulcer surrounded by fluctuant, purulent fluid-filled pocket non draining and appearing necrotic. No tenderness to palpation. No bone visualized. Improved erythema, warmth and pitting edema extending from foot to below her L knee (marked w pen), firm. R foot with large callous over lateral plantar surface but no erythema/induration/edema/fluctuance/drainage. No pedal pulses appreciated, +2 femoral pulses b/l.  Neuro: alert and oriented X3, cranial nerves II-XII grossly intact, strength and sensation to light touch equal in bilateral upper and lower extremities  Lab Results: Basic Metabolic Panel:  Lab [id number] 0700 11/01/12 1303  11/01/12 0550 10/31/12 0650  NA 137 -- 137 --  K 4.7 -- 4.4 --  CL 109 -- 109 --  CO2 18* -- 19 --  GLUCOSE 109* -- 97 --  BUN 23 -- 22 --  CREATININE 1.89* 1.79* -- --  CALCIUM 9.0 -- 8.4 --  MG 1.9 -- -- 1.9  PHOS -- -- -- 4.6   Liver Function Tests:  Lab 10/29/12 2107  AST 12  ALT 7  ALKPHOS 139*  BILITOT 0.5  PROT 6.4  ALBUMIN 2.3*   CBC:  Lab 11/02/12 0700 11/01/12 1303 10/29/12 1258  WBC 8.7 7.2 --  NEUTROABS -- -- 7.2  HGB 7.5* 7.4* --  HCT 22.7* 23.1* --  MCV 89.4 90.6 --  PLT 91* 103* --   CBG:  Lab 11/02/12 1212 11/02/12 0743 11/01/12 2134 11/01/12 1739 11/01/12 1238 11/01/12 1146  GLUCAP 93 107* 100* 137* 91 105*   Coagulation:  Lab 10/30/12 1037  LABPROT 13.8  INR 1.07   Anemia Panel:  Lab 10/29/12 2107  VITAMINB12 568  FOLATE 5.4  FERRITIN 525*  TIBC 164*  IRON 57  RETICCTPCT 1.6   Urine Drug Screen: Drugs of Abuse     Component Value Date/Time   LABOPIA NEGATIVE 10/30/2009 0458   COCAINSCRNUR NEGATIVE 10/30/2009 0458   LABBENZ NEGATIVE 10/30/2009 0458   AMPHETMU NEGATIVE 10/30/2009 0458    Urinalysis:  Lab 10/30/12 1442  COLORURINE YELLOW  LABSPEC 1.017  PHURINE 5.5  GLUCOSEU NEGATIVE  HGBUR NEGATIVE  BILIRUBINUR NEGATIVE  KETONESUR NEGATIVE  PROTEINUR 30*  UROBILINOGEN 0.2  NITRITE NEGATIVE  LEUKOCYTESUR NEGATIVE   Micro Results: Recent Results (from the past 240 hour(s))  CULTURE, BLOOD (ROUTINE X 2)     Status: Normal (Preliminary result)   Collection Time   10/29/12  6:40 PM      Component Value Range Status Comment   Specimen Description BLOOD ARM LEFT   Final    Special Requests BOTTLES DRAWN AEROBIC AND ANAEROBIC 10CC   Final    Culture  Setup Time 10/30/2012 01:20   Final    Culture     Final    Value:        BLOOD CULTURE RECEIVED NO GROWTH TO DATE CULTURE WILL BE HELD FOR 5 DAYS BEFORE ISSUING A FINAL NEGATIVE REPORT   Report Status PENDING   Incomplete   CULTURE, BLOOD (ROUTINE X 2)     Status: Normal  (Preliminary result)   Collection Time   10/29/12  6:46 PM      Component Value Range Status Comment   Specimen Description BLOOD HAND RIGHT   Final    Special Requests     Final    Value: BOTTLES DRAWN AEROBIC AND ANAEROBIC BLUE 10CC RED 5CC   Culture  Setup Time 10/30/2012 01:20   Final    Culture     Final    Value:        BLOOD CULTURE RECEIVED NO GROWTH TO DATE CULTURE WILL BE HELD FOR 5 DAYS BEFORE ISSUING A FINAL NEGATIVE REPORT   Report Status PENDING   Incomplete   MRSA PCR SCREENING     Status: Normal   Collection Time   10/29/12  8:32 PM      Component Value Range Status Comment   MRSA by PCR NEGATIVE  NEGATIVE Final   SURGICAL PCR SCREEN     Status: Normal   Collection Time   11/01/12 10:50 AM      Component Value Range Status Comment   MRSA, PCR NEGATIVE  NEGATIVE Final    Staphylococcus aureus NEGATIVE  NEGATIVE Final    Studies/Results: No results found. Medications: I have reviewed the patient's current medications. Scheduled Meds:    . carvedilol  6.25 mg Oral BID WC  . gabapentin  300 mg Oral TID  . insulin aspart  0-9 Units Subcutaneous TID WC  . isosorbide mononitrate  60 mg Oral Daily  . levofloxacin (LEVAQUIN) IV  750 mg Intravenous Q48H  . metroNIDAZOLE  500 mg Oral Q12H  . prednisoLONE acetate  1 drop Left Eye QID  . sodium chloride  3 mL Intravenous Q12H  . Travoprost (BAK Free)  1 drop Both Eyes QHS  . Vitamin D (Ergocalciferol)  50,000 Units Oral Q7 days   Continuous Infusions:    . sodium chloride 125 mL/hr at 11/02/12 0924   PRN Meds:.sodium chloride, acetaminophen, albuterol, hydrALAZINE, ondansetron (ZOFRAN) IV, ondansetron, oxyCODONE-acetaminophen, sodium chloride Assessment/Plan:  [name] [name] is a 62 y.o. AA female with PMH of HTN, PVD s/p bilatearl fem-pop bypasses, CKD, hyperlipidemia, and DM presenting from foot care center for concern for left foot and lower leg infection.   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  Nontoxic appearing on exam, afebrile, no leukocytosis, making sepsis less likely.  Foot x-ray did not show changes with OM with no acute fx or subluxation and no bone was visualized on exam.  CT left foot: plantar forefoot soft tissue ulceration, no abscess or OM evidence. Prelim ABI: R 1.02 and L 0.41 with patent R and L fem-pop bypass grafts.  Received 1 g IV vancomycin in the ED.  No blood cultures drawn prior to antibiotic administration. Ortho evaluated day of admission and recommended vascular consultation.  - d/c vancomycin and cefepime.  Continue flagyl day 4  - f/u blood cultures x2--NGTD, but expect low yield in setting of IV vancomycin upon arrival prior to cultures  - Vascular following, Dr. Scot Dock, appreciate their input--limb threatening situation, change of limb salvage with arteriography and evaluate for revascularization.  S/p CO2 arteriogram 2/3.  Pending vein mapping.       2) HTN  - Continue home Imdur and carvedilol.   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 Off of insulin since 01/2012. Blood sugar here 89.  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  - Continue home Coreg   5) ?Acute on Chronic CKD   Lab 11/02/12 0700 11/01/12 1303 11/01/12 0550 10/31/12 0650 10/30/12 1037  CREATININE 1.89* 1.79* 1.95* 2.00* 1.96*   Creatinine on admission 2.1, baseline appears ~1.6 . Trending down. May represent reaction to linezolid which was recently started in outpatient setting. Eating and drinking well with no evidence of dehydration on exam. No evidence of postrenal obstruction as patient reports urinating normally with no change in color or character of her urine.  UA neg for bacteria, UNA 62, UCr 109.77--FeNA 0.07% suggestive of pre-renal -  continue IVF  6) Normocytic anemia   Lab 11/02/12 0700 11/01/12 1303 11/01/12 0550  HGB 7.5* 7.4* 7.1*  HCT 22.7* 23.1* 22.1*  WBC 8.7 7.2 7.2  PLT 91* 103* 101*   Hb on admission 8.1 with MCV 90. Prior Hb was 9.0 three weeks ago but 13.0 in November.  May represent anemia of chronic disease, however patient does have history of GI bleeding and ischemic colitis.  No recent diarrhea or blood in her stools. FOBT negative.  No symptomatic anemia or jaundice on exam.  Iron panel: Iron 57, TIBC 164, UIBC 107, b12 568 and folate 5.4.   -Hb up to 7.5 today no active signs of bleeding -Continue to trend CBC  7) Thrombocytopenia  Lab 11/02/12 0700 11/01/12 1303 11/01/12 0550  HGB 7.5* 7.4* 7.1*  HCT 22.7* 23.1* 22.1*  WBC 8.7 7.2 7.2  PLT 91* 103* 101*   -Platelets trending down from admission (158).  Possibly secondary to recent antibiotic use (on cefepime and vancomycin and was on Linezolid) vs chronic illness,  ?HIT.  Platelets were already low on admission and on lovenox prior to arrival.   -d/c Cefepime and vancomycin - d/c lovenox -HIT panel--may consider anticoagulation with fondaparinux if suspicion for HIT high -Trend platelets   Diet: Carb modified   DVT Ppx: SCDs  Dispo: Disposition is deferred at this time, awaiting improvement of current medical problems.   The patient does have a current PCP (SHARDA, NEEMA, MD), therefore will be requiring OPC follow-up after discharge.  The patient does not have transportation limitations that hinder transportation to clinic appointments.   Services Needed at time of discharge: Y = Yes, Blank = No PT:   OT:   RN:  Equipment:   Other:     LOS: 4 days   Jerene Pitch 11/02/2012, 3:14 PM

## 2012-11-03 DIAGNOSIS — M7989 Other specified soft tissue disorders: Secondary | ICD-10-CM

## 2012-11-03 LAB — DIFFERENTIAL
Basophils Absolute: 0 10*3/uL (ref 0.0–0.1)
Basophils Relative: 1 % (ref 0–1)
Monocytes Absolute: 0.7 10*3/uL (ref 0.1–1.0)
Neutro Abs: 4.6 10*3/uL (ref 1.7–7.7)

## 2012-11-03 LAB — GLUCOSE, CAPILLARY: Glucose-Capillary: 99 mg/dL (ref 70–99)

## 2012-11-03 LAB — DIC (DISSEMINATED INTRAVASCULAR COAGULATION)PANEL
D-Dimer, Quant: 3.91 ug/mL-FEU — ABNORMAL HIGH (ref 0.00–0.48)
INR: 1.16 (ref 0.00–1.49)
Platelets: 79 10*3/uL — ABNORMAL LOW (ref 150–400)

## 2012-11-03 LAB — BASIC METABOLIC PANEL
CO2: 18 mEq/L — ABNORMAL LOW (ref 19–32)
Calcium: 8.8 mg/dL (ref 8.4–10.5)
Chloride: 111 mEq/L (ref 96–112)
Potassium: 4.8 mEq/L (ref 3.5–5.1)
Sodium: 138 mEq/L (ref 135–145)

## 2012-11-03 LAB — CBC
Platelets: 68 10*3/uL — ABNORMAL LOW (ref 150–400)
RBC: 2.37 MIL/uL — ABNORMAL LOW (ref 3.87–5.11)
WBC: 6.5 10*3/uL (ref 4.0–10.5)

## 2012-11-03 MED ORDER — LEVOFLOXACIN 750 MG PO TABS: 750.0000 mg | ORAL_TABLET | ORAL | Status: DC

## 2012-11-03 NOTE — Progress Notes (Signed)
*  PRELIMINARY RESULTS* Vascular Ultrasound Lower extremity venous duplex has been completed.  Preliminary findings: Bilaterally no obvious evidence of DVT in visualized veins.  Landry Mellow, RDMS, RVT 11/03/2012, 10:45 AM

## 2012-11-03 NOTE — Progress Notes (Signed)
Visit to patient while in hospital. Will call after she is discharged to assist with transition of care. 

## 2012-11-03 NOTE — Progress Notes (Signed)
BLE venous duplex reveals no obvious evidence of DVT in visualized veins.  She may be discharged from a vascular standpoint.  Treyon Wymore 11/03/2012 11:04 AM

## 2012-11-03 NOTE — Progress Notes (Signed)
Pts BP 163/61. MD notified, no new orders given. Will continue to monitor.

## 2012-11-03 NOTE — Progress Notes (Addendum)
I have called and discussed patient's thrombocytopenia with hematologist Dr. Curlene Dolphin over the phone. Dr. Curlene Dolphin agreed that Vancomycin and Heparin should be stopped ( Heparin and Vancomycin stopped on 11/01/12). Dr. Curlene Dolphin agreed with checking DIC panel, HIT panel, pathology smear and B/L LE doppler. She does not recommend to start the alternative anticoagulants for now. She recommended that we should wait for DIC panel and B/L LE doppler and call her back if needed.   Charlann Lange, MD PGY-2 Internal medicine 11/03/12 [phone] am  I called and discussed DIC panel and Doppler results with Dr. Curlene Dolphin over the phone.  The DIC panel shows PT/INR 14.6/1.16, PTT 45, Fibrinogen 525, D-Dimer 3.91, Platelets 79, no schistocytes. LE Doppler is negative for DVT.  Dr. Curlene Dolphin recommended to only start alternative anticoagulant if thrombosis is confirmed. Otherwise, continue to monitor for s/s bleeding and thrombosis, trend Platelets and follow up HIT panel.   _ likely consult Hematology in am to coordinate with vascular and orthopedic.   Charlann Lange, MD PGY-2 Internal Medicine 11/03/12 1:23 PM

## 2012-11-03 NOTE — Progress Notes (Signed)
Subjective: [name] [name] was seen and examined at bedside.  She is s/p lower extremity angiogram 2/3 and vein mapping yesterday.  Vascular does not feel a prosthetic bypass would any long term success.  She wishes to go home as soon as possible.  She has no overnight complaints and currently denies any fever, chills, N/V/D, chest pain, shortness of breath, or any urinary complaints at this time.    Objective: Vital signs in last 24 hours: Filed Vitals:   11/02/12 1410 11/02/12 2115 11/03/12 0559 11/03/12 0637  BP: 168/82 156/74 163/61 161/65  Pulse: 63 61 65   Temp: 98 F (36.7 C) 98.1 F (36.7 C) 98.2 F (36.8 C)   TempSrc: Oral Oral Oral   Resp: 18 17 18    Height:      Weight:   185 lb 11.2 oz (84.233 kg)   SpO2: 100% 100% 98%    Weight change: 11 lb (4.99 kg)  Intake/Output Summary (Last 24 hours) at 11/03/12 [id number] Last data filed at 11/03/12 0600  Gross per 24 hour  Intake   2795 ml  Output      0 ml  Net   2795 ml   Vitals reviewed.  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 Dorsum of foot with 2 flaccid bullae. Plantar surface of 5th metatarsal with large ulcer surrounded by fluctuant, purulent fluid-filled pocket non draining and appearing necrotic. No tenderness to palpation. No bone visualized. Improved erythema, warmth and pitting edema extending from foot to below her L knee (marked w pen), firm. R foot with large callous over lateral plantar surface but no erythema/induration/edema/fluctuance/drainage. No pedal pulses appreciated, +2 femoral pulses b/l.  Neuro: alert and oriented X3, cranial nerves II-XII grossly intact, strength and sensation to light touch equal in bilateral upper and lower extremities  Lab Results: Basic  Metabolic Panel:  Lab [id number] 0500 11/02/12 0700 10/31/12 0650  NA 138 137 --  K 4.8 4.7 --  CL 111 109 --  CO2 18* 18* --  GLUCOSE 96 109* --  BUN 23 23 --  CREATININE 1.83* 1.89* --  CALCIUM 8.8 9.0 --  MG -- 1.9 1.9  PHOS -- -- 4.6   Liver Function Tests:  Lab 10/29/12 2107  AST 12  ALT 7  ALKPHOS 139*  BILITOT 0.5  PROT 6.4  ALBUMIN 2.3*   CBC:  Lab 11/03/12 0500 11/02/12 0700 10/29/12 1258  WBC 6.5 8.7 --  NEUTROABS -- -- 7.2  HGB 7.0* 7.5* --  HCT 21.1* 22.7* --  MCV 89.0 89.4 --  PLT 68* 91* --   CBG:  Lab 11/03/12 0753 11/02/12 2115 11/02/12 1718 11/02/12 1212 11/02/12 0743 11/01/12 2134  GLUCAP 99 116* 127* 93 107* 100*   Coagulation:  Lab 10/30/12 1037  LABPROT 13.8  INR 1.07   Anemia Panel:  Lab 10/29/12 2107  VITAMINB12 568  FOLATE 5.4  FERRITIN 525*  TIBC 164*  IRON 57  RETICCTPCT 1.6   Urine Drug Screen: Drugs of Abuse     Component Value Date/Time   LABOPIA NEGATIVE 10/30/2009 0458   COCAINSCRNUR NEGATIVE 10/30/2009 0458   LABBENZ NEGATIVE 10/30/2009 0458   AMPHETMU NEGATIVE 10/30/2009 0458    Urinalysis:  Lab 10/30/12 1442  COLORURINE YELLOW  LABSPEC 1.017  PHURINE 5.5  GLUCOSEU NEGATIVE  HGBUR NEGATIVE  BILIRUBINUR NEGATIVE  KETONESUR NEGATIVE  PROTEINUR 30*  UROBILINOGEN 0.2  NITRITE NEGATIVE  LEUKOCYTESUR NEGATIVE   Micro Results: Recent Results (from the past 240 hour(s))  CULTURE, BLOOD (ROUTINE X 2)     Status: Normal (Preliminary result)   Collection Time   10/29/12  6:40 PM      Component Value Range Status Comment   Specimen Description BLOOD ARM LEFT   Final    Special Requests BOTTLES DRAWN AEROBIC AND ANAEROBIC 10CC   Final    Culture  Setup Time 10/30/2012 01:20   Final    Culture     Final    Value:        BLOOD CULTURE RECEIVED NO GROWTH TO DATE CULTURE WILL BE HELD FOR 5 DAYS BEFORE ISSUING A FINAL NEGATIVE REPORT   Report Status PENDING   Incomplete   CULTURE, BLOOD (ROUTINE X 2)     Status: Normal  (Preliminary result)   Collection Time   10/29/12  6:46 PM      Component Value Range Status Comment   Specimen Description BLOOD HAND RIGHT   Final    Special Requests     Final    Value: BOTTLES DRAWN AEROBIC AND ANAEROBIC BLUE 10CC RED 5CC   Culture  Setup Time 10/30/2012 01:20   Final    Culture     Final    Value:        BLOOD CULTURE RECEIVED NO GROWTH TO DATE CULTURE WILL BE HELD FOR 5 DAYS BEFORE ISSUING A FINAL NEGATIVE REPORT   Report Status PENDING   Incomplete   MRSA PCR SCREENING     Status: Normal   Collection Time   10/29/12  8:32 PM      Component Value Range Status Comment   MRSA by PCR NEGATIVE  NEGATIVE Final   SURGICAL PCR SCREEN     Status: Normal   Collection Time   11/01/12 10:50 AM      Component Value Range Status Comment   MRSA, PCR NEGATIVE  NEGATIVE Final    Staphylococcus aureus NEGATIVE  NEGATIVE Final    Studies/Results: No results found. Medications: I have reviewed the patient's current medications. Scheduled Meds:    . carvedilol  6.25 mg Oral BID WC  . gabapentin  300 mg Oral TID  . insulin aspart  0-9 Units Subcutaneous TID WC  . isosorbide mononitrate  60 mg Oral Daily  . levofloxacin (LEVAQUIN) IV  750 mg Intravenous Q48H  . metroNIDAZOLE  500 mg Oral Q12H  . prednisoLONE acetate  1 drop Left Eye QID  . sodium chloride  3 mL Intravenous Q12H  . Travoprost (BAK Free)  1 drop Both Eyes QHS  . Vitamin D (Ergocalciferol)  50,000 Units Oral Q7 days   Continuous Infusions:    . sodium chloride 1,000 mL (11/03/12 0839)   PRN Meds:.sodium chloride, acetaminophen, albuterol, hydrALAZINE, ondansetron (ZOFRAN) IV, ondansetron, oxyCODONE-acetaminophen, sodium chloride Assessment/Plan:  [name] is a 62 y.o. AA female with PMH of HTN, PVD s/p bilatearl fem-pop bypasses, CKD, hyperlipidemia, and DM presenting from foot care center for concern for left foot and lower leg infection.   1) Thrombocytopenia  Lab 11/03/12 0500 11/02/12 0700  11/01/12 1303  HGB 7.0* 7.5* 7.4*  HCT 21.1* 22.7* 23.1*  WBC 6.5 8.7 7.2  PLT 68* 91* 103*   -Platelets trending down from admission (158).  Possibly secondary to  recent antibiotic use (on cefepime and vancomycin and was on Linezolid) vs chronic illness,  ?HIT.  Platelets were already low on admission and on lovenox prior to arrival.   - Cefepime and vancomycin have been discontinued - d/c lovenox 2/4 - b/l venous doppler prelim: negative for dvt - HIT panel pending--may consider anticoagulation with fondaparinux? if suspicion for HIT high - DIC panel: PT: 14.6, INR: 1.16, PTT: 45, Fibrinogen 525, D-Dimer 3.91, Platelets 79, no schistocytes on smear - appreciate pharmacy input--if HIT suspected, may give fondaparinux 2.5 [place] q24hrs (renal dosed), if dvt present, could use argatroban - Trend platelets  - discussed with hematology--Dr. 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  Nontoxic appearing on exam, afebrile, no leukocytosis, making sepsis less likely.  Foot x-ray did not show changes with OM with no acute fx or subluxation and no bone was visualized on exam.  CT left foot: plantar forefoot soft tissue ulceration, no abscess or OM evidence. Prelim ABI: R 1.02 and L 0.41 with patent R and L fem-pop bypass grafts.  Received 1 g IV vancomycin in the ED.  No blood cultures drawn prior to antibiotic administration. Ortho evaluated day of admission and recommended vascular consultation.  - d/c vancomycin and cefepime.  Continue flagyl day 5 and levaquin q48 hours day 2  - f/u blood cultures x2--NGTD, but expect low yield in setting of IV vancomycin upon arrival prior to cultures  - Vascular following, Dr. Scot Dock, appreciate their input--limb threatening situation, S/p CO2 arteriogram 2/3 and vein mapping 2/4.  Not good  candidate for prosthetic bypass.   Can be d/c from vascular point and follow up outpatient' -b/l venous duplex prelim neg for DVT     3) HTN  - Continue home Imdur and carvedilol.   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 Off of insulin since 01/2012. Blood sugar here 89.  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  - Continue home Coreg   6) ?Acute on Chronic CKD   Lab 11/03/12 0500 11/02/12 0700 11/01/12 1303 11/01/12 0550 10/31/12 0650  CREATININE 1.83* 1.89* 1.79* 1.95* 2.00*   Creatinine on admission 2.1, baseline appears ~1.6 . Trending down. May represent reaction to linezolid which was recently started in outpatient setting. Eating and drinking well with no evidence of dehydration on exam. No evidence of postrenal obstruction as patient reports urinating normally with no change in color or character of her urine.  UA neg for bacteria, UNA 62, UCr 109.77--FeNA 0.07% suggestive of pre-renal - continue IVF  7) Normocytic anemia   Lab 11/03/12 0500 11/02/12 0700 11/01/12 1303  HGB 7.0* 7.5* 7.4*  HCT 21.1* 22.7* 23.1*  WBC 6.5 8.7 7.2  PLT 68* 91* 103*   Hb on admission 8.1 with MCV 90. Prior Hb was 9.0 three weeks ago but 13.0 in November.  May represent anemia of chronic disease, however patient does have history of GI bleeding and ischemic colitis.  No recent diarrhea or blood in her stools. FOBT negative.  No symptomatic anemia or jaundice on exam.  Iron panel: Iron 57, TIBC 164, UIBC 107, b12 568 and folate 5.4.   -no active signs of bleeding -Continue to trend CBC  Diet: Carb modified   DVT Ppx: SCDs  Dispo: Disposition is deferred at this time, awaiting improvement of current medical problems.   The patient does have a current PCP (SHARDA, NEEMA, MD), therefore will be requiring OPC follow-up after discharge.  The patient does not have  transportation limitations that hinder transportation to clinic appointments.   Services Needed at time of discharge: Y = Yes, Blank = No PT:   OT:   RN:   Equipment:   Other:     LOS: 5 days   Jerene Pitch 11/03/2012, 9:23 AM

## 2012-11-03 NOTE — Progress Notes (Signed)
ANTIBIOTIC CONSULT NOTE - FOLLOW UP  Pharmacy Consult for Levaquin Indication: LLE cellulitis/foot ulcer  Allergies  Allergen Reactions  . Codeine Itching and Swelling  . Hydrocodone Itching and Swelling  . Penicillins Rash    Patient Measurements: Height: 5\' 2"  (157.5 cm) Weight: 185 lb 11.2 oz (84.233 kg) IBW/kg (Calculated) : 50.1   Vital Signs: Temp: 98.2 F (36.8 C) (02/05 0559) Temp src: Oral (02/05 0559) BP: 161/65 mmHg (02/05 0637) Pulse Rate: 65  (02/05 0559) Intake/Output from previous day: 02/04 0701 - 02/05 0700 In: 3055 [P.O.:480; I.V.:2575] Out: 600 [Urine:600] Intake/Output from this shift:    Labs:  Basename 11/03/12 0911 11/03/12 0500 11/02/12 0700 11/01/12 1303  WBC -- 6.5 8.7 7.2  HGB -- 7.0* 7.5* 7.4*  PLT 79* 68* 91* --  LABCREA -- -- -- --  CREATININE -- 1.83* 1.89* 1.79*   Estimated Creatinine Clearance: 33.3 ml/min (by C-G formula based on Cr of 1.83). No results found for this basename: VANCOTROUGH:2,VANCOPEAK:2,VANCORANDOM:2,GENTTROUGH:2,GENTPEAK:2,GENTRANDOM:2,TOBRATROUGH:2,TOBRAPEAK:2,TOBRARND:2,AMIKACINPEAK:2,AMIKACINTROU:2,AMIKACIN:2, in the last 72 hours   Microbiology: Recent Results (from the past 720 hour(s))  CULTURE, BLOOD (ROUTINE X 2)     Status: Normal (Preliminary result)   Collection Time   10/29/12  6:40 PM      Component Value Range Status Comment   Specimen Description BLOOD ARM LEFT   Final    Special Requests BOTTLES DRAWN AEROBIC AND ANAEROBIC 10CC   Final    Culture  Setup Time 10/30/2012 01:20   Final    Culture     Final    Value:        BLOOD CULTURE RECEIVED NO GROWTH TO DATE CULTURE WILL BE HELD FOR 5 DAYS BEFORE ISSUING A FINAL NEGATIVE REPORT   Report Status PENDING   Incomplete   CULTURE, BLOOD (ROUTINE X 2)     Status: Normal (Preliminary result)   Collection Time   10/29/12  6:46 PM      Component Value Range Status Comment   Specimen Description BLOOD HAND RIGHT   Final    Special Requests     Final     Value: BOTTLES DRAWN AEROBIC AND ANAEROBIC BLUE 10CC RED 5CC   Culture  Setup Time 10/30/2012 01:20   Final    Culture     Final    Value:        BLOOD CULTURE RECEIVED NO GROWTH TO DATE CULTURE WILL BE HELD FOR 5 DAYS BEFORE ISSUING A FINAL NEGATIVE REPORT   Report Status PENDING   Incomplete   MRSA PCR SCREENING     Status: Normal   Collection Time   10/29/12  8:32 PM      Component Value Range Status Comment   MRSA by PCR NEGATIVE  NEGATIVE Final   SURGICAL PCR SCREEN     Status: Normal   Collection Time   11/01/12 10:50 AM      Component Value Range Status Comment   MRSA, PCR NEGATIVE  NEGATIVE Final    Staphylococcus aureus NEGATIVE  NEGATIVE Final     Anti-infectives     Start     Dose/Rate Route Frequency Ordered Stop   11/01/12 0900   levofloxacin (LEVAQUIN) IVPB 750 mg        750 mg 100 mL/hr over 90 Minutes Intravenous Every 48 hours 11/01/12 0850     10/30/12 1600   vancomycin (VANCOCIN) 750 mg in sodium chloride 0.9 % 150 mL IVPB  Status:  Discontinued        750  mg 150 mL/hr over 60 Minutes Intravenous Every 24 hours 10/29/12 1820 11/02/12 0914   10/30/12 1000   metroNIDAZOLE (FLAGYL) tablet 500 mg        500 mg Oral Every 12 hours 10/30/12 0754     10/29/12 1900   ceFEPIme (MAXIPIME) 2 g in dextrose 5 % 50 mL IVPB  Status:  Discontinued        2 g 100 mL/hr over 30 Minutes Intravenous Every 24 hours 10/29/12 1820 11/01/12 0838   10/29/12 1315   vancomycin (VANCOCIN) IVPB 1000 mg/200 mL premix        1,000 mg 200 mL/hr over 60 Minutes Intravenous  Once 10/29/12 1300 10/29/12 1827          Assessment: 62 y.o. F who continues on Levaquin for LLE cellulitis/foot ulcer in the setting of hx DM. Today is total abx D#6. SCr 1.83, CrCl~30-40 ml/min. Dose remains appropriate.   Cefepime 1/31 >>2/3 Vanc 1/31 >> 2/3 Levaquin 2/3 >>  1/31 BCx >> ngtd  Will change Levaquin from IV to po today based on a [facility] P&T IV to po protocol where the patient meets the  following criteria:   Patient being treated for a respiratory tract infection, urinary tract infection, or cellulitis  The patient is not neutropenic and does not exhibit a GI malabsorption state  The patient is eating (either orally or via tube) and/or has been taking other orally administered medications for a least 24 hours  The patient is improving clinically and has a Tmax < 100.5  Goal of Therapy:  Proper antibiotics for infection/cultures adjusted for renal/hepatic function   Plan:  1. Change Levaquin to 750 mg po every 48 hours 2. Will continue to follow renal function, culture results, LOT, and antibiotic de-escalation plans   Alycia Rossetti, PharmD, BCPS Clinical Pharmacist Pager: [phone] 11/03/2012 1:06 PM

## 2012-11-04 ENCOUNTER — Telehealth: Payer: Self-pay | Admitting: Vascular Surgery

## 2012-11-04 DIAGNOSIS — I2510 Atherosclerotic heart disease of native coronary artery without angina pectoris: Secondary | ICD-10-CM

## 2012-11-04 DIAGNOSIS — D696 Thrombocytopenia, unspecified: Secondary | ICD-10-CM

## 2012-11-04 DIAGNOSIS — I129 Hypertensive chronic kidney disease with stage 1 through stage 4 chronic kidney disease, or unspecified chronic kidney disease: Secondary | ICD-10-CM | POA: Diagnosis present

## 2012-11-04 DIAGNOSIS — N183 Chronic kidney disease, stage 3 unspecified: Secondary | ICD-10-CM

## 2012-11-04 DIAGNOSIS — N184 Chronic kidney disease, stage 4 (severe): Secondary | ICD-10-CM | POA: Diagnosis present

## 2012-11-04 DIAGNOSIS — F22 Delusional disorders: Secondary | ICD-10-CM

## 2012-11-04 DIAGNOSIS — N179 Acute kidney failure, unspecified: Secondary | ICD-10-CM

## 2012-11-04 DIAGNOSIS — D649 Anemia, unspecified: Secondary | ICD-10-CM

## 2012-11-04 DIAGNOSIS — S72009A Fracture of unspecified part of neck of unspecified femur, initial encounter for closed fracture: Secondary | ICD-10-CM | POA: Diagnosis present

## 2012-11-04 LAB — BASIC METABOLIC PANEL
CO2: 18 mEq/L — ABNORMAL LOW (ref 19–32)
Calcium: 9 mg/dL (ref 8.4–10.5)
Calcium: 9 mg/dL (ref 8.4–10.5)
Chloride: 112 mEq/L (ref 96–112)
Creatinine, Ser: 1.78 mg/dL — ABNORMAL HIGH (ref 0.50–1.10)
GFR calc Af Amer: 35 mL/min — ABNORMAL LOW (ref >90)
GFR calc Af Amer: 34 mL/min — ABNORMAL LOW (ref >90)
Sodium: 140 mEq/L (ref 135–145)

## 2012-11-04 LAB — MAGNESIUM: Magnesium: 1.8 mg/dL (ref 1.5–2.5)

## 2012-11-04 LAB — CBC
MCH: 29.9 pg (ref 26.0–34.0)
MCHC: 33.5 g/dL (ref 30.0–36.0)
MCV: 89.2 fL (ref 78.0–100.0)
Platelets: 84 10*3/uL — ABNORMAL LOW (ref 150–400)
RDW: 14.6 % (ref 11.5–15.5)

## 2012-11-04 LAB — RETICULOCYTES
RBC.: 2.3 MIL/uL — ABNORMAL LOW (ref 3.87–5.11)
Retic Count, Absolute: 9.2 10*3/uL — ABNORMAL LOW (ref 19.0–186.0)

## 2012-11-04 LAB — GLUCOSE, CAPILLARY
Glucose-Capillary: 95 mg/dL (ref 70–99)
Glucose-Capillary: 108 mg/dL — ABNORMAL HIGH (ref 70–99)

## 2012-11-04 MED ORDER — METRONIDAZOLE 500 MG PO TABS: ORAL_TABLET | ORAL | Status: AC

## 2012-11-04 MED ORDER — LEVOFLOXACIN 750 MG PO TABS: 750.0000 mg | ORAL_TABLET | ORAL | Status: AC

## 2012-11-04 NOTE — Progress Notes (Signed)
Subjective: [name] was seen and examined at bedside.   She was noted to be hypertensive overnight and given IV hydralazine since then improved.  Currently she denies any fever, chills, N/V/D, chest pain, shortness of breath, or any urinary complaints at this time.    [name] was residing at [facility] facility prior to admission and she does not wish to return there.  She was apparently sent there for physical therapy s/p hip fracture and surgery in [facility] 08/2012.  She followed up with Dr. Onnie Graham from [facility].  I spoke with him in detail today and Dr. Onnie Graham informed me that usually with her type of hip fractures, they recommend 3 months of toe touch weightbearing and usually that requires assistance of crutches or walker.  She does not need to be in a SNF for such physical therapy if she is able to do it at home and limit her weight bearing as recommended.  This was all explained to [name] who is adamant about not wanting to return to [place] and wishes to go home.    Objective: Vital signs in last 24 hours: Filed Vitals:   11/03/12 0637 11/03/12 1500 11/03/12 2124 11/04/12 0537  BP: 161/65 134/50 156/62 169/81  Pulse:  60 65 64  Temp:  98.4 F (36.9 C) 98.3 F (36.8 C) 98.7 F (37.1 C)  TempSrc:  Oral Oral Oral  Resp:  18 18 20   Height:      Weight:    186 lb 1.1 oz (84.4 kg)  SpO2:  96% 97% 94%   Weight change: 5.9 oz (0.167 kg)  Intake/Output Summary (Last 24 hours) at 11/04/12 1003 Last data filed at 11/04/12 0757  Gross per 24 hour  Intake 2420.42 ml  Output      0 ml  Net 2420.42 ml   Vitals reviewed.  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 Dorsum of foot with 2 flaccid bullae.  Plantar surface of 5th metatarsal with large ulcer appearing necrotic. No tenderness to palpation. No bone visualized. Improved erythema, warmth and pitting edema extending from foot to below her L knee (marked w pen), firm. R foot with large callous over lateral plantar surface but no erythema/induration/edema/fluctuance/drainage. No pedal pulses appreciated, +2 femoral pulses b/l.  Neuro: alert and oriented X3, cranial nerves II-XII grossly intact, strength and sensation to light touch equal in bilateral upper and lower extremities  Lab Results: Basic Metabolic Panel:  Lab [id number] 0630 11/03/12 0500 11/02/12 0700 10/31/12 0650  NA 139 138 -- --  K 4.9 4.8 -- --  CL 112 111 -- --  CO2 18* 18* -- --  GLUCOSE 90 96 -- --  BUN 23 23 -- --  CREATININE 1.78* 1.83* -- --  CALCIUM 9.0 8.8 -- --  MG 1.8 -- 1.9 --  PHOS -- -- -- 4.6   Liver Function Tests:  Lab 10/29/12 2107  AST 12  ALT 7  ALKPHOS 139*  BILITOT 0.5  PROT 6.4  ALBUMIN 2.3*   CBC:  Lab 11/04/12 0630 11/03/12 0911 11/03/12 0500 10/29/12 1258  WBC 8.2 -- 6.5 --  NEUTROABS -- 4.6 -- 7.2  HGB 6.9* -- 7.0* --  HCT 20.6* -- 21.1* --  MCV 89.2 --  89.0 --  PLT 84* 79* -- --   CBG:  Lab 11/04/12 0807 11/03/12 2130 11/03/12 1718 11/03/12 1217 11/03/12 0753 11/02/12 2115  GLUCAP 95 120* 93 98 99 116*   Coagulation:  Lab 11/03/12 0911 10/30/12 1037  LABPROT 14.6 13.8  INR 1.16 1.07   Anemia Panel:  Lab 10/29/12 2107  VITAMINB12 568  FOLATE 5.4  FERRITIN 525*  TIBC 164*  IRON 57  RETICCTPCT 1.6   Urine Drug Screen: Drugs of Abuse     Component Value Date/Time   LABOPIA NEGATIVE 10/30/2009 0458   COCAINSCRNUR NEGATIVE 10/30/2009 0458   LABBENZ NEGATIVE 10/30/2009 0458   AMPHETMU NEGATIVE 10/30/2009 0458    Urinalysis:  Lab 10/30/12 1442  COLORURINE YELLOW  LABSPEC 1.017  PHURINE 5.5  GLUCOSEU NEGATIVE  HGBUR NEGATIVE  BILIRUBINUR NEGATIVE  KETONESUR NEGATIVE  PROTEINUR 30*  UROBILINOGEN 0.2  NITRITE  NEGATIVE  LEUKOCYTESUR NEGATIVE   Micro Results: Recent Results (from the past 240 hour(s))  CULTURE, BLOOD (ROUTINE X 2)     Status: Normal (Preliminary result)   Collection Time   10/29/12  6:40 PM      Component Value Range Status Comment   Specimen Description BLOOD ARM LEFT   Final    Special Requests BOTTLES DRAWN AEROBIC AND ANAEROBIC 10CC   Final    Culture  Setup Time 10/30/2012 01:20   Final    Culture     Final    Value:        BLOOD CULTURE RECEIVED NO GROWTH TO DATE CULTURE WILL BE HELD FOR 5 DAYS BEFORE ISSUING A FINAL NEGATIVE REPORT   Report Status PENDING   Incomplete   CULTURE, BLOOD (ROUTINE X 2)     Status: Normal (Preliminary result)   Collection Time   10/29/12  6:46 PM      Component Value Range Status Comment   Specimen Description BLOOD HAND RIGHT   Final    Special Requests     Final    Value: BOTTLES DRAWN AEROBIC AND ANAEROBIC BLUE 10CC RED 5CC   Culture  Setup Time 10/30/2012 01:20   Final    Culture     Final    Value:        BLOOD CULTURE RECEIVED NO GROWTH TO DATE CULTURE WILL BE HELD FOR 5 DAYS BEFORE ISSUING A FINAL NEGATIVE REPORT   Report Status PENDING   Incomplete   MRSA PCR SCREENING     Status: Normal   Collection Time   10/29/12  8:32 PM      Component Value Range Status Comment   MRSA by PCR NEGATIVE  NEGATIVE Final   SURGICAL PCR SCREEN     Status: Normal   Collection Time   11/01/12 10:50 AM      Component Value Range Status Comment   MRSA, PCR NEGATIVE  NEGATIVE Final    Staphylococcus aureus NEGATIVE  NEGATIVE Final    Medications: I have reviewed the patient's current medications. Scheduled Meds:    . carvedilol  6.25 mg Oral BID WC  . gabapentin  300 mg Oral TID  . insulin aspart  0-9 Units Subcutaneous TID WC  . isosorbide mononitrate  60 mg Oral Daily  . levofloxacin  750 mg Oral Q48H  . metroNIDAZOLE  500 mg Oral Q12H  . prednisoLONE acetate  1 drop Left Eye QID  . sodium chloride  3 mL Intravenous Q12H  . Travoprost (BAK  Free)  1 drop Both Eyes QHS  .  Vitamin D (Ergocalciferol)  50,000 Units Oral Q7 days   Continuous Infusions:   PRN Meds:.sodium chloride, acetaminophen, albuterol, hydrALAZINE, ondansetron (ZOFRAN) IV, ondansetron, oxyCODONE-acetaminophen, sodium chloride Assessment/Plan:  [name] is a 62 y.o. AA female with PMH of HTN, PVD s/p bilatearl fem-pop bypasses, CKD, hyperlipidemia, and DM presenting from foot care center for concern for left foot and lower leg infection.   1) Thrombocytopenia   Lab 11/04/12 0630 11/03/12 0911 11/03/12 0500 11/02/12 0700  HGB 6.9* -- 7.0* 7.5*  HCT 20.6* -- 21.1* 22.7*  WBC 8.2 -- 6.5 8.7  PLT 84* 79* 68* --   -Platelets down from admission (158)--slowly improving.  Possibly secondary to recent antibiotic use (on cefepime and vancomycin and was on Linezolid) vs chronic illness,  ?HIT--given [place] heparin initially for dvt prophylaxis.  Platelets were already low on admission and on lovenox prior to arrival.   - Cefepime and vancomycin have been discontinued - lovenox d/c 2/4 - b/l venous doppler negative for dvt - HIT panel pending - DIC panel: PT: 14.6, INR: 1.16, PTT: 45, Fibrinogen 525, D-Dimer 3.91, Platelets 79, no schistocytes on smear - appreciate pharmacy input--if HIT suspected, may give fondaparinux 2.5 Hendricks q24hrs (renal dosed), if dvt present, could use argatroban - Trend platelets  - discussed with hematology--Dr. 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  Nontoxic appearing on exam, afebrile, no leukocytosis, making sepsis less likely.  Foot x-ray did not show changes with OM with no acute fx or subluxation and no bone was visualized on exam.  CT left foot: plantar forefoot soft tissue ulceration, no abscess or OM evidence.  Prelim ABI: R 1.02 and L 0.41 with patent R and L fem-pop bypass grafts.  Received 1 g IV vancomycin in the ED.  No blood cultures drawn prior to antibiotic administration. Ortho evaluated day of admission and recommended vascular consultation.  - d/c vancomycin and cefepime.  Continue flagyl day 6 and levaquin q48 hours started on 2/3 for total of 14 days.   - f/u blood cultures x2--NGTD, but expect low yield in setting of IV vancomycin upon arrival prior to cultures  - Vascular followied, Dr. Sela Hilding threatening situation, S/p CO2 arteriogram 2/3 and vein mapping 2/4.  Not good candidate for prosthetic bypass.   Can be d/c from vascular point and follow up outpatient -b/l venous duplex neg for DVT    -f/u foot center on discharge  3) HTN  - Continue home Imdur and carvedilol.   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 Off of insulin since 01/2012. Blood sugar here 89.  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  - Continue home Coreg   6) ?Acute on Chronic CKD   Lab 11/04/12 0630 11/03/12 0500 11/02/12 0700 11/01/12 1303 11/01/12 0550  CREATININE 1.78* 1.83* 1.89* 1.79* 1.95*   Creatinine on admission 2.1, baseline appears ~1.6 . Trending down. May represent reaction to linezolid which was recently started in outpatient setting. Eating and drinking well with no evidence of dehydration on exam. No evidence of postrenal obstruction as patient reports urinating normally with no change in color or character of her urine.  UA neg for bacteria,  UNA 62, UCr 109.77--FeNA 0.07% suggestive of pre-renal - continue IVF  7) Normocytic anemia   Lab 11/04/12 0630 11/03/12 0911 11/03/12 0500 11/02/12 0700  HGB 6.9* -- 7.0* 7.5*  HCT 20.6* -- 21.1* 22.7*  WBC 8.2 -- 6.5 8.7  PLT 84* 79* 68* --   Hb on admission 8.1 with MCV 90. Prior Hb was 9.0 three weeks ago but 13.0  in November.  May represent anemia of chronic disease, however patient does have history of GI bleeding and ischemic colitis.  No recent diarrhea or blood in her stools. FOBT negative.  No symptomatic anemia or jaundice on exam.   Iron panel: Iron 57, TIBC 164, UIBC 107, b12 568 and folate 5.4.   -no active signs of bleeding, given fluids during admission, was net +7.8L since admission, Hb today likely dilutional, will follow up in outpatient.   -Continue to trend CBC  8) s/p hip fx and surgery 08/2012--apparently in [place] and transferred back to [facility] where she was sent to SNF for PT.  Follows up by Dr. Onnie Graham from ortho.  Recommends 3 months total of touch toe weight bearing.  Does not need to be in SNF for the PT as long as she is able to follow weight bearing instructions.   -rolling walker and instructions per PT--does not qualify for home health PT per insurace -f/u Dr. Onnie Graham as scheduled in March 2014  Diet: Carb modified   DVT Ppx: SCDs  Dispo: d/c home today with PT instructions and recommendations  The patient does have a current PCP (SHARDA, NEEMA, MD), therefore will be requiring OPC follow-up after discharge.  The patient does not have transportation limitations that hinder transportation to clinic appointments.   Services Needed at time of discharge: Y = Yes, Blank = No PT: touch toe weightbearing only for 3 months!  OT:   RN:   Equipment: Rolling walker  Other:     LOS: 6 days   Jerene Pitch 11/04/2012, 10:03 AM

## 2012-11-04 NOTE — Progress Notes (Signed)
Internal Medicine Teaching Service Attending Note Date: 11/04/2012  Patient name: [name] [name]  Medical record number: [id number]  Date of birth: [birth year]    This patient has been seen and discussed with the house staff. Please see their note for complete details. I concur with their findings with the following additions/corrections:  The patient's cellulitis component of her infection inflammation in her left leg has improved.-As surgeries are nontender vein during this hospitalization. She is anxious to go home. Platelets have improved since his today.  Have her finish a 14 a course of gram-negative antibiotics along with anaerobic coverage with metronidazole.  Need close followup with vascular surgery and primary care for it.  Is explicit on wanting not to go back to her skilled nursing facility where she was rehabbing from her orthopedic fracture but wanted to go home. We will relay this information to orthopedic surgeon and have her evaluated by physical therapy and occupational therapy along with the management.  Alcide Evener 11/04/2012, 12:49 PM

## 2012-11-04 NOTE — Progress Notes (Signed)
Physical Therapy Treatment Patient Details Name: SHAMSA HOPMAN MRN: [id number] DOB: 28-Jul-1951 Today's Date: 11/04/2012 Time: [id number] PT Time Calculation (min): 9 min  PT Assessment / Plan / Recommendation Comments on Treatment Session  Received call from MD after PT saw patient this morning.  MD reports new information that patient should be TDWB on Left leg due to previous hip fx.  MD asked PT to see patient again to ensure patient understand and could demonstrate TDWB.  PT saw patient and patient able to verbalize and demonstrate TDWB.  Patient also reports that she has a regular RW at home.  Feel patient remains safe for d/c home and has necessary equipment.  all d/c planning information entered by Theodoro Grist, PT remains current.     Follow Up Recommendations  Home health PT                       Precautions / Restrictions Precautions Precautions: Fall Restrictions Weight Bearing Restrictions: Yes LLE Weight Bearing: Touchdown weight bearing (MD notified PT at 1335 that pt TDWB)   Pertinent Vitals/Pain n/a    Mobility  Bed Mobility Bed Mobility:  (pt sitting EOB) Sit to Supine: 7: Independent Transfers Transfers: Sit to Stand;Stand to Sit Sit to Stand: 6: Modified independent (Device/Increase time) Stand to Sit: 6: Modified independent (Device/Increase time) Ambulation/Gait Ambulation/Gait Assistance: 6: Modified independent (Device/Increase time) Ambulation Distance (Feet): 20 Feet Assistive device: Rolling walker Ambulation/Gait Assistance Details: pt able to demonstrate and maintain TDWB during gait Gait Pattern: Step-to pattern Gait velocity: decreased      Visit Information  Last PT Received On: 11/04/12 Assistance Needed: +1             End of Session PT - End of Session Equipment Utilized During Treatment: Gait belt Activity Tolerance: Patient tolerated treatment well Patient left: in bed;with call bell/phone within reach Nurse Communication:  Mobility status   GP     Shanna Cisco, [place] 11/04/2012, 2:01 PM

## 2012-11-04 NOTE — Progress Notes (Signed)
RN to pass on to dayshift RN to recheck bp

## 2012-11-04 NOTE — Progress Notes (Signed)
Pt refuses to wear her SCDs although encouraged to wear them and told the potential risks of not wearing them.

## 2012-11-04 NOTE — Progress Notes (Signed)
Pt has an elevated bp of 169/81. Pt given 5mg  of hydralazine IV.

## 2012-11-04 NOTE — Discharge Summary (Signed)
[facility] Discharge Note  Name: [name] [name] MRN: [id number] DOB: 01-28-1951 62 y.o.  Date of Admission: 10/29/2012 12:02 PM Date of Discharge: 11/04/2012 Attending Physician: Dr. 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 pain      albuterol 108 (90 BASE) MCG/ACT inhaler   Commonly known as: PROVENTIL HFA;VENTOLIN HFA   Inhale 2 puffs into the lungs every 6 (six) hours as needed. For wheezing      carvedilol 6.25 MG tablet   Commonly known as: COREG   Take 6.25 mg by mouth 2 (two) times daily with a meal.      diphenoxylate-atropine 2.5-0.025 MG/5ML liquid   Commonly known as: LOMOTIL   Take 10 mLs by mouth 4 (four) times daily as needed. For diarrhea      ferrous sulfate 325 (65 FE) MG tablet   Take 325 mg by mouth daily with breakfast.      gabapentin 300 MG capsule   Commonly known as: NEURONTIN   Take 300 mg by mouth 3 (three) times daily.      isosorbide mononitrate 60 MG 24 hr tablet   Commonly known as: IMDUR   Take 60 mg by mouth daily.      levofloxacin 750 MG tablet   Commonly known as: LEVAQUIN   Take 1 tablet (750 mg total) by mouth every other day.   Start taking on: 11/05/2012      metroNIDAZOLE 500 MG  tablet   Commonly known as: FLAGYL   Take only 1 tablet (500MG ) by mouth tonight 11/04/12, and then resume 1 tablet by mouth every 12 hours until 11/12/12      oxyCODONE-acetaminophen 5-325 MG per tablet   Commonly known as: PERCOCET/ROXICET   Take 1-2 tablets by mouth every 4 (four) hours as needed. 1 tablet for moderate pain, 2 tablets for severe pain      prednisoLONE acetate 1 % ophthalmic suspension   Commonly known as: PRED FORTE   Place 1 drop into the left eye 4 (four) times daily.      Travoprost (BAK Free) 0.004 % Soln ophthalmic solution   Commonly known as: TRAVATAN   Place 1 drop into both eyes at bedtime.      Vitamin D (Ergocalciferol) 50000 UNITS Caps   Commonly known as: DRISDOL   Take 50,000 Units by mouth every 7 (seven) days. Takes on Mondays       Disposition and follow-up:   [name] was discharged from [facility] in stable condition.  At the hospital follow up visit please address:  Thrombocytopenia--concern for hit during this admission vs.  Abx induced.  Platelets as low as 68.  On d/c plt increased to 84.  Please repeat cbc on hospital follow up  Anemia--likely of chronic disease.  Hb on admission 8.1, Hb during admission ~7, on day of discharge down to 6.9, with no signs of bleeding and asymptomatic. Likely dilutional, given fluids during hospital course.  Please repeat cbc on hospital follow up.  Hx of GIB.  FOBT ordered but never done.    Necrotic foot ulcer/cellulitis--discharged on 14 day course of flagyl and levaquin.  Follow up with vascular and foot center.  Hx of hip fracture and surgery 08/2012--supposed to be on touchdown weight bearing only for 3 months since surgery per ortho.  Follows with Dr. Onnie Graham.  Has rolling walker at home, instructed on use and PT prior to discharge.    Follow-up Appointments:     Follow-up Information    Follow up with HO,MICHELE, MD. On 11/10/2012. (@1045 )    Contact information:   [place] [address] [phone]       Follow up with Marin Shutter, MD. Call in 1 month. (scheduled for March 2014, please call and confirm appiontment)    Contact information:   [address], [address], [address] [place] [address] [phone]       Follow up with Kendell Bane, DPM. Schedule an appointment as soon as possible for a visit on 11/05/2012. (@1045am )    Contact information:   [address] [place] [address] [phone]       Call DICKSON,CHRISTOPHER S, MD. (make an appointment to follow up with Dr. Scot Dock)    Contact information:   [address] [place] [address] [phone]         Discharge Orders    Future Appointments: Provider: Department: Dept Phone: Center:   11/10/2012 10:45 AM Ansel Bong, MD [place] [phone] [facility]   11/17/2012 2:00 PM Angelia Mould, MD [facility] [phone] [facility]     Future Orders Please Complete By Expires   Diet - low sodium heart healthy      Increase activity slowly      Scheduling Instructions:   TOUCH TOE WEIGHT BEARING ONLY WITH ASSISTANCE OF WALKER FOR 3 MONTHS   Other Restrictions      Comments:   TOUCH TOE WEIGHT BEARING ONLY WITH ASSISTANCE OF WALKER FOR 3 MONTHS   Change dressing (specify)      Comments:   PER FOOT CENTER RECOMMENDATIONS   Call MD for:  temperature >100.4      Call MD for:  severe uncontrolled pain      Call MD for:  redness, tenderness, or signs of infection (pain, swelling, redness, odor or green/yellow discharge around incision site)        Consultations: Orthopedic, vascular  Procedures Performed:  Dg Hip Complete Left  10/07/2012  *RADIOLOGY REPORT*  Clinical Data: Left hip pain post fall  LEFT HIP - COMPLETE 2+ VIEW  Comparison: None  Findings: Prior left acetabular reconstruction. Narrowing of left hip joint. SI joints and right hip joint  space preserved. Bones demineralized. Old screw tracks at proximal left femur. No acute fracture, dislocation or bone destruction.  IMPRESSION: Post left acetabular reconstruction. No acute abnormalities.   Original Report Authenticated By: Lavonia Dana, M.D.    Ct Foot Left Wo Contrast  10/30/2012  *RADIOLOGY REPORT*  Clinical Data: Left foot abscess.  Left foot swelling.  CT OF THE LEFT FOOT WITHOUT CONTRAST  Technique:  Multidetector CT imaging was performed according to the standard protocol. Multiplanar CT image reconstructions were also generated.  Comparison: None.  Findings: Study was performed without contrast due to renal insufficiency.  Diffuse osteopenia is present.  There is no fracture.  Atrophy of the foot musculature is present diffusely. Dystrophic changes of the toenails.   There is a healed fracture of the proximal phalanx of the great toe.  Diffuse forefoot soft tissue swelling is present.  Transmetatarsal amputation of the fifth ray is present. Amputation of the phalanges of the second toe is also noted.  There is no abscess.  There is a radiodense bandage material overlying the plantar are aspect of the fourth MTP joint, presumably the Iodoform wound dressing.   No osteomyelitis is present.  IMPRESSION: Plantar forefoot soft tissue ulcer with radiopaque dressing material.  No soft tissue abscess or CT evidence of osteomyelitis. Fifth ray transmetatarsal amputation.   Original Report Authenticated By: Dereck Ligas, M.D.    Dg Foot Complete Left  10/29/2012  *RADIOLOGY REPORT*  Clinical Data: Cellulitis  LEFT FOOT - COMPLETE 3+ VIEW  Comparison: 07/23/2012  Findings: Three views of the left foot submitted.  Again noted status post amputation of the fifth and second toe.  Again noted surgical absence of the distal fifth metatarsal.  No acute fracture or subluxation.  No periosteal reaction or bony erosion.  IMPRESSION: No acute fracture or subluxation.  Stable postsurgical changes as  described above.   Original Report Authenticated By: Lahoma Crocker, M.D.    Admission HPI: Ms. SUONG BIENAIME is a 62 y.o. woman with history significant for hypertension, PVD s/p bilatearl fem-pop bypasses, CKD, hyperlipidemia, and diabetes presenting from foot care Center for concern for left foot and lower leg infection. Patient has a history of diabetic foot ulcers and is followed by Dr. Amalia Hailey. She is currently living at [facility] in setting of recent left hip fracture.  She reports that she has been feeling well and progressing well at rehabilitation. She has been treated with oral antibiotics for left diabetic foot ulcer infection at the rehabilitation center. She failed treatment with doxycycline and was started on po linezolid. Has been on linezolid for 4 days now with no improvement per foot care center. She was told to go to the hospital today due to concern for progression of infection of her left foot and leg. She reports she has noticed some increase in swelling of her left lower leg or the past few days, but otherwise has been without pain. She is ambulating at her rehabilitation center with a walker.  She has prior left fifth and second toe amputation due to diabetic ulcer disease complicated by severe peripheral vascular disease.  She denies any fever chills, nausea or vomiting, dizziness, palpitations, chest pain, shortness of breath, abdominal pain, diarrhea, dysuria, or headache.  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  s/p left femoral to below knee popliteal bypass 03.  Sent from foot center.  Seen by Dr. Scot Dock in the past.  Nontoxic appearing on exam, afebrile, no leukocytosis, making sepsis less likely.  Foot x-ray did not show changes with OM with no acute fx or subluxation and no bone was visualized  on exam. CT left foot showed plantar forefoot  soft tissue ulceration, no abscess or OM evidence. ABI: R 1.02 and L 0.41 with patent R and L fem-pop bypass grafts. Received 1 g IV vancomycin in the ED. No blood cultures drawn prior to antibiotic administration. Ortho evaluated day of admission and recommended vascular consultation.  Vascular was consulted and Dr. Scot Dock was following during admission. Limb threatening situation, S/p CO2 arteriogram 2/3 and vein mapping 2/4. Not good candidate for prosthetic bypass. Dr. Scot Dock will follow up as an outpatient .  Started on vancomycin and cefepime initially for concern of cellulitis.  Also placed on flagyl and levaquin.  vancomyin and cefepime discontinued secondary to thrombocytopenia.  Continued on flagyl and levaquin for total 14 day course.  Blood cultures were drawn and still pending with no growth to date, however,  expect low yield in setting of IV vancomycin started upon arrival prior to cultures.  Recommended to also follow up in foot center as an outpatient.     HYPERTENSION--monitored during hospital course.  Continued on home dose IMDUR and carvedilol and on discharge.  Follow up with PCP.     Type 2 diabetes mellitus with diabetic chronic kidney disease--Diet controlled at home. Off of insulin since 01/2012. cbg monitored during hospital course.  Not on insulin therapy.  Follow up with pcp.  Hb A1C 5.2 07/2012.      Thrombocytopenia---Platelets down from admission (158)--slowly improving at time of discharge.  Platelets up to 84 at time of discharge. Possibly secondary to recent antibiotic use (on cefepime and vancomycin and was on Linezolid) vs chronic illness, ?HIT--given [place] heparin initially for dvt prophylaxis.  Platelets were already low on admission and on lovenox prior to arrival.  Cefepime and vancomycin were discontinued along with lovenox.  She was placed on SCDs.  b/l venous doppler was negative for dvt .  HIT panel is still pending.   DIC panel was done: PT: 14.6, INR: 1.16, PTT: 45, Fibrinogen 525, D-Dimer 3.91, Platelets 79, no schistocytes on smear.  Per pharmacy, if HIT suspected, may give fondaparinux 2.5 [place] q24hrs (renal dosed), if dvt present, could use argatroban.  Platelets were monitored closely during hospital course.  We discussed the thrombocytopenia with hematology--Dr. Curlene Dolphin on the phone 2/5 who reccommended only start alternative anticoagulation if thrombosis is confirmed.  Any alternative anticoagulation was not started.  Recommended close follow up with pcp and monitor platelets.      Anemia--normocytic and likely of chronic disease--CKD.  Hb was 8.1 on admission with MCV 90. Prior Hb was 9.0 three weeks ago but 13.0 in November and monitored closely during this admission.  May represent anemia of chronic disease, however she does have history of GI bleeding and ischemic colitis.  No recent diarrhea or blood in her stools during hospital course. FOBT negative.  Iron panel: Iron 57, TIBC 164, UIBC 107, b12 568 and folate 5.4.  No symptomatic anemia or jaundice on exam.  No active signs of bleeding, was given fluids during admission, was net +7.8L since admission.  Hb on admission 6.9, likely dilutional, will need to be closely followed up in outpatient.   TOBACCO USER--smoking cessation strongly advised.  Follow up with pcp.    CAD w h/o cardiac arrest s/p pacemaker--No anginal symptoms.  Continued on home coreg.  F/u with pcp and cardiology as an outpatient.    Acute on Chronic CKD--Creatinine on admission 2.1, baseline appears ~1.6. Trending down, relatively at baseline on discharge with Cr 1.78.  May represent reaction to linezolid which was recently  started in outpatient setting.  Eating and drinking well with no evidence of dehydration on exam.  No evidence of postrenal obstruction as patient reports urinating normally with no change in color or character of her urine.  UA was neg for bacteria, UNA 62, UCr  109.77--FeNA 0.07% suggestive of pre-renal etiology.  Given IVF during hospital course.    S/P hip fx and surgery 08/2012--apparently in [place] and transferred back to [facility] where she was sent to SNF for PT. Follows up by Dr. Onnie Graham from ortho.  Spoke to Dr. Onnie Graham on day of discharge, he recommends 3 months total of touch toe weight bearing.  Does not need to be in SNF for the PT as long as she is able to follow weight bearing instructions.  Evaluated by PT, does not qualify for home health PT per insurance.  Has rolling walker at home, instructed to use walker by PT for touchdown weight bearing at home.  Will need to follow up with Dr. Onnie Graham and foot center as an outpatient.    Discharge Vitals:  BP 169/81  Pulse 64  Temp 98.7 F (37.1 C) (Oral)  Resp 20  Ht 5\' 2"  (1.575 m)  Wt 186 lb 1.1 oz (84.4 kg)  BMI 34.03 kg/m2  SpO2 94%  Discharge Labs:  Results for orders placed during the hospital encounter of 10/29/12 (from the past 24 hour(s))  GLUCOSE, CAPILLARY     Status: Normal   Collection Time   11/03/12  5:18 PM      Component Value Range   Glucose-Capillary 93  70 - 99 mg/dL   Comment 1 Documented in Chart     Comment 2 Notify RN    GLUCOSE, CAPILLARY     Status: Abnormal   Collection Time   11/03/12  9:30 PM      Component Value Range   Glucose-Capillary 120 (*) 70 - 99 mg/dL  CBC     Status: Abnormal   Collection Time   11/04/12  6:30 AM      Component Value Range   WBC 8.2  4.0 - 10.5 K/uL   RBC 2.31 (*) 3.87 - 5.11 MIL/uL   Hemoglobin 6.9 (*) 12.0 - 15.0 g/dL   HCT 20.6 (*) 36.0 - 46.0 %   MCV 89.2  78.0 - 100.0 fL   MCH 29.9  26.0 - 34.0 pg   MCHC 33.5  30.0 - 36.0 g/dL   RDW 14.6  11.5 - 15.5 %   Platelets 84 (*) 150 - 400 K/uL  BASIC METABOLIC PANEL     Status: Abnormal   Collection Time   11/04/12  6:30 AM      Component Value Range   Sodium 139  135 - 145 mEq/L   Potassium 4.9  3.5 - 5.1 mEq/L   Chloride 112  96 - 112 mEq/L   CO2 18 (*) 19 - 32 mEq/L   Glucose,  Bld 90  70 - 99 mg/dL   BUN 23  6 - 23 mg/dL   Creatinine, Ser 1.78 (*) 0.50 - 1.10 mg/dL   Calcium 9.0  8.4 - 10.5 mg/dL   GFR calc non Af Amer 30 (*) >90 mL/min   GFR calc Af Amer 35 (*) >90 mL/min  MAGNESIUM     Status: Normal   Collection Time   11/04/12  6:30 AM      Component Value Range   Magnesium 1.8  1.5 - 2.5 mg/dL  RETICULOCYTES     Status:  Abnormal   Collection Time   11/04/12  6:30 AM      Component Value Range   Retic Ct Pct 0.4  0.4 - 3.1 %   RBC. 2.30 (*) 3.87 - 5.11 MIL/uL   Retic Count, Manual 9.2 (*) 19.0 - 186.0 K/uL  GLUCOSE, CAPILLARY     Status: Normal   Collection Time   11/04/12  8:07 AM      Component Value Range   Glucose-Capillary 95  70 - 99 mg/dL   Comment 1 Documented in Chart     Comment 2 Notify RN    BASIC METABOLIC PANEL     Status: Abnormal   Collection Time   11/04/12  9:24 AM      Component Value Range   Sodium 140  135 - 145 mEq/L   Potassium 5.0  3.5 - 5.1 mEq/L   Chloride 112  96 - 112 mEq/L   CO2 18 (*) 19 - 32 mEq/L   Glucose, Bld 124 (*) 70 - 99 mg/dL   BUN 24 (*) 6 - 23 mg/dL   Creatinine, Ser 1.81 (*) 0.50 - 1.10 mg/dL   Calcium 9.0  8.4 - 10.5 mg/dL   GFR calc non Af Amer 29 (*) >90 mL/min   GFR calc Af Amer 34 (*) >90 mL/min  GLUCOSE, CAPILLARY     Status: Abnormal   Collection Time   11/04/12 11:31 AM      Component Value Range   Glucose-Capillary 108 (*) 70 - 99 mg/dL   Signed: Jerene Pitch 11/04/2012, 4:04 PM   Time Spent on Discharge: 45 minutes Services Ordered on Discharge: none Equipment Ordered on Discharge: rolling walker--already at home

## 2012-11-04 NOTE — Progress Notes (Signed)
Physical Therapy Evaluation Patient Details Name: [name] [name] MRN: [id number] DOB: 1951/08/27 Today's Date: 11/04/2012 Time: [id number] PT Time Calculation (min): 37 min  PT Assessment / Plan / Recommendation Clinical Impression  Pt is a 62 yo female s/p angiogram with a  nonhealing wound on her Left foot. Pt does demonstrate Mod Independence with basic mobility using a RW. Pt is safe to D/C home today. Pt does however have some Left ankle rigidity and loss of ROM affecting her gait. Pt has compensated for this and  is Independent with RW. Do feel pt would benefit from a foot clinic to address treatrment plan. Pt does have peripheral neuropathy also contributing to her need for an assistive device. Feel pt would benefit from HHPT to focus on Independence in home and formulate a ROM plan  to maintain and/or improve ankle ROM.    PT Assessment  All further PT needs can be met in the next venue of care    Follow Up Recommendations  Home health PT    Does the patient have the potential to tolerate intense rehabilitation      Barriers to Discharge        Equipment Recommendations       Recommendations for Other Services     Frequency      Precautions / Restrictions Precautions Precautions: Fall Restrictions Weight Bearing Restrictions: No (per chart, no limitations)   Pertinent Vitals/Pain       Mobility  Bed Mobility Bed Mobility: Sit to Supine Sit to Supine: 7: Independent Transfers Transfers: Sit to Stand Sit to Stand: 6: Modified independent (Device/Increase time) Ambulation/Gait Ambulation/Gait Assistance: 6: Modified independent (Device/Increase time) Assistive device: Rolling walker Ambulation/Gait Assistance Details: left foot inverted with little to no DF, WB on lateral aspect of foot Gait Pattern: Step-to pattern;Decreased step length - left;Decreased dorsiflexion - left Gait velocity: decreased    Exercises     PT Diagnosis: Difficulty walking  PT Problem  List: Decreased strength;Decreased range of motion;Decreased activity tolerance;Decreased balance;Decreased mobility PT Treatment Interventions:     PT Goals    Visit Information  Last PT Received On: 11/04/12    Subjective Data  Subjective: I would like to go home. Patient Stated Goal: To be Independent again.   Prior Functioning  Home Living Lives With: Other (Comment) (sister) Available Help at Discharge: Family Type of Home: Apartment Home Access: Level entry Home Layout: One level Home Adaptive Equipment: Environmental consultant - four wheeled;Straight cane Prior Function Level of Independence: Independent with assistive device(s) Vocation: On disability Communication Communication: No difficulties    Cognition  Cognition Overall Cognitive Status: Appears within functional limits for tasks assessed/performed Arousal/Alertness: Awake/alert Orientation Level: Appears intact for tasks assessed Behavior During Session: [facility] for tasks performed    Extremity/Trunk Assessment Right Lower Extremity Assessment RLE ROM/Strength/Tone: [facility] for tasks assessed RLE Sensation: History of peripheral neuropathy Left Lower Extremity Assessment LLE ROM/Strength/Tone: Deficits LLE ROM/Strength/Tone Deficits: ankle very rigid with decreased ROM/strength, skin very taught from knee down, lateral wound around 5 metatarsal, another on dorsal aspect of foot, 2 and 5 toe amputated LLE Sensation: History of peripheral neuropathy Trunk Assessment Trunk Assessment: Normal   Balance Balance Balance Assessed: Yes Static Standing Balance Static Standing - Balance Support: Bilateral upper extremity supported Static Standing - Level of Assistance: 6: Modified independent (Device/Increase time)  End of Session PT - End of Session Equipment Utilized During Treatment: Gait belt Activity Tolerance: Patient tolerated treatment well Patient left: in bed;with call bell/phone within reach  Nurse Communication: Mobility  status  GP     [name] [name] 11/04/2012, 11:48 AM

## 2012-11-04 NOTE — Progress Notes (Signed)
DC HOME WITH SISTER, VERBALLY UNDERSTOOD DC INSTRUCTIONS, NO QUESTIONS ASK.

## 2012-11-04 NOTE — Discharge Summary (Signed)
Internal Medicine Teaching Service Attending Note Date: 11/04/2012  Patient name: [name] [name]  Medical record number: [id number]  Date of birth: 03-05-1951    This patient has been seen and discussed with the house staff. Please see their note for complete details. I concur with their findings with the following additions/corrections:  See my separate note on day of [place] 11/04/2012, 9:40 PM

## 2012-11-04 NOTE — Telephone Encounter (Signed)
-----   Message ----- From: Angelia Mould, MD Sent: 11/04/2012 1:57 PM To: Alfonso Patten, RN, Sherrye Payor, RN Subject: f/u [place] Breeding went home. She needs a f/u visit in 3-4 weeks. Thanks CD  I scheduled an appt for this pt on 3.5/14 at 10:30am w/ [facility]. I spoke to the pt and also mailed her an appt letter.awt

## 2012-11-04 NOTE — Care Management Note (Addendum)
    Page 1 of 1   11/04/2012     2:43:31 PM   CARE MANAGEMENT NOTE 11/04/2012  Patient:  [name] [name], [name] [name]   Account Number:  [id number]  Date Initiated:  11/04/2012  Documentation initiated by:  Tomi Bamberger  Subjective/Objective Assessment:   dx hyperlipdemia, recent hip surgery  admit- from [facility]     Action/Plan:   pt eval- pt will need TDWB on left leg (recent hip surgery)   Anticipated DC Date:  11/04/2012   Anticipated DC Plan:  [place] referral  Clinical Social Worker      DC Planning Services  CM consult      Choice offered to / List presented to:             Status of service:  Completed, signed off Medicare Important Message given?   (If response is "NO", the following Medicare IM given date fields will be blank) Date Medicare IM given:   Date Additional Medicare IM given:    Discharge Disposition:  [place]  Per UR Regulation:  Reviewed for med. necessity/level of care/duration of stay  If discussed at [place] of Stay Meetings, dates discussed:    Comments:  11/04/12 11:07 Tomi Bamberger RN, BSN [phone] patient is from [facility], patient refuses to go back to a snf , she wants to go home.  Patient has an aide with [employer].  Patient has some wounds on her foot,but no dressings are being done,  patient has a w/chair and a rolling walker at home. Patient is for dc to day, await pt eval, patient will need to be TDWB on left leg since had recent hip surgery  and patient states she can do this she has a rolling walker at home.

## 2012-11-05 LAB — CULTURE, BLOOD (ROUTINE X 2): Culture: NO GROWTH

## 2012-11-05 LAB — HEPARIN INDUCED THROMBOCYTOPENIA PNL: Heparin Induced Plt Ab: NEGATIVE

## 2012-11-09 ENCOUNTER — Telehealth: Payer: Self-pay | Admitting: Dietician

## 2012-11-09 NOTE — Telephone Encounter (Signed)
Discharge date:11-04-12 Call date: 11-09-12 Hospital follow up appointment date: 11-10-12  Calling to assist with transition of care from hospital to home.  Discharge medications reviewed:NO- patient did not know her medicines says she takes  what her sister tells her to take  Able to fill all prescriptions? She thinks so, but leaves that to her sister who is not available right now  Patient aware of hospital follow up appointments. She was not- had her write it down along with office phone number in case they cannot make the appointment  No problems with transportation.- Aylla thinks her sister with whom she lives will bring her to her appointment  Other problems/concerns: says she is still in some pain.

## 2012-11-10 ENCOUNTER — Ambulatory Visit (INDEPENDENT_AMBULATORY_CARE_PROVIDER_SITE_OTHER): Payer: Medicaid Other | Admitting: Internal Medicine

## 2012-11-10 ENCOUNTER — Encounter: Payer: Self-pay | Admitting: Internal Medicine

## 2012-11-10 ENCOUNTER — Telehealth: Payer: Self-pay | Admitting: *Deleted

## 2012-11-10 VITALS — BP 149/67 | HR 61 | Temp 98.4°F | Ht 62.0 in | Wt 184.4 lb

## 2012-11-10 DIAGNOSIS — L97509 Non-pressure chronic ulcer of other part of unspecified foot with unspecified severity: Secondary | ICD-10-CM

## 2012-11-10 DIAGNOSIS — E1122 Type 2 diabetes mellitus with diabetic chronic kidney disease: Secondary | ICD-10-CM

## 2012-11-10 DIAGNOSIS — Z79899 Other long term (current) drug therapy: Secondary | ICD-10-CM

## 2012-11-10 DIAGNOSIS — N184 Chronic kidney disease, stage 4 (severe): Secondary | ICD-10-CM

## 2012-11-10 DIAGNOSIS — E785 Hyperlipidemia, unspecified: Secondary | ICD-10-CM

## 2012-11-10 DIAGNOSIS — N183 Chronic kidney disease, stage 3 unspecified: Secondary | ICD-10-CM

## 2012-11-10 DIAGNOSIS — I129 Hypertensive chronic kidney disease with stage 1 through stage 4 chronic kidney disease, or unspecified chronic kidney disease: Secondary | ICD-10-CM

## 2012-11-10 DIAGNOSIS — D649 Anemia, unspecified: Secondary | ICD-10-CM

## 2012-11-10 DIAGNOSIS — I10 Essential (primary) hypertension: Secondary | ICD-10-CM

## 2012-11-10 DIAGNOSIS — D696 Thrombocytopenia, unspecified: Secondary | ICD-10-CM

## 2012-11-10 LAB — CBC WITH DIFFERENTIAL/PLATELET
Basophils Absolute: 0 10*3/uL (ref 0.0–0.1)
Basophils Relative: 0 % (ref 0–1)
Eosinophils Relative: 5 % (ref 0–5)
HCT: 23 % — ABNORMAL LOW (ref 36.0–46.0)
MCHC: 31.3 g/dL (ref 30.0–36.0)
MCV: 93.1 fL (ref 78.0–100.0)
Monocytes Absolute: 0.7 10*3/uL (ref 0.1–1.0)
RDW: 16.6 % — ABNORMAL HIGH (ref 11.5–15.5)

## 2012-11-10 LAB — COMPLETE METABOLIC PANEL WITH GFR
AST: 14 U/L (ref 0–37)
Alkaline Phosphatase: 124 U/L — ABNORMAL HIGH (ref 39–117)
BUN: 14 mg/dL (ref 6–23)
Calcium: 9 mg/dL (ref 8.4–10.5)
Chloride: 117 mEq/L — ABNORMAL HIGH (ref 96–112)
Creat: 1.89 mg/dL — ABNORMAL HIGH (ref 0.50–1.10)
GFR, Est Non African American: 29 mL/min — ABNORMAL LOW

## 2012-11-10 LAB — GLUCOSE, CAPILLARY: Glucose-Capillary: 96 mg/dL (ref 70–99)

## 2012-11-10 LAB — POCT GLYCOSYLATED HEMOGLOBIN (HGB A1C): Hemoglobin A1C: 5.4

## 2012-11-10 MED ORDER — ISOSORBIDE MONONITRATE ER 60 MG PO TB24: 60.0000 mg | ORAL_TABLET | Freq: Every day | ORAL | Status: DC

## 2012-11-10 MED ORDER — LISINOPRIL 10 MG PO TABS: 10.0000 mg | ORAL_TABLET | Freq: Every day | ORAL | Status: DC

## 2012-11-10 MED ORDER — FUROSEMIDE 80 MG PO TABS: 80.0000 mg | ORAL_TABLET | Freq: Every day | ORAL | Status: DC

## 2012-11-10 MED ORDER — ROSUVASTATIN CALCIUM 10 MG PO TABS: 10.0000 mg | ORAL_TABLET | Freq: Every day | ORAL | Status: DC

## 2012-11-10 NOTE — Assessment & Plan Note (Signed)
Left foot ulcer is healing well. Will finish Levaquin and Flagyl as prescribed. she has about 4-5 days left of antibiotics

## 2012-11-10 NOTE — Assessment & Plan Note (Signed)
Although Dr. Burnard Bunting has called in Crestor for patient however she has not been taking this medication. -resume Crestor 10 mg by mouth each bedtime

## 2012-11-10 NOTE — Progress Notes (Signed)
Patient ID: [name] [name], female   DOB: [birth year], 62 y.o.   MRN: [id number] History of present illness: [name] [name] is a 62 yo woman with a complex PMH of Anemia, hypertension, diabetes, recent left hip fracture in December 2013 followed by Dr. Onnie Graham, severe PVD s/p left second and fifth toes amputations who was recently admitted to the hospital from January 31 2 February 6 for a left foot ulcer.  Patient was originally on doxycycline which she failed and then was placed on linezolid without improvement. She was admitted to the hospital and was started on vancomycin and cefepime which was subsequently discontinued due to her thrombocytopenia (plt 79K).  DIC panel was drawn with the following results: aPTT 45, fibrinogen 525, d-dimer 3.9, no schistocytes.  She was then sent home on Levaquin and Flagyl x 14 days.  Patient has poor wound healing in setting of severe PVD. s/p left femoral to below knee popliteal bypass 03.  During hospital course, vascular was consulted and Dr. Scot Dock was following during admission given Limb threatening situation, S/p CO2 arteriogram 2/3 and vein mapping 2/4but she was deemed Not good candidate for prosthetic bypass. Blood cultures x 2 on 1/31 were negative. The concern is anemia which hemoglobin dropped to 6.9 on day of discharge. Prior to that her hemoglobin was 13 back in November.  Anemia panel review ferritin of 525,iron 57, iron sat 35.  Since hospital discharge,she has been doing well. She has been taking her antibiotics as prescribed and reports no side effects. Since then she has seen the foot doctor as well as the vascular surgeon. Patient's sister states that she used to be on Lasix 40 mg daily but does not know why she is no longer on this medication. She also wants to note what the Crestor was stopped. Was refill on Imdur.  Review of system:  Physical examination: General: alert, well-developed, and cooperative to examination.  Lungs: normal respiratory  effort, no accessory muscle use, normal breath sounds, no crackles, and no wheezes. Heart: normal rate, regular rhythm, no murmur, no gallop, and no rub.  Abdomen: soft, non-tender, normal bowel sounds, no distention, no guarding, no rebound tenderness Neurologic: nonfocal Ext: bilateral lower extremity right greater than left +2 pitting edema up to umbilicus with chronic venous stasis changes more on the left the right. No increase in warmth or drainage noted. A left foot: no increase in edema, erythema, or drainage. It was wrapped in Curlex which I did open to examine. 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   Psych: appropriate

## 2012-11-10 NOTE — Assessment & Plan Note (Signed)
Patient does have stage III CKD with GFR of 34. This is likely secondary to her diabetes as well as hypertension.  -will refer patient to nephrology -repeat CMP today

## 2012-11-10 NOTE — Patient Instructions (Addendum)
Will get labs today and I will call you with any abnormal lab results Resume Lasix at 80 mg daily Please check your weight at home and if it increases more than 3-4 pounds, please call your doctor So that she can adjust her Lasix dose Please finish the antibiotics that were prescribed to you from the hospital Make sure to followup with the vascular surgeon Followup with Dr. Burnard Bunting in 2-4 weeks

## 2012-11-10 NOTE — Telephone Encounter (Signed)
Call to patient informed her that she is being referred to a Kidney doctor to follow her for her Kidney function decrease.   Pt was also informed per order of Dr. Silverio Decamp that her hemoglobin is stable and that her other labs are within normal limits.   Pt voiced an understanding of the plan.  Sander Nephew, RN 11/10/2012 1:59 PM

## 2012-11-10 NOTE — Assessment & Plan Note (Signed)
This is likely secondary to chronic disease in addition to her CK D. Her hemoglobin has been trending down during hospital admission to 6.9 on day of discharge. Baseline back in November was 13. I am not sure why she has this acute drop in her hemoglobin.anemia panel showed a ferritin of 525 which support anemia of chronic disease. -will repeat CBC today -May need to do an FOBT next visit if her Hb is not back to normal -she needs to stop taking iron supplement

## 2012-11-10 NOTE — Assessment & Plan Note (Addendum)
BP Readings from Last 3 Encounters:  11/10/12 149/67  11/04/12 169/81  11/04/12 169/81    Lab Results  Component Value Date   NA 140 11/04/2012   K 5.0 11/04/2012   CREATININE 1.81* 11/04/2012    Assessment:  Blood pressure control:  adequately controlled  Progress toward BP goal:   somewhat   Plan:  Medications:  Will continue current medications including lisinopril 10, Coreg 6.25 twice a day, Imdur 60 mg daily. Will add Lasix 80 mg daily (she used to be on Lasix 40mg  qd). And repeat a BMP in 2 weeks.  I also asked patient to weigh herself a home and to notify her PCP of significant weight change  Educational resources provided: brochure  Self management tools provided:    Other plans: check CMP today

## 2012-11-10 NOTE — Assessment & Plan Note (Signed)
Unclear etiology. It was thought that her thrombocytopenia could have been antibiotics induced versus HIT.  She was instructed to stop Lovenox at time of discharge. She denies any signs of bleeding. -Will repeat CBC today

## 2012-11-17 ENCOUNTER — Ambulatory Visit: Payer: Medicaid Other | Admitting: Vascular Surgery

## 2012-11-17 ENCOUNTER — Encounter: Payer: Self-pay | Admitting: Vascular Surgery

## 2012-11-30 ENCOUNTER — Encounter: Payer: Self-pay | Admitting: Vascular Surgery

## 2012-12-01 ENCOUNTER — Encounter: Payer: Self-pay | Admitting: Vascular Surgery

## 2012-12-01 ENCOUNTER — Ambulatory Visit (INDEPENDENT_AMBULATORY_CARE_PROVIDER_SITE_OTHER): Payer: Medicaid Other | Admitting: Vascular Surgery

## 2012-12-01 ENCOUNTER — Telehealth: Payer: Self-pay

## 2012-12-01 ENCOUNTER — Other Ambulatory Visit: Payer: Self-pay

## 2012-12-01 VITALS — BP 176/60 | HR 66 | Ht 62.0 in | Wt 147.0 lb

## 2012-12-01 DIAGNOSIS — I739 Peripheral vascular disease, unspecified: Secondary | ICD-10-CM

## 2012-12-01 DIAGNOSIS — L98499 Non-pressure chronic ulcer of skin of other sites with unspecified severity: Secondary | ICD-10-CM
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
## 2012-12-01 NOTE — Assessment & Plan Note (Signed)
This is a difficult situation. She has extensive wounds on the left foot involving a full thickness wound on the left heel and a wound over the fourth metatarsal. Her arteriogram shows that her femoral to above-knee popliteal artery bypass graft is patent her popliteal artery is occluded and her only runoff is the peroneal and anterior tibial artery which are small. The situation is further complicated by the fact that I really do not see any reasonable greater saphenous vein or lesser saphenous vein to use for a bypass. In addition, she continues to smoke. All things considered, I have recommended primary left below the knee amputation as I think this would be associated with her best chance of ambulating as soon as possible. I think that femoral to anterior tibial artery bypass grafting would have a very small chance for success and would be at high risk for thrombosis given the lack of adequate autogenous conduit. In addition, even if the bypass is successful, given the extent of the wounds, I think that the limb may not be salvageable. The situation is further complicated by her continued smoking. However, after extensive discussion the patient feels strongly that she would like to pursue revascularization despite the risks and very low chance for success. Therefore she is scheduled for a femoral to anterior tibial artery bypass grafting on 12/16/2012 given the extent of the wounds in the left foot I think the anterior tibial artery is a better distal target than the peroneal artery as this would likely allow better perfusion of the distal foot. I have reviewed the indications for lower extremity bypass. I have also reviewed the potential complications of surgery including but not limited to: wound healing problems, infection, graft thrombosis, limb loss, or other unpredictable medical problems. All the patient's questions were answered and they are agreeable to proceed.

## 2012-12-01 NOTE — Telephone Encounter (Signed)
Per v.o. Dr. Scot Dock, notified pt's. PCP, Dr. Burnard Bunting of low Hgb. of 7.0, and of plan for surgery / (L) Fem- Ant. Tib. Bypass on 12/16/12.  Per Dr. Burnard Bunting, she will review pt's medical record and contact pt. for further evaluation/ treatment of low Hgb., pre-operatively.

## 2012-12-02 ENCOUNTER — Encounter (HOSPITAL_COMMUNITY): Payer: Self-pay | Admitting: Pharmacy Technician

## 2012-12-06 ENCOUNTER — Encounter: Payer: Self-pay | Admitting: Internal Medicine

## 2012-12-06 ENCOUNTER — Ambulatory Visit (INDEPENDENT_AMBULATORY_CARE_PROVIDER_SITE_OTHER): Payer: Medicaid Other | Admitting: Internal Medicine

## 2012-12-06 VITALS — BP 175/81 | HR 68 | Temp 97.9°F | Ht 62.0 in | Wt 145.9 lb

## 2012-12-06 DIAGNOSIS — N189 Chronic kidney disease, unspecified: Secondary | ICD-10-CM

## 2012-12-06 DIAGNOSIS — D649 Anemia, unspecified: Secondary | ICD-10-CM

## 2012-12-06 DIAGNOSIS — D62 Acute posthemorrhagic anemia: Secondary | ICD-10-CM

## 2012-12-06 DIAGNOSIS — D638 Anemia in other chronic diseases classified elsewhere: Secondary | ICD-10-CM

## 2012-12-06 LAB — CBC
Hemoglobin: 10.3 g/dL — ABNORMAL LOW (ref 12.0–15.0)
MCHC: 32.6 g/dL (ref 30.0–36.0)
Platelets: 261 10*3/uL (ref 150–400)

## 2012-12-06 NOTE — Progress Notes (Signed)
Subjective:     Patient ID: [name] [name], female   DOB: 1950/12/20, 62 y.o.   MRN: [id number]  HPI The patient is a 62 year old female who comes in today for a unknown reason. She states she does not know why she was call to come in however she states our clinic did call her and told her that there was some blood work that was abnormal and she needed to come in immediately. She has extensive past medical history, however the past medical history that is relevant to his type 2 diabetes with kidney disease. She also has peripheral vascular disease and has extensive ulceration of the left lower extremity. She has multiple wounds including one that is deep to the bone on the heel and several on the digits. She does have a history of very poor blood flow in the tibial artery and it is felt that this is not amenable to surgical intervention. It is thought that she will need a left BKA. She is scheduled for surgery on March 20 however it was appears that the vascular surgeon did see her in the office recently and noted the low hemoglobin of 7.2 back in February. He does have some concern that she may need some kind of transfusion prior to surgery and wanted our office to evaluate her. She has not had any dark stools or acute blood loss that she is aware of. She did have colonoscopy in the past. She did have history of thrombocytopenia that was associated with the medication and her platelets did normalize after the medication was stopped. She is not having any complaints including shortness of breath at today's visit. She's not having any chest pain. She does have a lot of pain in her left foot and it feels almost that in that she has to help lift it up when she is walking with a walker.  Review of Systems  Constitutional: Positive for fatigue. Negative for fever, chills, diaphoresis, activity change, appetite change and unexpected weight change.  Gastrointestinal: Negative for nausea, vomiting, abdominal pain,  diarrhea and constipation.  Endocrine: Negative for cold intolerance, heat intolerance, polydipsia, polyphagia and polyuria.  Musculoskeletal: Positive for myalgias and arthralgias.  Skin: Positive for pallor and wound.  Neurological: Negative for dizziness, tremors, seizures, syncope, facial asymmetry, speech difficulty, weakness, light-headedness, numbness and headaches.  Hematological: Negative for adenopathy. Does not bruise/bleed easily.       Objective:   Physical Exam  Constitutional: She is oriented to person, place, and time. She appears well-developed. No distress.  HENT:  Head: Normocephalic and atraumatic.  Eyes: EOM are normal. Pupils are equal, round, and reactive to light.  Neck: Normal range of motion. Neck supple. No JVD present. No tracheal deviation present. No thyromegaly present.  Cardiovascular: Normal rate and normal heart sounds.   Pulmonary/Chest: Effort normal. No respiratory distress. She has no wheezes. She has no rales.  Abdominal: Soft. Bowel sounds are normal. She exhibits no distension. There is no tenderness. There is no rebound.  Musculoskeletal: Normal range of motion. She exhibits tenderness. She exhibits no edema.  Pain in her left leg  Neurological: She is alert and oriented to person, place, and time.  Skin: Skin is dry. She is not diaphoretic.  Feet cool especially the left foot. Ulcerations on the left foot including heel and digits.        Assessment/Plan:   1. Asymptomatic anemia-the patient does have a hemoglobin of 7.2 back in February. She does have chronic kidney disease  which may limit region aeration. She is not currently on erythropoietin. She will be given stool cards to check for heme positive stools. We will check a CBC at today's visit. If she does continue to have a low CBC there may be a chance that preoperative transfusion may be beneficial given the chance for blood loss during this BKA procedure. It would appear in the past  ferritin levels have been high and thus this may be related to anemia of chronic disease and she may benefit from erythropoietin.  2. Disposition-the patient will be called back with her results and we will coordinate with the vascular surgeon's office for BKA on March 20. She will be seen after the BKA for followup in our office.

## 2012-12-06 NOTE — Patient Instructions (Signed)
We will call you with the results of your blood work and talk with the vascular surgeons about the surgery coming up.  Our number is [phone] if you have any questions or problems.

## 2012-12-08 ENCOUNTER — Other Ambulatory Visit: Payer: Self-pay | Admitting: *Deleted

## 2012-12-09 ENCOUNTER — Inpatient Hospital Stay (HOSPITAL_COMMUNITY)
Admission: AD | Admit: 2012-12-09 | Discharge: 2012-12-10 | DRG: 864 | Disposition: A | Discharge status: Home / Self Care | Payer: Medicaid Other | Source: Ambulatory Visit | Attending: Vascular Surgery | Admitting: Vascular Surgery

## 2012-12-09 ENCOUNTER — Encounter (HOSPITAL_COMMUNITY)
Admission: RE | Admit: 2012-12-09 | Discharge: 2012-12-09 | Disposition: A | Discharge status: Home / Self Care | Payer: Medicaid Other | Source: Ambulatory Visit | Attending: Vascular Surgery | Admitting: Vascular Surgery

## 2012-12-09 ENCOUNTER — Encounter (HOSPITAL_COMMUNITY): Payer: Self-pay

## 2012-12-09 ENCOUNTER — Ambulatory Visit (HOSPITAL_COMMUNITY)
Admission: RE | Admit: 2012-12-09 | Discharge: 2012-12-09 | Disposition: A | Discharge status: Home / Self Care | Payer: Medicaid Other | Source: Ambulatory Visit | Attending: Physician Assistant | Admitting: Physician Assistant

## 2012-12-09 DIAGNOSIS — R911 Solitary pulmonary nodule: Secondary | ICD-10-CM | POA: Diagnosis present

## 2012-12-09 DIAGNOSIS — Z01818 Encounter for other preprocedural examination: Secondary | ICD-10-CM | POA: Insufficient documentation

## 2012-12-09 DIAGNOSIS — Z79899 Other long term (current) drug therapy: Secondary | ICD-10-CM

## 2012-12-09 DIAGNOSIS — R209 Unspecified disturbances of skin sensation: Secondary | ICD-10-CM | POA: Diagnosis present

## 2012-12-09 DIAGNOSIS — E1169 Type 2 diabetes mellitus with other specified complication: Secondary | ICD-10-CM | POA: Diagnosis present

## 2012-12-09 DIAGNOSIS — R509 Fever, unspecified: Principal | ICD-10-CM | POA: Diagnosis present

## 2012-12-09 DIAGNOSIS — L98499 Non-pressure chronic ulcer of skin of other sites with unspecified severity: Secondary | ICD-10-CM | POA: Diagnosis present

## 2012-12-09 DIAGNOSIS — I739 Peripheral vascular disease, unspecified: Secondary | ICD-10-CM | POA: Diagnosis present

## 2012-12-09 DIAGNOSIS — Z01812 Encounter for preprocedural laboratory examination: Secondary | ICD-10-CM | POA: Insufficient documentation

## 2012-12-09 DIAGNOSIS — B351 Tinea unguium: Secondary | ICD-10-CM | POA: Diagnosis present

## 2012-12-09 DIAGNOSIS — E7800 Pure hypercholesterolemia, unspecified: Secondary | ICD-10-CM | POA: Diagnosis present

## 2012-12-09 DIAGNOSIS — I2510 Atherosclerotic heart disease of native coronary artery without angina pectoris: Secondary | ICD-10-CM | POA: Diagnosis present

## 2012-12-09 DIAGNOSIS — F17200 Nicotine dependence, unspecified, uncomplicated: Secondary | ICD-10-CM | POA: Diagnosis present

## 2012-12-09 DIAGNOSIS — I70209 Unspecified atherosclerosis of native arteries of extremities, unspecified extremity: Secondary | ICD-10-CM | POA: Diagnosis present

## 2012-12-09 DIAGNOSIS — L97409 Non-pressure chronic ulcer of unspecified heel and midfoot with unspecified severity: Secondary | ICD-10-CM | POA: Diagnosis present

## 2012-12-09 DIAGNOSIS — M1990 Unspecified osteoarthritis, unspecified site: Secondary | ICD-10-CM | POA: Diagnosis present

## 2012-12-09 DIAGNOSIS — I129 Hypertensive chronic kidney disease with stage 1 through stage 4 chronic kidney disease, or unspecified chronic kidney disease: Secondary | ICD-10-CM | POA: Diagnosis present

## 2012-12-09 DIAGNOSIS — Z950 Presence of cardiac pacemaker: Secondary | ICD-10-CM

## 2012-12-09 DIAGNOSIS — I700 Atherosclerosis of aorta: Secondary | ICD-10-CM | POA: Insufficient documentation

## 2012-12-09 DIAGNOSIS — N189 Chronic kidney disease, unspecified: Secondary | ICD-10-CM | POA: Diagnosis present

## 2012-12-09 DIAGNOSIS — I517 Cardiomegaly: Secondary | ICD-10-CM | POA: Insufficient documentation

## 2012-12-09 LAB — COMPREHENSIVE METABOLIC PANEL WITH GFR
ALT: 6 U/L (ref 0–35)
AST: 21 U/L (ref 0–37)
Albumin: 2.9 g/dL — ABNORMAL LOW (ref 3.5–5.2)
Alkaline Phosphatase: 112 U/L (ref 39–117)
BUN: 20 mg/dL (ref 6–23)
CO2: 25 meq/L (ref 19–32)
Calcium: 9.1 mg/dL (ref 8.4–10.5)
Chloride: 94 meq/L — ABNORMAL LOW (ref 96–112)
Creatinine, Ser: 1.46 mg/dL — ABNORMAL HIGH (ref 0.50–1.10)
GFR calc Af Amer: 44 mL/min — ABNORMAL LOW
GFR calc non Af Amer: 38 mL/min — ABNORMAL LOW
Glucose, Bld: 92 mg/dL (ref 70–99)
Potassium: 3 meq/L — ABNORMAL LOW (ref 3.5–5.1)
Sodium: 135 meq/L (ref 135–145)
Total Bilirubin: 0.7 mg/dL (ref 0.3–1.2)
Total Protein: 8.2 g/dL (ref 6.0–8.3)

## 2012-12-09 LAB — COMPREHENSIVE METABOLIC PANEL
ALT: 5 U/L (ref 0–35)
AST: 19 U/L (ref 0–37)
Calcium: 9 mg/dL (ref 8.4–10.5)
Creatinine, Ser: 1.45 mg/dL — ABNORMAL HIGH (ref 0.50–1.10)
GFR calc Af Amer: 45 mL/min — ABNORMAL LOW (ref >90)
Glucose, Bld: 121 mg/dL — ABNORMAL HIGH (ref 70–99)
Sodium: 134 mEq/L — ABNORMAL LOW (ref 135–145)
Total Protein: 7.8 g/dL (ref 6.0–8.3)

## 2012-12-09 LAB — CBC
HCT: 31.4 % — ABNORMAL LOW (ref 36.0–46.0)
Hemoglobin: 10.3 g/dL — ABNORMAL LOW (ref 12.0–15.0)
MCH: 28.3 pg (ref 26.0–34.0)
MCH: 29 pg (ref 26.0–34.0)
MCHC: 32.8 g/dL (ref 30.0–36.0)
MCHC: 33.6 g/dL (ref 30.0–36.0)
MCV: 86.3 fL (ref 78.0–100.0)
MCV: 86.5 fL (ref 78.0–100.0)
Platelets: 223 K/uL (ref 150–400)
Platelets: 237 10*3/uL (ref 150–400)
RBC: 3.64 MIL/uL — ABNORMAL LOW (ref 3.87–5.11)
RDW: 15 % (ref 11.5–15.5)
WBC: 11.8 K/uL — ABNORMAL HIGH (ref 4.0–10.5)

## 2012-12-09 LAB — GLUCOSE, CAPILLARY: Glucose-Capillary: 116 mg/dL — ABNORMAL HIGH (ref 70–99)

## 2012-12-09 LAB — PROTIME-INR
INR: 1.07 (ref 0.00–1.49)
INR: 1.06 (ref 0.00–1.49)
Prothrombin Time: 13.8 s (ref 11.6–15.2)

## 2012-12-09 LAB — URINALYSIS, ROUTINE W REFLEX MICROSCOPIC

## 2012-12-09 LAB — APTT: aPTT: 37 seconds (ref 24–37)

## 2012-12-09 LAB — PREPARE RBC (CROSSMATCH)

## 2012-12-09 MED ORDER — TRAVOPROST (BAK FREE) 0.004 % OP SOLN
1.0000 [drp] | Freq: Every day | OPHTHALMIC | Status: DC
  Administered 2012-12-09: 1 [drp] via OPHTHALMIC
  Filled 2012-12-09 (×2): qty 2.5

## 2012-12-09 MED ORDER — ONDANSETRON HCL 4 MG/2ML IJ SOLN: 4.0000 mg | Freq: Four times a day (QID) | INTRAMUSCULAR | Status: DC | PRN

## 2012-12-09 MED ORDER — VANCOMYCIN HCL 10 G IV SOLR
1250.0000 mg | INTRAVENOUS | Status: DC
  Administered 2012-12-10: 1250 mg via INTRAVENOUS
  Filled 2012-12-09 (×3): qty 1250

## 2012-12-09 MED ORDER — DEXTROSE-NACL 5-0.45 % IV SOLN
INTRAVENOUS | Status: DC
  Administered 2012-12-10: 05:00:00 via INTRAVENOUS

## 2012-12-09 MED ORDER — GUAIFENESIN-DM 100-10 MG/5ML PO SYRP: 15.0000 mL | ORAL_SOLUTION | ORAL | Status: DC | PRN

## 2012-12-09 MED ORDER — POTASSIUM CHLORIDE CRYS ER 20 MEQ PO TBCR
40.0000 meq | EXTENDED_RELEASE_TABLET | Freq: Once | ORAL | Status: AC
  Administered 2012-12-09: 40 meq via ORAL
  Filled 2012-12-09: qty 2

## 2012-12-09 MED ORDER — PANTOPRAZOLE SODIUM 40 MG PO TBEC
40.0000 mg | DELAYED_RELEASE_TABLET | Freq: Every day | ORAL | Status: DC
  Administered 2012-12-09 – 2012-12-10 (×2): 40 mg via ORAL
  Filled 2012-12-09 (×2): qty 1

## 2012-12-09 MED ORDER — LISINOPRIL 10 MG PO TABS
10.0000 mg | ORAL_TABLET | Freq: Every day | ORAL | Status: DC
  Administered 2012-12-09 – 2012-12-10 (×2): 10 mg via ORAL
  Filled 2012-12-09 (×2): qty 1

## 2012-12-09 MED ORDER — MORPHINE SULFATE 2 MG/ML IJ SOLN: 2.0000 mg | INTRAMUSCULAR | Status: DC | PRN

## 2012-12-09 MED ORDER — ENOXAPARIN SODIUM 30 MG/0.3ML ~~LOC~~ SOLN
30.0000 mg | SUBCUTANEOUS | Status: DC
  Administered 2012-12-09: 30 mg via SUBCUTANEOUS
  Filled 2012-12-09 (×2): qty 0.3

## 2012-12-09 MED ORDER — FUROSEMIDE 80 MG PO TABS
80.0000 mg | ORAL_TABLET | Freq: Every day | ORAL | Status: DC
Start: 2012-12-09 — End: 2012-12-10
  Administered 2012-12-09 – 2012-12-10 (×2): 80 mg via ORAL
  Filled 2012-12-09 (×2): qty 1

## 2012-12-09 MED ORDER — HYDRALAZINE HCL 20 MG/ML IJ SOLN: 10.0000 mg | INTRAMUSCULAR | Status: DC | PRN

## 2012-12-09 MED ORDER — GABAPENTIN 300 MG PO CAPS
300.0000 mg | ORAL_CAPSULE | Freq: Three times a day (TID) | ORAL | Status: DC
  Administered 2012-12-09 – 2012-12-10 (×2): 300 mg via ORAL
  Filled 2012-12-09 (×7): qty 1

## 2012-12-09 MED ORDER — CARVEDILOL 6.25 MG PO TABS
6.2500 mg | ORAL_TABLET | Freq: Two times a day (BID) | ORAL | Status: DC
  Administered 2012-12-09 – 2012-12-10 (×2): 6.25 mg via ORAL
  Filled 2012-12-09 (×4): qty 1

## 2012-12-09 MED ORDER — LABETALOL HCL 5 MG/ML IV SOLN: 10.0000 mg | INTRAVENOUS | Status: DC | PRN

## 2012-12-09 MED ORDER — ISOSORBIDE MONONITRATE ER 60 MG PO TB24
60.0000 mg | ORAL_TABLET | Freq: Every day | ORAL | Status: DC
  Administered 2012-12-09 – 2012-12-10 (×2): 60 mg via ORAL
  Filled 2012-12-09 (×2): qty 1

## 2012-12-09 MED ORDER — ACETAMINOPHEN 325 MG PO TABS: 325.0000 mg | ORAL_TABLET | ORAL | Status: DC | PRN

## 2012-12-09 MED ORDER — DIPHENOXYLATE-ATROPINE 2.5-0.025 MG/5ML PO LIQD: 10.0000 mL | Freq: Four times a day (QID) | ORAL | Status: DC | PRN

## 2012-12-09 MED ORDER — ATORVASTATIN CALCIUM 40 MG PO TABS
40.0000 mg | ORAL_TABLET | Freq: Every day | ORAL | Status: DC
  Administered 2012-12-09: 40 mg via ORAL
  Filled 2012-12-09 (×2): qty 1

## 2012-12-09 MED ORDER — METOPROLOL TARTRATE 1 MG/ML IV SOLN: 2.0000 mg | INTRAVENOUS | Status: DC | PRN

## 2012-12-09 MED ORDER — SENNA 8.6 MG PO TABS
1.0000 | ORAL_TABLET | Freq: Two times a day (BID) | ORAL | Status: DC
Start: 2012-12-09 — End: 2012-12-10
  Administered 2012-12-09 – 2012-12-10 (×2): 8.6 mg via ORAL
  Filled 2012-12-09 (×4): qty 1

## 2012-12-09 MED ORDER — ALUM & MAG HYDROXIDE-SIMETH 200-200-20 MG/5ML PO SUSP: 15.0000 mL | ORAL | Status: DC | PRN

## 2012-12-09 MED ORDER — CIPROFLOXACIN IN D5W 400 MG/200ML IV SOLN
400.0000 mg | INTRAVENOUS | Status: DC
  Administered 2012-12-09: 400 mg via INTRAVENOUS
  Filled 2012-12-09 (×2): qty 200

## 2012-12-09 MED ORDER — OXYCODONE-ACETAMINOPHEN 5-325 MG PO TABS
1.0000 | ORAL_TABLET | ORAL | Status: DC | PRN
  Administered 2012-12-09 (×2): 2 via ORAL
  Administered 2012-12-10: 1 via ORAL
  Filled 2012-12-09 (×3): qty 2

## 2012-12-09 MED ORDER — VITAMIN D (ERGOCALCIFEROL) 1.25 MG (50000 UNIT) PO CAPS: 50000.0000 [IU] | ORAL_CAPSULE | ORAL | Status: DC

## 2012-12-09 MED ORDER — TEMAZEPAM 15 MG PO CAPS: 15.0000 mg | ORAL_CAPSULE | Freq: Every evening | ORAL | Status: DC | PRN

## 2012-12-09 MED ORDER — ALBUTEROL SULFATE HFA 108 (90 BASE) MCG/ACT IN AERS
2.0000 | INHALATION_SPRAY | Freq: Four times a day (QID) | RESPIRATORY_TRACT | Status: DC | PRN
  Filled 2012-12-09: qty 6.7

## 2012-12-09 MED ORDER — PREDNISOLONE ACETATE 1 % OP SUSP
1.0000 [drp] | Freq: Four times a day (QID) | OPHTHALMIC | Status: DC
  Administered 2012-12-09 – 2012-12-10 (×2): 1 [drp] via OPHTHALMIC
  Filled 2012-12-09 (×2): qty 1

## 2012-12-09 MED ORDER — PHENOL 1.4 % MT LIQD
1.0000 | OROMUCOSAL | Status: DC | PRN
  Filled 2012-12-09: qty 177

## 2012-12-09 MED ORDER — OXYCODONE-ACETAMINOPHEN 5-325 MG PO TABS: 1.0000 | ORAL_TABLET | ORAL | Status: DC | PRN

## 2012-12-09 MED ORDER — CIPROFLOXACIN IN D5W 400 MG/200ML IV SOLN: 400.0000 mg | Freq: Two times a day (BID) | INTRAVENOUS | Status: DC

## 2012-12-09 MED ORDER — ACETAMINOPHEN 650 MG RE SUPP: 325.0000 mg | RECTAL | Status: DC | PRN

## 2012-12-09 NOTE — Pre-Procedure Instructions (Signed)
MAHARI BOSHEARS  12/09/2012   Your procedure is scheduled on:  Thursday, March 20th   Report to [facility] at 5:30 AM.   Call this number if you have problems the morning of surgery:[phone]   Remember:   Do not eat food or drink liquids after midnight Wednesday.   Take these medicines the morning of surgery with A SIP OF WATER: Inhalers, Coreg, Imdur, Percocet,    Do not wear jewelry, make-up or nail polish.  Do not wear lotions, powders, or perfumes. You may  NOT wear deodorant.  Do not shave underarms & legs 48 hours prior to surgery.    Do not bring valuables to the hospital.  Contacts, dentures or bridgework may not be worn into surgery.   Leave suitcase in the car. After surgery it may be brought to your room.  For patients admitted to the hospital, checkout time is 11:00 AM the day of discharge.   Name and phone number of your driver:    Special Instructions: Shower using CHG 2 nights before surgery and the night before surgery.  If you shower the day of surgery use CHG.  Use special wash - you have one bottle of CHG for all showers.  You should use approximately 1/3 of the bottle for each shower.   Please read over the following fact sheets that you were given: Pain Booklet, Coughing and Deep Breathing, Blood Transfusion Information, MRSA Information and Surgical Site Infection Prevention
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
## 2012-12-09 NOTE — Progress Notes (Addendum)
Pt presented with temp of 101.7 at PAT appt.  I got a hold of Dr. Scot Dock, who came to look at patients left foot. Decision made to admit and give patient IV antibiotics.....da EKG, blood work done.....Marland Kitchen1 view xray to be done when she gets to a room.......da

## 2012-12-10 LAB — URINALYSIS, ROUTINE W REFLEX MICROSCOPIC
Glucose, UA: NEGATIVE mg/dL
Ketones, ur: NEGATIVE mg/dL
Leukocytes, UA: NEGATIVE
Nitrite: NEGATIVE
Protein, ur: 100 mg/dL — AB

## 2012-12-10 LAB — URINE MICROSCOPIC-ADD ON

## 2012-12-10 MED ORDER — CIPROFLOXACIN HCL 500 MG PO TABS: 500.0000 mg | ORAL_TABLET | Freq: Two times a day (BID) | ORAL | Status: DC

## 2012-12-10 MED ORDER — POTASSIUM CHLORIDE CRYS ER 20 MEQ PO TBCR: 20.0000 meq | EXTENDED_RELEASE_TABLET | Freq: Every day | ORAL | Status: DC

## 2012-12-10 MED ORDER — POTASSIUM CHLORIDE CRYS ER 20 MEQ PO TBCR
20.0000 meq | EXTENDED_RELEASE_TABLET | Freq: Once | ORAL | Status: AC
  Administered 2012-12-10: 20 meq via ORAL
  Filled 2012-12-10: qty 1
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
## 2012-12-10 NOTE — Progress Notes (Addendum)
[facility] [place]  Subjective  - Patient is resting comfortably.  Cultures pending from left foot wounds.  Continue antibiotics IV.  Pending possible amputation due to the extensiveness of her left foot wounds and lack of arterial runoff below the popliteal artery.  Objective 105/61 64 97.8 F (36.6 C) (Oral) 18 95% No intake or output data in the 24 hours ending 12/10/12 0728  full thickness wound on her left heel and also a wound over the fourth metatarsal. There is no significant drainage currently from these wounds.  No sensation to the left foot Skin is warm to touch  Culture no growth day 1  Assessment/Planning:  Pending wound cultures Continue antibiotics Per Dr. Nicole Cella note: Femoral to anterior tibial artery bypass grafting would have a very small chance for success and would be at high risk for thrombosis given the lack of adequate autogenous conduit. In addition, even if the bypass is successful, given the extent of the wounds, I think that the limb may not be salvageable. The situation is further complicated by her continued smoking. However, after extensive discussion the patient feels strongly that she would like to pursue revascularization despite the risks and very low chance for success. Therefore she was scheduled for a femoral to anterior tibial artery bypass grafting on 12/16/2012. She is admitted now with fever and worsening left foot wound. Will place on IV antibiotics and follow wounds. It may be best for her to reconsider primary amputation (BKA).    [name] [name], [name] [name], [place] [facility] 12/10/2012 7:28 AM --  Laboratory Lab Results:  Recent Labs  12/09/12 1226 12/09/12 1652  WBC 11.8* 11.6*  HGB 10.3* 10.1*  HCT 31.4* 30.1*  PLT 223 237   BMET  Recent Labs  12/09/12 1226 12/09/12 1652  NA 135 134*  K 3.0* 2.8*  CL 94* 93*  CO2 25 29  GLUCOSE 92 121*  BUN 20 20  CREATININE 1.46* 1.45*  CALCIUM  9.1 9.0    COAG Lab Results  Component Value Date   INR 1.06 12/09/2012   INR 1.07 12/09/2012   INR 1.16 11/03/2012   Agree with above. Fever has resolved. The wounds on the left foot are stable with no significant drainage. I have had a long discussion with the patient and also with her sister and have explained that can that I think she would best be served by primary left below-knee amputation. She seems to be more understanding of that option at this point. I am unable to do her surgery any sooner than next Thursday and therefore we'll plan on discharging her to home today on po Cipro. She will return on Thursday for elective left below the knee amputation if she is agreeable.  [name], MD, [place] [phone] 12/10/2012

## 2012-12-12 LAB — WOUND CULTURE

## 2012-12-13 ENCOUNTER — Telehealth: Payer: Self-pay

## 2012-12-13 ENCOUNTER — Telehealth: Payer: Self-pay | Admitting: *Deleted

## 2012-12-13 DIAGNOSIS — I739 Peripheral vascular disease, unspecified: Secondary | ICD-10-CM

## 2012-12-13 LAB — GLUCOSE, CAPILLARY: Glucose-Capillary: 83 mg/dL (ref 70–99)

## 2012-12-13 MED ORDER — OXYCODONE-ACETAMINOPHEN 5-325 MG PO TABS: ORAL_TABLET | ORAL | Status: DC

## 2012-12-13 NOTE — Telephone Encounter (Signed)
Will advise that the patient call her vascular surgeon as a dramatic increase in pain could be ischemic and her vascular doctor doing the amputation on 3/20 needs to be aware and can address this pain. Also will advise that we cancel her appointment on 3/19 as there is no need for this appointment.  Dr. Doug Sou

## 2012-12-13 NOTE — Telephone Encounter (Signed)
Pt informed to call surgeon per Dr Doug Sou

## 2012-12-13 NOTE — Telephone Encounter (Signed)
Pt is scheduled for a BKA on 3/20.  She is having extreme pain to that leg and is asking for pain med.  She has been taking tylenol without relief.  Not able to sleep at night. Will ask Dr Doug Sou who saw pt in clinic on 3/10

## 2012-12-13 NOTE — Telephone Encounter (Signed)
Phone call from pt.  Requesting refill on pain medication for left leg pain.  States she needs pain medication to get by until her surgery 3/20.  Rates pain in left leg at "9" on 1-10 scale.  States she didn't receive any pain medication when discharged from the hospital recently.  Discussed w/ Dr. Trula Slade.   Rec'd v.o. For Percocet 5/325 mg 1-2 tabs every 4 hrs prn/ pain; # 30, no refills.  Advised pt. that Rx will be available at front desk to be picked up.

## 2012-12-13 NOTE — Progress Notes (Signed)
Anesthesia Chart Review:  Patient is a 62 year old female scheduled for left BKA by Dr. Scot Dock on 12/16/12.  She was initially scheduled for a left femoral to anterior tibial artery bypass with only very small chance for success; however, her left foot wounds have progressed and Dr. Scot Dock is now recommending proceeding with a left BKA instead.  She was actually directly admitted overnight on 12/09/12 following presentation to PAT with fever of 101.7--felt related to her foot wounds.  She continues to smoker.  Other history includes cardiac arrest felt probably due to metabolic cause [id number] (non-obstructive CAD by cath '05),sinus node dysfunction/complete AV block s/p St[employer] Jude PPM 11/24/09, HTN, remote ETOH abuse, PVD s/p left FPBG '03 s/p revision 04/2004 and right FPBG '03, RUL PNA with sepsis '11, CKD, smoking, hypercholesterolemia, anemia, chronic DOE, headaches, GI bleed 08/2011, ischemic colitis 09/2011, OA, DM2, left eye cataract extraction 04/2012.  She receives primary care at [facility].  Her Cardiologist is Dr. Caryl Comes. She was last seen on 03/05/12. She was noted to be at 97% ventricular pacing and 95% atrial pacing, and he shortened the AV delay.   EKG on 12/09/12 showed av dual-paced rhythm, LAD, left BBB pattern, lateral T wave abnormality. Overall, I think her EKG is stable since at least 8/90/12.  Echo on 02/22/10 showed:  - Left ventricle: The cavity size was mildly dilated. There was moderate concentric hypertrophy. The estimated ejection fraction was 55%. - Mitral valve: Mild regurgitation. - Atrial septum: No defect or patent foramen ovale was identified. - Pulmonary arteries: PA peak pressure: 6mm Hg (S). - Tricuspid valve: Trivial regurgitation.   Cardiac cath on 06/17/2004 showed preserved overall left ventricular systolic function, EF [id number], no critical coronary obstructive disease (DIAG1 50-70%, CX Marginal branch 40%).   Chest x-ray report on 05/27/2012 showed no active  cardiopulmonary disease, stable dual lead left subclavian cardiac pacemaker.   Labs from 12/09/12 noted.  Cr 1.45 (Cr 1.45-2.10 since at least 08/2011).  K+ 2.8.  Glucose 121 (A1C controlled at 5.4 on 11/10/12).  WBC 11.6. H/H 10.1/30.1, improved since 10/2012. T&S already done.  She is on Lasix and KCL supplements. Repeat BMET on arrival.  I reviewed above history with Anesthesiologist Dr. Conrad [place].  If patient's follow-up labs are acceptable and no acute CV symptoms other significant changes then would anticipate she could proceed as planned.  George Hugh [facility] Stay [facility]/Anesthesiology Phone [phone] 12/13/2012 3:39 PM

## 2012-12-15 ENCOUNTER — Encounter: Payer: Medicaid Other | Admitting: Internal Medicine

## 2012-12-15 MED ORDER — VANCOMYCIN HCL IN DEXTROSE 1-5 GM/200ML-% IV SOLN
1000.0000 mg | INTRAVENOUS | Status: AC
  Administered 2012-12-16: 1000 mg via INTRAVENOUS
  Filled 2012-12-15: qty 200

## 2012-12-15 NOTE — Pre-Procedure Instructions (Deleted)
CASY NIKOLAS  12/15/2012   Your procedure is scheduled on:  December 16, 2012  Report to [place] at 7:30 AM.  Call this number if you have problems the morning of surgery: [phone]   Remember:   Do not eat food or drink liquids after midnight.   Take these medicines the morning of surgery with A SIP OF WATER: albuterol(bring with you), carvedilol(coreg), gabapentin(neurontin), isosorbide(imdur), percocet,    Do not wear jewelry, make-up or nail polish.  Do not wear lotions, powders, or perfumes. You may wear deodorant.  Do not shave 48 hours prior to surgery. Men may shave face and neck.  Do not bring valuables to the hospital.  Contacts, dentures or bridgework may not be worn into surgery.  Leave suitcase in the car. After surgery it may be brought to your room.  For patients admitted to the hospital, checkout time is 11:00 AM the day of  discharge.   Patients discharged the day of surgery will not be allowed to drive  home.  Name and phone number of your driver:   Special Instructions: Shower using CHG 2 nights before surgery and the night before surgery.  If you shower the day of surgery use CHG.  Use special wash - you have one bottle of CHG for all showers.  You should use approximately 1/3 of the bottle for each shower.   Please read over the following fact sheets that you were given: Pain Booklet, Coughing and Deep Breathing and Surgical Site Infection Prevention

## 2012-12-15 NOTE — Progress Notes (Signed)
Per pt contact ([name]) pt cut blood bank armband off, instructed her that it will be redrawn DOS. Instructed them of time change and arrival time and to follow previous instructions given at PAT for medications DOS.

## 2012-12-16 ENCOUNTER — Inpatient Hospital Stay (HOSPITAL_COMMUNITY)
Admission: RE | Admit: 2012-12-16 | Discharge: 2012-12-20 | DRG: 240 | Disposition: A | Discharge status: Rehabilitation Facility | Payer: Medicaid Other | Source: Ambulatory Visit | Attending: Vascular Surgery | Admitting: Vascular Surgery

## 2012-12-16 ENCOUNTER — Encounter (HOSPITAL_COMMUNITY): Payer: Self-pay | Admitting: Vascular Surgery

## 2012-12-16 ENCOUNTER — Inpatient Hospital Stay (HOSPITAL_COMMUNITY): Payer: Medicaid Other | Admitting: Certified Registered Nurse Anesthetist

## 2012-12-16 ENCOUNTER — Encounter (HOSPITAL_COMMUNITY)
Admission: RE | Disposition: A | Discharge status: Rehabilitation Facility | Payer: Self-pay | Source: Ambulatory Visit | Attending: Vascular Surgery

## 2012-12-16 ENCOUNTER — Encounter (HOSPITAL_COMMUNITY): Payer: Self-pay | Admitting: Surgery

## 2012-12-16 DIAGNOSIS — R197 Diarrhea, unspecified: Secondary | ICD-10-CM | POA: Diagnosis present

## 2012-12-16 DIAGNOSIS — I708 Atherosclerosis of other arteries: Secondary | ICD-10-CM | POA: Diagnosis present

## 2012-12-16 DIAGNOSIS — B958 Unspecified staphylococcus as the cause of diseases classified elsewhere: Secondary | ICD-10-CM | POA: Diagnosis present

## 2012-12-16 DIAGNOSIS — I70269 Atherosclerosis of native arteries of extremities with gangrene, unspecified extremity: Secondary | ICD-10-CM

## 2012-12-16 DIAGNOSIS — N058 Unspecified nephritic syndrome with other morphologic changes: Secondary | ICD-10-CM | POA: Diagnosis present

## 2012-12-16 DIAGNOSIS — R911 Solitary pulmonary nodule: Secondary | ICD-10-CM | POA: Diagnosis present

## 2012-12-16 DIAGNOSIS — E1149 Type 2 diabetes mellitus with other diabetic neurological complication: Secondary | ICD-10-CM | POA: Diagnosis present

## 2012-12-16 DIAGNOSIS — E1142 Type 2 diabetes mellitus with diabetic polyneuropathy: Secondary | ICD-10-CM | POA: Diagnosis present

## 2012-12-16 DIAGNOSIS — Z96649 Presence of unspecified artificial hip joint: Secondary | ICD-10-CM

## 2012-12-16 DIAGNOSIS — E7800 Pure hypercholesterolemia, unspecified: Secondary | ICD-10-CM | POA: Diagnosis present

## 2012-12-16 DIAGNOSIS — I70209 Unspecified atherosclerosis of native arteries of extremities, unspecified extremity: Secondary | ICD-10-CM | POA: Diagnosis present

## 2012-12-16 DIAGNOSIS — L97409 Non-pressure chronic ulcer of unspecified heel and midfoot with unspecified severity: Secondary | ICD-10-CM | POA: Diagnosis present

## 2012-12-16 DIAGNOSIS — D62 Acute posthemorrhagic anemia: Secondary | ICD-10-CM | POA: Diagnosis not present

## 2012-12-16 DIAGNOSIS — I2510 Atherosclerotic heart disease of native coronary artery without angina pectoris: Secondary | ICD-10-CM | POA: Diagnosis present

## 2012-12-16 DIAGNOSIS — N189 Chronic kidney disease, unspecified: Secondary | ICD-10-CM | POA: Diagnosis present

## 2012-12-16 DIAGNOSIS — E1129 Type 2 diabetes mellitus with other diabetic kidney complication: Secondary | ICD-10-CM | POA: Diagnosis present

## 2012-12-16 DIAGNOSIS — B351 Tinea unguium: Secondary | ICD-10-CM | POA: Diagnosis present

## 2012-12-16 DIAGNOSIS — I129 Hypertensive chronic kidney disease with stage 1 through stage 4 chronic kidney disease, or unspecified chronic kidney disease: Secondary | ICD-10-CM | POA: Diagnosis present

## 2012-12-16 DIAGNOSIS — F17200 Nicotine dependence, unspecified, uncomplicated: Secondary | ICD-10-CM | POA: Diagnosis present

## 2012-12-16 DIAGNOSIS — Z8674 Personal history of sudden cardiac arrest: Secondary | ICD-10-CM

## 2012-12-16 DIAGNOSIS — Z950 Presence of cardiac pacemaker: Secondary | ICD-10-CM

## 2012-12-16 DIAGNOSIS — Z79899 Other long term (current) drug therapy: Secondary | ICD-10-CM

## 2012-12-16 HISTORY — PX: AMPUTATION: SHX166

## 2012-12-16 HISTORY — PX: BELOW KNEE LEG AMPUTATION: SUR23

## 2012-12-16 LAB — SURGICAL PCR SCREEN
MRSA, PCR: NEGATIVE
Staphylococcus aureus: NEGATIVE

## 2012-12-16 LAB — BASIC METABOLIC PANEL
BUN: 21 mg/dL (ref 6–23)
CO2: 26 mEq/L (ref 19–32)
Chloride: 101 mEq/L (ref 96–112)
Creatinine, Ser: 1.74 mg/dL — ABNORMAL HIGH (ref 0.50–1.10)
GFR calc Af Amer: 36 mL/min — ABNORMAL LOW (ref >90)
Glucose, Bld: 88 mg/dL (ref 70–99)
Potassium: 3.6 mEq/L (ref 3.5–5.1)

## 2012-12-16 LAB — TYPE AND SCREEN
ABO/RH(D): O POS
Antibody Screen: NEGATIVE
Unit division: 0
Unit division: 0

## 2012-12-16 LAB — CBC
HCT: 29.5 % — ABNORMAL LOW (ref 36.0–46.0)
MCH: 28.6 pg (ref 26.0–34.0)
MCHC: 32.2 g/dL (ref 30.0–36.0)
MCV: 88.9 fL (ref 78.0–100.0)
RDW: 15.1 % (ref 11.5–15.5)

## 2012-12-16 LAB — GLUCOSE, CAPILLARY
Glucose-Capillary: 96 mg/dL (ref 70–99)
Glucose-Capillary: 139 mg/dL — ABNORMAL HIGH (ref 70–99)

## 2012-12-16 LAB — CREATININE, SERUM: GFR calc Af Amer: 33 mL/min — ABNORMAL LOW (ref >90)

## 2012-12-16 SURGERY — AMPUTATION BELOW KNEE
Anesthesia: General | Site: Knee | Laterality: Left | Wound class: Clean

## 2012-12-16 MED ORDER — ACETAMINOPHEN 325 MG PO TABS: 325.0000 mg | ORAL_TABLET | ORAL | Status: DC | PRN

## 2012-12-16 MED ORDER — SENNOSIDES-DOCUSATE SODIUM 8.6-50 MG PO TABS
1.0000 | ORAL_TABLET | Freq: Every evening | ORAL | Status: DC | PRN
  Filled 2012-12-16: qty 1

## 2012-12-16 MED ORDER — ONDANSETRON HCL 4 MG/2ML IJ SOLN: 4.0000 mg | Freq: Four times a day (QID) | INTRAMUSCULAR | Status: DC | PRN

## 2012-12-16 MED ORDER — HYDRALAZINE HCL 20 MG/ML IJ SOLN
10.0000 mg | INTRAMUSCULAR | Status: DC | PRN
  Filled 2012-12-16: qty 0.5

## 2012-12-16 MED ORDER — SODIUM CHLORIDE 0.9 % IV SOLN
INTRAVENOUS | Status: DC
  Administered 2012-12-16 (×2): via INTRAVENOUS
  Administered 2012-12-17: 1000 mL via INTRAVENOUS
  Administered 2012-12-17: 125 mL/h via INTRAVENOUS
  Administered 2012-12-18 (×2): via INTRAVENOUS
  Administered 2012-12-19: 125 mL/h via INTRAVENOUS

## 2012-12-16 MED ORDER — GABAPENTIN 300 MG PO CAPS
300.0000 mg | ORAL_CAPSULE | Freq: Three times a day (TID) | ORAL | Status: DC
  Administered 2012-12-16 – 2012-12-20 (×12): 300 mg via ORAL
  Filled 2012-12-16 (×14): qty 1

## 2012-12-16 MED ORDER — VITAMIN D (ERGOCALCIFEROL) 1.25 MG (50000 UNIT) PO CAPS
50000.0000 [IU] | ORAL_CAPSULE | ORAL | Status: DC
  Administered 2012-12-20: 50000 [IU] via ORAL
  Filled 2012-12-16: qty 1

## 2012-12-16 MED ORDER — HYDROMORPHONE HCL PF 1 MG/ML IJ SOLN
0.5000 mg | INTRAMUSCULAR | Status: DC | PRN
  Administered 2012-12-16 (×3): 1 mg via INTRAVENOUS
  Filled 2012-12-16 (×3): qty 1

## 2012-12-16 MED ORDER — PHENOL 1.4 % MT LIQD
1.0000 | OROMUCOSAL | Status: DC | PRN
  Filled 2012-12-16: qty 177

## 2012-12-16 MED ORDER — SODIUM CHLORIDE 0.9 % IV SOLN
INTRAVENOUS | Status: DC
  Administered 2012-12-16 (×2): via INTRAVENOUS

## 2012-12-16 MED ORDER — BACITRACIN ZINC 500 UNIT/GM EX OINT
TOPICAL_OINTMENT | CUTANEOUS | Status: AC
  Filled 2012-12-16: qty 15

## 2012-12-16 MED ORDER — OXYCODONE-ACETAMINOPHEN 5-325 MG PO TABS
1.0000 | ORAL_TABLET | ORAL | Status: DC | PRN
  Administered 2012-12-16 – 2012-12-17 (×3): 2 via ORAL
  Administered 2012-12-18: 1 via ORAL
  Administered 2012-12-18 – 2012-12-20 (×10): 2 via ORAL
  Filled 2012-12-16 (×14): qty 2

## 2012-12-16 MED ORDER — LIDOCAINE HCL (CARDIAC) 20 MG/ML IV SOLN
INTRAVENOUS | Status: DC | PRN
  Administered 2012-12-16: 40 mg via INTRAVENOUS

## 2012-12-16 MED ORDER — HYDROMORPHONE HCL PF 1 MG/ML IJ SOLN
INTRAMUSCULAR | Status: AC
  Filled 2012-12-16: qty 1

## 2012-12-16 MED ORDER — MUPIROCIN 2 % EX OINT
TOPICAL_OINTMENT | Freq: Two times a day (BID) | CUTANEOUS | Status: DC
  Administered 2012-12-16 – 2012-12-20 (×8): via NASAL
  Filled 2012-12-16 (×2): qty 22

## 2012-12-16 MED ORDER — ALBUTEROL SULFATE HFA 108 (90 BASE) MCG/ACT IN AERS
2.0000 | INHALATION_SPRAY | Freq: Four times a day (QID) | RESPIRATORY_TRACT | Status: DC | PRN
  Filled 2012-12-16: qty 6.7

## 2012-12-16 MED ORDER — FENTANYL CITRATE 0.05 MG/ML IJ SOLN
INTRAMUSCULAR | Status: DC | PRN
  Administered 2012-12-16 (×2): 50 ug via INTRAVENOUS
  Administered 2012-12-16: 100 ug via INTRAVENOUS

## 2012-12-16 MED ORDER — PROPOFOL 10 MG/ML IV BOLUS
INTRAVENOUS | Status: DC | PRN
  Administered 2012-12-16: 200 mg via INTRAVENOUS

## 2012-12-16 MED ORDER — PHENYLEPHRINE HCL 10 MG/ML IJ SOLN
INTRAMUSCULAR | Status: DC | PRN
  Administered 2012-12-16: 80 ug via INTRAVENOUS
  Administered 2012-12-16: 40 ug via INTRAVENOUS
  Administered 2012-12-16: 80 ug via INTRAVENOUS

## 2012-12-16 MED ORDER — ALUM & MAG HYDROXIDE-SIMETH 200-200-20 MG/5ML PO SUSP: 15.0000 mL | ORAL | Status: DC | PRN

## 2012-12-16 MED ORDER — ONDANSETRON HCL 4 MG/2ML IJ SOLN: 4.0000 mg | Freq: Once | INTRAMUSCULAR | Status: DC | PRN

## 2012-12-16 MED ORDER — PREDNISOLONE ACETATE 1 % OP SUSP
1.0000 [drp] | Freq: Four times a day (QID) | OPHTHALMIC | Status: DC
  Filled 2012-12-16: qty 1

## 2012-12-16 MED ORDER — PROTAMINE SULFATE 10 MG/ML IV SOLN
INTRAVENOUS | Status: DC | PRN
  Administered 2012-12-16: 30 mg via INTRAVENOUS

## 2012-12-16 MED ORDER — LISINOPRIL 10 MG PO TABS
10.0000 mg | ORAL_TABLET | Freq: Every day | ORAL | Status: DC
  Administered 2012-12-16 – 2012-12-20 (×5): 10 mg via ORAL
  Filled 2012-12-16 (×5): qty 1

## 2012-12-16 MED ORDER — ENOXAPARIN SODIUM 40 MG/0.4ML ~~LOC~~ SOLN
40.0000 mg | SUBCUTANEOUS | Status: DC
  Administered 2012-12-17 – 2012-12-20 (×4): 40 mg via SUBCUTANEOUS
  Filled 2012-12-16 (×6): qty 0.4

## 2012-12-16 MED ORDER — SUCCINYLCHOLINE CHLORIDE 20 MG/ML IJ SOLN
INTRAMUSCULAR | Status: DC | PRN
  Administered 2012-12-16: 100 mg via INTRAVENOUS

## 2012-12-16 MED ORDER — ATORVASTATIN CALCIUM 20 MG PO TABS
20.0000 mg | ORAL_TABLET | Freq: Every day | ORAL | Status: DC
  Administered 2012-12-16 – 2012-12-19 (×4): 20 mg via ORAL
  Filled 2012-12-16 (×5): qty 1

## 2012-12-16 MED ORDER — MAGNESIUM SULFATE 40 MG/ML IJ SOLN
2.0000 g | Freq: Once | INTRAMUSCULAR | Status: AC | PRN
Start: 2012-12-16 — End: 2012-12-16
  Filled 2012-12-16: qty 50

## 2012-12-16 MED ORDER — FUROSEMIDE 80 MG PO TABS
80.0000 mg | ORAL_TABLET | Freq: Every day | ORAL | Status: DC
  Administered 2012-12-16 – 2012-12-20 (×5): 80 mg via ORAL
  Filled 2012-12-16 (×6): qty 1

## 2012-12-16 MED ORDER — PANTOPRAZOLE SODIUM 40 MG PO TBEC
40.0000 mg | DELAYED_RELEASE_TABLET | Freq: Every day | ORAL | Status: DC
  Administered 2012-12-16 – 2012-12-20 (×5): 40 mg via ORAL
  Filled 2012-12-16 (×5): qty 1

## 2012-12-16 MED ORDER — 0.9 % SODIUM CHLORIDE (POUR BTL) OPTIME
TOPICAL | Status: DC | PRN
  Administered 2012-12-16: 1000 mL

## 2012-12-16 MED ORDER — ACETAMINOPHEN 650 MG RE SUPP: 325.0000 mg | RECTAL | Status: DC | PRN

## 2012-12-16 MED ORDER — TRAVOPROST (BAK FREE) 0.004 % OP SOLN
1.0000 [drp] | Freq: Every day | OPHTHALMIC | Status: DC
  Administered 2012-12-16 – 2012-12-19 (×4): 1 [drp] via OPHTHALMIC
  Filled 2012-12-16: qty 2.5

## 2012-12-16 MED ORDER — ARTIFICIAL TEARS OP OINT
TOPICAL_OINTMENT | OPHTHALMIC | Status: DC | PRN
  Administered 2012-12-16: 1 via OPHTHALMIC

## 2012-12-16 MED ORDER — MIDAZOLAM HCL 5 MG/5ML IJ SOLN
INTRAMUSCULAR | Status: DC | PRN
  Administered 2012-12-16: 2 mg via INTRAVENOUS

## 2012-12-16 MED ORDER — PREDNISOLONE ACETATE 1 % OP SUSP
1.0000 [drp] | Freq: Four times a day (QID) | OPHTHALMIC | Status: DC
  Administered 2012-12-16 – 2012-12-20 (×13): 1 [drp] via OPHTHALMIC
  Filled 2012-12-16: qty 5

## 2012-12-16 MED ORDER — BISACODYL 10 MG RE SUPP: 10.0000 mg | Freq: Every day | RECTAL | Status: DC | PRN

## 2012-12-16 MED ORDER — HEPARIN SODIUM (PORCINE) 1000 UNIT/ML IJ SOLN
INTRAMUSCULAR | Status: DC | PRN
  Administered 2012-12-16: 4000 [IU] via INTRAVENOUS

## 2012-12-16 MED ORDER — MUPIROCIN 2 % EX OINT
TOPICAL_OINTMENT | CUTANEOUS | Status: AC
  Administered 2012-12-16: 22:00:00
  Filled 2012-12-16: qty 22

## 2012-12-16 MED ORDER — HYDROMORPHONE HCL PF 1 MG/ML IJ SOLN
0.2500 mg | INTRAMUSCULAR | Status: DC | PRN
  Administered 2012-12-16 (×2): 0.5 mg via INTRAVENOUS

## 2012-12-16 MED ORDER — BACITRACIN ZINC 500 UNIT/GM EX OINT
TOPICAL_OINTMENT | CUTANEOUS | Status: DC | PRN
  Administered 2012-12-16: 1 via TOPICAL

## 2012-12-16 MED ORDER — ISOSORBIDE MONONITRATE ER 60 MG PO TB24
60.0000 mg | ORAL_TABLET | Freq: Every day | ORAL | Status: DC
  Administered 2012-12-16 – 2012-12-20 (×5): 60 mg via ORAL
  Filled 2012-12-16 (×5): qty 1

## 2012-12-16 MED ORDER — DOCUSATE SODIUM 100 MG PO CAPS
100.0000 mg | ORAL_CAPSULE | Freq: Every day | ORAL | Status: DC
  Administered 2012-12-17 – 2012-12-19 (×2): 100 mg via ORAL
  Filled 2012-12-16 (×4): qty 1

## 2012-12-16 MED ORDER — LABETALOL HCL 5 MG/ML IV SOLN
10.0000 mg | INTRAVENOUS | Status: DC | PRN
  Filled 2012-12-16: qty 4

## 2012-12-16 MED ORDER — METOPROLOL TARTRATE 1 MG/ML IV SOLN: 2.0000 mg | INTRAVENOUS | Status: DC | PRN

## 2012-12-16 MED ORDER — LACTATED RINGERS IV SOLN: INTRAVENOUS | Status: DC | PRN

## 2012-12-16 MED ORDER — VANCOMYCIN HCL IN DEXTROSE 1-5 GM/200ML-% IV SOLN
1000.0000 mg | INTRAVENOUS | Status: AC
  Administered 2012-12-17 – 2012-12-18 (×2): 1000 mg via INTRAVENOUS
  Filled 2012-12-16 (×2): qty 200

## 2012-12-16 MED ORDER — GUAIFENESIN-DM 100-10 MG/5ML PO SYRP: 15.0000 mL | ORAL_SOLUTION | ORAL | Status: DC | PRN

## 2012-12-16 MED ORDER — ACETAMINOPHEN 10 MG/ML IV SOLN: 1000.0000 mg | Freq: Once | INTRAVENOUS | Status: DC | PRN

## 2012-12-16 MED ORDER — POTASSIUM CHLORIDE CRYS ER 20 MEQ PO TBCR: 20.0000 meq | EXTENDED_RELEASE_TABLET | Freq: Once | ORAL | Status: AC | PRN

## 2012-12-16 MED ORDER — POTASSIUM CHLORIDE CRYS ER 20 MEQ PO TBCR
20.0000 meq | EXTENDED_RELEASE_TABLET | Freq: Every day | ORAL | Status: DC
  Administered 2012-12-16 – 2012-12-20 (×5): 20 meq via ORAL
  Filled 2012-12-16 (×5): qty 1

## 2012-12-16 MED ORDER — CARVEDILOL 6.25 MG PO TABS
6.2500 mg | ORAL_TABLET | Freq: Two times a day (BID) | ORAL | Status: DC
  Administered 2012-12-16 – 2012-12-20 (×8): 6.25 mg via ORAL
  Filled 2012-12-16 (×10): qty 1

## 2012-12-16 SURGICAL SUPPLY — 61 items
BANDAGE ELASTIC 4 VELCRO ST LF (GAUZE/BANDAGES/DRESSINGS) ×2 IMPLANT
BANDAGE ELASTIC 6 VELCRO ST LF (GAUZE/BANDAGES/DRESSINGS) ×2 IMPLANT
BANDAGE ESMARK 6X9 LF (GAUZE/BANDAGES/DRESSINGS) ×1 IMPLANT
BANDAGE GAUZE ELAST BULKY 4 IN (GAUZE/BANDAGES/DRESSINGS) ×2 IMPLANT
BLADE SAW RECIP 87.9 MT (BLADE) ×2 IMPLANT
BNDG CMPR 9X6 STRL LF SNTH (GAUZE/BANDAGES/DRESSINGS) ×1
BNDG COHESIVE 6X5 TAN STRL LF (GAUZE/BANDAGES/DRESSINGS) ×2 IMPLANT
BNDG ESMARK 6X9 LF (GAUZE/BANDAGES/DRESSINGS) ×2
CANISTER SUCTION 2500CC (MISCELLANEOUS) ×2 IMPLANT
CLIP TI MEDIUM 6 (CLIP) IMPLANT
CLOTH BEACON ORANGE TIMEOUT ST (SAFETY) ×2 IMPLANT
COVER SURGICAL LIGHT HANDLE (MISCELLANEOUS) ×2 IMPLANT
CUFF TOURNIQUET SINGLE 24IN (TOURNIQUET CUFF) IMPLANT
CUFF TOURNIQUET SINGLE 34IN LL (TOURNIQUET CUFF) IMPLANT
CUFF TOURNIQUET SINGLE 44IN (TOURNIQUET CUFF) IMPLANT
DRAIN CHANNEL 19F RND (DRAIN) IMPLANT
DRAPE ORTHO SPLIT 77X108 STRL (DRAPES) ×4
DRAPE PROXIMA HALF (DRAPES) ×2 IMPLANT
DRAPE SURG ORHT 6 SPLT 77X108 (DRAPES) ×2 IMPLANT
DRAPE U-SHAPE 47X51 STRL (DRAPES) ×2 IMPLANT
DRSG ADAPTIC 3X8 NADH LF (GAUZE/BANDAGES/DRESSINGS) ×2 IMPLANT
ELECT REM PT RETURN 9FT ADLT (ELECTROSURGICAL) ×2
ELECTRODE REM PT RTRN 9FT ADLT (ELECTROSURGICAL) ×1 IMPLANT
EVACUATOR SILICONE 100CC (DRAIN) IMPLANT
GLOVE BIO SURGEON STRL SZ7.5 (GLOVE) ×2 IMPLANT
GLOVE BIOGEL PI IND STRL 6.5 (GLOVE) ×2 IMPLANT
GLOVE BIOGEL PI IND STRL 7.0 (GLOVE) ×1 IMPLANT
GLOVE BIOGEL PI IND STRL 7.5 (GLOVE) ×1 IMPLANT
GLOVE BIOGEL PI IND STRL 8 (GLOVE) ×1 IMPLANT
GLOVE BIOGEL PI INDICATOR 6.5 (GLOVE) ×2
GLOVE BIOGEL PI INDICATOR 7.0 (GLOVE) ×1
GLOVE BIOGEL PI INDICATOR 7.5 (GLOVE) ×1
GLOVE BIOGEL PI INDICATOR 8 (GLOVE) ×1
GLOVE SS BIOGEL STRL SZ 7 (GLOVE) ×1 IMPLANT
GLOVE SUPERSENSE BIOGEL SZ 7 (GLOVE) ×1
GLOVE SURG SS PI 6.5 STRL IVOR (GLOVE) ×2 IMPLANT
GLOVE SURG SS PI 7.0 STRL IVOR (GLOVE) ×2 IMPLANT
GOWN PREVENTION PLUS XXLARGE (GOWN DISPOSABLE) ×2 IMPLANT
GOWN STRL NON-REIN LRG LVL3 (GOWN DISPOSABLE) ×4 IMPLANT
KIT BASIN OR (CUSTOM PROCEDURE TRAY) ×2 IMPLANT
KIT ROOM TURNOVER OR (KITS) ×2 IMPLANT
NS IRRIG 1000ML POUR BTL (IV SOLUTION) ×2 IMPLANT
PACK GENERAL/GYN (CUSTOM PROCEDURE TRAY) ×2 IMPLANT
PAD ARMBOARD 7.5X6 YLW CONV (MISCELLANEOUS) ×4 IMPLANT
PADDING CAST COTTON 6X4 STRL (CAST SUPPLIES) ×2 IMPLANT
SPONGE GAUZE 4X4 12PLY (GAUZE/BANDAGES/DRESSINGS) ×2 IMPLANT
SPONGE LAP 18X18 X RAY DECT (DISPOSABLE) ×2 IMPLANT
STAPLER VISISTAT 35W (STAPLE) ×2 IMPLANT
STOCKINETTE IMPERVIOUS LG (DRAPES) ×2 IMPLANT
SUT ETHILON 3 0 PS 1 (SUTURE) IMPLANT
SUT SILK 0 TIES 10X30 (SUTURE) ×2 IMPLANT
SUT SILK 2 0 (SUTURE) ×4
SUT SILK 2 0 SH CR/8 (SUTURE) ×4 IMPLANT
SUT SILK 2-0 18XBRD TIE 12 (SUTURE) ×2 IMPLANT
SUT SILK 3 0 (SUTURE) ×2
SUT SILK 3-0 18XBRD TIE 12 (SUTURE) ×1 IMPLANT
SUT VIC AB 2-0 CT1 18 (SUTURE) ×2 IMPLANT
TOWEL OR 17X24 6PK STRL BLUE (TOWEL DISPOSABLE) ×2 IMPLANT
TOWEL OR 17X26 10 PK STRL BLUE (TOWEL DISPOSABLE) ×2 IMPLANT
UNDERPAD 30X30 INCONTINENT (UNDERPADS AND DIAPERS) ×2 IMPLANT
WATER STERILE IRR 1000ML POUR (IV SOLUTION) ×2 IMPLANT

## 2012-12-16 NOTE — Op Note (Signed)
NAME: [name] [name]   MRN: [id number] DOB: Jun 12, 1951    DATE OF OPERATION: 12/16/2012  PREOP DIAGNOSIS: Gangrene of left foot   POSTOP DIAGNOSIS: Left BKA  PROCEDURE: Same  SURGEON: Judeth Cornfield. Scot Dock, MD, FACS  ASSIST: Gerri Lins PA  ANESTHESIA: Gen.   EBL: minimal  INDICATIONS: [name] is a 62 y.o. female gangrenous left foot. She had a functioning left femoropopliteal bypass graft with severe tibial disease below that. She had extensive wounds involving the left heel and also the toe with a staph infection. Was felt that the only option was primary amputation.  FINDINGS: the tissue appeared well perfused with no signs of infection at the level of the amputation.  TECHNIQUE: The patient was brought to the operating room and received a general anesthetic. The left lower extremity was prepped and draped in usual sterile fashion. The circumference of the limb distal to the tibial tuberosity was measured and two thirds of this distance was used to mark the anterior skin flap. A long posterior flap of equal length was marked. Tourniquet had been placed on a thigh. The leg was exsanguinated with an Esmarch bandage. Even a functioning bypass graft and did administer heparin prior to inflating the tourniquet. The tourniquet was inflated to 250 mm of mercury. Under tourniquet control, the incision was carried down to the skin subcutaneous tissue and muscle to the tibia and fibula which were dissected free circumferentially. The anterior aspect of the tibia was beveled. The fibula was divided proximal to the level of the tibial division. The arteries and veins were then individually suture ligated with 2-0 silk sutures. The tourniquet was then released. Additional hemostasis was obtained using electrocautery and 2-0 silk ties. The edges of the bone were rasped. Wounds irrigated with amounts of saline. The fascial layer was closed with interrupted 2-0 Vicryl's. The skin was close with  staples. Sterile dressing was applied. The patient tolerated the procedure well and was transported to the recovery room in stable condition. All needle and sponge counts were correct.  [name], MD, FACS [facility]  DATE OF DICTATION:   12/16/2012

## 2012-12-16 NOTE — Interval H&P Note (Signed)
History and Physical Interval Note:  12/16/2012 10:20 AM  [name] [name]  has presented today for surgery, with the diagnosis of Peripheral Vascular Disease with nonhealing ulcer left foot  The various methods of treatment have been discussed with the patient and family. After consideration of risks, benefits and other options for treatment, the patient has consented to  Procedure(s): AMPUTATION BELOW KNEE (Left) as a surgical intervention .  The patient's history has been reviewed, patient examined, no change in status, stable for surgery.  I have reviewed the patient's chart and labs.  Questions were answered to the patient's satisfaction.     [name] S

## 2012-12-16 NOTE — Progress Notes (Signed)
Debbie, RN called Dr. Scot Dock in reference to consent. Dr. Scot Dock stated patient was having a left below the knee amputation and the reason was for gangrene left foot. Will enter orders into EPIC.

## 2012-12-16 NOTE — Preoperative (Signed)
Beta Blockers   Reason not to administer Beta Blockers:Not Applicable Took am Coreg

## 2012-12-16 NOTE — Progress Notes (Signed)
Nurse called Dr. Linna Caprice and informed him that patients blood sugar is 75 and that patient is asymptomatic. Dr. Linna Caprice stated that it was ok to send patient to holding area.
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
## 2012-12-16 NOTE — Anesthesia Postprocedure Evaluation (Signed)
  Anesthesia Post-op Note  Patient: [name] [name]  Procedure(s) Performed: Procedure(s): AMPUTATION BELOW KNEE (Left)  Patient Location: PACU  Anesthesia Type:General  Level of Consciousness: awake, alert  and oriented  Airway and Oxygen Therapy: Patient Spontanous Breathing and Patient connected to nasal cannula oxygen  Post-op Pain: mild  Post-op Assessment: Post-op Vital signs reviewed, Patient's Cardiovascular Status Stable, Respiratory Function Stable, Patent Airway and Pain level controlled  Post-op Vital Signs: stable  Complications: No apparent anesthesia complications

## 2012-12-16 NOTE — Anesthesia Preprocedure Evaluation (Addendum)
Anesthesia Evaluation  Patient identified by MRN, date of birth, ID band Patient awake    Reviewed: Allergy & Precautions, H&P , NPO status , Patient's Chart, lab work & pertinent test results  Airway Mallampati: II      Dental  (+) Edentulous Upper and Edentulous Lower   Pulmonary  breath sounds clear to auscultation        Cardiovascular Rhythm:Regular Rate:Normal     Neuro/Psych    GI/Hepatic   Endo/Other    Renal/GU      Musculoskeletal   Abdominal   Peds  Hematology   Anesthesia Other Findings   Reproductive/Obstetrics                           Anesthesia Physical Anesthesia Plan  ASA: III  Anesthesia Plan: General   Post-op Pain Management:    Induction: Intravenous  Airway Management Planned: LMA  Additional Equipment:   Intra-op Plan:   Post-operative Plan: Extubation in OR  Informed Consent: I have reviewed the patients History and Physical, chart, labs and discussed the procedure including the risks, benefits and alternatives for the proposed anesthesia with the patient or authorized representative who has indicated his/her understanding and acceptance.     Plan Discussed with: CRNA and Surgeon  Anesthesia Plan Comments: (PVD nonhealing L. Foot ulcer failed revascularization  Htn Smoker Sinus node dysfunction/CHB largely pacer dependent Renal insuff Cr. 1.74  Plan GA with LMA  Roberts Gaudy, MD  Roberts Gaudy )       Anesthesia Quick Evaluation

## 2012-12-16 NOTE — Progress Notes (Signed)
Orthopedic Tech Progress Note Patient Details:  [name] [name] 10-29-1950 [id number] [employer] called to place brace order. Spoke with [name]. She stated a rep will try to get to patient this afternoon but may not be able to fit until tomorrow morning since patient just had surgery today.  Patient ID: [name] [name], female   DOB: 12/17/1950, 62 y.o.   MRN: [id number]   Fenton Foy 12/16/2012, 2:07 PM

## 2012-12-16 NOTE — Transfer of Care (Signed)
Immediate Anesthesia Transfer of Care Note  Patient: [name] [name]  Procedure(s) Performed: Procedure(s): AMPUTATION BELOW KNEE (Left)  Patient Location: PACU  Anesthesia Type:General  Level of Consciousness: awake and patient cooperative  Airway & Oxygen Therapy: Patient Spontanous Breathing and Patient connected to face mask oxygen  Post-op Assessment: Report given to PACU RN, Post -op Vital signs reviewed and stable and Patient moving all extremities X 4  Post vital signs: Reviewed and stable  Complications: No apparent anesthesia complications

## 2012-12-16 NOTE — Plan of Care (Signed)
Problem: Phase I Progression Outcomes Goal: Initial discharge plan identified Outcome: Completed/Met Date Met:  12/16/12 Patient is going to [facility]  Problem: Phase II Progression Outcomes Goal: Discharge plan established Outcome: Completed/Met Date Met:  12/16/12 In patient rehab

## 2012-12-16 NOTE — Progress Notes (Signed)
      [facility]           [facility] [place]

## 2012-12-17 ENCOUNTER — Other Ambulatory Visit: Payer: Self-pay | Admitting: Internal Medicine

## 2012-12-17 ENCOUNTER — Encounter (HOSPITAL_COMMUNITY): Payer: Self-pay | Admitting: Vascular Surgery

## 2012-12-17 ENCOUNTER — Other Ambulatory Visit (INDEPENDENT_AMBULATORY_CARE_PROVIDER_SITE_OTHER): Payer: Medicaid Other

## 2012-12-17 DIAGNOSIS — S88119A Complete traumatic amputation at level between knee and ankle, unspecified lower leg, initial encounter: Secondary | ICD-10-CM

## 2012-12-17 DIAGNOSIS — I739 Peripheral vascular disease, unspecified: Secondary | ICD-10-CM

## 2012-12-17 DIAGNOSIS — L98499 Non-pressure chronic ulcer of skin of other sites with unspecified severity: Secondary | ICD-10-CM

## 2012-12-17 DIAGNOSIS — D62 Acute posthemorrhagic anemia: Secondary | ICD-10-CM

## 2012-12-17 LAB — BASIC METABOLIC PANEL
CO2: 25 mEq/L (ref 19–32)
Chloride: 103 mEq/L (ref 96–112)
Glucose, Bld: 109 mg/dL — ABNORMAL HIGH (ref 70–99)
Potassium: 3.3 mEq/L — ABNORMAL LOW (ref 3.5–5.1)
Sodium: 140 mEq/L (ref 135–145)

## 2012-12-17 LAB — CBC
Hemoglobin: 8 g/dL — ABNORMAL LOW (ref 12.0–15.0)
MCH: 28.5 pg (ref 26.0–34.0)
MCV: 86.8 fL (ref 78.0–100.0)
RBC: 2.81 MIL/uL — ABNORMAL LOW (ref 3.87–5.11)

## 2012-12-17 LAB — GLUCOSE, CAPILLARY
Glucose-Capillary: 109 mg/dL — ABNORMAL HIGH (ref 70–99)
Glucose-Capillary: 143 mg/dL — ABNORMAL HIGH (ref 70–99)

## 2012-12-17 LAB — POC HEMOCCULT BLD/STL (HOME/3-CARD/SCREEN): Fecal Occult Blood, POC: NEGATIVE

## 2012-12-17 MED ORDER — DIPHENHYDRAMINE HCL 50 MG/ML IJ SOLN
12.5000 mg | Freq: Four times a day (QID) | INTRAMUSCULAR | Status: DC | PRN
  Administered 2012-12-19: 12.5 mg via INTRAVENOUS
  Filled 2012-12-17: qty 1

## 2012-12-17 MED ORDER — NALOXONE HCL 0.4 MG/ML IJ SOLN: 0.4000 mg | INTRAMUSCULAR | Status: DC | PRN

## 2012-12-17 MED ORDER — DIPHENHYDRAMINE HCL 12.5 MG/5ML PO ELIX
12.5000 mg | ORAL_SOLUTION | Freq: Four times a day (QID) | ORAL | Status: DC | PRN
  Filled 2012-12-17: qty 5

## 2012-12-17 MED ORDER — ONDANSETRON HCL 4 MG/2ML IJ SOLN: 4.0000 mg | Freq: Four times a day (QID) | INTRAMUSCULAR | Status: DC | PRN

## 2012-12-17 MED ORDER — SODIUM CHLORIDE 0.9 % IJ SOLN: 9.0000 mL | INTRAMUSCULAR | Status: DC | PRN

## 2012-12-17 MED ORDER — FENTANYL 10 MCG/ML IV SOLN
INTRAVENOUS | Status: DC
  Administered 2012-12-17: 15 ug via INTRAVENOUS
  Administered 2012-12-17: 30 ug via INTRAVENOUS
  Administered 2012-12-17: 09:00:00 via INTRAVENOUS
  Administered 2012-12-17: 45 ug via INTRAVENOUS
  Administered 2012-12-18: 15 ug via INTRAVENOUS
  Filled 2012-12-17: qty 50

## 2012-12-17 NOTE — Evaluation (Signed)
Occupational Therapy Evaluation Patient Details Name: [name] [name] MRN: [id number] DOB: 1951-05-23 Today's Date: 12/17/2012 Time: [id number] OT Time Calculation (min): 43 min  OT Assessment / Plan / Recommendation Clinical Impression  Pt s/p L BK A thus affecting PLOF. Will benefit from continued OT services to addrses below problem list.  Recommending CIR to further maximize independence and safety with ADLs and mobility.     OT Assessment  Patient needs continued OT Services    Follow Up Recommendations  CIR    Barriers to Discharge Decreased caregiver support does not have 24/7 assist.  Equipment Recommendations  Tub/shower bench    Recommendations for Other Services Rehab consult  Frequency  Min 2X/week    Precautions / Restrictions Precautions Precautions: Fall   Pertinent Vitals/Pain See vitals    ADL  Eating/Feeding: Performed;Independent Where Assessed - Eating/Feeding: Edge of bed Grooming: Performed;Wash/dry face;Set up Where Assessed - Grooming: Unsupported sitting Upper Body Bathing: Simulated;Set up Where Assessed - Upper Body Bathing: Unsupported sitting Lower Body Bathing: Simulated;Maximal assistance Where Assessed - Lower Body Bathing: Unsupported sitting Upper Body Dressing: Simulated;Set up Where Assessed - Upper Body Dressing: Unsupported sitting Lower Body Dressing: Performed;+1 Total assistance Where Assessed - Lower Body Dressing: Unsupported sitting Toilet Transfer: Simulated;Moderate assistance Toilet Transfer Method: Stand pivot Toilet Transfer Equipment:  (bed<>chair) Equipment Used: Gait belt;Rolling walker Transfers/Ambulation Related to ADLs: Mod assist with SPT from bed to chair. ADL Comments: Total assist to don right sock.  Pt's limb guard fell off when standing to perform transfer. Once pt in recliner, educated pt on desensitization techniques for LLE but pt very resistant/fearful to touch her L LE.  assisted pt with donning limb guard  which she tolerated very well.      OT Diagnosis: Generalized weakness;Acute pain  OT Problem List: Decreased activity tolerance;Impaired balance (sitting and/or standing);Decreased safety awareness;Decreased knowledge of use of DME or AE;Pain OT Treatment Interventions: Self-care/ADL training;DME and/or AE instruction;Therapeutic activities;Patient/family education;Balance training   OT Goals Acute Rehab OT Goals OT Goal Formulation: With patient Time For Goal Achievement: 12/31/12 Potential to Achieve Goals: Good ADL Goals Pt Will Perform Lower Body Bathing: with modified independence;Sit to stand from chair;Sit to stand from bed;with adaptive equipment ADL Goal: Lower Body Bathing - Progress: Goal set today Pt Will Perform Lower Body Dressing: with modified independence;Sit to stand from chair;Sit to stand from bed;with adaptive equipment ADL Goal: Lower Body Dressing - Progress: Goal set today Pt Will Transfer to Toilet: with modified independence;Ambulation;with DME;Comfort height toilet;3-in-1 ADL Goal: Toilet Transfer - Progress: Goal set today Additional ADL Goal #1: Pt will perform sit<>stand with mod I as precursor for functional toilet transfer. ADL Goal: Additional Goal #1 - Progress: Goal set today  Visit Information  Last OT Received On: 12/17/12 Assistance Needed: +1    Subjective Data  Subjective: "What color is my leg?"  Patient Stated Goal: to return home   Prior [place] With: Friend(s) Available Help at Discharge: Friend(s);Available PRN/intermittently Type of Home: Apartment Home Access: Level entry Home Layout: One level Bathroom Shower/Tub: Chiropodist: Standard Home Adaptive Equipment: Environmental consultant - four wheeled;Walker - rolling;Bedside commode/3-in-1;Shower chair with back;Wheelchair - manual Additional Comments: w/c does not fit in hallway or pt's bathroom. Prior Function Level of Independence: Needs  assistance Needs Assistance: Light Housekeeping;Meal Prep Meal Prep: Moderate Light Housekeeping: Moderate Driving: Yes Vocation: On disability Comments: Pt reports she has not been using cane or RW in house but uses  DME (cane vs RW depending how she feels) when she is in community. Pt has an aide who comes in daily to assist with meals and housekeeping.   Communication Communication: No difficulties         Vision/Perception     Cognition  Cognition Overall Cognitive Status: Impaired Area of Impairment: Awareness of deficits;Safety/judgement Arousal/Alertness: Awake/alert Orientation Level: Appears intact for tasks assessed Behavior During Session: [facility] for tasks performed Safety/Judgement: Decreased awareness of need for assistance Safety/Judgement - Other Comments: Pt reports attempting to get out of bed earlier today without assistance.  Pt mentioning several times that she will be fine at home alone because she has a walker to do everything for her. Awareness of Deficits: Does not seem to fully understand impact of L BKA on overall function and mobility.  After perofrming transfer, pt seemed to have better understanding of what she is capable of doing. Cognition - Other Comments: Pt gets easily sidetracked during conversation.      Extremity/Trunk Assessment Right Upper Extremity Assessment RUE ROM/Strength/Tone: [facility] for tasks assessed Left Upper Extremity Assessment LUE ROM/Strength/Tone: WFL for tasks assessed     Mobility Bed Mobility Bed Mobility: Supine to Sit;Sitting - Scoot to Edge of Bed;Sit to Supine Supine to Sit: 5: Supervision;HOB elevated;With rails Sitting - Scoot to Edge of Bed: 5: Supervision Transfers Transfers: Sit to Stand;Stand to Sit Sit to Stand: 3: Mod assist;From bed;With upper extremity assist Stand to Sit: 3: Mod assist;To chair/3-in-1;With armrests;With upper extremity assist Details for Transfer Assistance: Assist for power up from bed and  for steadying while transitioning Left hand from bed to RW.  ssist to control descent to chair with cues for hand placement.     Exercise     Balance Balance Balance Assessed: Yes Static Sitting Balance Static Sitting - Balance Support: Feet supported;No upper extremity supported (Right foot supported) Static Sitting - Level of Assistance: 7: Independent   End of Session OT - End of Session Equipment Utilized During Treatment: Gait belt Activity Tolerance: Patient tolerated treatment well Patient left: in chair;with call bell/phone within reach;with family/visitor present Nurse Communication: Mobility status  GO    12/17/2012 Darrol Jump OTR/L Pager [phone] Office [phone]   Darrol Jump 12/17/2012, 11:48 AM

## 2012-12-17 NOTE — Progress Notes (Signed)
I await further therapy evals and progress to assist in determining rehab venue options. [id number]

## 2012-12-17 NOTE — Plan of Care (Signed)
Problem: Phase III Progression Outcomes Goal: Discharge plan remains appropriate-arrangements made Outcome: Completed/Met Date Met:  12/17/12 [facility]

## 2012-12-17 NOTE — Progress Notes (Signed)
Utilization review completed. Ion Gonnella, RN, BSN. 

## 2012-12-17 NOTE — Progress Notes (Signed)
Orthopedic Tech Progress Note Patient Details:  [name] [name] 1950/11/13 [id number]  Patient ID: [name] [name], female   DOB: 06-15-1951, 62 y.o.   MRN: [id number] Brace order completed by Warnell Forester, Esias Mory 12/17/2012, 4:58 PM
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
## 2012-12-17 NOTE — Progress Notes (Signed)
VASCULAR PROGRESS NOTE  SUBJECTIVE: Moderate discomfort at amputation site.   PHYSICAL EXAM: Filed Vitals:   12/16/12 1330 12/16/12 1338 12/16/12 1931 12/17/12 0438  BP:  157/73 155/63 129/53  Pulse:  61 60 64  Temp: 97.4 F (36.3 C)  97.5 F (36.4 C) 98.7 F (37.1 C)  TempSrc:   Oral Oral  Resp:  16 16 16   Height:      Weight:      SpO2:  100% 100% 100%   Dressing dry Knee immobilizer in place.  LABS: Lab Results  Component Value Date   WBC 10.4 12/17/2012   HGB 8.0* 12/17/2012   HCT 24.4* 12/17/2012   MCV 86.8 12/17/2012   PLT 241 12/17/2012   Lab Results  Component Value Date   CREATININE 1.80* 12/17/2012   Lab Results  Component Value Date   INR 1.06 12/09/2012   CBG (last 3)   Recent Labs  12/16/12 1631 12/16/12 2145 12/17/12 0654  GLUCAP 119* 139* 109*   ASSESSMENT AND PLAN: 1. 1 Day Post-Op s/p: Left BKA 2. Vanco for 48 hours as wound had MRSA. Then should be able to D/C isolation since pt had amp. 3. PTx/ CIR consulted. 4. Dressing change tomorrow.  Gae Gallop [id number] 12/17/2012

## 2012-12-17 NOTE — Evaluation (Signed)
Physical Therapy Evaluation Patient Details Name: [name] [name] MRN: [id number] DOB: 08-12-1951 Today's Date: 12/17/2012 Time: [id number] PT Time Calculation (min): 29 min  PT Assessment / Plan / Recommendation Clinical Impression  62 yo s/p Lt BKA with reports of arthritis in Rt hip limiting her tolerance for mobility. Pt hopes to return home with only intermittent assist. She states a w/c will not fit through her apartment and would need to be ambulatory on RW. At this time, do not foresee her being at modified independent level with a RW on d/c from hospital and will need further post-acute therapies.    PT Assessment  Patient needs continued PT services    Follow Up Recommendations  CIR    Does the patient have the potential to tolerate intense rehabilitation      Barriers to Discharge Decreased caregiver support      Equipment Recommendations  Other (comment) (TBD)    Recommendations for Other Services Rehab consult   Frequency Min 3X/week    Precautions / Restrictions Precautions Precautions: Fall   Pertinent Vitals/Pain SaO2 97-100% on RA Pain Lt BKA not rated; pt using PCA throughout and repositioned      Mobility  Transfers Transfers: Sit to Stand;Stand to Sit Sit to Stand: 4: Min assist Stand to Sit: 4: Min assist Details for Transfer Assistance: from recliner pt able to scoot to [place] without cues and properly sequence sit to stand with RW; assist for safety/balance Ambulation/Gait Ambulation/Gait Assistance: 4: Min assist Ambulation Distance (Feet): 20 Feet Assistive device: Rolling walker Ambulation/Gait Assistance Details: good use of UEs initial 10 ft to control descent onto RLE when "hopping" ; as she fatigued, she landed harder on RLE and c/o hip pain with each step    Exercises Other Exercises Other Exercises: Instructed in importance of knee extension for prosthesis use; educated re: limb guard to promote extension   PT Diagnosis: Difficulty  walking;Acute pain  PT Problem List: Decreased activity tolerance;Decreased balance;Decreased mobility;Decreased cognition;Decreased knowledge of use of DME;Pain PT Treatment Interventions: DME instruction;Gait training;Functional mobility training;Therapeutic activities;Therapeutic exercise;Cognitive remediation;Patient/family education   PT Goals Acute Rehab PT Goals PT Goal Formulation: With patient Time For Goal Achievement: 12/31/12 Potential to Achieve Goals: Good Pt will go Supine/Side to Sit: with modified independence PT Goal: Supine/Side to Sit - Progress: Goal set today Pt will go Sit to Supine/Side: with modified independence PT Goal: Sit to Supine/Side - Progress: Goal set today Pt will go Sit to Stand: with modified independence PT Goal: Sit to Stand - Progress: Goal set today Pt will go Stand to Sit: with modified independence PT Goal: Stand to Sit - Progress: Goal set today Pt will Ambulate: 16 - 50 feet;with modified independence;with rolling walker PT Goal: Ambulate - Progress: Goal set today Pt will Perform Home Exercise Program: with supervision, verbal cues required/provided PT Goal: Perform Home Exercise Program - Progress: Goal set today  Visit Information  Last PT Received On: 12/17/12 Assistance Needed: +1    Subjective Data  Subjective: I've got to lose some weight...this is too hard on my (Rt) hip" Patient Stated Goal: to get a prosthesis and walk   Prior [place] Lives With: Friend(s) Available Help at Discharge: Friend(s);Available PRN/intermittently Type of Home: Apartment Home Access: Level entry Home Layout: One level Bathroom Shower/Tub: Chiropodist: Standard Home Adaptive Equipment: Environmental consultant - four wheeled;Walker - rolling;Bedside commode/3-in-1;Shower chair with back;Wheelchair - manual Additional Comments: w/c does not fit in hallway or pt's bathroom. Prior  Function Level of Independence: Needs  assistance Needs Assistance: Light Housekeeping;Meal Prep Meal Prep: Moderate Light Housekeeping: Moderate Driving: Yes Vocation: On disability Comments: Pt reports she has not been using cane or RW in house but uses DME (cane vs RW depending how she feels) when she is in community. Pt has an aide who comes in daily to assist with meals and housekeeping.   Communication Communication: No difficulties    Cognition  Cognition Overall Cognitive Status: Impaired Area of Impairment: Memory Arousal/Alertness: Awake/alert Behavior During Session: WFL for tasks performed Memory Deficits: asked for pain pills (has PCA), when informed RN she stated pt reported pills didn't work and that's why she got the PCA Cognition - Other Comments: Pt gets easily sidetracked during conversation.      Extremity/Trunk Assessment Right Lower Extremity Assessment RLE ROM/Strength/Tone: WFL for tasks assessed RLE Sensation: History of peripheral neuropathy Left Lower Extremity Assessment LLE ROM/Strength/Tone: Deficits LLE ROM/Strength/Tone Deficits: new BKA; pt able to fully extend knee, flexed only to 20 due to pain   Balance Static Sitting Balance Static Sitting - Balance Support: No upper extremity supported;Feet supported Static Sitting - Level of Assistance: 7: Independent Static Standing Balance Static Standing - Balance Support: Bilateral upper extremity supported Static Standing - Level of Assistance: 4: Min assist  End of Session PT - End of Session Equipment Utilized During Treatment: Gait belt Activity Tolerance: Patient tolerated treatment well Patient left: in chair;with call bell/phone within reach Nurse Communication: Mobility status  GP     [name] [name] 12/17/2012, 4:57 PM  12/17/2012 Barry Brunner, PT Pager: [phone]

## 2012-12-18 ENCOUNTER — Other Ambulatory Visit: Payer: Self-pay | Admitting: Internal Medicine

## 2012-12-18 LAB — GLUCOSE, CAPILLARY
Glucose-Capillary: 92 mg/dL (ref 70–99)
Glucose-Capillary: 141 mg/dL — ABNORMAL HIGH (ref 70–99)

## 2012-12-18 LAB — CBC
MCV: 87.9 fL (ref 78.0–100.0)
Platelets: 205 10*3/uL (ref 150–400)
RBC: 2.48 MIL/uL — ABNORMAL LOW (ref 3.87–5.11)
RDW: 15.3 % (ref 11.5–15.5)
WBC: 8.9 10*3/uL (ref 4.0–10.5)

## 2012-12-18 LAB — BASIC METABOLIC PANEL
CO2: 23 mEq/L (ref 19–32)
Calcium: 8.4 mg/dL (ref 8.4–10.5)
Chloride: 108 mEq/L (ref 96–112)
GFR calc Af Amer: 35 mL/min — ABNORMAL LOW (ref >90)
Sodium: 141 mEq/L (ref 135–145)

## 2012-12-18 LAB — PREPARE RBC (CROSSMATCH)

## 2012-12-18 MED ORDER — FUROSEMIDE 10 MG/ML IJ SOLN
20.0000 mg | Freq: Once | INTRAMUSCULAR | Status: AC
  Administered 2012-12-18: 20 mg via INTRAVENOUS
  Filled 2012-12-18: qty 2

## 2012-12-18 NOTE — Progress Notes (Addendum)
[facility] Progress Note  12/18/2012 9:12 AM POD 2  Subjective:  C/o pain in left leg  Tm 99.8 VSS 100% 2LO2NC  Filed Vitals:   12/18/12 0732  BP:   Pulse:   Temp:   Resp: 18    Physical Exam: Incisions:  C/d/i with staples in tact.  There is some dry bloody drainage on the gauze.   CBC    Component Value Date/Time   WBC 8.9 12/18/2012 0442   RBC 2.48* 12/18/2012 0442   HGB 7.2* 12/18/2012 0442   HCT 21.8* 12/18/2012 0442   PLT 205 12/18/2012 0442   MCV 87.9 12/18/2012 0442   MCH 29.0 12/18/2012 0442   MCHC 33.0 12/18/2012 0442   RDW 15.3 12/18/2012 0442   LYMPHSABS 1.5 11/10/2012 1115   MONOABS 0.7 11/10/2012 1115   EOSABS 0.4 11/10/2012 1115   BASOSABS 0.0 11/10/2012 1115    BMET    Component Value Date/Time   NA 141 12/18/2012 0442   K 3.8 12/18/2012 0442   CL 108 12/18/2012 0442   CO2 23 12/18/2012 0442   GLUCOSE 105* 12/18/2012 0442   BUN 18 12/18/2012 0442   CREATININE 1.76* 12/18/2012 0442   CREATININE 1.89* 11/10/2012 1115   CALCIUM 8.4 12/18/2012 0442   CALCIUM 9.3 08/28/2011 1648   GFRNONAA 30* 12/18/2012 0442   GFRAA 35* 12/18/2012 0442    INR    Component Value Date/Time   INR 1.06 12/09/2012 1652    No intake or output data in the 24 hours ending 12/18/12 0912   Assessment/Plan:  62 y.o. female is s/p left below knee amputation  POD 2  -moderate pain discomfort-pt sleeping when entering the room then complains of pain medication not working.  Continue PCA.  Explained to pt that we cannot completely make her pain go away. -Acute surgical blood loss anemia-hgb 7.2 today down from 8.0 yesterday-will transfuse.  Pt does have hx of GIB.  Will check hemoccult. -had BM this am and RN reports no evidence of bleeding. -ck CBC in am   Leontine Locket, PA-C [facility] [phone] 12/18/2012 9:12 AM  Agree with above-we'll transfuse 2 units packed red blood cells today Stump healing nicely Pain control on PCA

## 2012-12-19 ENCOUNTER — Other Ambulatory Visit: Payer: Self-pay | Admitting: Internal Medicine

## 2012-12-19 LAB — TYPE AND SCREEN: Unit division: 0

## 2012-12-19 LAB — GLUCOSE, CAPILLARY
Glucose-Capillary: 107 mg/dL — ABNORMAL HIGH (ref 70–99)
Glucose-Capillary: 123 mg/dL — ABNORMAL HIGH (ref 70–99)

## 2012-12-19 LAB — CBC
HCT: 28 % — ABNORMAL LOW (ref 36.0–46.0)
Hemoglobin: 9.4 g/dL — ABNORMAL LOW (ref 12.0–15.0)
MCH: 29.7 pg (ref 26.0–34.0)
MCV: 88.3 fL (ref 78.0–100.0)
Platelets: 196 10*3/uL (ref 150–400)
RBC: 3.17 MIL/uL — ABNORMAL LOW (ref 3.87–5.11)

## 2012-12-19 MED ORDER — SODIUM CHLORIDE 0.9 % IJ SOLN
3.0000 mL | Freq: Two times a day (BID) | INTRAMUSCULAR | Status: DC
  Administered 2012-12-19 – 2012-12-20 (×3): 3 mL via INTRAVENOUS

## 2012-12-19 MED ORDER — SODIUM CHLORIDE 0.9 % IJ SOLN: 3.0000 mL | INTRAMUSCULAR | Status: DC | PRN

## 2012-12-19 NOTE — Progress Notes (Signed)
PCA fentanyl discontinued this am.  290 mcg. of fentanyl wasted in sink from unfinished PCA injection.  Witnessed by  Isabelle Course, RN

## 2012-12-19 NOTE — Progress Notes (Addendum)
[facility] Progress Note  12/19/2012 8:34 AM POD 3  Subjective:  Pain control much better today-wants PCA off.  RN states pt is doing well mobilizing.  Afebrile x 24 hrs VSS  100% RA  Filed Vitals:   12/19/12 0400  BP:   Pulse:   Temp:   Resp: 16    Physical Exam: Incisions:  Dressing is clean and in tact. Extremities:  Good movement of LLE  CBC    Component Value Date/Time   WBC 10.1 12/19/2012 0500   RBC 3.17* 12/19/2012 0500   HGB 9.4* 12/19/2012 0500   HCT 28.0* 12/19/2012 0500   PLT 196 12/19/2012 0500   MCV 88.3 12/19/2012 0500   MCH 29.7 12/19/2012 0500   MCHC 33.6 12/19/2012 0500   RDW 14.9 12/19/2012 0500   LYMPHSABS 1.5 11/10/2012 1115   MONOABS 0.7 11/10/2012 1115   EOSABS 0.4 11/10/2012 1115   BASOSABS 0.0 11/10/2012 1115    BMET    Component Value Date/Time   NA 141 12/18/2012 0442   K 3.8 12/18/2012 0442   CL 108 12/18/2012 0442   CO2 23 12/18/2012 0442   GLUCOSE 105* 12/18/2012 0442   BUN 18 12/18/2012 0442   CREATININE 1.76* 12/18/2012 0442   CREATININE 1.89* 11/10/2012 1115   CALCIUM 8.4 12/18/2012 0442   CALCIUM 9.3 08/28/2011 1648   GFRNONAA 30* 12/18/2012 0442   GFRAA 35* 12/18/2012 0442    INR    Component Value Date/Time   INR 1.06 12/09/2012 1652     Intake/Output Summary (Last 24 hours) at 12/19/12 0834 Last data filed at 12/18/12 2045  Gross per 24 hour  Intake 1910.5 ml  Output    500 ml  Net 1410.5 ml     Assessment/Plan:  62 y.o. female is s/p left below knee amputation  POD 3  -Hgb improved after 2U PRBC's -hemocult not done yet -continue PT -hopefully for CIR early this week.   Leontine Locket, PA-C [facility] [phone] 12/19/2012 8:34 AM   Agree with above Hopefully CIR early this week

## 2012-12-20 ENCOUNTER — Inpatient Hospital Stay (HOSPITAL_COMMUNITY)
Admission: RE | Admit: 2012-12-20 | Discharge: 2012-12-28 | DRG: 945 | Disposition: A | Discharge status: Home Health | Payer: Medicaid Other | Source: Intra-hospital | Attending: Physical Medicine & Rehabilitation | Admitting: Physical Medicine & Rehabilitation

## 2012-12-20 ENCOUNTER — Other Ambulatory Visit: Payer: Self-pay | Admitting: Internal Medicine

## 2012-12-20 DIAGNOSIS — Z5189 Encounter for other specified aftercare: Principal | ICD-10-CM

## 2012-12-20 DIAGNOSIS — D62 Acute posthemorrhagic anemia: Secondary | ICD-10-CM

## 2012-12-20 DIAGNOSIS — L98499 Non-pressure chronic ulcer of skin of other sites with unspecified severity: Secondary | ICD-10-CM

## 2012-12-20 DIAGNOSIS — S88119A Complete traumatic amputation at level between knee and ankle, unspecified lower leg, initial encounter: Secondary | ICD-10-CM

## 2012-12-20 DIAGNOSIS — E1149 Type 2 diabetes mellitus with other diabetic neurological complication: Secondary | ICD-10-CM

## 2012-12-20 DIAGNOSIS — E7800 Pure hypercholesterolemia, unspecified: Secondary | ICD-10-CM

## 2012-12-20 DIAGNOSIS — E1142 Type 2 diabetes mellitus with diabetic polyneuropathy: Secondary | ICD-10-CM

## 2012-12-20 DIAGNOSIS — I739 Peripheral vascular disease, unspecified: Secondary | ICD-10-CM

## 2012-12-20 DIAGNOSIS — N183 Chronic kidney disease, stage 3 unspecified: Secondary | ICD-10-CM

## 2012-12-20 DIAGNOSIS — Z87891 Personal history of nicotine dependence: Secondary | ICD-10-CM

## 2012-12-20 DIAGNOSIS — N058 Unspecified nephritic syndrome with other morphologic changes: Secondary | ICD-10-CM

## 2012-12-20 DIAGNOSIS — IMO0002 Reserved for concepts with insufficient information to code with codable children: Secondary | ICD-10-CM

## 2012-12-20 DIAGNOSIS — I2510 Atherosclerotic heart disease of native coronary artery without angina pectoris: Secondary | ICD-10-CM

## 2012-12-20 DIAGNOSIS — Z96649 Presence of unspecified artificial hip joint: Secondary | ICD-10-CM

## 2012-12-20 DIAGNOSIS — E1139 Type 2 diabetes mellitus with other diabetic ophthalmic complication: Secondary | ICD-10-CM

## 2012-12-20 DIAGNOSIS — I129 Hypertensive chronic kidney disease with stage 1 through stage 4 chronic kidney disease, or unspecified chronic kidney disease: Secondary | ICD-10-CM

## 2012-12-20 DIAGNOSIS — M1990 Unspecified osteoarthritis, unspecified site: Secondary | ICD-10-CM

## 2012-12-20 DIAGNOSIS — I10 Essential (primary) hypertension: Secondary | ICD-10-CM

## 2012-12-20 DIAGNOSIS — E11319 Type 2 diabetes mellitus with unspecified diabetic retinopathy without macular edema: Secondary | ICD-10-CM

## 2012-12-20 DIAGNOSIS — Z79899 Other long term (current) drug therapy: Secondary | ICD-10-CM

## 2012-12-20 DIAGNOSIS — E1129 Type 2 diabetes mellitus with other diabetic kidney complication: Secondary | ICD-10-CM

## 2012-12-20 DIAGNOSIS — Z950 Presence of cardiac pacemaker: Secondary | ICD-10-CM

## 2012-12-20 DIAGNOSIS — N189 Chronic kidney disease, unspecified: Secondary | ICD-10-CM

## 2012-12-20 LAB — CBC
MCH: 28.8 pg (ref 26.0–34.0)
MCV: 86.2 fL (ref 78.0–100.0)
Platelets: 251 10*3/uL (ref 150–400)
RDW: 14.9 % (ref 11.5–15.5)
WBC: 9.9 10*3/uL (ref 4.0–10.5)

## 2012-12-20 LAB — GLUCOSE, CAPILLARY: Glucose-Capillary: 87 mg/dL (ref 70–99)

## 2012-12-20 MED ORDER — ISOSORBIDE MONONITRATE ER 60 MG PO TB24
60.0000 mg | ORAL_TABLET | Freq: Every day | ORAL | Status: DC
  Administered 2012-12-21 – 2012-12-28 (×8): 60 mg via ORAL
  Filled 2012-12-20 (×10): qty 1

## 2012-12-20 MED ORDER — ONDANSETRON HCL 4 MG PO TABS: 4.0000 mg | ORAL_TABLET | Freq: Four times a day (QID) | ORAL | Status: DC | PRN

## 2012-12-20 MED ORDER — SENNOSIDES-DOCUSATE SODIUM 8.6-50 MG PO TABS
1.0000 | ORAL_TABLET | Freq: Every evening | ORAL | Status: DC | PRN
  Filled 2012-12-20 (×2): qty 1

## 2012-12-20 MED ORDER — TRAVOPROST (BAK FREE) 0.004 % OP SOLN
1.0000 [drp] | Freq: Every day | OPHTHALMIC | Status: DC
  Administered 2012-12-20 – 2012-12-27 (×8): 1 [drp] via OPHTHALMIC
  Filled 2012-12-20 (×2): qty 2.5

## 2012-12-20 MED ORDER — VITAMIN D (ERGOCALCIFEROL) 1.25 MG (50000 UNIT) PO CAPS
50000.0000 [IU] | ORAL_CAPSULE | ORAL | Status: DC
  Administered 2012-12-27: 50000 [IU] via ORAL
  Filled 2012-12-20: qty 1

## 2012-12-20 MED ORDER — POTASSIUM CHLORIDE CRYS ER 20 MEQ PO TBCR
20.0000 meq | EXTENDED_RELEASE_TABLET | Freq: Every day | ORAL | Status: DC
  Administered 2012-12-21 – 2012-12-27 (×7): 20 meq via ORAL
  Filled 2012-12-20 (×9): qty 1

## 2012-12-20 MED ORDER — ENOXAPARIN SODIUM 40 MG/0.4ML ~~LOC~~ SOLN
40.0000 mg | SUBCUTANEOUS | Status: DC
  Administered 2012-12-21 – 2012-12-27 (×7): 40 mg via SUBCUTANEOUS
  Filled 2012-12-20 (×8): qty 0.4

## 2012-12-20 MED ORDER — ALBUTEROL SULFATE HFA 108 (90 BASE) MCG/ACT IN AERS
2.0000 | INHALATION_SPRAY | Freq: Four times a day (QID) | RESPIRATORY_TRACT | Status: DC | PRN
  Filled 2012-12-20: qty 6.7

## 2012-12-20 MED ORDER — ATORVASTATIN CALCIUM 20 MG PO TABS
20.0000 mg | ORAL_TABLET | Freq: Every day | ORAL | Status: DC
  Administered 2012-12-20 – 2012-12-27 (×7): 20 mg via ORAL
  Filled 2012-12-20 (×9): qty 1

## 2012-12-20 MED ORDER — ENOXAPARIN SODIUM 40 MG/0.4ML ~~LOC~~ SOLN: 40.0000 mg | SUBCUTANEOUS | Status: DC

## 2012-12-20 MED ORDER — GABAPENTIN 300 MG PO CAPS
300.0000 mg | ORAL_CAPSULE | Freq: Three times a day (TID) | ORAL | Status: DC
  Administered 2012-12-20 – 2012-12-28 (×23): 300 mg via ORAL
  Filled 2012-12-20 (×27): qty 1

## 2012-12-20 MED ORDER — SORBITOL 70 % SOLN: 30.0000 mL | Freq: Every day | Status: DC | PRN

## 2012-12-20 MED ORDER — LISINOPRIL 10 MG PO TABS
10.0000 mg | ORAL_TABLET | Freq: Every day | ORAL | Status: DC
  Administered 2012-12-21 – 2012-12-26 (×6): 10 mg via ORAL
  Filled 2012-12-20 (×7): qty 1

## 2012-12-20 MED ORDER — OXYCODONE-ACETAMINOPHEN 5-325 MG PO TABS
1.0000 | ORAL_TABLET | ORAL | Status: DC | PRN
  Administered 2012-12-20 – 2012-12-27 (×23): 2 via ORAL
  Administered 2012-12-27: 1 via ORAL
  Administered 2012-12-28: 2 via ORAL
  Filled 2012-12-20 (×25): qty 2

## 2012-12-20 MED ORDER — CARVEDILOL 6.25 MG PO TABS
6.2500 mg | ORAL_TABLET | Freq: Two times a day (BID) | ORAL | Status: DC
  Administered 2012-12-20 – 2012-12-26 (×11): 6.25 mg via ORAL
  Filled 2012-12-20 (×14): qty 1

## 2012-12-20 MED ORDER — ACETAMINOPHEN 325 MG PO TABS
325.0000 mg | ORAL_TABLET | ORAL | Status: DC | PRN
  Administered 2012-12-27: 650 mg via ORAL
  Filled 2012-12-20: qty 2

## 2012-12-20 MED ORDER — FUROSEMIDE 80 MG PO TABS
80.0000 mg | ORAL_TABLET | Freq: Every day | ORAL | Status: DC
  Administered 2012-12-21 – 2012-12-27 (×7): 80 mg via ORAL
  Filled 2012-12-20 (×8): qty 1

## 2012-12-20 MED ORDER — GUAIFENESIN-DM 100-10 MG/5ML PO SYRP: 15.0000 mL | ORAL_SOLUTION | ORAL | Status: DC | PRN

## 2012-12-20 MED ORDER — ONDANSETRON HCL 4 MG/2ML IJ SOLN: 4.0000 mg | Freq: Four times a day (QID) | INTRAMUSCULAR | Status: DC | PRN

## 2012-12-20 MED ORDER — PHENOL 1.4 % MT LIQD
1.0000 | OROMUCOSAL | Status: DC | PRN
  Filled 2012-12-20: qty 177

## 2012-12-20 MED ORDER — PANTOPRAZOLE SODIUM 40 MG PO TBEC
40.0000 mg | DELAYED_RELEASE_TABLET | Freq: Every day | ORAL | Status: DC
  Administered 2012-12-21 – 2012-12-28 (×8): 40 mg via ORAL
  Filled 2012-12-20 (×8): qty 1

## 2012-12-20 NOTE — Progress Notes (Signed)
Patient admitted to 4034 from 2000 at 65. Alert and oriented x 4. Pain 4/10, "bearable", received pain medication before discharge from 2000. Patient oriented to unit, rehab schedule, call bell system, and safety plan. Amputation education packet given. L BKA residual limb clean dry intact with staples, scant sanguinous drainage. Gauze and ace wrap reinforced. Patient verbalized understanding of rehab admission educational information. Alease Medina
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
## 2012-12-20 NOTE — Progress Notes (Signed)
I met with pt and her sister at bedside. Both are in agreement to admission to IP rehab today. I will arrange. SP:[id number]
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
## 2012-12-20 NOTE — Plan of Care (Signed)
Overall Plan of Care [facility]) Patient Details Name: [name] [name] MRN: [id number] DOB: 1951-06-19  Diagnosis:  Left BKA  Co-morbidities: abla, htn, dm, cri  Functional Problem List  Patient demonstrates impairments in the following areas: Balance, Bowel, Edema, Endurance, Medication Management, Nutrition, Pain, Perception, Safety, Sensory  and Skin Integrity  Basic ADL's: grooming, bathing, dressing and toileting Advanced ADL's: n/a at this time  Transfers:  bed mobility, bed to chair, toilet, tub/shower, car and furniture Locomotion:  ambulation and wheelchair mobility  Additional Impairments:  Leisure Awareness  Anticipated Outcomes Item Anticipated Outcome  Eating/Swallowing    Basic self-care  Supervision -> mod I  Tolieting  Mod I  Bowel/Bladder  Mod independent, continent bowel/bladder, bm q2days or more frequent   Transfers  Mod I basic; S car  Locomotion  Mod I w/c level; S/steady A gait  Communication    Cognition    Pain  Pain managed with prn medications  Safety/Judgment    Other  Skin: min assist with surgical dressing and ace wrapping of residual limb, min assist with skin care to prevent breakdown   Therapy Plan: PT Intensity: Minimum of 1-2 x/day ,45 to 90 minutes PT Frequency: 5 out of 7 days PT Duration Estimated Length of Stay: 7-10 days OT Intensity: Minimum of 1-2 x/day, 45 to 90 minutes OT Frequency: 5 out of 7 days OT Duration/Estimated Length of Stay: 7-10 days      Team Interventions: Item RN PT OT SLP SW TR Other  Self Care/Advanced ADL Retraining  x x      Neuromuscular Re-Education  x x      Therapeutic Activities  x x      UE/LE Strength Training/ROM  x x      UE/LE Coordination Activities  x x      Visual/Perceptual Remediation/Compensation         DME/Adaptive Equipment Instruction  x x      Therapeutic Exercise  x x      Balance/Vestibular Training  x x      Patient/Family Education x x x      Cognitive  Remediation/Compensation         Functional Mobility Training  x x      Ambulation/Gait Training  x x      Stair Training  x       Wheelchair Propulsion/Positioning  x x      Functional Printmaker  x       Community Reintegration  x x      Dysphagia/Aspiration Environmental consultant         Bladder Management x        Bowel Management x        Disease Management/Prevention x x x      Pain Management x x x      Medication Management x        Skin Care/Wound Management x x x      Splinting/Orthotics  x x      Discharge Planning  x x      Psychosocial Support  x x                             Team Discharge Planning: Destination: PT-Home ,OT- Home , SLP-  Projected Follow-up: PT-Home health PT, OT-  Home health OT, SLP-  Projected Equipment Needs: PT- (amputee pad for LLE  on w/c), OT-  , SLP-  Patient/family involved in discharge planning: PT- Patient,  OT-Patient, SLP-   MD ELOS: 7-10 days Medical Rehab Prognosis:  Excellent Assessment: The patient has been admitted for CIR therapies. The team will be addressing, functional mobility, strength, stamina, balance, safety, adaptive techniques/equipment, self-care, bowel and bladder mgt, patient and caregiver education, pain mgt, pre-prosthetic education, home safety evaluation. Goals have been set at modified independent to supervision.      See Team Conference Notes for weekly updates to the plan of care

## 2012-12-20 NOTE — Progress Notes (Signed)
Pt transferred to [facility] rehab via wheelchair.
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
## 2012-12-20 NOTE — Progress Notes (Signed)
VASCULAR PROGRESS NOTE  SUBJECTIVE: Still with some discomfort  PHYSICAL EXAM: Filed Vitals:   12/19/12 0800 12/19/12 1440 12/19/12 2011 12/20/12 0545  BP:  137/67 138/56 159/79  Pulse:  67 64 69  Temp:  97.6 F (36.4 C) 98.1 F (36.7 C) 98.1 F (36.7 C)  TempSrc:  Oral Oral Oral  Resp: 22 18 16 16   Height:      Weight:      SpO2: 97% 97% 99% 100%   Dressing changed. Some bloody drainage from the incision.  Otherwise looks fine.   LABS: Lab Results  Component Value Date   WBC 10.1 12/19/2012   HGB 9.4* 12/19/2012   HCT 28.0* 12/19/2012   MCV 88.3 12/19/2012   PLT 196 12/19/2012   Lab Results  Component Value Date   CREATININE 1.76* 12/18/2012   Lab Results  Component Value Date   INR 1.06 12/09/2012   CBG (last 3)   Recent Labs  12/19/12 1612 12/19/12 2055 12/20/12 0550  GLUCAP 107* 123* 87    Active Problems:   * No active hospital problems. *   ASSESSMENT AND PLAN: 1. 4 Days Post-Op s/p: Left BKA 2. CIR following 3. Continue Ptx 4. Continue dressing changes.  Gae Gallop [id number] 12/20/2012

## 2012-12-20 NOTE — Progress Notes (Signed)
Pt to transfer to inpatient rehab today; report called to [place], RN; vascular PA notified; will write d/c orders; pt made aware.

## 2012-12-20 NOTE — Progress Notes (Signed)
Occupational Therapy Treatment Patient Details Name: [name] [name] MRN: [id number] DOB: 02/27/1951 Today's Date: 12/20/2012 Time: [id number] OT Time Calculation (min): 25 min  OT Assessment / Plan / Recommendation Comments on Treatment Session Pt is progressing toward OT goals for ADL's and functional mobility/transfers this pm. She will benefit from cont OT @ CIR to address deficits and maximize independence with ADL & IADL's prior to d/c home. Pt was educated to use call bell for all needs/transfers at this time & verbalized understanding of this. RN made aware as well as pt reported I transfers, but is Min A w/ vc's.    Follow Up Recommendations  CIR    Barriers to Discharge       Equipment Recommendations  Tub/shower bench    Recommendations for Other Services    Frequency Min 2X/week   Plan Discharge plan remains appropriate    Precautions / Restrictions Precautions Precautions: Fall Restrictions Weight Bearing Restrictions: No LLE Weight Bearing: Non weight bearing Other Position/Activity Restrictions: Left BKA   Pertinent Vitals/Pain 5/10 L leg. RN made aware and gave pt medication. Functional activity and pt repositioned as well.    ADL  Eating/Feeding: Performed;Independent Where Assessed - Eating/Feeding: Chair Grooming: Simulated;Minimal assistance Where Assessed - Grooming: Supported standing Toilet Transfer: Performed;Minimal Print production planner Method: Sit to Loss adjuster, chartered: Comfort height toilet;Grab bars;Other (comment) (Pt amb from chair to toilet in bathroom using RW, grab bar) Toileting - Clothing Manipulation and Hygiene: Performed;Moderate assistance;Maximal assistance Where Assessed - Toileting Clothing Manipulation and Hygiene: Standing Transfers/Ambulation Related to ADLs: Pt was Min assist sit to stand from chair and performed functional mobility into bathroom using RW & gait belt. VC's for safety & sequencing w/ RW as well as  hand placement during stand-sit and sit-stand. ADL Comments: Pt performed toilet transfer today using RW & gait belt at [place] level. Pt requires VC's for safety and sequencing, hand placement during transfers. Pt required assist w/ limb guard, however is not able to tolerated it being tight enough to stay on during ambulation (fell off x2).    OT Diagnosis:    OT Problem List:   OT Treatment Interventions:     OT Goals ADL Goals ADL Goal: Toilet Transfer - Progress: Progressing toward goals ADL Goal: Additional Goal #1 - Progress: Progressing toward goals  Visit Information  Last OT Received On: 12/20/12 Assistance Needed: +1    Subjective Data  Subjective: "I haven't had pain medicine and I've asked for it" "I'll get up, b/c you asked me to, but I don't want to" Patient Stated Goal: Home after rehab   Prior [place] Lives With: Other (Comment) (sister, [name] [name]) Available Help at Discharge:  (sister prn and aide 2 hrs per day) Bathroom Accessibility: Yes How Accessible: Accessible via walker Prior Function Vocation:  (has an aide 2 hrs per day)    Cognition  Cognition Overall Cognitive Status: Impaired Area of Impairment: Memory Arousal/Alertness: Awake/alert Orientation Level: Appears intact for tasks assessed Behavior During Session: [name] for tasks performed Safety/Judgement: Decreased awareness of need for assistance Safety/Judgement - Other Comments: Pt reports that she has been transfering w/o assist to 3:1, bed and chair (?if this is true). RN made aware of this as pt was up in chair at conclusion of session. Pt mentioning that she will be fine home alone, b/c her walker can do everything for her. Cognition - Other Comments: Pt is easily distracted and off topic during conversations and tasks related to  ADL's.    Mobility  Transfers Transfers: Sit to Stand;Stand to Sit Sit to Stand: 4: Min assist Stand to Sit: 4: Min assist;To chair/3-in-1;With upper  extremity assist;With armrests Details for Transfer Assistance: VC's for safety, sequencing and hand placement as well as RW use.         Balance Balance Balance Assessed: Yes Static Sitting Balance Static Sitting - Balance Support: No upper extremity supported;Feet supported Static Sitting - Level of Assistance: 7: Independent Static Standing Balance Static Standing - Balance Support: Bilateral upper extremity supported Static Standing - Level of Assistance: 4: Min assist   End of Session OT - End of Session Equipment Utilized During Treatment: Gait belt;Other (comment) (RW, 3:1 over toilet and grab bar) Activity Tolerance: Patient tolerated treatment well Patient left: in chair;with call bell/phone within reach Nurse Communication: Mobility status;Other (comment) (Pt reports of I transfers in room (? accuracy))  GO     Almyra Deforest 12/20/2012, 1:33 PM

## 2012-12-21 ENCOUNTER — Inpatient Hospital Stay (HOSPITAL_COMMUNITY): Payer: Medicaid Other | Admitting: Occupational Therapy

## 2012-12-21 ENCOUNTER — Inpatient Hospital Stay (HOSPITAL_COMMUNITY): Payer: Medicaid Other

## 2012-12-21 ENCOUNTER — Inpatient Hospital Stay (HOSPITAL_COMMUNITY): Payer: Medicaid Other | Admitting: Physical Therapy

## 2012-12-21 DIAGNOSIS — I10 Essential (primary) hypertension: Secondary | ICD-10-CM

## 2012-12-21 DIAGNOSIS — S88119A Complete traumatic amputation at level between knee and ankle, unspecified lower leg, initial encounter: Secondary | ICD-10-CM

## 2012-12-21 DIAGNOSIS — N189 Chronic kidney disease, unspecified: Secondary | ICD-10-CM

## 2012-12-21 DIAGNOSIS — L98499 Non-pressure chronic ulcer of skin of other sites with unspecified severity: Secondary | ICD-10-CM

## 2012-12-21 DIAGNOSIS — I739 Peripheral vascular disease, unspecified: Secondary | ICD-10-CM

## 2012-12-21 LAB — CBC WITH DIFFERENTIAL/PLATELET
Basophils Absolute: 0 10*3/uL (ref 0.0–0.1)
Eosinophils Relative: 5 % (ref 0–5)
Lymphocytes Relative: 23 % (ref 12–46)
Lymphs Abs: 2.1 10*3/uL (ref 0.7–4.0)
MCV: 87.3 fL (ref 78.0–100.0)
Neutrophils Relative %: 64 % (ref 43–77)
Platelets: 226 10*3/uL (ref 150–400)
RBC: 3.62 MIL/uL — ABNORMAL LOW (ref 3.87–5.11)
RDW: 15.1 % (ref 11.5–15.5)
WBC: 8.8 10*3/uL (ref 4.0–10.5)

## 2012-12-21 LAB — COMPREHENSIVE METABOLIC PANEL
ALT: 9 U/L (ref 0–35)
AST: 18 U/L (ref 0–37)
Alkaline Phosphatase: 95 U/L (ref 39–117)
CO2: 28 mEq/L (ref 19–32)
Calcium: 9.3 mg/dL (ref 8.4–10.5)
GFR calc non Af Amer: 27 mL/min — ABNORMAL LOW (ref >90)
Potassium: 3.8 mEq/L (ref 3.5–5.1)
Sodium: 144 mEq/L (ref 135–145)
Total Protein: 7 g/dL (ref 6.0–8.3)

## 2012-12-21 MED ORDER — TRAZODONE HCL 50 MG PO TABS
50.0000 mg | ORAL_TABLET | Freq: Once | ORAL | Status: AC
  Administered 2012-12-21: 50 mg via ORAL
  Filled 2012-12-21: qty 1
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
## 2012-12-21 NOTE — Progress Notes (Signed)
Subjective/Complaints: Slept fairly well. Just waking up. Denies pain.   Objective: Vital Signs: Blood pressure 138/63, pulse 65, temperature 98.4 F (36.9 C), temperature source Oral, resp. rate 18, height 5\' 2"  (1.575 m), weight 67.4 kg (148 lb 9.4 oz), SpO2 100.00%. No results found.  Recent Labs  12/20/12 1634 12/21/12 0600  WBC 9.9 8.8  HGB 10.9* 10.4*  HCT 32.6* 31.6*  PLT 251 226    Recent Labs  12/20/12 1634 12/21/12 0600  NA  --  144  K  --  3.8  CL  --  106  GLUCOSE  --  90  BUN  --  24*  CREATININE 1.77* 1.94*  CALCIUM  --  9.3   CBG (last 3)   Recent Labs  12/19/12 2055 12/20/12 0550 12/21/12 0725  GLUCAP 123* 87 92    Wt Readings from Last 3 Encounters:  12/20/12 67.4 kg (148 lb 9.4 oz)  12/16/12 65.2 kg (143 lb 11.8 oz)  12/16/12 65.2 kg (143 lb 11.8 oz)    Physical Exam:    HENT:  Head: Normocephalic.  Eyes: EOM are normal.  Neck: Neck supple. No thyromegaly present.  Cardiovascular: Normal rate and regular rhythm.  Pulmonary/Chest:  Decreased breath sounds at the bases but clear to auscultation  Abdominal: Bowel sounds are normal. She exhibits no distension.  Musculoskeletal:  Left BK tender. Edema around incision. Incision with minimal drainage. ACE wrap. Neurological:  Patient is slow to awaken.   She follows simple commands. Very alert. No CN findings. Mild PP and LT sensory deficits right foot. Intrinsic minus foot . UE's near 4+ to 5/5 prox to distal. LLE has full AROM at hip and knee which grade out 3+ to 4. RLE is 4 prox at HF to 4+ distally at ankle. DTR's are 1+   Assessment/Plan: 1. Functional deficits secondary to left BKA which require 3+ hours per day of interdisciplinary therapy in a comprehensive inpatient rehab setting. Physiatrist is providing close team supervision and 24 hour management of active medical problems listed below. Physiatrist and rehab team continue to assess barriers to discharge/monitor patient  progress toward functional and medical goals. FIM:             FIM - Control and instrumentation engineer Devices: Bed rails;Arm rests;HOB elevated Bed/Chair Transfer: 4: Supine > Sit: Min A (steadying Pt. > 75%/lift 1 leg);4: Sit > Supine: Min A (steadying pt. > 75%/lift 1 leg);4: Bed > Chair or W/C: Min A (steadying Pt. > 75%);4: Chair or W/C > Bed: Min A (steadying Pt. > 75%)     Comprehension Comprehension Mode: Auditory Comprehension: 4-Understands basic 75 - 89% of the time/requires cueing 10 - 24% of the time  Expression Expression Mode: Verbal Expression: 4-Expresses basic 75 - 89% of the time/requires cueing 10 - 24% of the time. Needs helper to occlude trach/needs to repeat words.  Social Interaction Social Interaction: 4-Interacts appropriately 75 - 89% of the time - Needs redirection for appropriate language or to initiate interaction.  Problem Solving Problem Solving: 4-Solves basic 75 - 89% of the time/requires cueing 10 - 24% of the time  Memory Memory: 4-Recognizes or recalls 75 - 89% of the time/requires cueing 10 - 24% of the time  Medical Problem List and Plan:  1. left below-knee amputation secondary to peripheral vascular disease 12/16/2012  2. DVT Prophylaxis/Anticoagulation: Subcutaneous Lovenox. Monitor platelet counts and any signs of bleeding  3. Pain Management: Neurontin 300 mg 3 times a day, Percocet 1-2  tablets every 4 hours as needed pain. Monitor with increased mobility  4. Neuropsych: This patient is capable of making decisions on his/her own behalf.  5. Acute blood loss anemia. Patient has been transfused with latest hemoglobin 9.4. Followup CBC  6. Hypertension. Coreg 6.25 mg twice a day, Lasix 80 mg daily, Imdur 60 mg daily, lisinopril 10 mg daily. Borderline control at present 7. Diet controlled diabetes mellitus. Latest hemoglobin A1c 5.4. No current diabetic agents. Checking blood sugars a.c. and at bedtime  8. Chronic renal  insufficiency. Baseline creatinine 1.89. Followup chemistries. May need to push po slightly 9. History of tobacco abuse. Counseling   LOS (Days) 1 A FACE TO FACE EVALUATION WAS PERFORMED  Taeveon Keesling T 12/21/2012 7:52 AM

## 2012-12-21 NOTE — Discharge Summary (Signed)
Agree with plans for transfer to CIR.  [name], MD, [place] [phone] 12/21/2012

## 2012-12-21 NOTE — Progress Notes (Signed)
Patient information reviewed and entered into eRehab system by Shiraz Bastyr, RN, CRRN, PPS Coordinator.  Information including medical coding and functional independence measure will be reviewed and updated through discharge.    

## 2012-12-21 NOTE — Progress Notes (Signed)
VASCULAR PROGRESS NOTE  SUBJECTIVE: Still with some discomfort at amputation site.   PHYSICAL EXAM: Filed Vitals:   12/20/12 1500 12/21/12 0500 12/21/12 0851  BP: 153/81 138/63 136/70  Pulse: 65 65   Temp: 97.8 F (36.6 C) 98.4 F (36.9 C)   TempSrc: Oral    Resp: 20 18   Height: 5\' 2"  (1.575 m)    Weight: 148 lb 9.4 oz (67.4 kg)    SpO2: 95% 100%    L BKA inspected and looks good so far. Minimal drainage.  LABS: Lab Results  Component Value Date   WBC 8.8 12/21/2012   HGB 10.4* 12/21/2012   HCT 31.6* 12/21/2012   MCV 87.3 12/21/2012   PLT 226 12/21/2012   Lab Results  Component Value Date   CREATININE 1.94* 12/21/2012   Lab Results  Component Value Date   INR 1.06 12/09/2012   CBG (last 3)   Recent Labs  12/19/12 2055 12/20/12 0550 12/21/12 0725  GLUCAP 123* 87 92    Principal Problem:   S/P BKA (below knee amputation)   ASSESSMENT AND PLAN: 1. POD 5  S/P L BKA 2. Continue dressing changes   Gae Gallop Beeper: [id number] 12/21/2012
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
## 2012-12-22 ENCOUNTER — Inpatient Hospital Stay (HOSPITAL_COMMUNITY): Payer: Medicaid Other

## 2012-12-22 ENCOUNTER — Inpatient Hospital Stay (HOSPITAL_COMMUNITY): Payer: Medicaid Other | Admitting: Occupational Therapy

## 2012-12-22 LAB — GLUCOSE, CAPILLARY
Glucose-Capillary: 97 mg/dL (ref 70–99)
Glucose-Capillary: 133 mg/dL — ABNORMAL HIGH (ref 70–99)

## 2012-12-22 NOTE — Progress Notes (Signed)
Physical Therapy Session Note  Patient Details  Name: [name] [name] MRN: [id number] Date of Birth: November 24, 1950  Today's Date: 12/22/2012 Time: 1530-1600 Time Calculation (min): 30 min  Short Term Goals: Week 1:  PT Short Term Goal 1 (Week 1): = LTGs  Skilled Therapeutic Interventions/Progress Updates:    Education provided on desensitization techniques, limb positioning, and importance of HEP. Seated EOB worked on ROM and strengthening exercises including LAQ and seated marches x 3 sets of 10 reps.  Therapy Documentation Precautions:  Precautions Precautions: ICD/Pacemaker Required Braces or Orthoses:  (L Limb guard) Restrictions Weight Bearing Restrictions: Yes LLE Weight Bearing: Non weight bearing Other Position/Activity Restrictions: left BKA  Pain: C/o pain in residual limb - RN notified for pain medication.   See FIM for current functional status  Therapy/Group: Individual Therapy  Canary Brim [facility] 12/22/2012, 4:22 PM

## 2012-12-22 NOTE — Progress Notes (Signed)
[facility] Individual Statement of Services  Patient Name:  [name] [name]  Date:  12/22/2012  Welcome to the [facility].  Our goal is to provide you with an individualized program based on your diagnosis and situation, designed to meet your specific needs.  With this comprehensive rehabilitation program, you will be expected to participate in at least 3 hours of rehabilitation therapies Monday-Friday, with modified therapy programming on the weekends.  Your rehabilitation program will include the following services:  Physical Therapy (PT), Occupational Therapy (OT), 24 hour per day rehabilitation nursing, Therapeutic Recreaction (TR), Neuropsychology, Case Management (Social Worker), Rehabilitation Medicine, Nutrition Services and Pharmacy Services  Weekly team conferences will be held on Tuesdays to discuss your progress.  Your  Social Worker will talk with you frequently to get your input and to update you on team discussions.  Team conferences with you and your family in attendance may also be held.  Expected length of stay: 10 days  Overall anticipated outcome: modified independent                                                                                                                        (from wheelchair)  Depending on your progress and recovery, your program may change.  Your  Social Worker will coordinate services and will keep you informed of any changes.  Your  Social Worker's name and contact numbers are listed  below.  The following services may also be recommended but are not provided by the [facility] will be made to provide these services after discharge if needed.  Arrangements include referral to agencies that provide these services.  Your insurance has been  verified to be:  Medicaid Your primary doctor is:  Dr. Mingo Amber @ [facility]  Pertinent information will be shared with your doctor and your insurance company.  Social Worker:  [place], [place] or [phone]  Information discussed with and copy given to patient by: Lennart Pall, 12/22/2012, 9:39 AM

## 2012-12-22 NOTE — Progress Notes (Signed)
Occupational Therapy Session Note  Patient Details  Name: NAIAH THEBO MRN: [id number] Date of Birth: [birth year]  Today's Date: 12/22/2012  Short Term Goals: Week 1:  OT Short Term Goal 1 (Week 1): Short Term Goals = Long Term Goals  Skilled Therapeutic Interventions/Progress Updates:   Session #1 1000-1100 - 60 Minutes Individual Therapy Patient with no complaints of pain Patient found supine in bed asleep. Patient woke easily, but with complaints of being tired and not sleeping well last night. Therapist discussed assisted fall with patient that happened earlier this am. Patient engaged in bed mobility and then transferred edge of bed -> w/c with steady assist. Patient performed ADL of UB/LB bathing & dressing at sink level in sit<>stand position. Patient stood at sink for peri care, therapist noticed patient getting tired from standing for long amount of time; therapist recommended patient sit for rest break - patient refused. Patient with inappropriate talk at times during session. Therapist recommended not to put lotion in-between toes on right foot; patient refused to listen to recommendation and stated her aide did at home. After ADL patient propelled self -> therapy gym for next therapy session.   Session #2 [id number] - 4 Minutes Individual Therapy Patient with complaints of pain in left knee, RN aware Patient found supine in bed. Patient transferred edge of bed -> w/c with steady assist. Patient then propelled self -> ADL apartment for simulated tub/shower transfer on/off tub transfer bench. Patient states her aide, Lattie Haw, will assist with her shower transfers and patient would not listen to any recommendations therapist was giving. Patient did practice transfer <>bench with steady assist. From there patient propelled self -> therapy gym for UE exercise using SCIFIT machine. Patient propelled self halfway back to room. Therapist left patient seated edge of bed with bed alarm set. Call  bell & phone left within reach.   Precautions:  Precautions Precautions: ICD/Pacemaker Required Braces or Orthoses:  (L Limb guard) Restrictions Weight Bearing Restrictions: Yes LLE Weight Bearing: Non weight bearing Other Position/Activity Restrictions: left BKA  See FIM for current functional status  Bernis Stecher 12/22/2012, 10:18 AM

## 2012-12-22 NOTE — Patient Care Conference (Signed)
Inpatient RehabilitationTeam Conference and Plan of Care Update Date: 12/21/2012   Time: 2:00 PM    Patient Name: [name] [name]      Medical Record Number: [id number]  Date of Birth: Mar 24, 1951 Sex: Female         Room/Bed: 4034/4034-01 Payor Info: Payor: MEDICAID [place]  Plan: MEDICAID [place] ACCESS  Product Type: *No Product type*     Admitting Diagnosis: LT BKA  Admit Date/Time:  12/20/2012  2:49 PM Admission Comments: No comment available   Primary Diagnosis:  S/P BKA (below knee amputation) Principal Problem: S/P BKA (below knee amputation)  Patient Active Problem List   Diagnosis Date Noted  . S/P BKA (below knee amputation) 12/20/2012  . Thrombocytopenia 11/04/2012  . Acute on chronic kidney disease, stage 3 11/04/2012  . Normocytic anemia 11/04/2012  . 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 s/p Hip fracture 11/04/2012  . Atherosclerosis of native arteries of the extremities with intermittent claudication 01/28/2012  . Atherosclerosis of native arteries of the extremities with ulceration(440.23) 12/03/2011  . Right shoulder pain 10/17/2011  . PACEMAKER-[employer] 03/15/2010  . HYPERPARATHYROIDISM, SECONDARY 01/16/2010  . Type 2 diabetes mellitus with diabetic chronic kidney disease 08/07/2008  . HYPERLIPIDEMIA 11/23/2007  . TOBACCO USER 11/23/2007  . DIABETIC  RETINOPATHY 08/12/2006  . DIABETIC PERIPHERAL NEUROPATHY 08/12/2006  . CARPAL TUNNEL SYNDROME 08/12/2006  . HYPERTENSION 08/12/2006  . PERIPHERAL VASCULAR DISEASE 08/12/2006  . FOOT ULCER 08/12/2006    Expected Discharge Date: Expected Discharge Date: 12/28/12  Team Members Present: Physician leading conference: Dr. Alger Simons Social Worker Present: Lennart Pall, LCSW Nurse Present: Other (comment) Rozetta Nunnery, RN) PT Present: Canary Brim, PT OT Present: Chrys Racer, OT     Current Status/Progress Goal Weekly Team Focus  Medical   left bka, htn, peripheral neuropathy   control pain. wound care  improve function, safety, pain control   Bowel/Bladder   continent bowel/bladder, wears brief for occasional bowel urgency, lbm 3/22 (edited)  continent bowel/bladder  toilet q3hours, monitor for bm q2days or more frequent    Swallow/Nutrition/ Hydration             ADL's   overall min assist  supervision -> mod I  ADL retraining, transfers, sit<>stands dynamic standing, overall activity tolerance/endurance   Mobility   min A overall  mod I w/c level; S to steady A with gait  transfers, gait training, functional strengthening, endurance/activity tolerance, dynamic standing balance   Communication             Safety/Cognition/ Behavioral Observations            Pain             Skin   l hip replacement scar, skin dry, bruising to abdomen, no breakdown  no new breakdown while on rehab  encourage/assist pt in turning/repositioning/using protective skin care products     Rehab Goals Patient on target to meet rehab goals: Yes *See Interdisciplinary Assessment and Plan and progress notes for long and short-term goals  Barriers to Discharge: architecture of home, peripheral neuropathy    Possible Resolutions to Barriers:  adaptive equipment, supervision at home    Discharge Planning/Teaching Needs:  Pt reports plan is to dc home with intermittent assistance from sister, [name].  Concern that sister not able to provide any increase in assistance and that house may not be wc accessible.  Need to follow up further.      Team Discussion:  New eval for  team.  Concerns as to whether pt could reach a mod i ambulation goal.  Hope to reach mod i w/c level goals, however, then a concern about home w/c accessibility.  SW to follow up with pt's sister to get measurements (may have to do home eval).    Revisions to Treatment Plan:  Not at this time.   Continued Need for Acute Rehabilitation Level of Care: The patient requires daily medical management by a physician  with specialized training in physical medicine and rehabilitation for the following conditions: Daily direction of a multidisciplinary physical rehabilitation program to ensure safe treatment while eliciting the highest outcome that is of practical value to the patient.: Yes Daily medical management of patient stability for increased activity during participation in an intensive rehabilitation regime.: Yes Daily analysis of laboratory values and/or radiology reports with any subsequent need for medication adjustment of medical intervention for : Cardiac problems;Post surgical problems;Neurological problems  [name] [name] 12/22/2012, 10:59 AM

## 2012-12-22 NOTE — Progress Notes (Signed)
Subjective/Complaints: No new complaints. Fell last night. Denies pain this am.  A 12 point review of systems has been performed and if not noted above is otherwise negative.  Objective: Vital Signs: Blood pressure 128/68, pulse 65, temperature 97 F (36.1 C), temperature source Oral, resp. rate 16, height 5\' 2"  (1.575 m), weight 67.4 kg (148 lb 9.4 oz), SpO2 97.00%. No results found.  Recent Labs  12/20/12 1634 12/21/12 0600  WBC 9.9 8.8  HGB 10.9* 10.4*  HCT 32.6* 31.6*  PLT 251 226    Recent Labs  12/20/12 1634 12/21/12 0600  NA  --  144  K  --  3.8  CL  --  106  GLUCOSE  --  90  BUN  --  24*  CREATININE 1.77* 1.94*  CALCIUM  --  9.3   CBG (last 3)   Recent Labs  12/20/12 0550 12/21/12 0725 12/22/12 0718  GLUCAP 87 92 97    Wt Readings from Last 3 Encounters:  12/20/12 67.4 kg (148 lb 9.4 oz)  12/16/12 65.2 kg (143 lb 11.8 oz)  12/16/12 65.2 kg (143 lb 11.8 oz)    Physical Exam:    HENT:  Head: Normocephalic.  Eyes: EOM are normal.  Neck: Neck supple. No thyromegaly present.  Cardiovascular: Normal rate and regular rhythm.  Pulmonary/Chest:  Decreased breath sounds at the bases but clear to auscultation  Abdominal: Bowel sounds are normal. She exhibits no distension.  Musculoskeletal:  Left BK tender. Edema around incision. Incision with minimal drainage. ACE wrap. Neurological:  Patient is alert.   She follows simple commands.   No CN findings. Mild PP and LT sensory deficits right foot. Intrinsic minus foot . UE's near 4+ to 5/5 prox to distal. LLE has full AROM at hip and knee which grade out 3+ to 4. RLE is 4 prox at HF to 4+ distally at ankle. DTR's are 1+   Assessment/Plan: 1. Functional deficits secondary to left BKA which require 3+ hours per day of interdisciplinary therapy in a comprehensive inpatient rehab setting. Physiatrist is providing close team supervision and 24 hour management of active medical problems listed below. Physiatrist  and rehab team continue to assess barriers to discharge/monitor patient progress toward functional and medical goals.  I discussed the fact that the patient needs to be open to learning and techniques presented by therapies. The patient denies any resistance to working with therapies==="I'm doing everything they're telling me".  Pt obviously lacks insight and awareness--may have some STM deficits. FIM: FIM - Bathing Bathing Steps Patient Completed: Chest;Right Arm;Left Arm;Abdomen;Front perineal area;Buttocks;Right upper leg;Left upper leg Bathing: 4: Min-Patient completes 8-9 37f 10 parts or 75+ percent  FIM - Upper Body Dressing/Undressing Upper body dressing/undressing: 0: Wears gown/pajamas-no public clothing FIM - Lower Body Dressing/Undressing Lower body dressing/undressing: 0: Wears gown/pajamas-no public clothing  FIM - Toileting Toileting steps completed by patient: Performs perineal hygiene Toileting: 2: Max-Patient completed 1 of 3 steps  FIM - Radio producer Devices: Recruitment consultant Transfers: 4-To toilet/BSC: Min A (steadying Pt. > 75%);4-From toilet/BSC: Min A (steadying Pt. > 75%)  FIM - Control and instrumentation engineer Devices: Walker;Arm rests Bed/Chair Transfer: 5: Supine > Sit: Supervision (verbal cues/safety issues);5: Sit > Supine: Supervision (verbal cues/safety issues);4: Bed > Chair or W/C: Min A (steadying Pt. > 75%);4: Chair or W/C > Bed: Min A (steadying Pt. > 75%)  FIM - Locomotion: Wheelchair Locomotion: Wheelchair: 2: Travels 50 - 149 ft with supervision, cueing or  coaxing FIM - Locomotion: Ambulation Locomotion: Ambulation Assistive Devices: Administrator Ambulation/Gait Assistance: 4: Min assist Locomotion: Ambulation: 1: Travels less than 50 ft with minimal assistance (Pt.>75%)  Comprehension Comprehension Mode: Auditory Comprehension: 5-Follows basic conversation/direction: With extra  time/assistive device  Expression Expression Mode: Verbal Expression: 5-Expresses basic needs/ideas: With extra time/assistive device  Social Interaction Social Interaction: 5-Interacts appropriately 90% of the time - Needs monitoring or encouragement for participation or interaction.  Problem Solving Problem Solving: 5-Solves basic 90% of the time/requires cueing < 10% of the time  Memory Memory: 5-Recognizes or recalls 90% of the time/requires cueing < 10% of the time  Medical Problem List and Plan:  1. left below-knee amputation secondary to peripheral vascular disease 12/16/2012  2. DVT Prophylaxis/Anticoagulation: Subcutaneous Lovenox. Monitor platelet counts and any signs of bleeding  3. Pain Management: Neurontin 300 mg 3 times a day, Percocet 1-2 tablets every 4 hours as needed pain. Monitor with increased mobility  4. Neuropsych: This patient is capable of making decisions on his/her own behalf.  5. Acute blood loss anemia. Patient has been transfused with latest hemoglobin 9.4. Followup CBC  6. Hypertension. Coreg 6.25 mg twice a day, Lasix 80 mg daily, Imdur 60 mg daily, lisinopril 10 mg daily. Borderline control at present 7. Diet controlled diabetes mellitus. Latest hemoglobin A1c 5.4. No current diabetic agents. Checking blood sugars a.c. and at bedtime  8. Chronic renal insufficiency. Baseline creatinine 1.89. Followup chemistries. May need to push po slightly 9. History of tobacco abuse. Counseling   LOS (Days) 2 A FACE TO FACE EVALUATION WAS PERFORMED  Shaka Cardin T 12/22/2012 8:32 AM

## 2012-12-22 NOTE — Progress Notes (Signed)
Physical Therapy Session Note  Patient Details  Name: [name] [name] MRN: [id number] Date of Birth: September 23, 1951  Today's Date: 12/22/2012 Time: 1100-1200 Time Calculation (min): 60 min  Short Term Goals: Week 1:  PT Short Term Goal 1 (Week 1): = LTGs  Skilled Therapeutic Interventions/Progress Updates:    Gait training with RW with min A; cues for safety and to use UE's to clear RLE vs sliding foot x 20'. Nustep for endurance and strengthening on Level 4 using UE's and RLE; halfway through due to complaint of L shoulder discomfort from prior surgery pt only using RUE and RLE x 8 min. Practiced transfers in ADL apartment on soft bed and furniture transfers to/from couch w/c level with steady A and cues for set up. Pt attempted "her method" of transferring and discussed with pt and sister why this was not recommended due to safety (mod A needed to prevent fall). Practiced correct and safe technique with min A. W/c mobility training on unit for UE endurance and strengthening as well as functional mobility.   Discussed and education for recommendations provided (SW, pt's sister, and pt present) due to concerns with w/c accessibility in the home and gait goals for S/steady A using RW. Determined that having a schedule for morning ADL's when pt would need access to bathroom (not w/c accessible) with assist present for gait and standing would be the best option. Pt's personal w/c will fit into the bed room per sister report and pt could be w/c level the rest of the day. Sister verbalizing understanding and in agreement with plan and recommendations; pt appears to have some difficulty remembering that how she used her RW before is not the same now with a new amputation and her decreased balance (pt with fall on unit earlier this AM due to LOB). Social worker also plans to look into extending pt's aide's hours to increased time that pt will have available S past d/c.   Therapy Documentation Precautions:   Precautions Precautions: ICD/Pacemaker Required Braces or Orthoses:  (L Limb guard) Restrictions Weight Bearing Restrictions: Yes LLE Weight Bearing: Non weight bearing Other Position/Activity Restrictions: left BKA  Pain: Pain Assessment Pain Assessment: 0-10 Pain Score:   9 Pain Type: Acute pain Pain Location: Leg Pain Orientation: Left Pain Descriptors: Aching Pain Onset: On-going Patients Stated Pain Goal: 4 Pain Intervention(s): Medication (See eMAR)  Locomotion : Ambulation Ambulation/Gait Assistance: 4: Min assist    See FIM for current functional status  Therapy/Group: Individual Therapy  Canary Brim [facility] 12/22/2012, 12:05 PM

## 2012-12-22 NOTE — Progress Notes (Signed)
Social Work Patient ID: [name] [name], female   DOB: 1951/05/14, 62 y.o.   MRN: [id number]  Met with patient and have spoken with sister (via phone) today to review team conference. Both aware that targeted d/c set for 12/28/12 with hopes that she will be able to reach mod i w/c level goals.  Very doubtful she could reach mod i with ambulation.  Explained to sister the concern with home accessibility.  She is to take measurements of doorways and bring to hospital today for therapists.  Will follow up with them both further when sister is present.  Pt only engages briefly with me this morning and insists (@ 9:30 am) that she is "... Going to take a nap" until therapy begins.  Will also monitor pt's ongoing participation with therapies and address any concerns regarding this if warranted.  Talitha Dicarlo, LCSW

## 2012-12-22 NOTE — Progress Notes (Signed)
[id number] using bathroom this morning patient leg gave away and was assist fall to floor .Vital signs stable no acute distress noted.Assisted back to bed by staff. Lauraine Rinne  PA aware.

## 2012-12-23 ENCOUNTER — Inpatient Hospital Stay (HOSPITAL_COMMUNITY): Payer: Medicaid Other

## 2012-12-23 ENCOUNTER — Inpatient Hospital Stay (HOSPITAL_COMMUNITY): Payer: Medicaid Other | Admitting: Occupational Therapy
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
## 2012-12-23 NOTE — Progress Notes (Signed)
Physical Therapy Session Note  Patient Details  Name: [name] [name] MRN: [id number] Date of Birth: 08/15/1951  Today's Date: 12/23/2012 Time: 1000-1058 Time Calculation (min): 58 min  Short Term Goals: Week 1:  PT Short Term Goal 1 (Week 1): = LTGs  Skilled Therapeutic Interventions/Progress Updates:    w/c propulsion on unit for functional mobility and endurance > 150'. Focused on transfer training with emphasis on set up of w/c and safety; cues needed. Supine therex for strengthening and ROM including quad sets, straight leg raises, sidelying hip abduction, sidelying hip extension x 10 reps x 2 sets each. Dynamic standing balance activity to pitch horseshoes with RW for balance with steady A when reaching outside BOS.   Therapy Documentation Precautions:  Precautions Precautions: ICD/Pacemaker Required Braces or Orthoses:  (L Limb guard) Restrictions Weight Bearing Restrictions: Yes LLE Weight Bearing: Non weight bearing Other Position/Activity Restrictions: left BKA  Pain: Reports pain in residual limb - premedicated.   See FIM for current functional status  Therapy/Group: Individual Therapy  Canary Brim [facility] 12/23/2012, 10:59 AM

## 2012-12-23 NOTE — Progress Notes (Signed)
Occupational Therapy Session Notes  Patient Details  Name: [name] [name] MRN: [id number] Date of Birth: January 07, 1951  Today's Date: 12/23/2012  Short Term Goals: Week 1:  OT Short Term Goal 1 (Week 1): Short Term Goals = Long Term Goals  Skilled Therapeutic Interventions/Progress Updates:   Session#1 0900-1000 - 60 Minutes Individual Therapy No complaints of pain Patient found supine in bed. Patient with request to use restroom. Patient sat edge of bed with supervision, then ambulated from edge of bed -> elevated toilet seat in bathroom using rolling walker with min assist from therapist. Patient ambulated from elevated toilet seat -> w/c for UB/LB bathing & dressing in sit<>stand position at sink level. Patient requires min assist when standing secondary to decreased dynamic standing balance/tolerance/endurance. Also focused skilled intervention on overall activity tolerance/endurance, functional use of bilateral UEs, grooming tasks seated at sink in w/c, and sit<>stands. At end of session left patient seated at sink to finish grooming tasks.  Session#2 [id number] - 30 Minutes Individual Therapy No complaints of pain Patient found seated in w/c beside bed. Patient propelled self from room -> therapy gym for therapeutic activity focusing on dynamic standing balance/tolerance/endurance and overall activity tolerance/endurance. For actvity, patient stood to play checkers. Therapist propelled patient back to room and assisted patient with toilet transfer and toileting (min assist). Patient then left seated in w/c beside bed with call bell & phone within reach, quick release belt donned.   Precautions:  Precautions Precautions: ICD/Pacemaker Required Braces or Orthoses:  (L Limb guard) Restrictions Weight Bearing Restrictions: Yes LLE Weight Bearing: Non weight bearing Other Position/Activity Restrictions: left BKA  See FIM for current functional status  Alin Hutchins 12/23/2012, 7:51  AM

## 2012-12-23 NOTE — Progress Notes (Signed)
Physical Therapy Session Note  Patient Details  Name: RHAELYNN BRUCH MRN: [id number] Date of Birth: 01/18/1951  Today's Date: 12/23/2012 Time: 1415-1500 Time Calculation (min): 45 min  Short Term Goals: Week 1:  PT Short Term Goal 1 (Week 1): = LTGs  Skilled Therapeutic Interventions/Progress Updates:    Treatment focused on w/c mobility on unit for endurance and strengthening, gait training with RW (x 15' x 15') and min to mod A with decreased awareness to foot catching multiple times with near LOB, standing therex to LLE including hip flexion/abduction/extension x 10 reps x 2 sets each, and dressing at w/c level working on dynamic sitting balance and safety.   Therapy Documentation Precautions:  Precautions Precautions: ICD/Pacemaker Required Braces or Orthoses:  (L Limb guard) Restrictions Weight Bearing Restrictions: Yes LLE Weight Bearing: Non weight bearing Other Position/Activity Restrictions: left BKA  Pain:  c/o pain in residual limb with mobility - premedicated.   See FIM for current functional status  Therapy/Group: Individual Therapy  Canary Brim [facility] 12/23/2012, 4:01 PM

## 2012-12-23 NOTE — Progress Notes (Signed)
Subjective/Complaints: No new complaints. Slow to awaken today.  A 12 point review of systems has been performed and if not noted above is otherwise negative.  Objective: Vital Signs: Blood pressure 164/83, pulse 65, temperature 97.6 F (36.4 C), temperature source Oral, resp. rate 18, height 5\' 2"  (1.575 m), weight 68.947 kg (152 lb), SpO2 100.00%. No results found.  Recent Labs  12/20/12 1634 12/21/12 0600  WBC 9.9 8.8  HGB 10.9* 10.4*  HCT 32.6* 31.6*  PLT 251 226    Recent Labs  12/20/12 1634 12/21/12 0600  NA  --  144  K  --  3.8  CL  --  106  GLUCOSE  --  90  BUN  --  24*  CREATININE 1.77* 1.94*  CALCIUM  --  9.3   CBG (last 3)   Recent Labs  12/22/12 1653 12/22/12 2052 12/23/12 0717  GLUCAP 133* 113* 84    Wt Readings from Last 3 Encounters:  12/22/12 68.947 kg (152 lb)  12/16/12 65.2 kg (143 lb 11.8 oz)  12/16/12 65.2 kg (143 lb 11.8 oz)    Physical Exam:    HENT:  Head: Normocephalic.  Eyes: EOM are normal.  Neck: Neck supple. No thyromegaly present.  Cardiovascular: Normal rate and regular rhythm.  Pulmonary/Chest:  Decreased breath sounds at the bases but clear to auscultation  Abdominal: Bowel sounds are normal. She exhibits no distension.  Musculoskeletal:  Left BK tender. Edema around incision. Incision with minimal drainage. ACE wrap. Neurological:  Patient awakens when physically stimulated.   She follows simple commands.   No CN findings. Mild PP and LT sensory deficits right foot. Intrinsic minus foot . UE's near 4+ to 5/5 prox to distal. LLE has full AROM at hip and knee which grade out 3+ to 4. RLE is 4 prox at HF to 4+ distally at ankle. DTR's are 1+   Assessment/Plan: 1. Functional deficits secondary to left BKA which require 3+ hours per day of interdisciplinary therapy in a comprehensive inpatient rehab setting. Physiatrist is providing close team supervision and 24 hour management of active medical problems listed  below. Physiatrist and rehab team continue to assess barriers to discharge/monitor patient progress toward functional and medical goals.    FIM: FIM - Bathing Bathing Steps Patient Completed: Chest;Right Arm;Left Arm;Abdomen;Front perineal area;Buttocks;Right upper leg;Left upper leg;Right lower leg (including foot) Bathing: 4: Min-Patient completes 8-9 72f 10 parts or 75+ percent  FIM - Upper Body Dressing/Undressing Upper body dressing/undressing steps patient completed: Thread/unthread right bra strap;Thread/unthread left bra strap;Thread/unthread right sleeve of pullover shirt/dresss;Thread/unthread left sleeve of pullover shirt/dress;Put head through opening of pull over shirt/dress;Pull shirt over trunk Upper body dressing/undressing: 4: Min-Patient completed 75 plus % of tasks FIM - Lower Body Dressing/Undressing Lower body dressing/undressing steps patient completed: Thread/unthread right underwear leg;Thread/unthread left underwear leg;Pull underwear up/down;Thread/unthread right pants leg;Thread/unthread left pants leg;Pull pants up/down;Don/Doff right shoe;Fasten/unfasten right shoe Lower body dressing/undressing: 4: Min-Patient completed 75 plus % of tasks  FIM - Toileting Toileting steps completed by patient: Performs perineal hygiene Toileting: 0: Activity did not occur  FIM - Radio producer Devices: Recruitment consultant Transfers: 0-Activity did not occur  FIM - Control and instrumentation engineer Devices: Arm rests Bed/Chair Transfer: 6: Supine > Sit: No assist;6: Sit > Supine: No assist;4: Bed > Chair or W/C: Min A (steadying Pt. > 75%);4: Chair or W/C > Bed: Min A (steadying Pt. > 75%)  FIM - Locomotion: Wheelchair Locomotion: Wheelchair: 2: Travels 50 -  149 ft with supervision, cueing or coaxing FIM - Locomotion: Ambulation Locomotion: Ambulation Assistive Devices: Walker - Rolling Ambulation/Gait Assistance: 4: Min  assist Locomotion: Ambulation: 1: Travels less than 50 ft with minimal assistance (Pt.>75%)  Comprehension Comprehension Mode: Auditory Comprehension: 5-Follows basic conversation/direction: With no assist  Expression Expression Mode: Verbal Expression: 5-Expresses basic needs/ideas: With no assist  Social Interaction Social Interaction Mode: Not assessed Social Interaction: 4-Interacts appropriately 75 - 89% of the time - Needs redirection for appropriate language or to initiate interaction.  Problem Solving Problem Solving Mode: Not assessed Problem Solving: 5-Solves complex 90% of the time/cues < 10% of the time  Memory Memory Mode: Not assessed Memory: 5-Recognizes or recalls 90% of the time/requires cueing < 10% of the time  Medical Problem List and Plan:  1. left below-knee amputation secondary to peripheral vascular disease 12/16/2012  2. DVT Prophylaxis/Anticoagulation: Subcutaneous Lovenox. Monitor platelet counts and any signs of bleeding  3. Pain Management: Neurontin 300 mg 3 times a day, Percocet 1-2 tablets every 4 hours as needed pain. Monitor with increased mobility  4. Neuropsych: This patient is capable of making decisions on his/her own behalf.  5. Acute blood loss anemia. Patient has been transfused with latest hemoglobin 9.4. Followup CBC  6. Hypertension. Coreg 6.25 mg twice a day, Lasix 80 mg daily, Imdur 60 mg daily, lisinopril 10 mg daily. Borderline control at present 7. Diet controlled diabetes mellitus. Latest hemoglobin A1c 5.4. Sugars under control 8. Chronic renal insufficiency. Baseline creatinine 1.89. Followup chemistries. May need to push po slightly 9. History of tobacco abuse. Counseling   LOS (Days) 3 A FACE TO FACE EVALUATION WAS PERFORMED  Kendel Bessey T 12/23/2012 7:59 AM

## 2012-12-23 NOTE — Progress Notes (Signed)
Patient had a one time order for CBG checks.  Clarified with Marlowe Shores, PA who states that it was a one time order and additional CBG checks are not needed.  Will continue to monitor.

## 2012-12-24 ENCOUNTER — Inpatient Hospital Stay (HOSPITAL_COMMUNITY): Payer: Medicaid Other

## 2012-12-24 ENCOUNTER — Inpatient Hospital Stay (HOSPITAL_COMMUNITY): Payer: Medicaid Other | Admitting: Occupational Therapy

## 2012-12-24 DIAGNOSIS — I739 Peripheral vascular disease, unspecified: Secondary | ICD-10-CM

## 2012-12-24 DIAGNOSIS — I10 Essential (primary) hypertension: Secondary | ICD-10-CM

## 2012-12-24 DIAGNOSIS — L98499 Non-pressure chronic ulcer of skin of other sites with unspecified severity: Secondary | ICD-10-CM

## 2012-12-24 DIAGNOSIS — S88119A Complete traumatic amputation at level between knee and ankle, unspecified lower leg, initial encounter: Secondary | ICD-10-CM

## 2012-12-24 DIAGNOSIS — N189 Chronic kidney disease, unspecified: Secondary | ICD-10-CM

## 2012-12-24 MED ORDER — LOPERAMIDE HCL 2 MG PO CAPS
2.0000 mg | ORAL_CAPSULE | ORAL | Status: DC | PRN
  Filled 2012-12-24: qty 1

## 2012-12-24 NOTE — Progress Notes (Signed)
Subjective/Complaints: No new complaints. Slow to awaken today.  A 12 point review of systems has been performed and if not noted above is otherwise negative.  Objective: Vital Signs: Blood pressure 162/68, pulse 66, temperature 98.2 F (36.8 C), temperature source Oral, resp. rate 18, height 5\' 2"  (1.575 m), weight 68.947 kg (152 lb), SpO2 95.00%. No results found. No results found for this basename: WBC, HGB, HCT, PLT,  in the last 72 hours No results found for this basename: NA, K, CL, CO, GLUCOSE, BUN, CREATININE, CALCIUM,  in the last 72 hours CBG (last 3)   Recent Labs  12/22/12 1653 12/22/12 2052 12/23/12 0717  GLUCAP 133* 113* 84    Wt Readings from Last 3 Encounters:  12/22/12 68.947 kg (152 lb)  12/16/12 65.2 kg (143 lb 11.8 oz)  12/16/12 65.2 kg (143 lb 11.8 oz)    Physical Exam:    HENT:  Head: Normocephalic.  Eyes: EOM are normal.  Neck: Neck supple. No thyromegaly present.  Cardiovascular: Normal rate and regular rhythm.  Pulmonary/Chest:  Decreased breath sounds at the bases but clear to auscultation  Abdominal: Bowel sounds are normal. She exhibits no distension.  Musculoskeletal:  Left BK tender. Edema around incision. Incision with minimal drainage. ACE wrap. Neurological:  Patient awakens when physically stimulated.   She follows simple commands.   No CN findings. Mild PP and LT sensory deficits right foot. Intrinsic minus foot . UE's near 4+ to 5/5 prox to distal. LLE has full AROM at hip and knee which grade out 3+ to 4. RLE is 4 prox at HF to 4+ distally at ankle. DTR's are 1+   Assessment/Plan: 1. Functional deficits secondary to left BKA which require 3+ hours per day of interdisciplinary therapy in a comprehensive inpatient rehab setting. Physiatrist is providing close team supervision and 24 hour management of active medical problems listed below. Physiatrist and rehab team continue to assess barriers to discharge/monitor patient progress toward  functional and medical goals.    FIM: FIM - Bathing Bathing Steps Patient Completed: Chest;Right Arm;Left Arm;Abdomen;Front perineal area;Buttocks;Right upper leg;Left upper leg;Right lower leg (including foot) Bathing: 4: Min-Patient completes 8-9 79f 10 parts or 75+ percent  FIM - Upper Body Dressing/Undressing Upper body dressing/undressing steps patient completed: Thread/unthread right bra strap;Thread/unthread left bra strap;Hook/unhook bra;Thread/unthread right sleeve of pullover shirt/dresss;Thread/unthread left sleeve of pullover shirt/dress;Put head through opening of pull over shirt/dress;Pull shirt over trunk Upper body dressing/undressing: 5: Set-up assist to: Obtain clothing/put away FIM - Lower Body Dressing/Undressing Lower body dressing/undressing steps patient completed: Thread/unthread right underwear leg;Thread/unthread left underwear leg;Pull underwear up/down;Thread/unthread right pants leg;Thread/unthread left pants leg;Pull pants up/down;Fasten/unfasten right shoe Lower body dressing/undressing: 4: Min-Patient completed 75 plus % of tasks  FIM - Toileting Toileting steps completed by patient: Adjust clothing prior to toileting;Performs perineal hygiene;Adjust clothing after toileting Toileting: 4: Steadying assist  FIM - Radio producer Devices: Elevated toilet seat;Walker;Grab bars Toilet Transfers: 4-To toilet/BSC: Min A (steadying Pt. > 75%);4-From toilet/BSC: Min A (steadying Pt. > 75%)  FIM - Bed/Chair Transfer Bed/Chair Transfer Assistive Devices: Arm rests Bed/Chair Transfer: 5: Supine > Sit: Supervision (verbal cues/safety issues);6: Sit > Supine: No assist;4: Bed > Chair or W/C: Min A (steadying Pt. > 75%);4: Chair or W/C > Bed: Min A (steadying Pt. > 75%)  FIM - Locomotion: Wheelchair Locomotion: Wheelchair: 5: Travels 150 ft or more: maneuvers on rugs and over door sills with supervision, cueing or coaxing FIM - Locomotion:  Ambulation Locomotion: Ambulation  Assistive Devices: Administrator Ambulation/Gait Assistance: 3: Mod assist;4: Min assist Locomotion: Ambulation: 1: Travels less than 50 ft with moderate assistance (Pt: 50 - 74%)  Comprehension Comprehension Mode: Auditory Comprehension: 5-Follows basic conversation/direction: With extra time/assistive device  Expression Expression Mode: Verbal Expression: 5-Expresses basic needs/ideas: With extra time/assistive device  Social Interaction Social Interaction Mode: Not assessed Social Interaction: 4-Interacts appropriately 75 - 89% of the time - Needs redirection for appropriate language or to initiate interaction.  Problem Solving Problem Solving Mode: Not assessed Problem Solving: 5-Solves basic problems: With no assist  Memory Memory Mode: Not assessed Memory: 5-Recognizes or recalls 90% of the time/requires cueing < 10% of the time  Medical Problem List and Plan:  1. left below-knee amputation secondary to peripheral vascular disease 12/16/2012  2. DVT Prophylaxis/Anticoagulation: Subcutaneous Lovenox. Monitor platelet counts and any signs of bleeding  3. Pain Management: Neurontin 300 mg 3 times a day, Percocet 1-2 tablets every 4 hours as needed pain.   -fair control at present 4. Neuropsych: This patient is capable of making decisions on his/her own behalf.  5. Acute blood loss anemia. Patient has been transfused with latest hemoglobin 9.4. Followup CBC  6. Hypertension. Coreg 6.25 mg twice a day, Lasix 80 mg daily, Imdur 60 mg daily, lisinopril 10 mg daily. Borderline control at present--continue with current plan 7. Diet controlled diabetes mellitus. Latest hemoglobin A1c 5.4. Sugars under control 8. Chronic renal insufficiency. Baseline creatinine 1.89. Encourage fluids. Recheck lytes Monday  -consider reducing lasix  9. History of tobacco abuse. Counseling   LOS (Days) 4 A FACE TO FACE EVALUATION WAS PERFORMED  SWARTZ,ZACHARY  T 12/24/2012 8:26 AM

## 2012-12-24 NOTE — Progress Notes (Signed)
Physical Therapy Session Note  Patient Details  Name: [name] [name] MRN: [id number] Date of Birth: 10/11/1950  Today's Date: 12/24/2012 Time: [id number] Time Calculation (min): 10 min  Short Term Goals: Week 1:  PT Short Term Goal 1 (Week 1): = LTGs  Skilled Therapeutic Interventions/Progress Updates:    Pt in bathroom; distant S transfer back to w/c and completed hygiene/brief management in standing. W/c mobility mod I in room and to negotiate out of bathroom. Pt states she is too worn out to do anymore therapy "I feel like I have been working for 8 hours straight." She says her stomach is upset and if she keeps trying to stand up and work she will have to use the bathroom. Elected to stay up in w/c at this time. RN notified of pt refusal.   Therapy Documentation Precautions:  Precautions Precautions: ICD/Pacemaker Required Braces or Orthoses:  (L Limb guard) Restrictions Weight Bearing Restrictions: Yes LLE Weight Bearing: Non weight bearing Other Position/Activity Restrictions: left BKA General: Amount of Missed PT Time (min): 20 Minutes Missed Time Reason: Patient ill (comment);Patient unwilling/refused to participate without medical reason   See FIM for current functional status  Therapy/Group: Individual Therapy  Canary Brim [facility] 12/24/2012, 4:05 PM

## 2012-12-24 NOTE — Progress Notes (Signed)
Occupational Therapy Session Note  Patient Details  Name: [name] [name] MRN: [id number] Date of Birth: 1951-05-03  Today's Date: 12/24/2012 Time: 1000-1027 Time Calculation (min): 27 min  Short Term Goals: Week 1:  OT Short Term Goal 1 (Week 1): Short Term Goals = Long Term Goals  Skilled Therapeutic Interventions/Progress Updates:    Pt in bed with covers over head.  Pt stated that she was tired and she wasn't going to leave room.  I explained that she was scheduled to bathe and dress.  Pt stated she was already "cleaned up" and she was going to get dressed.  Pt finally agreed to sitting EOB to participate in BUE therex with theraband. After 27 mins patient stated she wasn't going to do anything else and she was going to take a nap break today.  Patient laid back in bed and covered her head with the covers and asked me to turn off the lights.  Therapy Documentation Precautions:  Precautions Precautions: ICD/Pacemaker Required Braces or Orthoses:  (L Limb guard) Restrictions Weight Bearing Restrictions: Yes LLE Weight Bearing: Non weight bearing Other Position/Activity Restrictions: left BKA General: General Amount of Missed OT Time (min): 33 Minutes Pt refused to continue "I'm going to take a nap." V See FIM for current functional status  Therapy/Group: Individual Therapy  Leroy Libman 12/24/2012, 10:28 AM

## 2012-12-24 NOTE — Progress Notes (Signed)
Occupational Therapy Session Note  Patient Details  Name: [name] [name] MRN: [id number] Date of Birth: [birth year]  Today's Date: 12/24/2012 Time: 1400-1445 Time Calculation (min): 45 min  Short Term Goals: Week 1:  OT Short Term Goal 1 (Week 1): Short Term Goals = Long Term Goals  Skilled Therapeutic Interventions/Progress Updates:  No complaints of pain during session Patient found seated in w/c, sleepy. Patient stated she wanted to bathe. Patient propelled self -> dresser to pick out clothes, patient refused any regular clothes and requested a gown at this time. Patient then performed UB/LB bathing at sink level in sit<>stand position, standing for ~5 minutes to complete peri care (steady assist provided by therapist). Therapist notified RN of patient's request for a bandage change -> LLE. At end of session left patient seated in w/c beside bed with call bell & hone within reach.   Precautions:  Precautions Precautions: ICD/Pacemaker Required Braces or Orthoses:  (L Limb guard) Restrictions Weight Bearing Restrictions: Yes LLE Weight Bearing: Non weight bearing Other Position/Activity Restrictions: left BKA  See FIM for current functional status  Therapy/Group: Individual Therapy  Katianne Barre 12/24/2012, 3:08 PM

## 2012-12-24 NOTE — Progress Notes (Signed)
Physical Therapy Session Note  Patient Details  Name: [name] [name] MRN: [id number] Date of Birth: Jul 05, 1951  Today's Date: 12/24/2012 Time: 0830-0930 Time Calculation (min): 60 min  Short Term Goals: Week 1:  PT Short Term Goal 1 (Week 1): = LTGs  Skilled Therapeutic Interventions/Progress Updates:    Pt declined leaving room for AM therapy session due to not getting any rest last night. S transfer OOB and propelled w/c into the bathroom; min A needed to transfer to/from toilet (unsafe technique despite cueing). Home environment gait with RW in pt room working on navigating obstacles and turns in small space and transferring in/out of recliner and w/c with overall min A for gait. Discussed plan for d/c home with going into bathroom only with someone who can assist her with gait; otherwise needing to only use w/c when alone. During rest breaks completed seated and standing therex for knee extension/flexion and hip extension/flexion. W/c pushups in w/c for pressure relief technique and UE strengthening x 10 reps. Supine therex for LLE strengthening and ROm including SLR, hip abduction and hip extension in sidelying x 15 reps. Pt with need to use bathroom again; S transfer to w/c and steady A transfer to/from  Toilet (improved technique). Returned to bed end of session with S for squat pivot transfer.  Therapy Documentation Precautions:  Precautions Precautions: ICD/Pacemaker Required Braces or Orthoses:  (L Limb guard) Restrictions Weight Bearing Restrictions: Yes LLE Weight Bearing: Non weight bearing Other Position/Activity Restrictions: left BKA  Pain: No complaints.  See FIM for current functional status  Therapy/Group: Individual Therapy  Canary Brim [facility] 12/24/2012, 9:31 AM

## 2012-12-25 ENCOUNTER — Inpatient Hospital Stay (HOSPITAL_COMMUNITY): Payer: Medicaid Other | Admitting: Physical Therapy

## 2012-12-25 ENCOUNTER — Inpatient Hospital Stay (HOSPITAL_COMMUNITY): Payer: Medicaid Other

## 2012-12-25 DIAGNOSIS — I739 Peripheral vascular disease, unspecified: Secondary | ICD-10-CM

## 2012-12-25 NOTE — Progress Notes (Signed)
Subjective/Complaints: No specific complaints Appetite is improving  Objective: Vital Signs: Blood pressure 117/67, pulse 67, temperature 98.5 F (36.9 C), temperature source Oral, resp. rate 18, height 5\' 2"  (1.575 m), weight 152 lb (68.947 kg), SpO2 95.00%. CBG (last 3)   Recent Labs  12/22/12 1653 12/22/12 2052 12/23/12 0717  GLUCAP 133* 113* 84    Physical Exam:    HENT:  Head: Normocephalic.   Neck: Neck supple. No thyromegaly present.  Cardiovascular: Normal rate and regular rhythm.  Pulmonary/Chest:   clear to auscultation  Abdominal: Bowel sounds are normal. She exhibits no distension.  Musculoskeletal:  Left BK - ACE wrap in place Neurological:  Patient awakens when physically stimulated.   She follows simple commands.   No CN findings. Mild PP and LT sensory deficits right foot. Intrinsic minus foot . UE's near 4+ to 5/5 prox to distal. LLE has full AROM at hip and knee which grade out 3+ to 4. RLE is 4 prox at HF to 4+ distally at ankle. DTR's are 1+   Assessment/Plan: 1. Functional deficits secondary to left BKA  Medical Problem List and Plan:  1. left below-knee amputation  12/16/2012  2. DVT Prophylaxis/Anticoagulation: Subcutaneous Lovenox. Monitor platelet counts and any signs of bleeding  3. Pain Management: Neurontin 300 mg 3 times a day, Percocet 1-2 tablets every 4 hours as needed pain.   -fair control at present 4. Neuropsych: This patient is capable of making decisions on his/her own behalf.  5. Acute blood loss anemia.  CBC:    Component Value Date/Time   WBC 8.8 12/21/2012 0600   HGB 10.4* 12/21/2012 0600   HCT 31.6* 12/21/2012 0600   PLT 226 12/21/2012 0600   MCV 87.3 12/21/2012 0600   NEUTROABS 5.7 12/21/2012 0600   LYMPHSABS 2.1 12/21/2012 0600   MONOABS 0.6 12/21/2012 0600   EOSABS 0.4 12/21/2012 0600   BASOSABS 0.0 12/21/2012 0600   6. Hypertension. Coreg 6.25 mg twice a day, Lasix 80 mg daily, Imdur 60 mg daily, lisinopril 10 mg daily.  Borderline control at present--continue with current plan BP Readings from Last 3 Encounters:  12/25/12 117/67  12/20/12 144/56  12/20/12 144/56   7. Diet controlled diabetes mellitus. Latest hemoglobin A1c 5.4. Sugars under control 8. Chronic renal insufficiency. Baseline creatinine 1.89.  Lab Results  Component Value Date   CREATININE 1.94* 12/21/2012   9. History of tobacco abuse.   LOS (Days) 5 A FACE TO FACE EVALUATION WAS PERFORMED  [facility] HENRY 12/25/2012 12:47 PM

## 2012-12-25 NOTE — Progress Notes (Signed)
Physical Therapy Session Note  Patient Details  Name: RIKKA PARAISO MRN: [id number] Date of Birth: 28-Feb-1951  Today's Date: 12/25/2012 Time: 1100-1200 Time Calculation (min): 60 min  Short Term Goals: Week 1:  PT Short Term Goal 1 (Week 1): = LTGs  Therapy Documentation Precautions:  Precautions: ICD/Pacemaker Required Braces or Orthoses:  (L Limb guard) Restrictions Weight Bearing Restrictions: Yes LLE Weight Bearing: Non weight bearing Other Position/Activity Restrictions: left BKA Pain: 0/10 rest and then during tx session c/o 5/10 L BKA site. Nursing notified and pain meds delivered.  Gait Training:(15') 4 x 30', 2 x 20' using RW with S/min-A Therapeutic Exercise:(15') sitting in W/C and on mat table including L LE exercises and stretching  Wheelchair Management:(15') Safety for brakes, foot rest/arm rest with transfers. Therapeutic Activity:(15') Transfers sit<->stand with S/min-Assist and w/c<->mat table S/min-Assist.  See FIM for current functional status  Therapy/Group: Individual Therapy  Tedric Leeth J 12/25/2012, 11:10 AM

## 2012-12-25 NOTE — Progress Notes (Signed)
Physical Therapy Note  Patient Details  Name: [name] [name] MRN: [id number] Date of Birth: Feb 11, 1951 Today's Date: 12/25/2012  1525-1620 (55 minutes) individual Pain : 30/10 LT BKA / nurse notified Focus of treatment: car transfer training, gait training, bed mobility , review safety issues related to standing balance, transfers Treatment: Pt up in wc; car transfer training with RW SBA with vcs for hand placement stand to sit ; gait 25 feet X 2 RW min assist with vcs for hand placement stand to sit ; bed mobility - independent; reviewed reaching safety when using AD (use RT UE vs left).    Fread Kottke,JIM 12/25/2012, 4:14 PM

## 2012-12-25 NOTE — Progress Notes (Signed)
Occupational Therapy Note  Patient Details  Name: [name] [name] MRN: [id number] Date of Birth: July 02, 1951 Today's Date: 12/25/2012  Tx #1: Time: 9:36-10:06am (60min.) Pt seen for 1:1 OT session focusing on ADL re-training. Pt in bed upon arrival, not wanting to get up. After several minutes of speaking to pt and encouraging her, she agreed to get up to wash up and dress, but only at EOB. Pt sat EOB to complete bathing with set up. Pt did not have c/o pain this morning and stated normally she starts to hurt around noon. Pt completed multiple sit<> stands during bathing with CGA. When asked what her biggest concern is upon dc, pt stated, "I'm afraid of what my family will think of me since last time they saw me I had 2 legs."  Pt also states she will be comfortable going home as she has an aide who will assist her 5-7 days a week.   Tx #2: Time: [id number] (26min.) Pt sitting EOB on arrival, stating she's "sick of" therapy. Therapist had to encourage to get pt to participate in order to make progress to be discharged. Pt did not want to rehearse transfers onto any surface. Pt agreed to try arm bike which she was only able to complete about 6 minutes. Pt states she has pain in L sh secondary to bursitis and the bike was irritating it (no number given.)  Ended session with pt moving onto PT session.  Kade Demicco Corlis Leak 12/25/2012, 12:36 PM

## 2012-12-26 ENCOUNTER — Inpatient Hospital Stay (HOSPITAL_COMMUNITY): Payer: Medicaid Other | Admitting: Occupational Therapy

## 2012-12-26 MED ORDER — LISINOPRIL 20 MG PO TABS
20.0000 mg | ORAL_TABLET | Freq: Every day | ORAL | Status: DC
  Administered 2012-12-27 – 2012-12-28 (×2): 20 mg via ORAL
  Filled 2012-12-26 (×4): qty 1

## 2012-12-26 MED ORDER — CARVEDILOL 12.5 MG PO TABS
12.5000 mg | ORAL_TABLET | Freq: Two times a day (BID) | ORAL | Status: DC
  Administered 2012-12-26 – 2012-12-28 (×4): 12.5 mg via ORAL
  Filled 2012-12-26 (×6): qty 1

## 2012-12-26 NOTE — Progress Notes (Signed)
Subjective/Complaints: No specific complaints Eating well this a.m.  Objective: Vital Signs: Blood pressure 161/71, pulse 64, temperature 98.1 F (36.7 C), temperature source Oral, resp. rate 17, height 5\' 2"  (1.575 m), weight 152 lb (68.947 kg), SpO2 97.00%.  Physical Exam:    HENT:  Head: Normocephalic.   Neck: Neck supple. No thyromegaly present.  Cardiovascular: Normal rate and regular rhythm.  Pulmonary/Chest:   clear to auscultation  Abdominal: Bowel sounds are normal. She exhibits no distension.  Musculoskeletal:  Left BK - ACE wrap in place Neurological: alert and talkative   Assessment/Plan: 1. Functional deficits secondary to left BKA  Medical Problem List and Plan:  1. left below-knee amputation  12/16/2012  2. DVT Prophylaxis/Anticoagulation: Subcutaneous Lovenox. Monitor platelet counts and any signs of bleeding  3. Pain Management: Neurontin 300 mg 3 times a day, Percocet 1-2 tablets every 4 hours as needed pain.   -fair control at present 4. Neuropsych: This patient is capable of making decisions on his/her own behalf.  5. Acute blood loss anemia.  CBC:    Component Value Date/Time   WBC 8.8 12/21/2012 0600   HGB 10.4* 12/21/2012 0600   HCT 31.6* 12/21/2012 0600   PLT 226 12/21/2012 0600   MCV 87.3 12/21/2012 0600   NEUTROABS 5.7 12/21/2012 0600   LYMPHSABS 2.1 12/21/2012 0600   MONOABS 0.6 12/21/2012 0600   EOSABS 0.4 12/21/2012 0600   BASOSABS 0.0 12/21/2012 0600   6. Hypertension. Coreg 6.25 mg twice a day, Lasix 80 mg daily, Imdur 60 mg daily, lisinopril 10 mg daily. Borderline control at present--continue with current plan BP Readings from Last 3 Encounters:  12/26/12 161/71  12/20/12 144/56  12/20/12 144/56  not as well controlled- will increase lisinopril and coreg on 12/26/2012  7. Diet controlled diabetes mellitus. Latest hemoglobin A1c 5.4. Sugars under control 8. Chronic renal insufficiency. Baseline creatinine 1.89.  Lab Results  Component  Value Date   CREATININE 1.94* 12/21/2012   9. History of tobacco abuse.   LOS (Days) 6 A FACE TO FACE EVALUATION WAS PERFORMED  SWORDS,BRUCE HENRY 12/26/2012 9:03 AM

## 2012-12-26 NOTE — Progress Notes (Signed)
Occupational Therapy Session Note  Patient Details  Name: [name] [name] MRN: [id number] Date of Birth: 23-Feb-1951  Today's Date: 12/26/2012 Time: 1125-1225 Time Calculation (min): 60 min  Skilled Therapeutic Interventions/Progress Updates: ADLin w/c at sink.   Focus was on peribathing and dressing as patient balanced at sink with close S by propping her L amputated leg onto her w/c with good balance and also was able to stand at sink to pull up pants.  Both with S to slightly steadying assist.     Therapy Documentation Precautions:  Precautions Precautions: ICD/Pacemaker Required Braces or Orthoses:  (L Limb guard) Restrictions Weight Bearing Restrictions: Yes LLE Weight Bearing: Non weight bearing Other Position/Activity Restrictions: NWB LT LE  Pain:denied  See FIM for current functional status  Therapy/Group: Individual Therapy  Alfredia Ferguson [facility] 12/26/2012, 5:16 PM

## 2012-12-26 NOTE — Progress Notes (Signed)
Attempted to teach limb wrapping. Patient said she would not be learning Her aide would do it at home

## 2012-12-27 ENCOUNTER — Encounter (HOSPITAL_COMMUNITY): Payer: Medicaid Other

## 2012-12-27 ENCOUNTER — Inpatient Hospital Stay (HOSPITAL_COMMUNITY): Payer: Medicaid Other | Admitting: Physical Therapy

## 2012-12-27 ENCOUNTER — Inpatient Hospital Stay (HOSPITAL_COMMUNITY): Payer: Medicaid Other | Admitting: Occupational Therapy

## 2012-12-27 ENCOUNTER — Ambulatory Visit (HOSPITAL_COMMUNITY): Payer: Medicaid Other

## 2012-12-27 ENCOUNTER — Inpatient Hospital Stay (HOSPITAL_COMMUNITY): Payer: Medicaid Other

## 2012-12-27 DIAGNOSIS — I739 Peripheral vascular disease, unspecified: Secondary | ICD-10-CM

## 2012-12-27 DIAGNOSIS — N189 Chronic kidney disease, unspecified: Secondary | ICD-10-CM

## 2012-12-27 DIAGNOSIS — I10 Essential (primary) hypertension: Secondary | ICD-10-CM

## 2012-12-27 DIAGNOSIS — S88119A Complete traumatic amputation at level between knee and ankle, unspecified lower leg, initial encounter: Secondary | ICD-10-CM

## 2012-12-27 DIAGNOSIS — L98499 Non-pressure chronic ulcer of skin of other sites with unspecified severity: Secondary | ICD-10-CM

## 2012-12-27 LAB — BASIC METABOLIC PANEL
BUN: 49 mg/dL — ABNORMAL HIGH (ref 6–23)
Chloride: 101 mEq/L (ref 96–112)
Creatinine, Ser: 2.04 mg/dL — ABNORMAL HIGH (ref 0.50–1.10)
Glucose, Bld: 98 mg/dL (ref 70–99)
Potassium: 5.4 mEq/L — ABNORMAL HIGH (ref 3.5–5.1)

## 2012-12-27 MED ORDER — FUROSEMIDE 80 MG PO TABS
80.0000 mg | ORAL_TABLET | Freq: Every day | ORAL | Status: DC
  Filled 2012-12-27: qty 1

## 2012-12-27 MED ORDER — ENOXAPARIN SODIUM 30 MG/0.3ML ~~LOC~~ SOLN
30.0000 mg | SUBCUTANEOUS | Status: DC
  Administered 2012-12-28: 30 mg via SUBCUTANEOUS
  Filled 2012-12-27 (×2): qty 0.3

## 2012-12-27 NOTE — Discharge Summary (Signed)
  Discharge summary job # [id number]

## 2012-12-27 NOTE — Progress Notes (Signed)
Physical Therapy Discharge Summary  Patient Details  Name: [name] [name] MRN: [id number] Date of Birth: [birth year]  Today's Date: 12/28/2012  Time: 830-925  (55 min) Individual therapy; Reports pain in residual limb but manageable at the moment and premedicated. Focused on completing graduation day activities including gait with RW with steady A x 35' (increased as fatigued due to decreased foot clearance), simulated car transfer with close S, basic transfers in ADL apartment and bed mobility mod I, reviewed and education on importance of HEP/ROM of residual limb in preparation for prosthesis in future, and reviewed recommendation for assist with gait at home upon d/c and w/c level for when home alone to which pt in agreement. Pt continues to decline using amputee pad on w/c for positioning during therapy sessions, discussed importance of use at home to maintain extension, but pt states it causes her "too much pain" right now to put it on.    Patient has met 8 of 9 long term goals due to improved activity tolerance, improved balance and increased strength.  Patient to discharge at a wheelchair level Modified Independent and min A for gait short distances with RW. Pt's aide will provide assist for gait into bathroom as needed (due to w/c not able to fit); sister with whom pt lives with will provide intermittent supervision. Have discussed recommendation for w/c level only when pt home alone with pt and family who both verbalized understanding and are in agreement.   Reasons goals not met:  Pt requires S for dynamic standing balance using RW.   Recommendation:  Patient will benefit from ongoing skilled PT services in home health setting to continue to advance safe functional mobility, address ongoing impairments in strength, endurance, gait, balance, pre-prosthetic education, skin care/protection, and minimize fall risk.  Equipment: L amputee pad for current w/c. Pt already owns manual w/c and  RW.  Reasons for discharge: treatment goals met and discharge from hospital  Patient/family agrees with progress made and goals achieved: Yes  PT Discharge Precautions/Restrictions Precautions Precautions: ICD/Pacemaker Restrictions Weight Bearing Restrictions: Yes LLE Weight Bearing: Non weight bearing Cognition Overall Cognitive Status: Appears within functional limits for tasks assessed Orientation Level: Oriented X4 Memory: Impaired Memory Impairment: Decreased short term memory Sensation Sensation Light Touch:  (same as admission; ) Proprioception: Impaired Detail Proprioception Impaired Details: Impaired LLE Additional Comments: phantom 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 Coordination Gross Motor Movements are Fluid and Coordinated: Yes Motor  Motor Motor: Within Functional Limits       Trunk/Postural Assessment  Cervical Assessment Cervical Assessment: Within Functional Limits Thoracic Assessment Thoracic Assessment:  (same as admission ) Lumbar Assessment Lumbar Assessment:  (same as admission)  Balance Static Sitting Balance Static Sitting - Level of Assistance: 7: Independent Dynamic Sitting Balance Dynamic Sitting - Level of Assistance: 6: Modified independent (Device/Increase time) Static Standing Balance Static Standing - Level of Assistance: 5: Stand by assistance Dynamic Standing Balance Dynamic Standing - Level of Assistance: 5: Stand by assistance Extremity Assessment  RUE Assessment RUE Assessment: Within Functional Limits LUE Assessment LUE Assessment: Exceptions to WFL LUE AROM (degrees) LUE Overall AROM Comments: decreased shoulder AROM, flexion =~90* (patient reports fracture years ago), elbow AROM WFL LUE PROM (degrees) LUE Overall PROM Comments: decreased shoulder AROM, patient reports pain when shoulder is passively ranged, elbow PROM WFL LUE Strength LUE Overall Strength Comments: shoulder decreased, elbow 5/5 strength RLE  Assessment RLE Assessment: Within Functional Limits (grossly 4/5; dec muscular endurance) LLE Assessment LLE Assessment: Exceptions to [facility] (hip and  knee ROM WFL; strength grossly 3 to 3+/5 pain limits)  See FIM for current functional status  Canary Brim [facility] 12/28/2012, 9:28 AM

## 2012-12-27 NOTE — Progress Notes (Signed)
Physical Therapy Note  Patient Details  Name: [name] [name] MRN: [id number] Date of Birth: Mar 15, 1951 Today's Date: 12/27/2012  Pt missed 30 minutes of skilled PT. Pt request to have ACE bandage re-wrapped and complained of 20/10 pain. RN aware and pain medication given. Pt refused to participate in therapy session. Discussed need to schedule AM therapy tomorrow to complete Grad Day and D/C information - pt verbalized understanding. RN and Education officer, museum made aware.  Canary Brim [facility] 12/27/2012, 2:56 PM

## 2012-12-27 NOTE — Progress Notes (Signed)
Occupational Therapy Note  Patient Details  Name: [name] [name] MRN: [id number] Date of Birth: 07-Feb-1951 Today's Date: 12/27/2012  Time: 10:38-11:30am (66min.) Pt seen for 1:1 OT session focusing on transfers, UE strengthening and standing dynamic balance. Pt in wheelchair upon arrival, with no pain reported. Rehearsed tub bench transfer using RW from wheelchair and CGA. Pt stood at table with RW placing resistive clothespins onto arc using one hand to stabilize. Pt c/o arthritis pain in R hand during clothespin activity. Pt took one rest break during standing activity and was able to stand at least 59min at a time. Pt sat on mat table to complete UE exercises with green theraband including sh flexion, bicep curls and rowing exercise x10, 2 sets each. Ended session with pt sitting in wheelchair in room with quick release belt donned and call bell within reach.   Kamil Mchaffie Corlis Leak 12/27/2012, 12:20 PM

## 2012-12-27 NOTE — Progress Notes (Signed)
Physical Therapy Session Note  Patient Details  Name: [name] [name] MRN: [id number] Date of Birth: Aug 03, 1951  Today's Date: 12/27/2012 Time: 0800-0900 Time Calculation (min): 60 min  Short Term Goals: Week 1:  PT Short Term Goal 1 (Week 1): = LTGs     Skilled Therapeutic Interventions/Progress Updates:  W/c propulsion x 190' x 2 with distant supervision, cues for route finding.  Therapeutic exercise performed with bil LE to increase strength for functional mobility; LLE BKA exs and R hip abductors in sidelying; sitting sustained stretch LLE knee flexors, 5# on knee.  Gait training with RW, mod VCs for safe use of RW, x 35' x 2. Pt's safety improved on second trial.  Nu Step x 6 minutes at level 5, RUE and RLE; pt reported pain RUE due to previous shoulder fx, and did not want to use it; for general endurance.  Transfers with supervision, squat pivot.    Therapy Documentation Precautions:  Precautions Precautions: ICD/Pacemaker Required Braces or Orthoses:  (L Limb guard) Restrictions Weight Bearing Restrictions: Yes LLE Weight Bearing: Non weight bearing Other Position/Activity Restrictions: NWB LT LE    See FIM for current functional status  Therapy/Group: Individual Therapy  Marketa Midkiff 12/27/2012, 2:39 PM

## 2012-12-27 NOTE — Progress Notes (Signed)
Subjective/Complaints: No new complaints. Up in chair at sink already. Got herself out of bed to sink. A 12 point review of systems has been performed and if not noted above is otherwise negative.  Objective: Vital Signs: Blood pressure 176/61, pulse 65, temperature 97.5 F (36.4 C), temperature source Oral, resp. rate 18, height 5\' 2"  (1.575 m), weight 68.947 kg (152 lb), SpO2 99.00%. No results found. No results found for this basename: WBC, HGB, HCT, PLT,  in the last 72 hours No results found for this basename: NA, K, CL, CO, GLUCOSE, BUN, CREATININE, CALCIUM,  in the last 72 hours CBG (last 3)  No results found for this basename: GLUCAP,  in the last 72 hours  Wt Readings from Last 3 Encounters:  12/22/12 68.947 kg (152 lb)  12/16/12 65.2 kg (143 lb 11.8 oz)  12/16/12 65.2 kg (143 lb 11.8 oz)    Physical Exam:    HENT:  Head: Normocephalic.  Eyes: EOM are normal.  Neck: Neck supple. No thyromegaly present.  Cardiovascular: Normal rate and regular rhythm.  Pulmonary/Chest:  Decreased breath sounds at the bases but clear to auscultation  Abdominal: Bowel sounds are normal. She exhibits no distension.  Musculoskeletal:  Left BK still somewhat tender. Edema around incision. Incision with minimal drainage.  . Neurological:  Patient awakens when physically stimulated.   She follows simple commands.   No CN findings. Mild PP and LT sensory deficits right foot. Intrinsic minus foot . UE's near 4+ to 5/5 prox to distal. LLE has full AROM at hip and knee which grade out 3+ to 4. RLE is 4 prox at HF to 4+ distally at ankle. DTR's are 1+   Assessment/Plan: 1. Functional deficits secondary to left BKA which require 3+ hours per day of interdisciplinary therapy in a comprehensive inpatient rehab setting. Physiatrist is providing close team supervision and 24 hour management of active medical problems listed below. Physiatrist and rehab team continue to assess barriers to  discharge/monitor patient progress toward functional and medical goals.    FIM: FIM - Bathing Bathing Steps Patient Completed: Chest;Right Arm;Left Arm;Abdomen;Front perineal area;Buttocks;Right upper leg;Left upper leg;Right lower leg (including foot) Bathing: 0: Activity did not occur  FIM - Upper Body Dressing/Undressing Upper body dressing/undressing steps patient completed: Thread/unthread right bra strap;Thread/unthread left bra strap;Hook/unhook bra;Thread/unthread right sleeve of pullover shirt/dresss;Thread/unthread left sleeve of pullover shirt/dress;Put head through opening of pull over shirt/dress;Pull shirt over trunk;Thread/unthread right sleeve of front closure shirt/dress Upper body dressing/undressing: 0: Activity did not occur FIM - Lower Body Dressing/Undressing Lower body dressing/undressing steps patient completed: Thread/unthread right underwear leg;Thread/unthread left underwear leg;Pull underwear up/down;Thread/unthread right pants leg;Thread/unthread left pants leg;Pull pants up/down;Don/Doff right sock Lower body dressing/undressing: 0: Activity did not occur  FIM - Toileting Toileting steps completed by patient: Adjust clothing prior to toileting;Performs perineal hygiene;Adjust clothing after toileting Toileting Assistive Devices: Grab bar or rail for support Toileting: 0: Activity did not occur  FIM - Radio producer Devices: Product manager Transfers: 0-Activity did not occur  FIM - Control and instrumentation engineer Devices: Copy: 3: Bed > Chair or W/C: Mod A (lift or lower assist);3: Chair or W/C > Bed: Mod A (lift or lower assist)  FIM - Locomotion: Wheelchair Distance: 150 Locomotion: Wheelchair: 5: Travels 150 ft or more: maneuvers on rugs and over door sills with supervision, cueing or coaxing FIM - Locomotion: Ambulation Locomotion: Ambulation Assistive Devices: Astronomer Ambulation/Gait Assistance: 4: Min guard Locomotion:  Ambulation: 1: Travels less than 50 ft with minimal assistance (Pt.>75%)  Comprehension Comprehension Mode: Auditory Comprehension: 5-Follows basic conversation/direction: With extra time/assistive device  Expression Expression Mode: Verbal Expression: 5-Expresses basic 90% of the time/requires cueing < 10% of the time.  Social Interaction Social Interaction Mode: Not assessed Social Interaction: 4-Interacts appropriately 75 - 89% of the time - Needs redirection for appropriate language or to initiate interaction.  Problem Solving Problem Solving Mode: Not assessed Problem Solving: 5-Solves basic 90% of the time/requires cueing < 10% of the time  Memory Memory Mode: Not assessed Memory: 5-Recognizes or recalls 90% of the time/requires cueing < 10% of the time  Medical Problem List and Plan:  1. left below-knee amputation secondary to peripheral vascular disease 12/16/2012  2. DVT Prophylaxis/Anticoagulation: Subcutaneous Lovenox. Monitor platelet counts and any signs of bleeding  3. Pain Management: Neurontin 300 mg 3 times a day, Percocet 1-2 tablets every 4 hours as needed pain.   -fair control at present 4. Neuropsych: This patient is capable of making decisions on his/her own behalf.  5. Acute blood loss anemia. Patient has been transfused with latest hemoglobin 9.4. Followup CBC  6. Hypertension. Coreg increased to 12.5mg  twice a day, Lasix 80 mg daily, Imdur 60 mg daily, lisinopril 20 mg daily as of today. Follow for improvement with recent changes 7. Diet controlled diabetes mellitus. Latest hemoglobin A1c 5.4. Sugars under control 8. Chronic renal insufficiency. Baseline creatinine 1.89. Encourage fluids. Recheck lytes today  -consider reducing lasix  9. History of tobacco abuse. Counseling   LOS (Days) 7 A FACE TO FACE EVALUATION WAS PERFORMED  Quin Mcpherson T 12/27/2012 7:50 AM

## 2012-12-27 NOTE — Progress Notes (Signed)
Occupational Therapy Session Note  Patient Details  Name: [name] [name] MRN: [id number] Date of Birth: 05/18/1951  Today's Date: 12/27/2012 Time: 0900-1000 Time Calculation (min): 60 min  Short Term Goals: Week 1:  OT Short Term Goal 1 (Week 1): Short Term Goals = Long Term Goals  Skilled Therapeutic Interventions/Progress Updates:  Patient found seated in w/c and she stated she already completed bathing and dressing at sink. Focused skilled intervention on desensitization -> left residual limb; washing limb, performing exercises, and re-wrapping limb. Patient with complaints of phantom pain, therapist educated patient on conservative measures to help decrease pain. After limb care patient propelled self -> family room and engaged in furniture transfer w/c <>couch with close supervision. Patient then propelled self -> ADL apartment for education regarding kitchen tasks; therapist recommends patient stay seated in w/c during IADL tasks - patient agrees to recommendation. Patient then propelled self -> therapy gym for UE exercise using weighted bar. Patient propelled self back to room and therapist left patient seated in w/c with call bell & phone within reach.  Precautions:  Precautions Precautions: ICD/Pacemaker Required Braces or Orthoses:  (L Limb guard) Restrictions Weight Bearing Restrictions: Yes LLE Weight Bearing: Non weight bearing Other Position/Activity Restrictions: NWB LT LE  See FIM for current functional status  Therapy/Group: Individual Therapy  Denym Christenberry 12/27/2012, 11:19 AM

## 2012-12-28 ENCOUNTER — Inpatient Hospital Stay (HOSPITAL_COMMUNITY): Payer: Medicaid Other

## 2012-12-28 ENCOUNTER — Telehealth: Payer: Self-pay | Admitting: Vascular Surgery

## 2012-12-28 ENCOUNTER — Encounter (HOSPITAL_COMMUNITY): Payer: Medicaid Other | Admitting: Occupational Therapy

## 2012-12-28 DIAGNOSIS — I739 Peripheral vascular disease, unspecified: Secondary | ICD-10-CM

## 2012-12-28 DIAGNOSIS — I10 Essential (primary) hypertension: Secondary | ICD-10-CM

## 2012-12-28 DIAGNOSIS — N189 Chronic kidney disease, unspecified: Secondary | ICD-10-CM

## 2012-12-28 DIAGNOSIS — L98499 Non-pressure chronic ulcer of skin of other sites with unspecified severity: Secondary | ICD-10-CM

## 2012-12-28 DIAGNOSIS — S88119A Complete traumatic amputation at level between knee and ankle, unspecified lower leg, initial encounter: Secondary | ICD-10-CM

## 2012-12-28 LAB — BASIC METABOLIC PANEL
BUN: 52 mg/dL — ABNORMAL HIGH (ref 6–23)
CO2: 29 mEq/L (ref 19–32)
Chloride: 101 mEq/L (ref 96–112)
Creatinine, Ser: 2.24 mg/dL — ABNORMAL HIGH (ref 0.50–1.10)
Glucose, Bld: 105 mg/dL — ABNORMAL HIGH (ref 70–99)

## 2012-12-28 MED ORDER — TRAVOPROST (BAK FREE) 0.004 % OP SOLN: 1.0000 [drp] | Freq: Every day | OPHTHALMIC | Status: DC

## 2012-12-28 MED ORDER — OXYCODONE-ACETAMINOPHEN 5-325 MG PO TABS: 1.0000 | ORAL_TABLET | ORAL | Status: DC | PRN

## 2012-12-28 MED ORDER — CARVEDILOL 12.5 MG PO TABS: 12.5000 mg | ORAL_TABLET | Freq: Two times a day (BID) | ORAL | Status: DC

## 2012-12-28 MED ORDER — ISOSORBIDE MONONITRATE ER 60 MG PO TB24: 60.0000 mg | ORAL_TABLET | Freq: Every day | ORAL | Status: DC

## 2012-12-28 MED ORDER — LISINOPRIL 20 MG PO TABS: 20.0000 mg | ORAL_TABLET | Freq: Every day | ORAL | Status: DC

## 2012-12-28 MED ORDER — OXYCODONE-ACETAMINOPHEN 5-325 MG PO TABS: ORAL_TABLET | ORAL | Status: DC

## 2012-12-28 MED ORDER — VITAMIN D (ERGOCALCIFEROL) 1.25 MG (50000 UNIT) PO CAPS: 50000.0000 [IU] | ORAL_CAPSULE | ORAL | Status: DC

## 2012-12-28 MED ORDER — GABAPENTIN 300 MG PO CAPS: 300.0000 mg | ORAL_CAPSULE | Freq: Three times a day (TID) | ORAL | Status: DC

## 2012-12-28 MED ORDER — PANTOPRAZOLE SODIUM 40 MG PO TBEC: 40.0000 mg | DELAYED_RELEASE_TABLET | Freq: Every day | ORAL | Status: DC

## 2012-12-28 MED ORDER — ROSUVASTATIN CALCIUM 10 MG PO TABS: 10.0000 mg | ORAL_TABLET | Freq: Every day | ORAL | Status: DC

## 2012-12-28 MED ORDER — PREDNISOLONE ACETATE 1 % OP SUSP: 1.0000 [drp] | Freq: Four times a day (QID) | OPHTHALMIC | Status: DC

## 2012-12-28 NOTE — Telephone Encounter (Addendum)
Message copied by Lujean Amel on Tue Dec 28, 2012  3:49 PM ------      Message from: Denman George      Created: Tue Dec 28, 2012  2:56 PM      Regarding: FW: follow up and list       4 wk f/u with [facility] please       ----- Message -----         From: Angelia Mould, MD         Sent: 12/28/2012   1:28 PM           To: Lynetta Mare Pullins, RN      Subject: follow up and list                                       This patient was discharged. She had her left BKA on 12/16/2012. She needs to be seen in approximately 4 weeks postop for staple removal. Thank you.CD       ------ I scheduled an appt for the above pt on 02/02/13 at 11:30am w/ [facility]. I mailed an appt letter and also lm for pt at her hm ph#/awt

## 2012-12-28 NOTE — Progress Notes (Signed)
1210hrs: Pt discharged from unit to home, accompanied by her sister.

## 2012-12-28 NOTE — Progress Notes (Signed)
Social Work  Discharge Note  The overall goal for the admission was met for:   Discharge location: Yes - home with sister providing intermittent assist  Length of Stay: Yes  Discharge activity level: Yes - supervision overall  Home/community participation: Yes  Services provided included: MD, RD, PT, OT, RN, TR, Pharmacy, Neuropsych and SW  Financial Services: Medicaid  Follow-up services arranged: Home Health: RN, PT via [facility], DME: additions to w/c = amputee support pad and cushion via [place] and Patient/Family has no preference for HH/DME agencies  Comments (or additional information):  Patient/Family verbalized understanding of follow-up arrangements: Yes  Individual responsible for coordination of the follow-up plan: patient  Confirmed correct DME delivered: Donnisha Besecker 12/28/2012    Lavayah Vita

## 2012-12-28 NOTE — Progress Notes (Signed)
Subjective/Complaints: Ready to go home!Marland Kitchen A 12 point review of systems has been performed and if not noted above is otherwise negative.  Objective: Vital Signs: Blood pressure 137/49, pulse 69, temperature 97.8 F (36.6 C), temperature source Oral, resp. rate 18, height 5\' 2"  (1.575 m), weight 68.947 kg (152 lb), SpO2 98.00%. No results found. No results found for this basename: WBC, HGB, HCT, PLT,  in the last 72 hours  Recent Labs  12/27/12 0500 12/28/12 0630  NA 139 138  K 5.4* 5.1  CL 101 101  GLUCOSE 98 105*  BUN 49* 52*  CREATININE 2.04* 2.24*  CALCIUM 10.1 9.8   CBG (last 3)   Recent Labs  12/28/12 0712  GLUCAP 96    Wt Readings from Last 3 Encounters:  12/22/12 68.947 kg (152 lb)  12/16/12 65.2 kg (143 lb 11.8 oz)  12/16/12 65.2 kg (143 lb 11.8 oz)    Physical Exam:    HENT:  Head: Normocephalic.  Eyes: EOM are normal.  Neck: Neck supple. No thyromegaly present.  Cardiovascular: Normal rate and regular rhythm.  Pulmonary/Chest:  Decreased breath sounds at the bases but clear to auscultation  Abdominal: Bowel sounds are normal. She exhibits no distension.  Musculoskeletal:  Left BK still somewhat tender. Wound c/d/i. Neurological:  Patient awakens when physically stimulated.   She follows simple commands.   No CN findings. Mild PP and LT sensory deficits right foot. Intrinsic minus foot . UE's near 4+ to 5/5 prox to distal. LLE has full AROM at hip and knee which grade out 3+ to 4. RLE is 4 prox at HF to 4+ distally at ankle. DTR's are 1+   Assessment/Plan: 1. Functional deficits secondary to left BKA which require 3+ hours per day of interdisciplinary therapy in a comprehensive inpatient rehab setting. Physiatrist is providing close team supervision and 24 hour management of active medical problems listed below. Physiatrist and rehab team continue to assess barriers to discharge/monitor patient progress toward functional and medical  goals.    FIM: FIM - Bathing Bathing Steps Patient Completed: Chest;Right Arm;Left Arm;Abdomen;Front perineal area;Buttocks;Right upper leg;Left upper leg;Right lower leg (including foot) Bathing: 0: Activity did not occur  FIM - Upper Body Dressing/Undressing Upper body dressing/undressing steps patient completed: Thread/unthread right bra strap;Thread/unthread left bra strap;Hook/unhook bra;Thread/unthread right sleeve of pullover shirt/dresss;Thread/unthread left sleeve of pullover shirt/dress;Put head through opening of pull over shirt/dress;Pull shirt over trunk;Thread/unthread right sleeve of front closure shirt/dress Upper body dressing/undressing: 0: Activity did not occur FIM - Lower Body Dressing/Undressing Lower body dressing/undressing steps patient completed: Thread/unthread right underwear leg;Thread/unthread left underwear leg;Pull underwear up/down;Thread/unthread right pants leg;Thread/unthread left pants leg;Pull pants up/down;Don/Doff right sock Lower body dressing/undressing: 0: Activity did not occur  FIM - Toileting Toileting steps completed by patient: Adjust clothing prior to toileting;Performs perineal hygiene;Adjust clothing after toileting Toileting Assistive Devices: Grab bar or rail for support Toileting: 6: Assistive device: No helper  FIM - Radio producer Devices: Product manager Transfers: 0-Activity did not occur  FIM - Control and instrumentation engineer Devices: Copy: 3: Bed > Chair or W/C: Mod A (lift or lower assist);3: Chair or W/C > Bed: Mod A (lift or lower assist)  FIM - Locomotion: Wheelchair Distance: 150 Locomotion: Wheelchair: 5: Travels 150 ft or more: maneuvers on rugs and over door sills with supervision, cueing or coaxing FIM - Locomotion: Ambulation Locomotion: Ambulation Assistive Devices: Administrator Ambulation/Gait Assistance: 4: Min guard Locomotion: Ambulation:  1: [employer]  less than 50 ft with minimal assistance (Pt.>75%)  Comprehension Comprehension Mode: Auditory Comprehension: 5-Follows basic conversation/direction: With extra time/assistive device  Expression Expression Mode: Verbal Expression: 5-Expresses basic 90% of the time/requires cueing < 10% of the time.  Social Interaction Social Interaction Mode: Not assessed Social Interaction: 4-Interacts appropriately 75 - 89% of the time - Needs redirection for appropriate language or to initiate interaction.  Problem Solving Problem Solving Mode: Not assessed Problem Solving: 5-Solves basic 90% of the time/requires cueing < 10% of the time  Memory Memory Mode: Not assessed Memory: 5-Recognizes or recalls 90% of the time/requires cueing < 10% of the time  Medical Problem List and Plan:  1. left below-knee amputation secondary to peripheral vascular disease 12/16/2012  2. DVT Prophylaxis/Anticoagulation: Subcutaneous Lovenox. Monitor platelet counts and any signs of bleeding  3. Pain Management: Neurontin 300 mg 3 times a day, Percocet 1-2 tablets every 4 hours as needed pain.   -fair control at present 4. Neuropsych: This patient is capable of making decisions on his/her own behalf.  5. Acute blood loss anemia. Patient has been transfused with latest hemoglobin 9.4. Followup CBC  6. Hypertension. Coreg increased to 12.5mg  twice a day, Lasix 80 mg daily (hold), Imdur 60 mg daily, lisinopril 20 mg daily as of today. Follow for improvement with recent changes 7. Diet controlled diabetes mellitus. Latest hemoglobin A1c 5.4. Sugars under control 8. Chronic renal insufficiency. Baseline creatinine 1.89. Encourage fluids.   -hold lasix today  -hold potassium  -recheck bun/cr via [place] on thursday 9. History of tobacco abuse. Counseling   LOS (Days) 8 A FACE TO FACE EVALUATION WAS PERFORMED  Vernor Monnig T 12/28/2012 8:03 AM

## 2012-12-28 NOTE — Discharge Summary (Signed)
[name] [name], [name] [name] NO.:  [id number]  MEDICAL RECORD NO.:  [id number]  LOCATION:  [id number]                         FACILITY:  [facility]  PHYSICIAN:  Meredith Staggers, M.D.DATE OF BIRTH:  1951-07-25  DATE OF ADMISSION:  12/20/2012 DATE OF DISCHARGE:  12/28/2012                              DISCHARGE SUMMARY   DISCHARGE DIAGNOSES: 1. Left below-knee amputation secondary to peripheral vascular     disease, December 16, 2012, subcutaneous Lovenox for DVT prophylaxis,     pain management. 2. Acute blood loss anemia. 3. Hypertension. 4. Diet-controlled diabetes mellitus. 5. Chronic renal insufficiency with baseline creatinine 1.89. 6. History of tobacco abuse.  HISTORY OF PRESENT ILLNESS:  This is a 62 year old right-handed female with history of tobacco abuse, peripheral vascular disease with revascularization procedures in the past.  Admitted on December 16, 2012, with nonhealing left foot wound and findings of severe arterial occlusive disease as well as MRSA infection of the left foot.  Limb was not felt to be salvageable and underwent left below-knee amputation on December 16, 2012, per Dr. Scot Dock.  Postoperative pain control.  Placed on subcutaneous Lovenox for DVT prophylaxis.  Acute blood loss anemia of 7.2 and transfused.  MRSA precautions were discontinued after amputation.  Physical and occupational therapy ongoing.  Patient was added for admitted for comprehensive rehab program.  PAST MEDICAL HISTORY:  See discharge diagnoses.  SOCIAL HISTORY:  Lives with friends with assistance as needed.  FUNCTIONAL HISTORY:  Prior to admission, she was on disability.  She was driving short distances.  Functional status upon admission to rehab services, minimal assist to ambulate 20 feet with a rolling walker.  PHYSICAL EXAMINATION:  VITAL SIGNS:  Blood pressure 159/79, pulse 69, temperature 98, respirations 16. GENERAL:  This is an alert female, in no acute distress,  oriented x3. HEENT:  Pupils reactive to light. LUNGS:  Clear to auscultation. CARDIAC:  Rate controlled. ABDOMEN:  Soft, nontender.  Good bowel sounds. EXTREMITIES:  ACE wrap to left lower extremity.  REHABILITATION HOSPITAL COURSE:  The patient was admitted to inpatient rehab services with therapies initiated on a 3-hour daily basis consisting of physical therapy, occupational therapy, and 24-hour rehabilitation nursing.  The following issues were addressed during the patient's rehabilitation stay pertaining to Ms. Baskette's left below- knee amputation secondary to peripheral vascular disease.  Surgical site healing nicely.  She would follow up in the [facility] as well as Dr. Scot Dock from Vascular Surgery.  Subcutaneous Lovenox ongoing for DVT prophylaxis.  Pain managed with use of Neurontin 300 mg 3 times daily as well as Percocet as needed.  Acute blood loss anemia, stable with latest hemoglobin of 10.9 and monitored.  Blood pressures controlled with Coreg as well as Lasix with Imdur and low-dose lisinopril.  She would follow with the primary care doctor.  She did have a history of diet-controlled diabetes mellitus.  Hemoglobin A1c of 5.4.  Chronic renal insufficiency, baseline creatinine 1.89 and mildly elevated 2.24 on 12/28/2012. Her Lasix was held with plan to check chemistries 12/30/2012 and resume Lasix at half dose his renal function improved. She did have a history of tobacco abuse, received counseling in regard to  cessation of nicotine products. It was questionable if she would be compliant with these request.  The patient received weekly collaborative interdisciplinary team conferences to discuss estimated length of stay, family teaching, and any barriers to discharge.  She was continent of bowel and bladder.  Overall minimal assist for activities of daily living as well as functional mobility, ambulating 25 feet x2 with a rolling walker, bed mobility  independent. Family teaching completed with friends and discharged to home.  DISCHARGE MEDICATIONS:  Lipitor 20 mg p.o. daily, Coreg 12.5 mg p.o. b.i.d., Lasix 80 mg p.o. daily, Neurontin 300 mg t.i.d., Imdur 60 mg daily, lisinopril 20 mg p.o. daily, Percocet 1-2 tablets every 4 hours as needed for pain dispensed 90 tablets, Protonix 40 mg daily, potassium chloride 20 mEq p.o. daily, vitamin D 50,000 units every Monday, Travatan ophthalmic solution 1 drop both eyes at bedtime.  DIET:  Diabetic diet.  SPECIAL INSTRUCTIONS:  The patient would follow up with Dr. [name], Vascular Surgery, in 2 weeks, call for appointment; Dr. Kerry Dory at the outpatient rehab center on January 25, 2013; Dr. Burnard Bunting, medical management.  Ongoing therapies were dictated as per rehab services.   Plan to check BMET 12/30/2012 with results to Dr. Alger Simons. Continue to hold Lasix for now until followup chemistries returned  Lauraine Rinne, P.A.   ______________________________ Meredith Staggers, M.D.    DA/MEDQ  D:  12/27/2012  T:  12/28/2012  Job:  [id number]  cc:   Fransisca Kaufmann

## 2012-12-28 NOTE — Progress Notes (Signed)
[facility]  Subjective  -   Patient is going home today.  She is independent with WC mobility.   Objective Patient Vitals for the past 24 hrs:  BP Temp Temp src Pulse Resp SpO2  12/28/12 0807 140/60 mmHg - - - - -  12/28/12 0547 137/49 mmHg 97.8 F (36.6 C) - 69 18 98 %  12/27/12 2015 146/76 mmHg 98.3 F (36.8 C) Oral 64 18 97 %  12/27/12 0927 178/66 mmHg - - - - -    Intake/Output Summary (Last 24 hours) at 12/28/12 0909 Last data filed at 12/28/12 0800  Gross per 24 hour  Intake    840 ml  Output      0 ml  Net    840 ml   Her staples are intact and incision is healing well.    Assessment/Planning: S/P left BKA F/U with Dr. Scot Dock in 2 weeks for staple removal.    Theda Sers, Yamil Oelke [facility] 12/28/2012 9:09 AM --  Laboratory CBC    Component Value Date/Time   WBC 8.8 12/21/2012 0600   HGB 10.4* 12/21/2012 0600   HCT 31.6* 12/21/2012 0600   PLT 226 12/21/2012 0600    BMET    Component Value Date/Time   NA 138 12/28/2012 0630   K 5.1 12/28/2012 0630   CL 101 12/28/2012 0630   CO2 29 12/28/2012 0630   GLUCOSE 105* 12/28/2012 0630   BUN 52* 12/28/2012 0630   CREATININE 2.24* 12/28/2012 0630   CREATININE 1.89* 11/10/2012 1115   CALCIUM 9.8 12/28/2012 0630   CALCIUM 9.3 08/28/2011 1648   GFRNONAA 23* 12/28/2012 0630   GFRAA 26* 12/28/2012 0630    COAG Lab Results  Component Value Date   INR 1.06 12/09/2012   INR 1.07 12/09/2012   INR 1.16 11/03/2012   No results found for this basename: PTT    Antibiotics Anti-infectives   None

## 2012-12-28 NOTE — Progress Notes (Signed)
Occupational Therapy Session Note & Discharge Summary  Patient Details  Name: [name] [name] MRN: [id number] Date of Birth: Nov 07, 1950  Today's Date: 12/28/2012  SESSION NOTE Time: 0730-0830 Time Calculation (min): 60 min Patient found supine in bed with no complaints of pain. Patient sat edge of bed and completed breakfast. Patient then performed edge of bed -> w/c squat pivot transfer and engaged in ADL retraining at sink level. Patient completed ADL at an overall supervision level, except independent for UB dressing and grooming tasks at sink. Patient also completed toilet transfer and toileting; patient is modified independent for toilet transfer and toileting. At end of session left patient seated in w/c for next therapy session.   ----------------------------------------------------------------------------------------------------------------------  DISCHARGE SUMMARY  Patient has met 10 of 10 long term goals due to improved activity tolerance, improved balance, postural control, ability to compensate for deficits, functional use of  RIGHT lower extremity, improved attention, improved awareness and improved coordination.  Patient to discharge at overall supervision -> modified independent level level.  No family has been present for education. Recommending patient have supervision for standing tasks, but patient is mod I for basic w/c transfers (squat pivot).   Reasons goals not met: n/a, all goals met at this time.  Recommendation:  Patient will benefit from ongoing skilled OT services in home health setting to continue to advance functional skills in the area of BADL, iADL and Reduce care partner burden.  Equipment: No equipment provided, patient states she has all needed equipment; BSC and shower seat/bench.   Reasons for discharge: treatment goals met and discharge from hospital  Patient/family agrees with progress made and goals achieved: Yes  Precautions/Restrictions   Precautions Precautions: ICD/Pacemaker;Fall Restrictions Weight Bearing Restrictions: Yes LLE Weight Bearing: Non weight bearing  Vital Signs Therapy Vitals Temp: 97.8 F (36.6 C) Pulse Rate: 69 (74 sitting and 64 standing) Resp: 18 BP: 140/60 mmHg Patient Position, if appropriate: Lying Oxygen Therapy SpO2: 98 %  Pain Pain Assessment Pain Assessment: No/denies pain Pain Score: 0-No pain Faces Pain Scale: No hurt  ADL - See FIM  Vision/Perception  Vision - History Baseline Vision: Wears glasses only for reading Visual History: Cataracts Patient Visual Report: No change from baseline Vision - Assessment Eye Alignment: Within Functional Limits Perception Perception: Within Functional Limits Praxis Praxis: Intact   Cognition Overall Cognitive Status: Appears within functional limits for tasks assessed Arousal/Alertness: Awake/alert Orientation Level: Oriented X4 Memory: Appears intact Awareness: Appears intact Problem Solving: Appears intact Safety/Judgment: Appears intact  Sensation Sensation Additional Comments: bilateral UEs appear intact Coordination Gross Motor Movements are Fluid and Coordinated: Yes Fine Motor Movements are Fluid and Coordinated: Yes  Motor  Motor Motor: Within Functional Limits  Mobility - See Discharge Navigator  Trunk/Postural Assessment  Cervical Assessment Cervical Assessment: Within Functional Limits Thoracic Assessment Thoracic Assessment: Within Functional Limits Lumbar Assessment Lumbar Assessment: Within Functional Limits Postural Control Postural Control: Within Functional Limits   Balance- See Discharge Navigator  Extremity/Trunk Assessment RUE Assessment RUE Assessment: Within Functional Limits LUE Assessment LUE Assessment: Exceptions to [facility] LUE AROM (degrees) LUE Overall AROM Comments: decreased shoulder AROM, flexion =~90* (patient reports fracture years ago), elbow AROM WFL LUE PROM (degrees) LUE  Overall PROM Comments: decreased shoulder AROM, patient reports pain when shoulder is passively ranged, elbow PROM WFL LUE Strength LUE Overall Strength Comments: shoulder decreased, elbow 5/5 strength  See FIM for current functional status  Nate Perri 12/28/2012, 8:34 AM

## 2012-12-29 ENCOUNTER — Telehealth: Payer: Self-pay

## 2012-12-29 NOTE — Consult Note (Signed)
12/27/12  PSYCHOSOCIAL NOTE - CONFIDENTIAL  [facility]   Mrs. [name] [name] was seen on the [facility].  She was admitted to the unit owing to functional deficits secondary to left BKA. A neuropsychology consult was ordered to assess her current emotional status subsequent to her present medical status and to assist with treatment planning.  Mrs. [name] [name] reported suffering from significant depression and anxiety owing to her present medical situation. She denied ever being treated for emotional factors in the past. She described her current mood as "up and down." She was encouraged that she is being discharged tomorrow. She is being discharged home and upon query she said that she has a neighbor that she can rely on. She was also willing to participate in psychotherapy if the provider could see her at home.   Mrs. [name] [name] was not happy that her television program was being interrupted by this provider. As such, the appointment was cut short and most of my follow-up was done with the social worker.  Plan:    Attempt to set Mrs. [name] [name] up with in-home psychiatric care  Greater than 50% of this visit was spent educating the patient about the possible diagnosis, prognosis, management plan, and in coordination of care.   REFERRING DIAGNOSIS: BKA  FINAL DIAGNOSES:  BKA Adjustment disorder with depression (moderate)  Total professional time including medical record review and feedback equals 4 units [id number]).   Rutha Bouchard, Psy.D.  Clinical Neuropsychologist

## 2012-12-29 NOTE — Telephone Encounter (Signed)
[name] [name] with [facility] called to say patient has been accepted into there care.

## 2013-01-05 ENCOUNTER — Encounter: Payer: Self-pay | Admitting: Internal Medicine

## 2013-01-05 ENCOUNTER — Ambulatory Visit (INDEPENDENT_AMBULATORY_CARE_PROVIDER_SITE_OTHER): Payer: Medicaid Other | Admitting: Internal Medicine

## 2013-01-05 VITALS — BP 125/68 | HR 99 | Temp 99.0°F | Wt 135.1 lb

## 2013-01-05 DIAGNOSIS — N189 Chronic kidney disease, unspecified: Secondary | ICD-10-CM

## 2013-01-05 DIAGNOSIS — E1129 Type 2 diabetes mellitus with other diabetic kidney complication: Secondary | ICD-10-CM

## 2013-01-05 DIAGNOSIS — F4323 Adjustment disorder with mixed anxiety and depressed mood: Secondary | ICD-10-CM | POA: Insufficient documentation

## 2013-01-05 DIAGNOSIS — E1122 Type 2 diabetes mellitus with diabetic chronic kidney disease: Secondary | ICD-10-CM

## 2013-01-05 DIAGNOSIS — S88119A Complete traumatic amputation at level between knee and ankle, unspecified lower leg, initial encounter: Secondary | ICD-10-CM

## 2013-01-05 DIAGNOSIS — N183 Chronic kidney disease, stage 3 unspecified: Secondary | ICD-10-CM

## 2013-01-05 DIAGNOSIS — Z89512 Acquired absence of left leg below knee: Secondary | ICD-10-CM

## 2013-01-05 DIAGNOSIS — I10 Essential (primary) hypertension: Secondary | ICD-10-CM

## 2013-01-05 DIAGNOSIS — N179 Acute kidney failure, unspecified: Secondary | ICD-10-CM

## 2013-01-05 LAB — BASIC METABOLIC PANEL
BUN: 41 mg/dL — ABNORMAL HIGH (ref 6–23)
CO2: 25 mEq/L (ref 19–32)
Chloride: 107 mEq/L (ref 96–112)
Glucose, Bld: 99 mg/dL (ref 70–99)
Potassium: 5.6 mEq/L — ABNORMAL HIGH (ref 3.5–5.3)

## 2013-01-05 MED ORDER — PAROXETINE HCL 10 MG PO TABS: 10.0000 mg | ORAL_TABLET | Freq: Every day | ORAL | Status: DC
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
## 2013-01-05 NOTE — Patient Instructions (Signed)
General Instructions: - we will start Paxil 10mg  every night for your mood.  -Next time we will discuss your mammogram  Please be sure to bring all of your medications with you to every visit.  Should you have any new or worsening symptoms, please be sure to call the clinic at [phone].    Treatment Goals:  Goals (1 Years of Data) as of 01/05/13         As of Today 12/28/12 12/28/12 12/27/12 12/27/12     Blood Pressure    . Blood Pressure < 140/90  125/68 140/60 137/49 146/76 178/66     Result Component    . HEMOGLOBIN A1C < 7.0          . LDL CALC < 100            Progress Toward Treatment Goals:  Treatment Goal 01/05/2013  Blood pressure at goal  Stop smoking smoking less    Self Care Goals & Plans:  Self Care Goal 01/05/2013  Manage my medications take my medicines as prescribed  Monitor my health -  Eat healthy foods eat foods that are low in salt; eat baked foods instead of fried foods  Stop smoking go to the [employer] ([email or web address]    Home Blood Glucose Monitoring 01/05/2013  Check my blood sugar no home glucose monitoring  When to check my blood sugar N/A     Care Management & Community Referrals:  Referral 01/05/2013  Referrals made for care management support social worker
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
## 2013-01-05 NOTE — Assessment & Plan Note (Signed)
Patient having difficulty adjusting, but continues to work with physical therapy, she is in the process of being fitted for her prosthetic leg. Given her difficulties, I will refer her to social work to see if there is any sort of support group that she could take part in.

## 2013-01-05 NOTE — Assessment & Plan Note (Signed)
BP Readings from Last 3 Encounters:  01/05/13 125/68  12/28/12 140/60  12/20/12 144/56    Lab Results  Component Value Date   NA 138 12/28/2012   K 5.1 12/28/2012   CREATININE 2.24* 12/28/2012    Assessment: Blood pressure control: controlled Progress toward BP goal:  at goal  Plan: Medications:  continue current medications - carvedilol 12.5 mg twice daily, Imdur 60 mg daily, lisinopril 20 mg daily (today we will check renal function, his creatinine is continuing to rise, we will need to hold this medication; she has also been on Lasix that has been held at hospital discharge) Educational resources provided: brochure;video

## 2013-01-05 NOTE — Assessment & Plan Note (Addendum)
Recheck renal function today, as creatinine= 2.24, GFR=26 on 12/28/2012, and the trend was rising. We'll need to see if she was seen by nephrology. She does not report any problems with urination. May need to hold ACE inhibitor if renal function has continued to deteriorate.  ADDENDUM: K= 5.6, Cr = 2.3, ?new baseline Will d/c lisinopril (though on phone with patient on 01/06/13 at 7:15p, patient denies taking lisinopril) and restart lasix 40mg  daily and recheck BMET in 3-4 days.  Will try to get patient set up with [facility] for homr lab draws and further support given LBKA

## 2013-01-06 ENCOUNTER — Encounter: Payer: Self-pay | Admitting: Licensed Clinical Social Worker

## 2013-01-06 ENCOUNTER — Telehealth: Payer: Self-pay | Admitting: Licensed Clinical Social Worker

## 2013-01-06 MED ORDER — FUROSEMIDE 40 MG PO TABS: 40.0000 mg | ORAL_TABLET | Freq: Every day | ORAL | Status: DC

## 2013-01-06 NOTE — Telephone Encounter (Signed)
[name] [name] is s/p recent BKA and voiced issues with adjusting with the amputation and lifestyle.  Pt was referred to CSW for referral to mental health resources/support groups.  Same concerns were voiced during hospitalization following surgery.  Pt was referred to Neuropsychology at that time.  CSW placed call to [name] to discuss resources available in the area including Neuropsychology referral and support groups.  Pt states at this time she is "doing okay, I have my family".  Pt states she most likely would not attend a support group and did not feel the referral for Neuropsych is needed at this time.  CSW offered to send [name] information on support groups and Neuropsych so that when pt is ready, she will have the information.  Pt agreeable.  Pt denies add'l needs at this time and is aware CSW is available to assist as needed.

## 2013-01-06 NOTE — Progress Notes (Signed)
Patient ID: [name] [name], female   DOB: 1951/01/26, 62 y.o.   MRN: [id number] See CSW telephone note 01/06/13

## 2013-01-06 NOTE — Addendum Note (Signed)
Addended by: Othella Boyer on: 01/06/2013 07:20 PM   Modules accepted: Orders, Medications

## 2013-01-07 ENCOUNTER — Telehealth: Payer: Self-pay | Admitting: Licensed Clinical Social Worker

## 2013-01-07 ENCOUNTER — Encounter: Payer: Self-pay | Admitting: Licensed Clinical Social Worker

## 2013-01-07 NOTE — Telephone Encounter (Signed)
[name] [name] has referral for [facility].  CSW placed call to [name] [name] to obtain pt's choice for home health services.  Pt states she currently has a nurse that helps her clean.  CSW explained that PCP currently would like pt to have skilled nursing services for possible need of frequent blood draws and difficulty ambulating in the home due to recent BKA.  Pt in agreement as long as will not affect her PCS.  CSW explained these will be two different agencies as current [facility], does not provide skilled services.  Pt in agreement with referral for [facility] RN to [facility].  Referral to [facility] completed.

## 2013-01-11 ENCOUNTER — Telehealth: Payer: Self-pay | Admitting: *Deleted

## 2013-01-11 NOTE — Telephone Encounter (Signed)
CSW received information from [facility] that Ms. Darius was active with [place].  CSW placed call to [name].  Pt states [facility] RN just left but pt could not remember the name of the agency and could not state if [place] was familiar.  CSW informed Ms. Boye, CSW could contact [facility] and confirm.  CSW discussed services available through [facility], pt declines at this time.  CSW placed call to [facility].  Pt is active with [facility], referral faxed.

## 2013-01-11 NOTE — Telephone Encounter (Signed)
[facility] was unable to accept pt as a client because she is already active with [place] [phone]).  Call made to [facility] to place order for lab draw.  Joyce,RN was already at the pt's home and was going to attempt venipuncture, when she realized her tubes were expired.  [facility] has already made arrangements to have lab drawn tomorrow, with results being faxed to our office.Regenia Skeeter, Darlene Cassady4/15/201411:36 AM

## 2013-01-12 ENCOUNTER — Other Ambulatory Visit: Payer: Self-pay | Admitting: Internal Medicine

## 2013-01-12 DIAGNOSIS — N189 Chronic kidney disease, unspecified: Secondary | ICD-10-CM

## 2013-01-25 ENCOUNTER — Telehealth: Payer: Self-pay | Admitting: Vascular Surgery

## 2013-01-25 ENCOUNTER — Encounter: Payer: Self-pay | Admitting: Physical Medicine & Rehabilitation

## 2013-01-25 ENCOUNTER — Encounter: Payer: Medicaid Other | Attending: Physical Medicine & Rehabilitation | Admitting: Physical Medicine & Rehabilitation

## 2013-01-25 ENCOUNTER — Telehealth: Payer: Self-pay

## 2013-01-25 VITALS — BP 133/73 | HR 65 | Resp 15 | Ht 62.0 in | Wt 130.0 lb

## 2013-01-25 DIAGNOSIS — S88119A Complete traumatic amputation at level between knee and ankle, unspecified lower leg, initial encounter: Secondary | ICD-10-CM

## 2013-01-25 DIAGNOSIS — N179 Acute kidney failure, unspecified: Secondary | ICD-10-CM

## 2013-01-25 DIAGNOSIS — N183 Chronic kidney disease, stage 3 unspecified: Secondary | ICD-10-CM

## 2013-01-25 DIAGNOSIS — I739 Peripheral vascular disease, unspecified: Secondary | ICD-10-CM

## 2013-01-25 DIAGNOSIS — L98499 Non-pressure chronic ulcer of skin of other sites with unspecified severity: Secondary | ICD-10-CM

## 2013-01-25 DIAGNOSIS — L97509 Non-pressure chronic ulcer of other part of unspecified foot with unspecified severity: Secondary | ICD-10-CM | POA: Insufficient documentation

## 2013-01-25 DIAGNOSIS — E1122 Type 2 diabetes mellitus with diabetic chronic kidney disease: Secondary | ICD-10-CM

## 2013-01-25 DIAGNOSIS — E1129 Type 2 diabetes mellitus with other diabetic kidney complication: Secondary | ICD-10-CM

## 2013-01-25 DIAGNOSIS — E1169 Type 2 diabetes mellitus with other specified complication: Secondary | ICD-10-CM | POA: Insufficient documentation

## 2013-01-25 DIAGNOSIS — Z89512 Acquired absence of left leg below knee: Secondary | ICD-10-CM

## 2013-01-25 DIAGNOSIS — G547 Phantom limb syndrome without pain: Secondary | ICD-10-CM | POA: Insufficient documentation

## 2013-01-25 DIAGNOSIS — N189 Chronic kidney disease, unspecified: Secondary | ICD-10-CM

## 2013-01-25 MED ORDER — OXYCODONE-ACETAMINOPHEN 5-325 MG PO TABS: 1.0000 | ORAL_TABLET | Freq: Four times a day (QID) | ORAL | Status: DC | PRN

## 2013-01-25 NOTE — Telephone Encounter (Signed)
Message copied by Berniece Salines on Tue Jan 25, 2013 11:34 AM ------      Message from: Denman George      Created: Mon Jan 24, 2013  2:57 PM      Regarding: f/u appt. with [facility]       Jenny Reichmann, nurse w/ [facility] called to question about f/u appt.  I see pt. Has f/u on 5/7 with Dr. Scot Dock.  Her amputation was done on 12/16/12; can we move her appt. To 4/30//14, since she is over 1 month post-op?  Call [facility] at [phone].  ------

## 2013-01-25 NOTE — Patient Instructions (Signed)
CALL ME WITH ANY PROBLEMS OR QUESTIONS (#[phone]).  HAVE A GOOD DAY  

## 2013-01-25 NOTE — Telephone Encounter (Signed)
Debbie with [facility] called to get verbal order to continue therapy.  Verbal given.

## 2013-01-25 NOTE — Telephone Encounter (Signed)
lvm for pt to call to confirm appt, spoke with lisa williams nurse's aide, she will contact pt re appt change - kf
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
## 2013-01-26 ENCOUNTER — Encounter: Payer: Self-pay | Admitting: Vascular Surgery

## 2013-01-26 ENCOUNTER — Ambulatory Visit (INDEPENDENT_AMBULATORY_CARE_PROVIDER_SITE_OTHER): Payer: Medicaid Other | Admitting: Vascular Surgery

## 2013-01-26 VITALS — BP 121/73 | HR 64 | Resp 16 | Ht 62.0 in | Wt 126.0 lb

## 2013-01-26 DIAGNOSIS — I739 Peripheral vascular disease, unspecified: Secondary | ICD-10-CM

## 2013-01-26 DIAGNOSIS — Z48812 Encounter for surgical aftercare following surgery on the circulatory system: Secondary | ICD-10-CM

## 2013-01-26 DIAGNOSIS — L98499 Non-pressure chronic ulcer of skin of other sites with unspecified severity: Secondary | ICD-10-CM

## 2013-01-26 NOTE — Addendum Note (Signed)
Addended by: Dorthula Rue L on: 01/26/2013 04:28 PM   Modules accepted: Orders

## 2013-01-26 NOTE — Progress Notes (Signed)
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  HPI: [name] [name] is a 62 y.o. female who presented with a gangrenous left foot. She a functioning left femoropopliteal bypass graft with severe tibial artery occlusive disease below that. She required left below the knee amputation. She is now 1 month postop. Her staples were removed yesterday. She has no specific complaints and his had no fever. She's had no erythema or drainage. She's had no wounds on her right foot.   REVIEW OF SYSTEMS: Valu.Nieves ] denotes positive finding; [  ] denotes negative finding  CARDIOVASCULAR:  [ ]  chest pain   [ ]  dyspnea on exertion    CONSTITUTIONAL:  [ ]  fever   [ ]  chills  PHYSICAL EXAM: Filed Vitals:   01/26/13 1512  BP: 121/73  Pulse: 64  Resp: 16  Height: 5\' 2"  (1.575 m)  Weight: 126 lb (57.153 kg)  SpO2: 100%   Body mass index is 23.04 kg/(m^2). GENERAL: The patient is a well-nourished female, in no acute distress. The vital signs are documented above. CARDIOVASCULAR: There is a regular rate and rhythm  PULMONARY: There is good air exchange bilaterally without wheezing or rales. Her left below the knee amputation site is healed nicely.  MEDICAL ISSUES: Dr. Naaman Plummer as written her for a left below the knee prosthesis. I'll plan on seeing her back in 6 months with an ABI on the right side we can continue to follow her right lower extremity closely. She knows to call sooner if she has problems.  [facility] [place] Beeper: [phone]

## 2013-01-31 NOTE — Addendum Note (Signed)
Addended by: Orson Gear on: 01/31/2013 09:36 AM   Modules accepted: Orders

## 2013-02-02 ENCOUNTER — Ambulatory Visit: Payer: Medicaid Other | Admitting: Vascular Surgery

## 2013-02-07 ENCOUNTER — Other Ambulatory Visit: Payer: Self-pay | Admitting: *Deleted

## 2013-02-07 DIAGNOSIS — N183 Chronic kidney disease, stage 3 unspecified: Secondary | ICD-10-CM

## 2013-02-08 MED ORDER — FUROSEMIDE 40 MG PO TABS: 40.0000 mg | ORAL_TABLET | Freq: Every day | ORAL | Status: DC

## 2013-02-09 ENCOUNTER — Ambulatory Visit (INDEPENDENT_AMBULATORY_CARE_PROVIDER_SITE_OTHER): Payer: Medicaid Other | Admitting: Internal Medicine

## 2013-02-09 ENCOUNTER — Encounter: Payer: Self-pay | Admitting: Internal Medicine

## 2013-02-09 VITALS — BP 150/72 | HR 59 | Temp 97.6°F | Wt 127.3 lb

## 2013-02-09 DIAGNOSIS — N183 Chronic kidney disease, stage 3 unspecified: Secondary | ICD-10-CM

## 2013-02-09 DIAGNOSIS — E1129 Type 2 diabetes mellitus with other diabetic kidney complication: Secondary | ICD-10-CM

## 2013-02-09 DIAGNOSIS — E1122 Type 2 diabetes mellitus with diabetic chronic kidney disease: Secondary | ICD-10-CM

## 2013-02-09 DIAGNOSIS — F17200 Nicotine dependence, unspecified, uncomplicated: Secondary | ICD-10-CM

## 2013-02-09 DIAGNOSIS — N179 Acute kidney failure, unspecified: Secondary | ICD-10-CM

## 2013-02-09 DIAGNOSIS — N189 Chronic kidney disease, unspecified: Secondary | ICD-10-CM

## 2013-02-09 DIAGNOSIS — I10 Essential (primary) hypertension: Secondary | ICD-10-CM

## 2013-02-09 DIAGNOSIS — Z0000 Encounter for general adult medical examination without abnormal findings: Secondary | ICD-10-CM

## 2013-02-09 DIAGNOSIS — F4323 Adjustment disorder with mixed anxiety and depressed mood: Secondary | ICD-10-CM

## 2013-02-09 LAB — BASIC METABOLIC PANEL
CO2: 20 mEq/L (ref 19–32)
Chloride: 109 mEq/L (ref 96–112)
Creat: 2.26 mg/dL — ABNORMAL HIGH (ref 0.50–1.10)

## 2013-02-09 LAB — POCT GLYCOSYLATED HEMOGLOBIN (HGB A1C): Hemoglobin A1C: 5.3

## 2013-02-09 LAB — GLUCOSE, CAPILLARY: Glucose-Capillary: 92 mg/dL (ref 70–99)

## 2013-02-09 MED ORDER — PAROXETINE HCL 20 MG PO TABS: 20.0000 mg | ORAL_TABLET | Freq: Every day | ORAL | Status: DC

## 2013-02-09 NOTE — Progress Notes (Signed)
Case discussed with Dr. Burnard Bunting (at time of visit, soon after the resident saw the patient).  We reviewed the resident's history and exam and pertinent patient test results.  I agree with the assessment, diagnosis, and plan of care documented in the resident's note.

## 2013-02-09 NOTE — Assessment & Plan Note (Signed)
Continues to smoke. Counseled on importance of smoking cessation

## 2013-02-09 NOTE — Assessment & Plan Note (Addendum)
Recheck renal fxn today, last Cr = 1.7 one month prior.  Still has not had appt with [facility], and is currently overwhelmed with appts.  Will monitor BMET today and decide how urgent this is.

## 2013-02-09 NOTE — Assessment & Plan Note (Signed)
BP Readings from Last 3 Encounters:  02/09/13 150/72  01/26/13 121/73  01/25/13 133/73    Lab Results  Component Value Date   NA 140 01/05/2013   K 5.6* 01/05/2013   CREATININE 2.30* 01/05/2013    Assessment: Blood pressure control: mildly elevated Progress toward BP goal:   deteriorated Comments: currently with pain, first BP this elevated in several visits, continue to monitor, no med changes today  Plan: Medications:  continue current medications - coreg 12.5 BID, lasix 40 daily, imdur 60 daily
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
## 2013-02-09 NOTE — Assessment & Plan Note (Signed)
Patient would like to defer Pap to next appt.

## 2013-02-09 NOTE — Assessment & Plan Note (Signed)
Continues to feel depressed.  Escalate to Paxil 20mg  daily.  She declines therapy at this time.

## 2013-02-09 NOTE — Patient Instructions (Addendum)
General Instructions: -We are going to increase your Paxil (paroxetine) to 20mg  daily  -We are going to check your blood work to monitor your kidney function  -You have to call Dr. Charm Barges office for refills for your pain medication - [phone]  -Your blood pressure is a little high today, we will keep an eye on it.  Please be sure to bring all of your medications with you to every visit.  Should you have any new or worsening symptoms, please be sure to call the clinic at [phone].   Treatment Goals:  Goals (1 Years of Data) as of 02/09/13         As of Today As of Today 01/26/13 01/25/13 01/05/13     Blood Pressure    . Blood Pressure < 140/90  150/72 155/81 121/73 133/73 125/68     Result Component    . HEMOGLOBIN A1C < 7.0  5.3        . LDL CALC < 100            Progress Toward Treatment Goals:  Treatment Goal 02/09/2013  Blood pressure -  Stop smoking smoking less    Self Care Goals & Plans:  Self Care Goal 02/09/2013  Manage my medications take my medicines as prescribed  Monitor my health -  Eat healthy foods -  Be physically active find an activity I enjoy  Stop smoking go to the [place] website (https://scott-booker.info/)    Home Blood Glucose Monitoring 02/09/2013  Check my blood sugar no home glucose monitoring  When to check my blood sugar -    Care Management & Community Referrals:  Referral 01/06/2013  Referrals made for care management support -  Referrals made to community resources support group

## 2013-02-23 ENCOUNTER — Other Ambulatory Visit: Payer: Self-pay | Admitting: Physical Medicine & Rehabilitation

## 2013-02-24 ENCOUNTER — Emergency Department (HOSPITAL_COMMUNITY)
Admission: EM | Admit: 2013-02-24 | Discharge: 2013-02-24 | Disposition: A | Discharge status: Home / Self Care | Payer: Medicaid Other | Attending: Emergency Medicine | Admitting: Emergency Medicine

## 2013-02-24 ENCOUNTER — Emergency Department (HOSPITAL_COMMUNITY): Payer: Medicaid Other

## 2013-02-24 DIAGNOSIS — Z8619 Personal history of other infectious and parasitic diseases: Secondary | ICD-10-CM | POA: Insufficient documentation

## 2013-02-24 DIAGNOSIS — I129 Hypertensive chronic kidney disease with stage 1 through stage 4 chronic kidney disease, or unspecified chronic kidney disease: Secondary | ICD-10-CM | POA: Insufficient documentation

## 2013-02-24 DIAGNOSIS — Y9389 Activity, other specified: Secondary | ICD-10-CM | POA: Insufficient documentation

## 2013-02-24 DIAGNOSIS — G8929 Other chronic pain: Secondary | ICD-10-CM | POA: Insufficient documentation

## 2013-02-24 DIAGNOSIS — S8000XA Contusion of unspecified knee, initial encounter: Secondary | ICD-10-CM | POA: Insufficient documentation

## 2013-02-24 DIAGNOSIS — S8001XA Contusion of right knee, initial encounter: Secondary | ICD-10-CM

## 2013-02-24 DIAGNOSIS — M7989 Other specified soft tissue disorders: Secondary | ICD-10-CM | POA: Insufficient documentation

## 2013-02-24 DIAGNOSIS — M1990 Unspecified osteoarthritis, unspecified site: Secondary | ICD-10-CM | POA: Insufficient documentation

## 2013-02-24 DIAGNOSIS — Z87442 Personal history of urinary calculi: Secondary | ICD-10-CM | POA: Insufficient documentation

## 2013-02-24 DIAGNOSIS — Z79899 Other long term (current) drug therapy: Secondary | ICD-10-CM | POA: Insufficient documentation

## 2013-02-24 DIAGNOSIS — E7800 Pure hypercholesterolemia, unspecified: Secondary | ICD-10-CM | POA: Insufficient documentation

## 2013-02-24 DIAGNOSIS — L97509 Non-pressure chronic ulcer of other part of unspecified foot with unspecified severity: Secondary | ICD-10-CM | POA: Insufficient documentation

## 2013-02-24 DIAGNOSIS — N189 Chronic kidney disease, unspecified: Secondary | ICD-10-CM | POA: Insufficient documentation

## 2013-02-24 DIAGNOSIS — Z950 Presence of cardiac pacemaker: Secondary | ICD-10-CM | POA: Insufficient documentation

## 2013-02-24 DIAGNOSIS — E1169 Type 2 diabetes mellitus with other specified complication: Secondary | ICD-10-CM | POA: Insufficient documentation

## 2013-02-24 DIAGNOSIS — Z8701 Personal history of pneumonia (recurrent): Secondary | ICD-10-CM | POA: Insufficient documentation

## 2013-02-24 DIAGNOSIS — Z862 Personal history of diseases of the blood and blood-forming organs and certain disorders involving the immune mechanism: Secondary | ICD-10-CM | POA: Insufficient documentation

## 2013-02-24 DIAGNOSIS — R0602 Shortness of breath: Secondary | ICD-10-CM | POA: Insufficient documentation

## 2013-02-24 DIAGNOSIS — Z8669 Personal history of other diseases of the nervous system and sense organs: Secondary | ICD-10-CM | POA: Insufficient documentation

## 2013-02-24 DIAGNOSIS — IMO0002 Reserved for concepts with insufficient information to code with codable children: Secondary | ICD-10-CM | POA: Insufficient documentation

## 2013-02-24 DIAGNOSIS — I2510 Atherosclerotic heart disease of native coronary artery without angina pectoris: Secondary | ICD-10-CM | POA: Insufficient documentation

## 2013-02-24 DIAGNOSIS — Y929 Unspecified place or not applicable: Secondary | ICD-10-CM | POA: Insufficient documentation

## 2013-02-24 DIAGNOSIS — F17200 Nicotine dependence, unspecified, uncomplicated: Secondary | ICD-10-CM | POA: Insufficient documentation

## 2013-02-24 DIAGNOSIS — Z8674 Personal history of sudden cardiac arrest: Secondary | ICD-10-CM | POA: Insufficient documentation

## 2013-02-24 DIAGNOSIS — Z8679 Personal history of other diseases of the circulatory system: Secondary | ICD-10-CM | POA: Insufficient documentation

## 2013-02-24 DIAGNOSIS — Z9849 Cataract extraction status, unspecified eye: Secondary | ICD-10-CM | POA: Insufficient documentation

## 2013-02-24 DIAGNOSIS — Z8719 Personal history of other diseases of the digestive system: Secondary | ICD-10-CM | POA: Insufficient documentation

## 2013-02-24 DIAGNOSIS — Z880 Allergy status to penicillin: Secondary | ICD-10-CM | POA: Insufficient documentation

## 2013-02-24 MED ORDER — OXYCODONE-ACETAMINOPHEN 5-325 MG PO TABS: 2.0000 | ORAL_TABLET | ORAL | Status: DC | PRN

## 2013-02-24 MED ORDER — OXYCODONE-ACETAMINOPHEN 5-325 MG PO TABS
2.0000 | ORAL_TABLET | Freq: Once | ORAL | Status: AC
  Administered 2013-02-24: 2 via ORAL
  Filled 2013-02-24: qty 2

## 2013-02-24 NOTE — ED Provider Notes (Signed)
Medical screening examination/treatment/procedure(s) were performed by non-physician practitioner and as supervising physician I was immediately available for consultation/collaboration.  Kalman Drape, MD 02/24/13 [phone]

## 2013-02-24 NOTE — ED Notes (Signed)
Pt returned from xray

## 2013-02-24 NOTE — ED Notes (Signed)
Patient transported to X-ray 
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
## 2013-02-24 NOTE — ED Notes (Signed)
The patient was trying to transition from her wheelchair to her bed when she slipped and fell and hit her left knee on a tile floor.

## 2013-02-24 NOTE — Telephone Encounter (Signed)
Request from pharmacy for refill on oxycodone.  He was rx 01/25/13 at last visit but no UDS or CSA signed, and received percocet from another provider 02/24/13.  Next appt must get UDS and CSA signed or discontinue prescribing controlled substances,

## 2013-02-25 ENCOUNTER — Encounter: Payer: Medicaid Other | Admitting: Physical Medicine & Rehabilitation

## 2013-03-01 ENCOUNTER — Other Ambulatory Visit: Payer: Self-pay | Admitting: *Deleted

## 2013-03-01 DIAGNOSIS — I739 Peripheral vascular disease, unspecified: Secondary | ICD-10-CM

## 2013-03-01 DIAGNOSIS — Z89512 Acquired absence of left leg below knee: Secondary | ICD-10-CM

## 2013-03-01 NOTE — Telephone Encounter (Signed)
Unclear about refills.  Looks like she got some in ED on 5/29 but pt denies. Also see note from Dr Naaman Plummer on 5/28

## 2013-03-03 NOTE — Telephone Encounter (Signed)
Pt informed

## 2013-03-08 ENCOUNTER — Other Ambulatory Visit (HOSPITAL_COMMUNITY): Payer: Self-pay | Admitting: Nephrology

## 2013-03-08 ENCOUNTER — Ambulatory Visit: Payer: Medicaid Other | Admitting: Physical Medicine & Rehabilitation

## 2013-03-08 DIAGNOSIS — N184 Chronic kidney disease, stage 4 (severe): Secondary | ICD-10-CM

## 2013-03-11 ENCOUNTER — Encounter: Payer: Medicaid Other | Attending: Physical Medicine & Rehabilitation | Admitting: Physical Medicine & Rehabilitation

## 2013-03-11 ENCOUNTER — Encounter: Payer: Self-pay | Admitting: Physical Medicine & Rehabilitation

## 2013-03-11 VITALS — BP 182/56 | HR 62 | Resp 14 | Ht 62.0 in | Wt 127.0 lb

## 2013-03-11 DIAGNOSIS — G546 Phantom limb syndrome with pain: Secondary | ICD-10-CM

## 2013-03-11 DIAGNOSIS — Z89512 Acquired absence of left leg below knee: Secondary | ICD-10-CM

## 2013-03-11 DIAGNOSIS — E1149 Type 2 diabetes mellitus with other diabetic neurological complication: Secondary | ICD-10-CM

## 2013-03-11 DIAGNOSIS — S88119A Complete traumatic amputation at level between knee and ankle, unspecified lower leg, initial encounter: Secondary | ICD-10-CM

## 2013-03-11 DIAGNOSIS — Z79899 Other long term (current) drug therapy: Secondary | ICD-10-CM

## 2013-03-11 DIAGNOSIS — N189 Chronic kidney disease, unspecified: Secondary | ICD-10-CM

## 2013-03-11 DIAGNOSIS — G547 Phantom limb syndrome without pain: Secondary | ICD-10-CM

## 2013-03-11 DIAGNOSIS — I739 Peripheral vascular disease, unspecified: Secondary | ICD-10-CM

## 2013-03-11 DIAGNOSIS — N183 Chronic kidney disease, stage 3 unspecified: Secondary | ICD-10-CM

## 2013-03-11 DIAGNOSIS — N179 Acute kidney failure, unspecified: Secondary | ICD-10-CM

## 2013-03-11 DIAGNOSIS — E1129 Type 2 diabetes mellitus with other diabetic kidney complication: Secondary | ICD-10-CM

## 2013-03-11 DIAGNOSIS — Z5181 Encounter for therapeutic drug level monitoring: Secondary | ICD-10-CM

## 2013-03-11 DIAGNOSIS — L98499 Non-pressure chronic ulcer of skin of other sites with unspecified severity: Secondary | ICD-10-CM

## 2013-03-11 MED ORDER — GABAPENTIN 400 MG PO CAPS: 400.0000 mg | ORAL_CAPSULE | Freq: Three times a day (TID) | ORAL | Status: DC

## 2013-03-11 MED ORDER — OXYCODONE-ACETAMINOPHEN 5-325 MG PO TABS: 1.0000 | ORAL_TABLET | Freq: Three times a day (TID) | ORAL | Status: DC | PRN
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
## 2013-03-11 NOTE — Patient Instructions (Signed)
REMEMBER TO APPLY YOUR SHRINKER SOCK CORRECTLY AS I SHOWED YOU TODAY.   CALL ME WITH ANY PROBLEMS OR QUESTIONS [id number]).  HAVE A GOOD DAY

## 2013-03-14 ENCOUNTER — Ambulatory Visit (HOSPITAL_COMMUNITY): Payer: Medicaid Other

## 2013-03-18 ENCOUNTER — Ambulatory Visit: Payer: Medicaid Other | Attending: Physical Medicine & Rehabilitation | Admitting: Physical Therapy

## 2013-03-18 DIAGNOSIS — Z5189 Encounter for other specified aftercare: Secondary | ICD-10-CM | POA: Insufficient documentation

## 2013-03-18 DIAGNOSIS — M2560 Stiffness of unspecified joint, not elsewhere classified: Secondary | ICD-10-CM | POA: Insufficient documentation

## 2013-03-18 DIAGNOSIS — S78119A Complete traumatic amputation at level between unspecified hip and knee, initial encounter: Secondary | ICD-10-CM | POA: Insufficient documentation

## 2013-03-18 DIAGNOSIS — R5381 Other malaise: Secondary | ICD-10-CM | POA: Insufficient documentation

## 2013-03-22 ENCOUNTER — Ambulatory Visit (HOSPITAL_COMMUNITY): Payer: Medicaid Other

## 2013-03-23 ENCOUNTER — Telehealth: Payer: Self-pay

## 2013-03-23 NOTE — Telephone Encounter (Signed)
Patient informed of inconsistent urine drug screen.  Address confirmed and discharge letter and list of other pain management mailed certified to her.

## 2013-03-23 NOTE — Telephone Encounter (Signed)
Message copied by Amado Coe on Wed Mar 23, 2013  3:59 PM ------      Message from: Aundria Mems      Created: Tue Mar 22, 2013  2:48 PM       D/C patient ------

## 2013-03-30 ENCOUNTER — Ambulatory Visit (HOSPITAL_COMMUNITY)
Admission: RE | Admit: 2013-03-30 | Discharge: 2013-03-30 | Disposition: A | Discharge status: Home / Self Care | Payer: Medicaid Other | Source: Ambulatory Visit | Attending: Nephrology | Admitting: Nephrology

## 2013-03-30 DIAGNOSIS — N281 Cyst of kidney, acquired: Secondary | ICD-10-CM | POA: Insufficient documentation

## 2013-03-30 DIAGNOSIS — K8020 Calculus of gallbladder without cholecystitis without obstruction: Secondary | ICD-10-CM | POA: Insufficient documentation

## 2013-03-30 DIAGNOSIS — N189 Chronic kidney disease, unspecified: Secondary | ICD-10-CM | POA: Insufficient documentation

## 2013-03-30 DIAGNOSIS — N184 Chronic kidney disease, stage 4 (severe): Secondary | ICD-10-CM

## 2013-04-04 ENCOUNTER — Encounter: Payer: Self-pay | Admitting: *Deleted

## 2013-04-05 ENCOUNTER — Encounter: Payer: Medicaid Other | Admitting: Internal Medicine

## 2013-04-07 ENCOUNTER — Other Ambulatory Visit: Payer: Self-pay

## 2013-04-11 ENCOUNTER — Ambulatory Visit: Payer: Medicaid Other | Attending: Physical Medicine & Rehabilitation | Admitting: Physical Therapy

## 2013-04-11 DIAGNOSIS — M2560 Stiffness of unspecified joint, not elsewhere classified: Secondary | ICD-10-CM | POA: Insufficient documentation

## 2013-04-11 DIAGNOSIS — R5381 Other malaise: Secondary | ICD-10-CM | POA: Insufficient documentation

## 2013-04-11 DIAGNOSIS — Z5189 Encounter for other specified aftercare: Secondary | ICD-10-CM | POA: Insufficient documentation

## 2013-04-11 DIAGNOSIS — S78119A Complete traumatic amputation at level between unspecified hip and knee, initial encounter: Secondary | ICD-10-CM | POA: Insufficient documentation

## 2013-04-18 ENCOUNTER — Other Ambulatory Visit: Payer: Self-pay | Admitting: Internal Medicine

## 2013-04-18 ENCOUNTER — Other Ambulatory Visit: Payer: Self-pay | Admitting: *Deleted

## 2013-04-18 MED ORDER — ROSUVASTATIN CALCIUM 10 MG PO TABS: 10.0000 mg | ORAL_TABLET | Freq: Every day | ORAL | Status: DC

## 2013-04-18 MED ORDER — ROSUVASTATIN CALCIUM 10 MG PO TABS: 10.0000 mg | ORAL_TABLET | Freq: Every day | ORAL | Status: AC

## 2013-04-19 ENCOUNTER — Ambulatory Visit: Payer: Medicaid Other | Admitting: Physical Therapy

## 2013-04-19 NOTE — Telephone Encounter (Signed)
Rx called in to pharmacy. 

## 2013-04-20 ENCOUNTER — Encounter: Payer: Medicaid Other | Admitting: Physical Therapy

## 2013-04-22 ENCOUNTER — Ambulatory Visit: Payer: Medicaid Other | Admitting: Physical Therapy

## 2013-04-25 ENCOUNTER — Ambulatory Visit: Payer: Medicaid Other | Admitting: Physical Therapy

## 2013-04-27 ENCOUNTER — Ambulatory Visit: Payer: Medicaid Other | Admitting: Physical Therapy

## 2013-04-28 ENCOUNTER — Ambulatory Visit: Payer: Medicaid Other | Admitting: Physical Therapy

## 2013-05-02 ENCOUNTER — Ambulatory Visit: Payer: Medicaid Other | Attending: Physical Medicine & Rehabilitation | Admitting: Physical Therapy

## 2013-05-02 DIAGNOSIS — R5381 Other malaise: Secondary | ICD-10-CM | POA: Insufficient documentation

## 2013-05-02 DIAGNOSIS — Z5189 Encounter for other specified aftercare: Secondary | ICD-10-CM | POA: Insufficient documentation

## 2013-05-02 DIAGNOSIS — M2560 Stiffness of unspecified joint, not elsewhere classified: Secondary | ICD-10-CM | POA: Insufficient documentation

## 2013-05-02 DIAGNOSIS — S78119A Complete traumatic amputation at level between unspecified hip and knee, initial encounter: Secondary | ICD-10-CM | POA: Insufficient documentation

## 2013-05-09 ENCOUNTER — Ambulatory Visit: Payer: Medicaid Other | Admitting: Physical Therapy

## 2013-05-10 ENCOUNTER — Encounter: Payer: Medicaid Other | Admitting: Internal Medicine

## 2013-05-11 ENCOUNTER — Ambulatory Visit: Payer: Medicaid Other | Admitting: Physical Medicine & Rehabilitation

## 2013-05-16 ENCOUNTER — Ambulatory Visit: Payer: Medicaid Other | Admitting: Physical Therapy

## 2013-05-19 ENCOUNTER — Encounter: Payer: Self-pay | Admitting: Internal Medicine

## 2013-05-23 ENCOUNTER — Encounter: Payer: Medicaid Other | Admitting: Physical Therapy

## 2013-05-24 ENCOUNTER — Encounter: Payer: Medicaid Other | Admitting: Physical Therapy

## 2013-05-31 ENCOUNTER — Ambulatory Visit: Payer: Medicaid Other | Attending: Physical Medicine & Rehabilitation | Admitting: Physical Therapy

## 2013-05-31 DIAGNOSIS — S78119A Complete traumatic amputation at level between unspecified hip and knee, initial encounter: Secondary | ICD-10-CM | POA: Insufficient documentation

## 2013-05-31 DIAGNOSIS — R5381 Other malaise: Secondary | ICD-10-CM | POA: Insufficient documentation

## 2013-05-31 DIAGNOSIS — Z5189 Encounter for other specified aftercare: Secondary | ICD-10-CM | POA: Insufficient documentation

## 2013-05-31 DIAGNOSIS — M2560 Stiffness of unspecified joint, not elsewhere classified: Secondary | ICD-10-CM | POA: Insufficient documentation

## 2013-06-06 ENCOUNTER — Ambulatory Visit: Payer: Medicaid Other | Admitting: Physical Therapy

## 2013-06-06 ENCOUNTER — Other Ambulatory Visit: Payer: Self-pay | Admitting: Internal Medicine

## 2013-06-08 ENCOUNTER — Other Ambulatory Visit: Payer: Self-pay | Admitting: *Deleted

## 2013-06-08 DIAGNOSIS — I739 Peripheral vascular disease, unspecified: Secondary | ICD-10-CM

## 2013-06-08 DIAGNOSIS — E1149 Type 2 diabetes mellitus with other diabetic neurological complication: Secondary | ICD-10-CM

## 2013-06-08 DIAGNOSIS — N183 Chronic kidney disease, stage 3 unspecified: Secondary | ICD-10-CM

## 2013-06-08 DIAGNOSIS — Z5181 Encounter for therapeutic drug level monitoring: Secondary | ICD-10-CM

## 2013-06-08 DIAGNOSIS — Z89512 Acquired absence of left leg below knee: Secondary | ICD-10-CM

## 2013-06-08 DIAGNOSIS — Z79899 Other long term (current) drug therapy: Secondary | ICD-10-CM

## 2013-06-08 DIAGNOSIS — N179 Acute kidney failure, unspecified: Secondary | ICD-10-CM

## 2013-06-08 DIAGNOSIS — E1129 Type 2 diabetes mellitus with other diabetic kidney complication: Secondary | ICD-10-CM

## 2013-06-08 DIAGNOSIS — G546 Phantom limb syndrome with pain: Secondary | ICD-10-CM

## 2013-06-08 DIAGNOSIS — N189 Chronic kidney disease, unspecified: Secondary | ICD-10-CM

## 2013-06-08 DIAGNOSIS — S88119A Complete traumatic amputation at level between knee and ankle, unspecified lower leg, initial encounter: Secondary | ICD-10-CM

## 2013-06-08 MED ORDER — ISOSORBIDE MONONITRATE ER 60 MG PO TB24: 60.0000 mg | ORAL_TABLET | Freq: Every day | ORAL | Status: DC

## 2013-06-08 MED ORDER — CARVEDILOL 12.5 MG PO TABS: 12.5000 mg | ORAL_TABLET | Freq: Two times a day (BID) | ORAL | Status: DC

## 2013-06-09 NOTE — Telephone Encounter (Signed)
Both Rx called in to pharmacy. Pt aware pain med was denied per Dr Burnard Bunting - suggest to call pain clinic.

## 2013-06-13 ENCOUNTER — Encounter: Payer: Medicaid Other | Admitting: Physical Therapy

## 2013-06-13 ENCOUNTER — Ambulatory Visit: Payer: Medicaid Other | Admitting: Physical Therapy

## 2013-06-14 ENCOUNTER — Encounter: Payer: Medicaid Other | Admitting: Physical Therapy

## 2013-07-06 ENCOUNTER — Encounter: Payer: Self-pay | Admitting: Podiatry

## 2013-07-06 ENCOUNTER — Ambulatory Visit (INDEPENDENT_AMBULATORY_CARE_PROVIDER_SITE_OTHER): Payer: Medicaid Other | Admitting: Podiatry

## 2013-07-06 VITALS — BP 144/70 | HR 62 | Temp 97.3°F | Resp 16 | Ht 62.0 in | Wt 120.0 lb

## 2013-07-06 DIAGNOSIS — L97509 Non-pressure chronic ulcer of other part of unspecified foot with unspecified severity: Secondary | ICD-10-CM

## 2013-07-06 DIAGNOSIS — L97511 Non-pressure chronic ulcer of other part of right foot limited to breakdown of skin: Secondary | ICD-10-CM

## 2013-07-06 NOTE — Progress Notes (Signed)
  Subjective:    Patient ID: [name] [name], female    DOB: Apr 07, 1951, 62 y.o.   MRN: [id number]  HPI This patient presents today for maintenance of ongoing ulcerative and pre-ulcerative skin lesion on the right foot. Patient has undergone BK amputation left lower extremity and is able to tolerate a prosthetic limb on that extremity and limited time intervals.     Review of Systems  Constitutional: Positive for appetite change and unexpected weight change.  Eyes: Negative.   Respiratory: Negative.   Cardiovascular: Negative.   Gastrointestinal: Positive for diarrhea.  Endocrine: Negative.   Musculoskeletal: Positive for back pain and gait problem.  Skin: Positive for color change.  Allergic/Immunologic: Negative.   Neurological: Positive for light-headedness and headaches.  Hematological: Bruises/bleeds easily.  Psychiatric/Behavioral: The patient is nervous/anxious.        Objective:   Physical Exam The plantar fifth right metatarsal phalangeal joint area demonstrates hyperkeratotic tissue with hemorrhagic debris noted in the central aspect of the lesion.        Assessment & Plan:  Ulcer of foot, right, limited to breakdown of skin  The hyperkeratotic tissue and hemorrhagic tissue was debrided today back with Silvadene and a gauze dressing applied. The patient will maintain a gauze dressing with Silvadene for the next several days. She will ambulate in either her diabetic shoe with a Plastizote insole or the surgical shoe.  Reappoint in the next 2 weeks.  Richard C.Amalia Hailey, DPM

## 2013-07-06 NOTE — Patient Instructions (Signed)
Apply  Silvadene cream to the skin ulcer until healed on a daily basis. Always walk with surgical shoe or diabetic shoe on.

## 2013-07-11 ENCOUNTER — Other Ambulatory Visit: Payer: Self-pay | Admitting: Internal Medicine

## 2013-07-14 NOTE — Telephone Encounter (Signed)
Called to pharm 

## 2013-08-02 ENCOUNTER — Encounter: Payer: Self-pay | Admitting: Family

## 2013-08-03 ENCOUNTER — Ambulatory Visit (INDEPENDENT_AMBULATORY_CARE_PROVIDER_SITE_OTHER): Payer: Medicaid Other | Admitting: Family

## 2013-08-03 ENCOUNTER — Encounter (HOSPITAL_COMMUNITY): Payer: Medicaid Other

## 2013-08-03 ENCOUNTER — Encounter: Payer: Self-pay | Admitting: Family

## 2013-08-03 ENCOUNTER — Ambulatory Visit: Payer: Medicaid Other | Admitting: Family

## 2013-08-03 VITALS — BP 183/84 | HR 60 | Resp 16 | Ht 62.0 in | Wt 120.0 lb

## 2013-08-03 DIAGNOSIS — I7025 Atherosclerosis of native arteries of other extremities with ulceration: Secondary | ICD-10-CM | POA: Insufficient documentation

## 2013-08-03 DIAGNOSIS — Z48812 Encounter for surgical aftercare following surgery on the circulatory system: Secondary | ICD-10-CM | POA: Insufficient documentation

## 2013-08-03 DIAGNOSIS — I739 Peripheral vascular disease, unspecified: Secondary | ICD-10-CM | POA: Insufficient documentation

## 2013-08-03 NOTE — Patient Instructions (Addendum)
Peripheral Vascular Disease Peripheral Vascular Disease (PVD), also called Peripheral Arterial Disease (PAD), is a circulation problem caused by cholesterol (atherosclerotic plaque) deposits in the arteries. PVD commonly occurs in the lower extremities (legs) but it can occur in other areas of the body, such as your arms. The cholesterol buildup in the arteries reduces blood flow which can cause pain and other serious problems. The presence of PVD can place a person at risk for Coronary Artery Disease (CAD).  CAUSES  Causes of PVD can be many. It is usually associated with more than one risk factor such as:   High Cholesterol.  Smoking.  Diabetes.  Lack of exercise or inactivity.  High blood pressure (hypertension).  Obesity.  Family history. SYMPTOMS   When the lower extremities are affected, patients with PVD may experience:  Leg pain with exertion or physical activity. This is called INTERMITTENT CLAUDICATION. This may present as cramping or numbness with physical activity. The location of the pain is associated with the level of blockage. For example, blockage at the abdominal level (distal abdominal aorta) may result in buttock or hip pain. Lower leg arterial blockage may result in calf pain.  As PVD becomes more severe, pain can develop with less physical activity.  In people with severe PVD, leg pain may occur at rest.  Other PVD signs and symptoms:  Leg numbness or weakness.  Coldness in the affected leg or foot, especially when compared to the other leg.  A change in leg color.  Patients with significant PVD are more prone to ulcers or sores on toes, feet or legs. These may take longer to heal or may reoccur. The ulcers or sores can become infected.  If signs and symptoms of PVD are ignored, gangrene may occur. This can result in the loss of toes or loss of an entire limb.  Not all leg pain is related to PVD. Other medical conditions can cause leg pain such  as:  Blood clots (embolism) or Deep Vein Thrombosis.  Inflammation of the blood vessels (vasculitis).  Spinal stenosis. DIAGNOSIS  Diagnosis of PVD can involve several different types of tests. These can include:  Pulse Volume Recording Method (PVR). This test is simple, painless and does not involve the use of X-rays. PVR involves measuring and comparing the blood pressure in the arms and legs. An ABI (Ankle-Brachial Index) is calculated. The normal ratio of blood pressures is 1. As this number becomes smaller, it indicates more severe disease.  < 0.95  indicates significant narrowing in one or more leg vessels.  <0.8 there will usually be pain in the foot, leg or buttock with exercise.  <0.4 will usually have pain in the legs at rest.  <0.25  usually indicates limb threatening PVD.  Doppler detection of pulses in the legs. This test is painless and checks to see if you have a pulses in your legs/feet.  A dye or contrast material (a substance that highlights the blood vessels so they show up on x-ray) may be given to help your caregiver better see the arteries for the following tests. The dye is eliminated from your body by the kidney's. Your caregiver may order blood work to check your kidney function and other laboratory values before the following tests are performed:  Magnetic Resonance Angiography (MRA). An MRA is a picture study of the blood vessels and arteries. The MRA machine uses a large magnet to produce images of the blood vessels.  Computed Tomography Angiography (CTA). A CTA is a   specialized x-ray that looks at how the blood flows in your blood vessels. An IV may be inserted into your arm so contrast dye can be injected.  Angiogram. Is a procedure that uses x-rays to look at your blood vessels. This procedure is minimally invasive, meaning a small incision (cut) is made in your groin. A small tube (catheter) is then inserted into the artery of your groin. The catheter is  guided to the blood vessel or artery your caregiver wants to examine. Contrast dye is injected into the catheter. X-rays are then taken of the blood vessel or artery. After the images are obtained, the catheter is taken out. TREATMENT  Treatment of PVD involves many interventions which may include:  Lifestyle changes:  Quitting smoking.  Exercise.  Following a low fat, low cholesterol diet.  Control of diabetes.  Foot care is very important to the PVD patient. Good foot care can help prevent infection.  Medication:  Cholesterol-lowering medicine.  Blood pressure medicine.  Anti-platelet drugs.  Certain medicines may reduce symptoms of Intermittent Claudication.  Interventional/Surgical options:  Angioplasty. An Angioplasty is a procedure that inflates a balloon in the blocked artery. This opens the blocked artery to improve blood flow.  Stent Implant. A wire mesh tube (stent) is placed in the artery. The stent expands and stays in place, allowing the artery to remain open.  Peripheral Bypass Surgery. This is a surgical procedure that reroutes the blood around a blocked artery to help improve blood flow. This type of procedure may be performed if Angioplasty or stent implants are not an option. SEEK IMMEDIATE MEDICAL CARE IF:   You develop pain or numbness in your arms or legs.  Your arm or leg turns cold, becomes blue in color.  You develop redness, warmth, swelling and pain in your arms or legs. MAKE SURE YOU:   Understand these instructions.  Will watch your condition.  Will get help right away if you are not doing well or get worse. Document Released: 10/23/2004 Document Revised: 12/08/2011 Document Reviewed: 09/19/2008 Exit[employer] Patient Information 2014 [employer].   Smoking Cessation Quitting smoking is important to your health and has many advantages. However, it is not always easy to quit since nicotine is a very addictive drug. Often times, people try 3  times or more before being able to quit. This document explains the best ways for you to prepare to quit smoking. Quitting takes hard work and a lot of effort, but you can do it. ADVANTAGES OF QUITTING SMOKING  You will live longer, feel better, and live better.  Your body will feel the impact of quitting smoking almost immediately.  Within 20 minutes, blood pressure decreases. Your pulse returns to its normal level.  After 8 hours, carbon monoxide levels in the blood return to normal. Your oxygen level increases.  After 24 hours, the chance of having a heart attack starts to decrease. Your breath, hair, and body stop smelling like smoke.  After 48 hours, damaged nerve endings begin to recover. Your sense of taste and smell improve.  After 72 hours, the body is virtually free of nicotine. Your bronchial tubes relax and breathing becomes easier.  After 2 to 12 weeks, lungs can hold more air. Exercise becomes easier and circulation improves.  The risk of having a heart attack, stroke, cancer, or lung disease is greatly reduced.  After 1 year, the risk of coronary heart disease is cut in half.  After 5 years, the risk of stroke falls to   the same as a nonsmoker.  After 10 years, the risk of lung cancer is cut in half and the risk of other cancers decreases significantly.  After 15 years, the risk of coronary heart disease drops, usually to the level of a nonsmoker.  If you are pregnant, quitting smoking will improve your chances of having a healthy baby.  The people you live with, especially any children, will be healthier.  You will have extra money to spend on things other than cigarettes. QUESTIONS TO THINK ABOUT BEFORE ATTEMPTING TO QUIT You may want to talk about your answers with your caregiver.  Why do you want to quit?  If you tried to quit in the past, what helped and what did not?  What will be the most difficult situations for you after you quit? How will you plan to  handle them?  Who can help you through the tough times? Your family? Friends? A caregiver?  What pleasures do you get from smoking? What ways can you still get pleasure if you quit? Here are some questions to ask your caregiver:  How can you help me to be successful at quitting?  What medicine do you think would be best for me and how should I take it?  What should I do if I need more help?  What is smoking withdrawal like? How can I get information on withdrawal? GET READY  Set a quit date.  Change your environment by getting rid of all cigarettes, ashtrays, matches, and lighters in your home, car, or work. Do not let people smoke in your home.  Review your past attempts to quit. Think about what worked and what did not. GET SUPPORT AND ENCOURAGEMENT You have a better chance of being successful if you have help. You can get support in many ways.  Tell your family, friends, and co-workers that you are going to quit and need their support. Ask them not to smoke around you.  Get individual, group, or telephone counseling and support. Programs are available at local hospitals and health centers. Call your local health department for information about programs in your area.  Spiritual beliefs and practices may help some smokers quit.  Download a "quit meter" on your computer to keep track of quit statistics, such as how long you have gone without smoking, cigarettes not smoked, and money saved.  Get a self-help book about quitting smoking and staying off of tobacco. LEARN NEW SKILLS AND BEHAVIORS  Distract yourself from urges to smoke. Talk to someone, go for a walk, or occupy your time with a task.  Change your normal routine. Take a different route to work. Drink tea instead of coffee. Eat breakfast in a different place.  Reduce your stress. Take a hot bath, exercise, or read a book.  Plan something enjoyable to do every day. Reward yourself for not smoking.  Explore  interactive web-based programs that specialize in helping you quit. GET MEDICINE AND USE IT CORRECTLY Medicines can help you stop smoking and decrease the urge to smoke. Combining medicine with the above behavioral methods and support can greatly increase your chances of successfully quitting smoking.  Nicotine replacement therapy helps deliver nicotine to your body without the negative effects and risks of smoking. Nicotine replacement therapy includes nicotine gum, lozenges, inhalers, nasal sprays, and skin patches. Some may be available over-the-counter and others require a prescription.  Antidepressant medicine helps people abstain from smoking, but how this works is unknown. This medicine is available by prescription.    Nicotinic receptor partial agonist medicine simulates the effect of nicotine in your brain. This medicine is available by prescription. Ask your caregiver for advice about which medicines to use and how to use them based on your health history. Your caregiver will tell you what side effects to look out for if you choose to be on a medicine or therapy. Carefully read the information on the package. Do not use any other product containing nicotine while using a nicotine replacement product.  RELAPSE OR DIFFICULT SITUATIONS Most relapses occur within the first 3 months after quitting. Do not be discouraged if you start smoking again. Remember, most people try several times before finally quitting. You may have symptoms of withdrawal because your body is used to nicotine. You may crave cigarettes, be irritable, feel very hungry, cough often, get headaches, or have difficulty concentrating. The withdrawal symptoms are only temporary. They are strongest when you first quit, but they will go away within 10 14 days. To reduce the chances of relapse, try to:  Avoid drinking alcohol. Drinking lowers your chances of successfully quitting.  Reduce the amount of caffeine you consume. Once you  quit smoking, the amount of caffeine in your body increases and can give you symptoms, such as a rapid heartbeat, sweating, and anxiety.  Avoid smokers because they can make you want to smoke.  Do not let weight gain distract you. Many smokers will gain weight when they quit, usually less than 10 pounds. Eat a healthy diet and stay active. You can always lose the weight gained after you quit.  Find ways to improve your mood other than smoking. FOR MORE INFORMATION  www.smokefree.gov  Document Released: 09/09/2001 Document Revised: 03/16/2012 Document Reviewed: 12/25/2011 [employer] Patient Information 2014 [employer].  
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
## 2013-08-03 NOTE — Addendum Note (Signed)
Addended by: Mena Goes on: 08/03/2013 12:47 PM   Modules accepted: Orders

## 2013-08-31 ENCOUNTER — Encounter: Payer: Self-pay | Admitting: Podiatry

## 2013-08-31 ENCOUNTER — Encounter: Payer: Self-pay | Admitting: Family

## 2013-08-31 ENCOUNTER — Ambulatory Visit (INDEPENDENT_AMBULATORY_CARE_PROVIDER_SITE_OTHER): Payer: Medicaid Other | Admitting: Podiatry

## 2013-08-31 ENCOUNTER — Ambulatory Visit: Payer: Medicaid Other | Admitting: Podiatry

## 2013-08-31 VITALS — BP 168/90 | HR 61 | Temp 98.0°F | Resp 16 | Ht 62.0 in | Wt 127.0 lb

## 2013-08-31 DIAGNOSIS — L97509 Non-pressure chronic ulcer of other part of unspecified foot with unspecified severity: Secondary | ICD-10-CM

## 2013-08-31 DIAGNOSIS — L97511 Non-pressure chronic ulcer of other part of right foot limited to breakdown of skin: Secondary | ICD-10-CM

## 2013-08-31 NOTE — Patient Instructions (Signed)
Wear her diabetic shoe with insole that has an additional pad attached to the bottom to offload the base of the fifth right toe. Return in 2 weeks.

## 2013-08-31 NOTE — Progress Notes (Signed)
Patient ID: [name] [name], female   DOB: Jul 23, 1951, 62 y.o.   MRN: [id number]   Subjective: Patient presents for ongoing care for ulcerative and pre-ulcerative area plantar fifth right MPJ. Patient has left lower leg amputation. She will has a prosthetic limb that she wears on a regular basis.  Objective: Hypertrophic toenails with deformity x5 noted right. Large buildup of hyperkeratotic tissue with nucleus plantar sub-fifth right MPJ. After debridement the lesion remains closed.  Assessment: Pre-ulcerative hyperkeratotic lesion plantar fifth right MPJ.  Plan: The hyperkeratotic tissue is debrided back without any bleeding. I attached an additional felt pad with the cut out to offload the plantar fifth right MPJ to the existing Plastizote insole. Advised patient to wear this shoe with this modified insole. Reappoint at two-week intervals for further pre-ulcerative debridement. Nails may need debridement at next visit as well.

## 2013-09-01 ENCOUNTER — Telehealth: Payer: Self-pay

## 2013-09-01 ENCOUNTER — Ambulatory Visit (INDEPENDENT_AMBULATORY_CARE_PROVIDER_SITE_OTHER): Payer: Medicaid Other | Admitting: Family

## 2013-09-01 ENCOUNTER — Ambulatory Visit (HOSPITAL_COMMUNITY)
Admission: RE | Admit: 2013-09-01 | Discharge: 2013-09-01 | Disposition: A | Discharge status: Home / Self Care | Payer: Medicaid Other | Source: Ambulatory Visit | Attending: Family | Admitting: Family

## 2013-09-01 ENCOUNTER — Encounter: Payer: Self-pay | Admitting: Family

## 2013-09-01 VITALS — BP 155/76 | HR 62 | Resp 16 | Ht 62.0 in | Wt 127.0 lb

## 2013-09-01 DIAGNOSIS — I739 Peripheral vascular disease, unspecified: Secondary | ICD-10-CM

## 2013-09-01 DIAGNOSIS — L98499 Non-pressure chronic ulcer of skin of other sites with unspecified severity: Secondary | ICD-10-CM | POA: Insufficient documentation

## 2013-09-01 DIAGNOSIS — Z48812 Encounter for surgical aftercare following surgery on the circulatory system: Secondary | ICD-10-CM

## 2013-09-01 DIAGNOSIS — M79609 Pain in unspecified limb: Secondary | ICD-10-CM

## 2013-09-01 NOTE — Telephone Encounter (Signed)
Patient called requesting hydrocodone refill.  Informed patient she has been discharged form practice and needs to contact PCP.  Patient says she does not have a PCP, so I gave her names of facilities she could contact.

## 2013-09-01 NOTE — Patient Instructions (Addendum)
Peripheral Vascular Disease Peripheral Vascular Disease (PVD), also called Peripheral Arterial Disease (PAD), is a circulation problem caused by cholesterol (atherosclerotic plaque) deposits in the arteries. PVD commonly occurs in the lower extremities (legs) but it can occur in other areas of the body, such as your arms. The cholesterol buildup in the arteries reduces blood flow which can cause pain and other serious problems. The presence of PVD can place a person at risk for Coronary Artery Disease (CAD).  CAUSES  Causes of PVD can be many. It is usually associated with more than one risk factor such as:   High Cholesterol.  Smoking.  Diabetes.  Lack of exercise or inactivity.  High blood pressure (hypertension).  Obesity.  Family history. SYMPTOMS   When the lower extremities are affected, patients with PVD may experience:  Leg pain with exertion or physical activity. This is called INTERMITTENT CLAUDICATION. This may present as cramping or numbness with physical activity. The location of the pain is associated with the level of blockage. For example, blockage at the abdominal level (distal abdominal aorta) may result in buttock or hip pain. Lower leg arterial blockage may result in calf pain.  As PVD becomes more severe, pain can develop with less physical activity.  In people with severe PVD, leg pain may occur at rest.  Other PVD signs and symptoms:  Leg numbness or weakness.  Coldness in the affected leg or foot, especially when compared to the other leg.  A change in leg color.  Patients with significant PVD are more prone to ulcers or sores on toes, feet or legs. These may take longer to heal or may reoccur. The ulcers or sores can become infected.  If signs and symptoms of PVD are ignored, gangrene may occur. This can result in the loss of toes or loss of an entire limb.  Not all leg pain is related to PVD. Other medical conditions can cause leg pain such  as:  Blood clots (embolism) or Deep Vein Thrombosis.  Inflammation of the blood vessels (vasculitis).  Spinal stenosis. DIAGNOSIS  Diagnosis of PVD can involve several different types of tests. These can include:  Pulse Volume Recording Method (PVR). This test is simple, painless and does not involve the use of X-rays. PVR involves measuring and comparing the blood pressure in the arms and legs. An ABI (Ankle-Brachial Index) is calculated. The normal ratio of blood pressures is 1. As this number becomes smaller, it indicates more severe disease.  < 0.95  indicates significant narrowing in one or more leg vessels.  <0.8 there will usually be pain in the foot, leg or buttock with exercise.  <0.4 will usually have pain in the legs at rest.  <0.25  usually indicates limb threatening PVD.  Doppler detection of pulses in the legs. This test is painless and checks to see if you have a pulses in your legs/feet.  A dye or contrast material (a substance that highlights the blood vessels so they show up on x-ray) may be given to help your caregiver better see the arteries for the following tests. The dye is eliminated from your body by the kidney's. Your caregiver may order blood work to check your kidney function and other laboratory values before the following tests are performed:  Magnetic Resonance Angiography (MRA). An MRA is a picture study of the blood vessels and arteries. The MRA machine uses a large magnet to produce images of the blood vessels.  Computed Tomography Angiography (CTA). A CTA is a   specialized x-ray that looks at how the blood flows in your blood vessels. An IV may be inserted into your arm so contrast dye can be injected.  Angiogram. Is a procedure that uses x-rays to look at your blood vessels. This procedure is minimally invasive, meaning a small incision (cut) is made in your groin. A small tube (catheter) is then inserted into the artery of your groin. The catheter is  guided to the blood vessel or artery your caregiver wants to examine. Contrast dye is injected into the catheter. X-rays are then taken of the blood vessel or artery. After the images are obtained, the catheter is taken out. TREATMENT  Treatment of PVD involves many interventions which may include:  Lifestyle changes:  Quitting smoking.  Exercise.  Following a low fat, low cholesterol diet.  Control of diabetes.  Foot care is very important to the PVD patient. Good foot care can help prevent infection.  Medication:  Cholesterol-lowering medicine.  Blood pressure medicine.  Anti-platelet drugs.  Certain medicines may reduce symptoms of Intermittent Claudication.  Interventional/Surgical options:  Angioplasty. An Angioplasty is a procedure that inflates a balloon in the blocked artery. This opens the blocked artery to improve blood flow.  Stent Implant. A wire mesh tube (stent) is placed in the artery. The stent expands and stays in place, allowing the artery to remain open.  Peripheral Bypass Surgery. This is a surgical procedure that reroutes the blood around a blocked artery to help improve blood flow. This type of procedure may be performed if Angioplasty or stent implants are not an option. SEEK IMMEDIATE MEDICAL CARE IF:   You develop pain or numbness in your arms or legs.  Your arm or leg turns cold, becomes blue in color.  You develop redness, warmth, swelling and pain in your arms or legs. MAKE SURE YOU:   Understand these instructions.  Will watch your condition.  Will get help right away if you are not doing well or get worse. Document Released: 10/23/2004 Document Revised: 12/08/2011 Document Reviewed: 09/19/2008 Exit[employer] Patient Information 2014 [employer].   Smoking Cessation Quitting smoking is important to your health and has many advantages. However, it is not always easy to quit since nicotine is a very addictive drug. Often times, people try 3  times or more before being able to quit. This document explains the best ways for you to prepare to quit smoking. Quitting takes hard work and a lot of effort, but you can do it. ADVANTAGES OF QUITTING SMOKING  You will live longer, feel better, and live better.  Your body will feel the impact of quitting smoking almost immediately.  Within 20 minutes, blood pressure decreases. Your pulse returns to its normal level.  After 8 hours, carbon monoxide levels in the blood return to normal. Your oxygen level increases.  After 24 hours, the chance of having a heart attack starts to decrease. Your breath, hair, and body stop smelling like smoke.  After 48 hours, damaged nerve endings begin to recover. Your sense of taste and smell improve.  After 72 hours, the body is virtually free of nicotine. Your bronchial tubes relax and breathing becomes easier.  After 2 to 12 weeks, lungs can hold more air. Exercise becomes easier and circulation improves.  The risk of having a heart attack, stroke, cancer, or lung disease is greatly reduced.  After 1 year, the risk of coronary heart disease is cut in half.  After 5 years, the risk of stroke falls to   the same as a nonsmoker.  After 10 years, the risk of lung cancer is cut in half and the risk of other cancers decreases significantly.  After 15 years, the risk of coronary heart disease drops, usually to the level of a nonsmoker.  If you are pregnant, quitting smoking will improve your chances of having a healthy baby.  The people you live with, especially any children, will be healthier.  You will have extra money to spend on things other than cigarettes. QUESTIONS TO THINK ABOUT BEFORE ATTEMPTING TO QUIT You may want to talk about your answers with your caregiver.  Why do you want to quit?  If you tried to quit in the past, what helped and what did not?  What will be the most difficult situations for you after you quit? How will you plan to  handle them?  Who can help you through the tough times? Your family? Friends? A caregiver?  What pleasures do you get from smoking? What ways can you still get pleasure if you quit? Here are some questions to ask your caregiver:  How can you help me to be successful at quitting?  What medicine do you think would be best for me and how should I take it?  What should I do if I need more help?  What is smoking withdrawal like? How can I get information on withdrawal? GET READY  Set a quit date.  Change your environment by getting rid of all cigarettes, ashtrays, matches, and lighters in your home, car, or work. Do not let people smoke in your home.  Review your past attempts to quit. Think about what worked and what did not. GET SUPPORT AND ENCOURAGEMENT You have a better chance of being successful if you have help. You can get support in many ways.  Tell your family, friends, and co-workers that you are going to quit and need their support. Ask them not to smoke around you.  Get individual, group, or telephone counseling and support. Programs are available at local hospitals and health centers. Call your local health department for information about programs in your area.  Spiritual beliefs and practices may help some smokers quit.  Download a "quit meter" on your computer to keep track of quit statistics, such as how long you have gone without smoking, cigarettes not smoked, and money saved.  Get a self-help book about quitting smoking and staying off of tobacco. LEARN NEW SKILLS AND BEHAVIORS  Distract yourself from urges to smoke. Talk to someone, go for a walk, or occupy your time with a task.  Change your normal routine. Take a different route to work. Drink tea instead of coffee. Eat breakfast in a different place.  Reduce your stress. Take a hot bath, exercise, or read a book.  Plan something enjoyable to do every day. Reward yourself for not smoking.  Explore  interactive web-based programs that specialize in helping you quit. GET MEDICINE AND USE IT CORRECTLY Medicines can help you stop smoking and decrease the urge to smoke. Combining medicine with the above behavioral methods and support can greatly increase your chances of successfully quitting smoking.  Nicotine replacement therapy helps deliver nicotine to your body without the negative effects and risks of smoking. Nicotine replacement therapy includes nicotine gum, lozenges, inhalers, nasal sprays, and skin patches. Some may be available over-the-counter and others require a prescription.  Antidepressant medicine helps people abstain from smoking, but how this works is unknown. This medicine is available by prescription.    Nicotinic receptor partial agonist medicine simulates the effect of nicotine in your brain. This medicine is available by prescription. Ask your caregiver for advice about which medicines to use and how to use them based on your health history. Your caregiver will tell you what side effects to look out for if you choose to be on a medicine or therapy. Carefully read the information on the package. Do not use any other product containing nicotine while using a nicotine replacement product.  RELAPSE OR DIFFICULT SITUATIONS Most relapses occur within the first 3 months after quitting. Do not be discouraged if you start smoking again. Remember, most people try several times before finally quitting. You may have symptoms of withdrawal because your body is used to nicotine. You may crave cigarettes, be irritable, feel very hungry, cough often, get headaches, or have difficulty concentrating. The withdrawal symptoms are only temporary. They are strongest when you first quit, but they will go away within 10 14 days. To reduce the chances of relapse, try to:  Avoid drinking alcohol. Drinking lowers your chances of successfully quitting.  Reduce the amount of caffeine you consume. Once you  quit smoking, the amount of caffeine in your body increases and can give you symptoms, such as a rapid heartbeat, sweating, and anxiety.  Avoid smokers because they can make you want to smoke.  Do not let weight gain distract you. Many smokers will gain weight when they quit, usually less than 10 pounds. Eat a healthy diet and stay active. You can always lose the weight gained after you quit.  Find ways to improve your mood other than smoking. FOR MORE INFORMATION  www.smokefree.gov  Document Released: 09/09/2001 Document Revised: 03/16/2012 Document Reviewed: 12/25/2011 [employer] Patient Information 2014 [employer].  
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
## 2013-09-14 ENCOUNTER — Ambulatory Visit (INDEPENDENT_AMBULATORY_CARE_PROVIDER_SITE_OTHER): Payer: Medicaid Other | Admitting: Podiatry

## 2013-09-14 ENCOUNTER — Encounter: Payer: Self-pay | Admitting: Podiatry

## 2013-09-14 VITALS — BP 170/90 | HR 60 | Temp 96.7°F | Resp 14 | Ht 62.0 in | Wt 127.0 lb

## 2013-09-14 DIAGNOSIS — B351 Tinea unguium: Secondary | ICD-10-CM

## 2013-09-14 DIAGNOSIS — M79609 Pain in unspecified limb: Secondary | ICD-10-CM

## 2013-09-14 NOTE — Progress Notes (Signed)
Patient ID: SINIA HEASLIP, female   DOB: 12/17/1950, 62 y.o.   MRN: [id number]  Subjective: Patient presents for ongoing care for ulcerative area plantar fifth right MPJ, as well as nail debridement. She has had a left lower leg amputation,  Objective: 62 year old black female appears orientated x3. Toenails x5 on the right are hypertrophic, elongated, discolored, Hyperkeratotic tissue plantar fifth right MPJ noted.  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 Reappoint in 3 weeks.

## 2013-10-05 ENCOUNTER — Ambulatory Visit (INDEPENDENT_AMBULATORY_CARE_PROVIDER_SITE_OTHER): Payer: Medicaid Other | Admitting: Podiatry

## 2013-10-05 ENCOUNTER — Encounter: Payer: Self-pay | Admitting: Podiatry

## 2013-10-05 VITALS — BP 156/81 | HR 78 | Temp 98.0°F | Resp 16

## 2013-10-05 DIAGNOSIS — L97509 Non-pressure chronic ulcer of other part of unspecified foot with unspecified severity: Secondary | ICD-10-CM

## 2013-10-05 NOTE — Progress Notes (Signed)
Patient ID: [name] [name], female   DOB: 05/02/1951, 63 y.o.   MRN: [id number]  Subjective: Patient presents for ongoing wound care for ulcerative and pre-ulcerative plantar lesion fifth MPJ right foot. Patient has had a left lower leg amputation.  Objective: 63 year old black diabetic female appears orientated x3  Dermatological: The plantar fifth right MPJ as well organized hyperkeratotic lesion that remains closed after debridement.  Assessment: Pre-ulcerative hyperkeratotic lesion plantar fifth right MPJ  Plan: Debridement of hyperkeratotic tissue plantar fifth right MPJ. Patient will walk in her diabetic shoe with a Plastizote insole that has an additional felt pad to offload the fifth right MPJ. As the fifth MPJ right appears to be stable I will increase the interval between visits to 4 weeks however patient advised to contact office if she has concerns before her next scheduled visit.

## 2013-10-19 ENCOUNTER — Encounter: Payer: Self-pay | Admitting: Internal Medicine

## 2013-10-19 ENCOUNTER — Ambulatory Visit (INDEPENDENT_AMBULATORY_CARE_PROVIDER_SITE_OTHER): Payer: Medicaid Other | Admitting: Internal Medicine

## 2013-10-19 ENCOUNTER — Encounter: Payer: Medicaid Other | Admitting: Dietician

## 2013-10-19 ENCOUNTER — Telehealth: Payer: Self-pay | Admitting: Dietician

## 2013-10-19 VITALS — BP 130/82 | HR 66 | Temp 98.2°F

## 2013-10-19 DIAGNOSIS — F329 Major depressive disorder, single episode, unspecified: Secondary | ICD-10-CM

## 2013-10-19 DIAGNOSIS — Z1239 Encounter for other screening for malignant neoplasm of breast: Secondary | ICD-10-CM

## 2013-10-19 DIAGNOSIS — G547 Phantom limb syndrome without pain: Secondary | ICD-10-CM

## 2013-10-19 DIAGNOSIS — N183 Chronic kidney disease, stage 3 unspecified: Secondary | ICD-10-CM

## 2013-10-19 DIAGNOSIS — Z0000 Encounter for general adult medical examination without abnormal findings: Secondary | ICD-10-CM

## 2013-10-19 DIAGNOSIS — E1129 Type 2 diabetes mellitus with other diabetic kidney complication: Secondary | ICD-10-CM

## 2013-10-19 DIAGNOSIS — F32A Depression, unspecified: Secondary | ICD-10-CM

## 2013-10-19 DIAGNOSIS — R197 Diarrhea, unspecified: Secondary | ICD-10-CM

## 2013-10-19 DIAGNOSIS — E785 Hyperlipidemia, unspecified: Secondary | ICD-10-CM

## 2013-10-19 DIAGNOSIS — G546 Phantom limb syndrome with pain: Secondary | ICD-10-CM

## 2013-10-19 DIAGNOSIS — E1122 Type 2 diabetes mellitus with diabetic chronic kidney disease: Secondary | ICD-10-CM

## 2013-10-19 DIAGNOSIS — F3289 Other specified depressive episodes: Secondary | ICD-10-CM

## 2013-10-19 DIAGNOSIS — N189 Chronic kidney disease, unspecified: Secondary | ICD-10-CM

## 2013-10-19 LAB — LIPID PANEL
CHOLESTEROL: 175 mg/dL (ref 0–200)
HDL: 70 mg/dL (ref >39)
LDL CALC: 89 mg/dL (ref 0–99)
TRIGLYCERIDES: 79 mg/dL (ref <150)
Total CHOL/HDL Ratio: 2.5 Ratio
VLDL: 16 mg/dL (ref 0–40)

## 2013-10-19 LAB — GLUCOSE, CAPILLARY: Glucose-Capillary: 81 mg/dL (ref 70–99)

## 2013-10-19 LAB — POCT GLYCOSYLATED HEMOGLOBIN (HGB A1C): Hemoglobin A1C: 4.9

## 2013-10-19 MED ORDER — PAROXETINE HCL 30 MG PO TABS: 30.0000 mg | ORAL_TABLET | Freq: Every day | ORAL | Status: DC

## 2013-10-19 MED ORDER — DIPHENOXYLATE-ATROPINE 2.5-0.025 MG/5ML PO LIQD: ORAL | Status: DC

## 2013-10-19 NOTE — Assessment & Plan Note (Signed)
Follows at [facility] - will get office note

## 2013-10-19 NOTE — Assessment & Plan Note (Signed)
Increase paxil to 30mg  daily (patient to increase once current supply complete)

## 2013-10-19 NOTE — Assessment & Plan Note (Signed)
Mammo referral today, colonoscopy in 2016, defers pap today

## 2013-10-19 NOTE — Patient Instructions (Addendum)
-  Please be sure to see Dr. Naaman Plummer for pain management -Today I am referring you for your mammogram -We are going to check your cholesterol today -Please be sure to see Dr. Ricki Miller and have results of that visit sent to [place] -I have refilled your diarrhea medicine -Please increase your Paxil to 30mg  daily - you may start this after you complete your current supply of 20mg  pills  Please be sure to bring all of your medications with you to every visit.  Should you have any new or worsening symptoms, please be sure to call the clinic at [phone].

## 2013-10-19 NOTE — Progress Notes (Signed)
Rx for generic Lomotil called in to [facility] per Dr Burnard Bunting. Hilda Blades Pratham Cassatt RN 10/19/13 1:40PM

## 2013-10-19 NOTE — Assessment & Plan Note (Addendum)
Patient instructed to follow with Dr. Naaman Plummer  ADDENDUM: it seems patient was d/c from Dr. Naaman Plummer due to inconsistent UDS - given her reported confusion and managing without narcotic pain meds for the last few months, will hold off on prescribing medications - may take tylenol prn

## 2013-10-19 NOTE — Progress Notes (Signed)
Pt has been discharged from Dr Naaman Plummer practice 02/2013 - Dr Burnard Bunting aware. Hilda Blades Magic Mohler RN 10/19/13 11AM

## 2013-10-19 NOTE — Progress Notes (Signed)
Case discussed with Dr. Sharda soon after the resident saw the patient.  We reviewed the resident's history and exam and pertinent patient test results.  I agree with the assessment, diagnosis, and plan of care documented in the resident's note. 

## 2013-10-19 NOTE — Assessment & Plan Note (Addendum)
Unchanged, due for colon in 2016 (last in 2013) - refilled lomotil
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
## 2013-10-19 NOTE — Assessment & Plan Note (Addendum)
Lab Results  Component Value Date   HGBA1C 4.9 10/19/2013   HGBA1C 5.3 02/09/2013   HGBA1C 5.4 11/10/2012     Assessment: Diabetes control:  controlled  Progress toward A1C goal:   at goal Comments: Not on insulin anymore  Plan: Medications:  diet controlled (decreased appetite in the setting of CKD) Home glucose monitoring: Frequency:   Timing:   Instruction/counseling given: reminded to get eye exam Educational resources provided: brochure;handout Lipid today, other labs at [place]

## 2013-10-24 ENCOUNTER — Other Ambulatory Visit: Payer: Self-pay | Admitting: Internal Medicine

## 2013-10-27 NOTE — Telephone Encounter (Signed)
Opened in error

## 2013-11-02 ENCOUNTER — Encounter: Payer: Self-pay | Admitting: Podiatry

## 2013-11-02 ENCOUNTER — Ambulatory Visit (INDEPENDENT_AMBULATORY_CARE_PROVIDER_SITE_OTHER): Payer: Medicaid Other | Admitting: Podiatry

## 2013-11-02 VITALS — BP 148/70 | HR 74 | Resp 12

## 2013-11-02 DIAGNOSIS — L97509 Non-pressure chronic ulcer of other part of unspecified foot with unspecified severity: Secondary | ICD-10-CM

## 2013-11-02 NOTE — Progress Notes (Signed)
Patient ID: [name] [name], female   DOB: May 13, 1951, 63 y.o.   MRN: [id number]  Subjective: This known diabetic patient presents for ongoing care for skin ulcer on the plantar right foot at approximately 2-4 week intervals. Patient has had a left lower leg amputation  Objective: Black female orientated x3 A hemorrhagic hyperkeratotic lesion plantar fifth right MPJ that remains closed after debridement.  Assessment: Pre-ulcerative hyperkeratotic lesion plantar fifth right MPJ  Plan: Debridement of hyperkeratotic tissue plantar fifth right MPJ without a bleeding. Patient will continue to wear her diabetic shoe on the right foot with a Plastizote insole with an additional felt pad to offload the fifth right MPJ.  Reappoint x4 weeks or sooner if patient has concern.

## 2013-11-29 ENCOUNTER — Telehealth: Payer: Self-pay | Admitting: *Deleted

## 2013-11-29 NOTE — Telephone Encounter (Signed)
Pt states she came for her appt today and was told DR Amalia Hailey is not in today.  Pt states she has the scheduling card and our office wrote the appt for today.  Pt wants a callback.  Referred to schedulers.

## 2013-11-29 NOTE — Telephone Encounter (Signed)
[name] [name] spoke to patient and got her scheduled.

## 2013-11-30 ENCOUNTER — Ambulatory Visit (INDEPENDENT_AMBULATORY_CARE_PROVIDER_SITE_OTHER): Payer: Medicaid Other | Admitting: Podiatry

## 2013-11-30 ENCOUNTER — Ambulatory Visit: Payer: Medicaid Other | Admitting: Podiatry

## 2013-11-30 ENCOUNTER — Encounter: Payer: Self-pay | Admitting: Podiatry

## 2013-11-30 VITALS — BP 152/70 | HR 67 | Resp 12

## 2013-11-30 DIAGNOSIS — L97509 Non-pressure chronic ulcer of other part of unspecified foot with unspecified severity: Secondary | ICD-10-CM

## 2013-11-30 DIAGNOSIS — B351 Tinea unguium: Secondary | ICD-10-CM

## 2013-11-30 DIAGNOSIS — I739 Peripheral vascular disease, unspecified: Secondary | ICD-10-CM

## 2013-12-01 NOTE — Progress Notes (Signed)
Patient ID: [name] [name], female   DOB: [birth year], 62 y.o.   MRN: [id number]  Subjective: This known diabetic patient presents for ongoing care for skin ulcer on the plantar right foot at approximately 2-4 week intervals. Patient has had a left lower leg amputation   Objective: Black female orientated x3  A hemorrhagic hyperkeratotic lesion plantar fifth right MPJ that remains closed after debridement.    Assessment: Pre-ulcerative hyperkeratotic lesion plantar fifth right MPJ   Plan: Debridement of hyperkeratotic tissue plantar fifth right MPJ without a bleeding. Patient will continue to wear her diabetic shoe on the right foot with a Plastizote insole with an additional felt pad to offload the fifth right MPJ.   Reappoint x4 weeks or sooner if patient has concern.

## 2013-12-05 ENCOUNTER — Ambulatory Visit: Payer: Medicaid Other | Admitting: Podiatry

## 2014-01-02 ENCOUNTER — Ambulatory Visit (INDEPENDENT_AMBULATORY_CARE_PROVIDER_SITE_OTHER): Payer: Medicaid Other | Admitting: Podiatry

## 2014-01-02 ENCOUNTER — Encounter: Payer: Self-pay | Admitting: Podiatry

## 2014-01-02 VITALS — BP 144/76 | HR 60 | Resp 12

## 2014-01-02 DIAGNOSIS — L97509 Non-pressure chronic ulcer of other part of unspecified foot with unspecified severity: Secondary | ICD-10-CM

## 2014-01-03 NOTE — Progress Notes (Signed)
Patient ID: FATMATA MANO, female   DOB: [birth year], 63 y.o.   MRN: [id number]  Subjective: This patient presents for ongoing care for pre-ulcerative and ulcerative callus plantar fifth right MPJ. She's undergone left BK amputation.  Objective: Orientated x31 63 year old black female  The plantar fifth right MPJ as well-organized hemorrhagic keratoses the remains closed after debridement.  Assessment: Pre-ulcerative hemorrhagic keratoses plantar fifth right MPJ Diabetic with peripheral arterial disease  Plan: The hyperkeratotic tissue is debrided without any bleeding. Patient will continue to wear her diabetic shoe with the custom Plastizote insole on the right foot with an additional felt pad to offload the fifth right MPJ area. This wound appears to be stable.  Reappoint x4 weeks or sooner if patient has concern

## 2014-01-24 ENCOUNTER — Other Ambulatory Visit: Payer: Self-pay | Admitting: Internal Medicine

## 2014-01-27 ENCOUNTER — Ambulatory Visit: Payer: Medicaid Other | Admitting: Internal Medicine

## 2014-01-27 ENCOUNTER — Encounter: Payer: Self-pay | Admitting: Internal Medicine

## 2014-01-30 ENCOUNTER — Other Ambulatory Visit: Payer: Self-pay | Admitting: Internal Medicine

## 2014-01-30 ENCOUNTER — Ambulatory Visit (INDEPENDENT_AMBULATORY_CARE_PROVIDER_SITE_OTHER): Payer: Medicaid Other | Admitting: Podiatry

## 2014-01-30 ENCOUNTER — Encounter: Payer: Self-pay | Admitting: Podiatry

## 2014-01-30 VITALS — BP 176/86 | HR 60 | Temp 97.6°F | Resp 16 | Ht 62.0 in | Wt 126.0 lb

## 2014-01-30 DIAGNOSIS — L97509 Non-pressure chronic ulcer of other part of unspecified foot with unspecified severity: Secondary | ICD-10-CM

## 2014-01-30 NOTE — Progress Notes (Signed)
Patient ID: [name] [name], female   DOB: 1951-06-18, 63 y.o.   MRN: [id number]  Subjective: This patient presents for ongoing care for pre-ulcerative and ulcerative callus plantar fifth right MPJ. She's undergone left BK amputation.   Objective: Orientated x30 63 year old black female  The plantar fifth right MPJ as well-organized hemorrhagic keratoses the remains closed after debridement.   Assessment:  Pre-ulcerative hemorrhagic keratoses plantar fifth right MPJ  Diabetic with peripheral arterial disease   Plan: The hyperkeratotic tissue is debrided without any bleeding. Patient will continue to wear her diabetic shoe with the custom Plastizote insole on the right foot with an additional felt pad to offload the fifth right MPJ area. This wound appears to be stable.  Reappoint x4 weeks or sooner if patient has concern

## 2014-01-31 ENCOUNTER — Other Ambulatory Visit: Payer: Self-pay | Admitting: Internal Medicine

## 2014-02-03 ENCOUNTER — Other Ambulatory Visit: Payer: Self-pay | Admitting: Internal Medicine

## 2014-02-05 ENCOUNTER — Other Ambulatory Visit: Payer: Self-pay | Admitting: Internal Medicine

## 2014-02-06 ENCOUNTER — Other Ambulatory Visit: Payer: Self-pay | Admitting: Internal Medicine

## 2014-02-14 ENCOUNTER — Other Ambulatory Visit: Payer: Self-pay | Admitting: Internal Medicine

## 2014-02-14 NOTE — Telephone Encounter (Signed)
Flag sent to front desk pool for appt per Dr Burnard Bunting  - no future appt with [facility] seen in [place].

## 2014-02-21 ENCOUNTER — Other Ambulatory Visit: Payer: Self-pay | Admitting: Internal Medicine

## 2014-02-22 ENCOUNTER — Other Ambulatory Visit: Payer: Self-pay | Admitting: Internal Medicine

## 2014-02-27 ENCOUNTER — Encounter: Payer: Self-pay | Admitting: Podiatry

## 2014-02-27 ENCOUNTER — Ambulatory Visit (INDEPENDENT_AMBULATORY_CARE_PROVIDER_SITE_OTHER): Payer: Medicaid Other | Admitting: Podiatry

## 2014-02-27 VITALS — BP 162/81 | HR 75 | Temp 98.4°F | Resp 14 | Ht 62.0 in | Wt 126.0 lb

## 2014-02-27 DIAGNOSIS — B351 Tinea unguium: Secondary | ICD-10-CM

## 2014-02-27 DIAGNOSIS — I739 Peripheral vascular disease, unspecified: Secondary | ICD-10-CM

## 2014-02-27 DIAGNOSIS — L97509 Non-pressure chronic ulcer of other part of unspecified foot with unspecified severity: Secondary | ICD-10-CM

## 2014-02-27 DIAGNOSIS — M79609 Pain in unspecified limb: Secondary | ICD-10-CM

## 2014-02-27 NOTE — Progress Notes (Signed)
Patient ID: [name] [name], female   DOB: 1951/02/02, 63 y.o.   MRN: [id number] Subjective: 63 year old diabetic black female presents for ongoing ulcer care in the right foot and debridement of symptomatic toenails.  Objective: BK amputation left Nails one through 5 right are hypertrophic, elongated, discolored Plantar callus fifth right MPJ as punctate hemorrhagic area centrally located Patient is wearing prosthetic limb on the left extremity today  Assessment: Symptomatic onychomycoses 1 through 5 Pre-ulcerative hyperkeratotic lesion sub-fifth right MPJ Peripheral arterial disease  Plan: Debridement toenails x5 Debridement and pre-ulcerative keratoses x1  Patient will continue wearing diabetic shoe on the right with a additional felt pad to offload the right fifth MPJ

## 2014-02-28 ENCOUNTER — Telehealth: Payer: Self-pay | Admitting: Internal Medicine

## 2014-02-28 NOTE — Telephone Encounter (Signed)
Called patient this afternoon about setting up an appt next week with Dr. Burnard Bunting.  Patient stated to me that she already had appts booked for next week.  Then asked her when she would be able to come in for an appt and response to that was she would call me back.  Let her know that her meds may not be refilled in the future if she does not come in if for a doctor's visit.  Replied okay.

## 2014-03-27 ENCOUNTER — Encounter: Payer: Self-pay | Admitting: Podiatry

## 2014-03-27 ENCOUNTER — Ambulatory Visit (INDEPENDENT_AMBULATORY_CARE_PROVIDER_SITE_OTHER): Payer: Medicaid Other | Admitting: Podiatry

## 2014-03-27 VITALS — BP 185/94 | HR 60 | Temp 97.6°F | Resp 16 | Ht 62.0 in | Wt 125.0 lb

## 2014-03-27 DIAGNOSIS — L97509 Non-pressure chronic ulcer of other part of unspecified foot with unspecified severity: Secondary | ICD-10-CM

## 2014-03-28 NOTE — Progress Notes (Signed)
Patient ID: [name] [name], female   DOB: Jan 18, 1951, 63 y.o.   MRN: [id number]  Objective: 63 year old black diabetic female presents for followup care for pre-ulcerative  keratoses on the right foot  Objective: BK amputation left Hemorrhagic keratoses fifth right MPJ there remains closed after debridement  Assessment: Pre-ulcerative hyperkeratotic lesion plantar right  Plan: Pre-ulcerative f keratoses was debrided Patient will continue to wear her diabetic shoe with an additional felt pad attached to offload the fifth right MPJ area  Reappoint x4 weeks

## 2014-04-24 ENCOUNTER — Ambulatory Visit (INDEPENDENT_AMBULATORY_CARE_PROVIDER_SITE_OTHER): Payer: Medicaid Other | Admitting: Podiatry

## 2014-04-24 ENCOUNTER — Encounter: Payer: Self-pay | Admitting: Podiatry

## 2014-04-24 VITALS — BP 160/72 | HR 65 | Temp 97.6°F | Resp 18

## 2014-04-24 DIAGNOSIS — E1149 Type 2 diabetes mellitus with other diabetic neurological complication: Secondary | ICD-10-CM

## 2014-04-24 DIAGNOSIS — B351 Tinea unguium: Secondary | ICD-10-CM

## 2014-04-24 DIAGNOSIS — L97511 Non-pressure chronic ulcer of other part of right foot limited to breakdown of skin: Secondary | ICD-10-CM

## 2014-04-24 DIAGNOSIS — E1122 Type 2 diabetes mellitus with diabetic chronic kidney disease: Secondary | ICD-10-CM

## 2014-04-24 DIAGNOSIS — I739 Peripheral vascular disease, unspecified: Secondary | ICD-10-CM

## 2014-04-24 DIAGNOSIS — L97509 Non-pressure chronic ulcer of other part of unspecified foot with unspecified severity: Secondary | ICD-10-CM

## 2014-04-24 NOTE — Progress Notes (Signed)
Patient ID: [name] [name], female   DOB: 06/14/1951, 63 y.o.   MRN: [id number] Subjective: This patient presents for ongoing care for pre-ulcerative in ulcerative callus plantar fifth MPJ and brought him to mycotic toenails.  Objective: Toenails 1-5 right hypertrophic, elongated, incurvated and tender Bleeding plantar callus fifth right MPJ BK amputation left  Assessment: Symptomatic onychomycosis 1-5 Pre-ulcerative callus plantar fifth right MPJ Peripheral arterial disease associated with diabetes and smoking  Plan: Nails 1-5 are derived on bleeding Bleeding tells the right fifth right MPJ without any bleeding  Patient advised to contact primary care physician to request replacement diabetic shoes Patient has diabetic shoe on the right with an attached felt pad to offload the plantar fifth right MPJ area  We appointment x4 weeks

## 2014-05-08 ENCOUNTER — Encounter: Payer: Self-pay | Admitting: Podiatry

## 2014-05-08 ENCOUNTER — Ambulatory Visit (INDEPENDENT_AMBULATORY_CARE_PROVIDER_SITE_OTHER): Payer: Medicaid Other | Admitting: Podiatry

## 2014-05-08 ENCOUNTER — Telehealth: Payer: Self-pay | Admitting: *Deleted

## 2014-05-08 VITALS — BP 179/95 | HR 62 | Temp 98.0°F | Resp 16 | Ht 62.0 in | Wt 126.0 lb

## 2014-05-08 DIAGNOSIS — L97511 Non-pressure chronic ulcer of other part of right foot limited to breakdown of skin: Secondary | ICD-10-CM

## 2014-05-08 DIAGNOSIS — L97509 Non-pressure chronic ulcer of other part of unspecified foot with unspecified severity: Secondary | ICD-10-CM

## 2014-05-08 NOTE — Telephone Encounter (Signed)
Do I have an appointment today?  Please give me a call.  Patient came for her appointment.

## 2014-05-09 NOTE — Progress Notes (Signed)
Patient ID: [name] [name], female   DOB: 1951/04/24, 63 y.o.   MRN: [id number] Subjective: Patient presents for ongoing care for pre-ulcerative keratoses plantar fifth right MPJ  Objective: BK amputation left Bleeding callus fifth right MPJ  Assessment: Pre-ulcerative hyperkeratotic tissue plantar fifth right MPJ  Plan: Debridement o pre-ulcerative f keratoses  Maintaine diabetic shoe with cutout pad attached to insoles to offload the fifth right MPJ area  Reappoint x4 weeks

## 2014-05-31 ENCOUNTER — Ambulatory Visit (INDEPENDENT_AMBULATORY_CARE_PROVIDER_SITE_OTHER): Payer: Medicaid Other | Admitting: Internal Medicine

## 2014-05-31 ENCOUNTER — Encounter: Payer: Self-pay | Admitting: *Deleted

## 2014-05-31 ENCOUNTER — Encounter: Payer: Self-pay | Admitting: Internal Medicine

## 2014-05-31 VITALS — BP 180/78 | HR 61 | Temp 97.0°F | Ht 62.0 in | Wt 123.1 lb

## 2014-05-31 DIAGNOSIS — E1129 Type 2 diabetes mellitus with other diabetic kidney complication: Secondary | ICD-10-CM

## 2014-05-31 DIAGNOSIS — R0602 Shortness of breath: Secondary | ICD-10-CM

## 2014-05-31 DIAGNOSIS — E1122 Type 2 diabetes mellitus with diabetic chronic kidney disease: Secondary | ICD-10-CM

## 2014-05-31 DIAGNOSIS — I10 Essential (primary) hypertension: Secondary | ICD-10-CM

## 2014-05-31 DIAGNOSIS — T8789 Other complications of amputation stump: Secondary | ICD-10-CM

## 2014-05-31 DIAGNOSIS — N189 Chronic kidney disease, unspecified: Secondary | ICD-10-CM

## 2014-05-31 DIAGNOSIS — M79609 Pain in unspecified limb: Secondary | ICD-10-CM

## 2014-05-31 DIAGNOSIS — E559 Vitamin D deficiency, unspecified: Secondary | ICD-10-CM

## 2014-05-31 LAB — GLUCOSE, CAPILLARY: Glucose-Capillary: 78 mg/dL (ref 70–99)

## 2014-05-31 LAB — POCT GLYCOSYLATED HEMOGLOBIN (HGB A1C): Hemoglobin A1C: 5.1

## 2014-05-31 MED ORDER — ALBUTEROL SULFATE HFA 108 (90 BASE) MCG/ACT IN AERS: 2.0000 | INHALATION_SPRAY | Freq: Four times a day (QID) | RESPIRATORY_TRACT | Status: DC | PRN

## 2014-05-31 MED ORDER — VITAMIN D (ERGOCALCIFEROL) 1.25 MG (50000 UNIT) PO CAPS: 50000.0000 [IU] | ORAL_CAPSULE | ORAL | Status: DC

## 2014-05-31 MED ORDER — FUROSEMIDE 40 MG PO TABS: 40.0000 mg | ORAL_TABLET | Freq: Every day | ORAL | Status: DC

## 2014-05-31 MED ORDER — CARVEDILOL 12.5 MG PO TABS: 12.5000 mg | ORAL_TABLET | Freq: Two times a day (BID) | ORAL | Status: DC

## 2014-05-31 MED ORDER — IBUPROFEN 600 MG PO TABS: 600.0000 mg | ORAL_TABLET | Freq: Two times a day (BID) | ORAL | Status: DC | PRN

## 2014-05-31 MED ORDER — ISOSORBIDE MONONITRATE ER 60 MG PO TB24: 60.0000 mg | ORAL_TABLET | Freq: Every day | ORAL | Status: DC

## 2014-05-31 NOTE — Patient Instructions (Signed)
It was a pleasure meeting you today. I refilled your Lasix, Coreg, Vit D, and albuterol.   We will see you in one month to check your blood pressure.   Start taking ibuprofen 600mg  twice a day for 5 days for your left leg pain.

## 2014-05-31 NOTE — Progress Notes (Signed)
Subjective:     Patient ID: [name] [name], female   DOB: January 14, 1951, 63 y.o.   MRN: [id number]  HPI Pt is a 63 y/o female w/ PMHx of PVD, HTN, CKD3, lt BKA, and DM2 (diet controlled) who presents to clinic for lt stump pain and medication refills. Pt has a prosthetic lt leg that has started causing her stump pain 2/2 shrinking of stump. She last saw her prosthetist, Willette Alma, 2 weeks ago for adjustment of her prosthetic leg. She was informed at that time that he does not give pain medications and advised to see her PCP for pain meds. She has tried tylenol and ice packs w/o relief.  She is requesting refills of all her medications. Her med list was reconciled today. Appears that patient is not taking her medications as directed. She has stopped taking lasix x 1 year, paxil, Vit D and crestor (which was found per chart review and not on her med list thus was not refilled).   Her HbA1c was 5.1 in clinic today and she is not on any medications for her diabetes. She reports that she has about 2 hypoglycemic episodes per month and will drink juice or eat candy when this occurs. She is refusing urine sample for urine alb/creatinine b/c she does not want to be late for her public transportation.     Review of Systems  Respiratory: Negative for shortness of breath.   Cardiovascular: Positive for chest pain.  Gastrointestinal: Negative for diarrhea.  Musculoskeletal:       Lt stump pain        Objective:   Physical Exam  Constitutional: She appears well-developed and well-nourished.  In wheelchair   Cardiovascular: Normal rate and regular rhythm.   Pulmonary/Chest: Effort normal and breath sounds normal.  Abdominal: Soft. Bowel sounds are normal.  Musculoskeletal:  Lt BKA stump well healed with anterior calloused darkened skin and on the end of the stump are well healing scabs. Tender to palpation of darkened calloused area. No erythema noted       Assessment:     Please see problem based  assessment and plan.       Plan:     Please see problem based assessment and plan.

## 2014-06-01 DIAGNOSIS — E559 Vitamin D deficiency, unspecified: Secondary | ICD-10-CM | POA: Insufficient documentation

## 2014-06-01 DIAGNOSIS — R0602 Shortness of breath: Secondary | ICD-10-CM | POA: Insufficient documentation

## 2014-06-01 MED ORDER — VITAMIN D (ERGOCALCIFEROL) 1.25 MG (50000 UNIT) PO CAPS: 50000.0000 [IU] | ORAL_CAPSULE | ORAL | Status: DC

## 2014-06-01 NOTE — Assessment & Plan Note (Signed)
Pt was non compliant with Vit D 50,000 units once a week b/c medication was too expensive. Last Vit D was 26 on 07/2012. Initially refilled Vit D 50,000 units once a week 30 tabs w/ 1 refill. Called her pharmacy to adjust prescription to 8 tablets and no refills to ensure pt does not take more than needed. Will recheck Vit D in 3 months which should be 1 month after completing her Vit D 50,000 course x 8 weeks for Vit D repletion. If Vit D is still low will start on 800 units Vit D daily. Will check calcium, phos, and Mg++ at next visit as well.

## 2014-06-01 NOTE — Progress Notes (Signed)
I saw and evaluated the patient. I personally confirmed the key portions of Dr. Caron Presume history and exam and reviewed pertinent patient test results. The assessment, diagnosis, and plan were formulated together and I agree with the documentation in the resident's note.  We will explore why she is having hypoglycemic episodes if she is on no diabetic therapy, should this continue.  I suspect she is attributing other symptoms to hypoglycemia, when in fact, she is probably not hypoglycemic.

## 2014-06-01 NOTE — Assessment & Plan Note (Signed)
Pt uses albuterol inhaler about once a week. No documented hx of asthma but does have a strong smoking hx. Given refill of albuterol inhaler and will f/u in 3 months.

## 2014-06-01 NOTE — Assessment & Plan Note (Addendum)
Lab Results  Component Value Date   HGBA1C 5.1 05/31/2014   HGBA1C 4.9 10/19/2013   HGBA1C 5.3 02/09/2013     Assessment: Diabetes control:  well controlled Progress toward A1C goal:   at goal Comments: diet controlled. Not on insulin but reports about 2 hypoglycemic episodes per month.   Plan: Medications:  Continue w/ diet control of diabetes Home glucose monitoring: none Instruction/counseling given: discussed hypoglycemic events and patient knows to drink juice and eat candy when she feels hypoglycemic Other plans: will f/u in 3 months and repeat hbA1c. She follows w/ podiatry, last seen on 05/08/14, thus DM foot exam was held off today. She follows w/ opthalmology for her DM retinopathy and cataracts.

## 2014-06-01 NOTE — Addendum Note (Signed)
Addended by: Oval Linsey D on: 06/01/2014 09:43 AM   Modules accepted: Level of Service

## 2014-06-01 NOTE — Assessment & Plan Note (Signed)
Pt requesting pain medications for lt stump pain from ill fitting prosthetic. She does get her prosthetic adjusted and sees Willette Alma who is her prosthetist for this. Last adjustment was 2 weeks ago. She has tried tylenol w/o any relief as well as ice packs which she says aggrevates pain. Has documented questionable UDS in the past.   - rx for ibuprofen 600mg  BID x 5 days. If does not control pain can try tramadol.

## 2014-06-01 NOTE — Assessment & Plan Note (Signed)
BP Readings from Last 3 Encounters:  05/31/14 180/78  05/08/14 179/95  04/24/14 160/72    Lab Results  Component Value Date   NA 139 02/09/2013   K 4.4 02/09/2013   CREATININE 2.26* 02/09/2013    Assessment: Blood pressure control:  not at goal  Progress toward BP goal:   deteriorated  Comments:  non compliant w/ meds. Has been out of lasix for 1 year and out of imdur x 5 days. Should be on lasix 40mg  once a day, coreg 12.5mg  BID, and imdur 60mg  once a day  Plan: Medications:  continue current medications Other plans: will f/u in 3 months for BP check, BMP, and to ensure medication compliance

## 2014-06-07 ENCOUNTER — Encounter: Payer: Self-pay | Admitting: Podiatry

## 2014-06-07 ENCOUNTER — Ambulatory Visit (INDEPENDENT_AMBULATORY_CARE_PROVIDER_SITE_OTHER): Payer: Medicaid Other | Admitting: Podiatry

## 2014-06-07 VITALS — BP 160/84 | HR 66 | Resp 12

## 2014-06-07 DIAGNOSIS — L97509 Non-pressure chronic ulcer of other part of unspecified foot with unspecified severity: Secondary | ICD-10-CM

## 2014-06-07 DIAGNOSIS — L97511 Non-pressure chronic ulcer of other part of right foot limited to breakdown of skin: Secondary | ICD-10-CM

## 2014-06-07 DIAGNOSIS — I739 Peripheral vascular disease, unspecified: Secondary | ICD-10-CM

## 2014-06-07 DIAGNOSIS — B351 Tinea unguium: Secondary | ICD-10-CM

## 2014-06-08 ENCOUNTER — Other Ambulatory Visit: Payer: Self-pay | Admitting: Physical Medicine & Rehabilitation

## 2014-06-08 NOTE — Progress Notes (Signed)
Patient ID: MELINE SWIDER, female   DOB: December 02, 1950, 63 y.o.   MRN: [id number]  Subjective: This patient presents for ongoing maintenance for pre-ulcerative keratoses on the right foot. She also complaining of thickened toenails on the right foot  Objective: BK amputation left The distal right hallux has hemorrhagic callus that remains closed after debridement Plantar fifth right MPJ has hemorrhagic callus that remains closed after debridement Toenails 1-5 right are elongated, brittle, discolored  Assessment: Pre-ulcerative hyperkeratoses distal right hallux and plantar fifth right MPJ  Onychomycoses one through 5 right Peripheral arterial disease  Plan: The pre-ulcerative hyperkeratotic tissues debrided Nails 1-5 are debrided  Patient wear diabetic shoe with Plastizote insoles on the right foot  Reappoint x2 weeks

## 2014-06-21 ENCOUNTER — Ambulatory Visit (INDEPENDENT_AMBULATORY_CARE_PROVIDER_SITE_OTHER): Payer: Medicaid Other | Admitting: Podiatry

## 2014-06-21 DIAGNOSIS — L97511 Non-pressure chronic ulcer of other part of right foot limited to breakdown of skin: Secondary | ICD-10-CM

## 2014-06-21 DIAGNOSIS — L97509 Non-pressure chronic ulcer of other part of unspecified foot with unspecified severity: Secondary | ICD-10-CM

## 2014-06-21 NOTE — Patient Instructions (Signed)
Avoid the use of a prosthetic leg Use a wheelchair as much possible and reduce standing
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
## 2014-06-28 ENCOUNTER — Encounter: Payer: Self-pay | Admitting: Podiatry

## 2014-06-28 ENCOUNTER — Telehealth: Payer: Self-pay | Admitting: *Deleted

## 2014-06-28 ENCOUNTER — Ambulatory Visit (INDEPENDENT_AMBULATORY_CARE_PROVIDER_SITE_OTHER): Payer: Medicaid Other | Admitting: Podiatry

## 2014-06-28 VITALS — BP 140/70 | HR 68 | Resp 12

## 2014-06-28 DIAGNOSIS — L97509 Non-pressure chronic ulcer of other part of unspecified foot with unspecified severity: Secondary | ICD-10-CM

## 2014-06-28 NOTE — Patient Instructions (Signed)
Do not use prosthetic limb Limit the amount of standing walking is much as possible

## 2014-06-28 NOTE — Telephone Encounter (Signed)
Would you see if I can get one of those surgical shoes to wear for a while instead of this Diabetic shoe.  Maybe that will help.  You know what I'm talking about, I had a surgical shoe before that I got from there.  These Diabetic shoes are kind of hard anyway.  Give me a call back.  Thank you, have a blessed day.

## 2014-06-28 NOTE — Progress Notes (Signed)
Patient ID: [name] [name], female   DOB: 05-22-1951, 62 y.o.   MRN: [id number]  Subjective: This patient presents for ongoing ulcerative care on the right foot  Objective: BK amputation left 10 mm hemorrhagic callus plantar fifth right MPJ that has fragile closer after debridement  Assessment: Pre-ulcerative hyperkeratotic tissue sub-fifth right MPJ  Plan: Debride hyperkeratotic tissue and pre-ulcerative tissue Patient will wear diabetic shoe with an additional felt pad attached to the plantar fifth right MPJ area Patient advised to limit standing and walking and not to use prosthetic limb  If plantar hyperkeratotic tissue remain stable after visit in 7 days I will extend visits between debridement x2 weeks  Reappoint x one week

## 2014-07-04 NOTE — Telephone Encounter (Signed)
I called and informed her that Medicaid doesn't cover the [employer] shoe.  She stated, "Medicaid won't pay for it, never mind."

## 2014-07-10 ENCOUNTER — Ambulatory Visit (INDEPENDENT_AMBULATORY_CARE_PROVIDER_SITE_OTHER): Payer: Medicaid Other | Admitting: Podiatry

## 2014-07-10 ENCOUNTER — Encounter: Payer: Self-pay | Admitting: Podiatry

## 2014-07-10 VITALS — BP 170/80 | HR 62 | Resp 14

## 2014-07-10 DIAGNOSIS — L97511 Non-pressure chronic ulcer of other part of right foot limited to breakdown of skin: Secondary | ICD-10-CM

## 2014-07-10 NOTE — Progress Notes (Signed)
   Subjective:    Patient ID: [name] [name], female    DOB: Dec 11, 1950, 63 y.o.   MRN: [id number]  HPI This patient presents for ongoing debridement of pre-ulcerative in ulcerative keratoses on the right foot   Review of Systems  All other systems reviewed and are negative.      Objective:   Physical Exam  BK amputation left Hemorrhagic plantar keratoses right fifth MPJ has fragile closer after debridement Distal keratoses right hallux       Assessment & Plan:   Assessment: Pre-ulcerative keratoses plantar fifth right MPJ Keratoses distal right hallux  Plan: Debrided pre-ulcerative and not ulcerative keratoses on right foot  Patient will continue wearing diabetic shoe with additional felt pad attached offload fifth right MPJ Advised patient to limit the use a prosthetic limb left and to use wheelchair is much as possible   reappoint x2 weeks

## 2014-07-24 ENCOUNTER — Ambulatory Visit (INDEPENDENT_AMBULATORY_CARE_PROVIDER_SITE_OTHER): Payer: Medicaid Other | Admitting: Podiatry

## 2014-07-24 ENCOUNTER — Encounter: Payer: Self-pay | Admitting: Podiatry

## 2014-07-24 VITALS — BP 165/78 | HR 65 | Resp 15

## 2014-07-24 DIAGNOSIS — B351 Tinea unguium: Secondary | ICD-10-CM

## 2014-07-24 DIAGNOSIS — M79671 Pain in right foot: Secondary | ICD-10-CM

## 2014-07-24 DIAGNOSIS — L97511 Non-pressure chronic ulcer of other part of right foot limited to breakdown of skin: Secondary | ICD-10-CM

## 2014-07-24 NOTE — Progress Notes (Signed)
Patient ID: [name] [name], female   DOB: 1951-06-16, 63 y.o.   MRN: [id number]  Subjective: This patient presents for ongoing care for pre-ulcerative in ulcerative plantar keratoses sub-fifth right MPJ and is also complaining of uncomfortable toenails on the right foot.  Objective: BK amputation left The toenails one through 5 right are elongated, hypertrophic, incurvated, discolored Plantar keratoses sub-fifth right MPJ has hemorrhagic debris with fragile closure after debridement  Assessment: Symptomatic onychomycosis 1 through 5 Pre-ulcerative hyperkeratoses fifth right MPJ  Plan: Debrided toenails 5 without any bleeding Debrided pre-ulcerative hyperkeratoses  Patient will continue to wear diabetic shoes with custom insoles and additional felt pad to offload fifth right MPJ  Reappoint 2 months

## 2014-07-31 ENCOUNTER — Other Ambulatory Visit: Payer: Self-pay | Admitting: Physical Medicine & Rehabilitation

## 2014-08-05 ENCOUNTER — Other Ambulatory Visit: Payer: Self-pay | Admitting: Internal Medicine

## 2014-08-07 ENCOUNTER — Ambulatory Visit (INDEPENDENT_AMBULATORY_CARE_PROVIDER_SITE_OTHER): Payer: Medicaid Other | Admitting: Podiatry

## 2014-08-07 ENCOUNTER — Encounter: Payer: Self-pay | Admitting: Podiatry

## 2014-08-07 VITALS — BP 131/66 | HR 64 | Temp 96.6°F | Resp 13

## 2014-08-07 DIAGNOSIS — L97511 Non-pressure chronic ulcer of other part of right foot limited to breakdown of skin: Secondary | ICD-10-CM

## 2014-08-07 NOTE — Patient Instructions (Signed)
If there is no drainage from the skin ulcer on the right foot okay to apply Vaseline ointment and wear white cotton sock

## 2014-08-08 NOTE — Progress Notes (Signed)
Patient ID: [name] [name], female   DOB: 06/30/1951, 63 y.o.   MRN: [id number]  Subjective: This patient presents for ongoing care for pre-ulcerative plantar keratoses sub-fifth right MPJ. She is wearing her prosthetic limb a only occasionally. Since she's been reducing her standing walking time the plantar lesion on the right foot has become less reactive.  Objective: BK amputation left Plantar keratoses fifth right MPJ with bleeding in central aspect of keratoses without active break down of the keratoses after debridement  Assessment: Pre-ulcerative hyperkeratoses with high probability of ulceration Weight transfer right because of BK amputation left  Plan: Debrided keratoses right Maintain diabetic shoe with custom insole with an additional felt pad to offload the fifth right MPJ Limit the amount of standing walking and use of prosthetic limb left  Reappoint x 3 weeks

## 2014-08-28 ENCOUNTER — Encounter: Payer: Self-pay | Admitting: Podiatry

## 2014-08-28 ENCOUNTER — Ambulatory Visit (INDEPENDENT_AMBULATORY_CARE_PROVIDER_SITE_OTHER): Payer: Medicaid Other | Admitting: Podiatry

## 2014-08-28 VITALS — BP 175/84 | HR 65 | Temp 98.6°F | Resp 16

## 2014-08-28 DIAGNOSIS — E1142 Type 2 diabetes mellitus with diabetic polyneuropathy: Secondary | ICD-10-CM | POA: Insufficient documentation

## 2014-08-28 DIAGNOSIS — G629 Polyneuropathy, unspecified: Secondary | ICD-10-CM

## 2014-08-28 DIAGNOSIS — L84 Corns and callosities: Secondary | ICD-10-CM

## 2014-08-28 NOTE — Progress Notes (Signed)
Patient ID: [name] [name], female   DOB: 1950/10/14, 63 y.o.   MRN: [id number] Subjective: This patient presents for ongoing care for pre-ulcerative in ulcerative skin lesions associated with diabetes  Objective: Distal keratoses right hallux Bleeding callus sub-fifth right MPJ  Assessment: History of diabetic neuropathy History of peripheral arterial disease  Plan: Debrided pre-ulcerative keratoses plantar fifth right MPJ and keratoses distal right hallux without bleeding  Maintain diabetic shoe with pocket to offload fifth right MPJ  Reappoint 3 months

## 2014-09-06 ENCOUNTER — Encounter: Payer: Self-pay | Admitting: Family

## 2014-09-07 ENCOUNTER — Other Ambulatory Visit: Payer: Self-pay | Admitting: Family

## 2014-09-07 ENCOUNTER — Encounter (HOSPITAL_COMMUNITY): Payer: Self-pay | Admitting: Vascular Surgery

## 2014-09-07 ENCOUNTER — Ambulatory Visit (HOSPITAL_COMMUNITY)
Admission: RE | Admit: 2014-09-07 | Discharge: 2014-09-07 | Disposition: A | Discharge status: Home / Self Care | Payer: Medicaid Other | Source: Ambulatory Visit | Attending: Family | Admitting: Family

## 2014-09-07 ENCOUNTER — Ambulatory Visit (INDEPENDENT_AMBULATORY_CARE_PROVIDER_SITE_OTHER)
Admission: RE | Admit: 2014-09-07 | Discharge: 2014-09-07 | Disposition: A | Discharge status: Home / Self Care | Payer: Medicaid Other | Source: Ambulatory Visit | Attending: Family | Admitting: Family

## 2014-09-07 ENCOUNTER — Ambulatory Visit (INDEPENDENT_AMBULATORY_CARE_PROVIDER_SITE_OTHER): Payer: Medicaid Other | Admitting: Family

## 2014-09-07 VITALS — BP 182/84 | HR 60 | Resp 16 | Ht 62.0 in | Wt 126.0 lb

## 2014-09-07 DIAGNOSIS — I739 Peripheral vascular disease, unspecified: Secondary | ICD-10-CM

## 2014-09-07 DIAGNOSIS — Z48812 Encounter for surgical aftercare following surgery on the circulatory system: Secondary | ICD-10-CM

## 2014-09-07 DIAGNOSIS — I70201 Unspecified atherosclerosis of native arteries of extremities, right leg: Secondary | ICD-10-CM | POA: Insufficient documentation

## 2014-09-07 DIAGNOSIS — M79609 Pain in unspecified limb: Secondary | ICD-10-CM

## 2014-09-07 DIAGNOSIS — L98499 Non-pressure chronic ulcer of skin of other sites with unspecified severity: Secondary | ICD-10-CM

## 2014-09-07 DIAGNOSIS — I70209 Unspecified atherosclerosis of native arteries of extremities, unspecified extremity: Secondary | ICD-10-CM

## 2014-09-07 DIAGNOSIS — R1032 Left lower quadrant pain: Secondary | ICD-10-CM | POA: Insufficient documentation
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
## 2014-09-07 NOTE — Patient Instructions (Signed)
Peripheral Vascular Disease Peripheral Vascular Disease (PVD), also called Peripheral Arterial Disease (PAD), is a circulation problem caused by cholesterol (atherosclerotic plaque) deposits in the arteries. PVD commonly occurs in the lower extremities (legs) but it can occur in other areas of the body, such as your arms. The cholesterol buildup in the arteries reduces blood flow which can cause pain and other serious problems. The presence of PVD can place a person at risk for Coronary Artery Disease (CAD).  CAUSES  Causes of PVD can be many. It is usually associated with more than one risk factor such as:   High Cholesterol.  Smoking.  Diabetes.  Lack of exercise or inactivity.  High blood pressure (hypertension).  Obesity.  Family history. SYMPTOMS   When the lower extremities are affected, patients with PVD may experience:  Leg pain with exertion or physical activity. This is called INTERMITTENT CLAUDICATION. This may present as cramping or numbness with physical activity. The location of the pain is associated with the level of blockage. For example, blockage at the abdominal level (distal abdominal aorta) may result in buttock or hip pain. Lower leg arterial blockage may result in calf pain.  As PVD becomes more severe, pain can develop with less physical activity.  In people with severe PVD, leg pain may occur at rest.  Other PVD signs and symptoms:  Leg numbness or weakness.  Coldness in the affected leg or foot, especially when compared to the other leg.  A change in leg color.  Patients with significant PVD are more prone to ulcers or sores on toes, feet or legs. These may take longer to heal or may reoccur. The ulcers or sores can become infected.  If signs and symptoms of PVD are ignored, gangrene may occur. This can result in the loss of toes or loss of an entire limb.  Not all leg pain is related to PVD. Other medical conditions can cause leg pain such  as:  Blood clots (embolism) or Deep Vein Thrombosis.  Inflammation of the blood vessels (vasculitis).  Spinal stenosis. DIAGNOSIS  Diagnosis of PVD can involve several different types of tests. These can include:  Pulse Volume Recording Method (PVR). This test is simple, painless and does not involve the use of X-rays. PVR involves measuring and comparing the blood pressure in the arms and legs. An ABI (Ankle-Brachial Index) is calculated. The normal ratio of blood pressures is 1. As this number becomes smaller, it indicates more severe disease.  < 0.95 - indicates significant narrowing in one or more leg vessels.  <0.8 - there will usually be pain in the foot, leg or buttock with exercise.  <0.4 - will usually have pain in the legs at rest.  <0.25 - usually indicates limb threatening PVD.  Doppler detection of pulses in the legs. This test is painless and checks to see if you have a pulses in your legs/feet.  A dye or contrast material (a substance that highlights the blood vessels so they show up on x-ray) may be given to help your caregiver better see the arteries for the following tests. The dye is eliminated from your body by the kidney's. Your caregiver may order blood work to check your kidney function and other laboratory values before the following tests are performed:  Magnetic Resonance Angiography (MRA). An MRA is a picture study of the blood vessels and arteries. The MRA machine uses a large magnet to produce images of the blood vessels.  Computed Tomography Angiography (CTA). A CTA   is a specialized x-ray that looks at how the blood flows in your blood vessels. An IV may be inserted into your arm so contrast dye can be injected.  Angiogram. Is a procedure that uses x-rays to look at your blood vessels. This procedure is minimally invasive, meaning a small incision (cut) is made in your groin. A small tube (catheter) is then inserted into the artery of your groin. The catheter  is guided to the blood vessel or artery your caregiver wants to examine. Contrast dye is injected into the catheter. X-rays are then taken of the blood vessel or artery. After the images are obtained, the catheter is taken out. TREATMENT  Treatment of PVD involves many interventions which may include:  Lifestyle changes:  Quitting smoking.  Exercise.  Following a low fat, low cholesterol diet.  Control of diabetes.  Foot care is very important to the PVD patient. Good foot care can help prevent infection.  Medication:  Cholesterol-lowering medicine.  Blood pressure medicine.  Anti-platelet drugs.  Certain medicines may reduce symptoms of Intermittent Claudication.  Interventional/Surgical options:  Angioplasty. An Angioplasty is a procedure that inflates a balloon in the blocked artery. This opens the blocked artery to improve blood flow.  Stent Implant. A wire mesh tube (stent) is placed in the artery. The stent expands and stays in place, allowing the artery to remain open.  Peripheral Bypass Surgery. This is a surgical procedure that reroutes the blood around a blocked artery to help improve blood flow. This type of procedure may be performed if Angioplasty or stent implants are not an option. SEEK IMMEDIATE MEDICAL CARE IF:   You develop pain or numbness in your arms or legs.  Your arm or leg turns cold, becomes blue in color.  You develop redness, warmth, swelling and pain in your arms or legs. MAKE SURE YOU:   Understand these instructions.  Will watch your condition.  Will get help right away if you are not doing well or get worse. Document Released: 10/23/2004 Document Revised: 12/08/2011 Document Reviewed: 09/19/2008 ExitCare Patient Information 2015 [employer]. This information is not intended to replace advice given to you by your health care provider. Make sure you discuss any questions you have with your health care provider.    Smoking  Cessation Quitting smoking is important to your health and has many advantages. However, it is not always easy to quit since nicotine is a very addictive drug. Oftentimes, people try 3 times or more before being able to quit. This document explains the best ways for you to prepare to quit smoking. Quitting takes hard work and a lot of effort, but you can do it. ADVANTAGES OF QUITTING SMOKING  You will live longer, feel better, and live better.  Your body will feel the impact of quitting smoking almost immediately.  Within 20 minutes, blood pressure decreases. Your pulse returns to its normal level.  After 8 hours, carbon monoxide levels in the blood return to normal. Your oxygen level increases.  After 24 hours, the chance of having a heart attack starts to decrease. Your breath, hair, and body stop smelling like smoke.  After 48 hours, damaged nerve endings begin to recover. Your sense of taste and smell improve.  After 72 hours, the body is virtually free of nicotine. Your bronchial tubes relax and breathing becomes easier.  After 2 to 12 weeks, lungs can hold more air. Exercise becomes easier and circulation improves.  The risk of having a heart attack, stroke,   cancer, or lung disease is greatly reduced.  After 1 year, the risk of coronary heart disease is cut in half.  After 5 years, the risk of stroke falls to the same as a nonsmoker.  After 10 years, the risk of lung cancer is cut in half and the risk of other cancers decreases significantly.  After 15 years, the risk of coronary heart disease drops, usually to the level of a nonsmoker.  If you are pregnant, quitting smoking will improve your chances of having a healthy baby.  The people you live with, especially any children, will be healthier.  You will have extra money to spend on things other than cigarettes. QUESTIONS TO THINK ABOUT BEFORE ATTEMPTING TO QUIT You may want to talk about your answers with your health care  provider.  Why do you want to quit?  If you tried to quit in the past, what helped and what did not?  What will be the most difficult situations for you after you quit? How will you plan to handle them?  Who can help you through the tough times? Your family? Friends? A health care provider?  What pleasures do you get from smoking? What ways can you still get pleasure if you quit? Here are some questions to ask your health care provider:  How can you help me to be successful at quitting?  What medicine do you think would be best for me and how should I take it?  What should I do if I need more help?  What is smoking withdrawal like? How can I get information on withdrawal? GET READY  Set a quit date.  Change your environment by getting rid of all cigarettes, ashtrays, matches, and lighters in your home, car, or work. Do not let people smoke in your home.  Review your past attempts to quit. Think about what worked and what did not. GET SUPPORT AND ENCOURAGEMENT You have a better chance of being successful if you have help. You can get support in many ways.  Tell your family, friends, and coworkers that you are going to quit and need their support. Ask them not to smoke around you.  Get individual, group, or telephone counseling and support. Programs are available at local hospitals and health centers. Call your local health department for information about programs in your area.  Spiritual beliefs and practices may help some smokers quit.  Download a "quit meter" on your computer to keep track of quit statistics, such as how long you have gone without smoking, cigarettes not smoked, and money saved.  Get a self-help book about quitting smoking and staying off tobacco. LEARN NEW SKILLS AND BEHAVIORS  Distract yourself from urges to smoke. Talk to someone, go for a walk, or occupy your time with a task.  Change your normal routine. Take a different route to work. Drink tea  instead of coffee. Eat breakfast in a different place.  Reduce your stress. Take a hot bath, exercise, or read a book.  Plan something enjoyable to do every day. Reward yourself for not smoking.  Explore interactive web-based programs that specialize in helping you quit. GET MEDICINE AND USE IT CORRECTLY Medicines can help you stop smoking and decrease the urge to smoke. Combining medicine with the above behavioral methods and support can greatly increase your chances of successfully quitting smoking.  Nicotine replacement therapy helps deliver nicotine to your body without the negative effects and risks of smoking. Nicotine replacement therapy includes nicotine gum, lozenges,   inhalers, nasal sprays, and skin patches. Some may be available over-the-counter and others require a prescription.  Antidepressant medicine helps people abstain from smoking, but how this works is unknown. This medicine is available by prescription.  Nicotinic receptor partial agonist medicine simulates the effect of nicotine in your brain. This medicine is available by prescription. Ask your health care provider for advice about which medicines to use and how to use them based on your health history. Your health care provider will tell you what side effects to look out for if you choose to be on a medicine or therapy. Carefully read the information on the package. Do not use any other product containing nicotine while using a nicotine replacement product.  RELAPSE OR DIFFICULT SITUATIONS Most relapses occur within the first 3 months after quitting. Do not be discouraged if you start smoking again. Remember, most people try several times before finally quitting. You may have symptoms of withdrawal because your body is used to nicotine. You may crave cigarettes, be irritable, feel very hungry, cough often, get headaches, or have difficulty concentrating. The withdrawal symptoms are only temporary. They are strongest when you  first quit, but they will go away within 10-14 days. To reduce the chances of relapse, try to:  Avoid drinking alcohol. Drinking lowers your chances of successfully quitting.  Reduce the amount of caffeine you consume. Once you quit smoking, the amount of caffeine in your body increases and can give you symptoms, such as a rapid heartbeat, sweating, and anxiety.  Avoid smokers because they can make you want to smoke.  Do not let weight gain distract you. Many smokers will gain weight when they quit, usually less than 10 pounds. Eat a healthy diet and stay active. You can always lose the weight gained after you quit.  Find ways to improve your mood other than smoking. FOR MORE INFORMATION  www.smokefree.gov  Document Released: 09/09/2001 Document Revised: 01/30/2014 Document Reviewed: 12/25/2011 Exit[employer] Patient Information 2015 [employer]. This information is not intended to replace advice given to you by your health care provider. Make sure you discuss any questions you have with your health care provider.    Smoking Cessation, Tips for Success If you are ready to quit smoking, congratulations! You have chosen to help yourself be healthier. Cigarettes bring nicotine, tar, carbon monoxide, and other irritants into your body. Your lungs, heart, and blood vessels will be able to work better without these poisons. There are many different ways to quit smoking. Nicotine gum, nicotine patches, a nicotine inhaler, or nicotine nasal spray can help with physical craving. Hypnosis, support groups, and medicines help break the habit of smoking. WHAT THINGS CAN I DO TO MAKE QUITTING EASIER?  Here are some tips to help you quit for good:  Pick a date when you will quit smoking completely. Tell all of your friends and family about your plan to quit on that date.  Do not try to slowly cut down on the number of cigarettes you are smoking. Pick a quit date and quit smoking completely starting on that  day.  Throw away all cigarettes.   Clean and remove all ashtrays from your home, work, and car.  On a card, write down your reasons for quitting. Carry the card with you and read it when you get the urge to smoke.  Cleanse your body of nicotine. Drink enough water and fluids to keep your urine clear or pale yellow. Do this after quitting to flush the nicotine from   your body.  Learn to predict your moods. Do not let a bad situation be your excuse to have a cigarette. Some situations in your life might tempt you into wanting a cigarette.  Never have "just one" cigarette. It leads to wanting another and another. Remind yourself of your decision to quit.  Change habits associated with smoking. If you smoked while driving or when feeling stressed, try other activities to replace smoking. Stand up when drinking your coffee. Brush your teeth after eating. Sit in a different chair when you read the paper. Avoid alcohol while trying to quit, and try to drink fewer caffeinated beverages. Alcohol and caffeine may urge you to smoke.  Avoid foods and drinks that can trigger a desire to smoke, such as sugary or spicy foods and alcohol.  Ask people who smoke not to smoke around you.  Have something planned to do right after eating or having a cup of coffee. For example, plan to take a walk or exercise.  Try a relaxation exercise to calm you down and decrease your stress. Remember, you may be tense and nervous for the first 2 weeks after you quit, but this will pass.  Find new activities to keep your hands busy. Play with a pen, coin, or rubber band. Doodle or draw things on paper.  Brush your teeth right after eating. This will help cut down on the craving for the taste of tobacco after meals. You can also try mouthwash.   Use oral substitutes in place of cigarettes. Try using lemon drops, carrots, cinnamon sticks, or chewing gum. Keep them handy so they are available when you have the urge to  smoke.  When you have the urge to smoke, try deep breathing.  Designate your home as a nonsmoking area.  If you are a heavy smoker, ask your health care provider about a prescription for nicotine chewing gum. It can ease your withdrawal from nicotine.  Reward yourself. Set aside the cigarette money you save and buy yourself something nice.  Look for support from others. Join a support group or smoking cessation program. Ask someone at home or at work to help you with your plan to quit smoking.  Always ask yourself, "Do I need this cigarette or is this just a reflex?" Tell yourself, "Today, I choose not to smoke," or "I do not want to smoke." You are reminding yourself of your decision to quit.  Do not replace cigarette smoking with electronic cigarettes (commonly called e-cigarettes). The safety of e-cigarettes is unknown, and some may contain harmful chemicals.  If you relapse, do not give up! Plan ahead and think about what you will do the next time you get the urge to smoke. HOW WILL I FEEL WHEN I QUIT SMOKING? You may have symptoms of withdrawal because your body is used to nicotine (the addictive substance in cigarettes). You may crave cigarettes, be irritable, feel very hungry, cough often, get headaches, or have difficulty concentrating. The withdrawal symptoms are only temporary. They are strongest when you first quit but will go away within 10-14 days. When withdrawal symptoms occur, stay in control. Think about your reasons for quitting. Remind yourself that these are signs that your body is healing and getting used to being without cigarettes. Remember that withdrawal symptoms are easier to treat than the major diseases that smoking can cause.  Even after the withdrawal is over, expect periodic urges to smoke. However, these cravings are generally short lived and will go away whether you   smoke or not. Do not smoke! WHAT RESOURCES ARE AVAILABLE TO HELP ME QUIT SMOKING? Your health care  provider can direct you to community resources or hospitals for support, which may include:  Group support.  Education.  Hypnosis.  Therapy. Document Released: 06/13/2004 Document Revised: 01/30/2014 Document Reviewed: 03/03/2013 ExitCare Patient Information 2015 [employer]. This information is not intended to replace advice given to you by your health care provider. Make sure you discuss any questions you have with your health care provider.  

## 2014-09-08 NOTE — Addendum Note (Signed)
Addended by: Dorthula Rue L on: 09/08/2014 11:38 AM   Modules accepted: Orders

## 2014-09-18 ENCOUNTER — Encounter: Payer: Self-pay | Admitting: Podiatry

## 2014-09-18 ENCOUNTER — Ambulatory Visit (INDEPENDENT_AMBULATORY_CARE_PROVIDER_SITE_OTHER): Payer: Medicaid Other | Admitting: Podiatry

## 2014-09-18 DIAGNOSIS — L84 Corns and callosities: Secondary | ICD-10-CM

## 2014-09-18 DIAGNOSIS — E1142 Type 2 diabetes mellitus with diabetic polyneuropathy: Secondary | ICD-10-CM

## 2014-09-18 DIAGNOSIS — G629 Polyneuropathy, unspecified: Secondary | ICD-10-CM

## 2014-09-18 NOTE — Patient Instructions (Signed)
Diabetes and Foot Care Diabetes may cause you to have problems because of poor blood supply (circulation) to your feet and legs. This may cause the skin on your feet to become thinner, break easier, and heal more slowly. Your skin may become dry, and the skin may peel and crack. You may also have nerve damage in your legs and feet causing decreased feeling in them. You may not notice minor injuries to your feet that could lead to infections or more serious problems. Taking care of your feet is one of the most important things you can do for yourself.  HOME CARE INSTRUCTIONS  Wear shoes at all times, even in the house. Do not go barefoot. Bare feet are easily injured.  Check your feet daily for blisters, cuts, and redness. If you cannot see the bottom of your feet, use a mirror or ask someone for help.  Wash your feet with warm water (do not use hot water) and mild soap. Then pat your feet and the areas between your toes until they are completely dry. Do not soak your feet as this can dry your skin.  Apply a moisturizing lotion or petroleum jelly (that does not contain alcohol and is unscented) to the skin on your feet and to dry, brittle toenails. Do not apply lotion between your toes.  Trim your toenails straight across. Do not dig under them or around the cuticle. File the edges of your nails with an emery board or nail file.  Do not cut corns or calluses or try to remove them with medicine.  Wear clean socks or stockings every day. Make sure they are not too tight. Do not wear knee-high stockings since they may decrease blood flow to your legs.  Wear shoes that fit properly and have enough cushioning. To break in new shoes, wear them for just a few hours a day. This prevents you from injuring your feet. Always look in your shoes before you put them on to be sure there are no objects inside.  Do not cross your legs. This may decrease the blood flow to your feet.  If you find a minor scrape,  cut, or break in the skin on your feet, keep it and the skin around it clean and dry. These areas may be cleansed with mild soap and water. Do not cleanse the area with peroxide, alcohol, or iodine.  When you remove an adhesive bandage, be sure not to damage the skin around it.  If you have a wound, look at it several times a day to make sure it is healing.  Do not use heating pads or hot water bottles. They may burn your skin. If you have lost feeling in your feet or legs, you may not know it is happening until it is too late.  Make sure your health care provider performs a complete foot exam at least annually or more often if you have foot problems. Report any cuts, sores, or bruises to your health care provider immediately. SEEK MEDICAL CARE IF:   You have an injury that is not healing.  You have cuts or breaks in the skin.  You have an ingrown nail.  You notice redness on your legs or feet.  You feel burning or tingling in your legs or feet.  You have pain or cramps in your legs and feet.  Your legs or feet are numb.  Your feet always feel cold. SEEK IMMEDIATE MEDICAL CARE IF:   There is increasing redness,   swelling, or pain in or around a wound.  There is a red line that goes up your leg.  Pus is coming from a wound.  You develop a fever or as directed by your health care provider.  You notice a bad smell coming from an ulcer or wound. Document Released: 09/12/2000 Document Revised: 05/18/2013 Document Reviewed: 02/22/2013 ExitCare Patient Information 2015 [employer]. This information is not intended to replace advice given to you by your health care provider. Make sure you discuss any questions you have with your health care provider.  

## 2014-09-18 NOTE — Progress Notes (Signed)
Patient ID: [name] [name], female   DOB: 11/22/1950, 63 y.o.   MRN: [id number] Subjective: This patient presents for ongoing debridement of pre-ulcerative in ulcerative skin lesion plantar right foot and debridement of mycotic toenails  Objective: Plantar fifth right MPJ calluses small amount of hemorrhagic debris within it. The callus remains closed after debridement Distal keratoses right hallux Toenails 1 through 5 right are elongated, hypertrophic, discolored 6 BK amputation left  Assessment: Pre-ulcerative callus sub-fifth MPJ right Onychomycoses one through 5 right History of diabetic peripheral neuropathy History of peripheral arterial disease associated with diabetes and smoking  Plan: Debrided toenails 1 through 5 right Debrided pre-ulcerative plantar keratoses 1 right  Patient will continue wearing diabetic shoe with pocket

## 2014-10-09 ENCOUNTER — Encounter: Payer: Self-pay | Admitting: Podiatry

## 2014-10-09 ENCOUNTER — Ambulatory Visit (INDEPENDENT_AMBULATORY_CARE_PROVIDER_SITE_OTHER): Payer: Medicaid Other | Admitting: Podiatry

## 2014-10-09 DIAGNOSIS — L84 Corns and callosities: Secondary | ICD-10-CM

## 2014-10-09 DIAGNOSIS — E1142 Type 2 diabetes mellitus with diabetic polyneuropathy: Secondary | ICD-10-CM

## 2014-10-09 DIAGNOSIS — Q828 Other specified congenital malformations of skin: Secondary | ICD-10-CM | POA: Diagnosis not present

## 2014-10-09 NOTE — Patient Instructions (Signed)
Diabetes and Foot Care Diabetes may cause you to have problems because of poor blood supply (circulation) to your feet and legs. This may cause the skin on your feet to become thinner, break easier, and heal more slowly. Your skin may become dry, and the skin may peel and crack. You may also have nerve damage in your legs and feet causing decreased feeling in them. You may not notice minor injuries to your feet that could lead to infections or more serious problems. Taking care of your feet is one of the most important things you can do for yourself.  HOME CARE INSTRUCTIONS  Wear shoes at all times, even in the house. Do not go barefoot. Bare feet are easily injured.  Check your feet daily for blisters, cuts, and redness. If you cannot see the bottom of your feet, use a mirror or ask someone for help.  Wash your feet with warm water (do not use hot water) and mild soap. Then pat your feet and the areas between your toes until they are completely dry. Do not soak your feet as this can dry your skin.  Apply a moisturizing lotion or petroleum jelly (that does not contain alcohol and is unscented) to the skin on your feet and to dry, brittle toenails. Do not apply lotion between your toes.  Trim your toenails straight across. Do not dig under them or around the cuticle. File the edges of your nails with an emery board or nail file.  Do not cut corns or calluses or try to remove them with medicine.  Wear clean socks or stockings every day. Make sure they are not too tight. Do not wear knee-high stockings since they may decrease blood flow to your legs.  Wear shoes that fit properly and have enough cushioning. To break in new shoes, wear them for just a few hours a day. This prevents you from injuring your feet. Always look in your shoes before you put them on to be sure there are no objects inside.  Do not cross your legs. This may decrease the blood flow to your feet.  If you find a minor scrape,  cut, or break in the skin on your feet, keep it and the skin around it clean and dry. These areas may be cleansed with mild soap and water. Do not cleanse the area with peroxide, alcohol, or iodine.  When you remove an adhesive bandage, be sure not to damage the skin around it.  If you have a wound, look at it several times a day to make sure it is healing.  Do not use heating pads or hot water bottles. They may burn your skin. If you have lost feeling in your feet or legs, you may not know it is happening until it is too late.  Make sure your health care provider performs a complete foot exam at least annually or more often if you have foot problems. Report any cuts, sores, or bruises to your health care provider immediately. SEEK MEDICAL CARE IF:   You have an injury that is not healing.  You have cuts or breaks in the skin.  You have an ingrown nail.  You notice redness on your legs or feet.  You feel burning or tingling in your legs or feet.  You have pain or cramps in your legs and feet.  Your legs or feet are numb.  Your feet always feel cold. SEEK IMMEDIATE MEDICAL CARE IF:   There is increasing redness,   swelling, or pain in or around a wound.  There is a red line that goes up your leg.  Pus is coming from a wound.  You develop a fever or as directed by your health care provider.  You notice a bad smell coming from an ulcer or wound. Document Released: 09/12/2000 Document Revised: 05/18/2013 Document Reviewed: 02/22/2013 ExitCare Patient Information 2015 [employer]. This information is not intended to replace advice given to you by your health care provider. Make sure you discuss any questions you have with your health care provider.  

## 2014-10-09 NOTE — Progress Notes (Signed)
Patient ID: [name] [name], female   DOB: [birth year], 64 y.o.   MRN: [id number]  Subjective: This patient presents for ongoing debridement of pre-ulcerative in ulcerative plantar skin lesion on the right foot  Objective: BK amputation left Hemorrhagic plantar keratoses sub-fifth right MPJ Hammertoe deformities 1 through 5 right Distal keratoses left hallux without bleeding  Assessment: Pre-ulcerative plantar keratoses sub-fifth right MPJ History of diabetic peripheral neuropathy History of peripheral arterial disease  Plan: Debrided pre-ulcerative plantar keratoses 1 right foot  Patient will continue wearing diabetic shoe with pocket accommodation to offload fifth right MPJ  Reappoint 3 weeks

## 2014-11-01 ENCOUNTER — Encounter: Payer: Self-pay | Admitting: Podiatry

## 2014-11-01 ENCOUNTER — Ambulatory Visit (INDEPENDENT_AMBULATORY_CARE_PROVIDER_SITE_OTHER): Payer: Medicaid Other | Admitting: Podiatry

## 2014-11-01 DIAGNOSIS — Q828 Other specified congenital malformations of skin: Secondary | ICD-10-CM

## 2014-11-01 DIAGNOSIS — E1142 Type 2 diabetes mellitus with diabetic polyneuropathy: Secondary | ICD-10-CM | POA: Diagnosis not present

## 2014-11-01 DIAGNOSIS — L84 Corns and callosities: Secondary | ICD-10-CM

## 2014-11-01 DIAGNOSIS — G629 Polyneuropathy, unspecified: Secondary | ICD-10-CM

## 2014-11-01 NOTE — Progress Notes (Signed)
Patient ID: [name] [name], female   DOB: October 02, 1950, 64 y.o.   MRN: [id number]  Subjective: This patient presents for ongoing debridement of pre-ulcerative keratoses on the right foot  Objective: BK amputation left Hemorrhagic plantar keratoses fifth right MPJ Keratoses distal right hallux  Assessment: Pre-ulcerative plantar keratoses sub-fifth right MPJ History of diabetic peripheral neuropathy History of peripheral arterial disease  Plan: Debridement of pre-ulcerative and keratoses 2 right foot  Patient will continue wearing diabetic shoe on the right foot with pocket accommodation offload the fifth right MPJ  Reappoint 3 months

## 2014-11-01 NOTE — Patient Instructions (Signed)
Diabetes and Foot Care Diabetes may cause you to have problems because of poor blood supply (circulation) to your feet and legs. This may cause the skin on your feet to become thinner, break easier, and heal more slowly. Your skin may become dry, and the skin may peel and crack. You may also have nerve damage in your legs and feet causing decreased feeling in them. You may not notice minor injuries to your feet that could lead to infections or more serious problems. Taking care of your feet is one of the most important things you can do for yourself.  HOME CARE INSTRUCTIONS  Wear shoes at all times, even in the house. Do not go barefoot. Bare feet are easily injured.  Check your feet daily for blisters, cuts, and redness. If you cannot see the bottom of your feet, use a mirror or ask someone for help.  Wash your feet with warm water (do not use hot water) and mild soap. Then pat your feet and the areas between your toes until they are completely dry. Do not soak your feet as this can dry your skin.  Apply a moisturizing lotion or petroleum jelly (that does not contain alcohol and is unscented) to the skin on your feet and to dry, brittle toenails. Do not apply lotion between your toes.  Trim your toenails straight across. Do not dig under them or around the cuticle. File the edges of your nails with an emery board or nail file.  Do not cut corns or calluses or try to remove them with medicine.  Wear clean socks or stockings every day. Make sure they are not too tight. Do not wear knee-high stockings since they may decrease blood flow to your legs.  Wear shoes that fit properly and have enough cushioning. To break in new shoes, wear them for just a few hours a day. This prevents you from injuring your feet. Always look in your shoes before you put them on to be sure there are no objects inside.  Do not cross your legs. This may decrease the blood flow to your feet.  If you find a minor scrape,  cut, or break in the skin on your feet, keep it and the skin around it clean and dry. These areas may be cleansed with mild soap and water. Do not cleanse the area with peroxide, alcohol, or iodine.  When you remove an adhesive bandage, be sure not to damage the skin around it.  If you have a wound, look at it several times a day to make sure it is healing.  Do not use heating pads or hot water bottles. They may burn your skin. If you have lost feeling in your feet or legs, you may not know it is happening until it is too late.  Make sure your health care provider performs a complete foot exam at least annually or more often if you have foot problems. Report any cuts, sores, or bruises to your health care provider immediately. SEEK MEDICAL CARE IF:   You have an injury that is not healing.  You have cuts or breaks in the skin.  You have an ingrown nail.  You notice redness on your legs or feet.  You feel burning or tingling in your legs or feet.  You have pain or cramps in your legs and feet.  Your legs or feet are numb.  Your feet always feel cold. SEEK IMMEDIATE MEDICAL CARE IF:   There is increasing redness,   swelling, or pain in or around a wound.  There is a red line that goes up your leg.  Pus is coming from a wound.  You develop a fever or as directed by your health care provider.  You notice a bad smell coming from an ulcer or wound. Document Released: 09/12/2000 Document Revised: 05/18/2013 Document Reviewed: 02/22/2013 ExitCare Patient Information 2015 [employer]. This information is not intended to replace advice given to you by your health care provider. Make sure you discuss any questions you have with your health care provider.  

## 2014-11-02 ENCOUNTER — Inpatient Hospital Stay (HOSPITAL_COMMUNITY): Payer: Medicaid Other

## 2014-11-02 ENCOUNTER — Inpatient Hospital Stay (HOSPITAL_COMMUNITY)
Admission: EM | Admit: 2014-11-02 | Discharge: 2014-11-08 | DRG: 292 | Disposition: A | Discharge status: Home / Self Care | Payer: Medicaid Other | Attending: Internal Medicine | Admitting: Internal Medicine

## 2014-11-02 ENCOUNTER — Encounter (HOSPITAL_COMMUNITY): Payer: Self-pay | Admitting: Emergency Medicine

## 2014-11-02 ENCOUNTER — Emergency Department (HOSPITAL_COMMUNITY): Payer: Medicaid Other

## 2014-11-02 DIAGNOSIS — Z8674 Personal history of sudden cardiac arrest: Secondary | ICD-10-CM

## 2014-11-02 DIAGNOSIS — J9620 Acute and chronic respiratory failure, unspecified whether with hypoxia or hypercapnia: Secondary | ICD-10-CM

## 2014-11-02 DIAGNOSIS — N2581 Secondary hyperparathyroidism of renal origin: Secondary | ICD-10-CM | POA: Diagnosis present

## 2014-11-02 DIAGNOSIS — J811 Chronic pulmonary edema: Secondary | ICD-10-CM | POA: Diagnosis present

## 2014-11-02 DIAGNOSIS — F17200 Nicotine dependence, unspecified, uncomplicated: Secondary | ICD-10-CM | POA: Diagnosis present

## 2014-11-02 DIAGNOSIS — M25512 Pain in left shoulder: Secondary | ICD-10-CM | POA: Diagnosis not present

## 2014-11-02 DIAGNOSIS — Z8249 Family history of ischemic heart disease and other diseases of the circulatory system: Secondary | ICD-10-CM

## 2014-11-02 DIAGNOSIS — I2510 Atherosclerotic heart disease of native coronary artery without angina pectoris: Secondary | ICD-10-CM | POA: Diagnosis present

## 2014-11-02 DIAGNOSIS — H409 Unspecified glaucoma: Secondary | ICD-10-CM

## 2014-11-02 DIAGNOSIS — Z7951 Long term (current) use of inhaled steroids: Secondary | ICD-10-CM

## 2014-11-02 DIAGNOSIS — R061 Stridor: Secondary | ICD-10-CM

## 2014-11-02 DIAGNOSIS — J90 Pleural effusion, not elsewhere classified: Secondary | ICD-10-CM | POA: Diagnosis present

## 2014-11-02 DIAGNOSIS — Z22322 Carrier or suspected carrier of Methicillin resistant Staphylococcus aureus: Secondary | ICD-10-CM

## 2014-11-02 DIAGNOSIS — E11649 Type 2 diabetes mellitus with hypoglycemia without coma: Secondary | ICD-10-CM | POA: Diagnosis not present

## 2014-11-02 DIAGNOSIS — I270 Primary pulmonary hypertension: Secondary | ICD-10-CM

## 2014-11-02 DIAGNOSIS — I272 Other secondary pulmonary hypertension: Secondary | ICD-10-CM | POA: Diagnosis present

## 2014-11-02 DIAGNOSIS — I429 Cardiomyopathy, unspecified: Secondary | ICD-10-CM

## 2014-11-02 DIAGNOSIS — R0602 Shortness of breath: Secondary | ICD-10-CM

## 2014-11-02 DIAGNOSIS — N184 Chronic kidney disease, stage 4 (severe): Secondary | ICD-10-CM | POA: Diagnosis present

## 2014-11-02 DIAGNOSIS — R4701 Aphasia: Secondary | ICD-10-CM

## 2014-11-02 DIAGNOSIS — I739 Peripheral vascular disease, unspecified: Secondary | ICD-10-CM | POA: Diagnosis present

## 2014-11-02 DIAGNOSIS — G4700 Insomnia, unspecified: Secondary | ICD-10-CM | POA: Diagnosis present

## 2014-11-02 DIAGNOSIS — A0472 Enterocolitis due to Clostridium difficile, not specified as recurrent: Secondary | ICD-10-CM | POA: Diagnosis present

## 2014-11-02 DIAGNOSIS — I5043 Acute on chronic combined systolic (congestive) and diastolic (congestive) heart failure: Secondary | ICD-10-CM | POA: Diagnosis present

## 2014-11-02 DIAGNOSIS — Z9119 Patient's noncompliance with other medical treatment and regimen: Secondary | ICD-10-CM | POA: Diagnosis present

## 2014-11-02 DIAGNOSIS — R4182 Altered mental status, unspecified: Secondary | ICD-10-CM | POA: Diagnosis not present

## 2014-11-02 DIAGNOSIS — R0603 Acute respiratory distress: Secondary | ICD-10-CM

## 2014-11-02 DIAGNOSIS — Z833 Family history of diabetes mellitus: Secondary | ICD-10-CM

## 2014-11-02 DIAGNOSIS — E1121 Type 2 diabetes mellitus with diabetic nephropathy: Secondary | ICD-10-CM | POA: Diagnosis present

## 2014-11-02 DIAGNOSIS — Z9114 Patient's other noncompliance with medication regimen: Secondary | ICD-10-CM | POA: Diagnosis present

## 2014-11-02 DIAGNOSIS — A047 Enterocolitis due to Clostridium difficile: Secondary | ICD-10-CM | POA: Diagnosis present

## 2014-11-02 DIAGNOSIS — I10 Essential (primary) hypertension: Secondary | ICD-10-CM

## 2014-11-02 DIAGNOSIS — I129 Hypertensive chronic kidney disease with stage 1 through stage 4 chronic kidney disease, or unspecified chronic kidney disease: Secondary | ICD-10-CM | POA: Diagnosis present

## 2014-11-02 DIAGNOSIS — F17210 Nicotine dependence, cigarettes, uncomplicated: Secondary | ICD-10-CM | POA: Diagnosis present

## 2014-11-02 DIAGNOSIS — I425 Other restrictive cardiomyopathy: Secondary | ICD-10-CM | POA: Diagnosis present

## 2014-11-02 DIAGNOSIS — Z89422 Acquired absence of other left toe(s): Secondary | ICD-10-CM

## 2014-11-02 DIAGNOSIS — Z89512 Acquired absence of left leg below knee: Secondary | ICD-10-CM

## 2014-11-02 DIAGNOSIS — Z96642 Presence of left artificial hip joint: Secondary | ICD-10-CM | POA: Diagnosis present

## 2014-11-02 DIAGNOSIS — E1140 Type 2 diabetes mellitus with diabetic neuropathy, unspecified: Secondary | ICD-10-CM

## 2014-11-02 DIAGNOSIS — G894 Chronic pain syndrome: Secondary | ICD-10-CM

## 2014-11-02 DIAGNOSIS — E559 Vitamin D deficiency, unspecified: Secondary | ICD-10-CM

## 2014-11-02 DIAGNOSIS — D649 Anemia, unspecified: Secondary | ICD-10-CM | POA: Diagnosis present

## 2014-11-02 DIAGNOSIS — I501 Left ventricular failure: Secondary | ICD-10-CM | POA: Diagnosis present

## 2014-11-02 DIAGNOSIS — B351 Tinea unguium: Secondary | ICD-10-CM | POA: Diagnosis present

## 2014-11-02 DIAGNOSIS — T502X5A Adverse effect of carbonic-anhydrase inhibitors, benzothiadiazides and other diuretics, initial encounter: Secondary | ICD-10-CM | POA: Diagnosis not present

## 2014-11-02 DIAGNOSIS — R0600 Dyspnea, unspecified: Secondary | ICD-10-CM | POA: Diagnosis present

## 2014-11-02 DIAGNOSIS — I509 Heart failure, unspecified: Secondary | ICD-10-CM

## 2014-11-02 DIAGNOSIS — E1122 Type 2 diabetes mellitus with diabetic chronic kidney disease: Secondary | ICD-10-CM | POA: Diagnosis present

## 2014-11-02 DIAGNOSIS — Z950 Presence of cardiac pacemaker: Secondary | ICD-10-CM

## 2014-11-02 DIAGNOSIS — R0989 Other specified symptoms and signs involving the circulatory and respiratory systems: Secondary | ICD-10-CM

## 2014-11-02 DIAGNOSIS — E785 Hyperlipidemia, unspecified: Secondary | ICD-10-CM | POA: Diagnosis present

## 2014-11-02 DIAGNOSIS — E1142 Type 2 diabetes mellitus with diabetic polyneuropathy: Secondary | ICD-10-CM | POA: Diagnosis present

## 2014-11-02 DIAGNOSIS — N179 Acute kidney failure, unspecified: Secondary | ICD-10-CM | POA: Diagnosis present

## 2014-11-02 HISTORY — DX: Chronic kidney disease, stage 4 (severe): N18.4

## 2014-11-02 HISTORY — DX: Cardiomyopathy, unspecified: I42.9

## 2014-11-02 LAB — TROPONIN I: Troponin I: <0.03 ng/mL (ref <0.031)

## 2014-11-02 LAB — I-STAT TROPONIN, ED: Troponin i, poc: 0.02 ng/mL (ref 0.00–0.08)

## 2014-11-02 LAB — BASIC METABOLIC PANEL
Anion gap: 6 (ref 5–15)
BUN: 12 mg/dL (ref 6–23)
CO2: 23 mmol/L (ref 19–32)
Calcium: 9.1 mg/dL (ref 8.4–10.5)
Chloride: 112 mmol/L (ref 96–112)
Creatinine, Ser: 1.64 mg/dL — ABNORMAL HIGH (ref 0.50–1.10)
GFR, EST AFRICAN AMERICAN: 38 mL/min — AB (ref >90)
GFR, EST NON AFRICAN AMERICAN: 33 mL/min — AB (ref >90)
GLUCOSE: 97 mg/dL (ref 70–99)
Potassium: 4 mmol/L (ref 3.5–5.1)
SODIUM: 141 mmol/L (ref 135–145)

## 2014-11-02 LAB — CBC
HCT: 37.5 % (ref 36.0–46.0)
Hemoglobin: 12.7 g/dL (ref 12.0–15.0)
MCH: 29.7 pg (ref 26.0–34.0)
MCHC: 33.9 g/dL (ref 30.0–36.0)
MCV: 87.8 fL (ref 78.0–100.0)
Platelets: 165 10*3/uL (ref 150–400)
RBC: 4.27 MIL/uL (ref 3.87–5.11)
RDW: 13.9 % (ref 11.5–15.5)
WBC: 4.2 10*3/uL (ref 4.0–10.5)

## 2014-11-02 LAB — GLUCOSE, CAPILLARY
GLUCOSE-CAPILLARY: 236 mg/dL — AB (ref 70–99)
Glucose-Capillary: 252 mg/dL — ABNORMAL HIGH (ref 70–99)

## 2014-11-02 LAB — BRAIN NATRIURETIC PEPTIDE: B Natriuretic Peptide: >4500 pg/mL — ABNORMAL HIGH (ref 0.0–100.0)

## 2014-11-02 LAB — TSH: TSH: 0.612 u[IU]/mL (ref 0.350–4.500)

## 2014-11-02 LAB — MRSA PCR SCREENING: MRSA by PCR: POSITIVE — AB

## 2014-11-02 LAB — PROCALCITONIN: Procalcitonin: 0.1 ng/mL

## 2014-11-02 MED ORDER — ONDANSETRON HCL 4 MG/2ML IJ SOLN
4.0000 mg | Freq: Four times a day (QID) | INTRAMUSCULAR | Status: DC | PRN
  Administered 2014-11-03: 4 mg via INTRAVENOUS
  Filled 2014-11-02: qty 2

## 2014-11-02 MED ORDER — PREDNISOLONE ACETATE 1 % OP SUSP
1.0000 [drp] | Freq: Four times a day (QID) | OPHTHALMIC | Status: DC
  Administered 2014-11-02 – 2014-11-08 (×22): 1 [drp] via OPHTHALMIC
  Filled 2014-11-02: qty 1

## 2014-11-02 MED ORDER — SODIUM CHLORIDE 0.9 % IJ SOLN
3.0000 mL | Freq: Two times a day (BID) | INTRAMUSCULAR | Status: DC
  Administered 2014-11-02 – 2014-11-07 (×9): 3 mL via INTRAVENOUS

## 2014-11-02 MED ORDER — INSULIN ASPART 100 UNIT/ML ~~LOC~~ SOLN
0.0000 [IU] | Freq: Three times a day (TID) | SUBCUTANEOUS | Status: DC
Start: 2014-11-03 — End: 2014-11-04
  Administered 2014-11-03 (×2): 1 [IU] via SUBCUTANEOUS

## 2014-11-02 MED ORDER — SODIUM CHLORIDE 0.9 % IV BOLUS (SEPSIS)
500.0000 mL | Freq: Once | INTRAVENOUS | Status: AC
  Administered 2014-11-02: 500 mL via INTRAVENOUS

## 2014-11-02 MED ORDER — CHLORHEXIDINE GLUCONATE CLOTH 2 % EX PADS
6.0000 | MEDICATED_PAD | Freq: Every day | CUTANEOUS | Status: AC
  Administered 2014-11-03 – 2014-11-07 (×4): 6 via TOPICAL

## 2014-11-02 MED ORDER — CHLORHEXIDINE GLUCONATE 0.12 % MT SOLN
15.0000 mL | Freq: Two times a day (BID) | OROMUCOSAL | Status: DC
  Administered 2014-11-03 – 2014-11-08 (×11): 15 mL via OROMUCOSAL
  Filled 2014-11-02 (×14): qty 15

## 2014-11-02 MED ORDER — FUROSEMIDE 40 MG PO TABS
40.0000 mg | ORAL_TABLET | Freq: Every day | ORAL | Status: DC
  Administered 2014-11-03: 40 mg via ORAL
  Filled 2014-11-02: qty 1

## 2014-11-02 MED ORDER — CARVEDILOL 12.5 MG PO TABS
12.5000 mg | ORAL_TABLET | Freq: Two times a day (BID) | ORAL | Status: DC
  Administered 2014-11-02 – 2014-11-06 (×8): 12.5 mg via ORAL
  Filled 2014-11-02 (×10): qty 1

## 2014-11-02 MED ORDER — INSULIN ASPART 100 UNIT/ML ~~LOC~~ SOLN
0.0000 [IU] | Freq: Every day | SUBCUTANEOUS | Status: DC
Start: 2014-11-02 — End: 2014-11-04
  Administered 2014-11-02: 2 [IU] via SUBCUTANEOUS

## 2014-11-02 MED ORDER — SODIUM CHLORIDE 0.9 % IJ SOLN
3.0000 mL | INTRAMUSCULAR | Status: DC | PRN
  Administered 2014-11-03: 3 mL via INTRAVENOUS
  Filled 2014-11-02: qty 3

## 2014-11-02 MED ORDER — FUROSEMIDE 10 MG/ML IJ SOLN
60.0000 mg | Freq: Once | INTRAMUSCULAR | Status: AC
  Administered 2014-11-02: 60 mg via INTRAVENOUS
  Filled 2014-11-02: qty 6

## 2014-11-02 MED ORDER — ONDANSETRON HCL 4 MG PO TABS: 4.0000 mg | ORAL_TABLET | Freq: Four times a day (QID) | ORAL | Status: DC | PRN

## 2014-11-02 MED ORDER — LATANOPROST 0.005 % OP SOLN
1.0000 [drp] | Freq: Every day | OPHTHALMIC | Status: DC
  Administered 2014-11-02 – 2014-11-07 (×6): 1 [drp] via OPHTHALMIC
  Filled 2014-11-02: qty 2.5

## 2014-11-02 MED ORDER — ONDANSETRON HCL 4 MG/2ML IJ SOLN: 4.0000 mg | Freq: Three times a day (TID) | INTRAMUSCULAR | Status: DC | PRN

## 2014-11-02 MED ORDER — ALBUTEROL SULFATE (2.5 MG/3ML) 0.083% IN NEBU
5.0000 mg | INHALATION_SOLUTION | Freq: Once | RESPIRATORY_TRACT | Status: AC
  Administered 2014-11-02: 5 mg via RESPIRATORY_TRACT
  Filled 2014-11-02: qty 6

## 2014-11-02 MED ORDER — CETYLPYRIDINIUM CHLORIDE 0.05 % MT LIQD
7.0000 mL | Freq: Two times a day (BID) | OROMUCOSAL | Status: DC
  Administered 2014-11-03 – 2014-11-08 (×11): 7 mL via OROMUCOSAL

## 2014-11-02 MED ORDER — SODIUM CHLORIDE 0.9 % IV SOLN: INTRAVENOUS | Status: DC

## 2014-11-02 MED ORDER — METHYLPREDNISOLONE SODIUM SUCC 125 MG IJ SOLR
125.0000 mg | Freq: Once | INTRAMUSCULAR | Status: AC
  Administered 2014-11-02: 125 mg via INTRAVENOUS
  Filled 2014-11-02: qty 2

## 2014-11-02 MED ORDER — SODIUM CHLORIDE 0.9 % IJ SOLN
3.0000 mL | Freq: Two times a day (BID) | INTRAMUSCULAR | Status: DC
Start: 2014-11-02 — End: 2014-11-08
  Administered 2014-11-04 – 2014-11-08 (×5): 3 mL via INTRAVENOUS

## 2014-11-02 MED ORDER — HEPARIN SODIUM (PORCINE) 5000 UNIT/ML IJ SOLN
5000.0000 [IU] | Freq: Three times a day (TID) | INTRAMUSCULAR | Status: DC
  Administered 2014-11-02 – 2014-11-08 (×17): 5000 [IU] via SUBCUTANEOUS
  Filled 2014-11-02 (×19): qty 1

## 2014-11-02 MED ORDER — GABAPENTIN 400 MG PO CAPS
400.0000 mg | ORAL_CAPSULE | Freq: Three times a day (TID) | ORAL | Status: DC
  Administered 2014-11-02 – 2014-11-08 (×17): 400 mg via ORAL
  Filled 2014-11-02 (×19): qty 1

## 2014-11-02 MED ORDER — IPRATROPIUM-ALBUTEROL 0.5-2.5 (3) MG/3ML IN SOLN
3.0000 mL | Freq: Once | RESPIRATORY_TRACT | Status: AC
  Administered 2014-11-02: 3 mL via RESPIRATORY_TRACT
  Filled 2014-11-02: qty 3

## 2014-11-02 MED ORDER — ISOSORBIDE MONONITRATE ER 60 MG PO TB24
60.0000 mg | ORAL_TABLET | Freq: Every day | ORAL | Status: DC
  Administered 2014-11-02 – 2014-11-08 (×6): 60 mg via ORAL
  Filled 2014-11-02 (×7): qty 1

## 2014-11-02 MED ORDER — IBUPROFEN 600 MG PO TABS
600.0000 mg | ORAL_TABLET | Freq: Two times a day (BID) | ORAL | Status: DC | PRN
  Filled 2014-11-02: qty 1

## 2014-11-02 MED ORDER — MUPIROCIN 2 % EX OINT
1.0000 "application " | TOPICAL_OINTMENT | Freq: Two times a day (BID) | CUTANEOUS | Status: AC
  Administered 2014-11-02 – 2014-11-06 (×9): 1 via NASAL
  Filled 2014-11-02: qty 22

## 2014-11-02 MED ORDER — INSULIN ASPART 100 UNIT/ML ~~LOC~~ SOLN: 0.0000 [IU] | Freq: Three times a day (TID) | SUBCUTANEOUS | Status: DC

## 2014-11-02 MED ORDER — SODIUM CHLORIDE 0.9 % IV SOLN: 250.0000 mL | INTRAVENOUS | Status: DC | PRN

## 2014-11-02 MED ORDER — ALBUTEROL SULFATE (2.5 MG/3ML) 0.083% IN NEBU
5.0000 mg | INHALATION_SOLUTION | RESPIRATORY_TRACT | Status: AC | PRN
  Administered 2014-11-02: 5 mg via RESPIRATORY_TRACT
  Filled 2014-11-02 (×2): qty 6

## 2014-11-02 NOTE — ED Notes (Signed)
Dialed phone number to [employer] [phone] for patient to speak to apartment complex.

## 2014-11-02 NOTE — Progress Notes (Signed)
EKG performed and in chart.  MD made aware that patient's CBGs elevated but currently no order for SSI.  SSI ordered by MD.  Patient made aware.  Will continue to monitor.

## 2014-11-02 NOTE — Progress Notes (Signed)
Patient's lung sounds with inspiratory and expiratory wheezing in lower and upper lobes.  Patient short of breath at rest and with minimal activity.  Patient's oxygen saturation 100% on 2L via nasal cannula.  PRN albuterol neb administered, lung sounds remain unchanged, patient states "it's easier to breathe now".  MD on call made aware, no additional orders at this time.  Will continue to monitor.

## 2014-11-02 NOTE — Progress Notes (Addendum)
Went to see patient as RN called saying she was in respiratory distress. Patient denied any chest pain but said has SOB. SOB improved somewhat with albuterol treatment. Has hx of cough x3 days, productive of copious green sputum + subjective fever at home. No sick contacts.   Received solumedrol in ed. Now on q2 albuterol. No fevers here.   PE: HEENT: [place]/at, oral mucosa clear, protecting airway, talking Resp: has upper airway stridor, has decreased air movement, no wheezing, course breath sounds. Card: RRR, no m/r/g. NO JVD. No friction rubs. Abd: soft, nt, nd Ext: no edema. Left BKA.   Filed Vitals:   11/02/14 2227  BP: 140/73  Pulse: 60  Temp: 98 F (36.7 C)  Resp: 20    A/P:  Resp distress: with subjective fever with increased greenish sputum production. Concerning for Pneumonia.EKG changes suggestive of antero-lateral ischemia. trops negative. BNP >4500. Appears dry on exam. Has zero urine output for last 8 hours despite lasix in ED. No friction rubs.  ddx for this super high BNP is broad including sepsis, PE, MI. I doubt this is volume overload.    - will get Resp culture. ABG. Will get Procalcitonin. CXR was poor quality, portable, showing interstitial edema/pleural effusion. Will repeat EKG after giving fluid in AM. - Repeat EKG and get trops 12 hours which should rule out AMI. No current chest pain.  AKI - no UOP last 8 hours even with lasix. Likely pre-renal, appears dry on exam -Will give her fluid 500cc bolus and monitor uop. Bladder scan. Getting FENA -   Hilar fullness on CXR - lymphoma, TB, pulm HTN, sarcoid, etc. Will repeat cxr.

## 2014-11-02 NOTE — ED Notes (Signed)
Pt given graham crackers, peanut butter, and apple juice.  

## 2014-11-02 NOTE — ED Notes (Signed)
Per EMS: sob and nausea/vomiting x 3 days, progressively worsened.  Called 911, upon FD arrival pt 91% ra, given NRB, pt stayed 99-100% when NRB was decreased from 15L/min to 10/min by ems.  Currently room air spo2 is 100% at this facility.

## 2014-11-02 NOTE — ED Notes (Signed)
MD at bedside. Internal Medicine Consult.
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
## 2014-11-03 ENCOUNTER — Observation Stay (HOSPITAL_COMMUNITY): Payer: Medicaid Other

## 2014-11-03 ENCOUNTER — Encounter (HOSPITAL_COMMUNITY): Payer: Self-pay | Admitting: General Practice

## 2014-11-03 DIAGNOSIS — I129 Hypertensive chronic kidney disease with stage 1 through stage 4 chronic kidney disease, or unspecified chronic kidney disease: Secondary | ICD-10-CM

## 2014-11-03 DIAGNOSIS — G4700 Insomnia, unspecified: Secondary | ICD-10-CM

## 2014-11-03 DIAGNOSIS — Z89422 Acquired absence of other left toe(s): Secondary | ICD-10-CM | POA: Diagnosis not present

## 2014-11-03 DIAGNOSIS — R4182 Altered mental status, unspecified: Secondary | ICD-10-CM | POA: Diagnosis not present

## 2014-11-03 DIAGNOSIS — Z833 Family history of diabetes mellitus: Secondary | ICD-10-CM | POA: Diagnosis not present

## 2014-11-03 DIAGNOSIS — Z22322 Carrier or suspected carrier of Methicillin resistant Staphylococcus aureus: Secondary | ICD-10-CM | POA: Diagnosis not present

## 2014-11-03 DIAGNOSIS — I2510 Atherosclerotic heart disease of native coronary artery without angina pectoris: Secondary | ICD-10-CM

## 2014-11-03 DIAGNOSIS — Z7951 Long term (current) use of inhaled steroids: Secondary | ICD-10-CM | POA: Diagnosis not present

## 2014-11-03 DIAGNOSIS — G459 Transient cerebral ischemic attack, unspecified: Secondary | ICD-10-CM | POA: Diagnosis not present

## 2014-11-03 DIAGNOSIS — Z950 Presence of cardiac pacemaker: Secondary | ICD-10-CM | POA: Diagnosis not present

## 2014-11-03 DIAGNOSIS — I425 Other restrictive cardiomyopathy: Secondary | ICD-10-CM | POA: Diagnosis present

## 2014-11-03 DIAGNOSIS — E1121 Type 2 diabetes mellitus with diabetic nephropathy: Secondary | ICD-10-CM | POA: Diagnosis present

## 2014-11-03 DIAGNOSIS — G894 Chronic pain syndrome: Secondary | ICD-10-CM | POA: Diagnosis present

## 2014-11-03 DIAGNOSIS — N184 Chronic kidney disease, stage 4 (severe): Secondary | ICD-10-CM | POA: Diagnosis present

## 2014-11-03 DIAGNOSIS — Z89512 Acquired absence of left leg below knee: Secondary | ICD-10-CM | POA: Diagnosis not present

## 2014-11-03 DIAGNOSIS — E785 Hyperlipidemia, unspecified: Secondary | ICD-10-CM | POA: Diagnosis present

## 2014-11-03 DIAGNOSIS — R061 Stridor: Secondary | ICD-10-CM | POA: Diagnosis not present

## 2014-11-03 DIAGNOSIS — N2581 Secondary hyperparathyroidism of renal origin: Secondary | ICD-10-CM | POA: Diagnosis present

## 2014-11-03 DIAGNOSIS — B351 Tinea unguium: Secondary | ICD-10-CM | POA: Diagnosis present

## 2014-11-03 DIAGNOSIS — H409 Unspecified glaucoma: Secondary | ICD-10-CM | POA: Diagnosis present

## 2014-11-03 DIAGNOSIS — I5043 Acute on chronic combined systolic (congestive) and diastolic (congestive) heart failure: Secondary | ICD-10-CM | POA: Diagnosis present

## 2014-11-03 DIAGNOSIS — N179 Acute kidney failure, unspecified: Secondary | ICD-10-CM | POA: Diagnosis present

## 2014-11-03 DIAGNOSIS — I739 Peripheral vascular disease, unspecified: Secondary | ICD-10-CM | POA: Diagnosis present

## 2014-11-03 DIAGNOSIS — I501 Left ventricular failure: Secondary | ICD-10-CM | POA: Diagnosis present

## 2014-11-03 DIAGNOSIS — Z8674 Personal history of sudden cardiac arrest: Secondary | ICD-10-CM | POA: Diagnosis not present

## 2014-11-03 DIAGNOSIS — T502X5A Adverse effect of carbonic-anhydrase inhibitors, benzothiadiazides and other diuretics, initial encounter: Secondary | ICD-10-CM | POA: Diagnosis not present

## 2014-11-03 DIAGNOSIS — E1142 Type 2 diabetes mellitus with diabetic polyneuropathy: Secondary | ICD-10-CM | POA: Diagnosis present

## 2014-11-03 DIAGNOSIS — E11649 Type 2 diabetes mellitus with hypoglycemia without coma: Secondary | ICD-10-CM | POA: Diagnosis not present

## 2014-11-03 DIAGNOSIS — A047 Enterocolitis due to Clostridium difficile: Secondary | ICD-10-CM | POA: Diagnosis present

## 2014-11-03 DIAGNOSIS — Z8249 Family history of ischemic heart disease and other diseases of the circulatory system: Secondary | ICD-10-CM | POA: Diagnosis not present

## 2014-11-03 DIAGNOSIS — E1122 Type 2 diabetes mellitus with diabetic chronic kidney disease: Secondary | ICD-10-CM | POA: Diagnosis present

## 2014-11-03 DIAGNOSIS — R600 Localized edema: Secondary | ICD-10-CM

## 2014-11-03 DIAGNOSIS — D649 Anemia, unspecified: Secondary | ICD-10-CM | POA: Diagnosis present

## 2014-11-03 DIAGNOSIS — I272 Other secondary pulmonary hypertension: Secondary | ICD-10-CM | POA: Diagnosis present

## 2014-11-03 DIAGNOSIS — Z9114 Patient's other noncompliance with medication regimen: Secondary | ICD-10-CM | POA: Diagnosis present

## 2014-11-03 DIAGNOSIS — Z9119 Patient's noncompliance with other medical treatment and regimen: Secondary | ICD-10-CM | POA: Diagnosis present

## 2014-11-03 DIAGNOSIS — F17210 Nicotine dependence, cigarettes, uncomplicated: Secondary | ICD-10-CM | POA: Diagnosis present

## 2014-11-03 DIAGNOSIS — Z96642 Presence of left artificial hip joint: Secondary | ICD-10-CM | POA: Diagnosis present

## 2014-11-03 DIAGNOSIS — E119 Type 2 diabetes mellitus without complications: Secondary | ICD-10-CM

## 2014-11-03 DIAGNOSIS — N183 Chronic kidney disease, stage 3 (moderate): Secondary | ICD-10-CM

## 2014-11-03 DIAGNOSIS — I10 Essential (primary) hypertension: Secondary | ICD-10-CM | POA: Diagnosis present

## 2014-11-03 DIAGNOSIS — M25512 Pain in left shoulder: Secondary | ICD-10-CM | POA: Diagnosis not present

## 2014-11-03 LAB — BLOOD GAS, ARTERIAL
Acid-base deficit: 3.8 mmol/L — ABNORMAL HIGH (ref 0.0–2.0)
Bicarbonate: 19.8 mEq/L — ABNORMAL LOW (ref 20.0–24.0)
DRAWN BY: 39866
O2 CONTENT: 2 L/min
O2 Saturation: 95.2 %
PH ART: 7.43 (ref 7.350–7.450)
PO2 ART: 77.5 mmHg — AB (ref 80.0–100.0)
Patient temperature: 98.6
TCO2: 20.7 mmol/L (ref 0–100)
pCO2 arterial: 30.3 mmHg — ABNORMAL LOW (ref 35.0–45.0)

## 2014-11-03 LAB — URINALYSIS, ROUTINE W REFLEX MICROSCOPIC
Bilirubin Urine: NEGATIVE
GLUCOSE, UA: 100 mg/dL — AB
Ketones, ur: NEGATIVE mg/dL
Leukocytes, UA: NEGATIVE
Nitrite: NEGATIVE
Protein, ur: 30 mg/dL — AB
SPECIFIC GRAVITY, URINE: 1.011 (ref 1.005–1.030)
Urobilinogen, UA: 0.2 mg/dL (ref 0.0–1.0)
pH: 5 (ref 5.0–8.0)

## 2014-11-03 LAB — BASIC METABOLIC PANEL
ANION GAP: 7 (ref 5–15)
ANION GAP: 8 (ref 5–15)
BUN: 19 mg/dL (ref 6–23)
BUN: 20 mg/dL (ref 6–23)
CALCIUM: 8.4 mg/dL (ref 8.4–10.5)
CHLORIDE: 107 mmol/L (ref 96–112)
CO2: 24 mmol/L (ref 19–32)
CO2: 24 mmol/L (ref 19–32)
Calcium: 9 mg/dL (ref 8.4–10.5)
Chloride: 109 mmol/L (ref 96–112)
Creatinine, Ser: 1.76 mg/dL — ABNORMAL HIGH (ref 0.50–1.10)
Creatinine, Ser: 1.93 mg/dL — ABNORMAL HIGH (ref 0.50–1.10)
GFR calc Af Amer: 35 mL/min — ABNORMAL LOW (ref >90)
GFR calc Af Amer: 31 mL/min — ABNORMAL LOW (ref >90)
GFR calc non Af Amer: 30 mL/min — ABNORMAL LOW (ref >90)
GFR calc non Af Amer: 27 mL/min — ABNORMAL LOW (ref >90)
GLUCOSE: 152 mg/dL — AB (ref 70–99)
GLUCOSE: 120 mg/dL — AB (ref 70–99)
POTASSIUM: 4 mmol/L (ref 3.5–5.1)
Potassium: 3.9 mmol/L (ref 3.5–5.1)
Sodium: 140 mmol/L (ref 135–145)
Sodium: 139 mmol/L (ref 135–145)

## 2014-11-03 LAB — GLUCOSE, CAPILLARY
GLUCOSE-CAPILLARY: 107 mg/dL — AB (ref 70–99)
Glucose-Capillary: 138 mg/dL — ABNORMAL HIGH (ref 70–99)
Glucose-Capillary: 132 mg/dL — ABNORMAL HIGH (ref 70–99)
Glucose-Capillary: 88 mg/dL (ref 70–99)

## 2014-11-03 LAB — EXPECTORATED SPUTUM ASSESSMENT W GRAM STAIN, RFLX TO RESP C

## 2014-11-03 LAB — BRAIN NATRIURETIC PEPTIDE

## 2014-11-03 LAB — SODIUM, URINE, RANDOM: Sodium, Ur: 108 mmol/L

## 2014-11-03 LAB — URINE MICROSCOPIC-ADD ON

## 2014-11-03 LAB — EXPECTORATED SPUTUM ASSESSMENT W REFEX TO RESP CULTURE

## 2014-11-03 LAB — CREATININE, URINE, RANDOM: Creatinine, Urine: 37.2 mg/dL

## 2014-11-03 LAB — TROPONIN I

## 2014-11-03 MED ORDER — FUROSEMIDE 10 MG/ML IJ SOLN
80.0000 mg | Freq: Once | INTRAMUSCULAR | Status: AC
  Administered 2014-11-03: 80 mg via INTRAVENOUS
  Filled 2014-11-03: qty 8

## 2014-11-03 MED ORDER — ALBUTEROL SULFATE (2.5 MG/3ML) 0.083% IN NEBU: 5.0000 mg | INHALATION_SOLUTION | Freq: Four times a day (QID) | RESPIRATORY_TRACT | Status: DC | PRN

## 2014-11-03 MED ORDER — PANTOPRAZOLE SODIUM 40 MG PO TBEC
40.0000 mg | DELAYED_RELEASE_TABLET | Freq: Every day | ORAL | Status: DC
  Administered 2014-11-03: 40 mg via ORAL
  Filled 2014-11-03: qty 1

## 2014-11-03 MED ORDER — LISINOPRIL 2.5 MG PO TABS: 2.5000 mg | ORAL_TABLET | Freq: Every day | ORAL | Status: DC

## 2014-11-03 MED ORDER — TRAZODONE HCL 50 MG PO TABS
50.0000 mg | ORAL_TABLET | Freq: Every evening | ORAL | Status: DC | PRN
  Administered 2014-11-03 – 2014-11-06 (×5): 50 mg via ORAL
  Filled 2014-11-03 (×6): qty 1

## 2014-11-03 MED ORDER — ASPIRIN 81 MG PO CHEW
81.0000 mg | CHEWABLE_TABLET | Freq: Every day | ORAL | Status: DC
  Administered 2014-11-03 – 2014-11-08 (×5): 81 mg via ORAL
  Filled 2014-11-03 (×5): qty 1

## 2014-11-03 MED ORDER — ACETAMINOPHEN-CODEINE #3 300-30 MG PO TABS
2.0000 | ORAL_TABLET | Freq: Once | ORAL | Status: AC
  Administered 2014-11-03: 2 via ORAL
  Filled 2014-11-03: qty 2

## 2014-11-03 MED ORDER — ALBUTEROL SULFATE (2.5 MG/3ML) 0.083% IN NEBU: 5.0000 mg | INHALATION_SOLUTION | RESPIRATORY_TRACT | Status: DC | PRN

## 2014-11-03 MED ORDER — BENZONATATE 100 MG PO CAPS
200.0000 mg | ORAL_CAPSULE | Freq: Two times a day (BID) | ORAL | Status: DC
  Administered 2014-11-03 – 2014-11-05 (×5): 200 mg via ORAL
  Filled 2014-11-03 (×6): qty 2

## 2014-11-03 NOTE — Care Management Note (Signed)
    Page 1 of 1   11/09/2014     11:32:09 AM CARE MANAGEMENT NOTE 11/09/2014  Patient:  [name] [name], [name] [name]   Account Number:  [id number]  Date Initiated:  11/08/2014  Documentation initiated by:  Gunhild Bautch  Subjective/Objective Assessment:   pt adm on 11/02/14 with pulm edema, dyspnea, AKI, and CHF. PTA, pt resides at home alone.     Action/Plan:   Will follow for dc needs at pt progresses.   Anticipated DC Date:  11/08/2014   Anticipated DC Plan:  HOME/SELF CARE  In-house referral  Clinical Social Worker      DC Planning Services  CM consult      Choice offered to / List presented to:             Status of service:  Completed, signed off Medicare Important Message given?  NO (If response is "NO", the following Medicare IM given date fields will be blank) Date Medicare IM given:   Medicare IM given by:   Date Additional Medicare IM given:   Additional Medicare IM given by:    Discharge Disposition:    Per UR Regulation:  Reviewed for med. necessity/level of care/duration of stay  If discussed at [place] of Stay Meetings, dates discussed:    Comments:  11/08/14 Ellan Lambert, RN, BSN [phone] PT/OT recommending no OP follow up.  pt for dc today; CSW consulted for transportation needs.

## 2014-11-03 NOTE — Progress Notes (Signed)
  Echocardiogram 2D Echocardiogram has been performed.  Fairley, Rin Gorton 11/03/2014, 1:06 PM

## 2014-11-03 NOTE — Progress Notes (Signed)
UR completed 

## 2014-11-03 NOTE — Progress Notes (Signed)
Dr. Genene Churn notified that patient had episode of orange emesis.  Per patient, felt nauseous after taking trazodone.  Administered zofran, will continue to monitor.

## 2014-11-03 NOTE — Progress Notes (Signed)
Subjective:  Pt seen and examined in AM. Overnight she was in respiratory distress and was given 500 cc bolus.  She reports that she is still short of breath which is worse if she lies flat and also has productive cough with wheezing. She reports running out of her medications including lasix for at least the past few days. She has mild right LE edema. She reports some improvement with breathing treatments.  She denies dietary indiscretion or recent weight gain.    Objective: Vital signs in last 24 hours: Filed Vitals:   11/03/14 0221 11/03/14 0459 11/03/14 0535 11/03/14 0834  BP: 155/78 133/63  146/76  Pulse: 62 60  60  Temp: 98 F (36.7 C) 97.6 F (36.4 C)  97.9 F (36.6 C)  TempSrc: Oral Oral  Oral  Resp: 17 20  183  Height:      Weight:   116 lb 11.2 oz (52.935 kg)   SpO2: 100% 93%  100%   Weight change:   Intake/Output Summary (Last 24 hours) at 11/03/14 1512 Last data filed at 11/03/14 1352  Gross per 24 hour  Intake   1903 ml  Output    625 ml  Net   1278 ml   PHYSICAL EXAMINATION: General: Alert, well-developed, and cooperative to examination.  Lungs: Tachypneic with coarse BS with no ronchi, wheezes, or crackles . Heart: Normal rate, regular rhythm, no murmur  Abdomen: Soft, non-tender, normal bowel sounds, no distention, no guarding, no rebound tenderness, no hepatomegaly, and no splenomegaly.  Msk: Left BKA  Extremities: Mild right  LE edema  Neurologic: Alert & oriented X3   Lab Results: Basic Metabolic Panel:  Recent Labs Lab 11/02/14 1019 11/03/14 0524  NA 141 140  K 4.0 3.9  CL 112 109  CO2 23 24  GLUCOSE 97 152*  BUN 12 19  CREATININE 1.64* 1.76*  CALCIUM 9.1 8.4   CBC:  Recent Labs Lab 11/02/14 1019  WBC 4.2  HGB 12.7  HCT 37.5  MCV 87.8  PLT 165   Cardiac Enzymes:  Recent Labs Lab 11/02/14 1920 11/02/14 2230 11/03/14 0524  TROPONINI <0.03 <0.03 <0.03   CBG:  Recent Labs Lab 11/02/14 1721 11/02/14 2221  11/03/14 0618 11/03/14 1149  GLUCAP 252* 236* 138* 132*   Thyroid Function Tests:  Recent Labs Lab 11/02/14 1920  TSH 0.612   Urinalysis:  Recent Labs Lab 11/02/14 2322  COLORURINE YELLOW  LABSPEC 1.011  PHURINE 5.0  GLUCOSEU 100*  HGBUR TRACE*  BILIRUBINUR NEGATIVE  KETONESUR NEGATIVE  PROTEINUR 30*  UROBILINOGEN 0.2  NITRITE NEGATIVE  LEUKOCYTESUR NEGATIVE     Micro Results: Recent Results (from the past 240 hour(s))  MRSA PCR Screening     Status: Abnormal   Collection Time: 11/02/14  7:20 PM  Result Value Ref Range Status   MRSA by PCR POSITIVE (A) NEGATIVE Final    Comment:        The GeneXpert MRSA Assay (FDA approved for NASAL specimens only), is one component of a comprehensive MRSA colonization surveillance program. It is not intended to diagnose MRSA infection nor to guide or monitor treatment for MRSA infections. RESULT CALLED TO, READ BACK BY AND VERIFIED WITHRenard Hamper RN 2201 11/02/14 A BROWNING    Studies/Results: Dg Neck Soft Tissue  11/02/2014   CLINICAL DATA:  Stridor.  Respiratory distress and cough.  EXAM: NECK SOFT TISSUES - 1+ VIEW  COMPARISON:  None.  FINDINGS: There is no evidence of retropharyngeal soft  tissue swelling or epiglottic enlargement. The cervical airway is unremarkable and no radio-opaque foreign body identified.  IMPRESSION: Negative exam.   Electronically Signed   By: Inge Rise M.D.   On: 11/02/2014 17:05   Dg Chest 2 View  11/03/2014   CLINICAL DATA:  Shortness of breath.  EXAM: CHEST  2 VIEW  COMPARISON:  November 02, 2014.  FINDINGS: Stable cardiomegaly. Left-sided pacemaker is unchanged in position. No pneumothorax is noted. Degenerative change of left glenohumeral joint is noted. Stable mild central pulmonary vascular congestion is noted with stable bibasilar interstitial densities most consistent with pulmonary edema. Mild bilateral pleural effusions are noted as well.  IMPRESSION: Stable findings consistent with  congestive heart failure.   Electronically Signed   By: Sabino Dick M.D.   On: 11/03/2014 09:12   Dg Chest Port 1 View  11/02/2014   CLINICAL DATA:  Shortness of breath, nausea, sore throat  EXAM: PORTABLE CHEST - 1 VIEW  COMPARISON:  12/09/2012  FINDINGS: Stable orientation of dual-chamber pacer leads from the left.  Mild cardiomegaly is unchanged. Fullness of the hila which is new and likely congestion. There is interstitial coarsening and haziness of the lower chest compatible with pleural effusions.  Posttraumatic deformity of the left proximal humerus.  IMPRESSION: Mild CHF.   Electronically Signed   By: Jorje Guild M.D.   On: 11/02/2014 11:09   Medications: I have reviewed the patient's current medications. Scheduled Meds: . antiseptic oral rinse  7 mL Mouth Rinse q12n4p  . benzonatate  200 mg Oral BID  . carvedilol  12.5 mg Oral BID WC  . chlorhexidine  15 mL Mouth Rinse BID  . Chlorhexidine Gluconate Cloth  6 each Topical Q0600  . gabapentin  400 mg Oral TID  . heparin  5,000 Units Subcutaneous 3 times per day  . insulin aspart  0-5 Units Subcutaneous QHS  . insulin aspart  0-9 Units Subcutaneous TID WC  . isosorbide mononitrate  60 mg Oral Daily  . latanoprost  1 drop Both Eyes QHS  . mupirocin ointment  1 application Nasal BID  . pantoprazole  40 mg Oral Daily  . prednisoLONE acetate  1 drop Left Eye QID  . sodium chloride  3 mL Intravenous Q12H  . sodium chloride  3 mL Intravenous Q12H   Continuous Infusions:  PRN Meds:.sodium chloride, ondansetron **OR** ondansetron (ZOFRAN) IV, sodium chloride, traZODone Assessment/Plan: Principal Problem:   Pulmonary edema Active Problems:   Hyperlipidemia   TOBACCO USER   Essential hypertension   Type 2 diabetes mellitus with diabetic chronic kidney disease   CKD stage 3 due to type 2 diabetes mellitus   SOB (shortness of breath)   Dyspnea   Pleural effusion   Acute on chronic exacerbation of combined CHF in setting of  restrictive cardiomyopathy - Pt with continued dyspnea currently with SpO2 of 100% on 2L [place]. Trigger most likely due to noncompliance with medical therapy vs ischemia in setting of CAD (last cath 05/2004 with mild obstruction) however troponins x3 negative no EKG changes. Cause of restrictive disease unclear. Chest xray with mild pulmonary edema and and mild bilateral pleural effusions. 2D-echo with worsening EF from 55% in 2011 to 20-25% with mild LVH, diffuse hypokinesis, and diastolic dysfunction. Pt has ICD in place. Repeat BNP still  >4500. Pt with minimal diuresis yesterday (925 cc) after IV 60 mg lasix, also received 500 cc bolus last night.  -Continue oxygen therapy to keep SpO2 >92% -Pt received IV lasix 80  x 1 dose and PO 40 mg x 1 dose, reassess dosage in AM -Continue daily weights and strict I & O's -Continue monitoring BMP in setting of CKD Stage 3 -Consider consulting cardiology if unable to adequately diurese -Start tessalon 200 mg BID for cough and f/u respiratory panel panel and cultures (PCT 0.10) -Albuterol nebulizer Q 6 hr PRN  -Interrogate ICD and monitor on telemetry -Continue imdur 60 mg daily and carvedilol 12.5 mg BID   -Avoid CCB in setting of reduced EF -Consider starting lisinopril 2.5 mg daily due to CrCl <30 (max 40 mg daily) once renal function stable   CKD Stage 3 - Cr 1.76 today from 1.64 on admission, last Cr 1.57 and GFR 41 on 09/11/14. FeNa 3.7%.UA with mild proteinuria.  Pt with secondary hyperparathyroidism.  -Continue daily weights and strict I & O's -Continue monitoring BMP in setting of aggressive diuresis   -Avoid nephrotoxins -Pt follows with [facility]   Hypertension - Currently mildly hypertensive 146/76 -Continue imdur 60 mg daily and consider increasing carvedilol 12.5 mg BID to 25 mg BID  -Consider starting lisinopril 2.5 mg daily due to CrCl <30 (max 40 mg daily) once renal function stable   CAD - Currently with no anginal pain. Last  cath in 2005 with mild obstructive CAD. -Start aspirin 81 mg daily  -Continue imdur 60 mg daily and carvedilol 12.5 mg BID   -Consider starting lisinopril 2.5 mg daily due to CrCl <30 (max 40 mg daily) once renal function stable   Non-insulin dependant Type 2 DM - Last A1c 4.9 on 10/19/13 not on medical therapy. -F/u A1c -Sensitive SSI with meals and at bedtime -Continue gabapentin 400 mg TID for peripheral neuropathy  Insomnia - Currently stable -Trazadone 50 mg PRN sleep  Diet: Heart/carb modified DVT Ppx: SQ Heparin TID Code: Full   Dispo: Disposition is deferred at this time, awaiting improvement of current medical problems.  Anticipated discharge in approximately 2-3 day(s).   The patient does have a current PCP Julious Oka, MD) and does need an [facility] hospital follow-up appointment after discharge.  The patient does not have transportation limitations that hinder transportation to clinic appointments.  .Services Needed at time of discharge: Y = Yes, Blank = No PT:   OT:   RN:   Equipment:   Other:     LOS: 1 day   Juluis Mire, MD 11/03/2014, 3:12 PM

## 2014-11-03 NOTE — Progress Notes (Signed)
Interrogated pacemaker with the help of ED RN. Talked to [facility]. It looks to be working fine. They will send someone to evaluate the device in AM.

## 2014-11-04 ENCOUNTER — Encounter (HOSPITAL_COMMUNITY): Payer: Self-pay | Admitting: *Deleted

## 2014-11-04 DIAGNOSIS — A047 Enterocolitis due to Clostridium difficile: Secondary | ICD-10-CM

## 2014-11-04 DIAGNOSIS — N179 Acute kidney failure, unspecified: Secondary | ICD-10-CM

## 2014-11-04 DIAGNOSIS — A0472 Enterocolitis due to Clostridium difficile, not specified as recurrent: Secondary | ICD-10-CM | POA: Diagnosis present

## 2014-11-04 LAB — MAGNESIUM: Magnesium: 1.7 mg/dL (ref 1.5–2.5)

## 2014-11-04 LAB — GLUCOSE, CAPILLARY
Glucose-Capillary: 71 mg/dL (ref 70–99)
Glucose-Capillary: 72 mg/dL (ref 70–99)
Glucose-Capillary: 165 mg/dL — ABNORMAL HIGH (ref 70–99)
Glucose-Capillary: 125 mg/dL — ABNORMAL HIGH (ref 70–99)

## 2014-11-04 LAB — BASIC METABOLIC PANEL
ANION GAP: 9 (ref 5–15)
BUN: 26 mg/dL — AB (ref 6–23)
CALCIUM: 8.5 mg/dL (ref 8.4–10.5)
CO2: 23 mmol/L (ref 19–32)
CREATININE: 2.22 mg/dL — AB (ref 0.50–1.10)
Chloride: 107 mmol/L (ref 96–112)
GFR calc non Af Amer: 23 mL/min — ABNORMAL LOW (ref >90)
GFR, EST AFRICAN AMERICAN: 26 mL/min — AB (ref >90)
Glucose, Bld: 83 mg/dL (ref 70–99)
POTASSIUM: 4 mmol/L (ref 3.5–5.1)
SODIUM: 139 mmol/L (ref 135–145)

## 2014-11-04 LAB — HEMOGLOBIN A1C
HEMOGLOBIN A1C: 5.1 % (ref 4.8–5.6)
Mean Plasma Glucose: 100 mg/dL

## 2014-11-04 LAB — CBC
HCT: 32.7 % — ABNORMAL LOW (ref 36.0–46.0)
Hemoglobin: 10.5 g/dL — ABNORMAL LOW (ref 12.0–15.0)
MCH: 29.6 pg (ref 26.0–34.0)
MCHC: 32.1 g/dL (ref 30.0–36.0)
MCV: 92.1 fL (ref 78.0–100.0)
Platelets: 144 10*3/uL — ABNORMAL LOW (ref 150–400)
RBC: 3.55 MIL/uL — ABNORMAL LOW (ref 3.87–5.11)
RDW: 14.2 % (ref 11.5–15.5)
WBC: 5.8 10*3/uL (ref 4.0–10.5)

## 2014-11-04 LAB — CLOSTRIDIUM DIFFICILE BY PCR: CDIFFPCR: POSITIVE — AB

## 2014-11-04 MED ORDER — ACETAMINOPHEN 325 MG PO TABS
650.0000 mg | ORAL_TABLET | Freq: Four times a day (QID) | ORAL | Status: DC | PRN
  Administered 2014-11-04 – 2014-11-08 (×2): 650 mg via ORAL
  Filled 2014-11-04 (×2): qty 2

## 2014-11-04 MED ORDER — METRONIDAZOLE 500 MG PO TABS
500.0000 mg | ORAL_TABLET | Freq: Three times a day (TID) | ORAL | Status: DC
  Administered 2014-11-04 – 2014-11-08 (×12): 500 mg via ORAL
  Filled 2014-11-04 (×18): qty 1

## 2014-11-04 MED ORDER — DIPHENOXYLATE-ATROPINE 2.5-0.025 MG/5ML PO LIQD
10.0000 mL | Freq: Four times a day (QID) | ORAL | Status: DC | PRN
  Filled 2014-11-04: qty 10

## 2014-11-04 MED ORDER — FUROSEMIDE 40 MG PO TABS
40.0000 mg | ORAL_TABLET | Freq: Every day | ORAL | Status: DC
  Administered 2014-11-04: 40 mg via ORAL
  Filled 2014-11-04 (×2): qty 1

## 2014-11-04 NOTE — Progress Notes (Signed)
Patient's AM CBG 71.  Patient given apple juice and educated to call RN if experiencing any symptoms of hypoglycemia.  Patient voiced agreement.  Will continue to monitor.

## 2014-11-04 NOTE — Progress Notes (Signed)
Nurse notified Md of internal med that pt's stool sample positive for c-dif and that enteric precautions have been initiated.  Md to dc lomitol adn start pt on flagyl.  Will monitor   Angeline Slim I 11/04/2014 11:26 AM

## 2014-11-04 NOTE — Progress Notes (Signed)
Subjective:  Pt seen and examined in AM. Overnight she had left shoulder pain requiring tylenol. She reports her breathing is much better today as well as cough and wheezing. She reports having good urine output. She has been without her home lasix for at least the past 2 weeks because she could not afford it. She continues to have watery non-bloody diarrhea and takes lomotil at home for loose stools. She denies fever, chills, chest pain, abdominal pain, nausea, vomiting, LE swelling, or lightheadedness with standing.    Objective: Vital signs in last 24 hours: Filed Vitals:   11/03/14 2151 11/04/14 0206 11/04/14 0709 11/04/14 0753  BP: 144/68 140/70 142/78 152/72  Pulse: 60 62 66 83  Temp: 97.6 F (36.4 C) 97.9 F (36.6 C) 97.4 F (36.3 C)   TempSrc: Oral Oral Oral   Resp: 18 20 20 20   Height:      Weight:   115 lb 6.2 oz (52.34 kg)   SpO2: 100% 100% 96% 100%   Weight change:   Intake/Output Summary (Last 24 hours) at 11/04/14 1004 Last data filed at 11/04/14 0753  Gross per 24 hour  Intake   1613 ml  Output    825 ml  Net    788 ml   PHYSICAL EXAMINATION: General: Alert, well-developed, and cooperative to examination.  Lungs: Decreased BS at bases with no ronchi, wheezes, or crackles. Heart: Normal rate and regular rhythm Abdomen: Soft, non-tender, normal bowel sounds, no distention, no guarding, no rebound tenderness  Msk: Left BKA  Extremities: Trace right LE edema  Neurologic: Alert & oriented X3   Lab Results: Basic Metabolic Panel:  Recent Labs Lab 11/03/14 1845 11/04/14 0351  NA 139 139  K 4.0 4.0  CL 107 107  CO2 24 23  GLUCOSE 120* 83  BUN 20 26*  CREATININE 1.93* 2.22*  CALCIUM 9.0 8.5  MG  --  1.7   CBC:  Recent Labs Lab 11/02/14 1019 11/04/14 0351  WBC 4.2 5.8  HGB 12.7 10.5*  HCT 37.5 32.7*  MCV 87.8 92.1  PLT 165 144*   Cardiac Enzymes:  Recent Labs Lab 11/02/14 1920 11/02/14 2230 11/03/14 0524  TROPONINI <0.03 <0.03  <0.03   CBG:  Recent Labs Lab 11/02/14 2221 11/03/14 0618 11/03/14 1149 11/03/14 1706 11/03/14 2150 11/04/14 0617  GLUCAP 236* 138* 132* 88 107* 71   Thyroid Function Tests:  Recent Labs Lab 11/02/14 1920  TSH 0.612   Urinalysis:  Recent Labs Lab 11/02/14 2322  COLORURINE YELLOW  LABSPEC 1.011  PHURINE 5.0  GLUCOSEU 100*  HGBUR TRACE*  BILIRUBINUR NEGATIVE  KETONESUR NEGATIVE  PROTEINUR 30*  UROBILINOGEN 0.2  NITRITE NEGATIVE  LEUKOCYTESUR NEGATIVE     Micro Results: Recent Results (from the past 240 hour(s))  MRSA PCR Screening     Status: Abnormal   Collection Time: 11/02/14  7:20 PM  Result Value Ref Range Status   MRSA by PCR POSITIVE (A) NEGATIVE Final    Comment:        The GeneXpert MRSA Assay (FDA approved for NASAL specimens only), is one component of a comprehensive MRSA colonization surveillance program. It is not intended to diagnose MRSA infection nor to guide or monitor treatment for MRSA infections. RESULT CALLED TO, READ BACK BY AND VERIFIED WITHRenard Hamper RN 2201 11/02/14 A BROWNING   Culture, expectorated sputum-assessment     Status: None   Collection Time: 11/03/14  2:35 PM  Result Value Ref Range Status  Specimen Description SPUTUM  Final   Special Requests NONE  Final   Sputum evaluation   Final    THIS SPECIMEN IS ACCEPTABLE. RESPIRATORY CULTURE REPORT TO FOLLOW.   Report Status 11/03/2014 FINAL  Final   Studies/Results: Dg Neck Soft Tissue  11/02/2014   CLINICAL DATA:  [place].  Respiratory distress and cough.  EXAM: NECK SOFT TISSUES - 1+ VIEW  COMPARISON:  None.  FINDINGS: There is no evidence of retropharyngeal soft tissue swelling or epiglottic enlargement. The cervical airway is unremarkable and no radio-opaque foreign body identified.  IMPRESSION: Negative exam.   Electronically Signed   By: Inge Rise M.D.   On: 11/02/2014 17:05   Dg Chest 2 View  11/03/2014   CLINICAL DATA:  Shortness of breath.  EXAM: CHEST   2 VIEW  COMPARISON:  November 02, 2014.  FINDINGS: Stable cardiomegaly. Left-sided pacemaker is unchanged in position. No pneumothorax is noted. Degenerative change of left glenohumeral joint is noted. Stable mild central pulmonary vascular congestion is noted with stable bibasilar interstitial densities most consistent with pulmonary edema. Mild bilateral pleural effusions are noted as well.  IMPRESSION: Stable findings consistent with congestive heart failure.   Electronically Signed   By: Sabino Dick M.D.   On: 11/03/2014 09:12   Dg Chest Port 1 View  11/02/2014   CLINICAL DATA:  Shortness of breath, nausea, sore throat  EXAM: PORTABLE CHEST - 1 VIEW  COMPARISON:  12/09/2012  FINDINGS: Stable orientation of dual-chamber pacer leads from the left.  Mild cardiomegaly is unchanged. Fullness of the hila which is new and likely congestion. There is interstitial coarsening and haziness of the lower chest compatible with pleural effusions.  Posttraumatic deformity of the left proximal humerus.  IMPRESSION: Mild CHF.   Electronically Signed   By: Jorje Guild M.D.   On: 11/02/2014 11:09   Medications: I have reviewed the patient's current medications. Scheduled Meds: . antiseptic oral rinse  7 mL Mouth Rinse q12n4p  . aspirin  81 mg Oral Daily  . benzonatate  200 mg Oral BID  . carvedilol  12.5 mg Oral BID WC  . chlorhexidine  15 mL Mouth Rinse BID  . Chlorhexidine Gluconate Cloth  6 each Topical Q0600  . furosemide  40 mg Oral Q breakfast  . gabapentin  400 mg Oral TID  . heparin  5,000 Units Subcutaneous 3 times per day  . isosorbide mononitrate  60 mg Oral Daily  . latanoprost  1 drop Both Eyes QHS  . mupirocin ointment  1 application Nasal BID  . prednisoLONE acetate  1 drop Left Eye QID  . sodium chloride  3 mL Intravenous Q12H  . sodium chloride  3 mL Intravenous Q12H   Continuous Infusions:  PRN Meds:.sodium chloride, acetaminophen, albuterol, diphenoxylate-atropine, ondansetron **OR**  ondansetron (ZOFRAN) IV, sodium chloride, traZODone Assessment/Plan: Principal Problem:   Pulmonary edema Active Problems:   Hyperlipidemia   TOBACCO USER   Essential hypertension   Type 2 diabetes mellitus with diabetic chronic kidney disease   CKD stage 3 due to type 2 diabetes mellitus   SOB (shortness of breath)   Dyspnea   Pleural effusion   Acute on chronic combined systolic and diastolic CHF (congestive heart failure)   Acute on chronic exacerbation of combined CHF in setting of restrictive cardiomyopathy - Pt with improved dyspnea after aggressive diuresis, currently with SpO2 of 100% on 2L [place]. Urine output 825 mL yesterday with 8 lb wt loss since admission. Trigger most likely due  to noncompliance with medical therapy. Cause of restrictive disease unclear. 2D-echo with worsening EF from 55% in 2011 to 20-25% with mild LVH, diffuse hypokinesis, and diastolic dysfunction. Pt has ICD in place.    -Continue oxygen therapy to keep SpO2 >92% -Transition from IV lasix to home PO lasix 40 mg daily due to increase in Cr from 1.76 to 2.22 and resolution of dyspnea -Continue daily weights and strict I & O's -Continue monitoring BMP in setting of AKI on CKD Stage 3 -Continue tessalon 200 mg BID for cough and f/u respiratory panel panel and cultures (PCT 0.10) -Continue albuterol nebulizer Q 6 hr PRN  -Continue to monitor on telemetry -Continue imdur 60 mg daily and carvedilol 12.5 mg BID    -Avoid CCB in setting of reduced EF -Consider starting lisinopril 2.5 mg daily due to CrCl <30 (max 40 mg daily) once renal function stable  -CM following, pt states she will not be able to afford medications until she receives monthly check on Feb 15th.  -Obtain PT consult   Non-oliguric AKI on CKD Stage 3 - Cr 2.22 from 1.76 yesterday with baseline 1.6. Urine output 825 mL yesterday. FeNa 3.7% suggesting renal causes.UA with mild proteinuria.  Pt also  with secondary hyperparathyroidism.  -Transition  IV lasix to home PO lasix 40 mg daily  -Continue daily weights and strict I & O's -Avoid nephrotoxins -Pt follows with [facility]   1st episode of non-severe C diff diarrhea - Currently with non-bloody stools with no recent antibiotics/hospitalization.  -Start PO flagyl 500 mg TID for 14 days  Hypertension - Currently mildly hypertensive 152/72 -Continue imdur 60 mg daily and consider increasing carvedilol 12.5 mg BID to 25 mg BID  -Consider starting lisinopril 2.5 mg daily due to CrCl <30 (max 40 mg daily) once renal function stable  -Transition IV lasix to home PO lasix 40 mg daily   CAD - Currently with no anginal pain. Last cath in 2005 with mild obstructive CAD. -Continue newly started aspirin 81 mg daily  -Continue imdur 60 mg daily and carvedilol 12.5 mg BID   -Consider starting lisinopril 2.5 mg daily due to CrCl <30 (max 40 mg daily) once renal function stable   Non-insulin dependant Type 2 DM - Last CBG 72. Last A1c 5.1 on 11/02/14 not on medical therapy. -Discontinue SSI due to symptomatic hypoglycemia today (CBG 71)  -Continue gabapentin 400 mg TID for peripheral neuropathy  Insomnia - Currently stable -Trazadone 50 mg PRN sleep  Diet: Heart/carb modified DVT Ppx: SQ Heparin TID Code: Full   Dispo: Disposition is deferred at this time, awaiting improvement of current medical problems.  Anticipated discharge in approximately 2 day(s).   The patient does have a current PCP Julious Oka, MD) and does need an [facility] hospital follow-up appointment after discharge.  The patient does not have transportation limitations that hinder transportation to clinic appointments.  .Services Needed at time of discharge: Y = Yes, Blank = No PT:   OT:   RN:   Equipment:   Other:     LOS: 2 days   Juluis Mire, MD 11/04/2014, 10:04 AM

## 2014-11-04 NOTE — Progress Notes (Signed)
Dr. Genene Churn notified as patient experiencing pain in left shoulder "from bursitis".  Acetaminophen ordered PRN.  Patient made aware, acetaminophen administered.  Will continue to monitor.

## 2014-11-05 ENCOUNTER — Encounter (HOSPITAL_COMMUNITY): Payer: Self-pay | Admitting: Nurse Practitioner

## 2014-11-05 DIAGNOSIS — R0602 Shortness of breath: Secondary | ICD-10-CM

## 2014-11-05 DIAGNOSIS — I10 Essential (primary) hypertension: Secondary | ICD-10-CM

## 2014-11-05 DIAGNOSIS — D649 Anemia, unspecified: Secondary | ICD-10-CM

## 2014-11-05 DIAGNOSIS — I429 Cardiomyopathy, unspecified: Secondary | ICD-10-CM

## 2014-11-05 DIAGNOSIS — E1122 Type 2 diabetes mellitus with diabetic chronic kidney disease: Secondary | ICD-10-CM

## 2014-11-05 DIAGNOSIS — J810 Acute pulmonary edema: Secondary | ICD-10-CM

## 2014-11-05 LAB — BASIC METABOLIC PANEL
ANION GAP: 4 — AB (ref 5–15)
BUN: 34 mg/dL — AB (ref 6–23)
CHLORIDE: 109 mmol/L (ref 96–112)
CO2: 28 mmol/L (ref 19–32)
Calcium: 8.4 mg/dL (ref 8.4–10.5)
Creatinine, Ser: 2.17 mg/dL — ABNORMAL HIGH (ref 0.50–1.10)
GFR calc Af Amer: 27 mL/min — ABNORMAL LOW (ref >90)
GFR, EST NON AFRICAN AMERICAN: 23 mL/min — AB (ref >90)
Glucose, Bld: 85 mg/dL (ref 70–99)
Potassium: 3.5 mmol/L (ref 3.5–5.1)
Sodium: 141 mmol/L (ref 135–145)

## 2014-11-05 LAB — GLUCOSE, CAPILLARY
GLUCOSE-CAPILLARY: 114 mg/dL — AB (ref 70–99)
GLUCOSE-CAPILLARY: 106 mg/dL — AB (ref 70–99)
GLUCOSE-CAPILLARY: 154 mg/dL — AB (ref 70–99)
Glucose-Capillary: 88 mg/dL (ref 70–99)

## 2014-11-05 LAB — CBC
HCT: 30.7 % — ABNORMAL LOW (ref 36.0–46.0)
HEMOGLOBIN: 10.1 g/dL — AB (ref 12.0–15.0)
MCH: 29.4 pg (ref 26.0–34.0)
MCHC: 32.9 g/dL (ref 30.0–36.0)
MCV: 89.5 fL (ref 78.0–100.0)
Platelets: 134 10*3/uL — ABNORMAL LOW (ref 150–400)
RBC: 3.43 MIL/uL — AB (ref 3.87–5.11)
RDW: 14.1 % (ref 11.5–15.5)
WBC: 5.5 10*3/uL (ref 4.0–10.5)

## 2014-11-05 LAB — CULTURE, RESPIRATORY W GRAM STAIN: Culture: NORMAL

## 2014-11-05 LAB — CULTURE, RESPIRATORY

## 2014-11-05 LAB — MAGNESIUM: MAGNESIUM: 1.8 mg/dL (ref 1.5–2.5)

## 2014-11-05 MED ORDER — FUROSEMIDE 40 MG PO TABS
40.0000 mg | ORAL_TABLET | Freq: Every day | ORAL | Status: DC
  Administered 2014-11-05: 40 mg via ORAL
  Filled 2014-11-05: qty 1

## 2014-11-05 MED ORDER — FUROSEMIDE 10 MG/ML IJ SOLN
40.0000 mg | Freq: Every day | INTRAMUSCULAR | Status: DC
  Administered 2014-11-05: 40 mg via INTRAVENOUS
  Filled 2014-11-05 (×2): qty 4

## 2014-11-05 MED ORDER — BENZONATATE 100 MG PO CAPS
200.0000 mg | ORAL_CAPSULE | Freq: Two times a day (BID) | ORAL | Status: DC | PRN
  Administered 2014-11-05: 200 mg via ORAL
  Filled 2014-11-05 (×2): qty 2

## 2014-11-05 MED ORDER — HYDRALAZINE HCL 10 MG PO TABS
10.0000 mg | ORAL_TABLET | Freq: Three times a day (TID) | ORAL | Status: DC
  Administered 2014-11-05 – 2014-11-06 (×3): 10 mg via ORAL
  Filled 2014-11-05 (×7): qty 1

## 2014-11-05 NOTE — Progress Notes (Signed)
Subjective:  Pt seen and examined in AM. No acute events overnight. She continues to breath well with no cough, wheezing, or chest pain. She reports having good urine output however only 350 cc recorded. Her watery non-bloody diarrhea is better and she had 2 BM recorded yesterday. She has mild lower abdominal tenderness but denies fever, chills, nausea, vomiting, right LE swelling, or lightheadedness with standing.    Objective: Vital signs in last 24 hours: Filed Vitals:   11/04/14 1400 11/04/14 1605 11/04/14 2043 11/05/14 0701  BP: 140/66 143/68 123/57 153/73  Pulse: 80 60 62 60  Temp: 97.3 F (36.3 C)  98.2 F (36.8 C) 98 F (36.7 C)  TempSrc: Oral  Oral Axillary  Resp: 20  18 18   Height:      Weight:    116 lb 6.5 oz (52.8 kg)  SpO2: 100%  100% 96%   Weight change:   Intake/Output Summary (Last 24 hours) at 11/05/14 1307 Last data filed at 11/05/14 0942  Gross per 24 hour  Intake    700 ml  Output      0 ml  Net    700 ml   PHYSICAL EXAMINATION: General: Alert, well-developed, and cooperative to examination.  Lungs: Decreased BS at bases with no ronchi, wheezes, or crackles. Heart: Normal rate and regular rhythm Abdomen: Soft, mild lower abdominal tenderness, normal bowel sounds, no distention, no guarding, no rebound tenderness  Msk: Left BKA  Extremities: Trace right LE edema  Neurologic: Alert & oriented X3   Lab Results: Basic Metabolic Panel:  Recent Labs Lab 11/04/14 0351 11/05/14 0508  NA 139 141  K 4.0 3.5  CL 107 109  CO2 23 28  GLUCOSE 83 85  BUN 26* 34*  CREATININE 2.22* 2.17*  CALCIUM 8.5 8.4  MG 1.7 1.8   CBC:  Recent Labs Lab 11/04/14 0351 11/05/14 0508  WBC 5.8 5.5  HGB 10.5* 10.1*  HCT 32.7* 30.7*  MCV 92.1 89.5  PLT 144* 134*   Cardiac Enzymes:  Recent Labs Lab 11/02/14 1920 11/02/14 2230 11/03/14 0524  TROPONINI <0.03 <0.03 <0.03   CBG:  Recent Labs Lab 11/04/14 0617 11/04/14 1234 11/04/14 1654  11/04/14 2044 11/05/14 0749 11/05/14 1213  GLUCAP 71 72 165* 125* 88 114*   Thyroid Function Tests:  Recent Labs Lab 11/02/14 1920  TSH 0.612   Urinalysis:  Recent Labs Lab 11/02/14 2322  COLORURINE YELLOW  LABSPEC 1.011  PHURINE 5.0  GLUCOSEU 100*  HGBUR TRACE*  BILIRUBINUR NEGATIVE  KETONESUR NEGATIVE  PROTEINUR 30*  UROBILINOGEN 0.2  NITRITE NEGATIVE  LEUKOCYTESUR NEGATIVE     Micro Results: Recent Results (from the past 240 hour(s))  MRSA PCR Screening     Status: Abnormal   Collection Time: 11/02/14  7:20 PM  Result Value Ref Range Status   MRSA by PCR POSITIVE (A) NEGATIVE Final    Comment:        The GeneXpert MRSA Assay (FDA approved for NASAL specimens only), is one component of a comprehensive MRSA colonization surveillance program. It is not intended to diagnose MRSA infection nor to guide or monitor treatment for MRSA infections. RESULT CALLED TO, READ BACK BY AND VERIFIED WITHRenard Hamper RN 2201 11/02/14 A BROWNING   Culture, expectorated sputum-assessment     Status: None   Collection Time: 11/03/14  2:35 PM  Result Value Ref Range Status   Specimen Description SPUTUM  Final   Special Requests NONE  Final   Sputum  evaluation   Final    THIS SPECIMEN IS ACCEPTABLE. RESPIRATORY CULTURE REPORT TO FOLLOW.   Report Status 11/03/2014 FINAL  Final  Culture, respiratory (NON-Expectorated)     Status: None (Preliminary result)   Collection Time: 11/03/14  2:35 PM  Result Value Ref Range Status   Specimen Description SPUTUM  Final   Special Requests NONE  Final   Gram Stain   Final    FEW WBC PRESENT,BOTH PMN AND MONONUCLEAR RARE SQUAMOUS EPITHELIAL CELLS PRESENT FEW GRAM POSITIVE COCCI IN PAIRS IN CLUSTERS RARE GRAM NEGATIVE RODS Performed at [facility]    Culture   Final    NORMAL OROPHARYNGEAL FLORA Performed at [facility]    Report Status PENDING  Incomplete  Clostridium Difficile by PCR     Status: Abnormal    Collection Time: 11/03/14  9:48 PM  Result Value Ref Range Status   C difficile by pcr POSITIVE (A) NEGATIVE Final    Comment: CRITICAL RESULT CALLED TO, READ BACK BY AND VERIFIED WITH: VASIADIS,A RN 11/03/14 1119 WOOTEN,K    Studies/Results: No results found. Medications: I have reviewed the patient's current medications. Scheduled Meds: . antiseptic oral rinse  7 mL Mouth Rinse q12n4p  . aspirin  81 mg Oral Daily  . benzonatate  200 mg Oral BID  . carvedilol  12.5 mg Oral BID WC  . chlorhexidine  15 mL Mouth Rinse BID  . Chlorhexidine Gluconate Cloth  6 each Topical Q0600  . furosemide  40 mg Oral Daily  . gabapentin  400 mg Oral TID  . heparin  5,000 Units Subcutaneous 3 times per day  . isosorbide mononitrate  60 mg Oral Daily  . latanoprost  1 drop Both Eyes QHS  . metroNIDAZOLE  500 mg Oral 3 times per day  . mupirocin ointment  1 application Nasal BID  . prednisoLONE acetate  1 drop Left Eye QID  . sodium chloride  3 mL Intravenous Q12H  . sodium chloride  3 mL Intravenous Q12H   Continuous Infusions:  PRN Meds:.sodium chloride, acetaminophen, albuterol, ondansetron **OR** ondansetron (ZOFRAN) IV, sodium chloride, traZODone Assessment/Plan: Principal Problem:   Pulmonary edema Active Problems:   Hyperlipidemia   TOBACCO USER   Essential hypertension   Type 2 diabetes mellitus with diabetic chronic kidney disease   CKD stage 3 due to type 2 diabetes mellitus   SOB (shortness of breath)   Dyspnea   Pleural effusion   Acute on chronic combined systolic and diastolic CHF (congestive heart failure)   C. difficile diarrhea   Cardiomyopathy   Acute on chronic exacerbation of combined CHF in setting of restrictive cardiomyopathy - Pt with improved dyspnea currently with SpO2 of 96% on [place]. Urine output 350 mL yesterday with 7 lb wt loss since admission. Trigger most likely due to noncompliance with medical therapy. Cause of restrictive disease unclear. 2D-echo with  worsening EF from 55% in 2011 to 20-25% with mild LVH, diffuse hypokinesis, and diastolic dysfunction. Pt has ICD in place.    -Continue oxygen therapy to keep SpO2 >92% -Continue home PO lasix 40 mg daily  -Conntinue daily weights and strict I & O's -Continue monitoring BMP, repeat BNP in AM -Continue tessalon 200 mg BID PRN for cough and albuterol nebulizer Q 6 hr PRN  -F/u respiratory panel panel and cultures (PCT 0.10) -Continue to monitor on telemetry -Continue imdur 60 mg daily and carvedilol 12.5 mg BID    -Avoid CCB in setting of reduced EF -Consider  starting lisinopril 2.5 mg daily due to CrCl <30 (max 40 mg daily) once renal function stable  -CM following, pt states she will not be able to afford medications until she receives monthly check on Feb 15th.  -Awaiting PT consult  -Cardiology to see patient today regarding long-term management and ICD interrogation   Non-oliguric AKI on CKD Stage 3 - Cr improved from 2.22 to 2.17, up from 1.6. Urine output 352 mL yesterday. FeNa 3.7% suggesting possible ischemic ATN from cardiorenal etiology. UA on 11/02/14 with mild proteinuria.   -Continue home PO lasix 40 mg daily  -Continue daily weights and strict I & O's -Avoid nephrotoxins -Pt follows with [facility] (Dr. Lorrene Reid) -Consulted with Dr. Florene Glen ([place]) who recommended continuing current therapy  1st episode of non-severe C diff diarrhea - Improved with non-bloody stools.  -Continue PO flagyl 500 mg TID Day 2/14  -Obtain FOBT -Will need CM to help with obtaining medications  Acute Anemia - Hg stable at 10.1, down from 12.7 on admission.  -Obtain FOBT in setting of c diff diarrhea -Continue to monitor CBC  Hypertension - Currently hypertensive 153/72 -Continue imdur 60 mg daily and consider increasing carvedilol 12.5 mg BID to 25 mg BID  -Consider starting lisinopril 2.5 mg daily due to CrCl <30 (max 40 mg daily) once renal function stable  -Continue  home PO  lasix 40 mg daily   CAD - Currently with no anginal pain. Last cath in 2005 with mild obstructive CAD. -Continue newly started aspirin 81 mg daily  -Continue imdur 60 mg daily and carvedilol 12.5 mg BID   -Consider starting lisinopril 2.5 mg daily due to CrCl <30 (max 40 mg daily) once renal function stable  -Cardiology to see patient today   Non-insulin dependant Type 2 DM - Last CBG 114. Last A1c 5.1 on 11/02/14 not on medical therapy. -SSI was discontinued due to symptomatic hypoglycemia today  -Continue gabapentin 400 mg TID for peripheral neuropathy  Insomnia - Currently stable -Trazadone 50 mg PRN sleep  Diet: Heart/carb modified DVT Ppx: SQ Heparin TID Code: Full   Dispo: Disposition is deferred at this time, awaiting improvement of current medical problems.  Anticipated discharge in approximately 1-3 day(s).   The patient does have a current PCP Julious Oka, MD) and does need an [facility] hospital follow-up appointment after discharge.  The patient does not have transportation limitations that hinder transportation to clinic appointments.  .Services Needed at time of discharge: Y = Yes, Blank = No PT:   OT:   RN:   Equipment:   Other:     LOS: 3 days   Juluis Mire, MD 11/05/2014, 1:07 PM
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
## 2014-11-05 NOTE — Progress Notes (Signed)
PT Cancellation Note  Patient Details Name: [name] [name] MRN: [id number] DOB: 08/13/1951   Cancelled Treatment:    Reason Eval/Treat Not Completed: PT screened, no needs identified, will sign off -- Pt has been transferring to w/c and rolling in hallways, denies changes in physical abilities and decline PT at this time.  Pt most adamantly requests home aide to assist with household needs, and states she can't go home until she gets her check to pay bills on 2/15.  Did assure pt that she would likely be d/c'd once medically stable and recommend CSW consult to assess for support needs at d/c.  No further PT needs and signing off, thanks.   [facility] 11/05/2014, 2:24 PM

## 2014-11-06 ENCOUNTER — Inpatient Hospital Stay (HOSPITAL_COMMUNITY): Payer: Medicaid Other

## 2014-11-06 DIAGNOSIS — R404 Transient alteration of awareness: Secondary | ICD-10-CM

## 2014-11-06 DIAGNOSIS — R4182 Altered mental status, unspecified: Secondary | ICD-10-CM

## 2014-11-06 LAB — BASIC METABOLIC PANEL
Anion gap: 3 — ABNORMAL LOW (ref 5–15)
BUN: 43 mg/dL — ABNORMAL HIGH (ref 6–23)
CO2: 28 mmol/L (ref 19–32)
Calcium: 8.6 mg/dL (ref 8.4–10.5)
Chloride: 106 mmol/L (ref 96–112)
Creatinine, Ser: 2.51 mg/dL — ABNORMAL HIGH (ref 0.50–1.10)
GFR calc non Af Amer: 20 mL/min — ABNORMAL LOW (ref >90)
GFR, EST AFRICAN AMERICAN: 23 mL/min — AB (ref >90)
Glucose, Bld: 102 mg/dL — ABNORMAL HIGH (ref 70–99)
Potassium: 4.3 mmol/L (ref 3.5–5.1)
Sodium: 137 mmol/L (ref 135–145)

## 2014-11-06 LAB — CBC
HCT: 32.1 % — ABNORMAL LOW (ref 36.0–46.0)
Hemoglobin: 10.3 g/dL — ABNORMAL LOW (ref 12.0–15.0)
MCH: 29.4 pg (ref 26.0–34.0)
MCHC: 32.1 g/dL (ref 30.0–36.0)
MCV: 91.7 fL (ref 78.0–100.0)
Platelets: 129 10*3/uL — ABNORMAL LOW (ref 150–400)
RBC: 3.5 MIL/uL — AB (ref 3.87–5.11)
RDW: 14.2 % (ref 11.5–15.5)
WBC: 4.1 10*3/uL (ref 4.0–10.5)

## 2014-11-06 LAB — GLUCOSE, CAPILLARY: Glucose-Capillary: 87 mg/dL (ref 70–99)

## 2014-11-06 LAB — BRAIN NATRIURETIC PEPTIDE: B NATRIURETIC PEPTIDE 5: 626.1 pg/mL — AB (ref 0.0–100.0)

## 2014-11-06 MED ORDER — SODIUM CHLORIDE 0.9 % IV SOLN: INTRAVENOUS | Status: DC

## 2014-11-06 MED ORDER — DEXTROSE 50 % IV SOLN
1.0000 | Freq: Once | INTRAVENOUS | Status: AC
  Administered 2014-11-06: 50 mL via INTRAVENOUS
  Filled 2014-11-06: qty 50

## 2014-11-06 MED ORDER — DEXTROSE 50 % IV SOLN
INTRAVENOUS | Status: AC
  Filled 2014-11-06: qty 50

## 2014-11-06 MED ORDER — FUROSEMIDE 40 MG PO TABS
40.0000 mg | ORAL_TABLET | Freq: Every day | ORAL | Status: DC
  Administered 2014-11-06 – 2014-11-08 (×2): 40 mg via ORAL
  Filled 2014-11-06 (×3): qty 1

## 2014-11-06 MED ORDER — REGADENOSON 0.4 MG/5ML IV SOLN
0.4000 mg | Freq: Once | INTRAVENOUS | Status: DC
  Filled 2014-11-06: qty 5

## 2014-11-06 MED ORDER — CARVEDILOL 12.5 MG PO TABS
12.5000 mg | ORAL_TABLET | Freq: Two times a day (BID) | ORAL | Status: DC
  Administered 2014-11-06 – 2014-11-08 (×4): 12.5 mg via ORAL
  Filled 2014-11-06 (×6): qty 1

## 2014-11-06 MED ORDER — HYDRALAZINE HCL 10 MG PO TABS
10.0000 mg | ORAL_TABLET | Freq: Three times a day (TID) | ORAL | Status: DC
  Administered 2014-11-06 – 2014-11-08 (×6): 10 mg via ORAL
  Filled 2014-11-06 (×9): qty 1

## 2014-11-06 NOTE — Progress Notes (Signed)
Subjective:    The patient was nothing by mouth for Myoview this morning. The patient was alert and oriented with no complaints at 8:15 this morning. Upon return to round with the team at 9:15, the patient was very lethargic and difficult to arouse. She initially had difficulty moving her extremities, which seemed more pronounced on the left side. She had trouble speaking and forming her words.  Interval Events: -Capillary blood glucose 82. Given 1 amp of D50. -Code stroke called.  -Head CT negative for acute abnormalities. -Patient improved after receiving D50 and with time.   Objective:    Vital Signs:   Temp:  [97.4 F (36.3 C)-98.3 F (36.8 C)] 97.4 F (36.3 C) (02/08 1041) Pulse Rate:  [59-64] 63 (02/08 1041) Resp:  [16-20] 18 (02/08 1041) BP: (110-155)/(59-75) 144/64 mmHg (02/08 1041) SpO2:  [94 %-100 %] 100 % (02/08 1041) Weight:  [120 lb 8 oz (54.658 kg)] 120 lb 8 oz (54.658 kg) (02/08 0403) Last BM Date: 11/05/14  24-hour weight change: Weight change: 1 lb 0.2 oz (0.46 kg)  Intake/Output:   Intake/Output Summary (Last 24 hours) at 11/06/14 1158 Last data filed at 11/06/14 1122  Gross per 24 hour  Intake    806 ml  Output   2900 ml  Net  -2094 ml      Physical Exam: General: Patient very drowsy and difficult to arouse. Trouble with word finding.   Lungs:  Normal respiratory effort. Clear to auscultation BL without crackles or wheezes.  Heart: RRR. S1 and S2 normal without gallop, murmur, or rubs.  Abdomen:  BS normoactive. Soft, Nondistended, non-tender.  No masses or organomegaly.  Extremities: No pretibial edema. Difficulty moving extremities. Initially, not responding to commands with apparent weakness. Improved to 4 out of 5 strength over next 5-10 minutes.      Labs:  Basic Metabolic Panel:  Recent Labs Lab 11/03/14 0524 11/03/14 1845 11/04/14 0351 11/05/14 0508 11/06/14 0520  NA 140 139 139 141 137  K 3.9 4.0 4.0 3.5 4.3  CL 109 107 107  109 106  CO2 24 24 23 28 28   GLUCOSE 152* 120* 83 85 102*  BUN 19 20 26* 34* 43*  CREATININE 1.76* 1.93* 2.22* 2.17* 2.51*  CALCIUM 8.4 9.0 8.5 8.4 8.6  MG  --   --  1.7 1.8  --    CBC:  Recent Labs Lab 11/02/14 1019 11/04/14 0351 11/05/14 0508 11/06/14 0520  WBC 4.2 5.8 5.5 4.1  HGB 12.7 10.5* 10.1* 10.3*  HCT 37.5 32.7* 30.7* 32.1*  MCV 87.8 92.1 89.5 91.7  PLT 165 144* 134* 129*    Cardiac Enzymes:  Recent Labs Lab 11/02/14 1920 11/02/14 2230 11/03/14 0524  TROPONINI <0.03 <0.03 <0.03    BNP: Invalid input(s): POCBNP  CBG:  Recent Labs Lab 11/05/14 0749 11/05/14 1213 11/05/14 1654 11/05/14 2100 11/06/14 0554  GLUCAP 88 114* 106* 154* 46    Microbiology: Results for orders placed or performed during the hospital encounter of 11/02/14  MRSA PCR Screening     Status: Abnormal   Collection Time: 11/02/14  7:20 PM  Result Value Ref Range Status   MRSA by PCR POSITIVE (A) NEGATIVE Final    Comment:        The GeneXpert MRSA Assay (FDA approved for NASAL specimens only), is one component of a comprehensive MRSA colonization surveillance program. It is not intended to diagnose MRSA infection nor to guide or monitor treatment for MRSA infections. RESULT CALLED TO,  READ BACK BY AND VERIFIED WITHRenard Hamper RN [id number] 11/02/14 A BROWNING   Culture, expectorated sputum-assessment     Status: None   Collection Time: 11/03/14  2:35 PM  Result Value Ref Range Status   Specimen Description SPUTUM  Final   Special Requests NONE  Final   Sputum evaluation   Final    THIS SPECIMEN IS ACCEPTABLE. RESPIRATORY CULTURE REPORT TO FOLLOW.   Report Status 11/03/2014 FINAL  Final  Culture, respiratory (NON-Expectorated)     Status: None   Collection Time: 11/03/14  2:35 PM  Result Value Ref Range Status   Specimen Description SPUTUM  Final   Special Requests NONE  Final   Gram Stain   Final    FEW WBC PRESENT,BOTH PMN AND MONONUCLEAR RARE SQUAMOUS EPITHELIAL CELLS  PRESENT FEW GRAM POSITIVE COCCI IN PAIRS IN CLUSTERS RARE GRAM NEGATIVE RODS Performed at [facility]    Culture   Final    NORMAL OROPHARYNGEAL FLORA Performed at [facility]    Report Status 11/05/2014 FINAL  Final  Clostridium Difficile by PCR     Status: Abnormal   Collection Time: 11/03/14  9:48 PM  Result Value Ref Range Status   C difficile by pcr POSITIVE (A) NEGATIVE Final    Comment: CRITICAL RESULT CALLED TO, READ BACK BY AND VERIFIED WITH: VASIADIS,A RN 11/03/14 1119 WOOTEN,K      Imaging: Ct Head Wo Contrast  11/06/2014   CLINICAL DATA:  Patient unable to speak for 2-3 hr.  EXAM: CT HEAD WITHOUT CONTRAST  TECHNIQUE: Contiguous axial images were obtained from the base of the skull through the vertex without intravenous contrast.  COMPARISON:  Head CT scan 06/18/2009 and brain MRI 10/30/2009.  FINDINGS: Hypoattenuation in the subcortical and periventricular deep white matter is compatible with chronic microvascular ischemic change. No evidence of acute abnormality including hemorrhage, infarct, mass lesion, mass effect, midline shift or abnormal extra-axial fluid collection is identified. No hydrocephalus or pneumocephalus. The calvarium is intact. Carotid atherosclerosis noted.  IMPRESSION: No acute abnormality.  Chronic microvascular ischemic change.  Atherosclerosis.  Critical Value/emergent results were called by telephone at the time of interpretation on 11/06/2014 at 10:16 am to Dr. Larey Dresser , who verbally acknowledged these results.   Electronically Signed   By: Inge Rise M.D.   On: 11/06/2014 10:16       Medications:    Infusions: . sodium chloride      Scheduled Medications: . antiseptic oral rinse  7 mL Mouth Rinse q12n4p  . aspirin  81 mg Oral Daily  . chlorhexidine  15 mL Mouth Rinse BID  . Chlorhexidine Gluconate Cloth  6 each Topical Q0600  . furosemide  40 mg Oral Daily  . gabapentin  400 mg Oral TID  . heparin  5,000  Units Subcutaneous 3 times per day  . isosorbide mononitrate  60 mg Oral Daily  . latanoprost  1 drop Both Eyes QHS  . metroNIDAZOLE  500 mg Oral 3 times per day  . mupirocin ointment  1 application Nasal BID  . prednisoLONE acetate  1 drop Left Eye QID  . regadenoson  0.4 mg Intravenous Once  . sodium chloride  3 mL Intravenous Q12H  . sodium chloride  3 mL Intravenous Q12H    PRN Medications: sodium chloride, acetaminophen, albuterol, benzonatate, ondansetron **OR** ondansetron (ZOFRAN) IV, sodium chloride, traZODone   Assessment/ Plan:    Principal Problem:   Pulmonary edema Active Problems:   Hyperlipidemia  TOBACCO USER   Essential hypertension   Type 2 diabetes mellitus with diabetic chronic kidney disease   CKD stage 3 due to type 2 diabetes mellitus   SOB (shortness of breath)   Dyspnea   Pleural effusion   Acute on chronic combined systolic and diastolic CHF (congestive heart failure)   C. difficile diarrhea   Cardiomyopathy  #Altered mental status  Head CT normal with resolution of symptoms over 20-30 minutes. Cannot exclude TIA, but not typical. No offending medications. She has had trazodone the past 3 nights without issues. Symptoms did appear to improve with an amp of D50, but this would be atypical with the capillary blood glucose of 82. Neurology saw the patient and felt symptoms would be atypical for TIA. They recommend checking an A1c, fasting lipid panel, and carotid Dopplers. In addition, they recommended checking an ammonia, TSH, and B-12. Hemoglobin A1c was 5.1 on February 4 with normal TSH. -Check fasting lipid panel, ammonia, and B-12 tomorrow. -Check carotid Dopplers. -Continue aspirin 81 mg daily.   #Acute exacerbation of combined CHF in setting of restrictive cardiomyopathy  Shortness of breath was improved this morning prior to altered mental status. Good urine output yesterday. Weight up but the method of measurement may be different. Plan was for  Myoview today. Creatinine elevated this morning, suggesting overdiuresis. BNP improving today. -Stop Lasix 40 mg IV daily. Consider restarting by mouth. -Strict ins and outs. -Continue Tessalon as needed for cough and albuterol as needed. -Follow-up respiratory panel. -Continue to monitor on telemetry. -Hold blood pressure meds this morning due to concern for possible stroke. -Consider starting lisinopril once renal function stable. -No needs based on PT evaluation. -Reschedule Myoview for tomorrow.  #Acute on chronic kidney disease Creatinine up to 2.51 today from 2.17 yesterday. Most likely due to overdiuresis. -Stop IV Lasix. -Monitor BMP.   #C. difficile diarrhea  Diarrhea improved today.  Etiology unclear. -Continue by mouth Flagyl 500 mg 3 times a day, day 3 of 14. -Follow-up FOBT.  #Acute anemia Stable from yesterday. -Continue to monitor CBC.  #Coronary artery disease No signs of bleeding since starting aspirin. -Continue aspirin 81 mg daily. -Follow-up results of Myoview.  #Type 2 diabetes Sliding scale insulin was discontinued yesterday due to symptomatic hypoglycemia. Unclear if this could potentially be contributing to her altered mental status. -Continue to hold insulin.  -Continue gabapentin 400 mg 3 times a day.   #Insomnia Trazodone unlikely to because of altered mental status. She received this last night and was fine this morning without issues. -Continue trazodone 50 mg as needed for sleep.    DVT PPX - heparin  CODE STATUS -  full   CONSULTS PLACED - Neurology, cardiology.  Disposition is deferred at this time, awaiting improvement of congestive heart failure   Anticipated discharge in approximately 2-3 day(s).   The patient does have a current PCP Hulen Luster, Beverlee Nims, MD) and does need an [facility] hospital follow-up appointment after discharge.    Is the [facility] hospital follow-up appointment a one-time only appointment? no.  Does the patient have  transportation limitations that hinder transportation to clinic appointments? yes   SERVICE NEEDED AT [place]         Y = Yes, Blank = No PT:   OT:   RN:   Equipment:   Other:      Length of Stay: 4 day(s)   Signed: Arman Filter, MD  PGY-1, Internal Medicine Resident Pager: [phone] (7AM-5PM)  11/06/2014, 11:58 AM

## 2014-11-06 NOTE — Progress Notes (Signed)
Encouraged patient to call before she attempts to get out of bed for assistance. Placed bed alarm on for safety however patient is refusing bed alarm. Encouraged patient that is was for safety purposes but patient still refused.

## 2014-11-06 NOTE — Evaluation (Signed)
Occupational Therapy Evaluation and Discharge Patient Details Name: [name] [name] MRN: [id number] DOB: 02/03/1951 Today's Date: 11/06/2014    History of Present Illness [name] [name] is a 64 year old woman with history of type 2 diabetes with chronic kidney disease, hypertension, left below-knee amputation, coronary artery disease, and sinus arrest status post pacemaker placement who presents with shortness of breath. 11/06/14 had an episode this AM where code stroke with activated, CT negative and pt returned to her baseline shortly   Clinical Impression   This 64 yo female admitted with above presents to acute OT at a Mod I level at W/C level for BADLs, no further OT needs, we will sign off.    Follow Up Recommendations  No OT follow up    Equipment Recommendations  None recommended by OT       Precautions / Restrictions Precautions Precautions: Fall Precaution Comments: Due to LLE BKA and pt reports she has fallen twice where her prothstic "let go" while she was walking due to it has gotten too loose lately due to loss of pt's loss of weight--she says she knows she needs to go to [employer] to get them to look at it, but hasn't. Restrictions Weight Bearing Restrictions: No      Mobility Bed Mobility Overal bed mobility: Modified Independent                Transfers Overall transfer level: Modified independent   Transfers: Squat Pivot Transfers     Squat pivot transfers: Modified independent (Device/Increase time)     General transfer comment: going left>right and right>left    Balance Overall balance assessment: History of Falls                                          ADL Overall ADL's : Modified independent                                       General ADL Comments: from a W/C level for BADLs-pt does not have her prothestic here so can't say from that standpoint--nor can I get [employer] to come and look at it.              Pertinent Vitals/Pain Pain Assessment: No/denies pain     Hand Dominance Right   Extremity/Trunk Assessment Upper Extremity Assessment Upper Extremity Assessment: Overall WFL for tasks assessed           Communication Communication Communication: No difficulties   Cognition Arousal/Alertness: Awake/alert Behavior During Therapy: WFL for tasks assessed/performed Overall Cognitive Status: Within Functional Limits for tasks assessed                                Home Living Family/patient expects to be discharged to:: Private residence Living Arrangements: Alone Available Help at Discharge: Personal care attendant (varies throughout the week) Type of Home: Apartment Home Access: Level entry     Home Layout: One level     Bathroom Shower/Tub: Tub/shower unit;Curtain Shower/tub characteristics: Architectural technologist: Standard Bathroom Accessibility: Yes How Accessible: Accessible via walker Home Equipment: [place] - 2 wheels;Bedside commode;Hand held shower head;Tub bench;Wheelchair - manual          Prior Functioning/Environment Level of Independence: Needs assistance  ADL's / Homemaking Assistance Needed: Has intermittent A from [facility] for IADLs        OT Diagnosis: Generalized weakness         OT Goals(Current goals can be found in the care plan section) Acute Rehab OT Goals Patient Stated Goal: get more help at home, someone to stay her at night  OT Frequency:                End of Session Equipment Utilized During Treatment:  (none)  Activity Tolerance: Patient tolerated treatment well Patient left:  (sitting EOB)   TimeKX:[id number] OT Time Calculation (min): 19 min Charges:  OT General Charges $OT Visit: 1 Procedure OT Evaluation $Initial OT Evaluation Tier I: 1 Procedure  Almon Register [id number] 11/06/2014, 4:14 PM

## 2014-11-06 NOTE — Progress Notes (Addendum)
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 difficile diarrhea   Cardiomyopathy    SUBJECTIVE  CODE stroke called earlier this AM for garbled speech. Now back to baseline and seen in bed. EAting. Very chatty. No CP> SOB improving. Has a slight cough.   CURRENT MEDS . antiseptic oral rinse  7 mL Mouth Rinse q12n4p  . aspirin  81 mg Oral Daily  . chlorhexidine  15 mL Mouth Rinse BID  . Chlorhexidine Gluconate Cloth  6 each Topical Q0600  . dextrose  1 ampule Intravenous Once  . gabapentin  400 mg Oral TID  . heparin  5,000 Units Subcutaneous 3 times per day  . isosorbide mononitrate  60 mg Oral Daily  . latanoprost  1 drop Both Eyes QHS  . metroNIDAZOLE  500 mg Oral 3 times per day  . mupirocin ointment  1 application Nasal BID  . prednisoLONE acetate  1 drop Left Eye QID  . sodium chloride  3 mL Intravenous Q12H  . sodium chloride  3 mL Intravenous Q12H    OBJECTIVE  Filed Vitals:   11/05/14 2101 11/05/14 2124 11/06/14 0403 11/06/14 0928  BP: 110/59  130/59 155/75  Pulse: 59 60 61 63  Temp: 98.1 F (36.7 C)  97.4 F (36.3 C) 97.6 F (36.4 C)  TempSrc: Oral  Oral Oral  Resp: 18  16 20   Height:      Weight:   120 lb 8 oz (54.658 kg)   SpO2: 100%  100% 100%    Intake/Output Summary (Last 24 hours) at 11/06/14 0939 Last data filed at 11/06/14 0906  Gross per 24 hour  Intake    900 ml  Output   2200 ml  Net  -1300 ml   Filed Weights   11/04/14 0709 11/05/14 0701 11/06/14 0403  Weight: 115 lb 6.2 oz (52.34 kg) 116 lb 6.5 oz (52.8 kg) 120 lb 8 oz (54.658 kg)    PHYSICAL  EXAM  General: Well developed, well nourished, in no acute distress Head: Eyes PERRLA, No xanthomas. Normal cephalic and atramatic Lungs: diffuse wheezing. Decreased breath sounds at bases Heart: HRRR S1 S2 Pulses are 2+ & equal.  No carotid bruit. No JVD. No abdominal bruits. No femoral bruits. Abdomen: Bowel sounds are positive, abdomen soft and non-tender without masses  Extremities: No clubbing, cyanosis or edema. DP +1 S/p L BKA Neuro: Alert and oriented X 3. Psych: Good affect, responds appropriately Accessory Clinical Findings  CBC  Recent Labs  11/05/14 0508 11/06/14 0520  WBC 5.5 4.1  HGB 10.1* 10.3*  HCT 30.7* 32.1*  MCV 89.5 91.7  PLT 134* Q000111Q*   Basic Metabolic Panel  Recent Labs  11/04/14 0351 11/05/14 0508 11/06/14 0520  NA 139 141 137  K 4.0 3.5 4.3  CL 107 109 106  CO2 23 28 28   GLUCOSE 83 85 102*  BUN 26* 34* 43*  CREATININE 2.22* 2.17* 2.51*  CALCIUM 8.5 8.4 8.6  MG 1.7 1.8  --     Radiology/Studies  Dg Neck Soft Tissue  11/02/2014   CLINICAL DATA:  Stridor.  Respiratory distress and cough.  EXAM: NECK SOFT TISSUES - 1+ VIEW  COMPARISON:  None.  FINDINGS: There is no evidence of retropharyngeal soft tissue swelling or epiglottic enlargement. The cervical airway is unremarkable and no radio-opaque foreign body identified.  IMPRESSION: Negative exam.   Electronically Signed   By: Inge Rise M.D.   On: 11/02/2014 17:05   Dg Chest 2 View  11/03/2014   CLINICAL DATA:  Shortness of breath.  EXAM: CHEST  2 VIEW  COMPARISON:  November 02, 2014.  FINDINGS: Stable cardiomegaly. Left-sided pacemaker is unchanged in position. No pneumothorax is noted. Degenerative change of left glenohumeral joint is noted. Stable mild central pulmonary vascular congestion is noted with stable bibasilar interstitial densities most consistent with pulmonary edema. Mild bilateral pleural effusions are noted as well.  IMPRESSION: Stable findings  consistent with congestive heart failure.   Electronically Signed   By: Sabino Dick M.D.   On: 11/03/2014 09:12   Dg Chest Port 1 View  11/02/2014   CLINICAL DATA:  Shortness of breath, nausea, sore throat  EXAM: PORTABLE CHEST - 1 VIEW  COMPARISON:  12/09/2012  FINDINGS: Stable orientation of dual-chamber pacer leads from the left.  Mild cardiomegaly is unchanged. Fullness of the hila which is new and likely congestion. There is interstitial coarsening and haziness of the lower chest compatible with pleural effusions.  Posttraumatic deformity of the left proximal humerus.  IMPRESSION: Mild CHF.   Electronically Signed   By: Jorje Guild M.D.   On: 11/02/2014 11:09    ASSESSMENT AND PLAN  [name] [name] is a 64 year old woman with history of DM, CKD, PVD s/p bilateral fempop bypass, HTN, L BKA, nonobstructive CAD, and sinus arrest s/p PPM who presented to [facility] on 11/05/14 with shortness of breath. On admission BNP >4500 and chest xray c/w mild CHF. 2D ECHO with EF 20-25% and cardiology is now asked to consult.  Acute on chronic combined systolic/diastolic CHF/ Pulm HTN - 2D echo now with severely reduced LVF EF 20% and restrictive physiology which is new from echo 2011. She has mild RV enlargement and severe pulmonary HTN as well. -- Unclear etiology of worsening LVF ? Progression of CAD vs. Viral CM.  -- Agree with nitrates/BB. Avoid ACE I in setting of renal failure. Started on Hydralazine 10mg  TID for afterload reduction. Holding addition of aldactone at this time due to worsening renal function.  -- Noninvasive workup with nuclear stress test prior to discharge to rule out ischemia given drop in EF. Would only pursue left heart cath if large ischemic burden given underlying renal insuff but may consider right heart cath given degree of pulmonary HTN.NUCLEAR STRESS TEST WAS SCHEDULED FOR TODAY  BUT THEN CODE STROKE CALLED, WHICH ENDED UP BEING A FALSE ALARM. WILL RESCHEDULE NUC FOR TOMORROW.   -- Given multiple doses of IV Lasix. Net positive +954 L. However weight down 3lbs. Creat slightly bumped. Would convert to PO today. Will resume her home dosing of 40 po qd.    AMS this AM- rapid response called. Head CT completed, imaging reviewed and was unremarkable. Patient clinically improved as exam went on. Upon return from CT noted to be at her baseline with NIHSS of 0.   Mild nonobstructive ASCAD by cath 2005 currently with no angina  Non-oliguric AKI on CKD stage III. Creat 2.17--> 2.51 today.  C diff diarrhea- per IM  Anemia- H/H 10.3/32.1. per IM  HTN with borderline  control. Continue BB and add Hydralazine 10mg  TID     Judy Pimple PA-C  Pager [phone]  Patient seen and examined and history reviewed. Agree with above findings and plan. Patient feeling well now. Speech is back to normal. Nuclear stress test cancelled this am due to altered speech will reschedule for tomorrow am. She still has wheezes on exam. Will convert to po lasix today. Will continue to monitor renal function closely.  Peter Martinique, [place] 11/06/2014 1:00 PM
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
## 2014-11-06 NOTE — Progress Notes (Signed)
Was asked  by a physician with [facility] ([facility]) this am if I could come to room 3E08. Physicians with [facility] were at the bedside making rounds when they notified me that the patient appears to have had a recent change in mental status and that she appeared lethargic. Was told from physician that last  VS were taken and CBG taken. CBG was 82. Attending MD with IMT[facility] requested CT be done to rule out CVA. Rapid response was called and operator to notify of Code Stroke. Rapid Response came along with stroke team to assess patient further and to take down to CT.  Patient returned back to the unit with rapid response and stroke team MD. Rapid response did stroke swallow screen at the bedside. Patient was A/Ox3 for the remainder of the shift.

## 2014-11-06 NOTE — Code Documentation (Signed)
64yo female admitted for CHF.  Patient seen by resident this morning at 0815 and noted to be at her baseline.  Resident team back to round on patient at 0930 and patient difficult to arouse with garbled speech.  Code stroke activated.  Stroke team to the bedside.  Patient with CBG of 82, medical team ordered D50, given by bedside RN.  Patient initially drowsy with confusion but had increased responsiveness throughout assessment.  Patient transported to CT by stroke team.  CT completed.  Dr. Janann Colonel at the bedside.  Patient back to 3E08.  Patient back to her baseline at that time with NIHSS 0.  TIA alert per Dr. Janann Colonel.  Stroke swallow screen performed and patient passed.  Bedside handoff with bedside RN.

## 2014-11-07 ENCOUNTER — Inpatient Hospital Stay (HOSPITAL_COMMUNITY): Payer: Medicaid Other

## 2014-11-07 DIAGNOSIS — N189 Chronic kidney disease, unspecified: Secondary | ICD-10-CM

## 2014-11-07 DIAGNOSIS — Z7982 Long term (current) use of aspirin: Secondary | ICD-10-CM

## 2014-11-07 DIAGNOSIS — Z794 Long term (current) use of insulin: Secondary | ICD-10-CM

## 2014-11-07 DIAGNOSIS — I425 Other restrictive cardiomyopathy: Secondary | ICD-10-CM

## 2014-11-07 DIAGNOSIS — I509 Heart failure, unspecified: Secondary | ICD-10-CM

## 2014-11-07 DIAGNOSIS — D6489 Other specified anemias: Secondary | ICD-10-CM

## 2014-11-07 LAB — GLUCOSE, CAPILLARY
GLUCOSE-CAPILLARY: 172 mg/dL — AB (ref 70–99)
GLUCOSE-CAPILLARY: 140 mg/dL — AB (ref 70–99)
Glucose-Capillary: 82 mg/dL (ref 70–99)
Glucose-Capillary: 143 mg/dL — ABNORMAL HIGH (ref 70–99)
Glucose-Capillary: 99 mg/dL (ref 70–99)
Glucose-Capillary: 107 mg/dL — ABNORMAL HIGH (ref 70–99)

## 2014-11-07 LAB — CBC
HCT: 30.6 % — ABNORMAL LOW (ref 36.0–46.0)
HEMOGLOBIN: 10.4 g/dL — AB (ref 12.0–15.0)
MCH: 30.4 pg (ref 26.0–34.0)
MCHC: 34 g/dL (ref 30.0–36.0)
MCV: 89.5 fL (ref 78.0–100.0)
PLATELETS: 140 10*3/uL — AB (ref 150–400)
RBC: 3.42 MIL/uL — AB (ref 3.87–5.11)
RDW: 14.4 % (ref 11.5–15.5)
WBC: 5.2 10*3/uL (ref 4.0–10.5)

## 2014-11-07 LAB — BASIC METABOLIC PANEL
Anion gap: 7 (ref 5–15)
BUN: 48 mg/dL — AB (ref 6–23)
CALCIUM: 8.8 mg/dL (ref 8.4–10.5)
CO2: 24 mmol/L (ref 19–32)
CREATININE: 2.55 mg/dL — AB (ref 0.50–1.10)
Chloride: 108 mmol/L (ref 96–112)
GFR, EST AFRICAN AMERICAN: 22 mL/min — AB (ref >90)
GFR, EST NON AFRICAN AMERICAN: 19 mL/min — AB (ref >90)
Glucose, Bld: 98 mg/dL (ref 70–99)
Potassium: 3.8 mmol/L (ref 3.5–5.1)
Sodium: 139 mmol/L (ref 135–145)

## 2014-11-07 LAB — RESPIRATORY VIRUS PANEL
ADENOVIRUS: NEGATIVE
Influenza A: NEGATIVE
Influenza B: NEGATIVE
Metapneumovirus: NEGATIVE
PARAINFLUENZA 2 A: NEGATIVE
Parainfluenza 1: NEGATIVE
Parainfluenza 3: NEGATIVE
RESPIRATORY SYNCYTIAL VIRUS A: NEGATIVE
RESPIRATORY SYNCYTIAL VIRUS B: NEGATIVE
RHINOVIRUS: NEGATIVE

## 2014-11-07 LAB — MAGNESIUM: Magnesium: 1.8 mg/dL (ref 1.5–2.5)

## 2014-11-07 MED ORDER — REGADENOSON 0.4 MG/5ML IV SOLN
INTRAVENOUS | Status: AC
  Filled 2014-11-07: qty 5

## 2014-11-07 MED ORDER — REGADENOSON 0.4 MG/5ML IV SOLN
0.4000 mg | Freq: Once | INTRAVENOUS | Status: AC
  Administered 2014-11-07: 0.4 mg via INTRAVENOUS
  Filled 2014-11-07: qty 5

## 2014-11-07 MED ORDER — TECHNETIUM TC 99M SESTAMIBI GENERIC - CARDIOLITE
30.0000 | Freq: Once | INTRAVENOUS | Status: AC | PRN
  Administered 2014-11-07: 30 via INTRAVENOUS

## 2014-11-07 MED ORDER — TECHNETIUM TC 99M SESTAMIBI GENERIC - CARDIOLITE
10.0000 | Freq: Once | INTRAVENOUS | Status: AC | PRN
  Administered 2014-11-07: 10 via INTRAVENOUS

## 2014-11-07 NOTE — Progress Notes (Signed)
Patient Name: [name] [name] Date of Encounter: 11/07/2014     Principal Problem:   Acute on chronic combined systolic and diastolic CHF (congestive heart failure) Active Problems:   Hyperlipidemia   TOBACCO USER   Essential hypertension   Type 2 diabetes mellitus with diabetic chronic kidney disease   CKD stage 3 due to type 2 diabetes mellitus   SOB (shortness of breath)   Dyspnea   Pulmonary edema   Pleural effusion   C. difficile diarrhea   Cardiomyopathy    SUBJECTIVE  Feels well today, no chest pain or SOB. Still having some diarrhea.  CURRENT MEDS . antiseptic oral rinse  7 mL Mouth Rinse q12n4p  . aspirin  81 mg Oral Daily  . carvedilol  12.5 mg Oral BID WC  . chlorhexidine  15 mL Mouth Rinse BID  . Chlorhexidine Gluconate Cloth  6 each Topical Q0600  . furosemide  40 mg Oral Daily  . gabapentin  400 mg Oral TID  . heparin  5,000 Units Subcutaneous 3 times per day  . hydrALAZINE  10 mg Oral 3 times per day  . isosorbide mononitrate  60 mg Oral Daily  . latanoprost  1 drop Both Eyes QHS  . metroNIDAZOLE  500 mg Oral 3 times per day  . mupirocin ointment  1 application Nasal BID  . prednisoLONE acetate  1 drop Left Eye QID  . regadenoson      . regadenoson      . regadenoson  0.4 mg Intravenous Once  . sodium chloride  3 mL Intravenous Q12H  . sodium chloride  3 mL Intravenous Q12H    OBJECTIVE  Filed Vitals:   11/07/14 1407 11/07/14 1410 11/07/14 1413 11/07/14 1602  BP: 99/40 97/56 100/39 134/45  Pulse: 60 60 64 63  Temp:    97.8 F (36.6 C)  TempSrc:    Oral  Resp:    18  Height:      Weight:      SpO2:    100%    Intake/Output Summary (Last 24 hours) at 11/07/14 1905 Last data filed at 11/07/14 1815  Gross per 24 hour  Intake    240 ml  Output    600 ml  Net   -360 ml   Filed Weights   11/05/14 0701 11/06/14 0403 11/07/14 0636  Weight: 116 lb 6.5 oz (52.8 kg) 120 lb 8 oz (54.658 kg) 127 lb 13.9 oz (58 kg)    PHYSICAL  EXAM  General: Well developed, well nourished, in no acute distress Head: Eyes PERRLA, No xanthomas. Normal cephalic and atramatic Lungs: diffuse wheezing. Decreased breath sounds at bases Heart: HRRR S1 S2 Pulses are 2+ & equal.  No carotid bruit. No JVD. No abdominal bruits. No femoral bruits. Abdomen: Bowel sounds are positive, abdomen soft and non-tender without masses  Extremities: No clubbing, cyanosis or edema. DP +1 S/p L BKA Neuro: Alert and oriented X 3. Psych: Good affect, responds appropriately Accessory Clinical Findings  CBC  Recent Labs  11/06/14 0520 11/07/14 0420  WBC 4.1 5.2  HGB 10.3* 10.4*  HCT 32.1* 30.6*  MCV 91.7 89.5  PLT 129* [id number]*   Basic Metabolic Panel  Recent Labs  11/05/14 0508 11/06/14 0520 11/07/14 0420  NA 141 137 139  K 3.5 4.3 3.8  CL 109 106 108  CO2 28 28 24   GLUCOSE 85 102* 98  BUN 34* 43* 48*  CREATININE 2.17* 2.51* 2.55*  CALCIUM 8.4 8.6 8.8  MG 1.8  --  1.8    Radiology/Studies  Dg Neck Soft Tissue  11/02/2014   CLINICAL DATA:  Stridor.  Respiratory distress and cough.  EXAM: NECK SOFT TISSUES - 1+ VIEW  COMPARISON:  None.  FINDINGS: There is no evidence of retropharyngeal soft tissue swelling or epiglottic enlargement. The cervical airway is unremarkable and no radio-opaque foreign body identified.  IMPRESSION: Negative exam.   Electronically Signed   By: Inge Rise M.D.   On: 11/02/2014 17:05   Dg Chest 2 View  11/03/2014   CLINICAL DATA:  Shortness of breath.  EXAM: CHEST  2 VIEW  COMPARISON:  November 02, 2014.  FINDINGS: Stable cardiomegaly. Left-sided pacemaker is unchanged in position. No pneumothorax is noted. Degenerative change of left glenohumeral joint is noted. Stable mild central pulmonary vascular congestion is noted with stable bibasilar interstitial densities most consistent with pulmonary edema. Mild bilateral pleural effusions are noted as well.  IMPRESSION: Stable findings  consistent with congestive heart failure.   Electronically Signed   By: Sabino Dick M.D.   On: 11/03/2014 09:12   Ct Head Wo Contrast  11/06/2014   CLINICAL DATA:  Patient unable to speak for 2-3 hr.  EXAM: CT HEAD WITHOUT CONTRAST  TECHNIQUE: Contiguous axial images were obtained from the base of the skull through the vertex without intravenous contrast.  COMPARISON:  Head CT scan 06/18/2009 and brain MRI 10/30/2009.  FINDINGS: Hypoattenuation in the subcortical and periventricular deep white matter is compatible with chronic microvascular ischemic change. No evidence of acute abnormality including hemorrhage, infarct, mass lesion, mass effect, midline shift or abnormal extra-axial fluid collection is identified. No hydrocephalus or pneumocephalus. The calvarium is intact. Carotid atherosclerosis noted.  IMPRESSION: No acute abnormality.  Chronic microvascular ischemic change.  Atherosclerosis.  Critical Value/emergent results were called by telephone at the time of interpretation on 11/06/2014 at 10:16 am to Dr. Larey Dresser , who verbally acknowledged these results.   Electronically Signed   By: Inge Rise M.D.   On: 11/06/2014 10:16   Nm Myocar Multi W/spect W/wall Motion / Ef  11/07/2014   CLINICAL DATA:  CHF  EXAM: MYOCARDIAL IMAGING WITH SPECT (REST AND PHARMACOLOGIC-STRESS)  GATED LEFT VENTRICULAR WALL MOTION STUDY  LEFT VENTRICULAR EJECTION FRACTION  TECHNIQUE: Standard myocardial SPECT imaging was performed after resting intravenous injection of 10 mCi Tc-20m sestamibi. Subsequently, intravenous infusion of Lexiscan was performed under the supervision of the Cardiology staff. At peak effect of the drug, 30 mCi Tc-108m sestamibi was injected intravenously and standard myocardial SPECT imaging was performed. Quantitative gated imaging was also performed to evaluate left ventricular wall motion, and estimate left ventricular ejection fraction.  COMPARISON:  None.  FINDINGS: Baseline EKG showed  NSR with ventricular paced rhythm. During Lexiscan infusion there were no changes from baseline EKG. No complaints of chest pain.  RAW images showed increased gut uptake below the diaphragm.  Perfusion: Small in size, mild in intensity defect involving the anterolateral wall.  Wall Motion: Low normal left ventricular wall motion. No left ventricular dilation.  Left Ventricular Ejection Fraction: 51 %  End diastolic volume 123XX123 ml  End systolic volume 58 ml  IMPRESSION: 1. Small in size, mild in intensity defect involving the mid and distal anterior and anterolateral wall. There is reduced uptake overall in all areas on stress images as compared to rest images so anterior defect could be artifactual.  The polar plot demonstrates this as a very small area of ischemia in the anterolateral wall.  2.  Low normal left ventricular wall motion.  3. Left ventricular ejection fraction 51%  4. Low-risk stress test findings*.  *2012 Appropriate Use Criteria for Coronary Revascularization Focused Update: J Am Coll Cardiol. N6492421. http://content.airportbarriers.com.aspx?articleid=1201161   Electronically Signed   By: Fransico Him   On: 11/07/2014 18:04   Dg Chest Port 1 View  11/02/2014   CLINICAL DATA:  Shortness of breath, nausea, sore throat  EXAM: PORTABLE CHEST - 1 VIEW  COMPARISON:  12/09/2012  FINDINGS: Stable orientation of dual-chamber pacer leads from the left.  Mild cardiomegaly is unchanged. Fullness of the hila which is new and likely congestion. There is interstitial coarsening and haziness of the lower chest compatible with pleural effusions.  Posttraumatic deformity of the left proximal humerus.  IMPRESSION: Mild CHF.   Electronically Signed   By: Jorje Guild M.D.   On: 11/02/2014 11:09    ASSESSMENT AND PLAN  NEYLAN STONG is a 64 year old woman with history of DM, CKD, PVD s/p bilateral fempop bypass, HTN, L BKA, nonobstructive CAD, and sinus arrest s/p PPM who presented to [facility] on  11/05/14 with shortness of breath. On admission BNP >4500 and chest xray c/w mild CHF. 2D ECHO with EF 20-25%   Acute on chronic combined systolic/diastolic CHF/ Pulm HTN - 2D echo on admit showed severely reduced LVF EF 20% and restrictive physiology which is new from echo 2011. She has mild RV enlargement and severe pulmonary HTN as well. -- Myoview today shows ? Minimal anterior ischemia versus artifact. EF 51%. Low risk study. -- Continue with nitrates/BB. Avoid ACE I in setting of renal failure. Started on Hydralazine 10mg  TID for afterload reduction. Continue lasix 40 mg daily.   Mild nonobstructive ASCAD by cath 2005 currently with no angina. Low risk myoview  Non-oliguric AKI on CKD stage III. Creat 2.17--> 2.55 today.  C diff diarrhea- per IM  Anemia- H/H 10.3/32.1. per IM  HTN with excellent control   I personally reviewed her Echo and Nuclear studies. By Echo on 2/5 her EF was significantly reduced- probably about 30%. By nuclear study EF improved to 51%. I wonder whether her infection was playing a role or perhaps uncontrolled HTN. At any rate she is much better and we can continue medical therapy without further cardiac workup.  Peter Martinique, [place] 11/07/2014 7:05 PM

## 2014-11-07 NOTE — Progress Notes (Signed)
Subjective:    Went to see [name] this afternoon in Nuclear Medicine where she was receiving her Myoview.  She reports doing well today without any complaints.  She denies shortness of breath or chest pain currently.  She also denies any numbness, weakness, or trouble speaking.  Interval Events: -Vital signs stable. -Creatinine slightly increased from yesterday.   Objective:    Vital Signs:   Temp:  [97.3 F (36.3 C)-97.4 F (36.3 C)] 97.3 F (36.3 C) (02/09 0636) Pulse Rate:  [60-72] 64 (02/09 1413) Resp:  [18] 18 (02/09 0636) BP: (97-139)/(39-69) 100/39 mmHg (02/09 1413) SpO2:  [100 %] 100 % (02/09 0636) Weight:  [127 lb 13.9 oz (58 kg)] 127 lb 13.9 oz (58 kg) (02/09 0636) Last BM Date: 11/05/14  24-hour weight change: Weight change: 11 lb 7.4 oz (5.2 kg)  Intake/Output:   Intake/Output Summary (Last 24 hours) at 11/07/14 1540 Last data filed at 11/07/14 1336  Gross per 24 hour  Intake    480 ml  Output    600 ml  Net   -120 ml      Physical Exam: General: Alert and oriented, in no acute distress.  Lungs:  Normal respiratory effort. Clear to auscultation BL without crackles or wheezes.  Heart: RRR. S1 and S2 normal without gallop, murmur, or rubs.  Abdomen:  BS normoactive. Soft, Nondistended, non-tender.  No masses or organomegaly.  Extremities: No pretibial edema.     Labs:  Basic Metabolic Panel:  Recent Labs Lab 11/03/14 1845 11/04/14 0351 11/05/14 0508 11/06/14 0520 11/07/14 0420  NA 139 139 141 137 139  K 4.0 4.0 3.5 4.3 3.8  CL 107 107 109 106 108  CO2 24 23 28 28 24   GLUCOSE 120* 83 85 102* 98  BUN 20 26* 34* 43* 48*  CREATININE 1.93* 2.22* 2.17* 2.51* 2.55*  CALCIUM 9.0 8.5 8.4 8.6 8.8  MG  --  1.7 1.8  --  1.8   CBC:  Recent Labs Lab 11/02/14 1019 11/04/14 0351 11/05/14 0508 11/06/14 0520 11/07/14 0420  WBC 4.2 5.8 5.5 4.1 5.2  HGB 12.7 10.5* 10.1* 10.3* 10.4*  HCT 37.5 32.7* 30.7* 32.1* 30.6*  MCV 87.8 92.1 89.5 91.7  89.5  PLT 165 144* 134* 129* 140*    Cardiac Enzymes:  Recent Labs Lab 11/02/14 1920 11/02/14 2230 11/03/14 0524  TROPONINI <0.03 <0.03 <0.03   CBG:  Recent Labs Lab 11/05/14 0749 11/05/14 1213 11/05/14 1654 11/05/14 2100 11/06/14 0554  GLUCAP 88 114* 106* 154* 42    Microbiology: Results for orders placed or performed during the hospital encounter of 11/02/14  Respiratory virus panel (routine influenza)     Status: None   Collection Time: 11/02/14  6:51 PM  Result Value Ref Range Status   Respiratory Syncytial Virus A Negative Negative Final   Respiratory Syncytial Virus B Negative Negative Final   Influenza A Negative Negative Final   Influenza B Negative Negative Final   Parainfluenza 1 Negative Negative Final   Parainfluenza 2 Negative Negative Final   Parainfluenza 3 Negative Negative Final   Metapneumovirus Negative Negative Final   Rhinovirus Negative Negative Final   Adenovirus Negative Negative Final    Comment: (NOTE) Performed At: [facility] [place], [place] [id number] Lindon Romp MD [id number]   MRSA PCR Screening     Status: Abnormal   Collection Time: 11/02/14  7:20 PM  Result Value Ref Range Status   MRSA by PCR POSITIVE (A)  NEGATIVE Final    Comment:        The GeneXpert MRSA Assay (FDA approved for NASAL specimens only), is one component of a comprehensive MRSA colonization surveillance program. It is not intended to diagnose MRSA infection nor to guide or monitor treatment for MRSA infections. RESULT CALLED TO, READ BACK BY AND VERIFIED WITHRenard Hamper RN 2201 11/02/14 A BROWNING   Culture, expectorated sputum-assessment     Status: None   Collection Time: 11/03/14  2:35 PM  Result Value Ref Range Status   Specimen Description SPUTUM  Final   Special Requests NONE  Final   Sputum evaluation   Final    THIS SPECIMEN IS ACCEPTABLE. RESPIRATORY CULTURE REPORT TO FOLLOW.   Report Status 11/03/2014 FINAL   Final  Culture, respiratory (NON-Expectorated)     Status: None   Collection Time: 11/03/14  2:35 PM  Result Value Ref Range Status   Specimen Description SPUTUM  Final   Special Requests NONE  Final   Gram Stain   Final    FEW WBC PRESENT,BOTH PMN AND MONONUCLEAR RARE SQUAMOUS EPITHELIAL CELLS PRESENT FEW GRAM POSITIVE COCCI IN PAIRS IN CLUSTERS RARE GRAM NEGATIVE RODS Performed at [facility]    Culture   Final    NORMAL OROPHARYNGEAL FLORA Performed at [facility]    Report Status 11/05/2014 FINAL  Final  Clostridium Difficile by PCR     Status: Abnormal   Collection Time: 11/03/14  9:48 PM  Result Value Ref Range Status   C difficile by pcr POSITIVE (A) NEGATIVE Final    Comment: CRITICAL RESULT CALLED TO, READ BACK BY AND VERIFIED WITH: VASIADIS,A RN 11/03/14 1119 WOOTEN,K      Imaging: Ct Head Wo Contrast  11/06/2014   CLINICAL DATA:  Patient unable to speak for 2-3 hr.  EXAM: CT HEAD WITHOUT CONTRAST  TECHNIQUE: Contiguous axial images were obtained from the base of the skull through the vertex without intravenous contrast.  COMPARISON:  Head CT scan 06/18/2009 and brain MRI 10/30/2009.  FINDINGS: Hypoattenuation in the subcortical and periventricular deep white matter is compatible with chronic microvascular ischemic change. No evidence of acute abnormality including hemorrhage, infarct, mass lesion, mass effect, midline shift or abnormal extra-axial fluid collection is identified. No hydrocephalus or pneumocephalus. The calvarium is intact. Carotid atherosclerosis noted.  IMPRESSION: No acute abnormality.  Chronic microvascular ischemic change.  Atherosclerosis.  Critical Value/emergent results were called by telephone at the time of interpretation on 11/06/2014 at 10:16 am to Dr. Larey Dresser , who verbally acknowledged these results.   Electronically Signed   By: Inge Rise M.D.   On: 11/06/2014 10:16       Medications:    Infusions: . sodium  chloride      Scheduled Medications: . antiseptic oral rinse  7 mL Mouth Rinse q12n4p  . aspirin  81 mg Oral Daily  . carvedilol  12.5 mg Oral BID WC  . chlorhexidine  15 mL Mouth Rinse BID  . Chlorhexidine Gluconate Cloth  6 each Topical Q0600  . furosemide  40 mg Oral Daily  . gabapentin  400 mg Oral TID  . heparin  5,000 Units Subcutaneous 3 times per day  . hydrALAZINE  10 mg Oral 3 times per day  . isosorbide mononitrate  60 mg Oral Daily  . latanoprost  1 drop Both Eyes QHS  . metroNIDAZOLE  500 mg Oral 3 times per day  . mupirocin ointment  1 application Nasal BID  .  prednisoLONE acetate  1 drop Left Eye QID  . regadenoson      . regadenoson      . regadenoson  0.4 mg Intravenous Once  . sodium chloride  3 mL Intravenous Q12H  . sodium chloride  3 mL Intravenous Q12H    PRN Medications: sodium chloride, acetaminophen, albuterol, benzonatate, ondansetron **OR** ondansetron (ZOFRAN) IV, sodium chloride, traZODone   Assessment/ Plan:    Principal Problem:   Acute on chronic combined systolic and diastolic CHF (congestive heart failure) Active Problems:   Hyperlipidemia   TOBACCO USER   Essential hypertension   Type 2 diabetes mellitus with diabetic chronic kidney disease   CKD stage 3 due to type 2 diabetes mellitus   SOB (shortness of breath)   Dyspnea   Pulmonary edema   Pleural effusion   C. difficile diarrhea   Cardiomyopathy  #Acute exacerbation of combined CHF in setting of restrictive cardiomyopathy  Shortness of breath improved, continues to have good urine output.  Respiratory virus panel negative.  Myoview today. -Strict ins and outs. -Continue Tessalon as needed for cough and albuterol as needed. -Continue to monitor on telemetry. -Restarted home BP meds. -Consider starting lisinopril once renal function stable. -Follow up Myoview results and cardiology recs. -Stop respiratory precautions.  #Altered mental status  Resolved. Potentially related  to low blood sugar. -Continue to monitor.  #Acute on chronic kidney disease Creatinine stable at 2.55 today from 2.51 yesterday. -Continue Lasix 40 mg PO. Will hold dose tomorrow if Cr continues to rise. -Monitor BMP.   #C. difficile diarrhea  Diarrhea has resolved. -Continue by mouth Flagyl 500 mg 3 times a day, day 4 of 14.  #Acute anemia Stable from yesterday. -Continue to monitor CBC.  #Coronary artery disease No signs of bleeding since starting aspirin. -Continue aspirin 81 mg daily. -Follow-up results of Myoview and cardiology recs.  #Type 2 diabetes Blood sugars stable off of sliding scale. -Continue to hold insulin.  -Continue gabapentin 400 mg 3 times a day.   #Insomnia -Continue trazodone 50 mg as needed for sleep.    DVT PPX - heparin  CODE STATUS -  full   CONSULTS PLACED - Neurology, cardiology.  Disposition is deferred at this time, awaiting improvement of congestive heart failure and CAD work-up.  Anticipated discharge in approximately 1-2 day(s).   The patient does have a current PCP Hulen Luster, Beverlee Nims, MD) and does need an [facility] hospital follow-up appointment after discharge.    Is the [facility] hospital follow-up appointment a one-time only appointment? no.  Does the patient have transportation limitations that hinder transportation to clinic appointments? yes   SERVICE NEEDED AT [facility]         Y = Yes, Blank = No PT:   OT:   RN:   Equipment:   Other:      Length of Stay: 5 day(s)   Signed: Arman Filter, MD  PGY-1, Internal Medicine Resident Pager: [phone] (7AM-5PM) 11/07/2014, 3:40 PM

## 2014-11-07 NOTE — Progress Notes (Signed)
Patient has an order for  Normal saline to run at 100 ml/hr. Pt states that a nurse told her that she did not have to be connected to the IV fluids anymore and is refusing for current nurse to connect her to the IV fluids ordered. Explained to patient the reason of why she may need IV fluids for a couple of reasons and patient still refused. Therefore peripheral IV is saline locked. Will continue to monitor patient to end of shift.

## 2014-11-07 NOTE — Progress Notes (Signed)
SLP Cancellation Note  Patient Details Name: LORISA NAQVI MRN: [id number] DOB: 08/30/1951   Cancelled treatment:       Reason Eval/Treat Not Completed: Patient at procedure or test/unavailable. Attempted to visit pt x3. Will try again tomorrow.   Herbie Baltimore, [place] CCC-SLP [phone]  Lynann Beaver 11/07/2014, 1:29 PM

## 2014-11-07 NOTE — Progress Notes (Signed)
71 Came back from [place] .Denied any untoward incident during the procedure. Kept pt comfortable . Dinner served

## 2014-11-07 NOTE — Progress Notes (Signed)
Found pt eating graham crackers and drank 1/2 can of sprite aftyer instructing pt to be NPO after 12MN. Pt non compliant

## 2014-11-08 DIAGNOSIS — G459 Transient cerebral ischemic attack, unspecified: Secondary | ICD-10-CM

## 2014-11-08 LAB — GLUCOSE, CAPILLARY
GLUCOSE-CAPILLARY: 77 mg/dL (ref 70–99)
GLUCOSE-CAPILLARY: 102 mg/dL — AB (ref 70–99)
Glucose-Capillary: 149 mg/dL — ABNORMAL HIGH (ref 70–99)
Glucose-Capillary: 113 mg/dL — ABNORMAL HIGH (ref 70–99)

## 2014-11-08 LAB — BASIC METABOLIC PANEL
Anion gap: 8 (ref 5–15)
BUN: 45 mg/dL — ABNORMAL HIGH (ref 6–23)
CO2: 24 mmol/L (ref 19–32)
Calcium: 8.5 mg/dL (ref 8.4–10.5)
Chloride: 107 mmol/L (ref 96–112)
Creatinine, Ser: 2.38 mg/dL — ABNORMAL HIGH (ref 0.50–1.10)
GFR calc Af Amer: 24 mL/min — ABNORMAL LOW (ref >90)
GFR calc non Af Amer: 21 mL/min — ABNORMAL LOW (ref >90)
GLUCOSE: 93 mg/dL (ref 70–99)
Potassium: 3.5 mmol/L (ref 3.5–5.1)
SODIUM: 139 mmol/L (ref 135–145)

## 2014-11-08 MED ORDER — ISOSORB DINITRATE-HYDRALAZINE 20-37.5 MG PO TABS: 1.0000 | ORAL_TABLET | Freq: Three times a day (TID) | ORAL | Status: DC

## 2014-11-08 MED ORDER — METRONIDAZOLE 500 MG PO TABS: 500.0000 mg | ORAL_TABLET | Freq: Three times a day (TID) | ORAL | Status: AC

## 2014-11-08 MED ORDER — ATORVASTATIN CALCIUM 40 MG PO TABS
40.0000 mg | ORAL_TABLET | Freq: Every day | ORAL | Status: DC
  Administered 2014-11-08: 40 mg via ORAL
  Filled 2014-11-08: qty 1

## 2014-11-08 MED ORDER — ASPIRIN 81 MG PO CHEW: 81.0000 mg | CHEWABLE_TABLET | Freq: Every day | ORAL | Status: DC

## 2014-11-08 MED ORDER — BENZONATATE 200 MG PO CAPS: 200.0000 mg | ORAL_CAPSULE | Freq: Two times a day (BID) | ORAL | Status: DC | PRN

## 2014-11-08 MED ORDER — ISOSORB DINITRATE-HYDRALAZINE 20-37.5 MG PO TABS
1.0000 | ORAL_TABLET | Freq: Three times a day (TID) | ORAL | Status: DC
  Administered 2014-11-08: 1 via ORAL
  Filled 2014-11-08 (×2): qty 1

## 2014-11-08 MED ORDER — FUROSEMIDE 40 MG PO TABS: 40.0000 mg | ORAL_TABLET | Freq: Every day | ORAL | Status: DC

## 2014-11-08 MED ORDER — ATORVASTATIN CALCIUM 40 MG PO TABS: 40.0000 mg | ORAL_TABLET | Freq: Every day | ORAL | Status: DC

## 2014-11-08 NOTE — Discharge Summary (Signed)
Name: [name] [name] MRN: [id number] DOB: 07/04/1951 64 y.o. PCP: Julious Oka, MD  Date of Admission: 11/02/2014 10:03 AM Date of Discharge: 11/08/2014 Attending Physician: Larey Dresser  Discharge Diagnosis:  Principal Problem:   Acute on chronic combined systolic and diastolic CHF (congestive heart failure) Active Problems:   Hyperlipidemia   TOBACCO USER   Essential hypertension   Type 2 diabetes mellitus with diabetic chronic kidney disease   CKD stage 3 due to type 2 diabetes mellitus   SOB (shortness of breath)   Dyspnea   Pulmonary edema   Pleural effusion   C. difficile diarrhea   Cardiomyopathy  Discharge Medications:   Medication List    STOP taking these medications        diphenoxylate-atropine 2.5-0.025 MG/5ML liquid  Commonly known as:  LOMOTIL     ibuprofen 600 MG tablet  Commonly known as:  ADVIL,MOTRIN     isosorbide mononitrate 60 MG 24 hr tablet  Commonly known as:  IMDUR      TAKE these medications        albuterol 108 (90 BASE) MCG/ACT inhaler  Commonly known as:  PROVENTIL HFA;VENTOLIN HFA  Inhale 2 puffs into the lungs every 6 (six) hours as needed. For wheezing     aspirin 81 MG chewable tablet  Chew 1 tablet (81 mg total) by mouth daily.     atorvastatin 40 MG tablet  Commonly known as:  LIPITOR  Take 1 tablet (40 mg total) by mouth daily at 6 PM.     benzonatate 200 MG capsule  Commonly known as:  TESSALON  Take 1 capsule (200 mg total) by mouth 2 (two) times daily as needed for cough.     carvedilol 12.5 MG tablet  Commonly known as:  COREG  Take 1 tablet (12.5 mg total) by mouth 2 (two) times daily with a meal.     furosemide 40 MG tablet  Commonly known as:  LASIX  Take 1 tablet (40 mg total) by mouth daily.     gabapentin 400 MG capsule  Commonly known as:  NEURONTIN  TAKE ONE CAPSULE BY MOUTH THREE TIMES DAILY     isosorbide-hydrALAZINE 20-37.5 MG per tablet  Commonly known as:  BIDIL  Take 1 tablet by mouth 3  (three) times daily.     metroNIDAZOLE 500 MG tablet  Commonly known as:  FLAGYL  Take 1 tablet (500 mg total) by mouth every 8 (eight) hours.     prednisoLONE acetate 1 % ophthalmic suspension  Commonly known as:  PRED FORTE  Place 1 drop into the left eye 4 (four) times daily.     Travoprost (BAK Free) 0.004 % Soln ophthalmic solution  Commonly known as:  TRAVATAN  Place 1 drop into both eyes at bedtime.     Vitamin D (Ergocalciferol) 50000 UNITS Caps capsule  Commonly known as:  DRISDOL  Take 1 capsule (50,000 Units total) by mouth every 7 (seven) days. Takes on Mondays        Disposition and follow-up:   [name] [name] was discharged from [facility] in Stable condition.  At the hospital follow up visit please address:  1.  Resolution of AKI, improvement of shortness of breath, completion of Cdiff treatment, consider starting ACE inhibitor.  2.  Labs / imaging needed at time of follow-up: BMP.  3.  Pending labs/ test needing follow-up: None.  Follow-up Appointments: Follow-up Information    Follow up with Clinton Gallant, MD On  11/15/2014.   Specialty:  Internal Medicine   Why:  10:15 am   Contact information:   [place] [address] [phone]       Discharge Instructions:  Thank you for allowing [place] to be involved in your healthcare while you were hospitalized at [facility].   Please note that there have been changes to your home medications.  --> PLEASE LOOK AT YOUR DISCHARGE MEDICATION LIST FOR DETAILS.   Please call your PCP if you have any questions or concerns.  Please return to the ER if you have worsening of your symptoms or new severe symptoms arise.  You'll need to continue to take Flagyl 3 times a day for another 9 days to get rid of your diarrhea.  Please let us know if you have any difficulty getting any of your medications.  It'll be important for you to keep up with your blood pressure and  water pills to make sure you don't get short of breath again Discharge Instructions    Call MD for:  difficulty breathing, headache or visual disturbances    Complete by:  As directed      Call MD for:  extreme fatigue    Complete by:  As directed      Call MD for:  persistant dizziness or light-headedness    Complete by:  As directed      Call MD for:  persistant nausea and vomiting    Complete by:  As directed      Call MD for:  redness, tenderness, or signs of infection (pain, swelling, redness, odor or green/yellow discharge around incision site)    Complete by:  As directed      Call MD for:  severe uncontrolled pain    Complete by:  As directed      Call MD for:  temperature >100.4    Complete by:  As directed      Diet - low sodium heart healthy    Complete by:  As directed      Increase activity slowly    Complete by:  As directed            Consultations: Cardiology.  Procedures Performed:  Dg Neck Soft Tissue  11/02/2014   CLINICAL DATA:  Stridor.  Respiratory distress and cough.  EXAM: NECK SOFT TISSUES - 1+ VIEW  COMPARISON:  None.  FINDINGS: There is no evidence of retropharyngeal soft tissue swelling or epiglottic enlargement. The cervical airway is unremarkable and no radio-opaque foreign body identified.  IMPRESSION: Negative exam.   Electronically Signed   By: Inge Rise M.D.   On: 11/02/2014 17:05   Dg Chest 2 View  11/03/2014   CLINICAL DATA:  Shortness of breath.  EXAM: CHEST  2 VIEW  COMPARISON:  November 02, 2014.  FINDINGS: Stable cardiomegaly. Left-sided pacemaker is unchanged in position. No pneumothorax is noted. Degenerative change of left glenohumeral joint is noted. Stable mild central pulmonary vascular congestion is noted with stable bibasilar interstitial densities most consistent with pulmonary edema. Mild bilateral pleural effusions are noted as well.  IMPRESSION: Stable findings consistent with congestive heart failure.   Electronically Signed    By: Sabino Dick M.D.   On: 11/03/2014 09:12   Ct Head Wo Contrast  11/06/2014   CLINICAL DATA:  Patient unable to speak for 2-3 hr.  EXAM: CT HEAD WITHOUT CONTRAST  TECHNIQUE: Contiguous axial images were obtained from the base of the skull through the  vertex without intravenous contrast.  COMPARISON:  Head CT scan 06/18/2009 and brain MRI 10/30/2009.  FINDINGS: Hypoattenuation in the subcortical and periventricular deep white matter is compatible with chronic microvascular ischemic change. No evidence of acute abnormality including hemorrhage, infarct, mass lesion, mass effect, midline shift or abnormal extra-axial fluid collection is identified. No hydrocephalus or pneumocephalus. The calvarium is intact. Carotid atherosclerosis noted.  IMPRESSION: No acute abnormality.  Chronic microvascular ischemic change.  Atherosclerosis.  Critical Value/emergent results were called by telephone at the time of interpretation on 11/06/2014 at 10:16 am to Dr. Larey Dresser , who verbally acknowledged these results.   Electronically Signed   By: Inge Rise M.D.   On: 11/06/2014 10:16   Nm Myocar Multi W/spect W/wall Motion / Ef  11/07/2014   CLINICAL DATA:  CHF  EXAM: MYOCARDIAL IMAGING WITH SPECT (REST AND PHARMACOLOGIC-STRESS)  GATED LEFT VENTRICULAR WALL MOTION STUDY  LEFT VENTRICULAR EJECTION FRACTION  TECHNIQUE: Standard myocardial SPECT imaging was performed after resting intravenous injection of 10 mCi Tc-28m sestamibi. Subsequently, intravenous infusion of Lexiscan was performed under the supervision of the Cardiology staff. At peak effect of the drug, 30 mCi Tc-66m sestamibi was injected intravenously and standard myocardial SPECT imaging was performed. Quantitative gated imaging was also performed to evaluate left ventricular wall motion, and estimate left ventricular ejection fraction.  COMPARISON:  None.  FINDINGS: Baseline EKG showed NSR with ventricular paced rhythm. During Lexiscan infusion there were  no changes from baseline EKG. No complaints of chest pain.  RAW images showed increased gut uptake below the diaphragm.  Perfusion: Small in size, mild in intensity defect involving the anterolateral wall.  Wall Motion: Low normal left ventricular wall motion. No left ventricular dilation.  Left Ventricular Ejection Fraction: 51 %  End diastolic volume 123XX123 ml  End systolic volume 58 ml  IMPRESSION: 1. Small in size, mild in intensity defect involving the mid and distal anterior and anterolateral wall. There is reduced uptake overall in all areas on stress images as compared to rest images so anterior defect could be artifactual.  The polar plot demonstrates this as a very small area of ischemia in the anterolateral wall.  2.  Low normal left ventricular wall motion.  3. Left ventricular ejection fraction 51%  4. Low-risk stress test findings*.  *2012 Appropriate Use Criteria for Coronary Revascularization Focused Update: J Am Coll Cardiol. [id number]. http://content.airportbarriers.com.aspx?articleid=[email or web address]   Electronically Signed   By: Fransico Him   On: 11/07/2014 18:04   Dg Chest Port 1 View  11/02/2014   CLINICAL DATA:  Shortness of breath, nausea, sore throat  EXAM: PORTABLE CHEST - 1 VIEW  COMPARISON:  12/09/2012  FINDINGS: Stable orientation of dual-chamber pacer leads from the left.  Mild cardiomegaly is unchanged. Fullness of the hila which is new and likely congestion. There is interstitial coarsening and haziness of the lower chest compatible with pleural effusions.  Posttraumatic deformity of the left proximal humerus.  IMPRESSION: Mild CHF.   Electronically Signed   By: Jorje Guild M.D.   On: 11/02/2014 11:09    2D Echo:  Study Conclusions  - Left ventricle: The cavity size was normal. Wall thickness was increased in a pattern of mild LVH. Systolic function was severely reduced. The estimated ejection fraction was in the range of 20% to 25%. Diffuse hypokinesis. Doppler  parameters are consistent with restrictive physiology, indicative of decreased left ventricular diastolic compliance and/or increased left atrial pressure. Doppler parameters are consistent with high ventricular filling pressure. -  Mitral valve: There was mild regurgitation. - Left atrium: The atrium was mildly dilated. - Right ventricle: Systolic function was mildly reduced. - Tricuspid valve: There was moderate regurgitation. - Pulmonary arteries: Systolic pressure was severely increased. PA peak pressure: 68 mm Hg (S). - Pericardium, extracardiac: A trivial pericardial effusion was identified.  Impressions:  - Severe global reduction in LV function; restrictive filling; mild LAE; mild MR; mildly reduced RV function; moderate TR; severely elevated pulmonary pressure.  Admission HPI:  [name] [name] is a 64 year old woman with history of type 2 diabetes with chronic kidney disease, hypertension, left below-knee amputation, coronary artery disease, and sinus arrest status post pacemaker placement who presents with shortness of breath. She reports increasing shortness of breath over the last month which has been worse for the past 4 days. She says she ran out of her medications a few days ago, which he thinks may be contributing. She also reports a chronic cough which is occasionally productive of green sputum. She says the shortness of breath is worse when she lays back flat, and she's been sleeping upright at night. She's been trying her home albuterol inhaler without relief. She reports a feeling of tightness in her throat that is associated with some pain. However, she she denies dysphagia or odynophagia. She does report decreased appetite with 6 pound weight loss over the last month. She denies lower extremity edema, chest pain, or chest tightness. She reports some subjective fevers and chills, and reports some intermittent nausea and vomiting most recently yesterday  once.  Hospital Course by problem list: Principal Problem:   Acute on chronic combined systolic and diastolic CHF (congestive heart failure) Active Problems:   Hyperlipidemia   TOBACCO USER   Essential hypertension   Type 2 diabetes mellitus with diabetic chronic kidney disease   CKD stage 3 due to type 2 diabetes mellitus   SOB (shortness of breath)   Dyspnea   Pulmonary edema   Pleural effusion   C. difficile diarrhea   Cardiomyopathy   #Acute on chronic combined CHF Ms. Stockwell has a history of CAD, but she did not have a documented history of CHF, and a previous echocardiogram has shown an EF of 55%.  However, she had been prescribed Lasix 40 mg daily at home.  She presented with shortness of breath with normal saturations, which was initially concerning for upper airway syndrome, but neck x-rays were normal and there was no evidence of airway compromise.  She reported not having taken her medications for several days prior to admission, and she had not been seen by her primary care doctor in over a year.  Her BNP was greater than >4500 on presentation and initial echo showed an EF of 20-25% with global hypokinesis and restrictive physiology.  She was given IV Lasix in the ER with prompt resolution of her symptoms.  Cardiology was consulted and recommended further diuresis and performed a Myoview, which was a low risk study and showed improvement of her EF to 51%.  She was ultimately started back on her home Lasix 40 mg daily and carvedilol 12.5 mg daily.  Hydralazine was added to her home Imdur to reduce afterload, and she was switched to BiDil 20-37.5 mg TID to improve her compliance upon discharge.  She may benefit from starting an ACE inhibitor once her AKI resolves.  #C. difficile diarhea She was noted to have non-bloody, watery diarrhea by the nursing staff and C diff PCR was positive.  She had not been on  any recent antibiotics and had minimal abdominal pain with no systemic sign of  infection. She was started on PO flagyl 500 mg TID for 1st episode of non-severe C diff diarrhea.  She will complete her 14 day course on 11/17/14.  #Altered mental status She had an episode of altered mental status during the hospitalization when she suddenly was very lethargic with difficulty moving her extremities.  She appeared to have trouble forming words.  Her CBG was 82, so she was given an amp of D50, and a code stroke was called.  Head CT without contrast was negative for acute changes, and she appeared to respond to the D50 and returned to her baseline.  She did not have any additional episodes during the hospitalization.  The etiology of her altered mental status was unclear because she had not received any offending medications, and it would be atypical for a blood glucose of 82 to cause symptoms.  #Acute on chronic kidney disease Her creatinine rose from her baseline of 1.64 to 2.55 with IV diuresis.  It was trending down at discharge, but should be rechecked at follow up.  #CAD She was started on aspirin 81 mg and atorvastatin 40 mg daily given her history of CAD.  Discharge Vitals:   BP 138/58 mmHg  Pulse 60  Temp(Src) 98 F (36.7 C) (Oral)  Resp 16  Ht 5\' 1"  (1.549 m)  Wt 130 lb 5.1 oz (59.111 kg)  BMI 24.64 kg/m2  SpO2 100%  Discharge Labs:  Results for orders placed or performed during the hospital encounter of 11/02/14 (from the past 24 hour(s))  Glucose, capillary     Status: Abnormal   Collection Time: 11/07/14  8:56 PM  Result Value Ref Range   Glucose-Capillary 107 (H) 70 - 99 mg/dL  Basic metabolic panel     Status: Abnormal   Collection Time: 11/08/14  5:10 AM  Result Value Ref Range   Sodium 139 135 - 145 mmol/L   Potassium 3.5 3.5 - 5.1 mmol/L   Chloride 107 96 - 112 mmol/L   CO2 24 19 - 32 mmol/L   Glucose, Bld 93 70 - 99 mg/dL   BUN 45 (H) 6 - 23 mg/dL   Creatinine, Ser 2.38 (H) 0.50 - 1.10 mg/dL   Calcium 8.5 8.4 - 10.5 mg/dL   GFR calc non Af Amer  21 (L) >90 mL/min   GFR calc Af Amer 24 (L) >90 mL/min   Anion gap 8 5 - 15  Glucose, capillary     Status: None   Collection Time: 11/08/14  6:25 AM  Result Value Ref Range   Glucose-Capillary 77 70 - 99 mg/dL  Glucose, capillary     Status: Abnormal   Collection Time: 11/08/14 11:09 AM  Result Value Ref Range   Glucose-Capillary 113 (H) 70 - 99 mg/dL   Comment 1 Notify RN   Glucose, capillary     Status: Abnormal   Collection Time: 11/08/14  4:58 PM  Result Value Ref Range   Glucose-Capillary 102 (H) 70 - 99 mg/dL   Comment 1 Notify RN     Signed: Arman Filter, MD 11/08/2014, 7:43 PM    Services Ordered on Discharge: None. Equipment Ordered on Discharge: None.

## 2014-11-08 NOTE — Progress Notes (Signed)
CSW (Clinical Education officer, museum) notified by nursing staff that pt is ready for discharge and is reporting she does not have a ride home. CSW visited pt room and discussed this with pt. CSW notified pt that CSW can set pt up with non-emergent ambulance but insurance payment is not guaranteed. Pt upset with this information. Pt tried calling a friend, [name], and left voicemail to see if she can assist. CSW and pt nurse present for this phone call. CSW informed pt that pt nurse will be provided with medical necessity form and non-emergent ambulance transport phone number. If pt does not receive call back and have transportation arranged, pt nurse will call non-emergent transportation. Pt informed that pt nurse will check in at 5:30 and if plan is not established transport will be arranged. At this time, pt has no further hospital social work needs. Pt nurse provided with transportation form and phone number. CSW signing off.  [place], [place]

## 2014-11-08 NOTE — Plan of Care (Signed)
Problem: Phase I Progression Outcomes Goal: EF % per last Echo/documented,Core Reminder form on chart Outcome: Completed/Met Date Met:  11/08/14 EF performed 11/03/2013  Result is  20-25%

## 2014-11-08 NOTE — Progress Notes (Addendum)
*  PRELIMINARY RESULTS* Vascular Ultrasound Carotid Duplex (Doppler) has been completed.  Preliminary findings: Bilateral:  1-39% ICA stenosis. Right Vertebral artery flow is antegrade.  Left vertebral is not visualized.     Landry Mellow, RDMS, RVT  11/08/2014, 10:20 AM

## 2014-11-08 NOTE — Progress Notes (Signed)
Subjective:    She reports doing well this morning with no shortness of breath or chest pain currently. She also denies any numbness, weakness, or trouble speaking. Her diarrhea has resolved.  Interval Events: -Blood pressure slightly elevated, but improved after receiving hydralazine. -Creatinine trending down. -Myoview-low risk study. Cardiology does not recommend any additional workup.    Objective:    Vital Signs:   Temp:  [97.7 F (36.5 C)-98 F (36.7 C)] 97.7 F (36.5 C) (02/10 0914) Pulse Rate:  [60-65] 61 (02/10 0914) Resp:  [18] 18 (02/10 0914) BP: (97-161)/(39-67) 138/57 mmHg (02/10 0914) SpO2:  [100 %] 100 % (02/10 0914) Weight:  [130 lb 5.1 oz (59.111 kg)] 130 lb 5.1 oz (59.111 kg) (02/10 0500) Last BM Date: 11/06/14  24-hour weight change: Weight change: 2 lb 7.2 oz (1.111 kg)  Intake/Output:   Intake/Output Summary (Last 24 hours) at 11/08/14 1407 Last data filed at 11/08/14 1337  Gross per 24 hour  Intake   1500 ml  Output      0 ml  Net   1500 ml      Physical Exam: General: Alert and oriented, in no acute distress.  Lungs:  Normal respiratory effort. Clear to auscultation BL without crackles or wheezes.  Heart: RRR. S1 and S2 normal without gallop, murmur, or rubs.  Abdomen:  BS normoactive. Soft, Nondistended, non-tender.  No masses or organomegaly.  Extremities: No pretibial edema. Status post left below-knee amputation.      Labs:  Basic Metabolic Panel:  Recent Labs Lab 11/04/14 0351 11/05/14 0508 11/06/14 0520 11/07/14 0420 11/08/14 0510  NA 139 141 137 139 139  K 4.0 3.5 4.3 3.8 3.5  CL 107 109 106 108 107  CO2 23 28 28 24 24   GLUCOSE 83 85 102* 98 93  BUN 26* 34* 43* 48* 45*  CREATININE 2.22* 2.17* 2.51* 2.55* 2.38*  CALCIUM 8.5 8.4 8.6 8.8 8.5  MG 1.7 1.8  --  1.8  --    CBC:  Recent Labs Lab 11/02/14 1019 11/04/14 0351 11/05/14 0508 11/06/14 0520 11/07/14 0420  WBC 4.2 5.8 5.5 4.1 5.2  HGB 12.7 10.5* 10.1*  10.3* 10.4*  HCT 37.5 32.7* 30.7* 32.1* 30.6*  MCV 87.8 92.1 89.5 91.7 89.5  PLT 165 144* 134* 129* 140*    Cardiac Enzymes:  Recent Labs Lab 11/02/14 1920 11/02/14 2230 11/03/14 0524  TROPONINI <0.03 <0.03 <0.03   CBG:  Recent Labs Lab 11/06/14 2200 11/07/14 1628 11/07/14 2056 11/08/14 0625 11/08/14 1109  GLUCAP 140* 99 107* 50 113*    Microbiology: Results for orders placed or performed during the hospital encounter of 11/02/14  Respiratory virus panel (routine influenza)     Status: None   Collection Time: 11/02/14  6:51 PM  Result Value Ref Range Status   Respiratory Syncytial Virus A Negative Negative Final   Respiratory Syncytial Virus B Negative Negative Final   Influenza A Negative Negative Final   Influenza B Negative Negative Final   Parainfluenza 1 Negative Negative Final   Parainfluenza 2 Negative Negative Final   Parainfluenza 3 Negative Negative Final   Metapneumovirus Negative Negative Final   Rhinovirus Negative Negative Final   Adenovirus Negative Negative Final    Comment: (NOTE) Performed At: [facility] [place], [place] [id number] Lindon Romp MD [id number]   MRSA PCR Screening     Status: Abnormal   Collection Time: 11/02/14  7:20 PM  Result Value Ref Range Status  MRSA by PCR POSITIVE (A) NEGATIVE Final    Comment:        The GeneXpert MRSA Assay (FDA approved for NASAL specimens only), is one component of a comprehensive MRSA colonization surveillance program. It is not intended to diagnose MRSA infection nor to guide or monitor treatment for MRSA infections. RESULT CALLED TO, READ BACK BY AND VERIFIED WITHRenard Hamper RN 2201 11/02/14 A BROWNING   Culture, expectorated sputum-assessment     Status: None   Collection Time: 11/03/14  2:35 PM  Result Value Ref Range Status   Specimen Description SPUTUM  Final   Special Requests NONE  Final   Sputum evaluation   Final    THIS SPECIMEN IS ACCEPTABLE.  RESPIRATORY CULTURE REPORT TO FOLLOW.   Report Status 11/03/2014 FINAL  Final  Culture, respiratory (NON-Expectorated)     Status: None   Collection Time: 11/03/14  2:35 PM  Result Value Ref Range Status   Specimen Description SPUTUM  Final   Special Requests NONE  Final   Gram Stain   Final    FEW WBC PRESENT,BOTH PMN AND MONONUCLEAR RARE SQUAMOUS EPITHELIAL CELLS PRESENT FEW GRAM POSITIVE COCCI IN PAIRS IN CLUSTERS RARE GRAM NEGATIVE RODS Performed at [facility]    Culture   Final    NORMAL OROPHARYNGEAL FLORA Performed at [facility]    Report Status 11/05/2014 FINAL  Final  Clostridium Difficile by PCR     Status: Abnormal   Collection Time: 11/03/14  9:48 PM  Result Value Ref Range Status   C difficile by pcr POSITIVE (A) NEGATIVE Final    Comment: CRITICAL RESULT CALLED TO, READ BACK BY AND VERIFIED WITH: VASIADIS,A RN 11/03/14 1119 WOOTEN,K      Imaging: Nm Myocar Multi W/spect W/wall Motion / Ef  11/07/2014   CLINICAL DATA:  CHF  EXAM: MYOCARDIAL IMAGING WITH SPECT (REST AND PHARMACOLOGIC-STRESS)  GATED LEFT VENTRICULAR WALL MOTION STUDY  LEFT VENTRICULAR EJECTION FRACTION  TECHNIQUE: Standard myocardial SPECT imaging was performed after resting intravenous injection of 10 mCi Tc-29m sestamibi. Subsequently, intravenous infusion of Lexiscan was performed under the supervision of the Cardiology staff. At peak effect of the drug, 30 mCi Tc-41m sestamibi was injected intravenously and standard myocardial SPECT imaging was performed. Quantitative gated imaging was also performed to evaluate left ventricular wall motion, and estimate left ventricular ejection fraction.  COMPARISON:  None.  FINDINGS: Baseline EKG showed NSR with ventricular paced rhythm. During Lexiscan infusion there were no changes from baseline EKG. No complaints of chest pain.  RAW images showed increased gut uptake below the diaphragm.  Perfusion: Small in size, mild in intensity defect  involving the anterolateral wall.  Wall Motion: Low normal left ventricular wall motion. No left ventricular dilation.  Left Ventricular Ejection Fraction: 51 %  End diastolic volume 123XX123 ml  End systolic volume 58 ml  IMPRESSION: 1. Small in size, mild in intensity defect involving the mid and distal anterior and anterolateral wall. There is reduced uptake overall in all areas on stress images as compared to rest images so anterior defect could be artifactual.  The polar plot demonstrates this as a very small area of ischemia in the anterolateral wall.  2.  Low normal left ventricular wall motion.  3. Left ventricular ejection fraction 51%  4. Low-risk stress test findings*.  *2012 Appropriate Use Criteria for Coronary Revascularization Focused Update: J Am Coll Cardiol. N6492421. http://content.airportbarriers.com.aspx?articleid=1201161   Electronically Signed   By: Fransico Him  On: 11/07/2014 18:04       Medications:    Infusions:    Scheduled Medications: . antiseptic oral rinse  7 mL Mouth Rinse q12n4p  . aspirin  81 mg Oral Daily  . carvedilol  12.5 mg Oral BID WC  . chlorhexidine  15 mL Mouth Rinse BID  . furosemide  40 mg Oral Daily  . gabapentin  400 mg Oral TID  . heparin  5,000 Units Subcutaneous 3 times per day  . isosorbide-hydrALAZINE  1 tablet Oral TID  . latanoprost  1 drop Both Eyes QHS  . metroNIDAZOLE  500 mg Oral 3 times per day  . prednisoLONE acetate  1 drop Left Eye QID  . regadenoson  0.4 mg Intravenous Once  . sodium chloride  3 mL Intravenous Q12H  . sodium chloride  3 mL Intravenous Q12H    PRN Medications: sodium chloride, acetaminophen, albuterol, benzonatate, ondansetron **OR** ondansetron (ZOFRAN) IV, sodium chloride, traZODone   Assessment/ Plan:    Principal Problem:   Acute on chronic combined systolic and diastolic CHF (congestive heart failure) Active Problems:   Hyperlipidemia   TOBACCO USER   Essential hypertension   Type 2  diabetes mellitus with diabetic chronic kidney disease   CKD stage 3 due to type 2 diabetes mellitus   SOB (shortness of breath)   Dyspnea   Pulmonary edema   Pleural effusion   C. difficile diarrhea   Cardiomyopathy  #Acute exacerbation of combined CHF in setting of restrictive cardiomyopathy  Shortness of breath continues to be improved after restarting her home medications. Her EF was improved on Myoview yesterday with no signs of ischemia. Cardiology does not recommend any additional workup. Her blood pressure is improving after starting hydralazine 10 mg 3 times a day. She has issues with medication compliance, so we'll switch to BiDil. -Strict ins and outs. -Switch from hydralazine and Imdur to BiDil 20-30 7.5 mg 3 times daily. -Continue home carvedilol 12.5 mg twice a day. -Continue furosemide 40 mg daily. -Hold ACE inhibitor in the setting of AKI. Sitter restarting a follow-up. -Continue Tessalon as needed for cough and albuterol as needed. -Stop respiratory precautions.  #Acute on chronic kidney disease Creatinine improved to 2.3 today despite continued Lasix use. -Continue Lasix 40 mg PO. -Follow up BMP as outpatient.  #C. difficile diarrhea  Diarrhea has resolved. -Continue by mouth Flagyl 500 mg 3 times a day, day 5 of 14.  #Coronary artery disease -Continue aspirin 81 mg daily. -Start atorvastatin 40 mg daily.  #Altered mental status  Resolved. -Continue to monitor.  #Anemia Stable. -Continue to monitor CBC.  #Type 2 diabetes Blood sugars stable off of sliding scale. -Continue to hold insulin.  -Continue gabapentin 400 mg 3 times a day.   #Insomnia -Continue trazodone 50 mg as needed for sleep.    DVT PPX - heparin  CODE STATUS -  full   CONSULTS PLACED - Neurology, cardiology.  Disposition is deferred at this time, awaiting improvement of congestive heart failure and CAD work-up.  Anticipated discharge in approximately 1-2 day(s).   The patient  does have a current PCP Hulen Luster, Beverlee Nims, MD) and does need an [facility] hospital follow-up appointment after discharge.    Is the [facility] hospital follow-up appointment a one-time only appointment? no.  Does the patient have transportation limitations that hinder transportation to clinic appointments? yes   SERVICE NEEDED AT [place]         Y = Yes,  Blank = No PT:   OT:   RN:   Equipment:   Other:      Length of Stay: 6 day(s)   Signed: Arman Filter, MD  PGY-1, Internal Medicine Resident Pager: [phone] (7AM-5PM) 11/08/2014, 2:07 PM

## 2014-11-08 NOTE — Progress Notes (Signed)
Patient Name: [name] [name] Date of Encounter: 11/08/2014     Principal Problem:   Acute on chronic combined systolic and diastolic CHF (congestive heart failure) Active Problems:   Hyperlipidemia   TOBACCO USER   Essential hypertension   Type 2 diabetes mellitus with diabetic chronic kidney disease   CKD stage 3 due to type 2 diabetes mellitus   SOB (shortness of breath)   Dyspnea   Pulmonary edema   Pleural effusion   C. difficile diarrhea   Cardiomyopathy    SUBJECTIVE  No chest pain or dyspnea. Complains of abdominal swelling.   CURRENT MEDS . antiseptic oral rinse  7 mL Mouth Rinse q12n4p  . aspirin  81 mg Oral Daily  . carvedilol  12.5 mg Oral BID WC  . chlorhexidine  15 mL Mouth Rinse BID  . furosemide  40 mg Oral Daily  . gabapentin  400 mg Oral TID  . heparin  5,000 Units Subcutaneous 3 times per day  . hydrALAZINE  10 mg Oral 3 times per day  . isosorbide mononitrate  60 mg Oral Daily  . latanoprost  1 drop Both Eyes QHS  . metroNIDAZOLE  500 mg Oral 3 times per day  . prednisoLONE acetate  1 drop Left Eye QID  . regadenoson  0.4 mg Intravenous Once  . sodium chloride  3 mL Intravenous Q12H  . sodium chloride  3 mL Intravenous Q12H    OBJECTIVE  Filed Vitals:   11/07/14 2257 11/08/14 0500 11/08/14 0913 11/08/14 0914  BP: 136/58 161/67  138/57  Pulse: 61 65  61  Temp:  97.8 F (36.6 C) 97.7 F (36.5 C) 97.7 F (36.5 C)  TempSrc:  Oral Oral Oral  Resp:  18  18  Height:      Weight:  130 lb 5.1 oz (59.111 kg)    SpO2:  100%  100%    Intake/Output Summary (Last 24 hours) at 11/08/14 1040 Last data filed at 11/08/14 0850  Gross per 24 hour  Intake   1280 ml  Output      0 ml  Net   1280 ml   Filed Weights   11/06/14 0403 11/07/14 0636 11/08/14 0500  Weight: 120 lb 8 oz (54.658 kg) 127 lb 13.9 oz (58 kg) 130 lb 5.1 oz (59.111 kg)    PHYSICAL EXAM  General: Well developed, well nourished, in no acute distress Head: Eyes PERRLA, No  xanthomas. Normal cephalic and atramatic Lungs: diffuse wheezing. Decreased breath sounds at bases Heart: HRRR S1 S2 Pulses are 2+ & equal.  No carotid bruit. JVD to 6 cm. No abdominal bruits. No femoral bruits. Abdomen: Bowel sounds are positive, abdomen soft and non-tender without masses  Extremities: No clubbing, cyanosis or edema. DP +1 S/p L BKA Neuro: Alert and oriented X 3. Psych: Good affect, responds appropriately Accessory Clinical Findings  CBC  Recent Labs  11/06/14 0520 11/07/14 0420  WBC 4.1 5.2  HGB 10.3* 10.4*  HCT 32.1* 30.6*  MCV 91.7 89.5  PLT 129* [id number]*   Basic Metabolic Panel  Recent Labs  11/07/14 0420 11/08/14 0510  NA 139 139  K 3.8 3.5  CL 108 107  CO2 24 24  GLUCOSE 98 93  BUN 48* 45*  CREATININE 2.55* 2.38*  CALCIUM 8.8 8.5  MG 1.8  --     Radiology/Studies  Dg Neck Soft Tissue  11/02/2014   CLINICAL DATA:  Stridor.  Respiratory distress and cough.  EXAM: NECK SOFT  TISSUES - 1+ VIEW  COMPARISON:  None.  FINDINGS: There is no evidence of retropharyngeal soft tissue swelling or epiglottic enlargement. The cervical airway is unremarkable and no radio-opaque foreign body identified.  IMPRESSION: Negative exam.   Electronically Signed   By: Inge Rise M.D.   On: 11/02/2014 17:05   Dg Chest 2 View  11/03/2014   CLINICAL DATA:  Shortness of breath.  EXAM: CHEST  2 VIEW  COMPARISON:  November 02, 2014.  FINDINGS: Stable cardiomegaly. Left-sided pacemaker is unchanged in position. No pneumothorax is noted. Degenerative change of left glenohumeral joint is noted. Stable mild central pulmonary vascular congestion is noted with stable bibasilar interstitial densities most consistent with pulmonary edema. Mild bilateral pleural effusions are noted as well.  IMPRESSION: Stable findings consistent with congestive heart failure.   Electronically Signed   By: Sabino Dick M.D.   On: 11/03/2014 09:12   Ct Head Wo  Contrast  11/06/2014   CLINICAL DATA:  Patient unable to speak for 2-3 hr.  EXAM: CT HEAD WITHOUT CONTRAST  TECHNIQUE: Contiguous axial images were obtained from the base of the skull through the vertex without intravenous contrast.  COMPARISON:  Head CT scan 06/18/2009 and brain MRI 10/30/2009.  FINDINGS: Hypoattenuation in the subcortical and periventricular deep white matter is compatible with chronic microvascular ischemic change. No evidence of acute abnormality including hemorrhage, infarct, mass lesion, mass effect, midline shift or abnormal extra-axial fluid collection is identified. No hydrocephalus or pneumocephalus. The calvarium is intact. Carotid atherosclerosis noted.  IMPRESSION: No acute abnormality.  Chronic microvascular ischemic change.  Atherosclerosis.  Critical Value/emergent results were called by telephone at the time of interpretation on 11/06/2014 at 10:16 am to Dr. Larey Dresser , who verbally acknowledged these results.   Electronically Signed   By: Inge Rise M.D.   On: 11/06/2014 10:16   Nm Myocar Multi W/spect W/wall Motion / Ef  11/07/2014   CLINICAL DATA:  CHF  EXAM: MYOCARDIAL IMAGING WITH SPECT (REST AND PHARMACOLOGIC-STRESS)  GATED LEFT VENTRICULAR WALL MOTION STUDY  LEFT VENTRICULAR EJECTION FRACTION  TECHNIQUE: Standard myocardial SPECT imaging was performed after resting intravenous injection of 10 mCi Tc-78m sestamibi. Subsequently, intravenous infusion of Lexiscan was performed under the supervision of the Cardiology staff. At peak effect of the drug, 30 mCi Tc-20m sestamibi was injected intravenously and standard myocardial SPECT imaging was performed. Quantitative gated imaging was also performed to evaluate left ventricular wall motion, and estimate left ventricular ejection fraction.  COMPARISON:  None.  FINDINGS: Baseline EKG showed NSR with ventricular paced rhythm. During Lexiscan infusion there were no changes from baseline EKG. No complaints of chest pain.   RAW images showed increased gut uptake below the diaphragm.  Perfusion: Small in size, mild in intensity defect involving the anterolateral wall.  Wall Motion: Low normal left ventricular wall motion. No left ventricular dilation.  Left Ventricular Ejection Fraction: 51 %  End diastolic volume 123XX123 ml  End systolic volume 58 ml  IMPRESSION: 1. Small in size, mild in intensity defect involving the mid and distal anterior and anterolateral wall. There is reduced uptake overall in all areas on stress images as compared to rest images so anterior defect could be artifactual.  The polar plot demonstrates this as a very small area of ischemia in the anterolateral wall.  2.  Low normal left ventricular wall motion.  3. Left ventricular ejection fraction 51%  4. Low-risk stress test findings*.  *2012 Appropriate Use Criteria for Coronary Revascularization Focused Update:  J Am Coll Cardiol. N6492421. http://content.airportbarriers.com.aspx?articleid=1201161   Electronically Signed   By: Fransico Him   On: 11/07/2014 18:04   Dg Chest Port 1 View  11/02/2014   CLINICAL DATA:  Shortness of breath, nausea, sore throat  EXAM: PORTABLE CHEST - 1 VIEW  COMPARISON:  12/09/2012  FINDINGS: Stable orientation of dual-chamber pacer leads from the left.  Mild cardiomegaly is unchanged. Fullness of the hila which is new and likely congestion. There is interstitial coarsening and haziness of the lower chest compatible with pleural effusions.  Posttraumatic deformity of the left proximal humerus.  IMPRESSION: Mild CHF.   Electronically Signed   By: Jorje Guild M.D.   On: 11/02/2014 11:09    ASSESSMENT AND PLAN  HENESSY MCCRANEY is a 64 year old woman with history of DM, CKD, PVD s/p bilateral fempop bypass, HTN, L BKA, nonobstructive CAD, and sinus arrest s/p PPM who presented to [facility] on 11/05/14 with shortness of breath. On admission BNP >4500 and chest xray c/w mild CHF. 2D ECHO with EF 20-25%   Acute on chronic  combined systolic/diastolic CHF/ Pulm HTN - 2D echo on admit showed severely reduced LVF EF 20-25% and restrictive physiology which is new from echo 2011. She has mild RV enlargement and severe pulmonary HTN as well. -- Myoview 11/07/14 shows ? Minimal anterior ischemia versus artifact. EF 51%. Low risk study. -- Continue with nitrates/BB. Avoid ACE I in setting of renal failure. On Hydralazine 10mg  TID for afterload reduction. Continue lasix 40 mg daily. Weight is up a little but does not appear volume overloaded. ? Due to improved appetite and less diarrhea.   Mild nonobstructive ASCAD by cath 2005 currently with no angina. Low risk myoview  Non-oliguric AKI on CKD stage III. Creat 2.17--> 2.55 >> 2.38 today.  C diff diarrhea- per IM  Anemia- H/H 10.3/32.1. per IM  HTN with good control    Annelyse Rey [place], [place] 11/08/2014 10:40 AM

## 2014-11-08 NOTE — Discharge Instructions (Signed)
·   Thank you for allowing [place] to be involved in your healthcare while you were hospitalized at [facility].   Please note that there have been changes to your home medications.  --> PLEASE LOOK AT YOUR DISCHARGE MEDICATION LIST FOR DETAILS.   Please call your PCP if you have any questions or concerns.  Please return to the ER if you have worsening of your symptoms or new severe symptoms arise.  You'll need to continue to take Flagyl 3 times a day for another 9 days to get rid of your diarrhea.  Please let us know if you have any difficulty getting any of your medications.  It'll be important for you to keep up with your blood pressure and water pills to make sure you don't get short of breath again

## 2014-11-08 NOTE — Progress Notes (Signed)
SLP Cancellation Note  Patient Details Name: [name] [name] MRN: [id number] DOB: 11-Jul-1951   Cancelled treatment:       Reason Eval/Treat Not Completed: Other (comment). MD confirmed SLP eval not needed. Will cancel order.    Tanyiah Laurich, Katherene Ponto 11/08/2014, 10:02 AM

## 2014-11-09 NOTE — Progress Notes (Signed)
Late entry from 11/08/14.  CSW called by ED Charge RN re: [name] [name] being unable to transport pt to her house because she had no electricity.  Pt d/c from 3E,but did not mention to anyone (except [facility] EMTs) that she had no power.  Pt insisting that she go home and has made arrangements to get money from her life insurance to pay her bill on 11/13/14.  Pt was w/o power PTA,  lives alone and independent with ADLs.  Pt states that she has blankets and candles at home and her sister lives in the same apartment complex.  Pt agreeable to have CSW to call her sister, [name] [name], to see if pt could stay with her.  [name] [name] refused to let pt stay with her because "her other sister would be coming" and she didn't have the space for everyone.  [name] [name], however, was able to arrange a ride home for pt since [facility] could not take her.  CSW provided pt with food/drink from the ED and discussed resources that may be able to help pt with her power bill.  CSW sat with pt until her ride came and offered emotional support.

## 2014-11-15 ENCOUNTER — Ambulatory Visit: Payer: Medicaid Other | Admitting: Internal Medicine

## 2014-11-22 ENCOUNTER — Ambulatory Visit (INDEPENDENT_AMBULATORY_CARE_PROVIDER_SITE_OTHER): Payer: Medicaid Other | Admitting: Podiatry

## 2014-11-22 ENCOUNTER — Encounter: Payer: Self-pay | Admitting: Podiatry

## 2014-11-22 VITALS — BP 135/70 | HR 65 | Temp 97.5°F | Resp 16

## 2014-11-22 DIAGNOSIS — L84 Corns and callosities: Secondary | ICD-10-CM

## 2014-11-22 DIAGNOSIS — E1151 Type 2 diabetes mellitus with diabetic peripheral angiopathy without gangrene: Secondary | ICD-10-CM | POA: Diagnosis not present

## 2014-11-22 DIAGNOSIS — B351 Tinea unguium: Secondary | ICD-10-CM | POA: Diagnosis not present

## 2014-11-22 DIAGNOSIS — M79674 Pain in right toe(s): Secondary | ICD-10-CM

## 2014-11-22 NOTE — Patient Instructions (Signed)
Diabetes and Foot Care Diabetes may cause you to have problems because of poor blood supply (circulation) to your feet and legs. This may cause the skin on your feet to become thinner, break easier, and heal more slowly. Your skin may become dry, and the skin may peel and crack. You may also have nerve damage in your legs and feet causing decreased feeling in them. You may not notice minor injuries to your feet that could lead to infections or more serious problems. Taking care of your feet is one of the most important things you can do for yourself.  HOME CARE INSTRUCTIONS  Wear shoes at all times, even in the house. Do not go barefoot. Bare feet are easily injured.  Check your feet daily for blisters, cuts, and redness. If you cannot see the bottom of your feet, use a mirror or ask someone for help.  Wash your feet with warm water (do not use hot water) and mild soap. Then pat your feet and the areas between your toes until they are completely dry. Do not soak your feet as this can dry your skin.  Apply a moisturizing lotion or petroleum jelly (that does not contain alcohol and is unscented) to the skin on your feet and to dry, brittle toenails. Do not apply lotion between your toes.  Trim your toenails straight across. Do not dig under them or around the cuticle. File the edges of your nails with an emery board or nail file.  Do not cut corns or calluses or try to remove them with medicine.  Wear clean socks or stockings every day. Make sure they are not too tight. Do not wear knee-high stockings since they may decrease blood flow to your legs.  Wear shoes that fit properly and have enough cushioning. To break in new shoes, wear them for just a few hours a day. This prevents you from injuring your feet. Always look in your shoes before you put them on to be sure there are no objects inside.  Do not cross your legs. This may decrease the blood flow to your feet.  If you find a minor scrape,  cut, or break in the skin on your feet, keep it and the skin around it clean and dry. These areas may be cleansed with mild soap and water. Do not cleanse the area with peroxide, alcohol, or iodine.  When you remove an adhesive bandage, be sure not to damage the skin around it.  If you have a wound, look at it several times a day to make sure it is healing.  Do not use heating pads or hot water bottles. They may burn your skin. If you have lost feeling in your feet or legs, you may not know it is happening until it is too late.  Make sure your health care provider performs a complete foot exam at least annually or more often if you have foot problems. Report any cuts, sores, or bruises to your health care provider immediately. SEEK MEDICAL CARE IF:   You have an injury that is not healing.  You have cuts or breaks in the skin.  You have an ingrown nail.  You notice redness on your legs or feet.  You feel burning or tingling in your legs or feet.  You have pain or cramps in your legs and feet.  Your legs or feet are numb.  Your feet always feel cold. SEEK IMMEDIATE MEDICAL CARE IF:   There is increasing redness,   swelling, or pain in or around a wound.  There is a red line that goes up your leg.  Pus is coming from a wound.  You develop a fever or as directed by your health care provider.  You notice a bad smell coming from an ulcer or wound. Document Released: 09/12/2000 Document Revised: 05/18/2013 Document Reviewed: 02/22/2013 ExitCare Patient Information 2015 [employer]. This information is not intended to replace advice given to you by your health care provider. Make sure you discuss any questions you have with your health care provider.  

## 2014-11-22 NOTE — Progress Notes (Signed)
Patient ID: [name] [name], female   DOB: 10/15/1950, 64 y.o.   MRN: [id number]  Subjective: This patient presents for ongoing debridement of pre-ulcerative keratoses in the plantar fifth right MPJ and is also complaining of uncomfortable toenails on the right foot  Objective: BK amputation left Hemorrhagic plantar keratoses fifth right MPJ Distal keratoses right hallux The toenails 1-5 right are hypertrophic, incurvated, discolored and tender direct pressure  Assessment: Pre-ulcerative hemorrhagic keratoses fifth right MPJ Symptomatic onychomycoses one through 5 right Diabetic peripheral neuropathy Diabetic peripheral arterial disease Plantar scaling right heel  Plan: Debride hemorrhagic keratoses 1 Debrided toenails 1 through 5 right without any bleeding  Patient will wear diabetic shoe on the right with additional pad to offload fifth right MPJ

## 2014-11-25 ENCOUNTER — Other Ambulatory Visit: Payer: Self-pay | Admitting: Internal Medicine

## 2014-11-30 ENCOUNTER — Encounter: Payer: Self-pay | Admitting: Internal Medicine

## 2014-12-13 ENCOUNTER — Ambulatory Visit (INDEPENDENT_AMBULATORY_CARE_PROVIDER_SITE_OTHER): Payer: Medicaid Other | Admitting: Podiatry

## 2014-12-13 ENCOUNTER — Encounter: Payer: Self-pay | Admitting: Podiatry

## 2014-12-13 VITALS — BP 144/78 | HR 77 | Resp 12

## 2014-12-13 DIAGNOSIS — E1151 Type 2 diabetes mellitus with diabetic peripheral angiopathy without gangrene: Secondary | ICD-10-CM | POA: Diagnosis not present

## 2014-12-13 DIAGNOSIS — Q6689 Other  specified congenital deformities of feet: Secondary | ICD-10-CM

## 2014-12-13 DIAGNOSIS — L84 Corns and callosities: Secondary | ICD-10-CM

## 2014-12-14 NOTE — Progress Notes (Signed)
Patient ID: [name] [name], female   DOB: [birth year], 64 y.o.   MRN: [id number]  Subjective: Patient presents today for ongoing debridement of pre-ulcerative keratoses on the plantar fifth right MPJ.  Objective: BK amputation left Hemorrhagic plantar keratoses fifth right MPJ that remains closed after debridement Distal keratoses right hallux  Assessment: Pre-ulcerative plantar callus fifth MPJ right Keratoses distal right hallux History of diabetic peripheral arterial disease History of diabetic peripheral neuropathy  Plan: Debride plantar keratoses and distal keratoses without any bleeding  Patient will continue wearing diabetic shoe on right foot with an additional pad attached to the diabetic insole to offload the fifth right MPJ.  Reappoint 3 weeks

## 2015-01-03 ENCOUNTER — Encounter: Payer: Self-pay | Admitting: Podiatry

## 2015-01-03 ENCOUNTER — Ambulatory Visit (INDEPENDENT_AMBULATORY_CARE_PROVIDER_SITE_OTHER): Payer: Medicaid Other | Admitting: Podiatry

## 2015-01-03 VITALS — BP 180/104 | HR 65 | Resp 18

## 2015-01-03 DIAGNOSIS — Q6689 Other  specified congenital deformities of feet: Secondary | ICD-10-CM

## 2015-01-03 DIAGNOSIS — E1142 Type 2 diabetes mellitus with diabetic polyneuropathy: Secondary | ICD-10-CM

## 2015-01-03 DIAGNOSIS — L84 Corns and callosities: Secondary | ICD-10-CM

## 2015-01-03 DIAGNOSIS — E1151 Type 2 diabetes mellitus with diabetic peripheral angiopathy without gangrene: Secondary | ICD-10-CM

## 2015-01-03 NOTE — Patient Instructions (Signed)
Diabetes and Foot Care Diabetes may cause you to have problems because of poor blood supply (circulation) to your feet and legs. This may cause the skin on your feet to become thinner, break easier, and heal more slowly. Your skin may become dry, and the skin may peel and crack. You may also have nerve damage in your legs and feet causing decreased feeling in them. You may not notice minor injuries to your feet that could lead to infections or more serious problems. Taking care of your feet is one of the most important things you can do for yourself.  HOME CARE INSTRUCTIONS  Wear shoes at all times, even in the house. Do not go barefoot. Bare feet are easily injured.  Check your feet daily for blisters, cuts, and redness. If you cannot see the bottom of your feet, use a mirror or ask someone for help.  Wash your feet with warm water (do not use hot water) and mild soap. Then pat your feet and the areas between your toes until they are completely dry. Do not soak your feet as this can dry your skin.  Apply a moisturizing lotion or petroleum jelly (that does not contain alcohol and is unscented) to the skin on your feet and to dry, brittle toenails. Do not apply lotion between your toes.  Trim your toenails straight across. Do not dig under them or around the cuticle. File the edges of your nails with an emery board or nail file.  Do not cut corns or calluses or try to remove them with medicine.  Wear clean socks or stockings every day. Make sure they are not too tight. Do not wear knee-high stockings since they may decrease blood flow to your legs.  Wear shoes that fit properly and have enough cushioning. To break in new shoes, wear them for just a few hours a day. This prevents you from injuring your feet. Always look in your shoes before you put them on to be sure there are no objects inside.  Do not cross your legs. This may decrease the blood flow to your feet.  If you find a minor scrape,  cut, or break in the skin on your feet, keep it and the skin around it clean and dry. These areas may be cleansed with mild soap and water. Do not cleanse the area with peroxide, alcohol, or iodine.  When you remove an adhesive bandage, be sure not to damage the skin around it.  If you have a wound, look at it several times a day to make sure it is healing.  Do not use heating pads or hot water bottles. They may burn your skin. If you have lost feeling in your feet or legs, you may not know it is happening until it is too late.  Make sure your health care provider performs a complete foot exam at least annually or more often if you have foot problems. Report any cuts, sores, or bruises to your health care provider immediately. SEEK MEDICAL CARE IF:   You have an injury that is not healing.  You have cuts or breaks in the skin.  You have an ingrown nail.  You notice redness on your legs or feet.  You feel burning or tingling in your legs or feet.  You have pain or cramps in your legs and feet.  Your legs or feet are numb.  Your feet always feel cold. SEEK IMMEDIATE MEDICAL CARE IF:   There is increasing redness,   swelling, or pain in or around a wound.  There is a red line that goes up your leg.  Pus is coming from a wound.  You develop a fever or as directed by your health care provider.  You notice a bad smell coming from an ulcer or wound. Document Released: 09/12/2000 Document Revised: 05/18/2013 Document Reviewed: 02/22/2013 ExitCare Patient Information 2015 [employer]. This information is not intended to replace advice given to you by your health care provider. Make sure you discuss any questions you have with your health care provider.  

## 2015-01-03 NOTE — Progress Notes (Signed)
   Subjective:    Patient ID: [name] [name], female    DOB: 1951-04-14, 64 y.o.   MRN: [id number]  HPI  I NEED MY CALLUSES TRIMMED UP ON MY RIGHT FOOT This patient presents today for ongoing evaluation treatment for pre-ulcerative plantar keratoses right. She has also in need of replacement diabetic shoes  History of BK amputation left  Review of Systems     Objective:   Physical Exam Orientated 3 Patient transfers from wheelchair to treatment chair  Hemorrhagic plantar keratoses fifth MPJ right Distal keratoses right hallux without bleeding Hallux malleus right       Assessment & Plan:   Assessment: History of diabetic peripheral neuropathy History of diabetic peripheral arterial disease BK amputation left Pre-ulcerative plantar callus sub-fifth right MPJ Hallux malleus right  Plan: Debrided pre-ulcerative plantar callus right Continue to wear existing diabetic insole with additional felt padding to offload fifth MPJ right  Obtain medical certification for diabetic shoes Will need 2 shoes right foot and left to match on the prosthetic limb Certification indication: Type II diabetic History of BK amputation left History of diabetic peripheral neuropathy History of diabetic peripheral arterial disease Pre-ulcerative callus plantar fifth right MPJ Hallux malleus right  Reappoint 3 weeks

## 2015-01-10 ENCOUNTER — Telehealth: Payer: Self-pay | Admitting: *Deleted

## 2015-01-10 NOTE — Telephone Encounter (Addendum)
Pt request Dr. Amalia Hailey order new wheelchair from [facility], fax [phone].  Informed pt the process had been started for her wheelchair.

## 2015-01-24 ENCOUNTER — Ambulatory Visit (INDEPENDENT_AMBULATORY_CARE_PROVIDER_SITE_OTHER): Payer: Medicaid Other | Admitting: Podiatry

## 2015-01-24 ENCOUNTER — Encounter: Payer: Self-pay | Admitting: Podiatry

## 2015-01-24 VITALS — BP 165/76 | HR 60 | Temp 97.4°F | Resp 12

## 2015-01-24 DIAGNOSIS — I739 Peripheral vascular disease, unspecified: Secondary | ICD-10-CM | POA: Diagnosis not present

## 2015-01-24 DIAGNOSIS — E1151 Type 2 diabetes mellitus with diabetic peripheral angiopathy without gangrene: Secondary | ICD-10-CM

## 2015-01-24 DIAGNOSIS — Q828 Other specified congenital malformations of skin: Secondary | ICD-10-CM | POA: Diagnosis not present

## 2015-01-24 DIAGNOSIS — L84 Corns and callosities: Secondary | ICD-10-CM

## 2015-01-24 NOTE — Progress Notes (Signed)
Patient ID: [name] [name], female   DOB: [birth year], 64 y.o.   MRN: [id number]   Subjective: This patient presents for ongoing debridement of pre-ulcerative plantar callus and right fifth MPJ Diabetic shoes have been tinnitus patient is a medicated patient and will need a direct referral to an orthotist  Objective: Hemorrhagic plantar keratoses of right fifth MPJ that remains closed after debridement The toenails are brittle hypertrophic, discolored 1-5 BK amputation left  Assessment Indications for diabetic shoes and custom insoles below Pre-ulcerative plantar callus sub-fifth right MPJ History of diabetic peripheral arterial disease History of diabetic peripheral neuropathy Hallux malleus right  Plan: Debridement plantar callus right Patient will continue wearing existing diabetic shoes with additional felt pad attached offload the fifth right MPJ  Rx shoe prescription for diabetic shoe right/left with Plastizote insoles Deep pocket to offload the fifth right MPJ Refer patient to [facility]  Reappoint 3 weeks

## 2015-01-24 NOTE — Patient Instructions (Signed)
We are referring you to [facility] for diabetic shoes Call them to make an appointment  Diabetes and Foot Care Diabetes may cause you to have problems because of poor blood supply (circulation) to your feet and legs. This may cause the skin on your feet to become thinner, break easier, and heal more slowly. Your skin may become dry, and the skin may peel and crack. You may also have nerve damage in your legs and feet causing decreased feeling in them. You may not notice minor injuries to your feet that could lead to infections or more serious problems. Taking care of your feet is one of the most important things you can do for yourself.  HOME CARE INSTRUCTIONS  Wear shoes at all times, even in the house. Do not go barefoot. Bare feet are easily injured.  Check your feet daily for blisters, cuts, and redness. If you cannot see the bottom of your feet, use a mirror or ask someone for help.  Wash your feet with warm water (do not use hot water) and mild soap. Then pat your feet and the areas between your toes until they are completely dry. Do not soak your feet as this can dry your skin.  Apply a moisturizing lotion or petroleum jelly (that does not contain alcohol and is unscented) to the skin on your feet and to dry, brittle toenails. Do not apply lotion between your toes.  Trim your toenails straight across. Do not dig under them or around the cuticle. File the edges of your nails with an emery board or nail file.  Do not cut corns or calluses or try to remove them with medicine.  Wear clean socks or stockings every day. Make sure they are not too tight. Do not wear knee-high stockings since they may decrease blood flow to your legs.  Wear shoes that fit properly and have enough cushioning. To break in new shoes, wear them for just a few hours a day. This prevents you from injuring your feet. Always look in your shoes before you put them on to be sure there are no objects inside.  Do not cross  your legs. This may decrease the blood flow to your feet.  If you find a minor scrape, cut, or break in the skin on your feet, keep it and the skin around it clean and dry. These areas may be cleansed with mild soap and water. Do not cleanse the area with peroxide, alcohol, or iodine.  When you remove an adhesive bandage, be sure not to damage the skin around it.  If you have a wound, look at it several times a day to make sure it is healing.  Do not use heating pads or hot water bottles. They may burn your skin. If you have lost feeling in your feet or legs, you may not know it is happening until it is too late.  Make sure your health care provider performs a complete foot exam at least annually or more often if you have foot problems. Report any cuts, sores, or bruises to your health care provider immediately. SEEK MEDICAL CARE IF:   You have an injury that is not healing.  You have cuts or breaks in the skin.  You have an ingrown nail.  You notice redness on your legs or feet.  You feel burning or tingling in your legs or feet.  You have pain or cramps in your legs and feet.  Your legs or feet are numb.  Your feet always feel cold. SEEK IMMEDIATE MEDICAL CARE IF:   There is increasing redness, swelling, or pain in or around a wound.  There is a red line that goes up your leg.  Pus is coming from a wound.  You develop a fever or as directed by your health care provider.  You notice a bad smell coming from an ulcer or wound. Document Released: 09/12/2000 Document Revised: 05/18/2013 Document Reviewed: 02/22/2013 [facility] Patient Information 2015 [place], [place]. This information is not intended to replace advice given to you by your health care provider. Make sure you discuss any questions you have with your health care provider.

## 2015-01-31 ENCOUNTER — Ambulatory Visit (INDEPENDENT_AMBULATORY_CARE_PROVIDER_SITE_OTHER): Payer: Medicaid Other | Admitting: Internal Medicine

## 2015-01-31 ENCOUNTER — Encounter: Payer: Self-pay | Admitting: Internal Medicine

## 2015-01-31 VITALS — BP 195/81 | HR 54 | Temp 98.5°F | Ht 62.0 in | Wt 112.3 lb

## 2015-01-31 DIAGNOSIS — K529 Noninfective gastroenteritis and colitis, unspecified: Secondary | ICD-10-CM | POA: Diagnosis present

## 2015-01-31 DIAGNOSIS — E785 Hyperlipidemia, unspecified: Secondary | ICD-10-CM | POA: Diagnosis not present

## 2015-01-31 DIAGNOSIS — Z89512 Acquired absence of left leg below knee: Secondary | ICD-10-CM

## 2015-01-31 DIAGNOSIS — T8789 Other complications of amputation stump: Secondary | ICD-10-CM

## 2015-01-31 DIAGNOSIS — R197 Diarrhea, unspecified: Secondary | ICD-10-CM

## 2015-01-31 DIAGNOSIS — I10 Essential (primary) hypertension: Secondary | ICD-10-CM

## 2015-01-31 DIAGNOSIS — M79609 Pain in unspecified limb: Secondary | ICD-10-CM

## 2015-01-31 MED ORDER — TRAMADOL HCL 50 MG PO TABS: 50.0000 mg | ORAL_TABLET | Freq: Four times a day (QID) | ORAL | Status: DC | PRN

## 2015-01-31 MED ORDER — FUROSEMIDE 40 MG PO TABS: 40.0000 mg | ORAL_TABLET | Freq: Every day | ORAL | Status: DC

## 2015-01-31 MED ORDER — DIPHENOXYLATE-ATROPINE 2.5-0.025 MG/5ML PO LIQD: 5.0000 mL | Freq: Four times a day (QID) | ORAL | Status: DC | PRN

## 2015-01-31 MED ORDER — ISOSORB DINITRATE-HYDRALAZINE 20-37.5 MG PO TABS: 1.0000 | ORAL_TABLET | Freq: Three times a day (TID) | ORAL | Status: DC

## 2015-01-31 MED ORDER — ATORVASTATIN CALCIUM 40 MG PO TABS: 40.0000 mg | ORAL_TABLET | Freq: Every day | ORAL | Status: DC

## 2015-01-31 MED ORDER — CARVEDILOL 12.5 MG PO TABS: 12.5000 mg | ORAL_TABLET | Freq: Two times a day (BID) | ORAL | Status: DC

## 2015-02-01 NOTE — Assessment & Plan Note (Signed)
Pt has hx of chronic diarrhea. Requesting lomitil liquid as it has worked for her in the past. Was recently tx for c. Diff during last hospital admission in February with flagyl. She had 2 runny BMs today and 4 BMs on Saturday. Denies fever, ns, chills.   - rx for lomitil

## 2015-02-01 NOTE — Assessment & Plan Note (Signed)
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 She recently was given another stump casing but is still having pain. She has tried ibuprofen 600mg  TID for pain but that is no longer working. Stump is TTP but no ulceration or erythema noted.   - rx for tramadol 50mg  TID. Instructed pt she will need to get prosthetic leg adjusted or get a new one.

## 2015-02-01 NOTE — Assessment & Plan Note (Signed)
BP Readings from Last 3 Encounters:  01/31/15 195/81  01/24/15 165/76  01/03/15 180/104    Lab Results  Component Value Date   NA 139 11/08/2014   K 3.5 11/08/2014   CREATININE 2.38* 11/08/2014    Assessment: Blood pressure control:   not controlled Progress toward BP goal:    deteriorated  Comments: has been out of home meds x 2 weeks.   Plan: Medications:  Refilled bidil, coreg, and lasix.  Other plans: f/u in 3-6 months.

## 2015-02-01 NOTE — Progress Notes (Signed)
   Subjective:    Patient ID: Maryann Ferrigno, female    DOB: 08-24-1951, 64 y.o.   MRN: [id number]  HPI Pt is a 64 y/o F w/ PMHx of HTN, chronic diarrhea, lt BKA, and HLD who presents to clinic for left BKA stump pain, DM shoe request, and medication refills. Please see problem list for further details.     Review of Systems  Constitutional: Negative for fever and unexpected weight change.  Respiratory: Negative for shortness of breath.   Cardiovascular: Negative for chest pain.  Musculoskeletal:       Left stump pain   Psychiatric/Behavioral:       Mood stable        Objective:   Physical Exam  Constitutional: She is oriented to person, place, and time. She appears well-developed and well-nourished.  HENT:  Head: Normocephalic.  Cardiovascular: Normal rate and regular rhythm.   Pulmonary/Chest: Effort normal and breath sounds normal.  Abdominal: Soft. Bowel sounds are normal.  Musculoskeletal:  Left stump TTP, no skin breaks or redness.   Neurological: She is alert and oriented to person, place, and time.          Assessment & Plan:  Please see problem based assessment and plan.

## 2015-02-01 NOTE — Assessment & Plan Note (Signed)
Refilled lipitor 40mg .

## 2015-02-04 NOTE — Progress Notes (Signed)
Medicine attending: I personally interviewed and briefly examined this patient and reviewed pertinent laboratory and radiographic data together with resident physician Dr.Diana Hulen Luster on the day of the patient visitAnd I concur with her evaluation and management plan. No signs of spinal; irritation. Pain poorly localized. Worse on internal/external version movement of hip but also tenderness in flank area.  She will need additional imaging to determine etiology.

## 2015-02-14 ENCOUNTER — Ambulatory Visit (INDEPENDENT_AMBULATORY_CARE_PROVIDER_SITE_OTHER): Payer: Medicaid Other | Admitting: Podiatry

## 2015-02-14 ENCOUNTER — Encounter: Payer: Self-pay | Admitting: Podiatry

## 2015-02-14 VITALS — BP 150/91 | HR 60 | Temp 97.5°F | Resp 14

## 2015-02-14 DIAGNOSIS — M79674 Pain in right toe(s): Secondary | ICD-10-CM | POA: Diagnosis not present

## 2015-02-14 DIAGNOSIS — E1142 Type 2 diabetes mellitus with diabetic polyneuropathy: Secondary | ICD-10-CM

## 2015-02-14 DIAGNOSIS — M79675 Pain in left toe(s): Secondary | ICD-10-CM

## 2015-02-14 DIAGNOSIS — G629 Polyneuropathy, unspecified: Secondary | ICD-10-CM

## 2015-02-14 DIAGNOSIS — E1151 Type 2 diabetes mellitus with diabetic peripheral angiopathy without gangrene: Secondary | ICD-10-CM

## 2015-02-14 DIAGNOSIS — Q6689 Other  specified congenital deformities of feet: Secondary | ICD-10-CM

## 2015-02-14 DIAGNOSIS — B351 Tinea unguium: Secondary | ICD-10-CM | POA: Diagnosis not present

## 2015-02-14 DIAGNOSIS — L84 Corns and callosities: Secondary | ICD-10-CM

## 2015-02-14 NOTE — Patient Instructions (Signed)
Wear existing diabetic shoe with additional felt pads attached to the bottom of the insole on the right foot  Diabetes and Foot Care Diabetes may cause you to have problems because of poor blood supply (circulation) to your feet and legs. This may cause the skin on your feet to become thinner, break easier, and heal more slowly. Your skin may become dry, and the skin may peel and crack. You may also have nerve damage in your legs and feet causing decreased feeling in them. You may not notice minor injuries to your feet that could lead to infections or more serious problems. Taking care of your feet is one of the most important things you can do for yourself.  HOME CARE INSTRUCTIONS  Wear shoes at all times, even in the house. Do not go barefoot. Bare feet are easily injured.  Check your feet daily for blisters, cuts, and redness. If you cannot see the bottom of your feet, use a mirror or ask someone for help.  Wash your feet with warm water (do not use hot water) and mild soap. Then pat your feet and the areas between your toes until they are completely dry. Do not soak your feet as this can dry your skin.  Apply a moisturizing lotion or petroleum jelly (that does not contain alcohol and is unscented) to the skin on your feet and to dry, brittle toenails. Do not apply lotion between your toes.  Trim your toenails straight across. Do not dig under them or around the cuticle. File the edges of your nails with an emery board or nail file.  Do not cut corns or calluses or try to remove them with medicine.  Wear clean socks or stockings every day. Make sure they are not too tight. Do not wear knee-high stockings since they may decrease blood flow to your legs.  Wear shoes that fit properly and have enough cushioning. To break in new shoes, wear them for just a few hours a day. This prevents you from injuring your feet. Always look in your shoes before you put them on to be sure there are no objects  inside.  Do not cross your legs. This may decrease the blood flow to your feet.  If you find a minor scrape, cut, or break in the skin on your feet, keep it and the skin around it clean and dry. These areas may be cleansed with mild soap and water. Do not cleanse the area with peroxide, alcohol, or iodine.  When you remove an adhesive bandage, be sure not to damage the skin around it.  If you have a wound, look at it several times a day to make sure it is healing.  Do not use heating pads or hot water bottles. They may burn your skin. If you have lost feeling in your feet or legs, you may not know it is happening until it is too late.  Make sure your health care provider performs a complete foot exam at least annually or more often if you have foot problems. Report any cuts, sores, or bruises to your health care provider immediately. SEEK MEDICAL CARE IF:   You have an injury that is not healing.  You have cuts or breaks in the skin.  You have an ingrown nail.  You notice redness on your legs or feet.  You feel burning or tingling in your legs or feet.  You have pain or cramps in your legs and feet.  Your legs or  feet are numb.  Your feet always feel cold. SEEK IMMEDIATE MEDICAL CARE IF:   There is increasing redness, swelling, or pain in or around a wound.  There is a red line that goes up your leg.  Pus is coming from a wound.  You develop a fever or as directed by your health care provider.  You notice a bad smell coming from an ulcer or wound. Document Released: 09/12/2000 Document Revised: 05/18/2013 Document Reviewed: 02/22/2013 [facility] Patient Information 2015 [place], [place]. This information is not intended to replace advice given to you by your health care provider. Make sure you discuss any questions you have with your health care provider.

## 2015-02-14 NOTE — Progress Notes (Signed)
Patient ID: [name] [name], female   DOB: [birth year], 64 y.o.   MRN: [id number]  Subjective: This patient presents for ongoing debridement of pre-ulcerative plantar callus sub-fifth right MPJ. 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

## 2015-03-05 ENCOUNTER — Ambulatory Visit: Payer: Medicaid Other | Admitting: Podiatry

## 2015-03-06 ENCOUNTER — Ambulatory Visit (INDEPENDENT_AMBULATORY_CARE_PROVIDER_SITE_OTHER): Payer: Medicaid Other | Admitting: Podiatry

## 2015-03-06 ENCOUNTER — Encounter: Payer: Self-pay | Admitting: Podiatry

## 2015-03-06 VITALS — BP 176/90 | HR 60 | Temp 98.6°F | Resp 12

## 2015-03-06 DIAGNOSIS — L84 Corns and callosities: Secondary | ICD-10-CM

## 2015-03-06 DIAGNOSIS — E1151 Type 2 diabetes mellitus with diabetic peripheral angiopathy without gangrene: Secondary | ICD-10-CM | POA: Diagnosis not present

## 2015-03-06 NOTE — Progress Notes (Signed)
Patient ID: [name] [name], female   DOB: 26-Jul-1951, 64 y.o.   MRN: [id number]  Subjective: This patient presents again for debridement of pre-ulcerative plantar callus and callus on the right foot  Objective: BK amputation left Bleeding callus sub-fifth right MPJ that remains closed after debridement Callus distal right hallux  Assessment: Diabetic with peripheral arterial disease and peripheral neuropathy Pre-ulcerative plantar callus right Callus right hallux  Plan: Debridement of calluses 2 without any bleeding Maintain existing diabetic shoe on right foot that has additional felt padding to offload the fifth right MPJ  Reappoint 3 months

## 2015-03-06 NOTE — Patient Instructions (Signed)
Diabetes and Foot Care Diabetes may cause you to have problems because of poor blood supply (circulation) to your feet and legs. This may cause the skin on your feet to become thinner, break easier, and heal more slowly. Your skin may become dry, and the skin may peel and crack. You may also have nerve damage in your legs and feet causing decreased feeling in them. You may not notice minor injuries to your feet that could lead to infections or more serious problems. Taking care of your feet is one of the most important things you can do for yourself.  HOME CARE INSTRUCTIONS  Wear shoes at all times, even in the house. Do not go barefoot. Bare feet are easily injured.  Check your feet daily for blisters, cuts, and redness. If you cannot see the bottom of your feet, use a mirror or ask someone for help.  Wash your feet with warm water (do not use hot water) and mild soap. Then pat your feet and the areas between your toes until they are completely dry. Do not soak your feet as this can dry your skin.  Apply a moisturizing lotion or petroleum jelly (that does not contain alcohol and is unscented) to the skin on your feet and to dry, brittle toenails. Do not apply lotion between your toes.  Trim your toenails straight across. Do not dig under them or around the cuticle. File the edges of your nails with an emery board or nail file.  Do not cut corns or calluses or try to remove them with medicine.  Wear clean socks or stockings every day. Make sure they are not too tight. Do not wear knee-high stockings since they may decrease blood flow to your legs.  Wear shoes that fit properly and have enough cushioning. To break in new shoes, wear them for just a few hours a day. This prevents you from injuring your feet. Always look in your shoes before you put them on to be sure there are no objects inside.  Do not cross your legs. This may decrease the blood flow to your feet.  If you find a minor scrape,  cut, or break in the skin on your feet, keep it and the skin around it clean and dry. These areas may be cleansed with mild soap and water. Do not cleanse the area with peroxide, alcohol, or iodine.  When you remove an adhesive bandage, be sure not to damage the skin around it.  If you have a wound, look at it several times a day to make sure it is healing.  Do not use heating pads or hot water bottles. They may burn your skin. If you have lost feeling in your feet or legs, you may not know it is happening until it is too late.  Make sure your health care provider performs a complete foot exam at least annually or more often if you have foot problems. Report any cuts, sores, or bruises to your health care provider immediately. SEEK MEDICAL CARE IF:   You have an injury that is not healing.  You have cuts or breaks in the skin.  You have an ingrown nail.  You notice redness on your legs or feet.  You feel burning or tingling in your legs or feet.  You have pain or cramps in your legs and feet.  Your legs or feet are numb.  Your feet always feel cold. SEEK IMMEDIATE MEDICAL CARE IF:   There is increasing redness,   swelling, or pain in or around a wound.  There is a red line that goes up your leg.  Pus is coming from a wound.  You develop a fever or as directed by your health care provider.  You notice a bad smell coming from an ulcer or wound. Document Released: 09/12/2000 Document Revised: 05/18/2013 Document Reviewed: 02/22/2013 ExitCare Patient Information 2015 [employer]. This information is not intended to replace advice given to you by your health care provider. Make sure you discuss any questions you have with your health care provider.  

## 2015-03-27 ENCOUNTER — Ambulatory Visit: Payer: Medicaid Other | Admitting: Podiatry

## 2015-04-03 ENCOUNTER — Encounter: Payer: Self-pay | Admitting: Podiatry

## 2015-04-03 ENCOUNTER — Telehealth: Payer: Self-pay | Admitting: *Deleted

## 2015-04-03 ENCOUNTER — Ambulatory Visit (INDEPENDENT_AMBULATORY_CARE_PROVIDER_SITE_OTHER): Payer: Medicaid Other | Admitting: Podiatry

## 2015-04-03 VITALS — BP 167/87 | HR 60 | Temp 98.1°F | Resp 16

## 2015-04-03 DIAGNOSIS — L89600 Pressure ulcer of unspecified heel, unstageable: Secondary | ICD-10-CM

## 2015-04-03 DIAGNOSIS — L97511 Non-pressure chronic ulcer of other part of right foot limited to breakdown of skin: Secondary | ICD-10-CM

## 2015-04-03 MED ORDER — SILVER SULFADIAZINE 1 % EX CREA: 1.0000 "application " | TOPICAL_CREAM | Freq: Every day | CUTANEOUS | Status: DC

## 2015-04-03 NOTE — Patient Instructions (Signed)
Apply Silvadene cream to skin ulcer on the right foot daily and cover with gauze Limit standing and walking is much as possible Avoid using prosthetic limb

## 2015-04-03 NOTE — Progress Notes (Signed)
   Subjective:    Patient ID: [name] [name], female    DOB: [birth year], 64 y.o.   MRN: [id number]  HPI  "It's doing alright."  This patient presents for follow-up care for pre-ulcerative in ulcerative plantar callus on the right foot  Review of Systems     Objective:   Physical Exam  Objective: BK amputation left  Right foot Large hemorrhagic plantar callus sub-fifth MPJ right that after debridement brace down to a superficial ulceration measuring 5 x 3 mm with slight serous drainage. There is no surrounding erythema, edema, warmth or malodor. The base as a granular appearance      Assessment & Plan:   Assessment: Noninfected plantar diabetic skin ulcer fifth right MPJ  Plan: Debrided ulcer and dressed with Silvadene cream Patient advised that she has an open wound and she will begin to apply Silvadene cream and gauze dressing to the area on a daily basis.  I advised her to use her wheelchair is much as possible and not to wear her prosthetic limb on the left  Reappoint 7 days

## 2015-04-03 NOTE — Telephone Encounter (Signed)
Pt states she can't find her appt card.

## 2015-04-10 ENCOUNTER — Encounter: Payer: Self-pay | Admitting: Podiatry

## 2015-04-10 ENCOUNTER — Ambulatory Visit (INDEPENDENT_AMBULATORY_CARE_PROVIDER_SITE_OTHER): Payer: Medicaid Other | Admitting: Podiatry

## 2015-04-10 VITALS — BP 161/75 | HR 60 | Temp 97.2°F | Resp 12

## 2015-04-10 DIAGNOSIS — Q828 Other specified congenital malformations of skin: Secondary | ICD-10-CM | POA: Diagnosis not present

## 2015-04-10 DIAGNOSIS — M79673 Pain in unspecified foot: Secondary | ICD-10-CM

## 2015-04-10 DIAGNOSIS — E1151 Type 2 diabetes mellitus with diabetic peripheral angiopathy without gangrene: Secondary | ICD-10-CM | POA: Diagnosis not present

## 2015-04-10 DIAGNOSIS — B351 Tinea unguium: Secondary | ICD-10-CM | POA: Diagnosis not present

## 2015-04-10 DIAGNOSIS — L84 Corns and callosities: Secondary | ICD-10-CM

## 2015-04-10 NOTE — Patient Instructions (Signed)
Stop using Silvadene cream on the right foot skin ulcer Limit standing and walking Usual wheelchair Do not usual prosthetic limb  Diabetes and Foot Care Diabetes may cause you to have problems because of poor blood supply (circulation) to your feet and legs. This may cause the skin on your feet to become thinner, break easier, and heal more slowly. Your skin may become dry, and the skin may peel and crack. You may also have nerve damage in your legs and feet causing decreased feeling in them. You may not notice minor injuries to your feet that could lead to infections or more serious problems. Taking care of your feet is one of the most important things you can do for yourself.  HOME CARE INSTRUCTIONS  Wear shoes at all times, even in the house. Do not go barefoot. Bare feet are easily injured.  Check your feet daily for blisters, cuts, and redness. If you cannot see the bottom of your feet, use a mirror or ask someone for help.  Wash your feet with warm water (do not use hot water) and mild soap. Then pat your feet and the areas between your toes until they are completely dry. Do not soak your feet as this can dry your skin.  Apply a moisturizing lotion or petroleum jelly (that does not contain alcohol and is unscented) to the skin on your feet and to dry, brittle toenails. Do not apply lotion between your toes.  Trim your toenails straight across. Do not dig under them or around the cuticle. File the edges of your nails with an emery board or nail file.  Do not cut corns or calluses or try to remove them with medicine.  Wear clean socks or stockings every day. Make sure they are not too tight. Do not wear knee-high stockings since they may decrease blood flow to your legs.  Wear shoes that fit properly and have enough cushioning. To break in new shoes, wear them for just a few hours a day. This prevents you from injuring your feet. Always look in your shoes before you put them on to be sure  there are no objects inside.  Do not cross your legs. This may decrease the blood flow to your feet.  If you find a minor scrape, cut, or break in the skin on your feet, keep it and the skin around it clean and dry. These areas may be cleansed with mild soap and water. Do not cleanse the area with peroxide, alcohol, or iodine.  When you remove an adhesive bandage, be sure not to damage the skin around it.  If you have a wound, look at it several times a day to make sure it is healing.  Do not use heating pads or hot water bottles. They may burn your skin. If you have lost feeling in your feet or legs, you may not know it is happening until it is too late.  Make sure your health care provider performs a complete foot exam at least annually or more often if you have foot problems. Report any cuts, sores, or bruises to your health care provider immediately. SEEK MEDICAL CARE IF:   You have an injury that is not healing.  You have cuts or breaks in the skin.  You have an ingrown nail.  You notice redness on your legs or feet.  You feel burning or tingling in your legs or feet.  You have pain or cramps in your legs and feet.  Your  legs or feet are numb.  Your feet always feel cold. SEEK IMMEDIATE MEDICAL CARE IF:   There is increasing redness, swelling, or pain in or around a wound.  There is a red line that goes up your leg.  Pus is coming from a wound.  You develop a fever or as directed by your health care provider.  You notice a bad smell coming from an ulcer or wound. Document Released: 09/12/2000 Document Revised: 05/18/2013 Document Reviewed: 02/22/2013 [facility] Patient Information 2015 [place], [place]. This information is not intended to replace advice given to you by your health care provider. Make sure you discuss any questions you have with your health care provider.

## 2015-04-10 NOTE — Progress Notes (Signed)
Patient ID: [name] [name], female   DOB: 1951-05-18, 64 y.o.   MRN: [id number]  Subjective: This patient presents for ongoing care for ulcerative and pre-ulcerative skin lesion on the plantar aspect of right foot. On the visit of 04/03/2015 a superficial ulcer was treated with debridement, Silvadene cream and offloading  Objective: BK amputation left  Right foot Small amount hemorrhagic callus sub-fifth right MPJ that remains closed after debridement. 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

## 2015-04-24 ENCOUNTER — Encounter: Payer: Self-pay | Admitting: Podiatry

## 2015-04-24 ENCOUNTER — Ambulatory Visit (INDEPENDENT_AMBULATORY_CARE_PROVIDER_SITE_OTHER): Payer: Medicaid Other | Admitting: Podiatry

## 2015-04-24 VITALS — BP 178/100 | HR 88 | Temp 97.8°F | Resp 12

## 2015-04-24 DIAGNOSIS — L84 Corns and callosities: Secondary | ICD-10-CM | POA: Diagnosis not present

## 2015-04-24 DIAGNOSIS — E1151 Type 2 diabetes mellitus with diabetic peripheral angiopathy without gangrene: Secondary | ICD-10-CM | POA: Diagnosis not present

## 2015-04-24 NOTE — Progress Notes (Signed)
Patient ID: [name] [name], female   DOB: 03/28/1951, 64 y.o.   MRN: [id number]  Subjective: This patient presents for ongoing care for pre-ulcerative in ulcerative skin lesions in the plantar aspect of right foot. On the visit of 04/03/2015 a superficial ulcer developed sub-fifth right MPJ which was treated with local wound care and application of Silvadene cream and reduction of standing and walking.  Objective: BK amputation left Right foot has hemorrhagic keratoses sub-fifth right MPJ that remains closed after debridement. Distal right hallux as non-bleeding callus  Assessment: Diabetic with peripheral vascular disease Please ulcerative plantar callus sub-fifth right MPJ without a clinical sign of infection  Plan: Debrided plantar callus fifth MPJ and distal right hallux without any bleeding Patient advised to wear her existing diabetic shoe with custom insole Reduce the amount of standing walking Avoid using prosthetic limb Use wheelchair is much as possible  Reappoint 2 weeks

## 2015-04-24 NOTE — Patient Instructions (Signed)
Diabetes and Foot Care Diabetes may cause you to have problems because of poor blood supply (circulation) to your feet and legs. This may cause the skin on your feet to become thinner, break easier, and heal more slowly. Your skin may become dry, and the skin may peel and crack. You may also have nerve damage in your legs and feet causing decreased feeling in them. You may not notice minor injuries to your feet that could lead to infections or more serious problems. Taking care of your feet is one of the most important things you can do for yourself.  HOME CARE INSTRUCTIONS  Wear shoes at all times, even in the house. Do not go barefoot. Bare feet are easily injured.  Check your feet daily for blisters, cuts, and redness. If you cannot see the bottom of your feet, use a mirror or ask someone for help.  Wash your feet with warm water (do not use hot water) and mild soap. Then pat your feet and the areas between your toes until they are completely dry. Do not soak your feet as this can dry your skin.  Apply a moisturizing lotion or petroleum jelly (that does not contain alcohol and is unscented) to the skin on your feet and to dry, brittle toenails. Do not apply lotion between your toes.  Trim your toenails straight across. Do not dig under them or around the cuticle. File the edges of your nails with an emery board or nail file.  Do not cut corns or calluses or try to remove them with medicine.  Wear clean socks or stockings every day. Make sure they are not too tight. Do not wear knee-high stockings since they may decrease blood flow to your legs.  Wear shoes that fit properly and have enough cushioning. To break in new shoes, wear them for just a few hours a day. This prevents you from injuring your feet. Always look in your shoes before you put them on to be sure there are no objects inside.  Do not cross your legs. This may decrease the blood flow to your feet.  If you find a minor scrape,  cut, or break in the skin on your feet, keep it and the skin around it clean and dry. These areas may be cleansed with mild soap and water. Do not cleanse the area with peroxide, alcohol, or iodine.  When you remove an adhesive bandage, be sure not to damage the skin around it.  If you have a wound, look at it several times a day to make sure it is healing.  Do not use heating pads or hot water bottles. They may burn your skin. If you have lost feeling in your feet or legs, you may not know it is happening until it is too late.  Make sure your health care provider performs a complete foot exam at least annually or more often if you have foot problems. Report any cuts, sores, or bruises to your health care provider immediately. SEEK MEDICAL CARE IF:   You have an injury that is not healing.  You have cuts or breaks in the skin.  You have an ingrown nail.  You notice redness on your legs or feet.  You feel burning or tingling in your legs or feet.  You have pain or cramps in your legs and feet.  Your legs or feet are numb.  Your feet always feel cold. SEEK IMMEDIATE MEDICAL CARE IF:   There is increasing redness,   swelling, or pain in or around a wound.  There is a red line that goes up your leg.  Pus is coming from a wound.  You develop a fever or as directed by your health care provider.  You notice a bad smell coming from an ulcer or wound. Document Released: 09/12/2000 Document Revised: 05/18/2013 Document Reviewed: 02/22/2013 ExitCare Patient Information 2015 [employer]. This information is not intended to replace advice given to you by your health care provider. Make sure you discuss any questions you have with your health care provider.  

## 2015-05-08 ENCOUNTER — Ambulatory Visit: Payer: Medicaid Other | Admitting: Podiatry

## 2015-05-30 ENCOUNTER — Telehealth: Payer: Self-pay | Admitting: *Deleted

## 2015-05-30 ENCOUNTER — Ambulatory Visit: Payer: Medicaid Other | Admitting: Podiatry

## 2015-05-30 NOTE — Telephone Encounter (Signed)
Pt states Transportation goofed her up and she needed another appt.  Referred to schedulers.

## 2015-05-31 ENCOUNTER — Telehealth: Payer: Self-pay | Admitting: *Deleted

## 2015-05-31 NOTE — Telephone Encounter (Signed)
Pt states she lost her appt card, please call.

## 2015-06-06 ENCOUNTER — Ambulatory Visit (INDEPENDENT_AMBULATORY_CARE_PROVIDER_SITE_OTHER): Payer: Medicaid Other | Admitting: Podiatry

## 2015-06-06 ENCOUNTER — Encounter: Payer: Self-pay | Admitting: Podiatry

## 2015-06-06 VITALS — BP 141/73 | HR 60 | Temp 97.5°F | Resp 12

## 2015-06-06 DIAGNOSIS — L84 Corns and callosities: Secondary | ICD-10-CM | POA: Diagnosis not present

## 2015-06-06 DIAGNOSIS — E1151 Type 2 diabetes mellitus with diabetic peripheral angiopathy without gangrene: Secondary | ICD-10-CM | POA: Diagnosis not present

## 2015-06-06 DIAGNOSIS — B351 Tinea unguium: Secondary | ICD-10-CM

## 2015-06-06 DIAGNOSIS — M79674 Pain in right toe(s): Secondary | ICD-10-CM | POA: Diagnosis not present

## 2015-06-06 NOTE — Progress Notes (Signed)
Patient ID: [name] [name], female   DOB: 03/27/1951, 64 y.o.   MRN: [id number]  Subjective: This patient presents for ongoing care for pre-ulcerative callus as well as nail debridement  Objective: BK amputation left Well-organized hemorrhagic callus sub-fifth right MPJ that remains closed after debridement Small distal keratoses right hallux The toenails are hypertrophic, brittle, incurvated, 1-5  Assessment: Diabetic with peripheral vascular disease Pre-ulcerative plantar callus right Mycotic toenails 5  Plan: Debride plantar callus without a bleeding Debrided toenails 1-5 mechanically and electrically without a bleeding  Patient will continue to wear diabetic shoe with custom insole an additional felt pad to offload the plantar fifth right MPJ  Reappoint 6 weeks

## 2015-06-06 NOTE — Patient Instructions (Signed)
Diabetes and Foot Care Diabetes may cause you to have problems because of poor blood supply (circulation) to your feet and legs. This may cause the skin on your feet to become thinner, break easier, and heal more slowly. Your skin may become dry, and the skin may peel and crack. You may also have nerve damage in your legs and feet causing decreased feeling in them. You may not notice minor injuries to your feet that could lead to infections or more serious problems. Taking care of your feet is one of the most important things you can do for yourself.  HOME CARE INSTRUCTIONS  Wear shoes at all times, even in the house. Do not go barefoot. Bare feet are easily injured.  Check your feet daily for blisters, cuts, and redness. If you cannot see the bottom of your feet, use a mirror or ask someone for help.  Wash your feet with warm water (do not use hot water) and mild soap. Then pat your feet and the areas between your toes until they are completely dry. Do not soak your feet as this can dry your skin.  Apply a moisturizing lotion or petroleum jelly (that does not contain alcohol and is unscented) to the skin on your feet and to dry, brittle toenails. Do not apply lotion between your toes.  Trim your toenails straight across. Do not dig under them or around the cuticle. File the edges of your nails with an emery board or nail file.  Do not cut corns or calluses or try to remove them with medicine.  Wear clean socks or stockings every day. Make sure they are not too tight. Do not wear knee-high stockings since they may decrease blood flow to your legs.  Wear shoes that fit properly and have enough cushioning. To break in new shoes, wear them for just a few hours a day. This prevents you from injuring your feet. Always look in your shoes before you put them on to be sure there are no objects inside.  Do not cross your legs. This may decrease the blood flow to your feet.  If you find a minor scrape,  cut, or break in the skin on your feet, keep it and the skin around it clean and dry. These areas may be cleansed with mild soap and water. Do not cleanse the area with peroxide, alcohol, or iodine.  When you remove an adhesive bandage, be sure not to damage the skin around it.  If you have a wound, look at it several times a day to make sure it is healing.  Do not use heating pads or hot water bottles. They may burn your skin. If you have lost feeling in your feet or legs, you may not know it is happening until it is too late.  Make sure your health care provider performs a complete foot exam at least annually or more often if you have foot problems. Report any cuts, sores, or bruises to your health care provider immediately. SEEK MEDICAL CARE IF:   You have an injury that is not healing.  You have cuts or breaks in the skin.  You have an ingrown nail.  You notice redness on your legs or feet.  You feel burning or tingling in your legs or feet.  You have pain or cramps in your legs and feet.  Your legs or feet are numb.  Your feet always feel cold. SEEK IMMEDIATE MEDICAL CARE IF:   There is increasing redness,   swelling, or pain in or around a wound.  There is a red line that goes up your leg.  Pus is coming from a wound.  You develop a fever or as directed by your health care provider.  You notice a bad smell coming from an ulcer or wound. Document Released: 09/12/2000 Document Revised: 05/18/2013 Document Reviewed: 02/22/2013 ExitCare Patient Information 2015 [employer]. This information is not intended to replace advice given to you by your health care provider. Make sure you discuss any questions you have with your health care provider.  

## 2015-06-20 ENCOUNTER — Other Ambulatory Visit: Payer: Self-pay | Admitting: Internal Medicine

## 2015-06-20 MED ORDER — TRAMADOL HCL 50 MG PO TABS: 50.0000 mg | ORAL_TABLET | Freq: Four times a day (QID) | ORAL | Status: DC | PRN

## 2015-06-20 NOTE — Telephone Encounter (Signed)
Patient requesting refill for a pain medication. Pt moved appt to 07/04/15. Soonest appt her dr had

## 2015-06-20 NOTE — Telephone Encounter (Signed)
Last filled 5/4 Pt has appointment 10/5

## 2015-06-21 NOTE — Telephone Encounter (Signed)
Rx called in 

## 2015-06-25 ENCOUNTER — Other Ambulatory Visit: Payer: Self-pay | Admitting: Internal Medicine

## 2015-06-27 NOTE — Telephone Encounter (Signed)
Tried to call, no answer

## 2015-07-03 ENCOUNTER — Ambulatory Visit: Payer: Medicaid Other | Admitting: Podiatry

## 2015-07-04 ENCOUNTER — Encounter: Payer: Self-pay | Admitting: Internal Medicine

## 2015-07-04 ENCOUNTER — Ambulatory Visit (INDEPENDENT_AMBULATORY_CARE_PROVIDER_SITE_OTHER): Payer: Medicaid Other | Admitting: Internal Medicine

## 2015-07-04 VITALS — BP 177/71 | HR 60 | Temp 98.2°F | Ht 62.0 in | Wt 120.1 lb

## 2015-07-04 DIAGNOSIS — N183 Type 2 diabetes mellitus with diabetic chronic kidney disease: Secondary | ICD-10-CM

## 2015-07-04 DIAGNOSIS — Z89512 Acquired absence of left leg below knee: Secondary | ICD-10-CM

## 2015-07-04 DIAGNOSIS — G546 Phantom limb syndrome with pain: Secondary | ICD-10-CM

## 2015-07-04 DIAGNOSIS — Z23 Encounter for immunization: Secondary | ICD-10-CM | POA: Diagnosis not present

## 2015-07-04 DIAGNOSIS — I129 Hypertensive chronic kidney disease with stage 1 through stage 4 chronic kidney disease, or unspecified chronic kidney disease: Secondary | ICD-10-CM

## 2015-07-04 DIAGNOSIS — N189 Chronic kidney disease, unspecified: Secondary | ICD-10-CM | POA: Diagnosis not present

## 2015-07-04 DIAGNOSIS — E559 Vitamin D deficiency, unspecified: Secondary | ICD-10-CM

## 2015-07-04 DIAGNOSIS — I10 Essential (primary) hypertension: Secondary | ICD-10-CM

## 2015-07-04 DIAGNOSIS — Z79899 Other long term (current) drug therapy: Secondary | ICD-10-CM

## 2015-07-04 DIAGNOSIS — E1122 Type 2 diabetes mellitus with diabetic chronic kidney disease: Secondary | ICD-10-CM

## 2015-07-04 DIAGNOSIS — Z0000 Encounter for general adult medical examination without abnormal findings: Secondary | ICD-10-CM

## 2015-07-04 LAB — GLUCOSE, CAPILLARY: GLUCOSE-CAPILLARY: 79 mg/dL (ref 65–99)

## 2015-07-04 LAB — POCT GLYCOSYLATED HEMOGLOBIN (HGB A1C): Hemoglobin A1C: 5.1

## 2015-07-04 MED ORDER — LISINOPRIL 10 MG PO TABS: 10.0000 mg | ORAL_TABLET | Freq: Every day | ORAL | Status: DC

## 2015-07-04 MED ORDER — ZOSTER VACCINE LIVE 19400 UNT/0.65ML ~~LOC~~ SOLR: 0.6500 mL | Freq: Once | SUBCUTANEOUS | Status: DC

## 2015-07-04 MED ORDER — OXYCODONE HCL 5 MG PO TABS: 5.0000 mg | ORAL_TABLET | Freq: Four times a day (QID) | ORAL | Status: DC | PRN

## 2015-07-04 NOTE — Progress Notes (Signed)
   Subjective:    Patient ID: [name] [name], female    DOB: 30-Apr-1951, 64 y.o.   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 Please see problem list for further details.      Review of Systems  Constitutional: Negative for fever and activity change.  Respiratory: Negative for shortness of breath.   Gastrointestinal: Negative for diarrhea and blood in stool.  Musculoskeletal:       Neg for wrist pain, left BKA stump pain    Psychiatric/Behavioral: Negative for decreased concentration (denies depression).       Objective:   Physical Exam  Constitutional: She appears well-developed and well-nourished. No distress.  HENT:  Head: Normocephalic.  Cardiovascular: Normal rate, regular rhythm and normal heart sounds.   No murmur heard. Pulmonary/Chest: Effort normal and breath sounds normal. No respiratory distress. She has no wheezes. She has no rales.  Abdominal: Soft. Bowel sounds are normal. She exhibits no distension. There is no tenderness.  Neurological: She is alert.  Skin: Skin is warm and dry.  Left BKA stump tender to palpation, darkened skin changes on bottom surfaces without any ulcerations, scabbing, or calluses. Neg for fluctuance or erythema.            Assessment & Plan:  Please see problem based assessment and plan.

## 2015-07-04 NOTE — Patient Instructions (Signed)
Start taking lisinopril 10mg  once a day for your blood pressure. Do not take this medication if you feel sick and are not eating and drinking well.   You can take oxycodone 5mg  every 6 hours for pain as needed. Try not to take this if you are not in pain. I have also put in a referral for pain management clinic and for a mammogram.   We will need to see you in 2 weeks to recheck your blood labs since we are starting you on a new blood pressure medication.

## 2015-07-05 ENCOUNTER — Telehealth: Payer: Self-pay | Admitting: *Deleted

## 2015-07-05 LAB — BMP8+ANION GAP
Anion Gap: 16 mmol/L (ref 10.0–18.0)
BUN/Creatinine Ratio: 7 — ABNORMAL LOW (ref 11–26)
BUN: 13 mg/dL (ref 8–27)
CO2: 24 mmol/L (ref 18–29)
Calcium: 9.3 mg/dL (ref 8.7–10.3)
Chloride: 105 mmol/L (ref 97–108)
Creatinine, Ser: 1.75 mg/dL — ABNORMAL HIGH (ref 0.57–1.00)
GFR calc Af Amer: 35 mL/min/{1.73_m2} — ABNORMAL LOW (ref >59)
GFR, EST NON AFRICAN AMERICAN: 31 mL/min/{1.73_m2} — AB (ref >59)
Glucose: 79 mg/dL (ref 65–99)
Potassium: 4.4 mmol/L (ref 3.5–5.2)
SODIUM: 145 mmol/L — AB (ref 134–144)

## 2015-07-05 LAB — MICROALBUMIN / CREATININE URINE RATIO
Creatinine, Urine: 100.3 mg/dL
MICROALB/CREAT RATIO: 437.5 mg/g creat — ABNORMAL HIGH (ref 0.0–30.0)
Microalbumin, Urine: 438.8 ug/mL

## 2015-07-05 LAB — VITAMIN D 25 HYDROXY (VIT D DEFICIENCY, FRACTURES): Vit D, 25-Hydroxy: 16.5 ng/mL — ABNORMAL LOW (ref 30.0–100.0)

## 2015-07-05 NOTE — Assessment & Plan Note (Addendum)
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 She has chronic left stump pain that she states is a bone pain. There are skin changes as noted in PE w/o any signs of fluctuance or infection. She is tender to palpation beneath left stump. She has tried tramadol without relief. She is also on gabapentin 400mg  TID and is refusing to go up on the dose for pain, she is also refusing voltaren gel. She is specifically asking for hydrocodone b/c her prosthetic technician instructed her that her PCP would give it to her. Per chart review she was previously given pain medications by Dr. Naaman Plummer who then discharged her in 2014 due to inconsistent UDS and confusion while on pain medications. Specifically her UDS was positive for alcohol, THC, and tramadol. It was negative for oxycodone, she was then discharged.  Thus in 2015 she was advised to take tylenol for pain from provider at this clinic.   - rx given for oxycodone q6h prn, #112 tabs. Instructed pt to take as needed and not every 6 hours. Also informed pt that THC will not be tolerated on UDS if she is offered a pain contract here.  - pain management referral placed

## 2015-07-05 NOTE — Assessment & Plan Note (Signed)
BP Readings from Last 3 Encounters:  07/04/15 177/71  06/06/15 141/73  04/24/15 178/100    Lab Results  Component Value Date   NA 145* 07/04/2015   K 4.4 07/04/2015   CREATININE 1.75* 07/04/2015    Assessment: Blood pressure control:  not controlled Progress toward BP goal:   not at goal Comments:on bidil, lasix 40, and coreg 12.5mg  BID. Her creatinine is around baseline of 1.64 today.   Plan: Medications:  continue current medications and add lisinopril 10mg  daily to regimen w/ hx of CKD. If she does not tolerate lisinopril can consider clonidine which will also help with her left stump pain.  Educational resources provided: brochure (REFUSED) Self management tools provided:   Other plans: f/u for BP check and BMET in 1 month

## 2015-07-05 NOTE — Assessment & Plan Note (Signed)
Referral for mammoram placed and rx for zostavax sent in. Getting an eye exam next month w/ Dr. Elzie Rings.

## 2015-07-05 NOTE — Assessment & Plan Note (Signed)
Vit D today low at 16.5. She was non compliant with VIt D 50,000 units due to cost. Will send Edwena Blow a message for medication assistance for this and reassess at next visit in 1 month.

## 2015-07-05 NOTE — Assessment & Plan Note (Addendum)
Lab Results  Component Value Date   HGBA1C 5.1 07/04/2015   HGBA1C 5.1 11/02/2014   HGBA1C 5.1 05/31/2014     Assessment: Diabetes control:  controlled Progress toward A1C goal:   at goal Comments: diet controlled, does not check her sugars at home.   Plan:Her HgbA1cs have been less than 7 since 2008 which would be her goal. DM is diet controlled. Reasonable to follow HgbA1c annually. Checking urine microalbumin today.   Educational resources provided:  (REFUSED)

## 2015-07-05 NOTE — Telephone Encounter (Signed)
Form letter Request For A Resasonable Accommodation received, pt had completed her portion, I completed the [place] portion and mailed to [facility], [address] [place], [id number].

## 2015-07-06 NOTE — Progress Notes (Signed)
Medicine attending: Medical history, presenting problems, physical findings, and medications, reviewed with Dr Julious Oka on the day of the patient visit and I concur with her evaluation and management plan.

## 2015-07-09 ENCOUNTER — Telehealth: Payer: Self-pay | Admitting: *Deleted

## 2015-07-09 NOTE — Telephone Encounter (Signed)
CALLED AND SPOKE WITH PATIENT REGARDING HER LAST EYE APPOINTMENT. PATIENT STATES SHE LAST SAW DR Ricki Miller ON [address] IN 2015. SHE SAYS SHE HAS NOT HAD EYE EXAM FOR THE YEAR 2016. DR Ricki Miller HAS RETIRED FROM PRACTICE.

## 2015-07-11 ENCOUNTER — Telehealth: Payer: Self-pay | Admitting: Pharmacist

## 2015-07-11 ENCOUNTER — Telehealth: Payer: Self-pay | Admitting: *Deleted

## 2015-07-11 DIAGNOSIS — E559 Vitamin D deficiency, unspecified: Secondary | ICD-10-CM

## 2015-07-11 MED ORDER — ERGOCALCIFEROL 1.25 MG (50000 UT) PO CAPS: 50000.0000 [IU] | ORAL_CAPSULE | ORAL | Status: DC

## 2015-07-11 NOTE — Telephone Encounter (Signed)
PT CALLED WITH NEW PHONE NUMBER [phone].

## 2015-07-11 NOTE — Telephone Encounter (Signed)
Patient stated she had been unable to afford vitamin D. Called in Rx to [facility] to price-check with pharmacist which ran through [facility] for $3. Contacted patient to clarify whether she would be able to pay the copay, unable to reach.

## 2015-07-18 ENCOUNTER — Encounter: Payer: Medicaid Other | Admitting: Internal Medicine

## 2015-07-18 ENCOUNTER — Encounter: Payer: Self-pay | Admitting: Podiatry

## 2015-07-18 ENCOUNTER — Ambulatory Visit (INDEPENDENT_AMBULATORY_CARE_PROVIDER_SITE_OTHER): Payer: Medicaid Other | Admitting: Podiatry

## 2015-07-18 VITALS — BP 162/86 | HR 62 | Resp 12

## 2015-07-18 DIAGNOSIS — E1151 Type 2 diabetes mellitus with diabetic peripheral angiopathy without gangrene: Secondary | ICD-10-CM

## 2015-07-18 DIAGNOSIS — Q828 Other specified congenital malformations of skin: Secondary | ICD-10-CM

## 2015-07-18 DIAGNOSIS — L84 Corns and callosities: Secondary | ICD-10-CM

## 2015-07-18 NOTE — Patient Instructions (Signed)
Continue to wear diabetic shoe with custom insert with additional padding on the right foot  Diabetes and Foot Care Diabetes may cause you to have problems because of poor blood supply (circulation) to your feet and legs. This may cause the skin on your feet to become thinner, break easier, and heal more slowly. Your skin may become dry, and the skin may peel and crack. You may also have nerve damage in your legs and feet causing decreased feeling in them. You may not notice minor injuries to your feet that could lead to infections or more serious problems. Taking care of your feet is one of the most important things you can do for yourself.  HOME CARE INSTRUCTIONS  Wear shoes at all times, even in the house. Do not go barefoot. Bare feet are easily injured.  Check your feet daily for blisters, cuts, and redness. If you cannot see the bottom of your feet, use a mirror or ask someone for help.  Wash your feet with warm water (do not use hot water) and mild soap. Then pat your feet and the areas between your toes until they are completely dry. Do not soak your feet as this can dry your skin.  Apply a moisturizing lotion or petroleum jelly (that does not contain alcohol and is unscented) to the skin on your feet and to dry, brittle toenails. Do not apply lotion between your toes.  Trim your toenails straight across. Do not dig under them or around the cuticle. File the edges of your nails with an emery board or nail file.  Do not cut corns or calluses or try to remove them with medicine.  Wear clean socks or stockings every day. Make sure they are not too tight. Do not wear knee-high stockings since they may decrease blood flow to your legs.  Wear shoes that fit properly and have enough cushioning. To break in new shoes, wear them for just a few hours a day. This prevents you from injuring your feet. Always look in your shoes before you put them on to be sure there are no objects inside.  Do not  cross your legs. This may decrease the blood flow to your feet.  If you find a minor scrape, cut, or break in the skin on your feet, keep it and the skin around it clean and dry. These areas may be cleansed with mild soap and water. Do not cleanse the area with peroxide, alcohol, or iodine.  When you remove an adhesive bandage, be sure not to damage the skin around it.  If you have a wound, look at it several times a day to make sure it is healing.  Do not use heating pads or hot water bottles. They may burn your skin. If you have lost feeling in your feet or legs, you may not know it is happening until it is too late.  Make sure your health care provider performs a complete foot exam at least annually or more often if you have foot problems. Report any cuts, sores, or bruises to your health care provider immediately. SEEK MEDICAL CARE IF:   You have an injury that is not healing.  You have cuts or breaks in the skin.  You have an ingrown nail.  You notice redness on your legs or feet.  You feel burning or tingling in your legs or feet.  You have pain or cramps in your legs and feet.  Your legs or feet are numb.  Your feet always feel cold. SEEK IMMEDIATE MEDICAL CARE IF:   There is increasing redness, swelling, or pain in or around a wound.  There is a red line that goes up your leg.  Pus is coming from a wound.  You develop a fever or as directed by your health care provider.  You notice a bad smell coming from an ulcer or wound.   This information is not intended to replace advice given to you by your health care provider. Make sure you discuss any questions you have with your health care provider.   Document Released: 09/12/2000 Document Revised: 05/18/2013 Document Reviewed: 02/22/2013 Elsevier Interactive Patient Education [employer].

## 2015-07-18 NOTE — Progress Notes (Signed)
Patient ID: [name] [name], female   DOB: [birth year], 64 y.o.   MRN: [id number]  Subjective: This patient presents for ongoing palliative care for pre-ulcerative plantar callus on right foot  Objective: BK amputation left Large plantar callus fifth MPJ right with central area of dried blood within the callused area. After debridement there is no breakdown of the tissues. There is no surrounding erythema, edema, warmth Small keratoses distal hallux  Assessment: Diabetic peripheral vascular disease Pre-ulcerative plantar callus right fifth MPJ  Plan: Debride plantar callus right without any bleeding Patient instructed to continue wearing diabetic shoe with custom insole an additional felt pad to offload the plantar fifth right MPJ area  Reappoint 6 with

## 2015-08-01 ENCOUNTER — Encounter: Payer: Medicaid Other | Admitting: Internal Medicine

## 2015-08-01 ENCOUNTER — Encounter: Payer: Self-pay | Admitting: Internal Medicine

## 2015-08-01 ENCOUNTER — Ambulatory Visit (INDEPENDENT_AMBULATORY_CARE_PROVIDER_SITE_OTHER): Payer: Medicaid Other | Admitting: Internal Medicine

## 2015-08-01 VITALS — BP 184/75 | HR 60 | Temp 98.3°F | Wt 120.8 lb

## 2015-08-01 DIAGNOSIS — M79609 Pain in unspecified limb: Secondary | ICD-10-CM

## 2015-08-01 DIAGNOSIS — Z89512 Acquired absence of left leg below knee: Secondary | ICD-10-CM

## 2015-08-01 DIAGNOSIS — T8789 Other complications of amputation stump: Secondary | ICD-10-CM

## 2015-08-01 MED ORDER — OXYCODONE-ACETAMINOPHEN 5-325 MG PO TABS: 1.0000 | ORAL_TABLET | Freq: Four times a day (QID) | ORAL | Status: DC | PRN

## 2015-08-01 NOTE — Patient Instructions (Signed)
1. Please return to see Dr. Hulen Luster next week to discuss more ways to manage your stump pain.   2. Please take all medications as prescribed.     3. If you have worsening of your symptoms or new symptoms arise, please call the clinic [phone] or go to the ER immediately if symptoms are severe.
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
## 2015-08-01 NOTE — Assessment & Plan Note (Addendum)
Stump looks good, no sign of infection.  She says she is specifically here for pain med refill because she was told she had to be seen to get med rx.  She was prescribed oxycodone 5mg  q6h prn #112, no refills by PCP 1 month ago for this same problem.  Not currently under pain contract and reportedly has hx of inconsistent UDS (per PCP last note).  She says she will not take gabapentin because she feels it does not help and that oxycodone works the best and she needs to take it q6h.  She tells me she ran out of current rx last week.  She denies current illicit drug use.  Review of [place] narcotic database (for past 6 months) does not reveal red flag behavior.  She is irritated that I will only prescribe small amount of pills.  She says she does not want to travel up and down to the doctor's office.  She ultimately agrees to the 30 that I am willing to provide until she can come in next week and see her PCP (who knows her situation better). - short term rx for Percocet 5-325mg  q6h prn #30 no refills provided to patient - no UDS performed today because she says she ran out of oxy last week (about 1 week earlier than she should have) - return to clinic next week to meet with her PCP (Dr. Hulen Luster) to discuss the appropriateness of pain contract and/or other pain management options for this patient

## 2015-08-07 NOTE — Progress Notes (Signed)
Internal Medicine Clinic Attending  Case discussed with Dr. Wilson at the time of the visit.  We reviewed the resident's history and exam and pertinent patient test results.  I agree with the assessment, diagnosis, and plan of care documented in the resident's note.  

## 2015-08-08 ENCOUNTER — Encounter: Payer: Medicaid Other | Admitting: Internal Medicine

## 2015-08-10 ENCOUNTER — Other Ambulatory Visit: Payer: Self-pay | Admitting: Gastroenterology

## 2015-08-10 NOTE — Addendum Note (Signed)
Addended by: Arta Silence on: 08/10/2015 05:25 PM   Modules accepted: Orders

## 2015-08-13 NOTE — Progress Notes (Signed)
08-13-15 1000 Pt states procedure is cancelled, but it remains scheduled-please advise [place] if pt not having procedure on 08-15-15 / [phone].

## 2015-08-14 ENCOUNTER — Encounter: Payer: Self-pay | Admitting: Student

## 2015-08-15 ENCOUNTER — Ambulatory Visit (HOSPITAL_COMMUNITY): Admission: RE | Admit: 2015-08-15 | Payer: Medicaid Other | Source: Ambulatory Visit | Admitting: Gastroenterology

## 2015-08-15 ENCOUNTER — Encounter (HOSPITAL_COMMUNITY): Admission: RE | Payer: Self-pay | Source: Ambulatory Visit

## 2015-08-15 SURGERY — COLONOSCOPY WITH PROPOFOL
Anesthesia: Monitor Anesthesia Care

## 2015-08-29 ENCOUNTER — Encounter: Payer: Self-pay | Admitting: Podiatry

## 2015-08-29 ENCOUNTER — Ambulatory Visit: Payer: Medicaid Other | Admitting: Podiatry

## 2015-08-29 ENCOUNTER — Ambulatory Visit (INDEPENDENT_AMBULATORY_CARE_PROVIDER_SITE_OTHER): Payer: Medicaid Other | Admitting: Podiatry

## 2015-08-29 VITALS — BP 152/98 | HR 69 | Resp 12

## 2015-08-29 DIAGNOSIS — M79674 Pain in right toe(s): Secondary | ICD-10-CM

## 2015-08-29 DIAGNOSIS — Q828 Other specified congenital malformations of skin: Secondary | ICD-10-CM | POA: Diagnosis not present

## 2015-08-29 DIAGNOSIS — B351 Tinea unguium: Secondary | ICD-10-CM

## 2015-08-29 DIAGNOSIS — E1151 Type 2 diabetes mellitus with diabetic peripheral angiopathy without gangrene: Secondary | ICD-10-CM | POA: Diagnosis not present

## 2015-08-29 DIAGNOSIS — L84 Corns and callosities: Secondary | ICD-10-CM

## 2015-08-29 NOTE — Patient Instructions (Signed)
Diabetes and Foot Care Diabetes may cause you to have problems because of poor blood supply (circulation) to your feet and legs. This may cause the skin on your feet to become thinner, break easier, and heal more slowly. Your skin may become dry, and the skin may peel and crack. You may also have nerve damage in your legs and feet causing decreased feeling in them. You may not notice minor injuries to your feet that could lead to infections or more serious problems. Taking care of your feet is one of the most important things you can do for yourself.  HOME CARE INSTRUCTIONS  Wear shoes at all times, even in the house. Do not go barefoot. Bare feet are easily injured.  Check your feet daily for blisters, cuts, and redness. If you cannot see the bottom of your feet, use a mirror or ask someone for help.  Wash your feet with warm water (do not use hot water) and mild soap. Then pat your feet and the areas between your toes until they are completely dry. Do not soak your feet as this can dry your skin.  Apply a moisturizing lotion or petroleum jelly (that does not contain alcohol and is unscented) to the skin on your feet and to dry, brittle toenails. Do not apply lotion between your toes.  Trim your toenails straight across. Do not dig under them or around the cuticle. File the edges of your nails with an emery board or nail file.  Do not cut corns or calluses or try to remove them with medicine.  Wear clean socks or stockings every day. Make sure they are not too tight. Do not wear knee-high stockings since they may decrease blood flow to your legs.  Wear shoes that fit properly and have enough cushioning. To break in new shoes, wear them for just a few hours a day. This prevents you from injuring your feet. Always look in your shoes before you put them on to be sure there are no objects inside.  Do not cross your legs. This may decrease the blood flow to your feet.  If you find a minor scrape,  cut, or break in the skin on your feet, keep it and the skin around it clean and dry. These areas may be cleansed with mild soap and water. Do not cleanse the area with peroxide, alcohol, or iodine.  When you remove an adhesive bandage, be sure not to damage the skin around it.  If you have a wound, look at it several times a day to make sure it is healing.  Do not use heating pads or hot water bottles. They may burn your skin. If you have lost feeling in your feet or legs, you may not know it is happening until it is too late.  Make sure your health care provider performs a complete foot exam at least annually or more often if you have foot problems. Report any cuts, sores, or bruises to your health care provider immediately. SEEK MEDICAL CARE IF:   You have an injury that is not healing.  You have cuts or breaks in the skin.  You have an ingrown nail.  You notice redness on your legs or feet.  You feel burning or tingling in your legs or feet.  You have pain or cramps in your legs and feet.  Your legs or feet are numb.  Your feet always feel cold. SEEK IMMEDIATE MEDICAL CARE IF:   There is increasing redness,   swelling, or pain in or around a wound.  There is a red line that goes up your leg.  Pus is coming from a wound.  You develop a fever or as directed by your health care provider.  You notice a bad smell coming from an ulcer or wound.   This information is not intended to replace advice given to you by your health care provider. Make sure you discuss any questions you have with your health care provider.   Document Released: 09/12/2000 Document Revised: 05/18/2013 Document Reviewed: 02/22/2013 Elsevier Interactive Patient Education 2016 [employer]  

## 2015-08-30 NOTE — Progress Notes (Signed)
Patient ID: [name] [name], female   DOB: 1950/10/25, 64 y.o.   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

## 2015-09-06 ENCOUNTER — Encounter: Payer: Self-pay | Admitting: Family

## 2015-09-11 ENCOUNTER — Ambulatory Visit: Payer: Medicaid Other | Admitting: Podiatry

## 2015-09-12 ENCOUNTER — Encounter (HOSPITAL_COMMUNITY): Payer: Medicaid Other

## 2015-09-12 ENCOUNTER — Ambulatory Visit: Payer: Medicaid Other | Admitting: Family

## 2015-09-12 ENCOUNTER — Other Ambulatory Visit (HOSPITAL_COMMUNITY): Payer: Medicaid Other

## 2015-09-13 ENCOUNTER — Encounter (HOSPITAL_COMMUNITY): Payer: Medicaid Other

## 2015-09-13 ENCOUNTER — Ambulatory Visit: Payer: Medicaid Other | Admitting: Family

## 2015-09-17 ENCOUNTER — Encounter: Payer: Self-pay | Admitting: Internal Medicine

## 2015-09-17 ENCOUNTER — Ambulatory Visit (INDEPENDENT_AMBULATORY_CARE_PROVIDER_SITE_OTHER): Payer: Medicaid Other | Admitting: Internal Medicine

## 2015-09-17 ENCOUNTER — Other Ambulatory Visit: Payer: Self-pay | Admitting: Internal Medicine

## 2015-09-17 VITALS — BP 180/87 | HR 63 | Temp 98.5°F | Wt 120.2 lb

## 2015-09-17 DIAGNOSIS — I129 Hypertensive chronic kidney disease with stage 1 through stage 4 chronic kidney disease, or unspecified chronic kidney disease: Secondary | ICD-10-CM

## 2015-09-17 DIAGNOSIS — I10 Essential (primary) hypertension: Secondary | ICD-10-CM

## 2015-09-17 DIAGNOSIS — E559 Vitamin D deficiency, unspecified: Secondary | ICD-10-CM | POA: Diagnosis present

## 2015-09-17 DIAGNOSIS — Z89512 Acquired absence of left leg below knee: Secondary | ICD-10-CM | POA: Diagnosis not present

## 2015-09-17 DIAGNOSIS — N183 Chronic kidney disease, stage 3 (moderate): Secondary | ICD-10-CM | POA: Diagnosis not present

## 2015-09-17 DIAGNOSIS — Z0000 Encounter for general adult medical examination without abnormal findings: Secondary | ICD-10-CM

## 2015-09-17 DIAGNOSIS — Y828 Other medical devices associated with adverse incidents: Secondary | ICD-10-CM

## 2015-09-17 DIAGNOSIS — E1122 Type 2 diabetes mellitus with diabetic chronic kidney disease: Secondary | ICD-10-CM

## 2015-09-17 DIAGNOSIS — M79609 Pain in unspecified limb: Secondary | ICD-10-CM

## 2015-09-17 DIAGNOSIS — T8789 Other complications of amputation stump: Secondary | ICD-10-CM

## 2015-09-17 MED ORDER — CARVEDILOL 12.5 MG PO TABS: 12.5000 mg | ORAL_TABLET | Freq: Two times a day (BID) | ORAL | Status: DC

## 2015-09-17 MED ORDER — GABAPENTIN 400 MG PO CAPS
400.0000 mg | ORAL_CAPSULE | Freq: Three times a day (TID) | ORAL | Status: DC
Start: 2015-09-17 — End: 2015-11-02

## 2015-09-17 MED ORDER — VITAMIN D 1000 UNITS PO TABS: 1000.0000 [IU] | ORAL_TABLET | Freq: Every day | ORAL | Status: DC

## 2015-09-17 MED ORDER — OXYCODONE-ACETAMINOPHEN 5-325 MG PO TABS: 1.0000 | ORAL_TABLET | Freq: Four times a day (QID) | ORAL | Status: DC | PRN

## 2015-09-17 MED ORDER — ISOSORB DINITRATE-HYDRALAZINE 20-37.5 MG PO TABS: 1.0000 | ORAL_TABLET | Freq: Three times a day (TID) | ORAL | Status: DC

## 2015-09-17 MED ORDER — LISINOPRIL 10 MG PO TABS: 10.0000 mg | ORAL_TABLET | Freq: Every day | ORAL | Status: DC

## 2015-09-17 MED ORDER — FUROSEMIDE 40 MG PO TABS: 40.0000 mg | ORAL_TABLET | Freq: Every day | ORAL | Status: DC

## 2015-09-17 MED ORDER — ATORVASTATIN CALCIUM 40 MG PO TABS: 40.0000 mg | ORAL_TABLET | Freq: Every day | ORAL | Status: DC

## 2015-09-17 NOTE — Patient Instructions (Signed)
Start taking Vitamin D 1000 units once a day.   I will contact Golden Hurter the clinic social worker to contact you regarding financial assistance for medications and increased health aid work hours.   I have referred you for a colonoscopy.

## 2015-09-18 ENCOUNTER — Telehealth: Payer: Self-pay | Admitting: Licensed Clinical Social Worker

## 2015-09-18 LAB — BMP8+ANION GAP
ANION GAP: 15 mmol/L (ref 10.0–18.0)
BUN/Creatinine Ratio: 13 (ref 11–26)
BUN: 23 mg/dL (ref 8–27)
CALCIUM: 9.6 mg/dL (ref 8.7–10.3)
CO2: 21 mmol/L (ref 18–29)
Chloride: 108 mmol/L — ABNORMAL HIGH (ref 96–106)
Creatinine, Ser: 1.74 mg/dL — ABNORMAL HIGH (ref 0.57–1.00)
GFR calc Af Amer: 36 mL/min/{1.73_m2} — ABNORMAL LOW (ref >59)
GFR, EST NON AFRICAN AMERICAN: 31 mL/min/{1.73_m2} — AB (ref >59)
Glucose: 126 mg/dL — ABNORMAL HIGH (ref 65–99)
POTASSIUM: 4.4 mmol/L (ref 3.5–5.2)
Sodium: 144 mmol/L (ref 134–144)
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
## 2015-09-18 NOTE — Telephone Encounter (Signed)
Ms. Minkoff left voicemail requesting [facility] to fax letter to [facility] indicating pt is in need of assistance with laundry and grocery shopping.  Ms. Dehaan also indicated to PCP that she was in need of additional hours.  Pt recently had yearly PCS assessment.  CSW placed call to [facility] to determine pt's current services.  ARO was familiar with pt's concerns.  ARO states the PCS aid is not able to transport pt in aid's personal car; however, aid would be able to ride along with Ms. Gotsch on the [place] bus to [place] and to [employer].  After obtaining this additional information, CSW placed call to [name].  CSW informed [name],  [facility] PCS aids cannot transport patients in their own personal car for liability issues and patient's must pay the cost for laundering their own clothes.   CSW inquired if pt was having financial difficulty affording her $3 copayment.  Pt states she is currently in a 2 bedroom apartment and has bills to pay.  Pt states she had a roommate but is currently there by herself. Pt states she does not want to move again, as she can not afford the cost for a van and movers.  CSW encouraged Ms. Bello to discuss payment with pharmacy as pt can discuss options to pay pharmacy when she receives funds.  Pt hesitant to accept CSW suggestions at this time and denied add'l social work needs.

## 2015-09-18 NOTE — Assessment & Plan Note (Signed)
Follows with [facility]. She has not seen them since last year. Creatinine and GFR have since minimally improved. Advised to make an appt with them to f/u.

## 2015-09-18 NOTE — Progress Notes (Signed)
Internal Medicine Clinic Attending  Case discussed with Dr. Truong soon after the resident saw the patient.  We reviewed the resident's history and exam and pertinent patient test results.  I agree with the assessment, diagnosis, and plan of care documented in the resident's note. 

## 2015-09-18 NOTE — Assessment & Plan Note (Addendum)
Pt has lt BKA stump pain due to an ill fitting prosthetic. She has been taking percocet which has helped with pain.   - refilled percocet. Can sign a pain contract at next visit for #30 tabs a month. - refilled gabapentin

## 2015-09-18 NOTE — Assessment & Plan Note (Signed)
BP Readings from Last 3 Encounters:  09/17/15 180/87  08/29/15 152/98  08/01/15 184/75    Lab Results  Component Value Date   NA 144 09/17/2015   K 4.4 09/17/2015   CREATININE 1.74* 09/17/2015    Assessment: Blood pressure control:  elevated Progress toward BP goal:   deteriorated  Comments: pt has been out of lasix and bidil for an unknown amount of time per patient. She was recently started on lisinopril 10mg  at her last clinic visit.   Plan: Medications:  continue current medications, refilled lasix and bidil Educational resources provided:   Self management tools provided:   Other plans: ordered BMET today: creatinine and K stable. F/u in 3 months.

## 2015-09-18 NOTE — Assessment & Plan Note (Signed)
Placed referral for colonoscopy. Informed nurse of pt's pre existing mammogram referral as pt reports she has not received a phone call for an appointment.

## 2015-09-18 NOTE — Assessment & Plan Note (Signed)
Pt is unable to afford Vit D 50,000 units. Last Vit D was 16.5 in October.   - rx for generic Vit D 1000 units daily.  - also sent clinic SW a message regarding financial assistance for meds

## 2015-09-19 ENCOUNTER — Encounter: Payer: Self-pay | Admitting: Licensed Clinical Social Worker

## 2015-09-19 ENCOUNTER — Ambulatory Visit: Payer: Medicaid Other | Admitting: Podiatry

## 2015-09-19 ENCOUNTER — Telehealth: Payer: Self-pay | Admitting: Internal Medicine

## 2015-09-19 NOTE — Telephone Encounter (Signed)
Thank you.  Please see telephone note 09/18/15, discussed with [name] in depth.  Will contact her once again.

## 2015-09-19 NOTE — Telephone Encounter (Signed)
CSW placed call to [name].  Pt informed her PCS agency, [place], does not allow aid to transport patients in their personal cars.  If pt would like aid to assist with laundry, CSW suggests pt and aid utilize [employer] to go to [employer] and grocery shopping.  [name] [name] would have to pay the cost to utilize [employer], aid would ride free as a companion.  [name] [name] is aware of her option to change PCS agency.  Pt states she completed a [employer] application several years ago.  CSW will mail [employer] application to [name] with return envelope.  Pt voiced understanding.

## 2015-09-19 NOTE — Telephone Encounter (Signed)
CSW received voice mail from [name] requesting additional PCS hours for laundry and grocery shopping.  CSW will place the information provide during the last 2 conversations with [name] in letter form and mail along with [employer] application.

## 2015-09-19 NOTE — Telephone Encounter (Signed)
Patient seen in clinic yesterday and is requesting [place] to help her with her Laundry and [employer].  Unsure of the Agency's Name but the number is  [phone].

## 2015-09-20 ENCOUNTER — Encounter: Payer: Self-pay | Admitting: Internal Medicine

## 2015-09-25 ENCOUNTER — Ambulatory Visit: Payer: Medicaid Other | Admitting: Family

## 2015-09-25 ENCOUNTER — Encounter (HOSPITAL_COMMUNITY): Payer: Medicaid Other

## 2015-10-10 ENCOUNTER — Encounter: Payer: Self-pay | Admitting: *Deleted

## 2015-10-10 ENCOUNTER — Ambulatory Visit (INDEPENDENT_AMBULATORY_CARE_PROVIDER_SITE_OTHER): Payer: Medicaid Other | Admitting: Podiatry

## 2015-10-10 ENCOUNTER — Encounter: Payer: Self-pay | Admitting: Podiatry

## 2015-10-10 VITALS — BP 158/69 | HR 63 | Resp 12

## 2015-10-10 DIAGNOSIS — E1151 Type 2 diabetes mellitus with diabetic peripheral angiopathy without gangrene: Secondary | ICD-10-CM

## 2015-10-10 DIAGNOSIS — L84 Corns and callosities: Secondary | ICD-10-CM | POA: Diagnosis not present

## 2015-10-10 NOTE — Progress Notes (Signed)
Patient ID: [name] [name], female   DOB: Mar 24, 1951, 65 y.o.   MRN: [id number]  Subjective: This patient presents for scheduled visit for debridement of her pre-ulcerative plantar callus sub-fifth right MPJ at approximately 6 week intervals. Patient has history of BK amputation left  Objective: BK amputation left Keratoses distal right hallux Well-organized bleeding callus sub-fifth right MPJ that remains closed after debridement. There is no surrounding erythema, edema, drainage  Assessment: Pre-ulcerative plantar callus sub-fifth right MPJ Diabetic with a history of peripheral arterial disease and history of BK amputation  Plan: Debrided pre-ulcerative plantar callus sub-fifth MPJ right without any bleeding Patient will continue wearing diabetic shoe with custom insole with additional felt padding to offload the fifth right MPJ  Reappoint 6 weeks

## 2015-10-10 NOTE — Patient Instructions (Signed)
Diabetes and Foot Care Diabetes may cause you to have problems because of poor blood supply (circulation) to your feet and legs. This may cause the skin on your feet to become thinner, break easier, and heal more slowly. Your skin may become dry, and the skin may peel and crack. You may also have nerve damage in your legs and feet causing decreased feeling in them. You may not notice minor injuries to your feet that could lead to infections or more serious problems. Taking care of your feet is one of the most important things you can do for yourself.  HOME CARE INSTRUCTIONS  Wear shoes at all times, even in the house. Do not go barefoot. Bare feet are easily injured.  Check your feet daily for blisters, cuts, and redness. If you cannot see the bottom of your feet, use a mirror or ask someone for help.  Wash your feet with warm water (do not use hot water) and mild soap. Then pat your feet and the areas between your toes until they are completely dry. Do not soak your feet as this can dry your skin.  Apply a moisturizing lotion or petroleum jelly (that does not contain alcohol and is unscented) to the skin on your feet and to dry, brittle toenails. Do not apply lotion between your toes.  Trim your toenails straight across. Do not dig under them or around the cuticle. File the edges of your nails with an emery board or nail file.  Do not cut corns or calluses or try to remove them with medicine.  Wear clean socks or stockings every day. Make sure they are not too tight. Do not wear knee-high stockings since they may decrease blood flow to your legs.  Wear shoes that fit properly and have enough cushioning. To break in new shoes, wear them for just a few hours a day. This prevents you from injuring your feet. Always look in your shoes before you put them on to be sure there are no objects inside.  Do not cross your legs. This may decrease the blood flow to your feet.  If you find a minor scrape,  cut, or break in the skin on your feet, keep it and the skin around it clean and dry. These areas may be cleansed with mild soap and water. Do not cleanse the area with peroxide, alcohol, or iodine.  When you remove an adhesive bandage, be sure not to damage the skin around it.  If you have a wound, look at it several times a day to make sure it is healing.  Do not use heating pads or hot water bottles. They may burn your skin. If you have lost feeling in your feet or legs, you may not know it is happening until it is too late.  Make sure your health care provider performs a complete foot exam at least annually or more often if you have foot problems. Report any cuts, sores, or bruises to your health care provider immediately. SEEK MEDICAL CARE IF:   You have an injury that is not healing.  You have cuts or breaks in the skin.  You have an ingrown nail.  You notice redness on your legs or feet.  You feel burning or tingling in your legs or feet.  You have pain or cramps in your legs and feet.  Your legs or feet are numb.  Your feet always feel cold. SEEK IMMEDIATE MEDICAL CARE IF:   There is increasing redness,   swelling, or pain in or around a wound.  There is a red line that goes up your leg.  Pus is coming from a wound.  You develop a fever or as directed by your health care provider.  You notice a bad smell coming from an ulcer or wound.   This information is not intended to replace advice given to you by your health care provider. Make sure you discuss any questions you have with your health care provider.   Document Released: 09/12/2000 Document Revised: 05/18/2013 Document Reviewed: 02/22/2013 Elsevier Interactive Patient Education 2016 [employer]  

## 2015-10-30 NOTE — Addendum Note (Signed)
Addended by: Alinda Dooms A on: 10/30/2015 02:14 PM   Modules accepted: Orders

## 2015-11-01 ENCOUNTER — Ambulatory Visit (INDEPENDENT_AMBULATORY_CARE_PROVIDER_SITE_OTHER): Payer: Medicaid Other | Admitting: Internal Medicine

## 2015-11-01 ENCOUNTER — Encounter: Payer: Self-pay | Admitting: Internal Medicine

## 2015-11-01 VITALS — BP 186/63 | HR 59 | Temp 98.9°F | Resp 18 | Ht 62.0 in | Wt 123.5 lb

## 2015-11-01 DIAGNOSIS — T8789 Other complications of amputation stump: Secondary | ICD-10-CM | POA: Diagnosis present

## 2015-11-01 DIAGNOSIS — Z89512 Acquired absence of left leg below knee: Secondary | ICD-10-CM | POA: Diagnosis not present

## 2015-11-01 DIAGNOSIS — G546 Phantom limb syndrome with pain: Secondary | ICD-10-CM

## 2015-11-01 DIAGNOSIS — I10 Essential (primary) hypertension: Secondary | ICD-10-CM | POA: Diagnosis not present

## 2015-11-01 DIAGNOSIS — M79609 Pain in unspecified limb: Secondary | ICD-10-CM

## 2015-11-01 MED ORDER — OXYCODONE-ACETAMINOPHEN 5-325 MG PO TABS: 1.0000 | ORAL_TABLET | Freq: Four times a day (QID) | ORAL | Status: DC | PRN
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
## 2015-11-01 NOTE — Assessment & Plan Note (Signed)
Pt brought in all her empty medication bottles that she needed refills on including oxycodone 5mg  and tramadol. She states she is out of those meds and needs refills. I informed her that she was only given rx for oxycodone 5mg  once and the past 2 narcotic prescriptions were for percocet. She was unable to give me a definitive answer on when she last took percocet, she told Leigh RN that she last took all her meds 3 days ago and then she told me that she last took it the last time it was prescribe. After further questions she most likely took it last month? Although it was extremely difficult to get a clear answer from her. She also states she wants oxycodone b/c it is the only thing that works for her. She was informed percocet has oxycodone in it.   - refilled percocet #30 tabs, went over pain contract hilight - no illicit drug use including THC, not requesting pain meds earlier, UDS when requested. Also went over side effects of narcotics including confusion and overdose. Pt states she has previously signed a pain contract before and understands the pain contract. Of note she had an inappropriate UDS in 2014 and exhibited red flag behavior today by bringing her oxycodone bottle that was last prescribed in October and a tramadol bottle that was prescribed in September and requesting them to be refilled. She was also unable to give me a straightforward answer regarding the last time she took percocet. I firmly informed her that narcotics are serious and that she has to be responsible with them including knowing when she last took them. She is aware. Pt does have hyperpigmentation changes of her skin where her prosthesis fits and tenderness to palpation of medial side of her stump. She struggles with phantom limb pain and the sensation that her leg is still there causing her to fall on occasion when she tries to get up thinking her leg is there. For the majority of her time she is in her wheel chair but will put on  her prosthesis to go to church or go shopping. It is reasonable to give her low dose percocet w/ only #30 tabs to help her maintain her social activities due to her ill fitting prosthesis. Will f/u on UDS w/ patient in one month.

## 2015-11-01 NOTE — Progress Notes (Signed)
Internal Medicine Clinic Attending  Case discussed with Dr. Truong soon after the resident saw the patient.  We reviewed the resident's history and exam and pertinent patient test results.  I agree with the assessment, diagnosis, and plan of care documented in the resident's note. 

## 2015-11-01 NOTE — Assessment & Plan Note (Signed)
BP Readings from Last 3 Encounters:  11/01/15 186/63  10/10/15 158/69  09/17/15 180/87    Lab Results  Component Value Date   NA 144 09/17/2015   K 4.4 09/17/2015   CREATININE 1.74* 09/17/2015    Assessment: Blood pressure control:  uncontrolled Progress toward BP goal:   not at goal Comments: pt has been out of meds for 3 days, per Dr. Maudie Mercury, pharmacist pt has been inconsistently refilling her medications. Barriers include transportation and cost. On her empty bottles she has several refills left.   Plan: Medications:  continue current medications Educational resources provided:   Self management tools provided:   Other plans: f/u in 1 month for BP check. Pharmacy is working with patient to have her pharmacy Geneticist, [facility]) mail her prescriptions to her house. Unfortunately she will still have to pay $3.00 for each med.

## 2015-11-02 ENCOUNTER — Encounter: Payer: Self-pay | Admitting: Surgery

## 2015-11-02 ENCOUNTER — Telehealth: Payer: Self-pay | Admitting: Pharmacist

## 2015-11-02 ENCOUNTER — Encounter: Payer: Self-pay | Admitting: Pharmacist

## 2015-11-02 DIAGNOSIS — I25118 Atherosclerotic heart disease of native coronary artery with other forms of angina pectoris: Secondary | ICD-10-CM

## 2015-11-02 DIAGNOSIS — E1142 Type 2 diabetes mellitus with diabetic polyneuropathy: Secondary | ICD-10-CM

## 2015-11-02 DIAGNOSIS — I429 Cardiomyopathy, unspecified: Secondary | ICD-10-CM

## 2015-11-02 DIAGNOSIS — E559 Vitamin D deficiency, unspecified: Secondary | ICD-10-CM

## 2015-11-02 DIAGNOSIS — I739 Peripheral vascular disease, unspecified: Secondary | ICD-10-CM

## 2015-11-02 DIAGNOSIS — I10 Essential (primary) hypertension: Secondary | ICD-10-CM

## 2015-11-02 MED ORDER — VITAMIN D 1000 UNITS PO TABS: 1000.0000 [IU] | ORAL_TABLET | Freq: Every day | ORAL | Status: DC

## 2015-11-02 MED ORDER — FUROSEMIDE 40 MG PO TABS: 40.0000 mg | ORAL_TABLET | Freq: Every day | ORAL | Status: DC

## 2015-11-02 MED ORDER — CARVEDILOL 12.5 MG PO TABS: 12.5000 mg | ORAL_TABLET | Freq: Two times a day (BID) | ORAL | Status: DC

## 2015-11-02 MED ORDER — LISINOPRIL 10 MG PO TABS: 10.0000 mg | ORAL_TABLET | Freq: Every day | ORAL | Status: DC

## 2015-11-02 MED ORDER — ATORVASTATIN CALCIUM 40 MG PO TABS: 40.0000 mg | ORAL_TABLET | Freq: Every day | ORAL | Status: DC

## 2015-11-02 MED ORDER — GABAPENTIN 400 MG PO CAPS: 400.0000 mg | ORAL_CAPSULE | Freq: Three times a day (TID) | ORAL | Status: DC

## 2015-11-02 MED ORDER — ISOSORB DINITRATE-HYDRALAZINE 20-37.5 MG PO TABS: 1.0000 | ORAL_TABLET | Freq: Three times a day (TID) | ORAL | Status: DC

## 2015-11-02 MED ORDER — ASPIRIN 81 MG PO CHEW: 81.0000 mg | CHEWABLE_TABLET | Freq: Every day | ORAL | Status: DC

## 2015-11-02 NOTE — Telephone Encounter (Signed)
Contacted patient for medication review. Patient states cost and convenience are barriers to adherence (reports not currently taking medications). Transferred prescriptions to [facility] and voided Rx at [facility]. [facility] will provide patient with $0 emergency supply (Medicaid) and free home delivery. Patient states she is able to afford ~$20 per month which will cover her maintenance medications, but was advised to contact me if further cost concerns arise. Also notified [facility] pharmacy patient will need further adherence support as appropriate (e.g. Special packaging).

## 2015-11-08 LAB — TOXASSURE SELECT,+ANTIDEPR,UR: PDF: 0

## 2015-11-09 ENCOUNTER — Ambulatory Visit (INDEPENDENT_AMBULATORY_CARE_PROVIDER_SITE_OTHER)
Admission: RE | Admit: 2015-11-09 | Discharge: 2015-11-09 | Disposition: A | Discharge status: Home / Self Care | Payer: Medicaid Other | Source: Ambulatory Visit | Attending: Family | Admitting: Family

## 2015-11-09 ENCOUNTER — Encounter: Payer: Self-pay | Admitting: Family

## 2015-11-09 ENCOUNTER — Ambulatory Visit (HOSPITAL_COMMUNITY)
Admission: RE | Admit: 2015-11-09 | Discharge: 2015-11-09 | Disposition: A | Discharge status: Home / Self Care | Payer: Medicaid Other | Source: Ambulatory Visit | Attending: Family | Admitting: Family

## 2015-11-09 ENCOUNTER — Ambulatory Visit (INDEPENDENT_AMBULATORY_CARE_PROVIDER_SITE_OTHER): Payer: Medicaid Other | Admitting: Family

## 2015-11-09 VITALS — BP 129/70 | HR 63 | Temp 98.6°F | Resp 16 | Ht 62.0 in | Wt 115.0 lb

## 2015-11-09 DIAGNOSIS — Z95828 Presence of other vascular implants and grafts: Secondary | ICD-10-CM | POA: Insufficient documentation

## 2015-11-09 DIAGNOSIS — I779 Disorder of arteries and arterioles, unspecified: Secondary | ICD-10-CM | POA: Diagnosis not present

## 2015-11-09 DIAGNOSIS — I6523 Occlusion and stenosis of bilateral carotid arteries: Secondary | ICD-10-CM | POA: Diagnosis not present

## 2015-11-09 DIAGNOSIS — I739 Peripheral vascular disease, unspecified: Secondary | ICD-10-CM

## 2015-11-09 DIAGNOSIS — F17200 Nicotine dependence, unspecified, uncomplicated: Secondary | ICD-10-CM

## 2015-11-09 DIAGNOSIS — Z48812 Encounter for surgical aftercare following surgery on the circulatory system: Secondary | ICD-10-CM

## 2015-11-09 DIAGNOSIS — R0989 Other specified symptoms and signs involving the circulatory and respiratory systems: Secondary | ICD-10-CM

## 2015-11-09 DIAGNOSIS — Z720 Tobacco use: Secondary | ICD-10-CM

## 2015-11-09 DIAGNOSIS — Z89512 Acquired absence of left leg below knee: Secondary | ICD-10-CM | POA: Diagnosis not present
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
## 2015-11-09 NOTE — Patient Instructions (Signed)

## 2015-11-12 NOTE — Addendum Note (Signed)
Addended by: Dorthula Rue L on: 11/12/2015 11:23 AM   Modules accepted: Orders

## 2015-11-20 ENCOUNTER — Telehealth: Payer: Self-pay | Admitting: Licensed Clinical Social Worker

## 2015-11-20 NOTE — Telephone Encounter (Signed)
Thank you :)

## 2015-11-20 NOTE — Telephone Encounter (Signed)
CSW received incoming call from [name].  Pt states approximately 3 weeks ago, her PCS hours were reduced from 80 hr/month to 56 hrs/month.  [name] was assessed by [facility] and the result was the reduction in hours.  Pt denies having new medical issues that impact her ADL's, stating her amputation still limits her abilities.  [name] states she has not improved and does not understand why [name] [name] decreased hours.  CSW offered to provide [name] with the information to appeal the decision; however, pt requesting PCP to complete a change of medical status request.  Pt informed this worker will send request to Dr. Hulen [name] and will notify pt of physician's decision.  Pt aware if PCP completes request, it is not a guarantee of increased hours.

## 2015-11-20 NOTE — Telephone Encounter (Signed)
No, it is not immediate.  Pt is current with services and declines to complete the available self-appeal process with [employer].

## 2015-11-20 NOTE — Telephone Encounter (Signed)
Dr. Hulen Luster is not in office until next week.  Does this need to be done urgently?  Thanks

## 2015-11-21 ENCOUNTER — Encounter: Payer: Self-pay | Admitting: Podiatry

## 2015-11-21 ENCOUNTER — Ambulatory Visit (INDEPENDENT_AMBULATORY_CARE_PROVIDER_SITE_OTHER): Payer: Medicaid Other | Admitting: Podiatry

## 2015-11-21 VITALS — BP 165/86 | HR 69 | Resp 12

## 2015-11-21 DIAGNOSIS — E1151 Type 2 diabetes mellitus with diabetic peripheral angiopathy without gangrene: Secondary | ICD-10-CM | POA: Diagnosis not present

## 2015-11-21 DIAGNOSIS — M79674 Pain in right toe(s): Secondary | ICD-10-CM

## 2015-11-21 DIAGNOSIS — M79676 Pain in unspecified toe(s): Secondary | ICD-10-CM

## 2015-11-21 DIAGNOSIS — B351 Tinea unguium: Secondary | ICD-10-CM

## 2015-11-21 DIAGNOSIS — L84 Corns and callosities: Secondary | ICD-10-CM

## 2015-11-21 NOTE — Patient Instructions (Signed)
Diabetes and Foot Care Diabetes may cause you to have problems because of poor blood supply (circulation) to your feet and legs. This may cause the skin on your feet to become thinner, break easier, and heal more slowly. Your skin may become dry, and the skin may peel and crack. You may also have nerve damage in your legs and feet causing decreased feeling in them. You may not notice minor injuries to your feet that could lead to infections or more serious problems. Taking care of your feet is one of the most important things you can do for yourself.  HOME CARE INSTRUCTIONS  Wear shoes at all times, even in the house. Do not go barefoot. Bare feet are easily injured.  Check your feet daily for blisters, cuts, and redness. If you cannot see the bottom of your feet, use a mirror or ask someone for help.  Wash your feet with warm water (do not use hot water) and mild soap. Then pat your feet and the areas between your toes until they are completely dry. Do not soak your feet as this can dry your skin.  Apply a moisturizing lotion or petroleum jelly (that does not contain alcohol and is unscented) to the skin on your feet and to dry, brittle toenails. Do not apply lotion between your toes.  Trim your toenails straight across. Do not dig under them or around the cuticle. File the edges of your nails with an emery board or nail file.  Do not cut corns or calluses or try to remove them with medicine.  Wear clean socks or stockings every day. Make sure they are not too tight. Do not wear knee-high stockings since they may decrease blood flow to your legs.  Wear shoes that fit properly and have enough cushioning. To break in new shoes, wear them for just a few hours a day. This prevents you from injuring your feet. Always look in your shoes before you put them on to be sure there are no objects inside.  Do not cross your legs. This may decrease the blood flow to your feet.  If you find a minor scrape,  cut, or break in the skin on your feet, keep it and the skin around it clean and dry. These areas may be cleansed with mild soap and water. Do not cleanse the area with peroxide, alcohol, or iodine.  When you remove an adhesive bandage, be sure not to damage the skin around it.  If you have a wound, look at it several times a day to make sure it is healing.  Do not use heating pads or hot water bottles. They may burn your skin. If you have lost feeling in your feet or legs, you may not know it is happening until it is too late.  Make sure your health care provider performs a complete foot exam at least annually or more often if you have foot problems. Report any cuts, sores, or bruises to your health care provider immediately. SEEK MEDICAL CARE IF:   You have an injury that is not healing.  You have cuts or breaks in the skin.  You have an ingrown nail.  You notice redness on your legs or feet.  You feel burning or tingling in your legs or feet.  You have pain or cramps in your legs and feet.  Your legs or feet are numb.  Your feet always feel cold. SEEK IMMEDIATE MEDICAL CARE IF:   There is increasing redness,   swelling, or pain in or around a wound.  There is a red line that goes up your leg.  Pus is coming from a wound.  You develop a fever or as directed by your health care provider.  You notice a bad smell coming from an ulcer or wound.   This information is not intended to replace advice given to you by your health care provider. Make sure you discuss any questions you have with your health care provider.   Document Released: 09/12/2000 Document Revised: 05/18/2013 Document Reviewed: 02/22/2013 Elsevier Interactive Patient Education 2016 [employer]  

## 2015-11-21 NOTE — Progress Notes (Signed)
Patient ID: [name] [name], female   DOB: Sep 05, 1951, 65 y.o.   MRN: [id number]  Subjective: This patient presents for scheduled visit for debridement of pre-ulcerative plantar callus fifth right MPJ as well as complaining of thickened elongated toenails 5 on the right foot  Objective: Orientated 3 BK amputation left Distal keratoses right hallux Large buildup of plantar callus sub-fifth right MPJ with central bleeding area that remains closed after debridement. Nose no surrounding erythema, edema, warmth surrounding the fifth right MPJ  Assessment: Symptomatic onychomycoses 1-5 right Diabetic with peripheral arterial disease BK amputation left Pre-ulcerative plantar callus sub-fifth MPJ right  Plan: Debridement of pre-ulcerative plantar callus sub-fifth right MPJ without any bleeding Debridement of toenails 1-5 right mechanically an electrical without any bleeding Patient encouraged use wheelchair is much as possible avoid prosthetic limb Patient will wear diabetic shoes with weightbearing with additional padding to offload the fifth right MPJ  Reappoint 4 weeks

## 2015-11-28 NOTE — Addendum Note (Signed)
Addended by: Hulan Fray on: 11/28/2015 07:47 PM   Modules accepted: Orders

## 2015-11-29 NOTE — Telephone Encounter (Signed)
CSW placed call to [name].  Pt notified physician indicated no change in medical condition at this time.  CSW inquired if [name] would like information to client appeal the assessment and hours received.  Pt states she is going to speak with her aide and notify this worker.

## 2015-12-10 ENCOUNTER — Telehealth: Payer: Self-pay | Admitting: Internal Medicine

## 2015-12-11 ENCOUNTER — Telehealth: Payer: Self-pay | Admitting: *Deleted

## 2015-12-11 NOTE — Telephone Encounter (Addendum)
Pt states she was told to stay off her foot by Dr. Amalia Hailey and she needs to get more hours of home health care.  I spoke with [place] [phone] and she said pt would need to have paperwork from her primary doctor as well, and would send paperwork for Dr. Amalia Hailey to request home health care also.  Completed required forms faxed.

## 2015-12-19 ENCOUNTER — Encounter: Payer: Self-pay | Admitting: Podiatry

## 2015-12-19 ENCOUNTER — Ambulatory Visit (INDEPENDENT_AMBULATORY_CARE_PROVIDER_SITE_OTHER): Payer: Medicaid Other | Admitting: Podiatry

## 2015-12-19 VITALS — BP 153/87 | HR 60 | Resp 12

## 2015-12-19 DIAGNOSIS — L84 Corns and callosities: Secondary | ICD-10-CM

## 2015-12-19 DIAGNOSIS — Q828 Other specified congenital malformations of skin: Secondary | ICD-10-CM

## 2015-12-19 DIAGNOSIS — E1151 Type 2 diabetes mellitus with diabetic peripheral angiopathy without gangrene: Secondary | ICD-10-CM

## 2015-12-19 NOTE — Progress Notes (Signed)
Patient ID: [name] [name], female   DOB: 03/05/1951, 65 y.o.   MRN: [id number]   Subjective: This patient presents for scheduled visit for debridement of pre-ulcerative plantar callus fifth right MPJ as well as complaining of thickened elongated toenails 5 on the right foot Patient transfers from wheelchair to treatment table  Objective: Orientated 3 BK amputation left [place] keratoses right hallux Large buildup of plantar callus sub-fifth right MPJ with central bleeding area that remains closed after debridement. Nose no surrounding erythema, edema, warmth surrounding the fifth right MPJ Her toe 1-5 right  Assessment: Symptomatic onychomycoses 1-5 right Diabetic with peripheral arterial disease BK amputation left Pre-ulcerative plantar callus sub-fifth MPJ right  Plan: Debridement of pre-ulcerative plantar callus sub-fifth right MPJ without any bleeding Debridement of toenails 1-5 right mechanically an electrical without any bleeding Patient encouraged use wheelchair is much as possible avoid prosthetic limb Patient will wear diabetic shoes with weightbearing with additional padding to offload the fifth right MPJ  Reappoint 4 weeks

## 2015-12-19 NOTE — Patient Instructions (Signed)
Diabetes and Foot Care Diabetes may cause you to have problems because of poor blood supply (circulation) to your feet and legs. This may cause the skin on your feet to become thinner, break easier, and heal more slowly. Your skin may become dry, and the skin may peel and crack. You may also have nerve damage in your legs and feet causing decreased feeling in them. You may not notice minor injuries to your feet that could lead to infections or more serious problems. Taking care of your feet is one of the most important things you can do for yourself.  HOME CARE INSTRUCTIONS  Wear shoes at all times, even in the house. Do not go barefoot. Bare feet are easily injured.  Check your feet daily for blisters, cuts, and redness. If you cannot see the bottom of your feet, use a mirror or ask someone for help.  Wash your feet with warm water (do not use hot water) and mild soap. Then pat your feet and the areas between your toes until they are completely dry. Do not soak your feet as this can dry your skin.  Apply a moisturizing lotion or petroleum jelly (that does not contain alcohol and is unscented) to the skin on your feet and to dry, brittle toenails. Do not apply lotion between your toes.  Trim your toenails straight across. Do not dig under them or around the cuticle. File the edges of your nails with an emery board or nail file.  Do not cut corns or calluses or try to remove them with medicine.  Wear clean socks or stockings every day. Make sure they are not too tight. Do not wear knee-high stockings since they may decrease blood flow to your legs.  Wear shoes that fit properly and have enough cushioning. To break in new shoes, wear them for just a few hours a day. This prevents you from injuring your feet. Always look in your shoes before you put them on to be sure there are no objects inside.  Do not cross your legs. This may decrease the blood flow to your feet.  If you find a minor scrape,  cut, or break in the skin on your feet, keep it and the skin around it clean and dry. These areas may be cleansed with mild soap and water. Do not cleanse the area with peroxide, alcohol, or iodine.  When you remove an adhesive bandage, be sure not to damage the skin around it.  If you have a wound, look at it several times a day to make sure it is healing.  Do not use heating pads or hot water bottles. They may burn your skin. If you have lost feeling in your feet or legs, you may not know it is happening until it is too late.  Make sure your health care provider performs a complete foot exam at least annually or more often if you have foot problems. Report any cuts, sores, or bruises to your health care provider immediately. SEEK MEDICAL CARE IF:   You have an injury that is not healing.  You have cuts or breaks in the skin.  You have an ingrown nail.  You notice redness on your legs or feet.  You feel burning or tingling in your legs or feet.  You have pain or cramps in your legs and feet.  Your legs or feet are numb.  Your feet always feel cold. SEEK IMMEDIATE MEDICAL CARE IF:   There is increasing redness,   swelling, or pain in or around a wound.  There is a red line that goes up your leg.  Pus is coming from a wound.  You develop a fever or as directed by your health care provider.  You notice a bad smell coming from an ulcer or wound.   This information is not intended to replace advice given to you by your health care provider. Make sure you discuss any questions you have with your health care provider.   Document Released: 09/12/2000 Document Revised: 05/18/2013 Document Reviewed: 02/22/2013 Elsevier Interactive Patient Education 2016 [employer]  

## 2016-01-01 ENCOUNTER — Encounter: Payer: Self-pay | Admitting: Internal Medicine

## 2016-01-01 ENCOUNTER — Ambulatory Visit (INDEPENDENT_AMBULATORY_CARE_PROVIDER_SITE_OTHER): Payer: Medicaid Other | Admitting: Internal Medicine

## 2016-01-01 VITALS — BP 173/76 | HR 60 | Temp 98.1°F | Ht 62.0 in | Wt 123.8 lb

## 2016-01-01 DIAGNOSIS — I129 Hypertensive chronic kidney disease with stage 1 through stage 4 chronic kidney disease, or unspecified chronic kidney disease: Secondary | ICD-10-CM | POA: Diagnosis not present

## 2016-01-01 DIAGNOSIS — T8789 Other complications of amputation stump: Secondary | ICD-10-CM

## 2016-01-01 DIAGNOSIS — N183 Chronic kidney disease, stage 3 unspecified: Secondary | ICD-10-CM

## 2016-01-01 DIAGNOSIS — G8929 Other chronic pain: Secondary | ICD-10-CM | POA: Diagnosis not present

## 2016-01-01 DIAGNOSIS — Z89512 Acquired absence of left leg below knee: Secondary | ICD-10-CM | POA: Diagnosis not present

## 2016-01-01 DIAGNOSIS — Z79899 Other long term (current) drug therapy: Secondary | ICD-10-CM | POA: Diagnosis not present

## 2016-01-01 DIAGNOSIS — M79605 Pain in left leg: Secondary | ICD-10-CM

## 2016-01-01 DIAGNOSIS — E1122 Type 2 diabetes mellitus with diabetic chronic kidney disease: Secondary | ICD-10-CM | POA: Diagnosis present

## 2016-01-01 DIAGNOSIS — M79609 Pain in unspecified limb: Secondary | ICD-10-CM

## 2016-01-01 DIAGNOSIS — N189 Chronic kidney disease, unspecified: Secondary | ICD-10-CM

## 2016-01-01 DIAGNOSIS — I10 Essential (primary) hypertension: Secondary | ICD-10-CM

## 2016-01-01 LAB — GLUCOSE, CAPILLARY: GLUCOSE-CAPILLARY: 83 mg/dL (ref 65–99)

## 2016-01-01 LAB — POCT GLYCOSYLATED HEMOGLOBIN (HGB A1C): HEMOGLOBIN A1C: 5.2

## 2016-01-01 MED ORDER — OXYCODONE-ACETAMINOPHEN 5-325 MG PO TABS: 1.0000 | ORAL_TABLET | Freq: Four times a day (QID) | ORAL | Status: DC | PRN

## 2016-01-01 NOTE — Patient Instructions (Addendum)
I refilled your percocet prescription. This medication is to be used only as needed.   Please make sure to take your blood pressure medications everyday.   We will see you in clinic in one month on May 4th. Please do not miss this appointment.

## 2016-01-01 NOTE — Assessment & Plan Note (Addendum)
Pt presenting for pain medication refill. Her last UDS was positive for THC. She currently denies using marijuana. As noted from prior visit, a pain contract was signed and she was specifically informed marijuana is against the pain contract and will not be tolerated, this was also underlined and emphasized. This pain contract is available on EPIC. Pt was last given a refill of percocet on 11/01/15 and she is therefore due for a prescription. She cannot recall the last time she took percocet which is the same case as last visit. She was told at this visit that she needs to know when the last time she took her pain medications. (This is so that I can accurately document this and note this on future drug screens). She was asked multiple times and various ways when she last took her medication and she kept repeating that she took it the last time I prescribed it to her, she then tried to calculate the time period when she last took the medication by verbalizing that she gets 30 pills to take every 6 hours but still was only able to answer that she took it the last time I prescribed it to her. Of note as documented 07/04/2015 she was found to have red flag behavior when she was prescribed pain meds by Dr. Naaman Plummer in 2014 who actually discharged this patient from his clinic due to inconsistent UDS and confusion on pain medications. Her UDS was + for THC, alcohol, and tramadol. It was NEGATIVE for oxycodone. At this visit in October 2016 she was informed that [facility] will not be tolerated on UDS if she is offered a pain contract here.  -UDS ordered. Urine sample was noted to be uncharacteristically cold and clear by laboratory staff and myself, if creatinine in urine sample is abnormal this means the sample was likely mixed with water or tampered with somehow. Will at that time need to discuss with an attending what the next steps would be.  - if UDS positive for THC her pain contract will be discontinued and she will no  longer be offered pain medications through this provider as this is the 2nd time that I have told pt THC will not be tolerated and she has not be consistent in knowing when she took her medications.  - given one month refill for percocet q6h #30 tabs that was meant to be PRN ONLY when she wears her prosthetic which she states she wears only for social activities such as church. This was outlined in her pain contract.  - as noted in HPI, her left BKA stump is without any bruising, she is tender to palpation of her stump but that is unchanged from previous exams. Will continue to monitor.   UPDATE 01/05/16-- UDS resulted. Red flag: Creatinine level of 10, normal is above 20. Also negative for gabapentin which is also to help w/ pt's chronic pain. Will need to discuss results with patient in person at her upcoming appointment this month.

## 2016-01-01 NOTE — Assessment & Plan Note (Signed)
Lab Results  Component Value Date   HGBA1C 5.2 01/01/2016   HGBA1C 5.1 07/04/2015   HGBA1C 5.1 11/02/2014     Assessment: Diabetes control:  controlled Progress toward A1C goal:   at goal Comments: diet controlled, checking a1c annually now.   Plan: Medications:  diet control Educational resources provided: other (see comments) (DECLINED) Self management tools provided:   Other plans: will not check a1c again until 12/2016.
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
## 2016-01-03 NOTE — Assessment & Plan Note (Signed)
BP Readings from Last 3 Encounters:  01/01/16 173/76  12/19/15 153/87  11/21/15 165/86    Lab Results  Component Value Date   NA 144 09/17/2015   K 4.4 09/17/2015   CREATININE 1.74* 09/17/2015    Assessment: Blood pressure control:  uncontrolled Progress toward BP goal:   deteroriated Comments: on bidil, coreg, lasix, and lisinopril. Issues with non compliance in the past. Due to this it has been arranged for patient to get meds for free delivered to her house. She has a home health RN who sets up a pillbox for her. Pt states she has been taking her medications.   Plan: Medications:  continue current medications Educational resources provided: other (see comments) (DECLINED) Self management tools provided:   Other plans: f/u in 1 month

## 2016-01-04 LAB — TOXASSURE SELECT,+ANTIDEPR,UR: PDF: 0

## 2016-01-04 NOTE — Progress Notes (Signed)
Internal Medicine Clinic Attending  Case discussed with Dr. Truong at the time of the visit.  We reviewed the resident's history and exam and pertinent patient test results.  I agree with the assessment, diagnosis, and plan of care documented in the resident's note.  

## 2016-01-15 ENCOUNTER — Telehealth: Payer: Self-pay | Admitting: Internal Medicine

## 2016-01-15 NOTE — Telephone Encounter (Signed)
HOME HEALTH CUT HOURS FROM 80 TO 66 A MONTH, NEEDS MORE HOURS, DOCTOR NEEDS MAKE CHANGE  OF STATUS NOTE

## 2016-01-16 ENCOUNTER — Ambulatory Visit (INDEPENDENT_AMBULATORY_CARE_PROVIDER_SITE_OTHER): Payer: Medicaid Other | Admitting: Podiatry

## 2016-01-16 ENCOUNTER — Encounter: Payer: Self-pay | Admitting: Podiatry

## 2016-01-16 VITALS — BP 146/78 | HR 66 | Temp 97.1°F | Resp 12

## 2016-01-16 DIAGNOSIS — L84 Corns and callosities: Secondary | ICD-10-CM | POA: Diagnosis not present

## 2016-01-16 DIAGNOSIS — E1151 Type 2 diabetes mellitus with diabetic peripheral angiopathy without gangrene: Secondary | ICD-10-CM

## 2016-01-16 NOTE — Telephone Encounter (Signed)
Spoke w/ pt multiple times about this. There is nothing i can do for this. If they want our office notes then they can request them. As far a letter for change in medical status, i have told pt already that i cannot write one as there has not been a change in status. Thanks.   Julious Oka, MD Internal Medicine Resident, PGY [facility] Pager: [phone]

## 2016-01-16 NOTE — Telephone Encounter (Signed)
See 11/20/15 telephone note, pt has been notified and informed of option to initiate an appeal.  Pt declined appealing as of 11/29/15.

## 2016-01-16 NOTE — Telephone Encounter (Signed)
Pt informed

## 2016-01-16 NOTE — Progress Notes (Signed)
Patient ID: [name] [name], female   DOB: [birth year], 65 y.o.   MRN: [id number]   Expand All Collapse All   Patient ID: [name] [name], female DOB: 07-23-1951, 65 y.o. MRN: [id number]   Subjective: This patient presents for scheduled visit for debridement of pre-ulcerative plantar callus fifth right MPJ  Patient transfers from wheelchair to treatment table  Objective: Orientated 3 BK amputation left Planta keratoses right hallux Large buildup of plantar callus sub-fifth right MPJ with central bleeding area that remains closed after debridement.  no surrounding erythema, edema, warmth surrounding the fifth right MPJ   Assessment:  Diabetic with peripheral arterial disease BK amputation left Pre-ulcerative plantar callus sub-fifth MPJ right  Plan: Debridement of pre-ulcerative plantar callus sub-fifth right MPJ without any bleeding Patient encouraged use wheelchair is much as possible avoid prosthetic limb Patient will wear diabetic shoes with weightbearing with additional padding to offload the fifth right MPJ  Reappoint 4 weeks

## 2016-01-16 NOTE — Patient Instructions (Signed)
Diabetes and Foot Care Diabetes may cause you to have problems because of poor blood supply (circulation) to your feet and legs. This may cause the skin on your feet to become thinner, break easier, and heal more slowly. Your skin may become dry, and the skin may peel and crack. You may also have nerve damage in your legs and feet causing decreased feeling in them. You may not notice minor injuries to your feet that could lead to infections or more serious problems. Taking care of your feet is one of the most important things you can do for yourself.  HOME CARE INSTRUCTIONS  Wear shoes at all times, even in the house. Do not go barefoot. Bare feet are easily injured.  Check your feet daily for blisters, cuts, and redness. If you cannot see the bottom of your feet, use a mirror or ask someone for help.  Wash your feet with warm water (do not use hot water) and mild soap. Then pat your feet and the areas between your toes until they are completely dry. Do not soak your feet as this can dry your skin.  Apply a moisturizing lotion or petroleum jelly (that does not contain alcohol and is unscented) to the skin on your feet and to dry, brittle toenails. Do not apply lotion between your toes.  Trim your toenails straight across. Do not dig under them or around the cuticle. File the edges of your nails with an emery board or nail file.  Do not cut corns or calluses or try to remove them with medicine.  Wear clean socks or stockings every day. Make sure they are not too tight. Do not wear knee-high stockings since they may decrease blood flow to your legs.  Wear shoes that fit properly and have enough cushioning. To break in new shoes, wear them for just a few hours a day. This prevents you from injuring your feet. Always look in your shoes before you put them on to be sure there are no objects inside.  Do not cross your legs. This may decrease the blood flow to your feet.  If you find a minor scrape,  cut, or break in the skin on your feet, keep it and the skin around it clean and dry. These areas may be cleansed with mild soap and water. Do not cleanse the area with peroxide, alcohol, or iodine.  When you remove an adhesive bandage, be sure not to damage the skin around it.  If you have a wound, look at it several times a day to make sure it is healing.  Do not use heating pads or hot water bottles. They may burn your skin. If you have lost feeling in your feet or legs, you may not know it is happening until it is too late.  Make sure your health care provider performs a complete foot exam at least annually or more often if you have foot problems. Report any cuts, sores, or bruises to your health care provider immediately. SEEK MEDICAL CARE IF:   You have an injury that is not healing.  You have cuts or breaks in the skin.  You have an ingrown nail.  You notice redness on your legs or feet.  You feel burning or tingling in your legs or feet.  You have pain or cramps in your legs and feet.  Your legs or feet are numb.  Your feet always feel cold. SEEK IMMEDIATE MEDICAL CARE IF:   There is increasing redness,   swelling, or pain in or around a wound.  There is a red line that goes up your leg.  Pus is coming from a wound.  You develop a fever or as directed by your health care provider.  You notice a bad smell coming from an ulcer or wound.   This information is not intended to replace advice given to you by your health care provider. Make sure you discuss any questions you have with your health care provider.   Document Released: 09/12/2000 Document Revised: 05/18/2013 Document Reviewed: 02/22/2013 Elsevier Interactive Patient Education 2016 [employer]  

## 2016-01-25 ENCOUNTER — Ambulatory Visit: Payer: Medicaid Other | Admitting: Internal Medicine

## 2016-02-13 ENCOUNTER — Encounter: Payer: Self-pay | Admitting: Podiatry

## 2016-02-13 ENCOUNTER — Ambulatory Visit (INDEPENDENT_AMBULATORY_CARE_PROVIDER_SITE_OTHER): Payer: Medicaid Other | Admitting: Podiatry

## 2016-02-13 DIAGNOSIS — L84 Corns and callosities: Secondary | ICD-10-CM

## 2016-02-13 DIAGNOSIS — Q828 Other specified congenital malformations of skin: Secondary | ICD-10-CM | POA: Diagnosis not present

## 2016-02-13 DIAGNOSIS — E1151 Type 2 diabetes mellitus with diabetic peripheral angiopathy without gangrene: Secondary | ICD-10-CM | POA: Diagnosis not present

## 2016-02-13 DIAGNOSIS — M79674 Pain in right toe(s): Secondary | ICD-10-CM | POA: Diagnosis not present

## 2016-02-13 DIAGNOSIS — B351 Tinea unguium: Secondary | ICD-10-CM | POA: Diagnosis not present

## 2016-02-13 NOTE — Progress Notes (Signed)
Patient ID: [name] [name], female   DOB: Jan 06, 1951, 66 y.o.   MRN: [id number]   Expand All Collapse All  Patient ID: [name] [name], female DOB: Jan 08, 1951, 65 y.o. MRN: [id number]   Subjective: This patient presents for scheduled visit for debridement of pre-ulcerative plantar callus fifth right MPJ And painful toenails Patient transfers from wheelchair to treatment table  Objective: Orientated 3 BK amputation left Planta keratoses right hallux Large buildup of plantar callus sub-fifth right MPJ with central bleeding area that remains closed after debridement. no surrounding erythema, edema, warmth surrounding the fifth right MPJ Hypertrophic, deformed, discolored toenails 1-5 right  Assessment:  Diabetic with peripheral arterial disease BK amputation left Pre-ulcerative plantar callus sub-fifth MPJ right Symptomatic onychomycoses 1-5 right  Plan: Debridement of pre-ulcerative plantar callus sub-fifth right MPJ without any bleeding Debridement of toenails 1-5 right mechanically and electronically without any bleeding  Patient encouraged use wheelchair is much as possible avoid prosthetic limb Patient will wear diabetic shoes with weightbearing with additional padding to offload the fifth right MPJ  Reappoint 4 weeks

## 2016-02-13 NOTE — Patient Instructions (Signed)
Diabetes and Foot Care Diabetes may cause you to have problems because of poor blood supply (circulation) to your feet and legs. This may cause the skin on your feet to become thinner, break easier, and heal more slowly. Your skin may become dry, and the skin may peel and crack. You may also have nerve damage in your legs and feet causing decreased feeling in them. You may not notice minor injuries to your feet that could lead to infections or more serious problems. Taking care of your feet is one of the most important things you can do for yourself.  HOME CARE INSTRUCTIONS  Wear shoes at all times, even in the house. Do not go barefoot. Bare feet are easily injured.  Check your feet daily for blisters, cuts, and redness. If you cannot see the bottom of your feet, use a mirror or ask someone for help.  Wash your feet with warm water (do not use hot water) and mild soap. Then pat your feet and the areas between your toes until they are completely dry. Do not soak your feet as this can dry your skin.  Apply a moisturizing lotion or petroleum jelly (that does not contain alcohol and is unscented) to the skin on your feet and to dry, brittle toenails. Do not apply lotion between your toes.  Trim your toenails straight across. Do not dig under them or around the cuticle. File the edges of your nails with an emery board or nail file.  Do not cut corns or calluses or try to remove them with medicine.  Wear clean socks or stockings every day. Make sure they are not too tight. Do not wear knee-high stockings since they may decrease blood flow to your legs.  Wear shoes that fit properly and have enough cushioning. To break in new shoes, wear them for just a few hours a day. This prevents you from injuring your feet. Always look in your shoes before you put them on to be sure there are no objects inside.  Do not cross your legs. This may decrease the blood flow to your feet.  If you find a minor scrape,  cut, or break in the skin on your feet, keep it and the skin around it clean and dry. These areas may be cleansed with mild soap and water. Do not cleanse the area with peroxide, alcohol, or iodine.  When you remove an adhesive bandage, be sure not to damage the skin around it.  If you have a wound, look at it several times a day to make sure it is healing.  Do not use heating pads or hot water bottles. They may burn your skin. If you have lost feeling in your feet or legs, you may not know it is happening until it is too late.  Make sure your health care provider performs a complete foot exam at least annually or more often if you have foot problems. Report any cuts, sores, or bruises to your health care provider immediately. SEEK MEDICAL CARE IF:   You have an injury that is not healing.  You have cuts or breaks in the skin.  You have an ingrown nail.  You notice redness on your legs or feet.  You feel burning or tingling in your legs or feet.  You have pain or cramps in your legs and feet.  Your legs or feet are numb.  Your feet always feel cold. SEEK IMMEDIATE MEDICAL CARE IF:   There is increasing redness,   swelling, or pain in or around a wound.  There is a red line that goes up your leg.  Pus is coming from a wound.  You develop a fever or as directed by your health care provider.  You notice a bad smell coming from an ulcer or wound.   This information is not intended to replace advice given to you by your health care provider. Make sure you discuss any questions you have with your health care provider.   Document Released: 09/12/2000 Document Revised: 05/18/2013 Document Reviewed: 02/22/2013 Elsevier Interactive Patient Education 2016 [employer]  

## 2016-02-19 ENCOUNTER — Other Ambulatory Visit: Payer: Self-pay

## 2016-02-19 MED ORDER — DIPHENOXYLATE-ATROPINE 2.5-0.025 MG/5ML PO LIQD: 5.0000 mL | Freq: Four times a day (QID) | ORAL | Status: DC | PRN

## 2016-02-20 NOTE — Telephone Encounter (Signed)
Called into pharmacy and notified patient.

## 2016-02-27 ENCOUNTER — Other Ambulatory Visit: Payer: Self-pay | Admitting: Internal Medicine

## 2016-02-27 DIAGNOSIS — M79609 Pain in unspecified limb: Secondary | ICD-10-CM

## 2016-02-27 DIAGNOSIS — T8789 Other complications of amputation stump: Secondary | ICD-10-CM

## 2016-02-27 NOTE — Telephone Encounter (Signed)
Last filled 4/4 No scheduled future appt Last uds 4/4

## 2016-02-27 NOTE — Telephone Encounter (Signed)
NEEDS PAIN MEDS REFILLED,

## 2016-02-28 NOTE — Telephone Encounter (Signed)
Pt needs to be seen in clinic to discuss pain medications due to inappropriate UDS.  Thanks.   Julious Oka, MD Internal Medicine Resident, PGY [facility] Pager: [phone]

## 2016-02-28 NOTE — Telephone Encounter (Signed)
Called pt, confirmed 6/21 appt, informed her that dr Hulen Luster will speak to her then about her prescription, she was agreeable

## 2016-03-12 ENCOUNTER — Ambulatory Visit: Payer: Medicaid Other | Admitting: Podiatry

## 2016-03-12 ENCOUNTER — Telehealth: Payer: Self-pay | Admitting: *Deleted

## 2016-03-12 NOTE — Telephone Encounter (Signed)
Pt states transportation didn't pick up, they made her a new appt 03/26/2016 at 2:00pm.

## 2016-03-19 ENCOUNTER — Ambulatory Visit (INDEPENDENT_AMBULATORY_CARE_PROVIDER_SITE_OTHER): Payer: Medicaid Other | Admitting: Internal Medicine

## 2016-03-19 ENCOUNTER — Other Ambulatory Visit: Payer: Self-pay | Admitting: Internal Medicine

## 2016-03-19 ENCOUNTER — Encounter: Payer: Self-pay | Admitting: Internal Medicine

## 2016-03-19 VITALS — BP 140/76 | HR 61 | Temp 98.3°F | Resp 20 | Ht 61.0 in | Wt 124.8 lb

## 2016-03-19 DIAGNOSIS — Z9181 History of falling: Secondary | ICD-10-CM

## 2016-03-19 DIAGNOSIS — M79605 Pain in left leg: Secondary | ICD-10-CM | POA: Diagnosis not present

## 2016-03-19 DIAGNOSIS — T8789 Other complications of amputation stump: Secondary | ICD-10-CM | POA: Diagnosis present

## 2016-03-19 DIAGNOSIS — Z139 Encounter for screening, unspecified: Secondary | ICD-10-CM

## 2016-03-19 DIAGNOSIS — M79609 Pain in unspecified limb: Secondary | ICD-10-CM

## 2016-03-19 MED ORDER — DULOXETINE HCL 30 MG PO CPEP: 30.0000 mg | ORAL_CAPSULE | Freq: Every day | ORAL | Status: DC

## 2016-03-19 NOTE — Patient Instructions (Signed)
You can start taking cymbalta 30mg  daily for your pain.
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
## 2016-03-20 NOTE — Assessment & Plan Note (Addendum)
Pt presents to clinic to pick up her pain prescription. She was last given refill of percocet #30 tabs on 4/4, UDS done at that time was likely tampered with , please see discussion of amputation stump pain from that visit. She is unable to state when she last took percocet and is requesting refill of pain mediations w/ #112 tabs as this is what worked best for her and kept her from having to come to clinic often. Her pain contract was reviewed with her again and she was informed that the plan was to receive #30 tabs per month and never #112 tabs. It was also explained to her that since her UDS have been negative for percocet suggesting that she is not requiring daily doses and positive for THC it was in her best interest to try other alternative pain medications including topical meds such as voltaren gel or cymbalta. She would not listen to me explain other alternative pain medications and wanted to leave. Dr. Lynnae January was asked to see the patient with me as I did not want her to leave unhappy. After a long discussion with patient, myself, and Dr. Lynnae January pt agreed to a trial of cymbalta 30mg  w/ f/u in clinic in one month to either titrate med if it is not controlling her pain or giving her more refills if cymbalta does help.   - f/u in 1 month, can increase cymbalta up to 60mg  if needed - can place referral for pain clinic if she would like, will place red flag in FYI section.

## 2016-03-21 NOTE — Progress Notes (Signed)
Internal Medicine Clinic Attending  I saw and evaluated the patient.  I personally confirmed the key portions of the history and exam documented by Dr. Hulen Luster and I reviewed pertinent patient test results.  The assessment, diagnosis, and plan were formulated together and I agree with the documentation in the resident's note. Ms Souffrant repeatedly asked the same questions over and over. It is possible that this was simply unhappy with the answers she was receiving. However, I wonder if she has cognitive impairment. Ivin Booty reported that Dr Hulen Luster told the pt that her medicine had been electronically sent to the pharmacy. Shortly afterwards, the pt asked Ivin Booty where her script was and Ivin Booty reminded her. As Ivin Booty was pushing her upstairs, the pt started searching her purse for her paper script. I think a mini-cog would be enlightening.

## 2016-03-26 ENCOUNTER — Encounter: Payer: Self-pay | Admitting: Podiatry

## 2016-03-26 ENCOUNTER — Ambulatory Visit (INDEPENDENT_AMBULATORY_CARE_PROVIDER_SITE_OTHER): Payer: Medicaid Other | Admitting: Podiatry

## 2016-03-26 ENCOUNTER — Encounter: Payer: Medicaid Other | Admitting: Internal Medicine

## 2016-03-26 DIAGNOSIS — E1151 Type 2 diabetes mellitus with diabetic peripheral angiopathy without gangrene: Secondary | ICD-10-CM | POA: Diagnosis not present

## 2016-03-26 DIAGNOSIS — B351 Tinea unguium: Secondary | ICD-10-CM

## 2016-03-26 DIAGNOSIS — L84 Corns and callosities: Secondary | ICD-10-CM

## 2016-03-26 DIAGNOSIS — M79674 Pain in right toe(s): Secondary | ICD-10-CM | POA: Diagnosis not present

## 2016-03-26 DIAGNOSIS — Q828 Other specified congenital malformations of skin: Secondary | ICD-10-CM | POA: Diagnosis not present

## 2016-03-26 NOTE — Patient Instructions (Signed)
Diabetes and Foot Care Diabetes may cause you to have problems because of poor blood supply (circulation) to your feet and legs. This may cause the skin on your feet to become thinner, break easier, and heal more slowly. Your skin may become dry, and the skin may peel and crack. You may also have nerve damage in your legs and feet causing decreased feeling in them. You may not notice minor injuries to your feet that could lead to infections or more serious problems. Taking care of your feet is one of the most important things you can do for yourself.  HOME CARE INSTRUCTIONS  Wear shoes at all times, even in the house. Do not go barefoot. Bare feet are easily injured.  Check your feet daily for blisters, cuts, and redness. If you cannot see the bottom of your feet, use a mirror or ask someone for help.  Wash your feet with warm water (do not use hot water) and mild soap. Then pat your feet and the areas between your toes until they are completely dry. Do not soak your feet as this can dry your skin.  Apply a moisturizing lotion or petroleum jelly (that does not contain alcohol and is unscented) to the skin on your feet and to dry, brittle toenails. Do not apply lotion between your toes.  Trim your toenails straight across. Do not dig under them or around the cuticle. File the edges of your nails with an emery board or nail file.  Do not cut corns or calluses or try to remove them with medicine.  Wear clean socks or stockings every day. Make sure they are not too tight. Do not wear knee-high stockings since they may decrease blood flow to your legs.  Wear shoes that fit properly and have enough cushioning. To break in new shoes, wear them for just a few hours a day. This prevents you from injuring your feet. Always look in your shoes before you put them on to be sure there are no objects inside.  Do not cross your legs. This may decrease the blood flow to your feet.  If you find a minor scrape,  cut, or break in the skin on your feet, keep it and the skin around it clean and dry. These areas may be cleansed with mild soap and water. Do not cleanse the area with peroxide, alcohol, or iodine.  When you remove an adhesive bandage, be sure not to damage the skin around it.  If you have a wound, look at it several times a day to make sure it is healing.  Do not use heating pads or hot water bottles. They may burn your skin. If you have lost feeling in your feet or legs, you may not know it is happening until it is too late.  Make sure your health care provider performs a complete foot exam at least annually or more often if you have foot problems. Report any cuts, sores, or bruises to your health care provider immediately. SEEK MEDICAL CARE IF:   You have an injury that is not healing.  You have cuts or breaks in the skin.  You have an ingrown nail.  You notice redness on your legs or feet.  You feel burning or tingling in your legs or feet.  You have pain or cramps in your legs and feet.  Your legs or feet are numb.  Your feet always feel cold. SEEK IMMEDIATE MEDICAL CARE IF:   There is increasing redness,   swelling, or pain in or around a wound.  There is a red line that goes up your leg.  Pus is coming from a wound.  You develop a fever or as directed by your health care provider.  You notice a bad smell coming from an ulcer or wound.   This information is not intended to replace advice given to you by your health care provider. Make sure you discuss any questions you have with your health care provider.   Document Released: 09/12/2000 Document Revised: 05/18/2013 Document Reviewed: 02/22/2013 Elsevier Interactive Patient Education 2016 [employer]  

## 2016-03-27 NOTE — Progress Notes (Signed)
Patient ID: [name] [name], female   DOB: August 09, 1951, 65 y.o.   MRN: [id number]  Subjective: This patient presents for scheduled visit for debridement of pre-ulcerative plantar callus fifth right MPJ And painful toenails Patient transfers from wheelchair to treatment table  Objective: Orientated 3 BK amputation left Planta keratoses right hallux Large buildup of plantar callus sub-fifth right MPJ with central bleeding area that remains closed after debridement. no surrounding erythema, edema, warmth surrounding the fifth right MPJ Hypertrophic, deformed, discolored toenails 1-5 right  Assessment:  Diabetic with peripheral arterial disease BK amputation left Pre-ulcerative plantar callus sub-fifth MPJ right Symptomatic onychomycoses 1-5 right  Plan: Debridement of pre-ulcerative plantar callus sub-fifth right MPJ without any bleeding Debridement of toenails 1-5 right mechanically and electronically without any bleeding Patient encouraged use wheelchair is much as possible avoid prosthetic limb Patient will wear diabetic shoes with weightbearing with additional padding to offload the fifth right MPJ  Reappoint 4 weeks

## 2016-04-02 ENCOUNTER — Ambulatory Visit
Admission: RE | Admit: 2016-04-02 | Discharge: 2016-04-02 | Disposition: A | Discharge status: Home / Self Care | Payer: Medicaid Other | Source: Ambulatory Visit | Attending: Internal Medicine | Admitting: Internal Medicine

## 2016-04-02 DIAGNOSIS — Z139 Encounter for screening, unspecified: Secondary | ICD-10-CM

## 2016-04-23 ENCOUNTER — Ambulatory Visit: Payer: Medicaid Other | Admitting: Podiatry

## 2016-05-07 ENCOUNTER — Encounter: Payer: Self-pay | Admitting: Podiatry

## 2016-05-07 ENCOUNTER — Ambulatory Visit (INDEPENDENT_AMBULATORY_CARE_PROVIDER_SITE_OTHER): Payer: Medicaid Other | Admitting: Podiatry

## 2016-05-07 DIAGNOSIS — E1151 Type 2 diabetes mellitus with diabetic peripheral angiopathy without gangrene: Secondary | ICD-10-CM

## 2016-05-07 DIAGNOSIS — L84 Corns and callosities: Secondary | ICD-10-CM

## 2016-05-07 DIAGNOSIS — I739 Peripheral vascular disease, unspecified: Secondary | ICD-10-CM

## 2016-05-07 NOTE — Patient Instructions (Signed)
Diabetes and Foot Care Diabetes may cause you to have problems because of poor blood supply (circulation) to your feet and legs. This may cause the skin on your feet to become thinner, break easier, and heal more slowly. Your skin may become dry, and the skin may peel and crack. You may also have nerve damage in your legs and feet causing decreased feeling in them. You may not notice minor injuries to your feet that could lead to infections or more serious problems. Taking care of your feet is one of the most important things you can do for yourself.  HOME CARE INSTRUCTIONS  Wear shoes at all times, even in the house. Do not go barefoot. Bare feet are easily injured.  Check your feet daily for blisters, cuts, and redness. If you cannot see the bottom of your feet, use a mirror or ask someone for help.  Wash your feet with warm water (do not use hot water) and mild soap. Then pat your feet and the areas between your toes until they are completely dry. Do not soak your feet as this can dry your skin.  Apply a moisturizing lotion or petroleum jelly (that does not contain alcohol and is unscented) to the skin on your feet and to dry, brittle toenails. Do not apply lotion between your toes.  Trim your toenails straight across. Do not dig under them or around the cuticle. File the edges of your nails with an emery board or nail file.  Do not cut corns or calluses or try to remove them with medicine.  Wear clean socks or stockings every day. Make sure they are not too tight. Do not wear knee-high stockings since they may decrease blood flow to your legs.  Wear shoes that fit properly and have enough cushioning. To break in new shoes, wear them for just a few hours a day. This prevents you from injuring your feet. Always look in your shoes before you put them on to be sure there are no objects inside.  Do not cross your legs. This may decrease the blood flow to your feet.  If you find a minor scrape,  cut, or break in the skin on your feet, keep it and the skin around it clean and dry. These areas may be cleansed with mild soap and water. Do not cleanse the area with peroxide, alcohol, or iodine.  When you remove an adhesive bandage, be sure not to damage the skin around it.  If you have a wound, look at it several times a day to make sure it is healing.  Do not use heating pads or hot water bottles. They may burn your skin. If you have lost feeling in your feet or legs, you may not know it is happening until it is too late.  Make sure your health care provider performs a complete foot exam at least annually or more often if you have foot problems. Report any cuts, sores, or bruises to your health care provider immediately. SEEK MEDICAL CARE IF:   You have an injury that is not healing.  You have cuts or breaks in the skin.  You have an ingrown nail.  You notice redness on your legs or feet.  You feel burning or tingling in your legs or feet.  You have pain or cramps in your legs and feet.  Your legs or feet are numb.  Your feet always feel cold. SEEK IMMEDIATE MEDICAL CARE IF:   There is increasing redness,   swelling, or pain in or around a wound.  There is a red line that goes up your leg.  Pus is coming from a wound.  You develop a fever or as directed by your health care provider.  You notice a bad smell coming from an ulcer or wound.   This information is not intended to replace advice given to you by your health care provider. Make sure you discuss any questions you have with your health care provider.   Document Released: 09/12/2000 Document Revised: 05/18/2013 Document Reviewed: 02/22/2013 Elsevier Interactive Patient Education 2016 [employer]  

## 2016-05-08 NOTE — Progress Notes (Signed)
Patient ID: [name] [name], female   DOB: 1951/09/14, 65 y.o.   MRN: [id number]    Subjective: This patient presents for scheduled visit for debridement of pre-ulcerative plantar callus fifth right MPJ And painful toenails Patient transfers from wheelchair to treatment table  Objective: Orientated 3 BK amputation left Planta keratoses right hallux Large buildup of plantar callus sub-fifth right MPJ with central bleeding area that remains closed after debridement. no surrounding erythema, edema, warmth surrounding the fifth right MPJ Hypertrophic, deformed, discolored toenails 1-5 right  Assessment:  Diabetic with peripheral arterial disease BK amputation left Pre-ulcerative plantar callus sub-fifth MPJ right Symptomatic onychomycoses 1-5 right  Plan: Debridement of pre-ulcerative plantar callus sub-fifth right MPJ without any bleeding Debridement of toenails 1-5 right mechanically and electronically without any bleeding Patient encouraged use wheelchair is much as possible avoid prosthetic limb Patient will wear diabetic shoes with weightbearing with additional padding to offload the fifth right MPJ  Reappoint 4 weeks

## 2016-06-04 ENCOUNTER — Ambulatory Visit (INDEPENDENT_AMBULATORY_CARE_PROVIDER_SITE_OTHER): Payer: Medicaid Other | Admitting: Podiatry

## 2016-06-04 ENCOUNTER — Encounter: Payer: Self-pay | Admitting: Podiatry

## 2016-06-04 DIAGNOSIS — L84 Corns and callosities: Secondary | ICD-10-CM

## 2016-06-04 DIAGNOSIS — Q828 Other specified congenital malformations of skin: Secondary | ICD-10-CM | POA: Diagnosis not present

## 2016-06-04 DIAGNOSIS — E1151 Type 2 diabetes mellitus with diabetic peripheral angiopathy without gangrene: Secondary | ICD-10-CM | POA: Diagnosis not present

## 2016-06-04 NOTE — Patient Instructions (Signed)
Diabetes and Foot Care Diabetes may cause you to have problems because of poor blood supply (circulation) to your feet and legs. This may cause the skin on your feet to become thinner, break easier, and heal more slowly. Your skin may become dry, and the skin may peel and crack. You may also have nerve damage in your legs and feet causing decreased feeling in them. You may not notice minor injuries to your feet that could lead to infections or more serious problems. Taking care of your feet is one of the most important things you can do for yourself.  HOME CARE INSTRUCTIONS  Wear shoes at all times, even in the house. Do not go barefoot. Bare feet are easily injured.  Check your feet daily for blisters, cuts, and redness. If you cannot see the bottom of your feet, use a mirror or ask someone for help.  Wash your feet with warm water (do not use hot water) and mild soap. Then pat your feet and the areas between your toes until they are completely dry. Do not soak your feet as this can dry your skin.  Apply a moisturizing lotion or petroleum jelly (that does not contain alcohol and is unscented) to the skin on your feet and to dry, brittle toenails. Do not apply lotion between your toes.  Trim your toenails straight across. Do not dig under them or around the cuticle. File the edges of your nails with an emery board or nail file.  Do not cut corns or calluses or try to remove them with medicine.  Wear clean socks or stockings every day. Make sure they are not too tight. Do not wear knee-high stockings since they may decrease blood flow to your legs.  Wear shoes that fit properly and have enough cushioning. To break in new shoes, wear them for just a few hours a day. This prevents you from injuring your feet. Always look in your shoes before you put them on to be sure there are no objects inside.  Do not cross your legs. This may decrease the blood flow to your feet.  If you find a minor scrape,  cut, or break in the skin on your feet, keep it and the skin around it clean and dry. These areas may be cleansed with mild soap and water. Do not cleanse the area with peroxide, alcohol, or iodine.  When you remove an adhesive bandage, be sure not to damage the skin around it.  If you have a wound, look at it several times a day to make sure it is healing.  Do not use heating pads or hot water bottles. They may burn your skin. If you have lost feeling in your feet or legs, you may not know it is happening until it is too late.  Make sure your health care provider performs a complete foot exam at least annually or more often if you have foot problems. Report any cuts, sores, or bruises to your health care provider immediately. SEEK MEDICAL CARE IF:   You have an injury that is not healing.  You have cuts or breaks in the skin.  You have an ingrown nail.  You notice redness on your legs or feet.  You feel burning or tingling in your legs or feet.  You have pain or cramps in your legs and feet.  Your legs or feet are numb.  Your feet always feel cold. SEEK IMMEDIATE MEDICAL CARE IF:   There is increasing redness,   swelling, or pain in or around a wound.  There is a red line that goes up your leg.  Pus is coming from a wound.  You develop a fever or as directed by your health care provider.  You notice a bad smell coming from an ulcer or wound.   This information is not intended to replace advice given to you by your health care provider. Make sure you discuss any questions you have with your health care provider.   Document Released: 09/12/2000 Document Revised: 05/18/2013 Document Reviewed: 02/22/2013 Elsevier Interactive Patient Education 2016 [employer]  

## 2016-06-04 NOTE — Progress Notes (Signed)
Patient ID: [name] [name], female   DOB: [birth year], 65 y.o.   MRN: [id number]   Subjective: This patient presents for scheduled visit for debridement of pre-ulcerative plantar callus fifth right MPJ And painful toenails Patient transfers from wheelchair to treatment table  Objective: Orientated 3 BK amputation left Planta keratoses right hallux Large buildup of plantar callus sub-fifth right MPJ with central bleeding area that remains closed after debridement. no surrounding erythema, edema, warmth surrounding the fifth right MPJ Hypertrophic, deformed, discolored toenails 1-5 right  Assessment:  Diabetic with peripheral arterial disease BK amputation left Pre-ulcerative plantar callus sub-fifth MPJ right Symptomatic onychomycoses 1-5 right  Plan: Debridement of pre-ulcerative plantar callus sub-fifth right MPJ without any bleeding Debridement of toenails 1-5 right mechanically and electronically without any bleeding Patient encouraged use wheelchair is much as possible avoid prosthetic limb Patient will wear diabetic shoes with weightbearing with additional padding to offload the fifth right MPJ  Reappoint 4 weeks

## 2016-06-10 ENCOUNTER — Other Ambulatory Visit: Payer: Self-pay | Admitting: Internal Medicine

## 2016-06-12 NOTE — Telephone Encounter (Signed)
Lomotil rx callled to [facility].

## 2016-06-25 ENCOUNTER — Other Ambulatory Visit: Payer: Self-pay | Admitting: Internal Medicine

## 2016-06-25 DIAGNOSIS — R197 Diarrhea, unspecified: Secondary | ICD-10-CM

## 2016-07-02 ENCOUNTER — Ambulatory Visit (INDEPENDENT_AMBULATORY_CARE_PROVIDER_SITE_OTHER): Payer: Medicaid Other | Admitting: Podiatry

## 2016-07-02 ENCOUNTER — Encounter: Payer: Self-pay | Admitting: Podiatry

## 2016-07-02 VITALS — BP 161/87 | HR 60 | Resp 18

## 2016-07-02 DIAGNOSIS — E1151 Type 2 diabetes mellitus with diabetic peripheral angiopathy without gangrene: Secondary | ICD-10-CM | POA: Diagnosis not present

## 2016-07-02 DIAGNOSIS — L84 Corns and callosities: Secondary | ICD-10-CM

## 2016-07-02 DIAGNOSIS — B351 Tinea unguium: Secondary | ICD-10-CM | POA: Diagnosis not present

## 2016-07-02 DIAGNOSIS — I739 Peripheral vascular disease, unspecified: Secondary | ICD-10-CM | POA: Diagnosis not present

## 2016-07-02 DIAGNOSIS — M79674 Pain in right toe(s): Secondary | ICD-10-CM

## 2016-07-02 NOTE — Patient Instructions (Signed)
Diabetes and Foot Care Diabetes may cause you to have problems because of poor blood supply (circulation) to your feet and legs. This may cause the skin on your feet to become thinner, break easier, and heal more slowly. Your skin may become dry, and the skin may peel and crack. You may also have nerve damage in your legs and feet causing decreased feeling in them. You may not notice minor injuries to your feet that could lead to infections or more serious problems. Taking care of your feet is one of the most important things you can do for yourself.  HOME CARE INSTRUCTIONS  Wear shoes at all times, even in the house. Do not go barefoot. Bare feet are easily injured.  Check your feet daily for blisters, cuts, and redness. If you cannot see the bottom of your feet, use a mirror or ask someone for help.  Wash your feet with warm water (do not use hot water) and mild soap. Then pat your feet and the areas between your toes until they are completely dry. Do not soak your feet as this can dry your skin.  Apply a moisturizing lotion or petroleum jelly (that does not contain alcohol and is unscented) to the skin on your feet and to dry, brittle toenails. Do not apply lotion between your toes.  Trim your toenails straight across. Do not dig under them or around the cuticle. File the edges of your nails with an emery board or nail file.  Do not cut corns or calluses or try to remove them with medicine.  Wear clean socks or stockings every day. Make sure they are not too tight. Do not wear knee-high stockings since they may decrease blood flow to your legs.  Wear shoes that fit properly and have enough cushioning. To break in new shoes, wear them for just a few hours a day. This prevents you from injuring your feet. Always look in your shoes before you put them on to be sure there are no objects inside.  Do not cross your legs. This may decrease the blood flow to your feet.  If you find a minor scrape,  cut, or break in the skin on your feet, keep it and the skin around it clean and dry. These areas may be cleansed with mild soap and water. Do not cleanse the area with peroxide, alcohol, or iodine.  When you remove an adhesive bandage, be sure not to damage the skin around it.  If you have a wound, look at it several times a day to make sure it is healing.  Do not use heating pads or hot water bottles. They may burn your skin. If you have lost feeling in your feet or legs, you may not know it is happening until it is too late.  Make sure your health care provider performs a complete foot exam at least annually or more often if you have foot problems. Report any cuts, sores, or bruises to your health care provider immediately. SEEK MEDICAL CARE IF:   You have an injury that is not healing.  You have cuts or breaks in the skin.  You have an ingrown nail.  You notice redness on your legs or feet.  You feel burning or tingling in your legs or feet.  You have pain or cramps in your legs and feet.  Your legs or feet are numb.  Your feet always feel cold. SEEK IMMEDIATE MEDICAL CARE IF:   There is increasing redness,   swelling, or pain in or around a wound.  There is a red line that goes up your leg.  Pus is coming from a wound.  You develop a fever or as directed by your health care provider.  You notice a bad smell coming from an ulcer or wound.   This information is not intended to replace advice given to you by your health care provider. Make sure you discuss any questions you have with your health care provider.   Document Released: 09/12/2000 Document Revised: 05/18/2013 Document Reviewed: 02/22/2013 Elsevier Interactive Patient Education 2016 [employer]  

## 2016-07-02 NOTE — Progress Notes (Signed)
Patient ID: [name] [name], female   DOB: 12/25/1950, 65 y.o.   MRN: [id number]    Subjective: This patient presents for scheduled visit for debridement of pre-ulcerative plantar callus fifth right MPJ And painful toenails Patient transfers from wheelchair to treatment table  Objective: Orientated 3 DP and PT pulses 0/4 right Capillary reflex delayed right Hammertoe 1-5 right Sensation to 10 g monofilament wire intact 2/5 right Vibratory sensation reactive right Ankle reflexes reactive right BK amputation left Planta keratoses right hallux Large buildup of plantar callus sub-fifth right MPJ with central bleeding area that remains closed after debridement. 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

## 2016-07-28 ENCOUNTER — Other Ambulatory Visit: Payer: Self-pay | Admitting: Internal Medicine

## 2016-07-28 DIAGNOSIS — I25118 Atherosclerotic heart disease of native coronary artery with other forms of angina pectoris: Secondary | ICD-10-CM

## 2016-07-28 DIAGNOSIS — I10 Essential (primary) hypertension: Secondary | ICD-10-CM

## 2016-07-28 DIAGNOSIS — I739 Peripheral vascular disease, unspecified: Secondary | ICD-10-CM

## 2016-07-29 ENCOUNTER — Other Ambulatory Visit: Payer: Self-pay | Admitting: Internal Medicine

## 2016-07-29 DIAGNOSIS — E1142 Type 2 diabetes mellitus with diabetic polyneuropathy: Secondary | ICD-10-CM

## 2016-08-01 ENCOUNTER — Telehealth: Payer: Self-pay

## 2016-08-01 ENCOUNTER — Telehealth: Payer: Self-pay | Admitting: Internal Medicine

## 2016-08-01 NOTE — Telephone Encounter (Signed)
This is an inappropriate medication request. She is no longer getting oxy from clinic as she violated the pain contract. She is aware of this. Thank you!  Julious Oka, MD Internal Medicine Resident, PGY 3 [facility] Internal Medicine Program Pager: [phone]

## 2016-08-01 NOTE — Telephone Encounter (Signed)
   Reason for call:   I received a call from [name] at 3:30 AM indicating that she needed a refill of her oxycodone due to stump pain at site of amputation and from hip pain.  She states she is unable to wait until her appointment with PCP on November 30 because pain is so bad.   Pertinent Data:   Previous notes reviewed from her PCP, specifically from 03/19/2016 and 01/01/2016 regarding further opiate prescribing from our clinic.  Explained to patient multiple times that I was unable to send in a prescription for her oxycodone, that she would need a paper prescription if she were to receive this medication, and that such refills would be up to the discretion of her primary provider.   Assessment / Plan / Recommendations:   Patient instructed to return call to clinic during normal business hours and submit request for medication refills.  Explained the process of medication refills to her.  Suggested she try to be seen sooner than 08/28/2016 if her pain is worsening so she could be considered for pain clinic referral if needed or to assess why her pain has worsened.  As always, pt is advised that if symptoms worsen or new symptoms arise, they should go to an urgent care facility or to to ER for further evaluation.   Jule Ser, DO   08/01/2016, 3:38 AM

## 2016-08-01 NOTE — Telephone Encounter (Signed)
Requesting oxycodone to be filled. Please call pt back.

## 2016-08-01 NOTE — Telephone Encounter (Signed)
Dr Hulen Luster, please address the oxycodone, I do not see it on her medlist

## 2016-08-13 ENCOUNTER — Ambulatory Visit (INDEPENDENT_AMBULATORY_CARE_PROVIDER_SITE_OTHER): Payer: Medicaid Other | Admitting: Podiatry

## 2016-08-13 ENCOUNTER — Encounter: Payer: Self-pay | Admitting: Podiatry

## 2016-08-13 VITALS — BP 149/74 | HR 62 | Resp 18

## 2016-08-13 DIAGNOSIS — E1151 Type 2 diabetes mellitus with diabetic peripheral angiopathy without gangrene: Secondary | ICD-10-CM

## 2016-08-13 DIAGNOSIS — B351 Tinea unguium: Secondary | ICD-10-CM

## 2016-08-13 DIAGNOSIS — L84 Corns and callosities: Secondary | ICD-10-CM

## 2016-08-13 NOTE — Progress Notes (Signed)
Patient ID: [name] [name], female   DOB: November 14, 1950, 65 y.o.   MRN: [id number]    Subjective: This patient presents for scheduled visit for debridement of pre-ulcerative plantar callus fifth right MPJ And painful toenails Patient transfers from wheelchair to treatment table. Patient states that her right foot along the plantar callus area has become more uncomfortable because she has to push her wheelchair around more than usual because her home health care provider has been reduced from 3 hours daily to one hour daily. She has difficulty wearing her prosthetic limb and is forced to use a wheelchair for mobility. Her insurance does not provide for motorized wheelchair  Objective: Orientated 3 DP and PT pulses 0/4 right Capillary reflex delayed right Hammertoe 1-5 right Sensation to 10 g monofilament wire intact 2/5 right Vibratory sensation reactive right Ankle reflexes reactive right BK amputation left No open lesions Planta keratoses right hallux Large buildup of plantar callus sub-fifth right MPJ with central bleeding area that remains closed after debridement. 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 Increased pushing on the right foot and wheelchair as cause patient some increase of discomfort in the pre-ulcerative callus on the plantar fifth right MPJ  Plan: Debridement of pre-ulcerative plantar callus sub-fifth right MPJ without any bleeding Debridement of toenails 1-5 right mechanically and electronically without any bleeding Patient encouraged use wheelchair is much as possible avoid prosthetic limb Patient will wear diabetic shoes with weightbearing with additional padding to  offload the fifth right MPJ Also today discussed the option of having home health care increased to her prior level apparently of 3 hours daily to reduce patient's need to push on the right foot and her wheelchair   Reappoint 6  weeks

## 2016-08-13 NOTE — Patient Instructions (Signed)

## 2016-08-22 ENCOUNTER — Other Ambulatory Visit: Payer: Self-pay | Admitting: Internal Medicine

## 2016-08-22 DIAGNOSIS — I429 Cardiomyopathy, unspecified: Secondary | ICD-10-CM

## 2016-08-27 ENCOUNTER — Telehealth: Payer: Self-pay | Admitting: Internal Medicine

## 2016-08-27 NOTE — Telephone Encounter (Signed)
APT. REMINDER CALL, LMTCB °

## 2016-08-28 ENCOUNTER — Encounter: Payer: Self-pay | Admitting: Internal Medicine

## 2016-08-28 ENCOUNTER — Ambulatory Visit (INDEPENDENT_AMBULATORY_CARE_PROVIDER_SITE_OTHER): Payer: Medicaid Other | Admitting: Internal Medicine

## 2016-08-28 VITALS — BP 143/60 | HR 60 | Temp 97.7°F | Wt 125.7 lb

## 2016-08-28 DIAGNOSIS — Z87891 Personal history of nicotine dependence: Secondary | ICD-10-CM | POA: Diagnosis not present

## 2016-08-28 DIAGNOSIS — Z89512 Acquired absence of left leg below knee: Secondary | ICD-10-CM

## 2016-08-28 DIAGNOSIS — R52 Pain, unspecified: Secondary | ICD-10-CM

## 2016-08-28 DIAGNOSIS — M79602 Pain in left arm: Secondary | ICD-10-CM

## 2016-08-28 DIAGNOSIS — Z79899 Other long term (current) drug therapy: Secondary | ICD-10-CM

## 2016-08-28 DIAGNOSIS — Z993 Dependence on wheelchair: Secondary | ICD-10-CM

## 2016-08-28 DIAGNOSIS — R4189 Other symptoms and signs involving cognitive functions and awareness: Secondary | ICD-10-CM

## 2016-08-28 DIAGNOSIS — Z9181 History of falling: Secondary | ICD-10-CM | POA: Diagnosis not present

## 2016-08-28 DIAGNOSIS — I10 Essential (primary) hypertension: Secondary | ICD-10-CM | POA: Diagnosis present

## 2016-08-28 DIAGNOSIS — W19XXXA Unspecified fall, initial encounter: Secondary | ICD-10-CM

## 2016-08-28 MED ORDER — DICLOFENAC SODIUM 1 % TD GEL: 2.0000 g | Freq: Four times a day (QID) | TRANSDERMAL | Status: DC

## 2016-08-28 MED ORDER — ACETAMINOPHEN 325 MG PO TABS: 650.0000 mg | ORAL_TABLET | Freq: Four times a day (QID) | ORAL | 0 refills | Status: DC | PRN

## 2016-08-28 NOTE — Assessment & Plan Note (Addendum)
BP Readings from Last 3 Encounters:  08/28/16 (!) 143/60  08/13/16 (!) 149/74  07/02/16 (!) 161/87    Lab Results  Component Value Date   NA 144 09/17/2015   K 4.4 09/17/2015   CREATININE 1.74 (H) 09/17/2015    Assessment: Blood pressure control:  controlled Progress toward BP goal:   at goal Comments: on bidil, coreg, and lisinopril.   Plan: Medications:  continue current medications Other plans: BMET ordered today, f/u in 3 months.   Update- BMET results stable with previous labs. Follows with [facility]

## 2016-08-28 NOTE — Assessment & Plan Note (Addendum)
Patient reports falling 2 days ago while getting out of her wheelchair. She fell on her left side and bruised her left cheek and left buttock. On physical exam was unable to appreciate any bruising. She states she has tried ice packs. Initially she states that she has not tried tylenol and then later said she did. She was informed of her abnormal urine result with lower than normal creatinine which you would expect in a nl human being. She was also informed that she would no longer get any narcotics from this clinic.   While getting her lab work done she was upset stating that this provider did not examine her hips and that she was upset with her service. Attending Dr. Dareen Piano and I spoke with her again after her lab work was done and re-examined her hips, and left shoulder which were again without any bruising. We also examined both knees, her right knee had a 1 inch old dried scab. On her initial exam she was able to stand up on her own, twist to the side so that I could view her hips, and take off the left side of her shirt all on her own. On second exam she was barely able to take out her arm from her shirt and required assistance to stand up as well as remove her pants. While Dr. Dareen Piano left the room to get a nurse chaperone the patient was making inappropriate comments including, "I don't want a man to see my hips if so I'd rather have a black man," she called this provider a "little girl" then stating she can call me that because she is old enough to be my grandmother. She was not satisfied that she would not be getting narcotics this visit. She has also exhibited inappropriate behavior when she called the on call pager at 3:30 am on 08/01/16 asking for a refill of her hydrocodone for her stump pain. She then called the clinic the following day requesting a refill of this medication. However it was explained to her at her previous visit that she would no longer be getting this medication from our clinic.  Also, while I was out of the room she continually asked Ulis Rias why should would not be getting hydrocodone. Will send this note to Dr. Lynnae January and Tamela Oddi.   - advised to use ice packs more often - rx for tyelonol 650 mg q6h prn - at the end of the exam refused stating that "it is just $3 and will just get it herself" - referral to pain clinic  - offered home PT however pt refused

## 2016-08-28 NOTE — Assessment & Plan Note (Signed)
Concern for cognitive impairment and patient repeatedly asked the same questions over during this exam. she also changed her answers about the exam. For instance it was discussed that she would receive over-the-counter Tylenol. During the end of the exam she stated that she asked for Tylenol No. 3 which was never discussed. During her last visit she repeatedly asked the same questions over and over and reportedly was forgetful of her paperwork. She also has history of medication noncompliance which may be due to cognitive impairment. However this is improved with mail delivery of her medications as her blood pressure is more controlled since this. A Mini-Mental status exam was done and she scored 26 out of 30 which is in with normal limits. Will continue to monitor.
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
## 2016-08-29 LAB — BMP8+ANION GAP
ANION GAP: 18 mmol/L (ref 10.0–18.0)
BUN/Creatinine Ratio: 16 (ref 12–28)
BUN: 37 mg/dL — AB (ref 8–27)
CALCIUM: 9.2 mg/dL (ref 8.7–10.3)
CO2: 19 mmol/L (ref 18–29)
CREATININE: 2.27 mg/dL — AB (ref 0.57–1.00)
Chloride: 106 mmol/L (ref 96–106)
GFR calc Af Amer: 26 mL/min/{1.73_m2} — ABNORMAL LOW (ref >59)
GFR, EST NON AFRICAN AMERICAN: 22 mL/min/{1.73_m2} — AB (ref >59)
Glucose: 102 mg/dL — ABNORMAL HIGH (ref 65–99)
Potassium: 3.7 mmol/L (ref 3.5–5.2)
Sodium: 143 mmol/L (ref 134–144)

## 2016-08-31 NOTE — Progress Notes (Signed)
Internal Medicine Clinic Attending  I saw and evaluated the patient.  I personally confirmed the key portions of the history and exam documented by Dr. Truong and I reviewed pertinent patient test results.  The assessment, diagnosis, and plan were formulated together and I agree with the documentation in the resident's note.  

## 2016-09-04 ENCOUNTER — Encounter: Payer: Self-pay | Admitting: Internal Medicine

## 2016-09-24 ENCOUNTER — Encounter: Payer: Self-pay | Admitting: Podiatry

## 2016-09-24 ENCOUNTER — Ambulatory Visit (INDEPENDENT_AMBULATORY_CARE_PROVIDER_SITE_OTHER): Payer: Medicaid Other | Admitting: Podiatry

## 2016-09-24 VITALS — BP 128/71 | HR 60 | Resp 18

## 2016-09-24 DIAGNOSIS — L84 Corns and callosities: Secondary | ICD-10-CM

## 2016-09-24 DIAGNOSIS — M79674 Pain in right toe(s): Secondary | ICD-10-CM

## 2016-09-24 DIAGNOSIS — E1151 Type 2 diabetes mellitus with diabetic peripheral angiopathy without gangrene: Secondary | ICD-10-CM

## 2016-09-24 DIAGNOSIS — B351 Tinea unguium: Secondary | ICD-10-CM

## 2016-09-24 NOTE — Patient Instructions (Signed)

## 2016-09-24 NOTE — Progress Notes (Signed)
Patient ID: [name] [name], female   DOB: Mar 12, 1951, 65 y.o.   MRN: [id number]    Subjective: This patient presents for scheduled visit for debridement of pre-ulcerative plantar callus fifth right MPJ And painful toenails Patient transfers from wheelchair to treatment table. Patient states that her right foot along the plantar callus area has become more uncomfortable because she has to push her wheelchair around more than usual because her home health care provider has been reduced from 3 hours daily to one hour daily. She has difficulty wearing her prosthetic limb and is forced to use a wheelchair for mobility. Her insurance does not provide for motorized wheelchair  Objective: Orientated 3 DP and PT pulses 0/4 right Capillary reflex delayed right Hammertoe 1-5 right Sensation to 10 g monofilament wire intact 2/5 right Vibratory sensation reactive right Ankle reflexes reactive right BK amputation left No open lesions Planta keratoses right hallux Large buildup of plantar callus sub-fifth right MPJ with central bleeding area that remains closed after debridement. 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

## 2016-11-05 ENCOUNTER — Other Ambulatory Visit: Payer: Self-pay | Admitting: Internal Medicine

## 2016-11-05 NOTE — Telephone Encounter (Signed)
Called to pharm 

## 2016-11-06 ENCOUNTER — Other Ambulatory Visit: Payer: Self-pay | Admitting: Internal Medicine

## 2016-11-06 ENCOUNTER — Encounter: Payer: Self-pay | Admitting: Vascular Surgery

## 2016-11-06 DIAGNOSIS — E1142 Type 2 diabetes mellitus with diabetic polyneuropathy: Secondary | ICD-10-CM

## 2016-11-06 DIAGNOSIS — I10 Essential (primary) hypertension: Secondary | ICD-10-CM

## 2016-11-12 ENCOUNTER — Encounter (HOSPITAL_COMMUNITY): Payer: Medicaid Other

## 2016-11-12 ENCOUNTER — Other Ambulatory Visit (HOSPITAL_COMMUNITY): Payer: Medicaid Other

## 2016-11-12 ENCOUNTER — Ambulatory Visit: Payer: Medicaid Other | Admitting: Family

## 2016-11-12 ENCOUNTER — Encounter: Payer: Self-pay | Admitting: Family

## 2016-11-12 ENCOUNTER — Ambulatory Visit (INDEPENDENT_AMBULATORY_CARE_PROVIDER_SITE_OTHER): Payer: Medicaid Other | Admitting: Family

## 2016-11-12 VITALS — BP 136/84 | HR 60 | Temp 97.6°F | Resp 18 | Ht 61.0 in | Wt 120.0 lb

## 2016-11-12 DIAGNOSIS — Z89512 Acquired absence of left leg below knee: Secondary | ICD-10-CM

## 2016-11-12 DIAGNOSIS — I779 Disorder of arteries and arterioles, unspecified: Secondary | ICD-10-CM

## 2016-11-12 DIAGNOSIS — R0989 Other specified symptoms and signs involving the circulatory and respiratory systems: Secondary | ICD-10-CM

## 2016-11-12 DIAGNOSIS — F17200 Nicotine dependence, unspecified, uncomplicated: Secondary | ICD-10-CM
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
## 2016-11-12 NOTE — Patient Instructions (Signed)
Peripheral Vascular Disease Peripheral vascular disease (PVD) is a disease of the blood vessels that are not part of your heart and brain. A simple term for PVD is poor circulation. In most cases, PVD narrows the blood vessels that carry blood from your heart to the rest of your body. This can result in a decreased supply of blood to your arms, legs, and internal organs, like your stomach or kidneys. However, it most often affects a person's lower legs and feet. There are two types of PVD.  Organic PVD. This is the more common type. It is caused by damage to the structure of blood vessels.  Functional PVD. This is caused by conditions that make blood vessels contract and tighten (spasm). Without treatment, PVD tends to get worse over time. PVD can also lead to acute ischemic limb. This is when an arm or limb suddenly has trouble getting enough blood. This is a medical emergency. Follow these instructions at home:  Take medicines only as told by your doctor.  Do not use any tobacco products, including cigarettes, chewing tobacco, or electronic cigarettes. If you need help quitting, ask your doctor.  Lose weight if you are overweight, and maintain a healthy weight as told by your doctor.  Eat a diet that is low in fat and cholesterol. If you need help, ask your doctor.  Exercise regularly. Ask your doctor for some good activities for you.  Take good care of your feet.  Wear comfortable shoes that fit well.  Check your feet often for any cuts or sores. Contact a doctor if:  You have cramps in your legs while walking.  You have leg pain when you are at rest.  You have coldness in a leg or foot.  Your skin changes.  You are unable to get or have an erection (erectile dysfunction).  You have cuts or sores on your feet that are not healing. Get help right away if:  Your arm or leg turns cold and blue.  Your arms or legs become red, warm, swollen, painful, or numb.  You have  chest pain or trouble breathing.  You suddenly have weakness in your face, arm, or leg.  You become very confused or you cannot speak.  You suddenly have a very bad headache.  You suddenly cannot see. This information is not intended to replace advice given to you by your health care provider. Make sure you discuss any questions you have with your health care provider. Document Released: 12/10/2009 Document Revised: 02/21/2016 Document Reviewed: 02/23/2014 Elsevier Interactive Patient Education  2017 Elsevier Inc.      Steps to Quit Smoking Smoking tobacco can be bad for your health. It can also affect almost every organ in your body. Smoking puts you and people around you at risk for many serious long-lasting (chronic) diseases. Quitting smoking is hard, but it is one of the best things that you can do for your health. It is never too late to quit. What are the benefits of quitting smoking? When you quit smoking, you lower your risk for getting serious diseases and conditions. They can include:  Lung cancer or lung disease.  Heart disease.  Stroke.  Heart attack.  Not being able to have children (infertility).  Weak bones (osteoporosis) and broken bones (fractures). If you have coughing, wheezing, and shortness of breath, those symptoms may get better when you quit. You may also get sick less often. If you are pregnant, quitting smoking can help to lower your chances   of having a baby of low birth weight. What can I do to help me quit smoking? Talk with your doctor about what can help you quit smoking. Some things you can do (strategies) include:  Quitting smoking totally, instead of slowly cutting back how much you smoke over a period of time.  Going to in-person counseling. You are more likely to quit if you go to many counseling sessions.  Using resources and support systems, such as:  Online chats with a counselor.  Phone quitlines.  Printed self-help  materials.  Support groups or group counseling.  Text messaging programs.  Mobile phone apps or applications.  Taking medicines. Some of these medicines may have nicotine in them. If you are pregnant or breastfeeding, do not take any medicines to quit smoking unless your doctor says it is okay. Talk with your doctor about counseling or other things that can help you. Talk with your doctor about using more than one strategy at the same time, such as taking medicines while you are also going to in-person counseling. This can help make quitting easier. What things can I do to make it easier to quit? Quitting smoking might feel very hard at first, but there is a lot that you can do to make it easier. Take these steps:  Talk to your family and friends. Ask them to support and encourage you.  Call phone quitlines, reach out to support groups, or work with a counselor.  Ask people who smoke to not smoke around you.  Avoid places that make you want (trigger) to smoke, such as:  Bars.  Parties.  Smoke-break areas at work.  Spend time with people who do not smoke.  Lower the stress in your life. Stress can make you want to smoke. Try these things to help your stress:  Getting regular exercise.  Deep-breathing exercises.  Yoga.  Meditating.  Doing a body scan. To do this, close your eyes, focus on one area of your body at a time from head to toe, and notice which parts of your body are tense. Try to relax the muscles in those areas.  Download or buy apps on your mobile phone or tablet that can help you stick to your quit plan. There are many free apps, such as QuitGuide from the CDC (Centers for Disease Control and Prevention). You can find more support from smokefree.gov and other websites. This information is not intended to replace advice given to you by your health care provider. Make sure you discuss any questions you have with your health care provider. Document Released:  07/12/2009 Document Revised: 05/13/2016 Document Reviewed: 01/30/2015 Elsevier Interactive Patient Education  2017 Elsevier Inc.  

## 2016-11-14 ENCOUNTER — Encounter: Payer: Self-pay | Admitting: Internal Medicine

## 2016-11-14 DIAGNOSIS — M6530 Trigger finger, unspecified finger: Secondary | ICD-10-CM

## 2016-11-14 HISTORY — DX: Trigger finger, unspecified finger: M65.30

## 2016-11-19 ENCOUNTER — Encounter: Payer: Medicaid Other | Admitting: Internal Medicine

## 2016-11-20 ENCOUNTER — Telehealth: Payer: Self-pay | Admitting: Internal Medicine

## 2016-11-20 NOTE — Addendum Note (Signed)
Addended by: Lianne Cure A on: 11/20/2016 02:52 PM   Modules accepted: Orders

## 2016-11-20 NOTE — Telephone Encounter (Signed)
Patient is requesting a new Teaching laboratory technician.  Patient states it has been over 5 years and she can get a new.  Please advise.

## 2016-11-21 ENCOUNTER — Encounter: Payer: Self-pay | Admitting: Internal Medicine

## 2016-11-21 ENCOUNTER — Other Ambulatory Visit: Payer: Self-pay | Admitting: Pharmacist

## 2016-11-21 ENCOUNTER — Other Ambulatory Visit: Payer: Self-pay | Admitting: Internal Medicine

## 2016-11-21 DIAGNOSIS — Z89519 Acquired absence of unspecified leg below knee: Secondary | ICD-10-CM

## 2016-11-21 DIAGNOSIS — I739 Peripheral vascular disease, unspecified: Secondary | ICD-10-CM

## 2016-11-21 DIAGNOSIS — I25118 Atherosclerotic heart disease of native coronary artery with other forms of angina pectoris: Secondary | ICD-10-CM

## 2016-11-21 DIAGNOSIS — I429 Cardiomyopathy, unspecified: Secondary | ICD-10-CM

## 2016-11-21 NOTE — Telephone Encounter (Signed)
I have placed an DME order for new walker.  Please let me know if anything else needed.  Thanks.

## 2016-11-22 MED ORDER — ISOSORB DINITRATE-HYDRALAZINE 20-37.5 MG PO TABS: 1.0000 | ORAL_TABLET | Freq: Three times a day (TID) | ORAL | 3 refills | Status: DC

## 2016-11-22 MED ORDER — ATORVASTATIN CALCIUM 40 MG PO TABS: 40.0000 mg | ORAL_TABLET | Freq: Every day | ORAL | 3 refills | Status: DC

## 2016-11-25 ENCOUNTER — Encounter: Payer: Self-pay | Admitting: Family

## 2016-11-27 ENCOUNTER — Encounter: Payer: Medicaid Other | Admitting: Internal Medicine

## 2016-12-01 ENCOUNTER — Ambulatory Visit: Payer: Medicaid Other | Admitting: Family

## 2016-12-01 ENCOUNTER — Ambulatory Visit (HOSPITAL_COMMUNITY)
Admission: RE | Admit: 2016-12-01 | Discharge: 2016-12-01 | Disposition: A | Discharge status: Home / Self Care | Payer: Medicaid Other | Source: Ambulatory Visit | Attending: Family | Admitting: Family

## 2016-12-01 ENCOUNTER — Other Ambulatory Visit (HOSPITAL_COMMUNITY): Payer: Medicaid Other

## 2016-12-01 ENCOUNTER — Ambulatory Visit (INDEPENDENT_AMBULATORY_CARE_PROVIDER_SITE_OTHER)
Admission: RE | Admit: 2016-12-01 | Discharge: 2016-12-01 | Disposition: A | Discharge status: Home / Self Care | Payer: Medicaid Other | Source: Ambulatory Visit | Attending: Family | Admitting: Family

## 2016-12-01 ENCOUNTER — Encounter (HOSPITAL_COMMUNITY): Payer: Medicaid Other

## 2016-12-01 DIAGNOSIS — Z89512 Acquired absence of left leg below knee: Secondary | ICD-10-CM | POA: Diagnosis present

## 2016-12-01 DIAGNOSIS — R0989 Other specified symptoms and signs involving the circulatory and respiratory systems: Secondary | ICD-10-CM

## 2016-12-01 DIAGNOSIS — I779 Disorder of arteries and arterioles, unspecified: Secondary | ICD-10-CM | POA: Diagnosis present

## 2016-12-01 DIAGNOSIS — F17200 Nicotine dependence, unspecified, uncomplicated: Secondary | ICD-10-CM

## 2016-12-05 ENCOUNTER — Other Ambulatory Visit: Payer: Self-pay | Admitting: Internal Medicine

## 2016-12-05 DIAGNOSIS — I739 Peripheral vascular disease, unspecified: Secondary | ICD-10-CM

## 2016-12-05 DIAGNOSIS — I25118 Atherosclerotic heart disease of native coronary artery with other forms of angina pectoris: Secondary | ICD-10-CM

## 2016-12-08 NOTE — Addendum Note (Signed)
Addended by: Hulan Fray on: 12/08/2016 12:48 PM   Modules accepted: Orders

## 2016-12-10 ENCOUNTER — Other Ambulatory Visit: Payer: Self-pay | Admitting: Internal Medicine

## 2016-12-10 DIAGNOSIS — W19XXXA Unspecified fall, initial encounter: Secondary | ICD-10-CM

## 2016-12-10 NOTE — Patient Instructions (Signed)
Dear [name] [name],  Your recent Vascular Lab visit on December 01, 2016 indicates: No change compared to last exam on 11-09-2015. Please return in one year for follow up. Our appointment secretary will contact you regarding this visit. She will call you closer to the one year time period.  Thank you, Clemon Chambers, NP           Peripheral Vascular Disease Peripheral vascular disease (PVD) is a disease of the blood vessels that are not part of your heart and brain. A simple term for PVD is poor circulation. In most cases, PVD narrows the blood vessels that carry blood from your heart to the rest of your body. This can result in a decreased supply of blood to your arms, legs, and internal organs, like your stomach or kidneys. However, it most often affects a person's lower legs and feet. There are two types of PVD.  Organic PVD. This is the more common type. It is caused by damage to the structure of blood vessels.  Functional PVD. This is caused by conditions that make blood vessels contract and tighten (spasm). Without treatment, PVD tends to get worse over time. PVD can also lead to acute ischemic limb. This is when an arm or limb suddenly has trouble getting enough blood. This is a medical emergency. What are the causes? Each type of PVD has many different causes. The most common cause of PVD is buildup of a fatty material (plaque) inside of your arteries (atherosclerosis). Small amounts of plaque can break off from the walls of the blood vessels and become lodged in a smaller artery. This blocks blood flow and can cause acute ischemic limb. Other common causes of PVD include:  Blood clots that form inside of blood vessels.  Injuries to blood vessels.  Diseases that cause inflammation of blood vessels or cause blood vessel spasms.  Health behaviors and health history that increase your risk of developing PVD. What increases the risk? You may have a greater risk of PVD if you:  Have  a family history of PVD.  Have certain medical conditions, including:  High cholesterol.  Diabetes.  High blood pressure (hypertension).  Coronary heart disease.  Past problems with blood clots.  Past injury, such as burns or a broken bone. These may have damaged blood vessels in your limbs.  Buerger disease. This is caused by inflamed blood vessels in your hands and feet.  Some forms of arthritis.  Rare birth defects that affect the arteries in your legs.  Use tobacco.  Do not get enough exercise.  Are obese.  Are age 71 or older. What are the signs or symptoms? PVD may cause many different symptoms. Your symptoms depend on what part of your body is not getting enough blood. Some common signs and symptoms include:  Cramps in your lower legs. This may be a symptom of poor leg circulation (claudication).  Pain and weakness in your legs while you are physically active that goes away when you rest (intermittent claudication).  Leg pain when at rest.  Leg numbness, tingling, or weakness.  Coldness in a leg or foot, especially when compared with the other leg.  Skin or hair changes. These can include:  Hair loss.  Shiny skin.  Pale or bluish skin.  Thick toenails.  Inability to get or maintain an erection (erectile dysfunction). People with PVD are more prone to developing ulcers and sores on their toes, feet, or legs. These may take longer than normal to heal.  How is this diagnosed? Your health care provider may diagnose PVD from your signs and symptoms. The health care provider will also do a physical exam. You may have tests to find out what is causing your PVD and determine its severity. Tests may include:  Blood pressure recordings from your arms and legs and measurements of the strength of your pulses (pulse volume recordings).  Imaging studies using sound waves to take pictures of the blood flow through your blood vessels (Doppler ultrasound).  Injecting  a dye into your blood vessels before having imaging studies using:  X-rays (angiogram or arteriogram).  Computer-generated X-rays (CT angiogram).  A powerful electromagnetic field and a computer (magnetic resonance angiogram or MRA). How is this treated? Treatment for PVD depends on the cause of your condition and the severity of your symptoms. It also depends on your age. Underlying causes need to be treated and controlled. These include long-lasting (chronic) conditions, such as diabetes, high cholesterol, and high blood pressure. You may need to first try making lifestyle changes and taking medicines. Surgery may be needed if these do not work. Lifestyle changes may include:  Quitting smoking.  Exercising regularly.  Following a low-fat, low-cholesterol diet. Medicines may include:  Blood thinners to prevent blood clots.  Medicines to improve blood flow.  Medicines to improve your blood cholesterol levels. Surgical procedures may include:  A procedure that uses an inflated balloon to open a blocked artery and improve blood flow (angioplasty).  A procedure to put in a tube (stent) to keep a blocked artery open (stent implant).  Surgery to reroute blood flow around a blocked artery (peripheral bypass surgery).  Surgery to remove dead tissue from an infected wound on the affected limb.  Amputation. This is surgical removal of the affected limb. This may be necessary in cases of acute ischemic limb that are not improved through medical or surgical treatments. Follow these instructions at home:  Take medicines only as directed by your health care provider.  Do not use any tobacco products, including cigarettes, chewing tobacco, or electronic cigarettes. If you need help quitting, ask your health care provider.  Lose weight if you are overweight, and maintain a healthy weight as directed by your health care provider.  Eat a diet that is low in fat and cholesterol. If you need  help, ask your health care provider.  Exercise regularly. Ask your health care provider to suggest some good activities for you.  Use compression stockings or other mechanical devices as directed by your health care provider.  Take good care of your feet.  Wear comfortable shoes that fit well.  Check your feet often for any cuts or sores. Contact a health care provider if:  You have cramps in your legs while walking.  You have leg pain when you are at rest.  You have coldness in a leg or foot.  Your skin changes.  You have erectile dysfunction.  You have cuts or sores on your feet that are not healing. Get help right away if:  Your arm or leg turns cold and blue.  Your arms or legs become red, warm, swollen, painful, or numb.  You have chest pain or trouble breathing.  You suddenly have weakness in your face, arm, or leg.  You become very confused or lose the ability to speak.  You suddenly have a very bad headache or lose your vision. This information is not intended to replace advice given to you by your health care provider. Make  sure you discuss any questions you have with your health care provider. Document Released: 10/23/2004 Document Revised: 02/21/2016 Document Reviewed: 02/23/2014 Elsevier Interactive Patient Education  2017 [employer]. Steps to Quit Smoking Smoking tobacco can be harmful to your health and can affect almost every organ in your body. Smoking puts you, and those around you, at risk for developing many serious chronic diseases. Quitting smoking is difficult, but it is one of the best things that you can do for your health. It is never too late to quit. What are the benefits of quitting smoking? When you quit smoking, you lower your risk of developing serious diseases and conditions, such as:  Lung cancer or lung disease, such as COPD.  Heart disease.  Stroke.  Heart attack.  Infertility.  Osteoporosis and bone  fractures. Additionally, symptoms such as coughing, wheezing, and shortness of breath may get better when you quit. You may also find that you get sick less often because your body is stronger at fighting off colds and infections. If you are pregnant, quitting smoking can help to reduce your chances of having a baby of low birth weight. How do I get ready to quit? When you decide to quit smoking, create a plan to make sure that you are successful. Before you quit:  Pick a date to quit. Set a date within the next two weeks to give you time to prepare.  Write down the reasons why you are quitting. Keep this list in places where you will see it often, such as on your bathroom mirror or in your car or wallet.  Identify the people, places, things, and activities that make you want to smoke (triggers) and avoid them. Make sure to take these actions:  Throw away all cigarettes at home, at work, and in your car.  Throw away smoking accessories, such as Scientist, research (medical).  Clean your car and make sure to empty the ashtray.  Clean your home, including curtains and carpets.  Tell your family, friends, and coworkers that you are quitting. Support from your loved ones can make quitting easier.  Talk with your health care provider about your options for quitting smoking.  Find out what treatment options are covered by your health insurance. What strategies can I use to quit smoking? Talk with your healthcare provider about different strategies to quit smoking. Some strategies include:  Quitting smoking altogether instead of gradually lessening how much you smoke over a period of time. Research shows that quitting "cold [place]" is more successful than gradually quitting.  Attending in-person counseling to help you build problem-solving skills. You are more likely to have success in quitting if you attend several counseling sessions. Even short sessions of 10 minutes can be effective.  Finding  resources and support systems that can help you to quit smoking and remain smoke-free after you quit. These resources are most helpful when you use them often. They can include:  Online chats with a Social worker.  Telephone quitlines.  Printed Furniture conservator/restorer.  Support groups or group counseling.  Text messaging programs.  Mobile phone applications.  Taking medicines to help you quit smoking. (If you are pregnant or breastfeeding, talk with your health care provider first.) Some medicines contain nicotine and some do not. Both types of medicines help with cravings, but the medicines that include nicotine help to relieve withdrawal symptoms. Your health care provider may recommend:  Nicotine patches, gum, or lozenges.  Nicotine inhalers or sprays.  Non-nicotine medicine that is taken  by mouth. Talk with your health care provider about combining strategies, such as taking medicines while you are also receiving in-person counseling. Using these two strategies together makes you more likely to succeed in quitting than if you used either strategy on its own. If you are pregnant or breastfeeding, talk with your health care provider about finding counseling or other support strategies to quit smoking. Do not take medicine to help you quit smoking unless told to do so by your health care provider. What things can I do to make it easier to quit? Quitting smoking might feel overwhelming at first, but there is a lot that you can do to make it easier. Take these important actions:  Reach out to your family and friends and ask that they support and encourage you during this time. Call telephone quitlines, reach out to support groups, or work with a counselor for support.  Ask people who smoke to avoid smoking around you.  Avoid places that trigger you to smoke, such as bars, parties, or smoke-break areas at work.  Spend time around people who do not smoke.  Lessen stress in your life, because  stress can be a smoking trigger for some people. To lessen stress, try:  Exercising regularly.  Deep-breathing exercises.  Yoga.  Meditating.  Performing a body scan. This involves closing your eyes, scanning your body from head to toe, and noticing which parts of your body are particularly tense. Purposefully relax the muscles in those areas.  Download or purchase mobile phone or tablet apps (applications) that can help you stick to your quit plan by providing reminders, tips, and encouragement. There are many free apps, such as QuitGuide from the [employer] Office manager for Disease Control and Prevention). You can find other support for quitting smoking (smoking cessation) through smokefree.gov and other websites. How will I feel when I quit smoking? Within the first 24 hours of quitting smoking, you may start to feel some withdrawal symptoms. These symptoms are usually most noticeable 2-3 days after quitting, but they usually do not last beyond 2-3 weeks. Changes or symptoms that you might experience include:  Mood swings.  Restlessness, anxiety, or irritation.  Difficulty concentrating.  Dizziness.  Strong cravings for sugary foods in addition to nicotine.  Mild weight gain.  Constipation.  Nausea.  Coughing or a sore throat.  Changes in how your medicines work in your body.  A depressed mood.  Difficulty sleeping (insomnia). After the first 2-3 weeks of quitting, you may start to notice more positive results, such as:  Improved sense of smell and taste.  Decreased coughing and sore throat.  Slower heart rate.  Lower blood pressure.  Clearer skin.  The ability to breathe more easily.  Fewer sick days. Quitting smoking is very challenging for most people. Do not get discouraged if you are not successful the first time. Some people need to make many attempts to quit before they achieve long-term success. Do your best to stick to your quit plan, and talk with your health  care provider if you have any questions or concerns. This information is not intended to replace advice given to you by your health care provider. Make sure you discuss any questions you have with your health care provider. Document Released: 09/09/2001 Document Revised: 05/13/2016 Document Reviewed: 01/30/2015 Elsevier Interactive Patient Education  2017 [employer].

## 2016-12-10 NOTE — Progress Notes (Unsigned)
Patient was seen only for her vascular labs. These were reviewed and she will be followed up in 1 year per protocol.

## 2016-12-11 ENCOUNTER — Emergency Department (HOSPITAL_COMMUNITY)
Admission: EM | Admit: 2016-12-11 | Discharge: 2016-12-11 | Disposition: A | Discharge status: Home / Self Care | Payer: Medicaid Other | Attending: Emergency Medicine | Admitting: Emergency Medicine

## 2016-12-11 ENCOUNTER — Encounter (HOSPITAL_COMMUNITY): Payer: Self-pay | Admitting: Emergency Medicine

## 2016-12-11 ENCOUNTER — Emergency Department (HOSPITAL_COMMUNITY): Payer: Medicaid Other

## 2016-12-11 DIAGNOSIS — G4485 Primary stabbing headache: Secondary | ICD-10-CM

## 2016-12-11 DIAGNOSIS — E1140 Type 2 diabetes mellitus with diabetic neuropathy, unspecified: Secondary | ICD-10-CM | POA: Diagnosis not present

## 2016-12-11 DIAGNOSIS — E11319 Type 2 diabetes mellitus with unspecified diabetic retinopathy without macular edema: Secondary | ICD-10-CM | POA: Insufficient documentation

## 2016-12-11 DIAGNOSIS — Z950 Presence of cardiac pacemaker: Secondary | ICD-10-CM | POA: Diagnosis not present

## 2016-12-11 DIAGNOSIS — I129 Hypertensive chronic kidney disease with stage 1 through stage 4 chronic kidney disease, or unspecified chronic kidney disease: Secondary | ICD-10-CM | POA: Diagnosis not present

## 2016-12-11 DIAGNOSIS — I2510 Atherosclerotic heart disease of native coronary artery without angina pectoris: Secondary | ICD-10-CM | POA: Diagnosis not present

## 2016-12-11 DIAGNOSIS — Z96642 Presence of left artificial hip joint: Secondary | ICD-10-CM | POA: Insufficient documentation

## 2016-12-11 DIAGNOSIS — E1151 Type 2 diabetes mellitus with diabetic peripheral angiopathy without gangrene: Secondary | ICD-10-CM | POA: Diagnosis not present

## 2016-12-11 DIAGNOSIS — Z79899 Other long term (current) drug therapy: Secondary | ICD-10-CM | POA: Diagnosis not present

## 2016-12-11 DIAGNOSIS — R51 Headache: Secondary | ICD-10-CM | POA: Diagnosis present

## 2016-12-11 DIAGNOSIS — N184 Chronic kidney disease, stage 4 (severe): Secondary | ICD-10-CM | POA: Diagnosis not present

## 2016-12-11 DIAGNOSIS — E1122 Type 2 diabetes mellitus with diabetic chronic kidney disease: Secondary | ICD-10-CM | POA: Diagnosis not present

## 2016-12-11 LAB — CBC WITH DIFFERENTIAL/PLATELET
BASOS ABS: 0 10*3/uL (ref 0.0–0.1)
Basophils Relative: 0 %
EOS PCT: 8 %
Eosinophils Absolute: 0.6 10*3/uL (ref 0.0–0.7)
HCT: 32.2 % — ABNORMAL LOW (ref 36.0–46.0)
HEMOGLOBIN: 10.3 g/dL — AB (ref 12.0–15.0)
LYMPHS ABS: 2.1 10*3/uL (ref 0.7–4.0)
LYMPHS PCT: 30 %
MCH: 29.9 pg (ref 26.0–34.0)
MCHC: 32 g/dL (ref 30.0–36.0)
MCV: 93.3 fL (ref 78.0–100.0)
Monocytes Absolute: 0.6 10*3/uL (ref 0.1–1.0)
Monocytes Relative: 8 %
NEUTROS ABS: 3.8 10*3/uL (ref 1.7–7.7)
Neutrophils Relative %: 54 %
PLATELETS: 120 10*3/uL — AB (ref 150–400)
RBC: 3.45 MIL/uL — AB (ref 3.87–5.11)
RDW: 14.4 % (ref 11.5–15.5)
WBC: 7 10*3/uL (ref 4.0–10.5)

## 2016-12-11 LAB — BASIC METABOLIC PANEL
ANION GAP: 7 (ref 5–15)
BUN: 71 mg/dL — ABNORMAL HIGH (ref 6–20)
CALCIUM: 8.7 mg/dL — AB (ref 8.9–10.3)
CO2: 20 mmol/L — ABNORMAL LOW (ref 22–32)
Chloride: 114 mmol/L — ABNORMAL HIGH (ref 101–111)
Creatinine, Ser: 2.98 mg/dL — ABNORMAL HIGH (ref 0.44–1.00)
GFR calc non Af Amer: 16 mL/min — ABNORMAL LOW (ref >60)
GFR, EST AFRICAN AMERICAN: 18 mL/min — AB (ref >60)
Glucose, Bld: 88 mg/dL (ref 65–99)
Potassium: 4.4 mmol/L (ref 3.5–5.1)
SODIUM: 141 mmol/L (ref 135–145)

## 2016-12-11 LAB — SEDIMENTATION RATE: Sed Rate: 16 mm/hr (ref 0–22)

## 2016-12-11 MED ORDER — HYDROCODONE-ACETAMINOPHEN 5-325 MG PO TABS: 2.0000 | ORAL_TABLET | Freq: Four times a day (QID) | ORAL | 0 refills | Status: DC | PRN

## 2016-12-11 MED ORDER — DIPHENHYDRAMINE HCL 50 MG/ML IJ SOLN
25.0000 mg | Freq: Once | INTRAMUSCULAR | Status: AC
  Administered 2016-12-11: 25 mg via INTRAVENOUS
  Filled 2016-12-11: qty 1

## 2016-12-11 MED ORDER — METOCLOPRAMIDE HCL 5 MG/ML IJ SOLN
10.0000 mg | Freq: Once | INTRAMUSCULAR | Status: AC
Start: 2016-12-11 — End: 2016-12-11
  Administered 2016-12-11: 10 mg via INTRAVENOUS
  Filled 2016-12-11: qty 2

## 2016-12-11 NOTE — Discharge Instructions (Signed)

## 2016-12-11 NOTE — ED Triage Notes (Signed)
Pt arrived from home with EMS with reports of headache over the last 3 days, worsening this morning. Pt states that it starts in the back of her head and radiates to the front and down her neck. Denies N/V/D, dizziness, or blurred vision. States she does have a hx of HTN. Home medications at bedside.
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
## 2016-12-11 NOTE — ED Notes (Signed)
Patient transported to CT 

## 2016-12-16 NOTE — Addendum Note (Signed)
Addended by: Lianne Cure A on: 12/16/2016 03:16 PM   Modules accepted: Orders

## 2016-12-24 ENCOUNTER — Encounter: Payer: Self-pay | Admitting: Podiatry

## 2016-12-24 ENCOUNTER — Ambulatory Visit (INDEPENDENT_AMBULATORY_CARE_PROVIDER_SITE_OTHER): Payer: Medicaid Other | Admitting: Podiatry

## 2016-12-24 DIAGNOSIS — B351 Tinea unguium: Secondary | ICD-10-CM | POA: Diagnosis not present

## 2016-12-24 DIAGNOSIS — M79676 Pain in unspecified toe(s): Secondary | ICD-10-CM

## 2016-12-24 DIAGNOSIS — L84 Corns and callosities: Secondary | ICD-10-CM

## 2016-12-24 DIAGNOSIS — E1151 Type 2 diabetes mellitus with diabetic peripheral angiopathy without gangrene: Secondary | ICD-10-CM

## 2016-12-24 NOTE — Progress Notes (Signed)
This patient presents to the office with chief complaint of a painful right foot. She states that she has had long thick nails, which are causing pain and discomfort. She also has 2 calluses noted on her right foot. Patient has a history of an amputation of her left lower leg. She presents the office today for continued evaluation and treatment of her right foot   GENERAL APPEARANCE: Alert, conversant. Appropriately groomed. No acute distress.  VASCULAR: Pedal pulses are  Not  palpable at  [facility] and PT . Capillary refill time is diminished.,  Cold feet noted.  NEUROLOGIC: sensation is normal to 5.07 monofilament at 5/5 sites bilateral.  Light touch is intact bilateral, Muscle strength normal.  MUSCULOSKELETAL: acceptable muscle strength, tone and stability bilateral.  Intrinsic muscluature intact bilateral.  Rectus appearance of foot and digits noted bilateral. BK amputation left leg.  Hammer toe 2-5 right and hallux malleus.    DERMATOLOGIC: skin color, texture, and turgor are within normal limits.  No preulcerative lesions or ulcers  are seen, no interdigital maceration noted.  No open lesions present.   No drainage noted. Callus distal right hallux.  Diffuse pre-ulcerous callus sub 5th right foot.  Nails  Thick disfigured discolored nails right foot.  Onychomycosis  B/L  Debridement of callus  Right foot.  Debride nails right.  Debride callus right.  RTC 3 months.     Gardiner Barefoot DPM

## 2016-12-30 ENCOUNTER — Telehealth: Payer: Self-pay | Admitting: *Deleted

## 2016-12-30 NOTE — Telephone Encounter (Signed)
Spoke to patient about cancelling appt with Dr.Taylor and scheduling appt with Dr.Klein. Patient stated that she was unable to cancel her appt for 4/5 bc she's already called transportation about the appt. I tried to explain the reason why the appt needed to be moved, but patient preferred that it not be moved. Patient to follow up as scheduled.

## 2016-12-30 NOTE — Telephone Encounter (Signed)
LMTCB/sss  Patient needs f/u with Dr.Klein instead of Dr.Taylor

## 2017-01-01 ENCOUNTER — Other Ambulatory Visit: Payer: Self-pay

## 2017-01-01 ENCOUNTER — Encounter: Payer: Self-pay | Admitting: Internal Medicine

## 2017-01-01 ENCOUNTER — Other Ambulatory Visit: Payer: Self-pay | Admitting: *Deleted

## 2017-01-01 ENCOUNTER — Ambulatory Visit (INDEPENDENT_AMBULATORY_CARE_PROVIDER_SITE_OTHER): Payer: Medicaid Other | Admitting: Internal Medicine

## 2017-01-01 VITALS — BP 144/70 | HR 64 | Ht 62.0 in | Wt 126.4 lb

## 2017-01-01 DIAGNOSIS — Z950 Presence of cardiac pacemaker: Secondary | ICD-10-CM | POA: Diagnosis not present

## 2017-01-01 DIAGNOSIS — Z48812 Encounter for surgical aftercare following surgery on the circulatory system: Secondary | ICD-10-CM

## 2017-01-01 DIAGNOSIS — I442 Atrioventricular block, complete: Secondary | ICD-10-CM

## 2017-01-01 DIAGNOSIS — I739 Peripheral vascular disease, unspecified: Secondary | ICD-10-CM

## 2017-01-01 DIAGNOSIS — N185 Chronic kidney disease, stage 5: Secondary | ICD-10-CM
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
## 2017-01-01 NOTE — Patient Instructions (Signed)
Medication Instructions:  Your physician recommends that you continue on your current medications as directed. Please refer to the Current Medication list given to you today.   Labwork: none  Testing/Procedures: none  Follow-Up: Remote monitoring is used to monitor your Pacemaker of ICD from home. This monitoring reduces the number of office visits required to check your device to one time per year. It allows [place] to keep an eye on the functioning of your device to ensure it is working properly. You are scheduled for a device check from home on 04/02/17. You may send your transmission at any time that day. If you have a wireless device, the transmission will be sent automatically. After your physician reviews your transmission, you will receive a postcard with your next transmission date.  Your physician wants you to follow-up in: 12 months.  You will receive a reminder letter in the mail two months in advance. If you don't receive a letter, please call our office to schedule the follow-up appointment.   Any Other Special Instructions Will Be Listed Below (If Applicable).     If you need a refill on your cardiac medications before your next appointment, please call your pharmacy.

## 2017-01-08 ENCOUNTER — Telehealth: Payer: Self-pay

## 2017-01-08 DIAGNOSIS — I739 Peripheral vascular disease, unspecified: Secondary | ICD-10-CM

## 2017-01-08 DIAGNOSIS — I25118 Atherosclerotic heart disease of native coronary artery with other forms of angina pectoris: Secondary | ICD-10-CM

## 2017-01-08 MED ORDER — ATORVASTATIN CALCIUM 40 MG PO TABS: 40.0000 mg | ORAL_TABLET | Freq: Every day | ORAL | 3 refills | Status: DC

## 2017-01-08 NOTE — Telephone Encounter (Signed)
Pt only needs [place] representative for [employer]. All other medications to [facility].

## 2017-01-08 NOTE — Telephone Encounter (Signed)
Patient was contacted by Florinda Marker, PharmD candidate. I agree with the assessment and plan of care documented.  Transferred atorvastatin prescription to [facility]

## 2017-01-15 ENCOUNTER — Encounter: Payer: Medicaid Other | Admitting: Internal Medicine

## 2017-01-19 LAB — CUP PACEART INCLINIC DEVICE CHECK
Date Time Interrogation Session: 20180405144607
Implantable Lead Implant Date: 20110225
Implantable Lead Location: 753860
Implantable Pulse Generator Implant Date: 20110225
Lead Channel Impedance Value: 525 Ohm
Lead Channel Pacing Threshold Amplitude: 0.75 V
Lead Channel Pacing Threshold Pulse Width: 0.4 ms
Lead Channel Setting Pacing Amplitude: 2 V
Lead Channel Setting Sensing Sensitivity: 4 mV
MDC IDC LEAD IMPLANT DT: 20110225
MDC IDC LEAD LOCATION: 753859
MDC IDC MSMT BATTERY VOLTAGE: 2.86 V
MDC IDC MSMT LEADCHNL RA IMPEDANCE VALUE: 437.5 Ohm
MDC IDC MSMT LEADCHNL RA PACING THRESHOLD AMPLITUDE: 0.5 V
MDC IDC MSMT LEADCHNL RA PACING THRESHOLD PULSEWIDTH: 0.4 ms
MDC IDC MSMT LEADCHNL RA SENSING INTR AMPL: 2.7 mV
MDC IDC PG SERIAL: 7114075
MDC IDC SET LEADCHNL RV PACING AMPLITUDE: 2.5 V
MDC IDC SET LEADCHNL RV PACING PULSEWIDTH: 0.4 ms
MDC IDC STAT BRADY RA PERCENT PACED: 96 %
MDC IDC STAT BRADY RV PERCENT PACED: 99.99 %

## 2017-01-21 ENCOUNTER — Encounter: Payer: Self-pay | Admitting: Vascular Surgery

## 2017-01-27 DIAGNOSIS — M75100 Unspecified rotator cuff tear or rupture of unspecified shoulder, not specified as traumatic: Secondary | ICD-10-CM

## 2017-01-27 HISTORY — DX: Unspecified rotator cuff tear or rupture of unspecified shoulder, not specified as traumatic: M75.100

## 2017-01-28 ENCOUNTER — Encounter: Payer: Self-pay | Admitting: Vascular Surgery

## 2017-01-28 ENCOUNTER — Ambulatory Visit (HOSPITAL_COMMUNITY)
Admission: RE | Admit: 2017-01-28 | Discharge: 2017-01-28 | Disposition: A | Discharge status: Home / Self Care | Payer: Medicaid Other | Source: Ambulatory Visit | Attending: Vascular Surgery | Admitting: Vascular Surgery

## 2017-01-28 ENCOUNTER — Ambulatory Visit (INDEPENDENT_AMBULATORY_CARE_PROVIDER_SITE_OTHER): Payer: Medicaid Other | Admitting: Vascular Surgery

## 2017-01-28 ENCOUNTER — Ambulatory Visit (INDEPENDENT_AMBULATORY_CARE_PROVIDER_SITE_OTHER)
Admission: RE | Admit: 2017-01-28 | Discharge: 2017-01-28 | Disposition: A | Discharge status: Home / Self Care | Payer: Medicaid Other | Source: Ambulatory Visit | Attending: Vascular Surgery | Admitting: Vascular Surgery

## 2017-01-28 VITALS — BP 109/67 | HR 58 | Temp 98.3°F | Resp 16 | Ht 62.0 in | Wt 126.0 lb

## 2017-01-28 DIAGNOSIS — N184 Chronic kidney disease, stage 4 (severe): Secondary | ICD-10-CM

## 2017-01-28 DIAGNOSIS — N185 Chronic kidney disease, stage 5: Secondary | ICD-10-CM | POA: Diagnosis not present
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
## 2017-02-10 ENCOUNTER — Other Ambulatory Visit: Payer: Self-pay | Admitting: Internal Medicine

## 2017-02-10 DIAGNOSIS — I10 Essential (primary) hypertension: Secondary | ICD-10-CM

## 2017-02-10 DIAGNOSIS — E1142 Type 2 diabetes mellitus with diabetic polyneuropathy: Secondary | ICD-10-CM

## 2017-02-19 DIAGNOSIS — M65331 Trigger finger, right middle finger: Secondary | ICD-10-CM | POA: Diagnosis not present

## 2017-02-20 ENCOUNTER — Other Ambulatory Visit: Payer: Self-pay | Admitting: Family Medicine

## 2017-02-20 DIAGNOSIS — Z1231 Encounter for screening mammogram for malignant neoplasm of breast: Secondary | ICD-10-CM

## 2017-02-26 NOTE — Telephone Encounter (Signed)
Entered in error. Dr. Hulen Luster

## 2017-03-03 ENCOUNTER — Encounter (HOSPITAL_COMMUNITY): Payer: Self-pay | Admitting: *Deleted

## 2017-03-03 ENCOUNTER — Other Ambulatory Visit: Payer: Self-pay | Admitting: Orthopedic Surgery

## 2017-03-03 NOTE — Progress Notes (Signed)
[name] [name] denies chest pain or shortness or breath. Patient has a history of diabetes, no longer is on medication for diabetes. [name] [name] has a pacemaker, Device clinic does not think patient will need post op interrogation.  Brain Small notified, "call me if you need me."  [name] [name]'s case has been moved to a later time, she was suppose to arrive at 7:30, patient called the [facility], and they were unable to change pick up time.

## 2017-03-03 NOTE — Progress Notes (Signed)
Anesthesia chart review:  Patient is a same day work up.  Patient is a 66 year old female scheduled for right long finger trigger finger release/a-1 pulley with flexor synovectomy on 03/04/2017 with Charlotte Crumb, M.D.  - Receives primary care at the [facility]. Last office visit 08/28/16.  - EP cardiologist is [name], MD. Last office visit 01/01/17.  His note indicates pt stable from CV standpoint, no SOB, no CP, CV and resp exam normal.    PMH includes: cardiac arrest (5974 due to metabolic causes, temporary pacemaker placed; again in 2011 - permanent pacemaker placed), CAD, cardiomyopathy, complete heart block, pacemaker ([employer]. 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  Current smoker.   - Hospitalized 2/4-10/16 for acute on chronic combined systolic and diastolic CHF. Complicated by pulmonary edema and cardiomyopathy (EF 20-25% by echo 11/03/14, improved to 51% on stress test 11/07/14). Peter Martinique, M.D. with cardiology evaluated patient during this hospitalization. His note 11/07/14 states: "I personally reviewed her Echo and Nuclear studies. By Echo on 2/5 her EF was significantly reduced- probably about 30%. By nuclear study EF improved to 51%. I wonder whether her infection was playing a role or perhaps uncontrolled HTN. At any rate she is much better and we can continue medical therapy without further cardiac workup."  Medications include: ASA 81 mg, Lipitor, carvedilol, Lasix, isosorbide-hydralazine, lisinopril  Labs will be obtained DOS.  EKG 01/01/17: Electronic atrial pacemaker. LBBB.  Carotid duplex 11/09/15: B ICA velocities suggest <40% stenosis  Nuclear stress test 11/07/14:  1. Small in size, mild in intensity defect involving the mid and distal anterior and anterolateral wall. There is reduced uptake overall in all areas on stress images as compared to  rest images so anterior defect could be artifactual. The polar plot demonstrates this as a very small area of ischemia in the anterolateral wall. 2.  Low normal left ventricular wall motion. 3. Left ventricular ejection fraction 51% 4. Low-risk stress test findings  Echo 11/03/14:  - Left ventricle: The cavity size was normal. Wall thickness wasincreased in a pattern of mild LVH. Systolic function wasseverely reduced. The estimated ejection fraction was in therange of 20% to 25%. Diffuse hypokinesis. Doppler parameters areconsistent with restrictive physiology, indicative of decreasedleft ventricular diastolic compliance and/or increased left atrial pressure. Doppler parameters are consistent with highventricular filling pressure. - Mitral valve: There was mild regurgitation. - Left atrium: The atrium was mildly dilated. - Right ventricle: Systolic function was mildly reduced. - Tricuspid valve: There was moderate regurgitation. - Pulmonary arteries: Systolic pressure was severely increased. PApeak pressure: 68 mm Hg (S). - Pericardium, extracardiac: A trivial pericardial effusion wasidentified. - Impressions: Severe global reduction in LV function; restrictive filling; mildLAE; mild MR; mildly reduced RV function; moderate TR; severelyelevated pulmonary pressure.  Cardiac cath 06/17/04: 1.  Preserved overall left ventricular systolic function. 2.  No critical coronary obstruction at the present time. (Mid first diagonal with 50-70%; OM 40%).  If labs acceptable DOS, I anticipate pt can proceed as scheduled.   Willeen Cass, FNP-BC [facility]/Anesthesiology Phone: [phone] 03/03/2017 11:49 AM

## 2017-03-04 ENCOUNTER — Ambulatory Visit (HOSPITAL_COMMUNITY)
Admission: RE | Admit: 2017-03-04 | Discharge: 2017-03-04 | Disposition: A | Discharge status: Home / Self Care | Payer: Medicaid Other | Source: Ambulatory Visit | Attending: Orthopedic Surgery | Admitting: Orthopedic Surgery

## 2017-03-04 ENCOUNTER — Encounter (HOSPITAL_COMMUNITY): Payer: Self-pay | Admitting: *Deleted

## 2017-03-04 ENCOUNTER — Ambulatory Visit (HOSPITAL_COMMUNITY): Payer: Medicaid Other | Admitting: Emergency Medicine

## 2017-03-04 ENCOUNTER — Encounter (HOSPITAL_COMMUNITY)
Admission: RE | Disposition: A | Discharge status: Home / Self Care | Payer: Self-pay | Source: Ambulatory Visit | Attending: Orthopedic Surgery

## 2017-03-04 DIAGNOSIS — E1121 Type 2 diabetes mellitus with diabetic nephropathy: Secondary | ICD-10-CM | POA: Insufficient documentation

## 2017-03-04 DIAGNOSIS — I5042 Chronic combined systolic (congestive) and diastolic (congestive) heart failure: Secondary | ICD-10-CM | POA: Diagnosis not present

## 2017-03-04 DIAGNOSIS — E1151 Type 2 diabetes mellitus with diabetic peripheral angiopathy without gangrene: Secondary | ICD-10-CM | POA: Insufficient documentation

## 2017-03-04 DIAGNOSIS — I429 Cardiomyopathy, unspecified: Secondary | ICD-10-CM | POA: Diagnosis not present

## 2017-03-04 DIAGNOSIS — E7800 Pure hypercholesterolemia, unspecified: Secondary | ICD-10-CM | POA: Diagnosis not present

## 2017-03-04 DIAGNOSIS — N184 Chronic kidney disease, stage 4 (severe): Secondary | ICD-10-CM | POA: Insufficient documentation

## 2017-03-04 DIAGNOSIS — I442 Atrioventricular block, complete: Secondary | ICD-10-CM | POA: Insufficient documentation

## 2017-03-04 DIAGNOSIS — I130 Hypertensive heart and chronic kidney disease with heart failure and stage 1 through stage 4 chronic kidney disease, or unspecified chronic kidney disease: Secondary | ICD-10-CM | POA: Insufficient documentation

## 2017-03-04 DIAGNOSIS — Z880 Allergy status to penicillin: Secondary | ICD-10-CM | POA: Diagnosis not present

## 2017-03-04 DIAGNOSIS — Z89512 Acquired absence of left leg below knee: Secondary | ICD-10-CM | POA: Insufficient documentation

## 2017-03-04 DIAGNOSIS — F17210 Nicotine dependence, cigarettes, uncomplicated: Secondary | ICD-10-CM | POA: Diagnosis not present

## 2017-03-04 DIAGNOSIS — G5603 Carpal tunnel syndrome, bilateral upper limbs: Secondary | ICD-10-CM | POA: Insufficient documentation

## 2017-03-04 DIAGNOSIS — M65841 Other synovitis and tenosynovitis, right hand: Secondary | ICD-10-CM | POA: Diagnosis present

## 2017-03-04 DIAGNOSIS — E1122 Type 2 diabetes mellitus with diabetic chronic kidney disease: Secondary | ICD-10-CM | POA: Diagnosis not present

## 2017-03-04 DIAGNOSIS — Z7982 Long term (current) use of aspirin: Secondary | ICD-10-CM | POA: Insufficient documentation

## 2017-03-04 DIAGNOSIS — I2510 Atherosclerotic heart disease of native coronary artery without angina pectoris: Secondary | ICD-10-CM | POA: Insufficient documentation

## 2017-03-04 DIAGNOSIS — Z8674 Personal history of sudden cardiac arrest: Secondary | ICD-10-CM | POA: Diagnosis not present

## 2017-03-04 DIAGNOSIS — Z96649 Presence of unspecified artificial hip joint: Secondary | ICD-10-CM | POA: Diagnosis not present

## 2017-03-04 DIAGNOSIS — Z950 Presence of cardiac pacemaker: Secondary | ICD-10-CM | POA: Diagnosis not present

## 2017-03-04 HISTORY — PX: TRIGGER FINGER RELEASE: SHX641

## 2017-03-04 HISTORY — DX: Carpal tunnel syndrome, unspecified upper limb: G56.00

## 2017-03-04 HISTORY — DX: Unspecified rotator cuff tear or rupture of unspecified shoulder, not specified as traumatic: M75.100

## 2017-03-04 HISTORY — DX: Personal history of urinary calculi: Z87.442

## 2017-03-04 LAB — GLUCOSE, CAPILLARY
Glucose-Capillary: 85 mg/dL (ref 65–99)
Glucose-Capillary: 175 mg/dL — ABNORMAL HIGH (ref 65–99)
Glucose-Capillary: 69 mg/dL (ref 65–99)
Glucose-Capillary: 129 mg/dL — ABNORMAL HIGH (ref 65–99)

## 2017-03-04 LAB — BASIC METABOLIC PANEL
Anion gap: 7 (ref 5–15)
BUN: 40 mg/dL — AB (ref 6–20)
CHLORIDE: 112 mmol/L — AB (ref 101–111)
CO2: 22 mmol/L (ref 22–32)
CREATININE: 2.2 mg/dL — AB (ref 0.44–1.00)
Calcium: 9.2 mg/dL (ref 8.9–10.3)
GFR calc Af Amer: 26 mL/min — ABNORMAL LOW (ref >60)
GFR calc non Af Amer: 22 mL/min — ABNORMAL LOW (ref >60)
GLUCOSE: 83 mg/dL (ref 65–99)
Potassium: 4.8 mmol/L (ref 3.5–5.1)
Sodium: 141 mmol/L (ref 135–145)

## 2017-03-04 LAB — CBC
HCT: 36.7 % (ref 36.0–46.0)
Hemoglobin: 11.9 g/dL — ABNORMAL LOW (ref 12.0–15.0)
MCH: 29.8 pg (ref 26.0–34.0)
MCHC: 32.4 g/dL (ref 30.0–36.0)
MCV: 92 fL (ref 78.0–100.0)
Platelets: 137 10*3/uL — ABNORMAL LOW (ref 150–400)
RBC: 3.99 MIL/uL (ref 3.87–5.11)
RDW: 13.4 % (ref 11.5–15.5)
WBC: 5.3 10*3/uL (ref 4.0–10.5)

## 2017-03-04 LAB — SURGICAL PCR SCREEN
MRSA, PCR: NEGATIVE
Staphylococcus aureus: NEGATIVE

## 2017-03-04 SURGERY — RELEASE, A1 PULLEY, FOR TRIGGER FINGER
Anesthesia: General | Site: Finger | Laterality: Right

## 2017-03-04 MED ORDER — FENTANYL CITRATE (PF) 100 MCG/2ML IJ SOLN
INTRAMUSCULAR | Status: DC | PRN
  Administered 2017-03-04: 25 ug via INTRAVENOUS

## 2017-03-04 MED ORDER — SODIUM CHLORIDE 0.9 % IV SOLN
INTRAVENOUS | Status: DC
  Administered 2017-03-04: 10:00:00 via INTRAVENOUS

## 2017-03-04 MED ORDER — ONDANSETRON HCL 4 MG/2ML IJ SOLN
INTRAMUSCULAR | Status: DC | PRN
  Administered 2017-03-04: 4 mg via INTRAVENOUS

## 2017-03-04 MED ORDER — PROPOFOL 10 MG/ML IV BOLUS
INTRAVENOUS | Status: AC
  Filled 2017-03-04: qty 20

## 2017-03-04 MED ORDER — PROPOFOL 10 MG/ML IV BOLUS
INTRAVENOUS | Status: DC | PRN
  Administered 2017-03-04: 160 mg via INTRAVENOUS

## 2017-03-04 MED ORDER — LIDOCAINE 2% (20 MG/ML) 5 ML SYRINGE
INTRAMUSCULAR | Status: DC | PRN
  Administered 2017-03-04: 80 mg via INTRAVENOUS

## 2017-03-04 MED ORDER — DEXTROSE 50 % IV SOLN
25.0000 mL | Freq: Once | INTRAVENOUS | Status: AC
  Administered 2017-03-04: 25 mL via INTRAVENOUS

## 2017-03-04 MED ORDER — OXYCODONE-ACETAMINOPHEN 5-325 MG PO TABS: 1.0000 | ORAL_TABLET | Freq: Four times a day (QID) | ORAL | 0 refills | Status: DC | PRN

## 2017-03-04 MED ORDER — 0.9 % SODIUM CHLORIDE (POUR BTL) OPTIME
TOPICAL | Status: DC | PRN
  Administered 2017-03-04: 1000 mL

## 2017-03-04 MED ORDER — BUPIVACAINE-EPINEPHRINE 0.25% -1:200000 IJ SOLN
INTRAMUSCULAR | Status: DC | PRN
  Administered 2017-03-04: 6 mL

## 2017-03-04 MED ORDER — BUPIVACAINE-EPINEPHRINE (PF) 0.25% -1:200000 IJ SOLN
INTRAMUSCULAR | Status: AC
  Filled 2017-03-04: qty 30

## 2017-03-04 MED ORDER — FENTANYL CITRATE (PF) 250 MCG/5ML IJ SOLN
INTRAMUSCULAR | Status: AC
  Filled 2017-03-04: qty 5

## 2017-03-04 MED ORDER — VANCOMYCIN HCL IN DEXTROSE 1-5 GM/200ML-% IV SOLN
1000.0000 mg | INTRAVENOUS | Status: AC
  Administered 2017-03-04: 1000 mg via INTRAVENOUS
  Filled 2017-03-04: qty 200

## 2017-03-04 MED ORDER — DEXTROSE 50 % IV SOLN
INTRAVENOUS | Status: AC
  Administered 2017-03-04: 25 mL via INTRAVENOUS
  Filled 2017-03-04: qty 50

## 2017-03-04 MED ORDER — CHLORHEXIDINE GLUCONATE 4 % EX LIQD: 60.0000 mL | Freq: Once | CUTANEOUS | Status: DC

## 2017-03-04 SURGICAL SUPPLY — 51 items
BANDAGE ACE 3X5.8 VEL STRL LF (GAUZE/BANDAGES/DRESSINGS) ×2 IMPLANT
BNDG CMPR 9X4 STRL LF SNTH (GAUZE/BANDAGES/DRESSINGS) ×1
BNDG COHESIVE 1X5 TAN STRL LF (GAUZE/BANDAGES/DRESSINGS) IMPLANT
BNDG ESMARK 4X9 LF (GAUZE/BANDAGES/DRESSINGS) ×2 IMPLANT
BNDG GAUZE ELAST 4 BULKY (GAUZE/BANDAGES/DRESSINGS) IMPLANT
CORDS BIPOLAR (ELECTRODE) ×2 IMPLANT
COVER SURGICAL LIGHT HANDLE (MISCELLANEOUS) ×2 IMPLANT
CUFF TOURNIQUET SINGLE 18IN (TOURNIQUET CUFF) IMPLANT
CUFF TOURNIQUET SINGLE 24IN (TOURNIQUET CUFF) IMPLANT
DECANTER SPIKE VIAL GLASS SM (MISCELLANEOUS) ×2 IMPLANT
DRAPE OEC MINIVIEW 54X84 (DRAPES) IMPLANT
DRAPE SURG 17X23 STRL (DRAPES) ×2 IMPLANT
DURAPREP 26ML APPLICATOR (WOUND CARE) ×2 IMPLANT
GAUZE SPONGE 2X2 8PLY STRL LF (GAUZE/BANDAGES/DRESSINGS) IMPLANT
GAUZE SPONGE 4X4 12PLY STRL (GAUZE/BANDAGES/DRESSINGS) ×2 IMPLANT
GAUZE XEROFORM 1X8 LF (GAUZE/BANDAGES/DRESSINGS) ×2 IMPLANT
GLOVE BIOGEL PI IND STRL 7.0 (GLOVE) ×1 IMPLANT
GLOVE BIOGEL PI INDICATOR 7.0 (GLOVE) ×1
GLOVE SURG SYN 8.0 (GLOVE) ×2 IMPLANT
GOWN STRL REUS W/ TWL LRG LVL3 (GOWN DISPOSABLE) ×1 IMPLANT
GOWN STRL REUS W/ TWL XL LVL3 (GOWN DISPOSABLE) ×1 IMPLANT
GOWN STRL REUS W/TWL LRG LVL3 (GOWN DISPOSABLE) ×2
GOWN STRL REUS W/TWL XL LVL3 (GOWN DISPOSABLE) ×2
KIT BASIN OR (CUSTOM PROCEDURE TRAY) ×2 IMPLANT
KIT ROOM TURNOVER OR (KITS) ×2 IMPLANT
MANIFOLD NEPTUNE II (INSTRUMENTS) ×2 IMPLANT
NEEDLE HYPO 25GX1X1/2 BEV (NEEDLE) ×2 IMPLANT
NS IRRIG 1000ML POUR BTL (IV SOLUTION) ×2 IMPLANT
PACK ORTHO EXTREMITY (CUSTOM PROCEDURE TRAY) ×2 IMPLANT
PAD ARMBOARD 7.5X6 YLW CONV (MISCELLANEOUS) ×4 IMPLANT
PAD CAST 3X4 CTTN HI CHSV (CAST SUPPLIES) IMPLANT
PADDING CAST COTTON 3X4 STRL (CAST SUPPLIES)
SPECIMEN JAR SMALL (MISCELLANEOUS) ×2 IMPLANT
SPONGE GAUZE 2X2 STER 10/PKG (GAUZE/BANDAGES/DRESSINGS)
STRIP CLOSURE SKIN 1/2X4 (GAUZE/BANDAGES/DRESSINGS) IMPLANT
SUCTION FRAZIER HANDLE 10FR (MISCELLANEOUS)
SUCTION TUBE FRAZIER 10FR DISP (MISCELLANEOUS) IMPLANT
SUT ETHIBOND 3-0 V-5 (SUTURE) IMPLANT
SUT ETHILON 4 0 PS 2 18 (SUTURE) ×2 IMPLANT
SUT ETHILON 5 0 PS 2 18 (SUTURE) IMPLANT
SUT MERSILENE 4 0 P 3 (SUTURE) IMPLANT
SUT PROLENE 3 0 PS 2 (SUTURE) IMPLANT
SUT SILK 4 0 PS 2 (SUTURE) IMPLANT
SUT VIC AB 4-0 P-3 18X BRD (SUTURE) IMPLANT
SUT VIC AB 4-0 P3 18 (SUTURE)
SYR CONTROL 10ML LL (SYRINGE) ×2 IMPLANT
TOWEL OR 17X24 6PK STRL BLUE (TOWEL DISPOSABLE) IMPLANT
TOWEL OR 17X26 10 PK STRL BLUE (TOWEL DISPOSABLE) ×2 IMPLANT
TUBE CONNECTING 12X1/4 (SUCTIONS) IMPLANT
UNDERPAD 30X30 (UNDERPADS AND DIAPERS) ×2 IMPLANT
WATER STERILE IRR 1000ML POUR (IV SOLUTION) ×2 IMPLANT
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
## 2017-03-04 NOTE — Transfer of Care (Signed)
Immediate Anesthesia Transfer of Care Note  Patient: [name] [name]  Procedure(s) Performed: Procedure(s): RELEASE TRIGGER FINGER/A-1 PULLEY WITH FLEXOR SYNOVECTOMY (Right)  Patient Location: PACU  Anesthesia Type:General  Level of Consciousness: awake and alert   Airway & Oxygen Therapy: Patient Spontanous Breathing  Post-op Assessment: Report given to RN and Post -op Vital signs reviewed and stable  Post vital signs: Reviewed and stable  Last Vitals:  Vitals:   03/04/17 0908 03/04/17 1308  BP: (!) 161/70 108/66  Pulse: 60 (!) 59  Resp: 18 16  Temp: 36.6 C 36.6 C    Last Pain:  Vitals:   03/04/17 1308  TempSrc:   PainSc: (P) 0-No pain      Patients Stated Pain Goal: 2 (43/15/40 0867)  Complications: No apparent anesthesia complications

## 2017-03-04 NOTE — Anesthesia Postprocedure Evaluation (Signed)
Anesthesia Post Note  Patient: [name] [name]  Procedure(s) Performed: Procedure(s) (LRB): RELEASE TRIGGER FINGER/A-1 PULLEY WITH FLEXOR SYNOVECTOMY (Right)     Patient location during evaluation: PACU Anesthesia Type: General Level of consciousness: awake and alert Pain management: pain level controlled Vital Signs Assessment: post-procedure vital signs reviewed and stable Respiratory status: spontaneous breathing, nonlabored ventilation and respiratory function stable Cardiovascular status: blood pressure returned to baseline and stable Postop Assessment: no signs of nausea or vomiting Anesthetic complications: no    Last Vitals:  Vitals:   03/04/17 1315 03/04/17 1321  BP: 129/80 (!) 149/86  Pulse: 63 63  Resp: 12 (!) 9  Temp:  36.7 C    Last Pain:  Vitals:   03/04/17 1321  TempSrc:   PainSc: 0-No pain                 Lynda Rainwater

## 2017-03-04 NOTE — Progress Notes (Signed)
Patient awaiting transportation with [employer] via social services. Transportation called and coming to pick her up with sister at 2pm. Will discharge at this time.

## 2017-03-04 NOTE — Op Note (Signed)
Please see dictated operative report [phone]

## 2017-03-04 NOTE — Anesthesia Preprocedure Evaluation (Addendum)
Anesthesia Evaluation  Patient identified by MRN, date of birth, ID band Patient awake    Reviewed: Allergy & Precautions, NPO status , Patient's Chart, lab work & pertinent test results, reviewed documented beta blocker date and time   History of Anesthesia Complications Negative for: history of anesthetic complications  Airway Mallampati: I  TM Distance: >3 FB Neck ROM: Full    Dental  (+) Edentulous Upper, Edentulous Lower, Dental Advisory Given   Pulmonary neg pulmonary ROS, Current Smoker,    Pulmonary exam normal        Cardiovascular hypertension, Pt. on medications and Pt. on home beta blockers + CAD and + Peripheral Vascular Disease  negative cardio ROS Normal cardiovascular exam+ pacemaker   10/2014 Echo: EF 20-25%, glob HK, mild LVH, mild MR, midly dil LA, mildly dec RV fxn, mod TR,   Neuro/Psych  Headaches, negative psych ROS   GI/Hepatic negative GI ROS, Neg liver ROS,   Endo/Other  negative endocrine ROSdiabetes  Renal/GU   negative genitourinary   Musculoskeletal negative musculoskeletal ROS (+)   Abdominal   Peds negative pediatric ROS (+)  Hematology negative hematology ROS (+)   Anesthesia Other Findings   Reproductive/Obstetrics negative OB ROS                            Anesthesia Physical Anesthesia Plan  ASA: III  Anesthesia Plan: General   Post-op Pain Management:    Induction: Intravenous  PONV Risk Score and Plan:   Airway Management Planned: LMA  Additional Equipment:   Intra-op Plan:   Post-operative Plan: Extubation in OR  Informed Consent: I have reviewed the patients History and Physical, chart, labs and discussed the procedure including the risks, benefits and alternatives for the proposed anesthesia with the patient or authorized representative who has indicated his/her understanding and acceptance.   Dental advisory given  Plan Discussed  with: CRNA and Anesthesiologist  Anesthesia Plan Comments:        Anesthesia Quick Evaluation

## 2017-03-04 NOTE — Op Note (Signed)
NAME:  BLAKELY, MARANAN                     ACCOUNT NO.:  MEDICAL RECORD NO.:  [id number]  LOCATION:                                 FACILITY:  PHYSICIAN:  Sheral Apley. 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  PROCEDURE:  Right long finger A1 pulley release with limited flexor synovectomy of the FDS and FDP tendon.  SURGEON:  Sheral Apley. Burney Gauze, M.D.  ASSISTANT:  None.  ANESTHESIA:  General.  COMPLICATION:  None.  DRAINS:  None.  DESCRIPTION OF PROCEDURE:  The patient was taken to the operating suite after induction of adequate general anesthetic.  Right upper extremity was prepped and draped in sterile fashion.  An Esmarch was used to exsanguinate the limb.  Tourniquet was inflated to 250 mmHg.  At this point in time, a Brunner incision was made in the palmar aspect of the right long finger.  It was under the A1 pulley.  Skin was incised sharply.  Dissection was carried down to the flexor sheath. Neurovascular structures identified and retracted.  The A1 pulley was split.  The FDS and FDP tendons were lysed of all adhesions.  We performed a limited synovectomy of both these tendons until the tendons were showing healthy throughout the entire operative field.  The wound was then thoroughly irrigated and loosely closed with 4-0 nylon. Xeroform, 4x4s, and a compressive hand dressing was applied.  The patient tolerated the procedure well and went to recovery room in stable fashion.     Sheral Apley Burney Gauze, M.D.     MAW/MEDQ  D:  03/04/2017  T:  03/04/2017  Job:  [id number]

## 2017-03-04 NOTE — Anesthesia Procedure Notes (Signed)
Procedure Name: LMA Insertion Date/Time: 03/04/2017 12:32 PM Performed by: Manuela Schwartz B Pre-anesthesia Checklist: Patient identified, Emergency Drugs available, Suction available, Patient being monitored and Timeout performed Patient Re-evaluated:Patient Re-evaluated prior to inductionOxygen Delivery Method: Circle system utilized Preoxygenation: Pre-oxygenation with 100% oxygen Intubation Type: IV induction LMA: LMA inserted LMA Size: 4.0 Number of attempts: 1 Placement Confirmation: positive ETCO2 and breath sounds checked- equal and bilateral Tube secured with: Tape Dental Injury: Teeth and Oropharynx as per pre-operative assessment

## 2017-03-05 ENCOUNTER — Ambulatory Visit (INDEPENDENT_AMBULATORY_CARE_PROVIDER_SITE_OTHER): Payer: Medicaid Other | Admitting: Internal Medicine

## 2017-03-05 ENCOUNTER — Encounter (HOSPITAL_COMMUNITY): Payer: Self-pay | Admitting: Orthopedic Surgery

## 2017-03-05 VITALS — BP 154/76 | HR 59 | Temp 98.4°F | Wt 125.4 lb

## 2017-03-05 DIAGNOSIS — M6530 Trigger finger, unspecified finger: Secondary | ICD-10-CM

## 2017-03-05 DIAGNOSIS — Z9889 Other specified postprocedural states: Secondary | ICD-10-CM | POA: Diagnosis not present

## 2017-03-05 DIAGNOSIS — N184 Chronic kidney disease, stage 4 (severe): Secondary | ICD-10-CM

## 2017-03-05 DIAGNOSIS — I10 Essential (primary) hypertension: Secondary | ICD-10-CM

## 2017-03-05 DIAGNOSIS — I129 Hypertensive chronic kidney disease with stage 1 through stage 4 chronic kidney disease, or unspecified chronic kidney disease: Secondary | ICD-10-CM

## 2017-03-05 DIAGNOSIS — F17210 Nicotine dependence, cigarettes, uncomplicated: Secondary | ICD-10-CM

## 2017-03-05 DIAGNOSIS — E1122 Type 2 diabetes mellitus with diabetic chronic kidney disease: Secondary | ICD-10-CM | POA: Diagnosis present

## 2017-03-05 DIAGNOSIS — N183 Chronic kidney disease, stage 3 unspecified: Secondary | ICD-10-CM

## 2017-03-05 DIAGNOSIS — Z79899 Other long term (current) drug therapy: Secondary | ICD-10-CM | POA: Diagnosis not present

## 2017-03-05 LAB — GLUCOSE, CAPILLARY: GLUCOSE-CAPILLARY: 79 mg/dL (ref 65–99)

## 2017-03-05 LAB — POCT GLYCOSYLATED HEMOGLOBIN (HGB A1C): Hemoglobin A1C: 5.2

## 2017-03-05 LAB — HEMOGLOBIN A1C
Hgb A1c MFr Bld: 5.1 % (ref 4.8–5.6)
MEAN PLASMA GLUCOSE: 100 mg/dL
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
## 2017-03-05 NOTE — Patient Instructions (Addendum)
Please stop by the lab to get your A1c checked.  We will let you know if anything is changed.  Please follow up in 1-2 month to recheck your blood pressure.

## 2017-03-05 NOTE — Assessment & Plan Note (Signed)
Assessment: Follows with Dr. Lorrene Reid for management of this issue.  Last OV was April 2018 with notes indicating patient now agreeable to HD and VVS evaluation.  Plan: - continue close follow up with nephrology and VVS evaluation.

## 2017-03-05 NOTE — Assessment & Plan Note (Signed)
BP Readings from Last 3 Encounters:  03/05/17 (!) 154/76  03/04/17 (!) 149/86  01/28/17 109/67   Assessment: BP is elevated today on current regimen.  She reports ongoing pain from her hand surgery yesterday as the reason for her elevated blood pressure.  She is unsure of which medication she takes.  She has Bidil, Coreg, and Lisinopril listed on her medication list.  Plan: - will not make any changes today as she had surgery yesterday and this is our first encounter - RTC 1-2 months for repeat blood pressure check

## 2017-03-05 NOTE — Assessment & Plan Note (Signed)
Lab Results  Component Value Date   HGBA1C 5.2 03/05/2017    Assessment: A1c at goal with diet alone.  Plan: - continue diet control for DM - repeat A1c in 12 months.

## 2017-03-05 NOTE — Assessment & Plan Note (Signed)
Assessment: Surgery yesterday with Dr. Burney Gauze.  Reports having pain from the surgery.  Exam is reassuring.  Plan: - follow up with Dr. Burney Gauze - reassured that pain will improve post-operatively

## 2017-03-06 NOTE — Progress Notes (Signed)
Internal Medicine Clinic Attending  Case discussed with Dr. Wallace at the time of the visit.  We reviewed the resident's history and exam and pertinent patient test results.  I agree with the assessment, diagnosis, and plan of care documented in the resident's note.  

## 2017-03-19 ENCOUNTER — Other Ambulatory Visit: Payer: Self-pay | Admitting: Internal Medicine

## 2017-03-19 DIAGNOSIS — E1142 Type 2 diabetes mellitus with diabetic polyneuropathy: Secondary | ICD-10-CM

## 2017-03-25 ENCOUNTER — Ambulatory Visit (INDEPENDENT_AMBULATORY_CARE_PROVIDER_SITE_OTHER): Payer: Medicaid Other | Admitting: Podiatry

## 2017-03-25 ENCOUNTER — Encounter: Payer: Self-pay | Admitting: Podiatry

## 2017-03-25 DIAGNOSIS — M79676 Pain in unspecified toe(s): Secondary | ICD-10-CM | POA: Diagnosis not present

## 2017-03-25 DIAGNOSIS — E1151 Type 2 diabetes mellitus with diabetic peripheral angiopathy without gangrene: Secondary | ICD-10-CM

## 2017-03-25 DIAGNOSIS — B351 Tinea unguium: Secondary | ICD-10-CM

## 2017-03-25 DIAGNOSIS — L84 Corns and callosities: Secondary | ICD-10-CM

## 2017-03-25 NOTE — Progress Notes (Signed)
This patient presents to the office with chief complaint of a painful right foot. She states that she has had long thick nails, which are causing pain and discomfort. She also has 2 calluses noted on her right foot. Patient has a history of an amputation of her left lower leg. She presents the office today for continued evaluation and treatment of her right foot   GENERAL APPEARANCE: Alert, conversant. Appropriately groomed. No acute distress.  VASCULAR: Pedal pulses are  Not  palpable at  [facility] and PT . Capillary refill time is diminished.,  Cold feet noted.  NEUROLOGIC: sensation is normal to 5.07 monofilament at 5/5 sites bilateral.  Light touch is intact bilateral, Muscle strength normal.  MUSCULOSKELETAL: acceptable muscle strength, tone and stability bilateral.  Intrinsic muscluature intact bilateral.  Rectus appearance of foot and digits noted bilateral. BK amputation left leg.  Hammer toe 2-5 right and hallux malleus.    DERMATOLOGIC: skin color, texture, and turgor are within normal limits.  No preulcerative lesions or ulcers  are seen, no interdigital maceration noted.  No open lesions present.   No drainage noted. Callus distal right hallux.  Diffuse pre-ulcerous callus sub 5th right foot.  Nails  Thick disfigured discolored nails right foot.  Onychomycosis  B/L  Debridement of callus  Right foot.  Debride nails right.  Debride callus right.  RTC 3 months.     Gardiner Barefoot DPM

## 2017-03-27 ENCOUNTER — Ambulatory Visit: Payer: Medicaid Other | Admitting: Podiatry

## 2017-04-02 ENCOUNTER — Ambulatory Visit (INDEPENDENT_AMBULATORY_CARE_PROVIDER_SITE_OTHER): Payer: Medicaid Other | Admitting: *Deleted

## 2017-04-02 DIAGNOSIS — I442 Atrioventricular block, complete: Secondary | ICD-10-CM

## 2017-04-03 ENCOUNTER — Ambulatory Visit
Admission: RE | Admit: 2017-04-03 | Discharge: 2017-04-03 | Disposition: A | Discharge status: Home / Self Care | Payer: Medicaid Other | Source: Ambulatory Visit | Attending: Family Medicine | Admitting: Family Medicine

## 2017-04-03 DIAGNOSIS — Z1231 Encounter for screening mammogram for malignant neoplasm of breast: Secondary | ICD-10-CM

## 2017-04-03 NOTE — Progress Notes (Signed)
Remote pacemaker transmission.   

## 2017-04-07 ENCOUNTER — Other Ambulatory Visit: Payer: Self-pay | Admitting: Internal Medicine

## 2017-04-07 DIAGNOSIS — I739 Peripheral vascular disease, unspecified: Secondary | ICD-10-CM

## 2017-04-07 DIAGNOSIS — I25118 Atherosclerotic heart disease of native coronary artery with other forms of angina pectoris: Secondary | ICD-10-CM

## 2017-04-10 ENCOUNTER — Encounter: Payer: Self-pay | Admitting: Cardiology

## 2017-04-17 ENCOUNTER — Other Ambulatory Visit: Payer: Self-pay | Admitting: Internal Medicine

## 2017-04-28 LAB — CUP PACEART REMOTE DEVICE CHECK
Battery Voltage: 2.84 V
Brady Statistic AP VP Percent: 99 %
Brady Statistic AS VS Percent: 1 %
Brady Statistic RA Percent Paced: 99 %
Brady Statistic RV Percent Paced: 99 %
Implantable Lead Location: 753860
Implantable Pulse Generator Implant Date: 20110225
Lead Channel Impedance Value: 380 Ohm
Lead Channel Impedance Value: 440 Ohm
Lead Channel Pacing Threshold Amplitude: 0.5 V
Lead Channel Pacing Threshold Amplitude: 0.75 V
Lead Channel Pacing Threshold Pulse Width: 0.4 ms
Lead Channel Pacing Threshold Pulse Width: 0.4 ms
Lead Channel Setting Pacing Amplitude: 2.5 V
MDC IDC LEAD IMPLANT DT: 20110225
MDC IDC LEAD IMPLANT DT: 20110225
MDC IDC LEAD LOCATION: 753859
MDC IDC MSMT BATTERY REMAINING LONGEVITY: 41 mo
MDC IDC MSMT BATTERY REMAINING PERCENTAGE: 40 %
MDC IDC MSMT LEADCHNL RA SENSING INTR AMPL: 2.6 mV
MDC IDC MSMT LEADCHNL RV SENSING INTR AMPL: 12 mV
MDC IDC SESS DTM: 20180705110847
MDC IDC SET LEADCHNL RA PACING AMPLITUDE: 2 V
MDC IDC SET LEADCHNL RV PACING PULSEWIDTH: 0.4 ms
MDC IDC SET LEADCHNL RV SENSING SENSITIVITY: 4 mV
MDC IDC STAT BRADY AP VS PERCENT: 1 %
MDC IDC STAT BRADY AS VP PERCENT: 1 %
Pulse Gen Serial Number: 7114075

## 2017-05-04 ENCOUNTER — Other Ambulatory Visit: Payer: Self-pay | Admitting: Internal Medicine

## 2017-05-04 DIAGNOSIS — I10 Essential (primary) hypertension: Secondary | ICD-10-CM

## 2017-05-04 DIAGNOSIS — E1142 Type 2 diabetes mellitus with diabetic polyneuropathy: Secondary | ICD-10-CM

## 2017-05-06 ENCOUNTER — Other Ambulatory Visit: Payer: Self-pay | Admitting: Internal Medicine

## 2017-05-06 DIAGNOSIS — I10 Essential (primary) hypertension: Secondary | ICD-10-CM

## 2017-06-08 ENCOUNTER — Other Ambulatory Visit: Payer: Self-pay | Admitting: Internal Medicine

## 2017-06-08 ENCOUNTER — Ambulatory Visit: Payer: Medicaid Other | Admitting: Podiatry

## 2017-06-09 NOTE — Telephone Encounter (Signed)
Called to pharm 

## 2017-06-10 ENCOUNTER — Ambulatory Visit: Payer: Medicaid Other | Admitting: Podiatry

## 2017-06-29 ENCOUNTER — Ambulatory Visit (INDEPENDENT_AMBULATORY_CARE_PROVIDER_SITE_OTHER): Payer: Medicaid Other | Admitting: Podiatry

## 2017-06-29 ENCOUNTER — Encounter: Payer: Self-pay | Admitting: Podiatry

## 2017-06-29 DIAGNOSIS — M79676 Pain in unspecified toe(s): Secondary | ICD-10-CM | POA: Diagnosis not present

## 2017-06-29 DIAGNOSIS — B351 Tinea unguium: Secondary | ICD-10-CM | POA: Diagnosis not present

## 2017-06-29 DIAGNOSIS — E1151 Type 2 diabetes mellitus with diabetic peripheral angiopathy without gangrene: Secondary | ICD-10-CM

## 2017-06-29 DIAGNOSIS — I739 Peripheral vascular disease, unspecified: Secondary | ICD-10-CM

## 2017-06-29 DIAGNOSIS — L84 Corns and callosities: Secondary | ICD-10-CM | POA: Diagnosis not present

## 2017-06-29 NOTE — Patient Instructions (Signed)

## 2017-06-29 NOTE — Progress Notes (Signed)
Patient ID: [name] [name], female   DOB: [birth year], 66 y.o.   MRN: [id number]    Subjective: This patient presents for scheduled visit for debridement of pre-ulcerative plantar callus fifth right MPJ And painful toenails Patient transfers from wheelchair to treatment table.Patient states that her right foot along the plantar callus area has become more uncomfortable because she has to push her wheelchair around more than usual because her home health care provider has been reduced from 3 hours daily to one hour daily. She has difficulty wearing her prosthetic limb and is forced to use a wheelchair for mobility. Her insurance does not provide for motorized wheelchair  Objective: Orientated 3 DP and PT pulses 0/4 right Capillary reflex delayed right Hammertoe 1-5 right Sensation to 10 g monofilament wire intact 2/5 right Vibratory sensation reactive right Ankle reflexes reactive right BK amputation left No open lesions Planta keratoses right hallux Large buildup of plantar callus sub-fifth right MPJ with central bleeding area that remains closed after debridement. 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 Increased pushing on the right foot and wheelchair as cause patient some increase of discomfort in the pre-ulcerative callus on the plantar fifth right MPJ  Plan: Debridement of pre-ulcerative plantar callus sub-fifth right MPJ without any bleeding Debridement of toenails 1-5 right mechanically and electronically without any bleeding Patient encouraged use wheelchair is much as possible avoid prosthetic limb Patient will wear diabetic shoes with weightbearing with additional padding  to offload the fifth right MPJ  Reappoint 10weeks

## 2017-07-02 ENCOUNTER — Ambulatory Visit (INDEPENDENT_AMBULATORY_CARE_PROVIDER_SITE_OTHER): Payer: Medicaid Other | Admitting: *Deleted

## 2017-07-02 DIAGNOSIS — I442 Atrioventricular block, complete: Secondary | ICD-10-CM

## 2017-07-02 NOTE — Progress Notes (Signed)
Remote pacemaker transmission.   

## 2017-07-08 LAB — CUP PACEART REMOTE DEVICE CHECK
Battery Remaining Percentage: 35 %
Brady Statistic AP VP Percent: 99 %
Brady Statistic AP VS Percent: 1 %
Brady Statistic AS VS Percent: 1 %
Brady Statistic RV Percent Paced: 99 %
Implantable Lead Location: 753860
Implantable Pulse Generator Implant Date: 20110225
Lead Channel Impedance Value: 480 Ohm
Lead Channel Pacing Threshold Amplitude: 0.75 V
Lead Channel Pacing Threshold Pulse Width: 0.4 ms
Lead Channel Sensing Intrinsic Amplitude: 2.3 mV
Lead Channel Setting Pacing Amplitude: 2 V
Lead Channel Setting Pacing Amplitude: 2.5 V
Lead Channel Setting Pacing Pulse Width: 0.4 ms
Lead Channel Setting Sensing Sensitivity: 4 mV
MDC IDC LEAD IMPLANT DT: 20110225
MDC IDC LEAD IMPLANT DT: 20110225
MDC IDC LEAD LOCATION: 753859
MDC IDC MSMT BATTERY REMAINING LONGEVITY: 37 mo
MDC IDC MSMT BATTERY VOLTAGE: 2.83 V
MDC IDC MSMT LEADCHNL RA IMPEDANCE VALUE: 410 Ohm
MDC IDC MSMT LEADCHNL RA PACING THRESHOLD AMPLITUDE: 0.5 V
MDC IDC MSMT LEADCHNL RA PACING THRESHOLD PULSEWIDTH: 0.4 ms
MDC IDC MSMT LEADCHNL RV SENSING INTR AMPL: 12 mV
MDC IDC SESS DTM: 20181004093801
MDC IDC STAT BRADY AS VP PERCENT: 1 %
MDC IDC STAT BRADY RA PERCENT PACED: 99 %
Pulse Gen Model: 2210
Pulse Gen Serial Number: 7114075

## 2017-07-09 ENCOUNTER — Ambulatory Visit (INDEPENDENT_AMBULATORY_CARE_PROVIDER_SITE_OTHER): Payer: Medicaid Other | Admitting: Internal Medicine

## 2017-07-09 ENCOUNTER — Encounter: Payer: Self-pay | Admitting: Internal Medicine

## 2017-07-09 ENCOUNTER — Encounter: Payer: Self-pay | Admitting: Cardiology

## 2017-07-09 VITALS — BP 97/55 | HR 59 | Temp 98.3°F | Ht 62.0 in | Wt 123.4 lb

## 2017-07-09 DIAGNOSIS — E1122 Type 2 diabetes mellitus with diabetic chronic kidney disease: Secondary | ICD-10-CM | POA: Diagnosis present

## 2017-07-09 DIAGNOSIS — N183 Type 2 diabetes mellitus with diabetic chronic kidney disease: Secondary | ICD-10-CM

## 2017-07-09 DIAGNOSIS — Z950 Presence of cardiac pacemaker: Secondary | ICD-10-CM | POA: Diagnosis not present

## 2017-07-09 DIAGNOSIS — N184 Chronic kidney disease, stage 4 (severe): Secondary | ICD-10-CM | POA: Diagnosis not present

## 2017-07-09 DIAGNOSIS — I129 Hypertensive chronic kidney disease with stage 1 through stage 4 chronic kidney disease, or unspecified chronic kidney disease: Secondary | ICD-10-CM | POA: Diagnosis not present

## 2017-07-09 DIAGNOSIS — W19XXXD Unspecified fall, subsequent encounter: Secondary | ICD-10-CM

## 2017-07-09 LAB — GLUCOSE, CAPILLARY: GLUCOSE-CAPILLARY: 112 mg/dL — AB (ref 65–99)

## 2017-07-09 LAB — POCT GLYCOSYLATED HEMOGLOBIN (HGB A1C): HEMOGLOBIN A1C: 5

## 2017-07-09 NOTE — Progress Notes (Signed)
Internal Medicine Clinic Attending  Case discussed with Dr. Wallace at the time of the visit.  We reviewed the resident's history and exam and pertinent patient test results.  I agree with the assessment, diagnosis, and plan of care documented in the resident's note.  

## 2017-07-09 NOTE — Assessment & Plan Note (Signed)
Assessment: She follows with Dr. Lorrene Reid at [facility] and has seen Dr. Scot Dock of vascular surgery for access planning.  Plan: - Continued follow up with nephrology and VVS

## 2017-07-09 NOTE — Assessment & Plan Note (Signed)
Assessment: A1c today is 5.0 on no medications.  Health maintenance indicates she is due for foot and eye exam.  She reports seeing Dr. Katy Fitch within the past 3 months and brings in prescription eye drops from their office.  Plan: - No changes to management today. - Foot exam done - Obtain records from Dr. Katy Fitch

## 2017-07-09 NOTE — Assessment & Plan Note (Signed)
Assessment: She reports no recent falls at home.  She lives alone after a roommate moved out.  She is inquiring about getting increased hours from her in home aid to help with cleaning the 2nd bedroom previously occupied by her room mate.  Plan: - Await proper forms from patient request for more hours
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
## 2017-07-09 NOTE — Patient Instructions (Signed)
Thank you for coming to see me today. It was a pleasure. Today we talked about:   Diabetes is under control.  Keep up the good work.  We will get your records from Dr. Katy Fitch.  Continue to follow up with your kidney doctors.  Please follow-up with me in 6 months.  If you have any questions or concerns, please do not hesitate to call the office at [phone].  Take Care,   Jule Ser, DO

## 2017-08-04 ENCOUNTER — Telehealth: Payer: Self-pay | Admitting: Internal Medicine

## 2017-08-04 NOTE — Telephone Encounter (Signed)
Patient requesting a DME order for a Civil engineer, contracting.  Please advise.

## 2017-08-06 ENCOUNTER — Other Ambulatory Visit: Payer: Self-pay | Admitting: Internal Medicine

## 2017-08-06 DIAGNOSIS — Z899 Acquired absence of limb, unspecified: Secondary | ICD-10-CM

## 2017-08-06 NOTE — Telephone Encounter (Signed)
DME shower chair ordered.

## 2017-08-07 ENCOUNTER — Telehealth: Payer: Self-pay | Admitting: Internal Medicine

## 2017-08-07 NOTE — Telephone Encounter (Signed)
DME ORDER faxed twice to [facility] to insure order was rec'd.  Fax numbers used were [phone] to intake and [phone].  Confirmation was rec'd for both faxed.  Pls have the patient to allow 48 hrs for turn around time for [facility] to process her insurance.  She may contact their office directly @ [phone] if any questions.

## 2017-08-07 NOTE — Telephone Encounter (Signed)
   Reason for call:   I received a call from Ms. Dallas Breeding at 7:45 PM indicating she needs a prescription for oxycodone.   Pertinent Data:   According to patient she was recently seen in clinic on 07/09/17 and forgot to ask about her pain meds.now out of that and asking for a new prescription to be send to her pharmacy.   Assessment / Plan / Recommendations:   I explained to her that we cannot prescribe oxycodone like this,she has to come to clinic for that. She will call clinic on Monday.  As always, pt is advised that if symptoms worsen or new symptoms arise, they should go to an urgent care facility or to to ER for further evaluation.   Lorella Nimrod, MD   08/07/2017, 7:47 PM

## 2017-08-26 ENCOUNTER — Ambulatory Visit (INDEPENDENT_AMBULATORY_CARE_PROVIDER_SITE_OTHER): Payer: Medicaid Other | Admitting: Family

## 2017-08-31 ENCOUNTER — Ambulatory Visit (INDEPENDENT_AMBULATORY_CARE_PROVIDER_SITE_OTHER): Payer: Medicaid Other | Admitting: Family

## 2017-09-07 ENCOUNTER — Ambulatory Visit: Payer: Medicaid Other | Admitting: Podiatry

## 2017-09-14 ENCOUNTER — Ambulatory Visit (INDEPENDENT_AMBULATORY_CARE_PROVIDER_SITE_OTHER): Payer: Medicaid Other | Admitting: Family

## 2017-10-01 ENCOUNTER — Ambulatory Visit (INDEPENDENT_AMBULATORY_CARE_PROVIDER_SITE_OTHER): Payer: Medicaid Other | Admitting: *Deleted

## 2017-10-01 DIAGNOSIS — I442 Atrioventricular block, complete: Secondary | ICD-10-CM

## 2017-10-01 NOTE — Progress Notes (Signed)
Remote pacemaker transmission.   

## 2017-10-02 ENCOUNTER — Encounter: Payer: Self-pay | Admitting: Cardiology

## 2017-10-12 ENCOUNTER — Ambulatory Visit: Payer: Medicaid Other | Admitting: Podiatry

## 2017-10-12 ENCOUNTER — Encounter: Payer: Self-pay | Admitting: Podiatry

## 2017-10-12 DIAGNOSIS — M79676 Pain in unspecified toe(s): Secondary | ICD-10-CM | POA: Diagnosis not present

## 2017-10-12 DIAGNOSIS — B351 Tinea unguium: Secondary | ICD-10-CM

## 2017-10-12 DIAGNOSIS — L989 Disorder of the skin and subcutaneous tissue, unspecified: Secondary | ICD-10-CM

## 2017-10-12 DIAGNOSIS — E0843 Diabetes mellitus due to underlying condition with diabetic autonomic (poly)neuropathy: Secondary | ICD-10-CM

## 2017-10-12 DIAGNOSIS — E1151 Type 2 diabetes mellitus with diabetic peripheral angiopathy without gangrene: Secondary | ICD-10-CM
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
## 2017-10-16 LAB — CUP PACEART REMOTE DEVICE CHECK
Battery Remaining Longevity: 23 mo
Battery Remaining Percentage: 22 %
Brady Statistic AP VS Percent: 1 %
Brady Statistic AS VP Percent: 1 %
Brady Statistic AS VS Percent: 1 %
Brady Statistic RV Percent Paced: 99 %
Date Time Interrogation Session: 20190103101256
Implantable Lead Implant Date: 20110225
Implantable Lead Location: 753860
Lead Channel Impedance Value: 410 Ohm
Lead Channel Pacing Threshold Amplitude: 0.75 V
Lead Channel Pacing Threshold Pulse Width: 0.4 ms
Lead Channel Sensing Intrinsic Amplitude: 0.8 mV
Lead Channel Sensing Intrinsic Amplitude: 12 mV
Lead Channel Setting Pacing Amplitude: 2 V
Lead Channel Setting Pacing Pulse Width: 0.4 ms
MDC IDC LEAD IMPLANT DT: 20110225
MDC IDC LEAD LOCATION: 753859
MDC IDC MSMT BATTERY VOLTAGE: 2.78 V
MDC IDC MSMT LEADCHNL RA PACING THRESHOLD AMPLITUDE: 0.5 V
MDC IDC MSMT LEADCHNL RV IMPEDANCE VALUE: 450 Ohm
MDC IDC MSMT LEADCHNL RV PACING THRESHOLD PULSEWIDTH: 0.4 ms
MDC IDC PG IMPLANT DT: 20110225
MDC IDC PG SERIAL: 7114075
MDC IDC SET LEADCHNL RV PACING AMPLITUDE: 2.5 V
MDC IDC SET LEADCHNL RV SENSING SENSITIVITY: 4 mV
MDC IDC STAT BRADY AP VP PERCENT: 99 %
MDC IDC STAT BRADY RA PERCENT PACED: 99 %
Pulse Gen Model: 2210

## 2017-10-19 ENCOUNTER — Encounter (INDEPENDENT_AMBULATORY_CARE_PROVIDER_SITE_OTHER): Payer: Self-pay | Admitting: Family

## 2017-10-19 ENCOUNTER — Ambulatory Visit (INDEPENDENT_AMBULATORY_CARE_PROVIDER_SITE_OTHER): Payer: Medicaid Other | Admitting: Family

## 2017-10-19 VITALS — Ht 62.0 in | Wt 123.0 lb

## 2017-10-19 DIAGNOSIS — Z89512 Acquired absence of left leg below knee: Secondary | ICD-10-CM | POA: Diagnosis not present

## 2017-10-19 DIAGNOSIS — M79609 Pain in unspecified limb: Secondary | ICD-10-CM

## 2017-10-19 DIAGNOSIS — T8789 Other complications of amputation stump: Secondary | ICD-10-CM

## 2017-10-19 DIAGNOSIS — IMO0002 Reserved for concepts with insufficient information to code with codable children: Secondary | ICD-10-CM
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
## 2017-10-20 ENCOUNTER — Ambulatory Visit: Payer: Medicaid Other | Admitting: Internal Medicine

## 2017-10-20 ENCOUNTER — Other Ambulatory Visit: Payer: Self-pay

## 2017-10-20 ENCOUNTER — Encounter: Payer: Self-pay | Admitting: Internal Medicine

## 2017-10-20 VITALS — BP 154/49 | HR 61 | Temp 98.0°F | Ht 62.0 in | Wt 129.4 lb

## 2017-10-20 DIAGNOSIS — Z89512 Acquired absence of left leg below knee: Secondary | ICD-10-CM

## 2017-10-20 DIAGNOSIS — R51 Headache: Secondary | ICD-10-CM | POA: Diagnosis not present

## 2017-10-20 DIAGNOSIS — E1122 Type 2 diabetes mellitus with diabetic chronic kidney disease: Secondary | ICD-10-CM

## 2017-10-20 DIAGNOSIS — N184 Chronic kidney disease, stage 4 (severe): Secondary | ICD-10-CM

## 2017-10-20 DIAGNOSIS — Z8674 Personal history of sudden cardiac arrest: Secondary | ICD-10-CM | POA: Diagnosis not present

## 2017-10-20 DIAGNOSIS — G3184 Mild cognitive impairment, so stated: Secondary | ICD-10-CM | POA: Diagnosis not present

## 2017-10-20 DIAGNOSIS — G8929 Other chronic pain: Secondary | ICD-10-CM

## 2017-10-20 DIAGNOSIS — M542 Cervicalgia: Secondary | ICD-10-CM | POA: Diagnosis present

## 2017-10-20 DIAGNOSIS — Z950 Presence of cardiac pacemaker: Secondary | ICD-10-CM

## 2017-10-20 DIAGNOSIS — I129 Hypertensive chronic kidney disease with stage 1 through stage 4 chronic kidney disease, or unspecified chronic kidney disease: Secondary | ICD-10-CM | POA: Diagnosis not present

## 2017-10-20 DIAGNOSIS — I2510 Atherosclerotic heart disease of native coronary artery without angina pectoris: Secondary | ICD-10-CM

## 2017-10-20 DIAGNOSIS — E1151 Type 2 diabetes mellitus with diabetic peripheral angiopathy without gangrene: Secondary | ICD-10-CM

## 2017-10-20 DIAGNOSIS — G546 Phantom limb syndrome with pain: Secondary | ICD-10-CM | POA: Diagnosis not present

## 2017-10-20 MED ORDER — OXYCODONE-ACETAMINOPHEN 5-325 MG PO TABS: 1.0000 | ORAL_TABLET | Freq: Two times a day (BID) | ORAL | 0 refills | Status: AC | PRN
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
## 2017-10-20 NOTE — Assessment & Plan Note (Addendum)
Assessment Patient comes in with 3 weeks of acute on chronic sharp constant neck pain. She requests Percocet, stating that this is the only medication that is helpful. Neck x-ray in 2016 was normal. On exam, she is incredibly tender to palpation over her left trapezius and on her left neck, just posterior to her ear. In 2017, she had previously been given a 30 day prescription for narcotics from our clinic. However, at follow-up, her UDS was negative for the prescribed narcotic. She displays multiple red flag symptoms today, perseverating on a prescription for Percocet and expressing unwillingness to try alternative medications, including muscle relaxers. Patient does have a history of cognitive impairment, which I feel increases the risks of chronic narcotics in this patient. I would NOT favor starting chronic narcotics for her.  I discussed with the patient that I am uncomfortable prescribing chronic narcotics from our clinic today and that I am willing to provide a short course of muscle relaxers for acute flare of chronic pain. When asked which muscle relaxers she had tried, she responded "I've tried all of them, they don't help. The only thing that works is the Ambulance person". I discussed with her that there are multiple options and asked her if she could remember the name. She stated "I can't remember, I had some left over that I took recently but they didn't help. It's in my chart, you can look". I am unable to find a prescription for any muscle relaxers in her chart. I then advised that I can provide a short course of narcotics for acute flare, however, for her chronic pain management, she will need a referral to pain clinic. She stated "just give me the prescription then".  Plan - 3-day paper prescription of Percocet 5-325mg  BID PRN provided (6 tablets) - Referral for Pain Management Clinic placed - Return to clinic on 01/14/2018 for appointment with PCP  ADDENDUM: Patient returned to clinic after  receiving 3-day paper prescription of Percocet. She informed clinic staff that "I'm not taking this. This is not enough pills. I need more". She returned the paper prescription for Percocet and said "I will get something for my pain somewhere else."

## 2017-10-20 NOTE — Patient Instructions (Addendum)
FOLLOW-UP INSTRUCTIONS When: 01/14/2018 (appointment already scheduled) For: BP and diabetes management What to bring: medications   [name] [name],  It was a pleasure to meet you today.  For the flare-up of your chronic pain, I am prescribing a short course of Percocet. Please take 1 tablet every 12 hours as needed for the pain. For management of your chronic pain, I am placing a referral for a pain management clinic. They will be able to help with your long-term pain.  I am also placing an order for Depends diapers.  Please return to clinic on 01/14/2018 for a visit with your primary doctor, or sooner if needed.

## 2017-10-21 NOTE — Progress Notes (Signed)
Internal Medicine Clinic Attending  Case discussed with Dr. Ronalee Red at the time of the visit.  We reviewed the resident's history and exam and pertinent patient test results.  I agree with the assessment, diagnosis, and plan of care documented in the resident's note.  I discussed this case in detail with Dr. Ronalee Red.  Dr. Allayne Gitelman treatment was appropriate.  We reviewed previous notes and red flags as noted in the FYI of this patients chart.  She has been discontinued from chronic narcotics from our clinic for a while now (Since PCP was Dr. Hulen Luster) due to concerning behavior and inappropriate UDS.  She is not appropriate for chronic narcotic therapy from our clinic, but could certainly benefit from evaluation in a pain clinic.  I do not doubt that she has some pain, however, she is unwilling to try non narcotic therapy and refused Rx for a short course of percocet for acute on chronic pain.  Overall, impression and plan by Dr. Ronalee Red was appropriate.

## 2017-10-22 ENCOUNTER — Other Ambulatory Visit: Payer: Self-pay | Admitting: Internal Medicine

## 2017-11-12 ENCOUNTER — Telehealth (INDEPENDENT_AMBULATORY_CARE_PROVIDER_SITE_OTHER): Payer: Self-pay | Admitting: Family

## 2017-11-12 NOTE — Telephone Encounter (Signed)
10/19/2017 OV NOTE FAXED TO [facility] [phone]

## 2017-11-23 DIAGNOSIS — Z89512 Acquired absence of left leg below knee: Secondary | ICD-10-CM | POA: Diagnosis not present

## 2017-12-11 ENCOUNTER — Encounter: Payer: Self-pay | Admitting: Internal Medicine

## 2017-12-11 ENCOUNTER — Encounter (INDEPENDENT_AMBULATORY_CARE_PROVIDER_SITE_OTHER): Payer: Self-pay

## 2017-12-11 ENCOUNTER — Ambulatory Visit (INDEPENDENT_AMBULATORY_CARE_PROVIDER_SITE_OTHER): Payer: Medicaid Other | Admitting: Internal Medicine

## 2017-12-11 ENCOUNTER — Other Ambulatory Visit: Payer: Self-pay

## 2017-12-11 VITALS — BP 167/80 | HR 59 | Temp 98.2°F | Ht 62.0 in | Wt 126.0 lb

## 2017-12-11 DIAGNOSIS — N184 Chronic kidney disease, stage 4 (severe): Secondary | ICD-10-CM | POA: Diagnosis not present

## 2017-12-11 DIAGNOSIS — Z87828 Personal history of other (healed) physical injury and trauma: Secondary | ICD-10-CM | POA: Diagnosis not present

## 2017-12-11 DIAGNOSIS — Z993 Dependence on wheelchair: Secondary | ICD-10-CM

## 2017-12-11 DIAGNOSIS — M7502 Adhesive capsulitis of left shoulder: Secondary | ICD-10-CM | POA: Diagnosis present

## 2017-12-11 DIAGNOSIS — Z89612 Acquired absence of left leg above knee: Secondary | ICD-10-CM | POA: Diagnosis not present

## 2017-12-11 NOTE — Patient Instructions (Signed)
Thank you for coming to see me today. It was a pleasure. Today we talked about:   Shoulder Pain: - We did 2 things today: 1. I have ordered Home Health Physical Therapy to come to you to help with shoulder exercises.  2. I have referred you to orthopedic surgery for evalustion of surgical option vs injections  Please follow-up with [place] in 3-6 months or sooner if needed.   If you have any questions or concerns, please do not hesitate to call the office at [phone].  Take Care

## 2017-12-11 NOTE — Assessment & Plan Note (Signed)
Her exam appears consistent with adhesive capsulitis.  I do not think plain films are indicated at this time.  She had CXR from 2016 that showed degenerative change of left glenohumeral joint.  We discussed that physical therapy would likely be the most beneficial thing for her at this point.  Transportation is problematic for her so we discussed that home health PT could be an option to which she was agreeable.  We also discussed steroid injections and orthopedic follow up in the future.  At conclusion of the visit she asked if I would be giving her anything for pain.  Plan: - Home health PT order placed - Orthopedic surgery order placed to consider intra-articular steroid injection and discuss potential surgical options - Unfortunately her CKD precludes her from NSAIDs.  She is not a candidate for opiates from our clinic.  Please see note from Dr Ronalee Red dated 10/20/2017 as to the reasons why.  She has now been re-referred to the Pain clinic as of that visit.
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
## 2017-12-11 NOTE — Progress Notes (Signed)
Internal Medicine Clinic Attending  Case discussed with Dr. Wallace at the time of the visit.  We reviewed the resident's history and exam and pertinent patient test results.  I agree with the assessment, diagnosis, and plan of care documented in the resident's note.  

## 2017-12-23 ENCOUNTER — Encounter (HOSPITAL_COMMUNITY): Payer: Medicaid Other

## 2017-12-23 ENCOUNTER — Other Ambulatory Visit (HOSPITAL_COMMUNITY): Payer: Medicaid Other

## 2017-12-23 ENCOUNTER — Ambulatory Visit: Payer: Medicaid Other | Admitting: Family

## 2017-12-23 ENCOUNTER — Ambulatory Visit (INDEPENDENT_AMBULATORY_CARE_PROVIDER_SITE_OTHER): Payer: Medicaid Other | Admitting: Orthopedic Surgery

## 2017-12-24 ENCOUNTER — Encounter (HOSPITAL_COMMUNITY): Payer: Medicaid Other

## 2017-12-24 ENCOUNTER — Ambulatory Visit: Payer: Medicaid Other | Admitting: Family

## 2017-12-24 ENCOUNTER — Other Ambulatory Visit (HOSPITAL_COMMUNITY): Payer: Medicaid Other

## 2017-12-31 ENCOUNTER — Ambulatory Visit (INDEPENDENT_AMBULATORY_CARE_PROVIDER_SITE_OTHER): Payer: Medicaid Other | Admitting: *Deleted

## 2017-12-31 DIAGNOSIS — I442 Atrioventricular block, complete: Secondary | ICD-10-CM

## 2017-12-31 NOTE — Progress Notes (Signed)
Remote pacemaker transmission.   

## 2018-01-01 ENCOUNTER — Ambulatory Visit: Payer: Medicaid Other | Admitting: Internal Medicine

## 2018-01-01 ENCOUNTER — Encounter: Payer: Self-pay | Admitting: Internal Medicine

## 2018-01-01 VITALS — BP 118/82 | HR 63 | Ht 62.0 in | Wt 123.0 lb

## 2018-01-01 DIAGNOSIS — I442 Atrioventricular block, complete: Secondary | ICD-10-CM

## 2018-01-01 DIAGNOSIS — Z950 Presence of cardiac pacemaker: Secondary | ICD-10-CM

## 2018-01-01 DIAGNOSIS — I739 Peripheral vascular disease, unspecified: Secondary | ICD-10-CM

## 2018-01-01 LAB — CUP PACEART INCLINIC DEVICE CHECK
Battery Remaining Longevity: 18 mo
Battery Voltage: 2.75 V
Brady Statistic RA Percent Paced: 99 %
Brady Statistic RV Percent Paced: 99.99 %
Implantable Lead Implant Date: 20110225
Implantable Lead Location: 753859
Lead Channel Impedance Value: 437.5 Ohm
Lead Channel Pacing Threshold Amplitude: 0.75 V
Lead Channel Pacing Threshold Pulse Width: 0.4 ms
Lead Channel Setting Pacing Amplitude: 2 V
Lead Channel Setting Pacing Amplitude: 2.5 V
Lead Channel Setting Pacing Pulse Width: 0.4 ms
Lead Channel Setting Sensing Sensitivity: 4 mV
MDC IDC LEAD IMPLANT DT: 20110225
MDC IDC LEAD LOCATION: 753860
MDC IDC MSMT LEADCHNL RA PACING THRESHOLD PULSEWIDTH: 0.4 ms
MDC IDC MSMT LEADCHNL RV IMPEDANCE VALUE: 612.5 Ohm
MDC IDC MSMT LEADCHNL RV PACING THRESHOLD AMPLITUDE: 0.75 V
MDC IDC PG IMPLANT DT: 20110225
MDC IDC PG SERIAL: 7114075
MDC IDC SESS DTM: 20190405135624

## 2018-01-01 NOTE — Patient Instructions (Signed)
Medication Instructions:  Your physician recommends that you continue on your current medications as directed. Please refer to the Current Medication list given to you today.  Labwork: None ordered.  Testing/Procedures: None ordered.  Follow-Up: Your physician wants you to follow-up in: one year with Dr. Lovena Le.   You will receive a reminder letter in the mail two months in advance. If you don't receive a letter, please call our office to schedule the follow-up appointment.  Remote monitoring is used to monitor your Pacemaker from home. This monitoring reduces the number of office visits required to check your device to one time per year. It allows [place] to keep an eye on the functioning of your device to ensure it is working properly. You are scheduled for a device check from home on 04/02/2018. You may send your transmission at any time that day. If you have a wireless device, the transmission will be sent automatically. After your physician reviews your transmission, you will receive a postcard with your next transmission date.  Any Other Special Instructions Will Be Listed Below (If Applicable).  If you need a refill on your cardiac medications before your next appointment, please call your pharmacy.
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
## 2018-01-06 ENCOUNTER — Encounter: Payer: Self-pay | Admitting: Cardiology

## 2018-01-11 ENCOUNTER — Ambulatory Visit: Payer: Medicaid Other | Admitting: Podiatry

## 2018-01-11 DIAGNOSIS — E1151 Type 2 diabetes mellitus with diabetic peripheral angiopathy without gangrene: Secondary | ICD-10-CM

## 2018-01-11 DIAGNOSIS — M79676 Pain in unspecified toe(s): Secondary | ICD-10-CM | POA: Diagnosis not present

## 2018-01-11 DIAGNOSIS — B351 Tinea unguium: Secondary | ICD-10-CM

## 2018-01-11 DIAGNOSIS — L989 Disorder of the skin and subcutaneous tissue, unspecified: Secondary | ICD-10-CM

## 2018-01-11 DIAGNOSIS — E0843 Diabetes mellitus due to underlying condition with diabetic autonomic (poly)neuropathy: Secondary | ICD-10-CM

## 2018-01-14 ENCOUNTER — Encounter: Payer: Medicaid Other | Admitting: Internal Medicine
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
## 2018-01-20 ENCOUNTER — Telehealth: Payer: Self-pay

## 2018-01-20 ENCOUNTER — Other Ambulatory Visit: Payer: Self-pay | Admitting: Internal Medicine

## 2018-01-20 DIAGNOSIS — I10 Essential (primary) hypertension: Secondary | ICD-10-CM

## 2018-01-20 NOTE — Telephone Encounter (Signed)
[name] [name], [facility] PT calls and request VO for 1x week for 4 weeks for strengthening, safety, increase dexterity. Also requests OT to eval and treat. VO given, do you agree?

## 2018-01-20 NOTE — Telephone Encounter (Signed)
Agree. Thanks

## 2018-01-20 NOTE — Telephone Encounter (Signed)
[name] [name] with [facility] requesting VO for PT and OT evaluation. Please call back.

## 2018-01-21 LAB — CUP PACEART REMOTE DEVICE CHECK
Battery Remaining Longevity: 17 mo
Battery Remaining Percentage: 17 %
Battery Voltage: 2.75 V
Brady Statistic AP VP Percent: 99 %
Brady Statistic AP VS Percent: 1 %
Brady Statistic AS VP Percent: 1 %
Brady Statistic AS VS Percent: 1 %
Brady Statistic RA Percent Paced: 99 %
Brady Statistic RV Percent Paced: 99 %
Date Time Interrogation Session: 20190404164248
Implantable Lead Implant Date: 20110225
Implantable Lead Implant Date: 20110225
Implantable Lead Location: 753859
Implantable Lead Location: 753860
Implantable Pulse Generator Implant Date: 20110225
Lead Channel Impedance Value: 390 Ohm
Lead Channel Impedance Value: 460 Ohm
Lead Channel Pacing Threshold Amplitude: 0.5 V
Lead Channel Pacing Threshold Amplitude: 0.75 V
Lead Channel Pacing Threshold Pulse Width: 0.4 ms
Lead Channel Pacing Threshold Pulse Width: 0.4 ms
Lead Channel Sensing Intrinsic Amplitude: 1 mV
Lead Channel Sensing Intrinsic Amplitude: 12 mV
Lead Channel Setting Pacing Amplitude: 2 V
Lead Channel Setting Pacing Amplitude: 2.5 V
Lead Channel Setting Pacing Pulse Width: 0.4 ms
Lead Channel Setting Sensing Sensitivity: 4 mV
Pulse Gen Model: 2210
Pulse Gen Serial Number: 7114075

## 2018-01-22 ENCOUNTER — Telehealth: Payer: Self-pay | Admitting: Internal Medicine

## 2018-01-22 NOTE — Telephone Encounter (Signed)
Rec'd call from [facility] requesting VO for OT for 1time a week for 1 week and 2 times a week for 4 weeks.[name] [name]

## 2018-01-22 NOTE — Telephone Encounter (Signed)
Rtc, lm for rtc 

## 2018-01-29 ENCOUNTER — Ambulatory Visit (INDEPENDENT_AMBULATORY_CARE_PROVIDER_SITE_OTHER): Payer: Medicaid Other

## 2018-01-29 ENCOUNTER — Ambulatory Visit (INDEPENDENT_AMBULATORY_CARE_PROVIDER_SITE_OTHER): Payer: Medicaid Other | Admitting: Orthopedic Surgery

## 2018-01-29 ENCOUNTER — Encounter (INDEPENDENT_AMBULATORY_CARE_PROVIDER_SITE_OTHER): Payer: Self-pay | Admitting: Orthopedic Surgery

## 2018-01-29 DIAGNOSIS — M19012 Primary osteoarthritis, left shoulder: Secondary | ICD-10-CM | POA: Diagnosis not present

## 2018-01-29 DIAGNOSIS — G8929 Other chronic pain: Secondary | ICD-10-CM

## 2018-01-29 DIAGNOSIS — M25512 Pain in left shoulder: Secondary | ICD-10-CM

## 2018-01-29 NOTE — Telephone Encounter (Signed)
Called again, lm

## 2018-01-30 ENCOUNTER — Encounter (INDEPENDENT_AMBULATORY_CARE_PROVIDER_SITE_OTHER): Payer: Self-pay | Admitting: Orthopedic Surgery
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
## 2018-02-02 ENCOUNTER — Telehealth: Payer: Self-pay | Admitting: Internal Medicine

## 2018-02-02 NOTE — Telephone Encounter (Signed)
[facility] [phone], CALLED AND STATED THAT PATIENT STOPPED P/T FOR NEXT WEEK STATING SHE WAS HAVING HAND SURGERY. SHE WILL CHECK BACK WITH PATIENT END OF NEXT WEEK TO SEE IF SHE WANTS TO START P/T BACK

## 2018-02-10 ENCOUNTER — Other Ambulatory Visit: Payer: Medicaid Other

## 2018-02-15 ENCOUNTER — Telehealth (INDEPENDENT_AMBULATORY_CARE_PROVIDER_SITE_OTHER): Payer: Self-pay | Admitting: Orthopedic Surgery

## 2018-02-15 ENCOUNTER — Ambulatory Visit (INDEPENDENT_AMBULATORY_CARE_PROVIDER_SITE_OTHER): Payer: Medicaid Other | Admitting: Orthopedic Surgery

## 2018-02-15 ENCOUNTER — Encounter (INDEPENDENT_AMBULATORY_CARE_PROVIDER_SITE_OTHER): Payer: Self-pay | Admitting: Orthopedic Surgery

## 2018-02-15 DIAGNOSIS — M19012 Primary osteoarthritis, left shoulder: Secondary | ICD-10-CM | POA: Diagnosis not present
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
## 2018-02-15 NOTE — Telephone Encounter (Signed)
Pt called she currently has questions about surgery this wed. I spoke with pt on the phone and she has a lot of questions pertaining to pt care.Pt has appt today @1 :30pm. I did tell pt to consult with DR.Dean during appt to get her questions answered.

## 2018-02-16 ENCOUNTER — Ambulatory Visit (INDEPENDENT_AMBULATORY_CARE_PROVIDER_SITE_OTHER): Payer: Medicaid Other | Admitting: Family

## 2018-02-16 ENCOUNTER — Ambulatory Visit (HOSPITAL_COMMUNITY)
Admission: RE | Admit: 2018-02-16 | Discharge: 2018-02-16 | Disposition: A | Discharge status: Home / Self Care | Payer: Medicaid Other | Source: Ambulatory Visit | Attending: Family | Admitting: Family

## 2018-02-16 ENCOUNTER — Encounter: Payer: Self-pay | Admitting: Family

## 2018-02-16 ENCOUNTER — Ambulatory Visit (INDEPENDENT_AMBULATORY_CARE_PROVIDER_SITE_OTHER)
Admission: RE | Admit: 2018-02-16 | Discharge: 2018-02-16 | Disposition: A | Discharge status: Home / Self Care | Payer: Medicaid Other | Source: Ambulatory Visit | Attending: Family | Admitting: Family

## 2018-02-16 ENCOUNTER — Telehealth: Payer: Self-pay | Admitting: Internal Medicine

## 2018-02-16 VITALS — BP 162/80 | HR 60 | Temp 97.2°F | Resp 16

## 2018-02-16 DIAGNOSIS — Z96642 Presence of left artificial hip joint: Secondary | ICD-10-CM | POA: Insufficient documentation

## 2018-02-16 DIAGNOSIS — Z79899 Other long term (current) drug therapy: Secondary | ICD-10-CM | POA: Insufficient documentation

## 2018-02-16 DIAGNOSIS — R0989 Other specified symptoms and signs involving the circulatory and respiratory systems: Secondary | ICD-10-CM | POA: Diagnosis not present

## 2018-02-16 DIAGNOSIS — Z833 Family history of diabetes mellitus: Secondary | ICD-10-CM | POA: Diagnosis not present

## 2018-02-16 DIAGNOSIS — Z9842 Cataract extraction status, left eye: Secondary | ICD-10-CM | POA: Diagnosis not present

## 2018-02-16 DIAGNOSIS — Z961 Presence of intraocular lens: Secondary | ICD-10-CM | POA: Diagnosis not present

## 2018-02-16 DIAGNOSIS — I495 Sick sinus syndrome: Secondary | ICD-10-CM | POA: Diagnosis not present

## 2018-02-16 DIAGNOSIS — E785 Hyperlipidemia, unspecified: Secondary | ICD-10-CM | POA: Insufficient documentation

## 2018-02-16 DIAGNOSIS — F17200 Nicotine dependence, unspecified, uncomplicated: Secondary | ICD-10-CM | POA: Diagnosis not present

## 2018-02-16 DIAGNOSIS — Z90710 Acquired absence of both cervix and uterus: Secondary | ICD-10-CM | POA: Insufficient documentation

## 2018-02-16 DIAGNOSIS — Z950 Presence of cardiac pacemaker: Secondary | ICD-10-CM | POA: Insufficient documentation

## 2018-02-16 DIAGNOSIS — I779 Disorder of arteries and arterioles, unspecified: Secondary | ICD-10-CM | POA: Diagnosis not present

## 2018-02-16 DIAGNOSIS — Z89512 Acquired absence of left leg below knee: Secondary | ICD-10-CM

## 2018-02-16 DIAGNOSIS — I129 Hypertensive chronic kidney disease with stage 1 through stage 4 chronic kidney disease, or unspecified chronic kidney disease: Secondary | ICD-10-CM | POA: Diagnosis not present

## 2018-02-16 DIAGNOSIS — I2510 Atherosclerotic heart disease of native coronary artery without angina pectoris: Secondary | ICD-10-CM | POA: Insufficient documentation

## 2018-02-16 DIAGNOSIS — F17210 Nicotine dependence, cigarettes, uncomplicated: Secondary | ICD-10-CM | POA: Diagnosis not present

## 2018-02-16 DIAGNOSIS — Z48812 Encounter for surgical aftercare following surgery on the circulatory system: Secondary | ICD-10-CM

## 2018-02-16 DIAGNOSIS — N184 Chronic kidney disease, stage 4 (severe): Secondary | ICD-10-CM

## 2018-02-16 DIAGNOSIS — I739 Peripheral vascular disease, unspecified: Secondary | ICD-10-CM | POA: Diagnosis not present

## 2018-02-16 DIAGNOSIS — Z7982 Long term (current) use of aspirin: Secondary | ICD-10-CM | POA: Insufficient documentation

## 2018-02-16 DIAGNOSIS — Z823 Family history of stroke: Secondary | ICD-10-CM | POA: Diagnosis not present

## 2018-02-16 DIAGNOSIS — Z880 Allergy status to penicillin: Secondary | ICD-10-CM | POA: Insufficient documentation

## 2018-02-16 DIAGNOSIS — Z8249 Family history of ischemic heart disease and other diseases of the circulatory system: Secondary | ICD-10-CM | POA: Diagnosis not present

## 2018-02-16 DIAGNOSIS — Z87442 Personal history of urinary calculi: Secondary | ICD-10-CM | POA: Diagnosis not present

## 2018-02-16 DIAGNOSIS — Z95828 Presence of other vascular implants and grafts: Secondary | ICD-10-CM | POA: Insufficient documentation

## 2018-02-16 DIAGNOSIS — I70201 Unspecified atherosclerosis of native arteries of extremities, right leg: Secondary | ICD-10-CM | POA: Insufficient documentation

## 2018-02-16 DIAGNOSIS — I70301 Unspecified atherosclerosis of unspecified type of bypass graft(s) of the extremities, right leg: Secondary | ICD-10-CM | POA: Insufficient documentation

## 2018-02-16 NOTE — Telephone Encounter (Signed)
Patient physician would like to call her, patient also wants physician to pull her previous results from her visit she had today. She didn't get a good report

## 2018-02-16 NOTE — Patient Instructions (Addendum)
Steps to Quit Smoking Smoking tobacco can be bad for your health. It can also affect almost every organ in your body. Smoking puts you and people around you at risk for many serious long-lasting (chronic) diseases. Quitting smoking is hard, but it is one of the best things that you can do for your health. It is never too late to quit. What are the benefits of quitting smoking? When you quit smoking, you lower your risk for getting serious diseases and conditions. They can include:  Lung cancer or lung disease.  Heart disease.  Stroke.  Heart attack.  Not being able to have children (infertility).  Weak bones (osteoporosis) and broken bones (fractures).  If you have coughing, wheezing, and shortness of breath, those symptoms may get better when you quit. You may also get sick less often. If you are pregnant, quitting smoking can help to lower your chances of having a baby of low birth weight. What can I do to help me quit smoking? Talk with your doctor about what can help you quit smoking. Some things you can do (strategies) include:  Quitting smoking totally, instead of slowly cutting back how much you smoke over a period of time.  Going to in-person counseling. You are more likely to quit if you go to many counseling sessions.  Using resources and support systems, such as: ? Online chats with a counselor. ? Phone quitlines. ? Printed self-help materials. ? Support groups or group counseling. ? Text messaging programs. ? Mobile phone apps or applications.  Taking medicines. Some of these medicines may have nicotine in them. If you are pregnant or breastfeeding, do not take any medicines to quit smoking unless your doctor says it is okay. Talk with your doctor about counseling or other things that can help you.  Talk with your doctor about using more than one strategy at the same time, such as taking medicines while you are also going to in-person counseling. This can help make  quitting easier. What things can I do to make it easier to quit? Quitting smoking might feel very hard at first, but there is a lot that you can do to make it easier. Take these steps:  Talk to your family and friends. Ask them to support and encourage you.  Call phone quitlines, reach out to support groups, or work with a counselor.  Ask people who smoke to not smoke around you.  Avoid places that make you want (trigger) to smoke, such as: ? Bars. ? Parties. ? Smoke-break areas at work.  Spend time with people who do not smoke.  Lower the stress in your life. Stress can make you want to smoke. Try these things to help your stress: ? Getting regular exercise. ? Deep-breathing exercises. ? Yoga. ? Meditating. ? Doing a body scan. To do this, close your eyes, focus on one area of your body at a time from head to toe, and notice which parts of your body are tense. Try to relax the muscles in those areas.  Download or buy apps on your mobile phone or tablet that can help you stick to your quit plan. There are many free apps, such as QuitGuide from the CDC (Centers for Disease Control and Prevention). You can find more support from smokefree.gov and other websites.  This information is not intended to replace advice given to you by your health care provider. Make sure you discuss any questions you have with your health care provider. Document Released: 07/12/2009 Document   Revised: 05/13/2016 Document Reviewed: 01/30/2015 Elsevier Interactive Patient Education  2018 Elsevier Inc.     Peripheral Vascular Disease Peripheral vascular disease (PVD) is a disease of the blood vessels that are not part of your heart and brain. A simple term for PVD is poor circulation. In most cases, PVD narrows the blood vessels that carry blood from your heart to the rest of your body. This can result in a decreased supply of blood to your arms, legs, and internal organs, like your stomach or kidneys.  However, it most often affects a person's lower legs and feet. There are two types of PVD.  Organic PVD. This is the more common type. It is caused by damage to the structure of blood vessels.  Functional PVD. This is caused by conditions that make blood vessels contract and tighten (spasm).  Without treatment, PVD tends to get worse over time. PVD can also lead to acute ischemic limb. This is when an arm or limb suddenly has trouble getting enough blood. This is a medical emergency. Follow these instructions at home:  Take medicines only as told by your doctor.  Do not use any tobacco products, including cigarettes, chewing tobacco, or electronic cigarettes. If you need help quitting, ask your doctor.  Lose weight if you are overweight, and maintain a healthy weight as told by your doctor.  Eat a diet that is low in fat and cholesterol. If you need help, ask your doctor.  Exercise regularly. Ask your doctor for some good activities for you.  Take good care of your feet. ? Wear comfortable shoes that fit well. ? Check your feet often for any cuts or sores. Contact a doctor if:  You have cramps in your legs while walking.  You have leg pain when you are at rest.  You have coldness in a leg or foot.  Your skin changes.  You are unable to get or have an erection (erectile dysfunction).  You have cuts or sores on your feet that are not healing. Get help right away if:  Your arm or leg turns cold and blue.  Your arms or legs become red, warm, swollen, painful, or numb.  You have chest pain or trouble breathing.  You suddenly have weakness in your face, arm, or leg.  You become very confused or you cannot speak.  You suddenly have a very bad headache.  You suddenly cannot see. This information is not intended to replace advice given to you by your health care provider. Make sure you discuss any questions you have with your health care provider. Document Released:  12/10/2009 Document Revised: 02/21/2016 Document Reviewed: 02/23/2014 Elsevier Interactive Patient Education  2017 Elsevier Inc.  
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
## 2018-02-17 ENCOUNTER — Ambulatory Visit
Admission: RE | Admit: 2018-02-17 | Discharge: 2018-02-17 | Disposition: A | Discharge status: Home / Self Care | Payer: Medicaid Other | Source: Ambulatory Visit | Attending: Orthopedic Surgery | Admitting: Orthopedic Surgery

## 2018-02-17 DIAGNOSIS — G8929 Other chronic pain: Secondary | ICD-10-CM

## 2018-02-17 DIAGNOSIS — M25512 Pain in left shoulder: Principal | ICD-10-CM

## 2018-02-17 NOTE — Telephone Encounter (Signed)
I called and spoke to patient.  She was requesting that I send in a prescription for Percocet to help with her stump pain.  She was seen yesterday in vascular surgery office where notes indicate patient denied stump pain and plan was for annual follow up.  She also noted recent visit with orthopedic surgeon for her shoulder pain and possible plans for surgery in the future.  CT scan of her shoulder was completed today.  I offered her a clinic appointment for evaluation and she declined, stating I had just seen her recently for her shoulder pain.  When I pointed out that she initially asked for pain medications regarding her stump pain, she focused further on her shoulder issues.  I explained that I have never prescribed her pain medications and would need her to come in to clinic to even consider this and that I would not do so over the phone.  She stated her prior doctor (Dr Hulen Luster) used to give her Percocet and I explained this was not the same thing.  There are prior red flags noted by Dr Hulen Luster regarding patients Percocet use and a prior referral to pain management to which patient did not show up for her appointment.  Patient stated she has appointment with me on 6/27 and would keep that appointment.  I asked her to call back if she desires to be seen sooner.

## 2018-02-20 ENCOUNTER — Encounter (INDEPENDENT_AMBULATORY_CARE_PROVIDER_SITE_OTHER): Payer: Self-pay | Admitting: Orthopedic Surgery

## 2018-03-02 ENCOUNTER — Other Ambulatory Visit: Payer: Self-pay | Admitting: Internal Medicine

## 2018-03-02 DIAGNOSIS — Z1231 Encounter for screening mammogram for malignant neoplasm of breast: Secondary | ICD-10-CM

## 2018-03-05 ENCOUNTER — Other Ambulatory Visit (INDEPENDENT_AMBULATORY_CARE_PROVIDER_SITE_OTHER): Payer: Self-pay

## 2018-03-12 ENCOUNTER — Other Ambulatory Visit (INDEPENDENT_AMBULATORY_CARE_PROVIDER_SITE_OTHER): Payer: Self-pay | Admitting: Orthopedic Surgery

## 2018-03-12 DIAGNOSIS — S43025A Posterior dislocation of left humerus, initial encounter: Secondary | ICD-10-CM

## 2018-03-17 NOTE — Pre-Procedure Instructions (Signed)
[name] [name]  03/17/2018      [place], [place] - [address] [place] [address] Phone: [phone] Fax: [phone]  [facility] [place], [place] - [address] [place] [address] Phone: [phone] Fax: [phone]  [id number] - [place], [employer] [address] [place] [address] Phone: [phone] Fax: [phone]    Your procedure is scheduled on Thursday 03/25/2018.  Report to [facility] Ad[facility] at [place].M.  Call this number if you have problems the morning of surgery:  [phone]   Remember:  Do not eat or drink after midnight the night before your surgery.    Continue all medications as directed by your physician except follow these medication instructions before surgery below    Take these medicines the morning of surgery with A SIP OF WATER: Carvedilol Gabapentin (Neurontin)  7 days prior to surgery STOP taking any Aspirin containing products, Aleve, Naproxen, Ibuprofen, Motrin, Advil, Goody's, BC's, all herbal medications, fish oil, and all vitamins  Follow your doctors instructions regarding your Aspirin.  If no instructions were given by your doctor, then you will need to call the surgeon's office to get instructions.       How to Manage Your Diabetes Before and After Surgery  Why is it important to control my blood sugar before and after surgery? . Improving blood sugar levels before and after surgery helps healing and can limit problems. . A way of improving blood sugar control is eating a healthy diet by: o  Eating less sugar and carbohydrates o  Increasing activity/exercise o  Talking with your doctor about reaching your blood sugar goals . High blood sugars (greater than 180 mg/dL) can raise your risk of  infections and slow your recovery, so you will need to focus on controlling your diabetes during the weeks before surgery. . Make sure that the doctor who takes care of your diabetes knows about your planned surgery including the date and location.  How do I manage my blood sugar before surgery? . Check your blood sugar at least 4 times a day, starting 2 days before surgery, to make sure that the level is not too high or low. o Check your blood sugar the morning of your surgery when you wake up and every 2 hours until you get to the Short Stay unit. . If your blood sugar is less than 70 mg/dL, you will need to treat for low blood sugar: o Do not take insulin. o Treat a low blood sugar (less than 70 mg/dL) with  cup of clear juice (cranberry or apple), 4 glucose tablets, OR glucose gel. o Recheck blood sugar in 15 minutes after treatment (to make sure it is greater than 70 mg/dL). If your blood sugar is not greater than 70 mg/dL on recheck, call [phone] for further instructions. . Report your blood sugar to the short stay nurse when you get to Short Stay.  . If you are admitted to the hospital after surgery: o Your blood sugar will be checked by the staff and you will probably be given insulin after surgery (instead of oral diabetes medicines) to make sure you have good blood sugar levels. o The goal for blood sugar control after surgery is 80-180 mg/dL.    Do not  wear jewelry, make-up or nail polish.  Do not wear lotions, powders, or perfumes, or deodorant.  Do not shave 48 hours prior to surgery.  Men may shave face and neck.  Do not bring valuables to the hospital.  [facility] is not responsible for any belongings or valuables.  Hearing aids, eyeglasses, contacts, dentures or bridgework may not be worn into surgery.  Leave your suitcase in the car.  After surgery it may be brought to your room.  For patients admitted to the hospital, discharge time will be determined by your  treatment team.  Patients discharged the day of surgery will not be allowed to drive home.   Name and phone number of your driver:    Special instructions:   [facility]- Preparing For Surgery  Before surgery, you can play an important role. Because skin is not sterile, your skin needs to be as free of germs as possible. You can reduce the number of germs on your skin by washing with CHG (chlorahexidine gluconate) Soap before surgery.  CHG is an antiseptic cleaner which kills germs and bonds with the skin to continue killing germs even after washing.    Oral Hygiene is also important to reduce your risk of infection.  Remember - BRUSH YOUR TEETH THE MORNING OF SURGERY WITH YOUR REGULAR TOOTHPASTE  Please do not use if you have an allergy to CHG or antibacterial soaps. If your skin becomes reddened/irritated stop using the CHG.  Do not shave (including legs and underarms) for at least 48 hours prior to first CHG shower. It is OK to shave your face.  Please follow these instructions carefully.   1. Shower the NIGHT BEFORE SURGERY and the MORNING OF SURGERY with CHG.   2. If you chose to wash your hair, wash your hair first as usual with your normal shampoo.  3. After you shampoo, rinse your hair and body thoroughly to remove the shampoo.  4. Use CHG as you would any other liquid soap. You can apply CHG directly to the skin and wash gently with a scrungie or a clean washcloth.   5. Apply the CHG Soap to your body ONLY FROM THE NECK DOWN.  Do not use on open wounds or open sores. Avoid contact with your eyes, ears, mouth and genitals (private parts). Wash Face and genitals (private parts)  with your normal soap.  6. Wash thoroughly, paying special attention to the area where your surgery will be performed.  7. Thoroughly rinse your body with warm water from the neck down.  8. DO NOT shower/wash with your normal soap after using and rinsing off the CHG Soap.  9. Pat yourself dry with a  CLEAN TOWEL.  10. Wear CLEAN PAJAMAS to bed the night before surgery, wear comfortable clothes the morning of surgery  11. Place CLEAN SHEETS on your bed the night of your first shower and DO NOT SLEEP WITH PETS.    Day of Surgery:  Do not apply any deodorants/lotions.  Please wear clean clothes to the hospital/surgery center.   Remember to brush your teeth WITH YOUR REGULAR TOOTHPASTE.    Please read over the following fact sheets that you were given.

## 2018-03-18 ENCOUNTER — Other Ambulatory Visit: Payer: Self-pay

## 2018-03-18 ENCOUNTER — Encounter (HOSPITAL_COMMUNITY)
Admission: RE | Admit: 2018-03-18 | Discharge: 2018-03-18 | Disposition: A | Discharge status: Home / Self Care | Payer: Medicaid Other | Source: Ambulatory Visit | Attending: Orthopedic Surgery | Admitting: Orthopedic Surgery

## 2018-03-18 ENCOUNTER — Encounter (HOSPITAL_COMMUNITY): Payer: Self-pay

## 2018-03-18 DIAGNOSIS — Z01812 Encounter for preprocedural laboratory examination: Secondary | ICD-10-CM | POA: Diagnosis present

## 2018-03-18 LAB — CBC
HCT: 36 % (ref 36.0–46.0)
Hemoglobin: 11.3 g/dL — ABNORMAL LOW (ref 12.0–15.0)
MCH: 29.4 pg (ref 26.0–34.0)
MCHC: 31.4 g/dL (ref 30.0–36.0)
MCV: 93.8 fL (ref 78.0–100.0)
PLATELETS: 102 10*3/uL — AB (ref 150–400)
RBC: 3.84 MIL/uL — ABNORMAL LOW (ref 3.87–5.11)
RDW: 13.1 % (ref 11.5–15.5)
WBC: 6.7 10*3/uL (ref 4.0–10.5)

## 2018-03-18 LAB — URINALYSIS, ROUTINE W REFLEX MICROSCOPIC
Bilirubin Urine: NEGATIVE
GLUCOSE, UA: NEGATIVE mg/dL
Hgb urine dipstick: NEGATIVE
KETONES UR: NEGATIVE mg/dL
Leukocytes, UA: NEGATIVE
Nitrite: NEGATIVE
PH: 5 (ref 5.0–8.0)
Protein, ur: NEGATIVE mg/dL
SPECIFIC GRAVITY, URINE: 1.009 (ref 1.005–1.030)

## 2018-03-18 LAB — COMPREHENSIVE METABOLIC PANEL
ALBUMIN: 3.6 g/dL (ref 3.5–5.0)
ALT: 17 U/L (ref 14–54)
ANION GAP: 6 (ref 5–15)
AST: 21 U/L (ref 15–41)
Alkaline Phosphatase: 83 U/L (ref 38–126)
BUN: 54 mg/dL — ABNORMAL HIGH (ref 6–20)
CHLORIDE: 115 mmol/L — AB (ref 101–111)
CO2: 21 mmol/L — ABNORMAL LOW (ref 22–32)
Calcium: 9 mg/dL (ref 8.9–10.3)
Creatinine, Ser: 2.76 mg/dL — ABNORMAL HIGH (ref 0.44–1.00)
GFR calc Af Amer: 20 mL/min — ABNORMAL LOW (ref >60)
GFR calc non Af Amer: 17 mL/min — ABNORMAL LOW (ref >60)
GLUCOSE: 92 mg/dL (ref 65–99)
POTASSIUM: 3.7 mmol/L (ref 3.5–5.1)
SODIUM: 142 mmol/L (ref 135–145)
Total Bilirubin: 0.7 mg/dL (ref 0.3–1.2)
Total Protein: 6.4 g/dL — ABNORMAL LOW (ref 6.5–8.1)

## 2018-03-18 LAB — GLUCOSE, CAPILLARY: Glucose-Capillary: 133 mg/dL — ABNORMAL HIGH (ref 65–99)

## 2018-03-18 LAB — TYPE AND SCREEN
ABO/RH(D): O POS
Antibody Screen: NEGATIVE

## 2018-03-18 LAB — SURGICAL PCR SCREEN
MRSA, PCR: NEGATIVE
STAPHYLOCOCCUS AUREUS: NEGATIVE

## 2018-03-18 LAB — HEMOGLOBIN A1C
Hgb A1c MFr Bld: 5.4 % (ref 4.8–5.6)
Mean Plasma Glucose: 108.28 mg/dL

## 2018-03-18 NOTE — Progress Notes (Addendum)
PAGED [facility]. [phone]  SPOKE WITH LARA [facility] WHO WAS MADE AWARE OF LEFT SHOULDER SURGERY 03/25/18 730 AM.  LARA STATED A [facility] RE[facility] WILL BE THERE.

## 2018-03-19 LAB — URINE CULTURE: CULTURE: NO GROWTH
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
## 2018-03-24 ENCOUNTER — Telehealth (INDEPENDENT_AMBULATORY_CARE_PROVIDER_SITE_OTHER): Payer: Self-pay | Admitting: Orthopedic Surgery

## 2018-03-24 NOTE — Telephone Encounter (Signed)
Patient called left voicemail message asking how long will she be in the hospital. The number to contact patient is [phone]
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
## 2018-03-24 NOTE — Anesthesia Preprocedure Evaluation (Addendum)
Anesthesia Evaluation  Patient identified by MRN, date of birth, ID band Patient awake    Reviewed: Allergy & Precautions, H&P , NPO status , Patient's Chart, lab work & pertinent test results, reviewed documented beta blocker date and time   Airway Mallampati: II  TM Distance: >3 FB Neck ROM: Full    Dental no notable dental hx. (+) Edentulous Upper, Edentulous Lower, Dental Advisory Given   Pulmonary Current Smoker,    Pulmonary exam normal breath sounds clear to auscultation       Cardiovascular Exercise Tolerance: Good hypertension, Pt. on medications and Pt. on home beta blockers + CAD, + Peripheral Vascular Disease and +CHF  + dysrhythmias + pacemaker  Rhythm:Regular Rate:Normal     Neuro/Psych  Headaches, negative psych ROS   GI/Hepatic negative GI ROS, Neg liver ROS,   Endo/Other  negative endocrine ROSdiabetes  Renal/GU Renal InsufficiencyRenal disease  negative genitourinary   Musculoskeletal  (+) Arthritis ,   Abdominal   Peds  Hematology negative hematology ROS (+) anemia ,   Anesthesia Other Findings   Reproductive/Obstetrics negative OB ROS                            Anesthesia Physical Anesthesia Plan  ASA: IV  Anesthesia Plan: General   Post-op Pain Management:  Regional for Post-op pain   Induction: Intravenous  PONV Risk Score and Plan: 2 and Ondansetron, Dexamethasone and Midazolam  Airway Management Planned: Oral ETT  Additional Equipment: Arterial line  Intra-op Plan:   Post-operative Plan: Extubation in OR  Informed Consent: I have reviewed the patients History and Physical, chart, labs and discussed the procedure including the risks, benefits and alternatives for the proposed anesthesia with the patient or authorized representative who has indicated his/her understanding and acceptance.   Dental advisory given  Plan Discussed with: CRNA  Anesthesia  Plan Comments:         Anesthesia Quick Evaluation

## 2018-03-24 NOTE — Telephone Encounter (Signed)
I tried calling back. No answer.  Per Dr Marlou Sa 1-3 days in hospital

## 2018-03-25 ENCOUNTER — Other Ambulatory Visit: Payer: Self-pay

## 2018-03-25 ENCOUNTER — Ambulatory Visit (HOSPITAL_COMMUNITY): Payer: Medicaid Other | Admitting: Vascular Surgery

## 2018-03-25 ENCOUNTER — Encounter: Payer: Self-pay | Admitting: Internal Medicine

## 2018-03-25 ENCOUNTER — Ambulatory Visit (HOSPITAL_COMMUNITY): Payer: Medicaid Other | Admitting: Anesthesiology

## 2018-03-25 ENCOUNTER — Encounter (HOSPITAL_COMMUNITY)
Admission: RE | Disposition: A | Discharge status: Home Health | Payer: Self-pay | Source: Home / Self Care | Attending: Orthopedic Surgery

## 2018-03-25 ENCOUNTER — Encounter (HOSPITAL_COMMUNITY): Payer: Self-pay | Admitting: Surgery

## 2018-03-25 ENCOUNTER — Inpatient Hospital Stay (HOSPITAL_COMMUNITY)
Admission: RE | Admit: 2018-03-25 | Discharge: 2018-04-02 | DRG: 483 | Disposition: A | Discharge status: Home Health | Payer: Medicaid Other | Attending: Orthopedic Surgery | Admitting: Orthopedic Surgery

## 2018-03-25 DIAGNOSIS — Z8674 Personal history of sudden cardiac arrest: Secondary | ICD-10-CM | POA: Diagnosis not present

## 2018-03-25 DIAGNOSIS — E1122 Type 2 diabetes mellitus with diabetic chronic kidney disease: Secondary | ICD-10-CM | POA: Diagnosis present

## 2018-03-25 DIAGNOSIS — I272 Pulmonary hypertension, unspecified: Secondary | ICD-10-CM | POA: Diagnosis present

## 2018-03-25 DIAGNOSIS — N179 Acute kidney failure, unspecified: Secondary | ICD-10-CM | POA: Diagnosis not present

## 2018-03-25 DIAGNOSIS — Z95810 Presence of automatic (implantable) cardiac defibrillator: Secondary | ICD-10-CM

## 2018-03-25 DIAGNOSIS — R4189 Other symptoms and signs involving cognitive functions and awareness: Secondary | ICD-10-CM | POA: Diagnosis present

## 2018-03-25 DIAGNOSIS — H409 Unspecified glaucoma: Secondary | ICD-10-CM | POA: Diagnosis present

## 2018-03-25 DIAGNOSIS — I9581 Postprocedural hypotension: Secondary | ICD-10-CM | POA: Diagnosis not present

## 2018-03-25 DIAGNOSIS — Z96642 Presence of left artificial hip joint: Secondary | ICD-10-CM | POA: Diagnosis present

## 2018-03-25 DIAGNOSIS — E785 Hyperlipidemia, unspecified: Secondary | ICD-10-CM | POA: Diagnosis present

## 2018-03-25 DIAGNOSIS — E1151 Type 2 diabetes mellitus with diabetic peripheral angiopathy without gangrene: Secondary | ICD-10-CM | POA: Diagnosis present

## 2018-03-25 DIAGNOSIS — X58XXXA Exposure to other specified factors, initial encounter: Secondary | ICD-10-CM | POA: Diagnosis present

## 2018-03-25 DIAGNOSIS — N170 Acute kidney failure with tubular necrosis: Secondary | ICD-10-CM | POA: Diagnosis not present

## 2018-03-25 DIAGNOSIS — Z833 Family history of diabetes mellitus: Secondary | ICD-10-CM | POA: Diagnosis not present

## 2018-03-25 DIAGNOSIS — S43005A Unspecified dislocation of left shoulder joint, initial encounter: Secondary | ICD-10-CM | POA: Diagnosis present

## 2018-03-25 DIAGNOSIS — G8929 Other chronic pain: Secondary | ICD-10-CM | POA: Diagnosis present

## 2018-03-25 DIAGNOSIS — Z87442 Personal history of urinary calculi: Secondary | ICD-10-CM

## 2018-03-25 DIAGNOSIS — Z9889 Other specified postprocedural states: Secondary | ICD-10-CM | POA: Diagnosis not present

## 2018-03-25 DIAGNOSIS — Z823 Family history of stroke: Secondary | ICD-10-CM | POA: Diagnosis not present

## 2018-03-25 DIAGNOSIS — I129 Hypertensive chronic kidney disease with stage 1 through stage 4 chronic kidney disease, or unspecified chronic kidney disease: Secondary | ICD-10-CM | POA: Diagnosis present

## 2018-03-25 DIAGNOSIS — F17210 Nicotine dependence, cigarettes, uncomplicated: Secondary | ICD-10-CM | POA: Diagnosis present

## 2018-03-25 DIAGNOSIS — E7800 Pure hypercholesterolemia, unspecified: Secondary | ICD-10-CM | POA: Diagnosis present

## 2018-03-25 DIAGNOSIS — Z950 Presence of cardiac pacemaker: Secondary | ICD-10-CM

## 2018-03-25 DIAGNOSIS — Z90710 Acquired absence of both cervix and uterus: Secondary | ICD-10-CM

## 2018-03-25 DIAGNOSIS — D631 Anemia in chronic kidney disease: Secondary | ICD-10-CM | POA: Diagnosis present

## 2018-03-25 DIAGNOSIS — I2510 Atherosclerotic heart disease of native coronary artery without angina pectoris: Secondary | ICD-10-CM | POA: Diagnosis not present

## 2018-03-25 DIAGNOSIS — I10 Essential (primary) hypertension: Secondary | ICD-10-CM | POA: Diagnosis present

## 2018-03-25 DIAGNOSIS — Z9842 Cataract extraction status, left eye: Secondary | ICD-10-CM

## 2018-03-25 DIAGNOSIS — Z8249 Family history of ischemic heart disease and other diseases of the circulatory system: Secondary | ICD-10-CM

## 2018-03-25 DIAGNOSIS — R079 Chest pain, unspecified: Secondary | ICD-10-CM

## 2018-03-25 DIAGNOSIS — Z7689 Persons encountering health services in other specified circumstances: Secondary | ICD-10-CM | POA: Diagnosis not present

## 2018-03-25 DIAGNOSIS — E875 Hyperkalemia: Secondary | ICD-10-CM | POA: Diagnosis not present

## 2018-03-25 DIAGNOSIS — Z96612 Presence of left artificial shoulder joint: Secondary | ICD-10-CM | POA: Insufficient documentation

## 2018-03-25 DIAGNOSIS — Z471 Aftercare following joint replacement surgery: Secondary | ICD-10-CM

## 2018-03-25 DIAGNOSIS — N184 Chronic kidney disease, stage 4 (severe): Secondary | ICD-10-CM | POA: Diagnosis not present

## 2018-03-25 DIAGNOSIS — R4781 Slurred speech: Secondary | ICD-10-CM | POA: Diagnosis present

## 2018-03-25 DIAGNOSIS — I428 Other cardiomyopathies: Secondary | ICD-10-CM | POA: Diagnosis present

## 2018-03-25 DIAGNOSIS — R0602 Shortness of breath: Secondary | ICD-10-CM | POA: Diagnosis not present

## 2018-03-25 DIAGNOSIS — E1140 Type 2 diabetes mellitus with diabetic neuropathy, unspecified: Secondary | ICD-10-CM | POA: Diagnosis present

## 2018-03-25 DIAGNOSIS — S43025A Posterior dislocation of left humerus, initial encounter: Secondary | ICD-10-CM | POA: Diagnosis not present

## 2018-03-25 DIAGNOSIS — F0390 Unspecified dementia without behavioral disturbance: Secondary | ICD-10-CM | POA: Diagnosis present

## 2018-03-25 DIAGNOSIS — D62 Acute posthemorrhagic anemia: Secondary | ICD-10-CM | POA: Diagnosis not present

## 2018-03-25 DIAGNOSIS — E1121 Type 2 diabetes mellitus with diabetic nephropathy: Secondary | ICD-10-CM | POA: Diagnosis present

## 2018-03-25 DIAGNOSIS — M19012 Primary osteoarthritis, left shoulder: Principal | ICD-10-CM | POA: Diagnosis present

## 2018-03-25 DIAGNOSIS — Z89512 Acquired absence of left leg below knee: Secondary | ICD-10-CM | POA: Diagnosis not present

## 2018-03-25 DIAGNOSIS — Z880 Allergy status to penicillin: Secondary | ICD-10-CM

## 2018-03-25 DIAGNOSIS — I442 Atrioventricular block, complete: Secondary | ICD-10-CM | POA: Diagnosis not present

## 2018-03-25 DIAGNOSIS — I959 Hypotension, unspecified: Secondary | ICD-10-CM | POA: Diagnosis not present

## 2018-03-25 HISTORY — PX: REVERSE SHOULDER ARTHROPLASTY: SHX5054

## 2018-03-25 LAB — GLUCOSE, CAPILLARY
Glucose-Capillary: 111 mg/dL — ABNORMAL HIGH (ref 70–99)
Glucose-Capillary: 117 mg/dL — ABNORMAL HIGH (ref 70–99)

## 2018-03-25 SURGERY — ARTHROPLASTY, SHOULDER, TOTAL, REVERSE
Anesthesia: General | Site: Shoulder | Laterality: Left

## 2018-03-25 MED ORDER — LIDOCAINE HCL (CARDIAC) PF 100 MG/5ML IV SOSY
PREFILLED_SYRINGE | INTRAVENOUS | Status: DC | PRN
  Administered 2018-03-25: 40 mg via INTRAVENOUS

## 2018-03-25 MED ORDER — FENTANYL CITRATE (PF) 250 MCG/5ML IJ SOLN
INTRAMUSCULAR | Status: AC
  Filled 2018-03-25: qty 5

## 2018-03-25 MED ORDER — MIDAZOLAM HCL 2 MG/2ML IJ SOLN
INTRAMUSCULAR | Status: AC
  Filled 2018-03-25: qty 2

## 2018-03-25 MED ORDER — DOCUSATE SODIUM 100 MG PO CAPS
100.0000 mg | ORAL_CAPSULE | Freq: Two times a day (BID) | ORAL | Status: DC
  Administered 2018-03-25 – 2018-04-02 (×12): 100 mg via ORAL
  Filled 2018-03-25 (×14): qty 1

## 2018-03-25 MED ORDER — DIPHENOXYLATE-ATROPINE 2.5-0.025 MG PO TABS
1.0000 | ORAL_TABLET | Freq: Four times a day (QID) | ORAL | Status: DC | PRN
  Administered 2018-03-26 – 2018-03-27 (×2): 1 via ORAL
  Filled 2018-03-25 (×2): qty 1

## 2018-03-25 MED ORDER — OXYCODONE HCL 5 MG PO TABS
5.0000 mg | ORAL_TABLET | ORAL | Status: DC | PRN
  Administered 2018-03-25 – 2018-04-02 (×21): 5 mg via ORAL
  Filled 2018-03-25 (×21): qty 1

## 2018-03-25 MED ORDER — ATORVASTATIN CALCIUM 40 MG PO TABS
40.0000 mg | ORAL_TABLET | Freq: Every day | ORAL | Status: DC
  Administered 2018-03-25 – 2018-04-02 (×9): 40 mg via ORAL
  Filled 2018-03-25 (×9): qty 1

## 2018-03-25 MED ORDER — PROPOFOL 10 MG/ML IV BOLUS
INTRAVENOUS | Status: DC | PRN
  Administered 2018-03-25: 70 mg via INTRAVENOUS

## 2018-03-25 MED ORDER — CHLORHEXIDINE GLUCONATE 4 % EX LIQD: 60.0000 mL | Freq: Once | CUTANEOUS | Status: DC

## 2018-03-25 MED ORDER — LACTATED RINGERS IV SOLN
INTRAVENOUS | Status: AC
  Administered 2018-03-25: 13:00:00 via INTRAVENOUS

## 2018-03-25 MED ORDER — CARVEDILOL 12.5 MG PO TABS
12.5000 mg | ORAL_TABLET | Freq: Two times a day (BID) | ORAL | Status: DC
  Administered 2018-03-25 – 2018-04-02 (×15): 12.5 mg via ORAL
  Filled 2018-03-25 (×15): qty 1

## 2018-03-25 MED ORDER — DIPHENOXYLATE-ATROPINE 2.5-0.025 MG/5ML PO LIQD: 5.0000 mL | Freq: Four times a day (QID) | ORAL | Status: DC | PRN

## 2018-03-25 MED ORDER — ONDANSETRON HCL 4 MG/2ML IJ SOLN: 4.0000 mg | Freq: Four times a day (QID) | INTRAMUSCULAR | Status: DC | PRN

## 2018-03-25 MED ORDER — VANCOMYCIN HCL IN DEXTROSE 1-5 GM/200ML-% IV SOLN
1000.0000 mg | INTRAVENOUS | Status: AC
  Administered 2018-03-25: 1000 mg via INTRAVENOUS

## 2018-03-25 MED ORDER — BUPIVACAINE LIPOSOME 1.3 % IJ SUSP
INTRAMUSCULAR | Status: DC | PRN
  Administered 2018-03-25: 10 mL via PERINEURAL

## 2018-03-25 MED ORDER — ASPIRIN EC 81 MG PO TBEC
81.0000 mg | DELAYED_RELEASE_TABLET | Freq: Two times a day (BID) | ORAL | Status: DC
  Administered 2018-03-25 – 2018-04-02 (×16): 81 mg via ORAL
  Filled 2018-03-25 (×16): qty 1

## 2018-03-25 MED ORDER — FENTANYL CITRATE (PF) 100 MCG/2ML IJ SOLN
INTRAMUSCULAR | Status: DC | PRN
  Administered 2018-03-25: 50 ug via INTRAVENOUS

## 2018-03-25 MED ORDER — METHOCARBAMOL 500 MG PO TABS
500.0000 mg | ORAL_TABLET | Freq: Four times a day (QID) | ORAL | Status: DC | PRN
  Administered 2018-03-25 – 2018-04-02 (×9): 500 mg via ORAL
  Filled 2018-03-25 (×9): qty 1

## 2018-03-25 MED ORDER — ONDANSETRON HCL 4 MG/2ML IJ SOLN
INTRAMUSCULAR | Status: AC
  Filled 2018-03-25: qty 2

## 2018-03-25 MED ORDER — ACETAMINOPHEN 325 MG PO TABS
325.0000 mg | ORAL_TABLET | Freq: Four times a day (QID) | ORAL | Status: DC | PRN
  Administered 2018-03-26: 500 mg via ORAL
  Administered 2018-03-29 – 2018-04-01 (×2): 650 mg via ORAL
  Filled 2018-03-25 (×3): qty 2

## 2018-03-25 MED ORDER — FUROSEMIDE 40 MG PO TABS
40.0000 mg | ORAL_TABLET | Freq: Every day | ORAL | Status: DC
  Administered 2018-03-25 – 2018-03-30 (×6): 40 mg via ORAL
  Filled 2018-03-25 (×6): qty 1

## 2018-03-25 MED ORDER — LATANOPROST 0.005 % OP SOLN
1.0000 [drp] | Freq: Every day | OPHTHALMIC | Status: DC
  Administered 2018-03-25 – 2018-04-01 (×7): 1 [drp] via OPHTHALMIC
  Filled 2018-03-25: qty 2.5

## 2018-03-25 MED ORDER — VANCOMYCIN HCL IN DEXTROSE 1-5 GM/200ML-% IV SOLN
1000.0000 mg | Freq: Two times a day (BID) | INTRAVENOUS | Status: AC
  Administered 2018-03-25: 1000 mg via INTRAVENOUS
  Filled 2018-03-25: qty 200

## 2018-03-25 MED ORDER — PHENOL 1.4 % MT LIQD: 1.0000 | OROMUCOSAL | Status: DC | PRN

## 2018-03-25 MED ORDER — METOCLOPRAMIDE HCL 5 MG PO TABS: 5.0000 mg | ORAL_TABLET | Freq: Three times a day (TID) | ORAL | Status: DC | PRN

## 2018-03-25 MED ORDER — ONDANSETRON HCL 4 MG/2ML IJ SOLN
INTRAMUSCULAR | Status: DC | PRN
  Administered 2018-03-25: 4 mg via INTRAVENOUS

## 2018-03-25 MED ORDER — MENTHOL 3 MG MT LOZG
1.0000 | LOZENGE | OROMUCOSAL | Status: DC | PRN
  Administered 2018-03-25 (×2): 3 mg via ORAL
  Filled 2018-03-25 (×3): qty 9

## 2018-03-25 MED ORDER — MIDAZOLAM HCL 5 MG/5ML IJ SOLN
INTRAMUSCULAR | Status: DC | PRN
  Administered 2018-03-25: 2 mg via INTRAVENOUS

## 2018-03-25 MED ORDER — VANCOMYCIN HCL 1000 MG IV SOLR
INTRAVENOUS | Status: AC
  Filled 2018-03-25: qty 1000

## 2018-03-25 MED ORDER — SUCCINYLCHOLINE CHLORIDE 20 MG/ML IJ SOLN
INTRAMUSCULAR | Status: DC | PRN
  Administered 2018-03-25: 80 mg via INTRAVENOUS

## 2018-03-25 MED ORDER — METOCLOPRAMIDE HCL 5 MG/ML IJ SOLN: 5.0000 mg | Freq: Three times a day (TID) | INTRAMUSCULAR | Status: DC | PRN

## 2018-03-25 MED ORDER — CELECOXIB 200 MG PO CAPS
200.0000 mg | ORAL_CAPSULE | Freq: Two times a day (BID) | ORAL | Status: DC
  Administered 2018-03-25 – 2018-03-30 (×10): 200 mg via ORAL
  Filled 2018-03-25 (×11): qty 1

## 2018-03-25 MED ORDER — BUPIVACAINE HCL (PF) 0.5 % IJ SOLN
INTRAMUSCULAR | Status: DC | PRN
  Administered 2018-03-25: 10 mL via PERINEURAL

## 2018-03-25 MED ORDER — LIDOCAINE 2% (20 MG/ML) 5 ML SYRINGE
INTRAMUSCULAR | Status: AC
  Filled 2018-03-25: qty 5

## 2018-03-25 MED ORDER — PROPOFOL 10 MG/ML IV BOLUS
INTRAVENOUS | Status: AC
  Filled 2018-03-25: qty 20

## 2018-03-25 MED ORDER — HYDROMORPHONE HCL 2 MG/ML IJ SOLN: 0.5000 mg | INTRAMUSCULAR | Status: DC | PRN

## 2018-03-25 MED ORDER — LISINOPRIL 10 MG PO TABS
10.0000 mg | ORAL_TABLET | Freq: Every day | ORAL | Status: DC
  Administered 2018-03-25 – 2018-03-30 (×6): 10 mg via ORAL
  Filled 2018-03-25 (×6): qty 1

## 2018-03-25 MED ORDER — METHOCARBAMOL 1000 MG/10ML IJ SOLN
500.0000 mg | Freq: Four times a day (QID) | INTRAVENOUS | Status: DC | PRN
  Filled 2018-03-25: qty 5

## 2018-03-25 MED ORDER — DEXAMETHASONE SODIUM PHOSPHATE 10 MG/ML IJ SOLN
INTRAMUSCULAR | Status: AC
  Filled 2018-03-25: qty 1

## 2018-03-25 MED ORDER — 0.9 % SODIUM CHLORIDE (POUR BTL) OPTIME
TOPICAL | Status: DC | PRN
  Administered 2018-03-25: 4000 mL

## 2018-03-25 MED ORDER — DEXAMETHASONE SODIUM PHOSPHATE 10 MG/ML IJ SOLN
INTRAMUSCULAR | Status: DC | PRN
  Administered 2018-03-25: 10 mg via INTRAVENOUS

## 2018-03-25 MED ORDER — SODIUM CHLORIDE 0.9 % IV SOLN
INTRAVENOUS | Status: DC
  Administered 2018-03-25 (×2): via INTRAVENOUS

## 2018-03-25 MED ORDER — PHENYLEPHRINE HCL 10 MG/ML IJ SOLN
INTRAVENOUS | Status: DC | PRN
  Administered 2018-03-25: 50 ug/min via INTRAVENOUS
  Administered 2018-03-25: 10:00:00 via INTRAVENOUS

## 2018-03-25 MED ORDER — GABAPENTIN 400 MG PO CAPS
400.0000 mg | ORAL_CAPSULE | Freq: Three times a day (TID) | ORAL | Status: DC
  Administered 2018-03-25 – 2018-04-02 (×24): 400 mg via ORAL
  Filled 2018-03-25 (×24): qty 1

## 2018-03-25 MED ORDER — ONDANSETRON HCL 4 MG PO TABS: 4.0000 mg | ORAL_TABLET | Freq: Four times a day (QID) | ORAL | Status: DC | PRN

## 2018-03-25 MED ORDER — VANCOMYCIN HCL 1000 MG IV SOLR
INTRAVENOUS | Status: DC | PRN
  Administered 2018-03-25: 1000 mg

## 2018-03-25 MED ORDER — HYDROMORPHONE HCL 1 MG/ML IJ SOLN: 0.2500 mg | INTRAMUSCULAR | Status: DC | PRN

## 2018-03-25 MED ORDER — SUCCINYLCHOLINE CHLORIDE 200 MG/10ML IV SOSY
PREFILLED_SYRINGE | INTRAVENOUS | Status: AC
  Filled 2018-03-25: qty 10

## 2018-03-25 SURGICAL SUPPLY — 74 items
ALCOHOL 70% 16 OZ (MISCELLANEOUS) ×2 IMPLANT
BLADE SAW SGTL 13X75X1.27 (BLADE) ×2 IMPLANT
BSPLAT GLND +2X24 MDLR (Joint) ×1 IMPLANT
CALIBRATOR GLENOID VIP 5-D (SYSTAGENIX WOUND MANAGEMENT) ×2 IMPLANT
COVER SURGICAL LIGHT HANDLE (MISCELLANEOUS) ×2 IMPLANT
CUP SUT UNIV REVERS 36 NEUTRAL (Cup) ×2 IMPLANT
DRAPE INCISE IOBAN 66X45 STRL (DRAPES) ×4 IMPLANT
DRAPE U-SHAPE 47X51 STRL (DRAPES) ×4 IMPLANT
DRSG AQUACEL AG ADV 3.5X10 (GAUZE/BANDAGES/DRESSINGS) ×2 IMPLANT
DURAPREP 26ML APPLICATOR (WOUND CARE) ×2 IMPLANT
ELECT REM PT RETURN 9FT ADLT (ELECTROSURGICAL) ×2
ELECTRODE REM PT RTRN 9FT ADLT (ELECTROSURGICAL) ×1 IMPLANT
GLENOID UNI REV MOD 24 +2 LAT (Joint) ×2 IMPLANT
GLENOSPHERE 36 +4 LAT/24 (Joint) ×2 IMPLANT
GLOVE BIOGEL PI IND STRL 6.5 (GLOVE) ×2 IMPLANT
GLOVE BIOGEL PI IND STRL 7.0 (GLOVE) ×3 IMPLANT
GLOVE BIOGEL PI IND STRL 7.5 (GLOVE) ×1 IMPLANT
GLOVE BIOGEL PI IND STRL 8 (GLOVE) ×1 IMPLANT
GLOVE BIOGEL PI INDICATOR 6.5 (GLOVE) ×2
GLOVE BIOGEL PI INDICATOR 7.0 (GLOVE) ×3
GLOVE BIOGEL PI INDICATOR 7.5 (GLOVE) ×1
GLOVE BIOGEL PI INDICATOR 8 (GLOVE) ×1
GLOVE ECLIPSE 7.0 STRL STRAW (GLOVE) ×2 IMPLANT
GLOVE SURG ORTHO 8.0 STRL STRW (GLOVE) ×2 IMPLANT
GLOVE SURG SS PI 6.5 STRL IVOR (GLOVE) ×4 IMPLANT
GLOVE SURG SS PI 7.0 STRL IVOR (GLOVE) ×2 IMPLANT
GOWN STRL REUS W/ TWL LRG LVL3 (GOWN DISPOSABLE) ×4 IMPLANT
GOWN STRL REUS W/ TWL XL LVL3 (GOWN DISPOSABLE) ×1 IMPLANT
GOWN STRL REUS W/TWL LRG LVL3 (GOWN DISPOSABLE) ×8
GOWN STRL REUS W/TWL XL LVL3 (GOWN DISPOSABLE) ×2
HYDROGEN PEROXIDE 16OZ (MISCELLANEOUS) ×2 IMPLANT
INSERT HUMERAL 36 +6 (Shoulder) ×2 IMPLANT
KIT BASIN OR (CUSTOM PROCEDURE TRAY) ×2 IMPLANT
KIT TURNOVER KIT B (KITS) ×2 IMPLANT
LOOP VESSEL MAXI BLUE (MISCELLANEOUS) ×2 IMPLANT
MANIFOLD NEPTUNE II (INSTRUMENTS) ×2 IMPLANT
NDL SUT 6 .5 CRC .975X.05 MAYO (NEEDLE) ×1 IMPLANT
NEEDLE MAYO TAPER (NEEDLE) ×2
NS IRRIG 1000ML POUR BTL (IV SOLUTION) ×2 IMPLANT
PACK SHOULDER (CUSTOM PROCEDURE TRAY) ×2 IMPLANT
PAD ARMBOARD 7.5X6 YLW CONV (MISCELLANEOUS) ×4 IMPLANT
PASSER SUT SWANSON 36MM LOOP (INSTRUMENTS) ×2 IMPLANT
PIN GUIDE 2.8MM (PIN) ×2 IMPLANT
SCREW CENTRAL MODULAR 25 (Screw) ×2 IMPLANT
SCREW PERI LOCK 5.5X16 (Screw) ×4 IMPLANT
SCREW PERI LOCK 5.5X32 (Screw) ×2 IMPLANT
SCREW PERIPHERAL 5.5X20 LOCK (Screw) ×2 IMPLANT
SCRUB BETADINE 4OZ XXX (MISCELLANEOUS) ×2 IMPLANT
SET PIN UNIVERSAL REVERSE (SET/KITS/TRAYS/PACK) ×2 IMPLANT
SLEEVE SURGEON STRL (DRAPES) ×2 IMPLANT
SLING ARM IMMOBILIZER LRG (SOFTGOODS) ×2 IMPLANT
SPONGE LAP 18X18 X RAY DECT (DISPOSABLE) ×2 IMPLANT
STEM HUMERAL UNI REVERS SZ6 (Stem) ×2 IMPLANT
STRIP CLOSURE SKIN 1/2X4 (GAUZE/BANDAGES/DRESSINGS) ×2 IMPLANT
SUCTION FRAZIER HANDLE 10FR (MISCELLANEOUS) ×1
SUCTION TUBE FRAZIER 10FR DISP (MISCELLANEOUS) ×1 IMPLANT
SUT ETHILON 3 0 PS 1 (SUTURE) ×2 IMPLANT
SUT FIBERWIRE #2 38 T-5 BLUE (SUTURE) ×6
SUT MAXBRAID (SUTURE) IMPLANT
SUT MNCRL AB 3-0 PS2 18 (SUTURE) ×2 IMPLANT
SUT SILK 2 0 TIES 10X30 (SUTURE) ×2 IMPLANT
SUT VIC AB 0 CT1 27 (SUTURE) ×8
SUT VIC AB 0 CT1 27XBRD ANBCTR (SUTURE) ×4 IMPLANT
SUT VIC AB 1 CT1 27 (SUTURE) ×12
SUT VIC AB 1 CT1 27XBRD ANBCTR (SUTURE) ×6 IMPLANT
SUT VIC AB 2-0 CT1 27 (SUTURE) ×6
SUT VIC AB 2-0 CT1 TAPERPNT 27 (SUTURE) ×3 IMPLANT
SUT VICRYL 0 AB UR-6 (SUTURE) ×6 IMPLANT
SUT VICRYL 0 UR6 27IN ABS (SUTURE) ×6 IMPLANT
SUTURE FIBERWR #2 38 T-5 BLUE (SUTURE) ×3 IMPLANT
TOWEL OR 17X26 10 PK STRL BLUE (TOWEL DISPOSABLE) ×2 IMPLANT
TRAY FOLEY BAG SILVER LF 16FR (CATHETERS) IMPLANT
WATER STERILE IRR 1000ML POUR (IV SOLUTION) ×2 IMPLANT
YANKAUER SUCT BULB TIP NO VENT (SUCTIONS) ×2 IMPLANT

## 2018-03-25 NOTE — Interval H&P Note (Signed)
History and Physical Interval Note:  03/25/2018 7:13 AM  [name] [name]  has presented today for surgery, with the diagnosis of [name]  The various methods of treatment have been discussed with the patient and family. After consideration of risks, benefits and other options for treatment, the patient has consented to  Procedure(s): LEFT REVERSE SHOULDER ARTHROPLASTY (Left) as a surgical intervention .  The patient's history has been reviewed, patient examined, no change in status, stable for surgery.  I have reviewed the patient's chart and labs.  Questions were answered to the patient's satisfaction.     Anderson Malta

## 2018-03-25 NOTE — Anesthesia Postprocedure Evaluation (Signed)
Anesthesia Post Note  Patient: [name] [name]  Procedure(s) Performed: LEFT REVERSE SHOULDER ARTHROPLASTY (Left Shoulder)     Patient location during evaluation: PACU Anesthesia Type: General and Regional Level of consciousness: awake and alert Pain management: pain level controlled Vital Signs Assessment: post-procedure vital signs reviewed and stable Respiratory status: spontaneous breathing, nonlabored ventilation and respiratory function stable Cardiovascular status: blood pressure returned to baseline and stable Postop Assessment: no apparent nausea or vomiting Anesthetic complications: no    Last Vitals:  Vitals:   03/25/18 1140 03/25/18 1220  BP: (!) 93/57 (!) 112/55  Pulse: 90 63  Resp: 14 18  Temp:  (!) 36.4 C  SpO2: 100% 100%    Last Pain:  Vitals:   03/25/18 1220  TempSrc: Oral  PainSc:                  Celesta Funderburk,W. EDMOND

## 2018-03-25 NOTE — Op Note (Signed)
NAMEHANAAN, GANCARZ MEDICAL RECORD [id number] ACCOUNT [id number] DATE OF BIRTH:[birth year] FACILITY: [facility] LOCATION: [facility] PHYSICIAN:Ricardo Randel Pigg, MD  OPERATIVE REPORT  DATE OF PROCEDURE:  03/25/2018  PREOPERATIVE DIAGNOSIS:  Left shoulder glenohumeral arthritis.  POSTOPERATIVE DIAGNOSIS:  Left shoulder glenohumeral arthritis.  PROCEDURE:  Left reverse shoulder replacement using Arthrex components, modular glenoid baseplate 24 mm +2 lateralized with 25 mm central screw, 4 peripheral locking screws, glenosphere 35+4 lateralized with a 35 neutral reverse suture cup and a size 6  stem with humeral insert small, size 36+6.  SURGEON:  Meredith Pel, MD  ASSISTANT:  Laure Kidney  INDICATIONS:  The patient is a 67 year old female with left shoulder pain.  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  She presents now for operative management after explanation of  risks and benefits.   PROCEDURE IN DETAIL:  The patient was brought to the operating room where general anesthetic was induced.  Preoperative antibiotics administered.  A timeout was called.  The patient was placed in the beach chair position with the head in neutral  position.  Left arm was pre-scrubbed with hydrogen peroxide, alcohol and Betadine, which was allowed to air dry, then prepped with DuraPrep solution and draped in a sterile manner.  Charlie Pitter was used to cover the entire operative field.  A deltopectoral  incision was made and it was made away from the pacemaker incision.  Skin and subcutaneous tissue were sharply divided.  The cephalic vein mobilized medially.  Deltopectoral interval was developed.  Subdeltoid adhesions were removed.  The patient had a  significant internal rotation contracture with external rotation only to about -5 degrees.  The biceps tendon was identified and the rotator interval was opened.  Circumflex vessels were ligated with  silk ligatures.  The axillary nerve was identified,  visualized and a vessel loop placed around it.  It was protected at all times during the case.  At this time, the subscap and capsule were removed at the layer off of the lesser tuberosity and inferior humeral neck.  This was taken down about 2 cm below  the inferior humeral neck down to the 7 o'clock position on the humeral head.  Following this, the subscap was tagged.  Supraspinatus had to be partially released and it was repaired at the end of the case.  The head was subluxated.  The head was then  prepped with a T-handled reamer and the head was cut in 30 degrees of retroversion.  Cap was placed.  Attention then directed towards the glenoid.  Circumferential release of the capsule was performed.  The labrum was excised.  Bankart lesion was  created.  Axillary nerve was protected at all times during this exposure.  The patient was not paralyzed during this portion of the exposure.  After the glenoid was exposed, the humeral head was retracted posteriorly.  Guide was placed.  Arthrex guide  was placed on the face of the glenoid.  A guide pin was placed and then reaming was performed.  Appropriate sized reamers were then placed.  Good bleeding bone was encountered.  At this time, thorough irrigation was performed and peripheral hand reaming  was done so that the glenosphere would lay flat.  At this time, the baseplate was placed with excellent purchase obtained.  Four peripheral locking screws were placed and the glenosphere was then tapped into position with good fixation achieved.  At this  time, attention was  directed towards the humerus.  Using a broach, the stem was placed up to size 6, which gave excellent fit.  Cheese grater reaming was then performed of the proximal humerus and a trial stem and cup were placed.  The +6 gave excellent  stability and range of motion.  Trial stem was removed.  A true stem was placed with the +6.  The patient had  very good stability with extension, adduction and forward flexion.  The patient had very good external rotation, abduction and forward flexion.   Following this, thorough irrigation was performed.  Vancomycin powder placed within the incision.  Subscap was not able to be closed.  Supraspinatus was repaired back to the prosthesis using 4-0 Vicryl sutures.  The deltopectoral interval was then  closed using interrupted inverted #1 Vicryl sutures followed by interrupted inverted 0 Vicryl suture, 2-0 Vicryl suture and a 3-0 Monocryl.  Three nylon sutures placed at the inferior aspect of the incision.  Aquacel dressing placed.  Shoulder  immobilizer placed.  The patient tolerated the procedure well without immediate complication and was transferred to the recovery room in stable condition.  GN/NUANCE  D:03/25/2018 T:03/25/2018 JOB:[id number]

## 2018-03-25 NOTE — Plan of Care (Signed)
  Problem: Education: Goal: Knowledge of General Education information will improve Outcome: Progressing   

## 2018-03-25 NOTE — Anesthesia Procedure Notes (Signed)
Procedure Name: Intubation Date/Time: 03/25/2018 7:44 AM Performed by: Lance Coon, CRNA Pre-anesthesia Checklist: Patient identified, Emergency Drugs available, Suction available, Patient being monitored and Timeout performed Patient Re-evaluated:Patient Re-evaluated prior to induction Oxygen Delivery Method: Circle system utilized Preoxygenation: Pre-oxygenation with 100% oxygen Induction Type: IV induction Ventilation: Mask ventilation without difficulty Laryngoscope Size: Miller and 3 Grade View: Grade II Tube type: Oral Tube size: 7.0 mm Number of attempts: 1 Airway Equipment and Method: Stylet Placement Confirmation: ETT inserted through vocal cords under direct vision,  positive ETCO2 and breath sounds checked- equal and bilateral Secured at: 21 cm Tube secured with: Tape Dental Injury: Teeth and Oropharynx as per pre-operative assessment

## 2018-03-25 NOTE — OR Nursing (Signed)
Pt cleansed of incontinent stool.

## 2018-03-25 NOTE — Anesthesia Procedure Notes (Signed)
Anesthesia Regional Block: Interscalene brachial plexus block   Pre-Anesthetic Checklist: ,, timeout performed, Correct Patient, Correct Site, Correct Laterality, Correct Procedure, Correct Position, site marked, Risks and benefits discussed, pre-op evaluation,  At surgeon's request and post-op pain management  Laterality: Left  Prep: Maximum Sterile Barrier Precautions used, chloraprep       Needles:  Injection technique: Single-shot  Needle Type: Echogenic Stimulator Needle     Needle Length: 5cm  Needle Gauge: 22     Additional Needles:   Procedures:,,,, ultrasound used (permanent image in chart),,,,  Narrative:  Start time: 03/25/2018 6:54 AM End time: 03/25/2018 7:04 AM Injection made incrementally with aspirations every 5 mL. Anesthesiologist: Roderic Palau, MD  Additional Notes: 2% Lidocaine skin wheel.

## 2018-03-25 NOTE — OR Nursing (Signed)
[employer] rep at bedside to interrogate pt's pacemaker.  Pt states has no memory of events of the morning.  Bringing family to bedside to help assess pt normal LOC.

## 2018-03-25 NOTE — Brief Op Note (Signed)
03/25/2018  11:00 AM  PATIENT:  [name] [name]  67 y.o. female  PRE-OPERATIVE DIAGNOSIS:  LEFT SHOULDER LOCKED POSTERIOR DISLOCATION  POST-OPERATIVE DIAGNOSIS:  LEFT SHOULDER LOCKED POSTERIOR DISLOCATION  PROCEDURE:  Procedure(s): LEFT REVERSE SHOULDER ARTHROPLASTY  SURGEON:  Surgeon(s): Marlou Sa, Tonna Corner, MD  ASSISTANT: Laure Kidney rnfa  ANESTHESIA:   general  EBL: 75 ml    Total I/O In: 500 [I.V.:500] Out: 37 [Blood:50]  BLOOD ADMINISTERED: none  DRAINS: none   LOCAL MEDICATIONS USED:  none  SPECIMEN:  No Specimen  COUNTS:  YES  TOURNIQUET:  * No tourniquets in log *  DICTATION: .Other Dictation: Dictation Number [id number]  PLAN OF CARE: Admit to inpatient   PATIENT DISPOSITION:  PACU - hemodynamically stable

## 2018-03-25 NOTE — Transfer of Care (Signed)
Immediate Anesthesia Transfer of Care Note  Patient: [name] [name]  Procedure(s) Performed: LEFT REVERSE SHOULDER ARTHROPLASTY (Left Shoulder)  Patient Location: PACU  Anesthesia Type:GA combined with regional for post-op pain  Level of Consciousness: awake and patient cooperative  Airway & Oxygen Therapy: Patient Spontanous Breathing  Post-op Assessment: Report given to RN and Post -op Vital signs reviewed and stable  Post vital signs: Reviewed and stable  Last Vitals:  Vitals Value Taken Time  BP 94/50 03/25/2018 11:08 AM  Temp    Pulse 91 03/25/2018 11:09 AM  Resp 20 03/25/2018 11:09 AM  SpO2 96 % 03/25/2018 11:09 AM  Vitals shown include unvalidated device data.  Last Pain:  Vitals:   03/25/18 0554  PainSc: 0-No pain      Patients Stated Pain Goal: 3 (81/85/90 9311)  Complications: No apparent anesthesia complications

## 2018-03-26 ENCOUNTER — Encounter (HOSPITAL_COMMUNITY): Payer: Self-pay | Admitting: Orthopedic Surgery

## 2018-03-26 NOTE — Evaluation (Signed)
Occupational Therapy Evaluation Patient Details Name: [name] [name] MRN: [id number] DOB: 1951/06/06 Today's Date: 03/26/2018    History of Present Illness Pt is a 67 y/o female s/p L reverse shoulder replacement. PMH including but not limited to L transtibial amputation in 2014, CAD, CKD, DM, HTN, PVD and pacemaker placement in 2011.   Clinical Impression   Pt is assisted for ADL and IADL by a aide who comes daily for 3 hours. She is primarily w/c bound and wear her prosthesis only to church. Pt and sister educated in L UE exercises, sling use and wearing schedule, positioning L UE in bed and chair and compensatory strategies for ADL adhering to shoulder precautions. Reinforced with written handouts. Pt with nerve block still intact. Will follow acutely.    Follow Up Recommendations  Follow surgeon's recommendation for DC plan and follow-up therapies    Equipment Recommendations  None recommended by OT    Recommendations for Other Services       Precautions / Restrictions Precautions Precautions: Shoulder Type of Shoulder Precautions: PROM FF to 90, abd to 60, ER to 30 and AROM elbow to hand Shoulder Interventions: Shoulder sling/immobilizer;Off for dressing/bathing/exercises Precaution Booklet Issued: Yes (comment)(shoulder handout) Precaution Comments: previous L BKA Required Braces or Orthoses: Sling Restrictions Weight Bearing Restrictions: Yes LUE Weight Bearing: Non weight bearing      Mobility Bed Mobility Overal bed mobility: Needs Assistance Bed Mobility: Supine to Sit;Sit to Supine     Supine to sit: Supervision Sit to supine: Supervision   General bed mobility comments: increased time, use of bed rails, supervision for safety  Transfers Overall transfer level: Needs assistance Equipment used: None Transfers: Stand Pivot Transfers   Stand pivot transfers: Supervision       General transfer comment: pt performed stand-pivot transfer bed<>w/c x2 with  supervision for safety; pt steady with transfer and very cautious/safe throughout    Balance Overall balance assessment: Needs assistance Sitting-balance support: No upper extremity supported Sitting balance-Leahy Scale: Good     Standing balance support: During functional activity;Single extremity supported Standing balance-Leahy Scale: Poor                             ADL either performed or assessed with clinical judgement   ADL Overall ADL's : Needs assistance/impaired Eating/Feeding: Minimal assistance;Sitting Eating/Feeding Details (indicate cue type and reason): assist to open packages Grooming: Sitting;Brushing hair;Maximal assistance   Upper Body Bathing: Moderate assistance;Sitting   Lower Body Bathing: Moderate assistance;Sitting/lateral leans   Upper Body Dressing : Moderate assistance;Sitting   Lower Body Dressing: Sitting/lateral leans;Maximal assistance   Toilet Transfer: Supervision/safety;Squat-pivot;BSC   Toileting- Clothing Manipulation and Hygiene: Minimal assistance;Sitting/lateral lean       Functional mobility during ADLs: Wheelchair;Modified independent General ADL Comments: Educated in sling use and wearing schedule, compensatory strategies for ADL, positioning L UE in bed and chair, NWB precaution.     Vision Patient Visual Report: No change from baseline       Perception     Praxis      Pertinent Vitals/Pain Pain Assessment: Faces Faces Pain Scale: No hurt      Hand Dominance Right   Extremity/Trunk Assessment Upper Extremity Assessment Upper Extremity Assessment: LUE deficits/detail LUE Deficits / Details: performed PROM in supine elbow>hand, FF to 90, abd to 60, ER to 30 in supine, pt with nerve block still intact   Lower Extremity Assessment Lower Extremity Assessment: Overall WFL for tasks assessed;LLE deficits/detail LLE  Deficits / Details: previous transtibial amputation (2014); has a prosthesis that she only  wears to church   Cervical / Trunk Assessment Cervical / Trunk Assessment: Normal   Communication Communication Communication: No difficulties   Cognition Arousal/Alertness: Awake/alert Behavior During Therapy: WFL for tasks assessed/performed Overall Cognitive Status: Within Functional Limits for tasks assessed                                     General Comments       Exercises     Shoulder Instructions      Home Living Family/patient expects to be discharged to:: Private residence Living Arrangements: Alone Available Help at Discharge: Family;Personal care attendant;Available PRN/intermittently;Other (Comment)(3 hours a day) Type of Home: Apartment Home Access: Level entry     Home Layout: One level     Bathroom Shower/Tub: Occupational psychologist: Handicapped height     Home Equipment: Environmental consultant - 2 wheels;Bedside commode;Shower seat;Wheelchair - manual;Hand held shower head          Prior Functioning/Environment Level of Independence: Needs assistance  Gait / Transfers Assistance Needed: pt able to perform transfers independently, uses w/c primarily for mobility and can propel herself ADL's / Homemaking Assistance Needed: requires assistance with ADLs and IADL from aide            OT Problem List: Decreased range of motion;Impaired balance (sitting and/or standing);Decreased coordination;Impaired UE functional use      OT Treatment/Interventions:      OT Goals(Current goals can be found in the care plan section) Acute Rehab OT Goals Patient Stated Goal: return home ASAP OT Goal Formulation: With patient Time For Goal Achievement: 04/08/18 Potential to Achieve Goals: Good ADL Goals Pt/caregiver will Perform Home Exercise Program: Increased ROM;Left upper extremity;With Supervision;With written HEP provided(PROM L shoulder FF90, abd60, er 30, AROM elbow to hand) Additional ADL Goal #1: Pt will don and doff sling and verbalize  understanding of wearing schedule with caregiver assisting. Additional ADL Goal #2: Pt will perform toileting and seated grooming modified independently.  OT Frequency:     Barriers to D/C:            Co-evaluation PT/OT/SLP Co-Evaluation/Treatment: Yes Reason for Co-Treatment: For patient/therapist safety PT goals addressed during session: Mobility/safety with mobility;Balance;Proper use of DME;Strengthening/ROM OT goals addressed during session: ADL's and self-care;Strengthening/ROM      AM-PAC PT "6 Clicks" Daily Activity     Outcome Measure Help from another person eating meals?: A Little Help from another person taking care of personal grooming?: A Lot Help from another person toileting, which includes using toliet, bedpan, or urinal?: A Little Help from another person bathing (including washing, rinsing, drying)?: A Lot Help from another person to put on and taking off regular upper body clothing?: A Lot Help from another person to put on and taking off regular lower body clothing?: A Lot 6 Click Score: 14   End of Session    Activity Tolerance: Patient tolerated treatment well Patient left: Other (comment);with call bell/phone within reach;with family/visitor present(in her w/c)  OT Visit Diagnosis: Unsteadiness on feet (R26.81);Muscle weakness (generalized) (M62.81)                Time: 1761-6073 OT Time Calculation (min): 40 min Charges:  OT General Charges $OT Visit: 1 Visit OT Evaluation $OT Eval Moderate Complexity: 1 Mod OT Treatments $Therapeutic Exercise: 8-22 mins G-Codes:  03/26/2018 Nestor Lewandowsky, OTR/L Pager: [phone]  Werner Lean, Haze Boyden 03/26/2018, 10:19 AM

## 2018-03-26 NOTE — Evaluation (Signed)
Physical Therapy Evaluation Patient Details Name: [name] [name] MRN: [id number] DOB: June 26, 1951 Today's Date: 03/26/2018   History of Present Illness  Pt is a 67 y/o female s/p L reverse shoulder replacement. PMH including but not limited to L transtibial amputation in 2014, CAD, CKD, DM, HTN, PVD and pacemaker placement in 2011.    Clinical Impression  Pt presented supine in bed with HOB elevated, awake and willing to participate in therapy session. Prior to admission, pt reported that she was modified independent with bed mobility and transfers. She uses a manual w/c as primary mobility and is able to propel herself. Pt requires assistance with ADLs and has an aide for a "few hours" every evening. Pt currently able to perform bed mobility with supervision, transfers with supervision and w/c mobility with supervision. Pt would continue to benefit from skilled physical therapy services at this time while admitted and after d/c to address the below listed limitations in order to improve overall safety and independence with functional mobility.     Follow Up Recommendations Home health PT;Supervision - Intermittent    Equipment Recommendations  None recommended by PT    Recommendations for Other Services       Precautions / Restrictions Precautions Precautions: Shoulder Precaution Comments: previous L transtibial amputation Restrictions Weight Bearing Restrictions: Yes LUE Weight Bearing: Non weight bearing      Mobility  Bed Mobility Overal bed mobility: Needs Assistance Bed Mobility: Supine to Sit     Supine to sit: Supervision     General bed mobility comments: increased time, use of bed rails, supervision for safety  Transfers Overall transfer level: Needs assistance Equipment used: None Transfers: Stand Pivot Transfers   Stand pivot transfers: Supervision       General transfer comment: pt performed stand-pivot transfer bed<>w/c x2 with supervision for safety; pt  steady with transfer and very cautious/safe throughout  Ambulation/Gait                Hotel manager mobility: Yes Wheelchair propulsion: Right upper extremity;Right lower extremity Wheelchair parts: Supervision/cueing Distance: 75  Modified Rankin (Stroke Patients Only)       Balance Overall balance assessment: Needs assistance Sitting-balance support: No upper extremity supported Sitting balance-Leahy Scale: Good     Standing balance support: During functional activity;Single extremity supported Standing balance-Leahy Scale: Poor                               Pertinent Vitals/Pain Pain Assessment: 0-10 Pain Score: 8  Pain Location: L shoulder Pain Descriptors / Indicators: Sore;Guarding Pain Intervention(s): Monitored during session;Repositioned;Patient requesting pain meds-RN notified    Home Living Family/patient expects to be discharged to:: Private residence Living Arrangements: Alone Available Help at Discharge: Family;Personal care attendant;Available PRN/intermittently;Other (Comment)(has an aide for a few hours every evening) Type of Home: Apartment Home Access: Level entry     Home Layout: One level Home Equipment: Walker - 2 wheels;Bedside commode;Shower seat;Wheelchair - manual      Prior Function Level of Independence: Needs assistance   Gait / Transfers Assistance Needed: pt able to perform transfers independently, uses w/c primarily for mobility and can propel herself  ADL's / Homemaking Assistance Needed: requires assistance with ADLs from aide        Hand Dominance        Extremity/Trunk Assessment   Upper Extremity Assessment Upper  Extremity Assessment: Defer to OT evaluation    Lower Extremity Assessment Lower Extremity Assessment: Overall WFL for tasks assessed;LLE deficits/detail LLE Deficits / Details: previous transtibial amputation (2014); has a  prosthesis that she only wears to church       Communication   Communication: No difficulties  Cognition Arousal/Alertness: Awake/alert Behavior During Therapy: WFL for tasks assessed/performed Overall Cognitive Status: Within Functional Limits for tasks assessed                                        General Comments      Exercises     Assessment/Plan    PT Assessment Patient needs continued PT services  PT Problem List Decreased strength;Decreased range of motion;Decreased balance;Decreased mobility;Decreased coordination;Decreased knowledge of precautions;Pain       PT Treatment Interventions DME instruction;Gait training;Stair training;Functional mobility training;Therapeutic activities;Therapeutic exercise;Balance training;Neuromuscular re-education;Patient/family education;Wheelchair mobility training    PT Goals (Current goals can be found in the Care Plan section)  Acute Rehab PT Goals Patient Stated Goal: return home ASAP PT Goal Formulation: With patient/family Time For Goal Achievement: 04/09/18 Potential to Achieve Goals: Good    Frequency Min 3X/week   Barriers to discharge        Co-evaluation PT/OT/SLP Co-Evaluation/Treatment: Yes Reason for Co-Treatment: For patient/therapist safety;To address functional/ADL transfers;Complexity of the patient's impairments (multi-system involvement) PT goals addressed during session: Mobility/safety with mobility;Balance;Proper use of DME;Strengthening/ROM         AM-PAC PT "6 Clicks" Daily Activity  Outcome Measure Difficulty turning over in bed (including adjusting bedclothes, sheets and blankets)?: None Difficulty moving from lying on back to sitting on the side of the bed? : None Difficulty sitting down on and standing up from a chair with arms (e.g., wheelchair, bedside commode, etc,.)?: Unable Help needed moving to and from a bed to chair (including a wheelchair)?: None Help needed walking  in hospital room?: Total Help needed climbing 3-5 steps with a railing? : Total 6 Click Score: 15    End of Session   Activity Tolerance: Patient tolerated treatment well Patient left: in bed;with call bell/phone within reach;with family/visitor present;Other (comment)(OT present) Nurse Communication: Mobility status PT Visit Diagnosis: Other abnormalities of gait and mobility (R26.89)    Time: 0932-6712 PT Time Calculation (min) (ACUTE ONLY): 19 min   Charges:   PT Evaluation $PT Eval Moderate Complexity: 1 Mod     PT G Codes:        [place], PT, DPT [place] 03/26/2018, 9:05 AM

## 2018-03-27 ENCOUNTER — Encounter: Payer: Self-pay | Admitting: *Deleted

## 2018-03-27 NOTE — Progress Notes (Signed)
Occupational Therapy Treatment Patient Details Name: [name] [name] MRN: [id number] DOB: Feb 16, 1951 Today's Date: 03/27/2018    History of present illness Pt is a 67 y/o female s/p L reverse shoulder replacement. PMH including but not limited to L transtibial amputation in 2014, CAD, CKD, DM, HTN, PVD and pacemaker placement in 2011.   OT comments  Pt now reporting that aide comes 2 hours a day because she is frequently late. Pt is considering SNF for 24 hour care, but is concerned she will lose her check. Pt performed shoulder self ROM and elbow to hand AROM exercises. Reinforced use of shoulder immobilizer. Nerve block no longer intact, pt experiencing more pain.  Follow Up Recommendations  Follow surgeon's recommendation for DC plan and follow-up therapies    Equipment Recommendations  None recommended by OT    Recommendations for Other Services      Precautions / Restrictions Precautions Precautions: Shoulder Type of Shoulder Precautions: PROM FF to 90, abd to 60, ER to 30 and AROM elbow to hand Shoulder Interventions: Shoulder sling/immobilizer;Off for dressing/bathing/exercises Required Braces or Orthoses: Sling Restrictions LUE Weight Bearing: Non weight bearing       Mobility Bed Mobility                  Transfers                      Balance                                           ADL either performed or assessed with clinical judgement   ADL                                         General ADL Comments: reinforced donning and doffing shoulder immobilizer and wearing schedule     Vision       Perception     Praxis      Cognition Arousal/Alertness: Awake/alert Behavior During Therapy: WFL for tasks assessed/performed Overall Cognitive Status: Within Functional Limits for tasks assessed                                          Exercises     Shoulder Instructions       General  Comments      Pertinent Vitals/ Pain       Pain Assessment: Faces Faces Pain Scale: Hurts even more Pain Location: L shoulder Pain Descriptors / Indicators: Sore;Guarding Pain Intervention(s): Monitored during session;Premedicated before session;Repositioned  Home Living                                          Prior Functioning/Environment              Frequency  Min 3X/week        Progress Toward Goals  OT Goals(current goals can now be found in the care plan section)  Progress towards OT goals: Progressing toward goals  Acute Rehab OT Goals Patient Stated Goal: return home ASAP OT Goal Formulation: With patient Time For Goal Achievement: 04/08/18  Potential to Achieve Goals: Good  Plan Discharge plan remains appropriate    Co-evaluation                 AM-PAC PT "6 Clicks" Daily Activity     Outcome Measure   Help from another person eating meals?: A Little Help from another person taking care of personal grooming?: A Little Help from another person toileting, which includes using toliet, bedpan, or urinal?: A Little Help from another person bathing (including washing, rinsing, drying)?: A Little Help from another person to put on and taking off regular upper body clothing?: A Little Help from another person to put on and taking off regular lower body clothing?: A Little 6 Click Score: 18    End of Session    OT Visit Diagnosis: Unsteadiness on feet (R26.81);Muscle weakness (generalized) (M62.81)   Activity Tolerance Patient tolerated treatment well   Patient Left in bed   Nurse Communication Other (comment)([employer] does not deal with CPM machine for shoulder)        Time: 1127-1150 OT Time Calculation (min): 23 min  Charges: OT General Charges $OT Visit: 1 Visit OT Treatments $Therapeutic Exercise: 8-22 mins  03/27/2018 Nestor Lewandowsky, OTR/L Pager: [phone] Werner Lean Haze Boyden 03/27/2018, 11:58 AM

## 2018-03-27 NOTE — Progress Notes (Signed)
     Subjective: 2 Days Post-Op Procedure(s) (LRB): LEFT REVERSE SHOULDER ARTHROPLASTY (Left) Slurring of words, baseline. Left shoulder with sling, negative sulcus sign. Left arm is warm. Radial a pulse is  Weak. Baseline. Motor intact.   Patient reports pain as moderate.    Objective:   VITALS:  Temp:  [98 F (36.7 C)-98.7 F (37.1 C)] 98.7 F (37.1 C) (06/28 2041) Pulse Rate:  [60-63] 60 (06/28 2041) Resp:  [16-18] 16 (06/28 2041) BP: (97-121)/(30-51) 97/51 (06/28 2041) SpO2:  [100 %] 100 % (06/28 2041)  ABD soft Sensation intact distally Incision: dressing C/D/I   LABS No results for input(s): HGB, WBC, PLT in the last 72 hours. No results for input(s): NA, K, CL, CO2, BUN, CREATININE, GLUCOSE in the last 72 hours. No results for input(s): LABPT, INR in the last 72 hours.   Assessment/Plan: 2 Days Post-Op Procedure(s) (LRB): LEFT REVERSE SHOULDER ARTHROPLASTY (Left)  Advance diet Up with therapy Discharge to SNF  [name] 03/27/2018, 10:52 AM Patient ID: [name] [name], female   DOB: Aug 15, 1951, 67 y.o.   MRN: [id number]

## 2018-03-27 NOTE — Progress Notes (Signed)
Occupational Therapy Treatment Patient Details Name: [name] [name] MRN: [id number] DOB: 07-Dec-1950 Today's Date: 03/27/2018    History of present illness Pt is a 67 y/o female s/p L reverse shoulder replacement. PMH including but not limited to L transtibial amputation in 2014, CAD, CKD, DM, HTN, PVD and pacemaker placement in 2011.   OT comments  Pt progressing towards OT goals this session. Focus of session was implementation of shoulder CPM. Sister present throughout session. OT placed Pt in CPM with mod A. Pt states "I could never do this myself" To empower and promote self-confidence, OT labeled each part of the CPM utilizing teachback method so that no matter where Ms. Venning discharges to, she will have the ability to direct caregivers in donning the CPM. At the end of session, instructed Ms. Freshour to notify RN staff of 30 min CPM completion so that she can return to sling/immobilizer. OT will continue to follow acutely to reinforce ADL compensatory strategies for TSA and exercises. Next session to focus on Ms. Rojero assuming more of the Special educational needs teacher" role during these tasks.    Follow Up Recommendations  Follow surgeon's recommendation for DC plan and follow-up therapies    Equipment Recommendations  None recommended by OT    Recommendations for Other Services      Precautions / Restrictions Precautions Precautions: Shoulder Type of Shoulder Precautions: PROM FF to 90, abd to 60, ER to 30 and AROM elbow to hand Shoulder Interventions: Shoulder sling/immobilizer;Off for dressing/bathing/exercises Precaution Booklet Issued: Yes (comment) Precaution Comments: previous L BKA Required Braces or Orthoses: Sling Restrictions Weight Bearing Restrictions: Yes LUE Weight Bearing: Non weight bearing       Mobility Bed Mobility               General bed mobility comments: Pt sitting EOB when OT entered room  Transfers                      Balance Overall balance  assessment: Needs assistance Sitting-balance support: No upper extremity supported Sitting balance-Leahy Scale: Good                                     ADL either performed or assessed with clinical judgement   ADL                                         General ADL Comments: reinforced donning and doffing shoulder immobilizer and wearing schedule     Vision       Perception     Praxis      Cognition Arousal/Alertness: Awake/alert Behavior During Therapy: WFL for tasks assessed/performed Overall Cognitive Status: Within Functional Limits for tasks assessed                                 General Comments: Pt self-reports short term memory deficits at baseline        Exercises Exercises: Shoulder Shoulder Exercises Shoulder ABduction: PROM;Left;Other reps (comment);Seated(Use of CPM for 45 degrees shoulder abduction for 30 min)   Shoulder Instructions       General Comments Pt's sister present  throughout session    Pertinent Vitals/ Pain       Pain Assessment: Faces Faces Pain Scale: Hurts  a little bit Pain Location: L shoulder Pain Descriptors / Indicators: Sore;Guarding Pain Intervention(s): Monitored during session;Repositioned  Home Living                                          Prior Functioning/Environment              Frequency  Min 3X/week        Progress Toward Goals  OT Goals(current goals can now be found in the care plan section)  Progress towards OT goals: Progressing toward goals  Acute Rehab OT Goals Patient Stated Goal: be safe and have adequate support prior to return home OT Goal Formulation: With patient/family Time For Goal Achievement: 04/08/18 Potential to Achieve Goals: Good  Plan Discharge plan remains appropriate    Co-evaluation                 AM-PAC PT "6 Clicks" Daily Activity     Outcome Measure   Help from another person eating  meals?: A Little Help from another person taking care of personal grooming?: A Little Help from another person toileting, which includes using toliet, bedpan, or urinal?: A Little Help from another person bathing (including washing, rinsing, drying)?: A Little Help from another person to put on and taking off regular upper body clothing?: A Little Help from another person to put on and taking off regular lower body clothing?: A Little 6 Click Score: 18    End of Session Equipment Utilized During Treatment: Other (comment)(shoulder CPM)  OT Visit Diagnosis: Unsteadiness on feet (R26.81);Muscle weakness (generalized) (M62.81)   Activity Tolerance Patient tolerated treatment well   Patient Left Other (comment);with family/visitor present(sitting EOB)   Nurse Communication Mobility status;Other (comment)(Pt in CPM and will need to come out in 30 min)        Time: 1017-5102 OT Time Calculation (min): 22 min  Charges: OT General Charges $OT Visit: 1 Visit OT Treatments $Self Care/Home Management : 8-22 mins $Therapeutic Exercise: 8-22 mins  Hulda Humphrey OTR/L [place] 03/27/2018, 2:35 PM

## 2018-03-28 LAB — BASIC METABOLIC PANEL
Anion gap: 9 (ref 5–15)
BUN: 72 mg/dL — AB (ref 8–23)
CHLORIDE: 108 mmol/L (ref 98–111)
CO2: 23 mmol/L (ref 22–32)
Calcium: 7.9 mg/dL — ABNORMAL LOW (ref 8.9–10.3)
Creatinine, Ser: 3.71 mg/dL — ABNORMAL HIGH (ref 0.44–1.00)
GFR calc Af Amer: 14 mL/min — ABNORMAL LOW (ref >60)
GFR calc non Af Amer: 12 mL/min — ABNORMAL LOW (ref >60)
GLUCOSE: 137 mg/dL — AB (ref 70–99)
Potassium: 5.1 mmol/L (ref 3.5–5.1)
SODIUM: 140 mmol/L (ref 135–145)

## 2018-03-28 LAB — CBC WITH DIFFERENTIAL/PLATELET
Abs Immature Granulocytes: 0 10*3/uL (ref 0.0–0.1)
BASOS ABS: 0 10*3/uL (ref 0.0–0.1)
Basophils Relative: 0 %
EOS PCT: 7 %
Eosinophils Absolute: 0.4 10*3/uL (ref 0.0–0.7)
HEMATOCRIT: 22.6 % — AB (ref 36.0–46.0)
HEMOGLOBIN: 7 g/dL — AB (ref 12.0–15.0)
Immature Granulocytes: 0 %
Lymphocytes Relative: 25 %
Lymphs Abs: 1.5 10*3/uL (ref 0.7–4.0)
MCH: 30.3 pg (ref 26.0–34.0)
MCHC: 31 g/dL (ref 30.0–36.0)
MCV: 97.8 fL (ref 78.0–100.0)
MONO ABS: 0.6 10*3/uL (ref 0.1–1.0)
Monocytes Relative: 10 %
Neutro Abs: 3.4 10*3/uL (ref 1.7–7.7)
Neutrophils Relative %: 58 %
Platelets: 95 10*3/uL — ABNORMAL LOW (ref 150–400)
RBC: 2.31 MIL/uL — ABNORMAL LOW (ref 3.87–5.11)
RDW: 13.3 % (ref 11.5–15.5)
WBC: 5.9 10*3/uL (ref 4.0–10.5)

## 2018-03-28 NOTE — Progress Notes (Signed)
Notified x 2 to [employer] concerning patient's blood pressure. Awaiting return call.

## 2018-03-28 NOTE — Progress Notes (Signed)
     Subjective: 3 Days Post-Op Procedure(s) (LRB): LEFT REVERSE SHOULDER ARTHROPLASTY (Left) Awake, alert and oriented x 4. Left shoulder dressing intact, has left shoulder abduction pillow intact. Complains of some left arm numbness as preop.  Patient reports pain as moderate.    Objective:   VITALS:  Temp:  [98.1 F (36.7 C)-99.3 F (37.4 C)] 99.3 F (37.4 C) (06/30 0444) Pulse Rate:  [57-58] 58 (06/30 0444) Resp:  [14-16] 14 (06/30 0444) BP: (96-105)/(44-54) 105/44 (06/30 0444) SpO2:  [100 %] 100 % (06/30 0444)  Neurologically intact ABD soft Neurovascular intact Sensation intact distally Intact pulses distally Dorsiflexion/Plantar flexion intact Incision: scant drainage and Left shoulder with normal rounded appearance of the lateral margin, negative sulcus sign. Left arm is warm, radial a pulse is present.  Compartment soft   LABS No results for input(s): HGB, WBC, PLT in the last 72 hours. No results for input(s): NA, K, CL, CO2, BUN, CREATININE, GLUCOSE in the last 72 hours. No results for input(s): LABPT, INR in the last 72 hours.   Assessment/Plan: 3 Days Post-Op Procedure(s) (LRB): LEFT REVERSE SHOULDER ARTHROPLASTY (Left)  Advance diet Up with therapy Plan for discharge tomorrow Discharge to SNF  [name] [name] 03/28/2018, 10:37 AM Patient ID: [name] [name], female   DOB: 02/18/1951, 67 y.o.   MRN: [id number]

## 2018-03-28 NOTE — Progress Notes (Signed)
Occupational Therapy Treatment Patient Details Name: [name] [name] MRN: [id number] DOB: 20-Aug-1951 Today's Date: 03/28/2018    History of present illness Pt is a 67 y/o female s/p L reverse shoulder replacement. PMH including but not limited to L transtibial amputation in 2014, CAD, CKD, DM, HTN, PVD and pacemaker placement in 2011.   OT comments  Continuing to follow pt for L UE exercises, education in use of shoulder CPM and shoulder sling. Pt with poor ability to retain information from previous visit in order to perform exercises or use of CPM on her own. Pt needs 24 hour assist at home and does not have reliable assistance currently. The safest environment for pt for a successful outcome is SNF.   Follow Up Recommendations  Follow surgeon's recommendation for DC plan and follow-up therapies    Equipment Recommendations  None recommended by OT    Recommendations for Other Services      Precautions / Restrictions Precautions Precautions: Shoulder Type of Shoulder Precautions: PROM FF to 90, abd to 60, ER to 30 and AROM elbow to hand Shoulder Interventions: Shoulder sling/immobilizer;Off for dressing/bathing/exercises Precaution Comments: previous L BKA Required Braces or Orthoses: Sling Restrictions Weight Bearing Restrictions: Yes LUE Weight Bearing: Non weight bearing       Mobility Bed Mobility Overal bed mobility: Modified Independent                Transfers                      Balance Overall balance assessment: Needs assistance   Sitting balance-Leahy Scale: Good                                     ADL either performed or assessed with clinical judgement   ADL                                         General ADL Comments: min assist to doff sling, max assist to don, place pt in CPM 45 degrees for 30 minutes seated at EOB. Pt educated in how to don and doff, plug in and turn on so she may direct caregivers. Pt  with minimal recall from yesterdays visit.     Vision       Perception     Praxis      Cognition Arousal/Alertness: Awake/alert Behavior During Therapy: WFL for tasks assessed/performed Overall Cognitive Status: Impaired/Different from baseline Area of Impairment: Memory                     Memory: Decreased short-term memory         General Comments: Pt self-reports short term memory deficits at baseline        Exercises     Shoulder Instructions       General Comments      Pertinent Vitals/ Pain       Pain Assessment: Faces Faces Pain Scale: Hurts even more Pain Location: L shoulder Pain Descriptors / Indicators: Sore;Guarding Pain Intervention(s): Monitored during session  Home Living                                          Prior  Functioning/Environment              Frequency  Min 3X/week        Progress Toward Goals  OT Goals(current goals can now be found in the care plan section)  Progress towards OT goals: Progressing toward goals  Acute Rehab OT Goals Patient Stated Goal: be safe and have adequate support prior to return home OT Goal Formulation: With patient/family Time For Goal Achievement: 04/08/18 Potential to Achieve Goals: Good  Plan Discharge plan remains appropriate    Co-evaluation                 AM-PAC PT "6 Clicks" Daily Activity     Outcome Measure   Help from another person eating meals?: A Little Help from another person taking care of personal grooming?: A Little Help from another person toileting, which includes using toliet, bedpan, or urinal?: A Little Help from another person bathing (including washing, rinsing, drying)?: A Little Help from another person to put on and taking off regular upper body clothing?: A Little Help from another person to put on and taking off regular lower body clothing?: A Little 6 Click Score: 18    End of Session Equipment Utilized During  Treatment: Other (comment)(shoulder CPM)  OT Visit Diagnosis: Unsteadiness on feet (R26.81);Muscle weakness (generalized) (M62.81)   Activity Tolerance Patient tolerated treatment well   Patient Left in bed   Nurse Communication Other (comment)(aware of wearing schedule and donning and doffing shoulder C)        Time: 8250-5397 OT Time Calculation (min): 19 min  Charges: OT General Charges $OT Visit: 1 Visit OT Treatments $Therapeutic Exercise: 8-22 mins  03/28/2018 Nestor Lewandowsky, OTR/L Pager: [phone]   Werner Lean, Haze Boyden 03/28/2018, 9:41 AM

## 2018-03-28 NOTE — Progress Notes (Signed)
Dr. Louanne Skye returned call. Will order labs. Nursing will continue to monitor.

## 2018-03-29 ENCOUNTER — Telehealth: Payer: Self-pay | Admitting: Internal Medicine

## 2018-03-29 ENCOUNTER — Inpatient Hospital Stay (HOSPITAL_COMMUNITY): Payer: Medicaid Other

## 2018-03-29 LAB — TROPONIN I

## 2018-03-29 LAB — PREPARE RBC (CROSSMATCH)

## 2018-03-29 MED ORDER — ACETAMINOPHEN 325 MG PO TABS
650.0000 mg | ORAL_TABLET | Freq: Once | ORAL | Status: AC
  Administered 2018-03-29: 650 mg via ORAL
  Filled 2018-03-29: qty 2

## 2018-03-29 MED ORDER — SODIUM CHLORIDE 0.9 % IV SOLN
INTRAVENOUS | Status: DC
  Administered 2018-03-29 – 2018-04-01 (×5): via INTRAVENOUS

## 2018-03-29 MED ORDER — SODIUM CHLORIDE 0.9% IV SOLUTION
Freq: Once | INTRAVENOUS | Status: AC
  Administered 2018-03-29: 10 mL/h via INTRAVENOUS

## 2018-03-29 MED ORDER — DIPHENHYDRAMINE HCL 25 MG PO CAPS
25.0000 mg | ORAL_CAPSULE | Freq: Once | ORAL | Status: AC
  Administered 2018-03-29: 25 mg via ORAL
  Filled 2018-03-29: qty 1

## 2018-03-29 MED ORDER — FUROSEMIDE 10 MG/ML IJ SOLN
20.0000 mg | Freq: Once | INTRAMUSCULAR | Status: AC
  Administered 2018-03-29: 20 mg via INTRAVENOUS
  Filled 2018-03-29: qty 2

## 2018-03-29 NOTE — Progress Notes (Signed)
Patient ID: [name] [name], female   DOB: September 10, 1951, 67 y.o.   MRN: [id number] Call from nursing for low blood pressure, post left TSA 6/27. SBP 99  Hear rate is 66, but history of HTN and heart block. Lab ordered Hgb 7.0 Hct 22 decreased from preop 11.2/35% Creatinine 3.7 up from pre op 2.5 Will restart her IVF and give 2 units of PRBCs Recheck lab post transfusion.

## 2018-03-29 NOTE — Progress Notes (Signed)
Patient complained that she can not breathe well and was having chest pains. RN paged Rapid Response and Respiratory Therapy both of which came to the bedside. Patient's vitals were at normal baseline and patient's EKG was consistent with her cardiac history. MD was notified of change in patient's status. RN was advised to administer patient ordered pain medication and to continue to monitor patient.

## 2018-03-29 NOTE — Progress Notes (Signed)
Occupational Therapy Treatment Patient Details Name: [name] [name] MRN: [id number] DOB: 05-01-1951 Today's Date: 03/29/2018    History of present illness Pt is a 67 y/o female s/p L reverse shoulder replacement. PMH including but not limited to L transtibial amputation in 2014, CAD, CKD, DM, HTN, PVD and pacemaker placement in 2011.   OT comments  Pt with low hgb, receiving blood so limited activity to bed level exercises. Will return to don shoulder CPM later today once pt is safe to sit at EOB. Continue to recommend 24 hour care and pt lives alone, SNF is the safest d/c disposition for pt.  Follow Up Recommendations  Follow surgeon's recommendation for DC plan and follow-up therapies    Equipment Recommendations  None recommended by OT    Recommendations for Other Services      Precautions / Restrictions Precautions Precautions: Shoulder Type of Shoulder Precautions: PROM FF to 90, abd to 60, ER to 30 and AROM elbow to hand Shoulder Interventions: Shoulder sling/immobilizer;Off for dressing/bathing/exercises Required Braces or Orthoses: Sling Restrictions Weight Bearing Restrictions: Yes LUE Weight Bearing: Non weight bearing       Mobility Bed Mobility                  Transfers                      Balance                                           ADL either performed or assessed with clinical judgement   ADL Overall ADL's : Needs assistance/impaired Eating/Feeding: Minimal assistance;Sitting Eating/Feeding Details (indicate cue type and reason): assist to cut food, open packages                                   General ADL Comments: total assist to don and doff shoulder immobilizer     Vision       Perception     Praxis      Cognition Arousal/Alertness: Awake/alert Behavior During Therapy: WFL for tasks assessed/performed Overall Cognitive Status: Impaired/Different from baseline Area of Impairment:  Memory;Safety/judgement                     Memory: Decreased short-term memory   Safety/Judgement: Decreased awareness of safety              Exercises Exercises: Shoulder;General Upper Extremity General Exercises - Upper Extremity Shoulder Flexion: PROM;Left;10 reps;Supine(to 90) Shoulder ABduction: PROM;Left;10 reps;Supine(to 60) Elbow Flexion: AROM;10 reps;Left;Supine Elbow Extension: AROM;Left;10 reps;Supine Wrist Flexion: AROM;Left;10 reps;Supine Wrist Extension: AROM;Both;10 reps;Supine Digit Composite Flexion: AROM;Left;10 reps;Supine Composite Extension: AROM;Left;10 reps;Supine   Shoulder Instructions       General Comments      Pertinent Vitals/ Pain       Pain Assessment: Faces Faces Pain Scale: Hurts even more Pain Location: L shoulder Pain Descriptors / Indicators: Sore;Guarding Pain Intervention(s): Monitored during session;Premedicated before session;Repositioned  Home Living                                          Prior Functioning/Environment              Frequency  Min 3X/week        Progress Toward Goals  OT Goals(current goals can now be found in the care plan section)  Progress towards OT goals: Not progressing toward goals - comment(pt limited by low hgb)  Acute Rehab OT Goals Patient Stated Goal: be safe and have adequate support prior to return home OT Goal Formulation: With patient/family Time For Goal Achievement: 04/08/18 Potential to Achieve Goals: Good  Plan Discharge plan remains appropriate    Co-evaluation                 AM-PAC PT "6 Clicks" Daily Activity     Outcome Measure   Help from another person eating meals?: A Little Help from another person taking care of personal grooming?: A Little Help from another person toileting, which includes using toliet, bedpan, or urinal?: A Little Help from another person bathing (including washing, rinsing, drying)?: A Little Help from  another person to put on and taking off regular upper body clothing?: A Little Help from another person to put on and taking off regular lower body clothing?: A Little 6 Click Score: 18    End of Session    OT Visit Diagnosis: Unsteadiness on feet (R26.81);Muscle weakness (generalized) (M62.81);Pain;Other symptoms and signs involving cognitive function Pain - Right/Left: Left Pain - part of body: Shoulder   Activity Tolerance Treatment limited secondary to medical complications (Comment)(pt with hgb of 7, receiving blood)   Patient Left in bed;with bed alarm set;with family/visitor present   Nurse Communication          Time: 9311-2162 OT Time Calculation (min): 29 min  Charges: OT General Charges $OT Visit: 1 Visit OT Treatments $Therapeutic Exercise: 23-37 mins  03/29/2018 [name] [name], OTR/L Pager: [name] [name] 03/29/2018, 10:33 AM

## 2018-03-29 NOTE — Progress Notes (Signed)
Patient refused bed alarm. Falls education enforced with patient. Patient verbalizes understanding and stated " I will call for help before getting up". Call bell and personal items within reach of patient.

## 2018-03-29 NOTE — Significant Event (Signed)
Rapid Response Event Note  Overview: Respiratory - Shortness of Breath  Initial Focused Assessment: Received calls from multiple RNs about patient having shortness of breath and chest tightness. I was with another patient in a medical emergency, I asked the RNs to call RT and informed them that I would come see the patient as soon as I could.   I arrived at 60, patient was laying in bed, not in acute distress, stated the she has chest tightness/pressure over her pacemaker site and into her shoulder (s/p L shoulder surgery). Per RN, patient had gotten up to the bathroom and then this shortness of breath/chest heaviness started. Lung sounds clear throughout, normal breathing pattern, RR is regular and even.  Good capillary refill, + 2 pulses, warm to touch. Neuro intact.   Interventions: -- EKG -- CXR  -- Troponin x 1  Plan of Care: -- Monitor Troponin -- Oxycodone 5mg  PRN for pain  Event Summary:   at    Call Time [place] End Time [place], [place]

## 2018-03-29 NOTE — Progress Notes (Signed)
Pt seen for second visit following blood transfusion for L UE exercises and placement/use of shoulder CPM. Tolerated well. Appeared to be feeling much better.   03/29/18 1600  OT Visit Information  Last OT Received On 03/29/18  Assistance Needed +1  History of Present Illness Pt is a 67 y/o female s/p L reverse shoulder replacement. PMH including but not limited to L transtibial amputation in 2014, CAD, CKD, DM, HTN, PVD and pacemaker placement in 2011.  Precautions  Precautions Shoulder  Type of Shoulder Precautions PROM FF to 90, abd to 60, ER to 30 and AROM elbow to hand  Shoulder Interventions Shoulder sling/immobilizer;Off for dressing/bathing/exercises  Precaution Comments previous L BKA  Required Braces or Orthoses Sling  Pain Assessment  Pain Assessment Faces  Faces Pain Scale 4  Pain Location L shoulder  Pain Descriptors / Indicators Sore;Guarding  Pain Intervention(s) Monitored during session;Repositioned;Premedicated before session  Cognition  Arousal/Alertness Awake/alert  Behavior During Therapy WFL for tasks assessed/performed  Overall Cognitive Status Impaired/Different from baseline  Area of Impairment Memory;Safety/judgement  Memory Decreased short-term memory  Safety/Judgement Decreased awareness of safety  Upper Extremity Assessment  Upper Extremity Assessment LUE deficits/detail  LUE Deficits / Details post-op deficits as anticipated  LUE Sensation WNL  LUE Coordination decreased gross motor  Lower Extremity Assessment  LLE Deficits / Details previous transtibial amputation (2014); has a prosthesis that she only wears to [employer]  ADL  Upper Body Dressing  Minimal assistance;Sitting  Upper Body Dressing Details (indicate cue type and reason) educated to dress L UE first when donning front opening gown  Bed Mobility  Overal bed mobility Modified Independent  Balance  Overall balance assessment Needs assistance  Sitting-balance support No upper extremity  supported  Sitting balance-Leahy Scale Good  Restrictions  Weight Bearing Restrictions Yes  LUE Weight Bearing NWB  Exercises  Exercises General Upper Extremity  General Exercises - Upper Extremity  Shoulder Flexion Self ROM;Supine;10 reps (to 90)  Shoulder ABduction Left;AAROM;Seated;10 reps  Elbow Flexion AROM;10 reps;Left;Supine  Elbow Extension AROM;Left;10 reps;Supine  Wrist Flexion AROM;Left;10 reps;Supine  Wrist Extension AROM;Both;10 reps;Supine  Digit Composite Flexion AROM;Left;10 reps;Supine  Composite Extension AROM;Left;10 reps;Supine  OT - End of Session  Equipment Utilized During Treatment Other (comment) (shoulder immobilizer)  Activity Tolerance Patient tolerated treatment well  Patient left in bed;with call bell/phone within reach (with PT in room)  OT Assessment/Plan  OT Plan Discharge plan remains appropriate  OT Visit Diagnosis Unsteadiness on feet (R26.81);Muscle weakness (generalized) (M62.81);Pain;Other symptoms and signs involving cognitive function  Pain - Right/Left Left  Pain - part of body Shoulder  OT Frequency (ACUTE ONLY) Min 3X/week  Follow Up Recommendations Follow surgeon's recommendation for DC plan and follow-up therapies  OT Equipment None recommended by OT  AM-PAC OT "6 Clicks" Daily Activity Outcome Measure  Help from another person eating meals? 3  Help from another person taking care of personal grooming? 3  Help from another person toileting, which includes using toliet, bedpan, or urinal? 3  Help from another person bathing (including washing, rinsing, drying)? 3  Help from another person to put on and taking off regular upper body clothing? 3  Help from another person to put on and taking off regular lower body clothing? 3  6 Click Score 18  ADL G Code Conversion CK  OT Goal Progression  Progress towards OT goals Progressing toward goals  Acute Rehab OT Goals  Patient Stated Goal be safe and have adequate support prior to return  home  OT Goal Formulation With patient/family  Time For Goal Achievement 04/08/18  Potential to Achieve Goals Good  OT Time Calculation  OT Start Time (ACUTE ONLY) 1510  OT Stop Time (ACUTE ONLY) 1540  OT Time Calculation (min) 30 min  OT General Charges  $OT Visit 1 Visit  OT Treatments  $Therapeutic Exercise 23-37 mins  03/29/2018 Nestor Lewandowsky, OTR/L Pager: [phone]

## 2018-03-29 NOTE — Progress Notes (Addendum)
0710 Blood transfusion infiltrated, warm compress applied. New IV restarted by IV team. Restarted BT at 0803. Right forearm is swelling is better and softer. 1100 First unit of blood transfusion completed. No BT reaction noted.

## 2018-03-29 NOTE — Progress Notes (Signed)
Physical Therapy Treatment Patient Details Name: [name] [name] MRN: [id number] DOB: 07/07/1951 Today's Date: 03/29/2018    History of Present Illness Pt is a 67 y/o female s/p L reverse shoulder replacement. PMH including but not limited to L transtibial amputation in 2014, CAD, CKD, DM, HTN, PVD and pacemaker placement in 2011.    PT Comments    Pt demonstrated significantly increased distance in WC mobility without increased fatigue. She has partially met her WC mobility goal, and will fully meet it with two trials of similar distance & independence. She had low BP in sitting, but had no complaints of dizziness or light-headedness during treatment session.  Follow Up Recommendations  Home health PT;Supervision - Intermittent     Equipment Recommendations  None recommended by PT    Recommendations for Other Services       Precautions / Restrictions Precautions Precautions: Shoulder Type of Shoulder Precautions: PROM FF to 90, abd to 60, ER to 30 and AROM elbow to hand Shoulder Interventions: Shoulder sling/immobilizer;Off for dressing/bathing/exercises Precaution Booklet Issued: Yes (comment) Precaution Comments: previous L BKA Required Braces or Orthoses: Sling Restrictions Weight Bearing Restrictions: Yes LUE Weight Bearing: Non weight bearing    Mobility  Bed Mobility Overal bed mobility: Modified Independent             General bed mobility comments: Pt sitting EOB when STPA entered room  Transfers Overall transfer level: Needs assistance Equipment used: None Transfers: Stand Pivot Transfers   Stand pivot transfers: Supervision       General transfer comment: Supervision for safety; pt steady with transfer  Ambulation/Gait                 Theme park manager mobility: Yes Wheelchair propulsion: Right upper extremity;Right lower extremity Wheelchair parts: Supervision/cueing Distance:  [place] Details (indicate cue type and reason): Supervision for pacing & safety (poor perception of line placement)  Modified Rankin (Stroke Patients Only)       Balance Overall balance assessment: Needs assistance Sitting-balance support: No upper extremity supported Sitting balance-Leahy Scale: Good     Standing balance support: During functional activity;Single extremity supported Standing balance-Leahy Scale: Poor                              Cognition Arousal/Alertness: Awake/alert Behavior During Therapy: WFL for tasks assessed/performed Overall Cognitive Status: Within Functional Limits for tasks assessed Area of Impairment: Memory;Safety/judgement                     Memory: Decreased short-term memory   Safety/Judgement: Decreased awareness of safety            Exercises     General Comments        Pertinent Vitals/Pain Pain Assessment: Faces Faces Pain Scale: Hurts little more Pain Location: L shoulder Pain Descriptors / Indicators: Sore;Guarding Pain Intervention(s): Monitored during session;Repositioned    Home Living                      Prior Function            PT Goals (current goals can now be found in the care plan section) Acute Rehab PT Goals Patient Stated Goal: be safe and have adequate support prior to return home PT Goal Formulation: With patient/family Potential to Achieve Goals: Good Additional Goals  Additional Goal #1: Pt will propel herself in her w/c 200' with modified independence in 3/3 trials. Progress towards PT goals: Progressing toward goals    Frequency    Min 3X/week      PT Plan      Co-evaluation              AM-PAC PT "6 Clicks" Daily Activity  Outcome Measure  Difficulty turning over in bed (including adjusting bedclothes, sheets and blankets)?: None Difficulty moving from lying on back to sitting on the side of the bed? : None Difficulty sitting  down on and standing up from a chair with arms (e.g., wheelchair, bedside commode, etc,.)?: Unable Help needed moving to and from a bed to chair (including a wheelchair)?: None Help needed walking in hospital room?: Total Help needed climbing 3-5 steps with a railing? : Total 6 Click Score: 15    End of Session   Activity Tolerance: Patient tolerated treatment well Patient left: with call bell/phone within reach;Other (comment)(in WC)   PT Visit Diagnosis: Other abnormalities of gait and mobility (R26.89)     Time: 4235-3614 PT Time Calculation (min) (ACUTE ONLY): 27 min  Charges:  $Therapeutic Activity: 8-22 mins $Wheel Chair Management: 8-22 mins                    G Codes:       Scarlett Presto, SPTA   Chinedu Agustin Sheila Oats 03/29/2018, 5:31 PM

## 2018-03-30 ENCOUNTER — Encounter (HOSPITAL_COMMUNITY): Payer: Self-pay | Admitting: Internal Medicine

## 2018-03-30 ENCOUNTER — Other Ambulatory Visit: Payer: Self-pay

## 2018-03-30 DIAGNOSIS — Z471 Aftercare following joint replacement surgery: Secondary | ICD-10-CM

## 2018-03-30 DIAGNOSIS — Z96612 Presence of left artificial shoulder joint: Secondary | ICD-10-CM

## 2018-03-30 LAB — TYPE AND SCREEN
ABO/RH(D): O POS
ANTIBODY SCREEN: NEGATIVE
UNIT DIVISION: 0
Unit division: 0

## 2018-03-30 LAB — CBC
HCT: 31 % — ABNORMAL LOW (ref 36.0–46.0)
HEMATOCRIT: 33.5 % — AB (ref 36.0–46.0)
HEMOGLOBIN: 10.5 g/dL — AB (ref 12.0–15.0)
HEMOGLOBIN: 9.8 g/dL — AB (ref 12.0–15.0)
MCH: 29.7 pg (ref 26.0–34.0)
MCH: 29.7 pg (ref 26.0–34.0)
MCHC: 31.3 g/dL (ref 30.0–36.0)
MCHC: 31.6 g/dL (ref 30.0–36.0)
MCV: 93.9 fL (ref 78.0–100.0)
MCV: 94.6 fL (ref 78.0–100.0)
Platelets: 113 10*3/uL — ABNORMAL LOW (ref 150–400)
Platelets: 124 10*3/uL — ABNORMAL LOW (ref 150–400)
RBC: 3.54 MIL/uL — AB (ref 3.87–5.11)
RBC: 3.3 MIL/uL — AB (ref 3.87–5.11)
RDW: 14.6 % (ref 11.5–15.5)
RDW: 14.6 % (ref 11.5–15.5)
WBC: 6.8 10*3/uL (ref 4.0–10.5)
WBC: 7.5 10*3/uL (ref 4.0–10.5)

## 2018-03-30 LAB — BASIC METABOLIC PANEL
ANION GAP: 5 (ref 5–15)
ANION GAP: 9 (ref 5–15)
BUN: 73 mg/dL — ABNORMAL HIGH (ref 8–23)
BUN: 72 mg/dL — ABNORMAL HIGH (ref 8–23)
CHLORIDE: 111 mmol/L (ref 98–111)
CO2: 24 mmol/L (ref 22–32)
CO2: 23 mmol/L (ref 22–32)
Calcium: 7.9 mg/dL — ABNORMAL LOW (ref 8.9–10.3)
Calcium: 8.1 mg/dL — ABNORMAL LOW (ref 8.9–10.3)
Chloride: 108 mmol/L (ref 98–111)
Creatinine, Ser: 3.75 mg/dL — ABNORMAL HIGH (ref 0.44–1.00)
Creatinine, Ser: 3.57 mg/dL — ABNORMAL HIGH (ref 0.44–1.00)
GFR calc Af Amer: 14 mL/min — ABNORMAL LOW (ref >60)
GFR calc non Af Amer: 12 mL/min — ABNORMAL LOW (ref >60)
GFR, EST AFRICAN AMERICAN: 14 mL/min — AB (ref >60)
GFR, EST NON AFRICAN AMERICAN: 12 mL/min — AB (ref >60)
Glucose, Bld: 161 mg/dL — ABNORMAL HIGH (ref 70–99)
Glucose, Bld: 99 mg/dL (ref 70–99)
POTASSIUM: 5.7 mmol/L — AB (ref 3.5–5.1)
Potassium: 6 mmol/L — ABNORMAL HIGH (ref 3.5–5.1)
Sodium: 140 mmol/L (ref 135–145)
Sodium: 140 mmol/L (ref 135–145)

## 2018-03-30 LAB — BPAM RBC
BLOOD PRODUCT EXPIRATION DATE: 201907292359
Blood Product Expiration Date: 201907262359
ISSUE DATE / TIME: 201907010510
ISSUE DATE / TIME: 201907011726
Unit Type and Rh: 5100
Unit Type and Rh: 5100

## 2018-03-30 MED ORDER — PATIROMER SORBITEX CALCIUM 8.4 G PO PACK
16.8000 g | PACK | Freq: Every day | ORAL | Status: DC
  Administered 2018-03-31 (×2): 16.8 g via ORAL
  Filled 2018-03-30 (×5): qty 2

## 2018-03-30 MED ORDER — SODIUM POLYSTYRENE SULFONATE PO POWD
15.0000 g | Freq: Once | ORAL | Status: AC
  Administered 2018-03-30: 15 g via ORAL
  Filled 2018-03-30: qty 15

## 2018-03-30 MED ORDER — SODIUM POLYSTYRENE SULFONATE 15 GM/60ML PO SUSP
15.0000 g | Freq: Once | ORAL | Status: AC
  Administered 2018-03-30: 15 g via ORAL
  Filled 2018-03-30 (×2): qty 60

## 2018-03-30 MED ORDER — SODIUM POLYSTYRENE SULFONATE 15 GM/60ML PO SUSP: 30.0000 g | Freq: Once | ORAL | Status: DC

## 2018-03-30 NOTE — Progress Notes (Signed)
Physical Therapy Treatment Patient Details Name: [name] [name] MRN: [id number] DOB: 03-03-1951 Today's Date: 03/30/2018    History of Present Illness Pt is a 67 y/o female s/p L reverse shoulder replacement. PMH including but not limited to L transtibial amputation in 2014, CAD, CKD, DM, HTN, PVD and pacemaker placement in 2011.    PT Comments    Pt progressing well with w/c mobility but shows poor safety awareness and STM so changed recommendation to SNF as pt lives alone. Worked on L knee ROM since she has been sitting with knee flexed much of the time and does not have prosthesis to practice her standing. PT will continue to follow.    Follow Up Recommendations  SNF;Supervision for mobility/OOB     Equipment Recommendations  None recommended by PT    Recommendations for Other Services       Precautions / Restrictions Precautions Precautions: Shoulder Type of Shoulder Precautions: PROM FF to 90, abd to 60, ER to 30 and AROM elbow to hand Shoulder Interventions: Shoulder sling/immobilizer;Off for dressing/bathing/exercises Precaution Booklet Issued: No Precaution Comments: previous L BKA Required Braces or Orthoses: Sling Restrictions Weight Bearing Restrictions: Yes LUE Weight Bearing: Non weight bearing    Mobility  Bed Mobility               General bed mobility comments: pt received in w/c  Transfers                 General transfer comment: NT  Ambulation/Gait                 Theme park manager mobility: Yes Wheelchair propulsion: Right upper extremity;Right lower extremity Wheelchair parts: Supervision/cueing Distance: 400 Wheelchair Assistance Details (indicate cue type and reason): pt needed rest breaks every 100'  Modified Rankin (Stroke Patients Only)       Balance                                            Cognition Arousal/Alertness:  Awake/alert Behavior During Therapy: WFL for tasks assessed/performed Overall Cognitive Status: Impaired/Different from baseline Area of Impairment: Memory;Safety/judgement;Attention                   Current Attention Level: Sustained Memory: Decreased short-term memory   Safety/Judgement: Decreased awareness of safety     General Comments: pt shows decreased safety awareness and very little ability to perform teach back when discussing WB status and appropriate activity level      Exercises Amputee Exercises Knee Flexion: AROM;Left;15 reps;Seated Knee Extension: AROM;Left;15 reps;Seated    General Comments General comments (skin integrity, edema, etc.): pt's arm not properly in sling, helped her reposition and educated pt on attending to arm position and correcting when necessary. Discussed wearing prosthesis as soon as she d/c and pt does not seem to understand that she will not be able to use RW and ambulate due to RUE NWB, Discussed performing ther ex with prosthesis and practicing sit to stand with LUE but pt will need reinforcement of this      Pertinent Vitals/Pain Pain Assessment: Faces Faces Pain Scale: Hurts little more Pain Location: L shoulder Pain Descriptors / Indicators: Sore Pain Intervention(s): Limited activity within patient's tolerance;Monitored during session    Home Living Family/patient expects to be discharged to::  Private residence Living Arrangements: Alone                  Prior Function            PT Goals (current goals can now be found in the care plan section) Acute Rehab PT Goals Patient Stated Goal: be safe and have adequate support prior to return home PT Goal Formulation: With patient/family Time For Goal Achievement: 04/09/18 Potential to Achieve Goals: Good Progress towards PT goals: Progressing toward goals    Frequency    Min 3X/week      PT Plan Discharge plan needs to be updated    Co-evaluation               AM-PAC PT "6 Clicks" Daily Activity  Outcome Measure  Difficulty turning over in bed (including adjusting bedclothes, sheets and blankets)?: None Difficulty moving from lying on back to sitting on the side of the bed? : None Difficulty sitting down on and standing up from a chair with arms (e.g., wheelchair, bedside commode, etc,.)?: Unable Help needed moving to and from a bed to chair (including a wheelchair)?: None Help needed walking in hospital room?: Total Help needed climbing 3-5 steps with a railing? : Total 6 Click Score: 15    End of Session   Activity Tolerance: Patient tolerated treatment well Patient left: in chair;with call bell/phone within reach;with nursing/sitter in room Nurse Communication: Mobility status PT Visit Diagnosis: Other abnormalities of gait and mobility (R26.89)     Time: 8184-0375 PT Time Calculation (min) (ACUTE ONLY): 21 min  Charges:  $Therapeutic Exercise: 8-22 mins                    G Codes:       Leighton Roach, PT  Acute Rehab Services  [place] 03/30/2018, 4:15 PM

## 2018-03-30 NOTE — Progress Notes (Signed)
Subjective: Patient stable - pain ok - recd 2 u prbc yesterday - doing mod well with ot  Objective: Vital signs in last 24 hours: Temp:  [97.7 F (36.5 C)-99.1 F (37.3 C)] 98.6 F (37 C) (07/02 0408) Pulse Rate:  [60-65] 65 (07/02 0408) Resp:  [16-18] 18 (07/02 0408) BP: (104-138)/(40-66) 135/51 (07/02 0408) SpO2:  [98 %-100 %] 100 % (07/02 0408)  Intake/Output from previous day: 07/01 0701 - 07/02 0700 In: 883 [P.O.:360; Blood:523] Out: -  Intake/Output this shift: Total I/O In: 120 [P.O.:120] Out: -   Exam:  No cellulitis present Compartment soft  Labs: Recent Labs    03/28/18 2230 03/29/18 2338  HGB 7.0* 9.8*   Recent Labs    03/28/18 2230 03/29/18 2338  WBC 5.9 7.5  RBC 2.31* 3.30*  HCT 22.6* 31.0*  PLT 95* 113*   Recent Labs    03/28/18 2230 03/29/18 2338  NA 140 140  K 5.1 6.0*  CL 108 108  CO2 23 23  BUN 72* 73*  CREATININE 3.71* 3.75*  GLUCOSE 137* 161*  CALCIUM 7.9* 7.9*   No results for input(s): LABPT, INR in the last 72 hours.  Assessment/Plan: Plan is med consult for increased k and cr - kaexcelate given - repeat bmet pending - continue cpm and ot   [employer] 03/30/2018, 12:08 PM

## 2018-03-30 NOTE — Care Management (Signed)
Patient was insisting that she be allowed to leave the hospital to pay her bills, stating she would return afterwards. CM contacted social Dorien Chihuahua who spoke with director of SW department, [facility],  and was informed we can not allow this. CM contacted [facility]- Ron Valda Lamb who also said we no longer give patient's hospital pass due to liability. Case manager spoke with patient's attending, Dr. Marlou Sa who determined patient should not leave today. CM spoke with patient and asked for permission to contact the manager at her apartment complex to explain patient is hospitalized.  Case manager received permission and contacted Linus Orn at [facility], who said that she understood patient's situation and she will be allowed to pay her rent next week if necessary, and they will waive late fee for her. Patient expressed relieve and has resigned herself to remaining in hospital for continued medical care.
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
## 2018-03-30 NOTE — Progress Notes (Signed)
Occupational Therapy Treatment Patient Details Name: [name] [name] MRN: [id number] DOB: 17-Nov-1950 Today's Date: 03/30/2018    History of present illness Pt is a 67 y/o female s/p L reverse shoulder replacement. PMH including but not limited to L transtibial amputation in 2014, CAD, CKD, DM, HTN, PVD and pacemaker placement in 2011.   OT comments  Patient participates well, flat affect this morning but willing to participate in therapy.  Patient continues to require cueing to recall shoulder exercises, precautions and safety, but demonstrating increased mobility initially with exercises at shoulder (able to reach 90 flexion within 3-4 repetitions). Required min cueing to adhere to L UE NWB precautions while adjusting position EOB, cueing to recall how to don sling and CPM, tolerated CPM for 30 minutes (therapist returned after session to remove patient from CPM). Patient progressing well, but continue to recommend further therapist at SNF level in order to maximize safety due to decreased short term memory with sling use and precautions.    Follow Up Recommendations  Follow surgeon's recommendation for DC plan and follow-up therapies    Equipment Recommendations  None recommended by OT    Recommendations for Other Services      Precautions / Restrictions Precautions Precautions: Shoulder Type of Shoulder Precautions: PROM FF to 90, abd to 60, ER to 30 and AROM elbow to hand Shoulder Interventions: Shoulder sling/immobilizer;Off for dressing/bathing/exercises Precaution Booklet Issued: No(issued in prior session ) Precaution Comments: previous L BKA Required Braces or Orthoses: Sling Restrictions Weight Bearing Restrictions: Yes LUE Weight Bearing: Non weight bearing       Mobility Bed Mobility Overal bed mobility: Needs Assistance Bed Mobility: Supine to Sit     Supine to sit: Supervision     General bed mobility comments: for safety, cueing to avoid WB through L UE    Transfers                      Balance Overall balance assessment: Needs assistance Sitting-balance support: No upper extremity supported Sitting balance-Leahy Scale: Good                                     ADL either performed or assessed with clinical judgement   ADL Overall ADL's : Needs assistance/impaired Eating/Feeding: Set up;Sitting                                           Vision       Perception     Praxis      Cognition Arousal/Alertness: Awake/alert Behavior During Therapy: Flat affect Overall Cognitive Status: Impaired/Different from baseline Area of Impairment: Memory;Safety/judgement                     Memory: Decreased short-term memory   Safety/Judgement: Decreased awareness of safety              Exercises Exercises: General Upper Extremity General Exercises - Upper Extremity Shoulder Flexion: Self ROM;Left;10 reps;Supine(gradually increasing to 90 with encouragement) Shoulder ABduction: Self ROM;Left;10 reps;Seated Elbow Flexion: AROM;10 reps;Left;Supine Elbow Extension: AROM;Left;10 reps;Supine Wrist Flexion: AROM;Left;10 reps;Seated Wrist Extension: AROM;Left;10 reps;Seated Digit Composite Flexion: AROM;Left;10 reps;Seated Composite Extension: AROM;10 reps;Left;Seated Shoulder Exercises Shoulder ABduction: PROM;Left;Other reps (comment)(use of CPM for 45 degrees shoulder abduction for 30 minutes ) Shoulder External Rotation: Self ROM;Left;10  reps;Supine(to 30)   Shoulder Instructions       General Comments      Pertinent Vitals/ Pain       Pain Assessment: Faces Faces Pain Scale: Hurts little more Pain Location: L shoulder Pain Descriptors / Indicators: Operative site guarding;Sore Pain Intervention(s): Limited activity within patient's tolerance;Monitored during session;Repositioned  Home Living                                          Prior  Functioning/Environment              Frequency  Min 3X/week        Progress Toward Goals  OT Goals(current goals can now be found in the care plan section)  Progress towards OT goals: Progressing toward goals  Acute Rehab OT Goals Patient Stated Goal: be safe and have adequate support prior to return home OT Goal Formulation: With patient/family Time For Goal Achievement: 04/08/18 Potential to Achieve Goals: Good  Plan Discharge plan remains appropriate;Frequency remains appropriate    Co-evaluation                 AM-PAC PT "6 Clicks" Daily Activity     Outcome Measure   Help from another person eating meals?: A Little Help from another person taking care of personal grooming?: A Little Help from another person toileting, which includes using toliet, bedpan, or urinal?: A Little Help from another person bathing (including washing, rinsing, drying)?: A Little Help from another person to put on and taking off regular upper body clothing?: A Little Help from another person to put on and taking off regular lower body clothing?: A Little 6 Click Score: 18    End of Session Equipment Utilized During Treatment: (shoulder immobilizer sling, shoulder CPM)  OT Visit Diagnosis: Unsteadiness on feet (R26.81);Muscle weakness (generalized) (M62.81);Pain;Other symptoms and signs involving cognitive function Pain - Right/Left: Left Pain - part of body: Shoulder   Activity Tolerance Patient tolerated treatment well   Patient Left with call bell/phone within reach;with bed alarm set(seated EOB )   Nurse Communication (patient needs (wants bath) )        Time: 5176-1607 OT Time Calculation (min): 17 min  Charges: OT General Charges $OT Visit: 1 Visit OT Treatments $Therapeutic Exercise: 8-22 mins  Delight Stare, OTR/L  Pager [place] 03/30/2018, 10:24 AM
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
## 2018-03-31 DIAGNOSIS — Z471 Aftercare following joint replacement surgery: Secondary | ICD-10-CM

## 2018-03-31 DIAGNOSIS — Z96612 Presence of left artificial shoulder joint: Secondary | ICD-10-CM

## 2018-03-31 LAB — BASIC METABOLIC PANEL
ANION GAP: 7 (ref 5–15)
BUN: 71 mg/dL — ABNORMAL HIGH (ref 8–23)
CHLORIDE: 110 mmol/L (ref 98–111)
CO2: 22 mmol/L (ref 22–32)
Calcium: 7.9 mg/dL — ABNORMAL LOW (ref 8.9–10.3)
Creatinine, Ser: 3.1 mg/dL — ABNORMAL HIGH (ref 0.44–1.00)
GFR calc non Af Amer: 15 mL/min — ABNORMAL LOW (ref >60)
GFR, EST AFRICAN AMERICAN: 17 mL/min — AB (ref >60)
GLUCOSE: 131 mg/dL — AB (ref 70–99)
Potassium: 5 mmol/L (ref 3.5–5.1)
Sodium: 139 mmol/L (ref 135–145)

## 2018-03-31 NOTE — Progress Notes (Signed)
Occupational Therapy Treatment Patient Details Name: [name] [name] MRN: [id number] DOB: 1951/04/18 Today's Date: 03/31/2018    History of present illness Pt is a 67 y/o female s/p L reverse shoulder replacement. PMH including but not limited to L transtibial amputation in 2014, CAD, CKD, DM, HTN, PVD and pacemaker placement in 2011.   OT comments  Patient with slow progress.  She reports not completing ROM exercises outside of therapy, unsure of compliance with NWB and no AROM at L shoulder as patient reports completing "shoulder circles as demonstrated" at night.  Encouraged patient to follow program handouts and completion at least 1x outside of therapy in order to maximize ROM and decrease stiffness in L shoulder.  Limited carryover due to decreased STM.  Reviewed exercises and encouragement with ROM today, patient refused CPM to L shoulder this afternoon.  Continue to recommend SNF as safest dc plan.  Will continue to follow.    Follow Up Recommendations  Follow surgeon's recommendation for DC plan and follow-up therapies    Equipment Recommendations  None recommended by OT    Recommendations for Other Services      Precautions / Restrictions Precautions Precautions: Shoulder Type of Shoulder Precautions: PROM FF to 90, abd to 60, ER to 30 and AROM elbow to hand Shoulder Interventions: Shoulder sling/immobilizer;Off for dressing/bathing/exercises Precaution Booklet Issued: No(issued in prior session) Precaution Comments: previous L BKA/prosthesis at home  Required Braces or Orthoses: Sling Restrictions Weight Bearing Restrictions: Yes LUE Weight Bearing: Non weight bearing Other Position/Activity Restrictions: pt aware        Mobility Bed Mobility Overal bed mobility: Modified Independent             General bed mobility comments: pt able to self perform  Transfers Overall transfer level: Needs assistance Equipment used: None Transfers: Sit to/from Stand Sit to  Stand: Modified independent (Device/Increase time);Supervision Stand pivot transfers: Modified independent (Device/Increase time);Supervision       General transfer comment: good safety tech and awareness.  Good use of R UE hand placement.  Pt able to self transfer on/off bed to personal wc and able to transfer on/off toilet     Balance                                           ADL either performed or assessed with clinical judgement   ADL                                               Vision       Perception     Praxis      Cognition Arousal/Alertness: Awake/alert Behavior During Therapy: [facility] for tasks assessed/performed Overall Cognitive Status: Within Functional Limits for tasks assessed Area of Impairment: Memory;Safety/judgement                   Current Attention Level: Sustained Memory: Decreased short-term memory;Decreased recall of precautions         General Comments: patient appears at prior level, has difficutly recalling exercises to complete and reports completing shoulder AROM in bed, educated on precautions and PROM at L shoulder only         Exercises General Exercises - Upper Extremity Shoulder Flexion: Self ROM;Left;10 reps;Supine(to 90) Shoulder ABduction: Self  ROM;Left;10 reps;Supine Elbow Flexion: AROM;10 reps;Left;Supine Elbow Extension: AROM;Left;10 reps;Supine Wrist Flexion: AROM;Left;10 reps;Supine Wrist Extension: AROM;Left;10 reps;Supine Digit Composite Flexion: AROM;Left;10 reps;Supine Composite Extension: AROM;10 reps;Left;Supine Shoulder Exercises Shoulder External Rotation: Self ROM;Left;10 reps;Supine(to 30)   Shoulder Instructions       General Comments      Pertinent Vitals/ Pain       Pain Assessment: Faces Pain Score: 5  Faces Pain Scale: Hurts little more Pain Location: L shoulder Pain Descriptors / Indicators: Aching;Sore Pain Intervention(s): Monitored during  session;Repositioned  Home Living                                          Prior Functioning/Environment              Frequency  Min 3X/week        Progress Toward Goals  OT Goals(current goals can now be found in the care plan section)  Progress towards OT goals: Progressing toward goals  Acute Rehab OT Goals Patient Stated Goal: be safe and have adequate support prior to return home OT Goal Formulation: With patient Time For Goal Achievement: 04/08/18 Potential to Achieve Goals: Good  Plan Discharge plan remains appropriate;Frequency remains appropriate    Co-evaluation                 AM-PAC PT "6 Clicks" Daily Activity     Outcome Measure   Help from another person eating meals?: A Little Help from another person taking care of personal grooming?: A Little Help from another person toileting, which includes using toliet, bedpan, or urinal?: A Little Help from another person bathing (including washing, rinsing, drying)?: A Little Help from another person to put on and taking off regular upper body clothing?: A Little Help from another person to put on and taking off regular lower body clothing?: A Little 6 Click Score: 18    End of Session    OT Visit Diagnosis: Unsteadiness on feet (R26.81);Muscle weakness (generalized) (M62.81);Pain;Other symptoms and signs involving cognitive function Pain - Right/Left: Left Pain - part of body: Shoulder   Activity Tolerance Patient tolerated treatment well   Patient Left with call bell/phone within reach;in bed   Nurse Communication          Time: 1103-1594 OT Time Calculation (min): 11 min  Charges: OT General Charges $OT Visit: 1 Visit OT Treatments $Therapeutic Exercise: 8-22 mins  Delight Stare, OTR/L  Pager [place] 03/31/2018, 4:18 PM

## 2018-03-31 NOTE — Progress Notes (Signed)
S:No complaints O:BP (!) 103/57 (BP Location: Left Arm)   Pulse (!) 59   Temp 98.4 F (36.9 C) (Oral)   Resp 16   SpO2 100%   Intake/Output Summary (Last 24 hours) at 03/31/2018 1541 Last data filed at 03/31/2018 0904 Gross per 24 hour  Intake 2652.52 ml  Output 200 ml  Net 2452.52 ml   Intake/Output: I/O last 3 completed shifts: In: 3207.5 [P.O.:360; I.V.:2532.5; Blood:315] Out: -   Intake/Output this shift:  Total I/O In: 120 [P.O.:120] Out: 200 [Urine:200] Weight change:  Gen: NAD CVS: no rub Resp: cta Abd: benign Ext: s/p LBKA, right leg 1+ pretibial edema  Recent Labs  Lab 03/28/18 2230 03/29/18 2338 03/30/18 1141 03/31/18 0455  NA 140 140 140 139  K 5.1 6.0* 5.7* 5.0  CL 108 108 111 110  CO2 23 23 24 22   GLUCOSE 137* 161* 99 131*  BUN 72* 73* 72* 71*  CREATININE 3.71* 3.75* 3.57* 3.10*  CALCIUM 7.9* 7.9* 8.1* 7.9*   Liver Function Tests: No results for input(s): AST, ALT, ALKPHOS, BILITOT, PROT, ALBUMIN in the last 168 hours. No results for input(s): LIPASE, AMYLASE in the last 168 hours. No results for input(s): AMMONIA in the last 168 hours. CBC: Recent Labs  Lab 03/28/18 2230 03/29/18 2338 03/30/18 1141  WBC 5.9 7.5 6.8  NEUTROABS 3.4  --   --   HGB 7.0* 9.8* 10.5*  HCT 22.6* 31.0* 33.5*  MCV 97.8 93.9 94.6  PLT 95* 113* 124*   Cardiac Enzymes: Recent Labs  Lab 03/29/18 1335  TROPONINI <0.03   CBG: Recent Labs  Lab 03/25/18 0617 03/25/18 1112  GLUCAP 111* 117*    Iron Studies: No results for input(s): IRON, TIBC, TRANSFERRIN, FERRITIN in the last 72 hours. Studies/Results: No results found. Marland Kitchen aspirin EC  81 mg Oral BID  . atorvastatin  40 mg Oral Daily  . carvedilol  12.5 mg Oral BID WC  . docusate sodium  100 mg Oral BID  . gabapentin  400 mg Oral TID  . latanoprost  1 drop Both Eyes QHS  . patiromer  16.8 g Oral Daily    BMET    Component Value Date/Time   NA 139 03/31/2018 0455   NA 143 08/28/2016 0916   K 5.0  03/31/2018 0455   CL 110 03/31/2018 0455   CO2 22 03/31/2018 0455   GLUCOSE 131 (H) 03/31/2018 0455   BUN 71 (H) 03/31/2018 0455   BUN 37 (H) 08/28/2016 0916   CREATININE 3.10 (H) 03/31/2018 0455   CREATININE 2.26 (H) 02/09/2013 1341   CALCIUM 7.9 (L) 03/31/2018 0455   CALCIUM 9.3 08/28/2011 1648   GFRNONAA 15 (L) 03/31/2018 0455   GFRNONAA 29 (L) 11/10/2012 1115   GFRAA 17 (L) 03/31/2018 0455   GFRAA 33 (L) 11/10/2012 1115   CBC    Component Value Date/Time   WBC 6.8 03/30/2018 1141   RBC 3.54 (L) 03/30/2018 1141   HGB 10.5 (L) 03/30/2018 1141   HCT 33.5 (L) 03/30/2018 1141   PLT 124 (L) 03/30/2018 1141   MCV 94.6 03/30/2018 1141   MCH 29.7 03/30/2018 1141   MCHC 31.3 03/30/2018 1141   RDW 14.6 03/30/2018 1141   LYMPHSABS 1.5 03/28/2018 2230   MONOABS 0.6 03/28/2018 2230   EOSABS 0.4 03/28/2018 2230   BASOSABS 0.0 03/28/2018 2230     Assessment/Plan: 1.  AKI/CKD stage 4 presumably hemodynamically mediated ischemic ATN due to relative hypotension, ABLA, and concomitant ACE-inhibition and COX-II  Inhbitor.  She was given IVF's with some improvement of Cr and UOP over the last 48 hours.   1. Discontinued lisinopril (last dose 03/30/18) as well as celebrex 2. Continue with IVF's but lower rate and follow renal function.   3. Avoid NSAIDs/COX-II I's/ACE-I/ARB's/IV contrast.   4. No uremic symptoms or indication for dialysis at this time.  2. Hyperkalemia- improved with medical management 3. ABLA- s/p blood transfusion with 2 units PRBC's.  Follow H/H 4. HTN- improved with stopping meds and IVF's.  Continue to follow 5. S/p Left reverse shoulder arthroplasty 6. Disposition- hopeful discharge in the next 24-48 hours if renal function continues to improve and f/u with Dr. Lorrene Reid after dc.   Donetta Potts, MD [employer] [phone]

## 2018-03-31 NOTE — Progress Notes (Signed)
[facility] PROGRESS NOTE  [name] [name]:[id number] DOB: 02/05/1951 DOA: 03/25/2018 PCP: Isabelle Course, MD   Narrative: 67 year old diabetes mellitus type 2 for many years PVD with history of left BKA, AICD, CAD, CKD 3-4, nonobstructive CAD, chronic pain admitted by orthopedics for elective left shoulder posterior dislocation Hospitalist service consulted secondary to rising creatinine and hyperkalemia   A & Plan Acute kidney injury in a setting of CKD 3-4-appreciate nephrology input-she was on a Cox 2 in addition to ACE inhibitor-these have been discontinued creatinine is slowly trending and is now down to 3, her hyperkalemia is resolving to 5  she is passing good urine not really being captured-we will continue normal saline 75 cc/h as well as [place] 16.8 as per nephrology input. If her creatinine continues to trend down and she continues to pass urine it is reasonable for her to discharge to skilled facility with close nephrology patient follow-up  Diabetes mellitus type 2 with nephropathy and prior BKA-would carefully dose her gabapentin depending on renal function and probably cut back the same prior to discharge as this is renally cleared. well-controlled 99-1 31  CAD nonobstructive-AICD 2/2 sinus node dysfunction- StJude-working well-continue Coreg hold lisinopril forever, hold ACE and Cox 2 and do not discharge on Lasix  Continued tobacco abuse        Note this patient is a patient of the teaching service I will let them know to follow-up if needed a.m. contact them directly if needed otherwise patient can probably be discharged if creatinine and potassium are below today's values  DVT prophylaxis: SCD  Code Status: full   Family Communication: none   Disposition Plan: ip pending clinical resolution    Verlon Au, MD  [facility] Direct contact: [phone] --Via amion app OR  --[email or web address]; password [email or web address]  7PM-7AM contact night coverage as  above 03/31/2018, 12:48 PM  LOS: 6 days   Consultants:  Nephro  Procedures:  none  Antimicrobials:  none  Interval history/Subjective: Awake alert pleasant eating well sitting watching TV no pain no fever no chills  Objective:  Vitals:  Vitals:   03/30/18 1452 03/30/18 1928  BP: (!) 156/86 (!) 103/57  Pulse: 63 (!) 59  Resp: 20 16  Temp: 98.7 F (37.1 C) 98.4 F (36.9 C)  SpO2: 100% 100%    Exam:  Pleasant oriented arcus senilis no icterus no pallor S1-S2 no murmur Multiple tattoos Neck is soft supple Chest is clear Abdomen is soft without any tenderness no lower extremity edema right leg left leg BKA noted Power 5/5 bilaterally-short-term memory seems intact without confabulation  I have personally reviewed the following:   Labs:  Creatinine down 73/3.7 271/3.1  Potassium 6.0-5.7-->5.0  Imaging studies:  None  Medical tests:  Reviewed  Test discussed with performing physician:  Yes discussed personally with Dr. Marlou Sa some of the plan prior to me realizing patient was a medicine patient  Decision to obtain old records:  Yes  Review and summation of old records:  Yes obtained  Scheduled Meds: . aspirin EC  81 mg Oral BID  . atorvastatin  40 mg Oral Daily  . carvedilol  12.5 mg Oral BID WC  . docusate sodium  100 mg Oral BID  . gabapentin  400 mg Oral TID  . latanoprost  1 drop Both Eyes QHS  . patiromer  16.8 g Oral Daily   Continuous Infusions: . sodium chloride 75 mL/hr at 03/31/18 0637  . methocarbamol (ROBAXIN)  IV      Principal  Problem:   Aftercare following left shoulder joint replacement surgery Active Problems:   Essential hypertension   Type 2 diabetes mellitus with diabetic chronic kidney disease ([place])   CKD stage 4 due to type 2 diabetes mellitus ([place])   Cognitive impairment   LOS: 6 days

## 2018-03-31 NOTE — Progress Notes (Signed)
Subjective: Patient stable.  Feels better today.   Objective: Vital signs in last 24 hours: Temp:  [98.4 F (36.9 C)-98.7 F (37.1 C)] 98.4 F (36.9 C) (07/02 1928) Pulse Rate:  [59-63] 59 (07/02 1928) Resp:  [16-20] 16 (07/02 1928) BP: (103-156)/(57-86) 103/57 (07/02 1928) SpO2:  [100 %] 100 % (07/02 1928)  Intake/Output from previous day: 07/02 0701 - 07/03 0700 In: 2892.5 [P.O.:360; I.V.:2532.5] Out: -  Intake/Output this shift: Total I/O In: 120 [P.O.:120] Out: -   Exam:  Sensation intact distally Intact pulses distally  Labs: Recent Labs    03/28/18 2230 03/29/18 2338 03/30/18 1141  HGB 7.0* 9.8* 10.5*   Recent Labs    03/29/18 2338 03/30/18 1141  WBC 7.5 6.8  RBC 3.30* 3.54*  HCT 31.0* 33.5*  PLT 113* 124*   Recent Labs    03/30/18 1141 03/31/18 0455  NA 140 139  K 5.7* 5.0  CL 111 110  CO2 24 22  BUN 72* 71*  CREATININE 3.57* 3.10*  GLUCOSE 99 131*  CALCIUM 8.1* 7.9*   No results for input(s): LABPT, INR in the last 72 hours.  Assessment/Plan: Plan at this time is to transfer to skilled nursing facility per therapy recommendations.  I think she will be available and medically clear to go tomorrow per medical consultants.   Landry Dyke Exavier Lina 03/31/2018, 11:43 AM

## 2018-03-31 NOTE — Progress Notes (Signed)
Physical Therapy Treatment Patient Details Name: [name] [name] MRN: [id number] DOB: 04-18-1951 Today's Date: 03/31/2018    History of Present Illness Pt is a 67 y/o female s/p L reverse shoulder replacement. PMH including but not limited to L transtibial amputation in 2014, CAD, CKD, DM, HTN, PVD and pacemaker placement in 2011.    PT Comments    POD # 6 L TSR Had pt demonstrate all mobility simulating her 2 room apartment.  Pt was able to self assist from bed to her personal wc then propel self to bathroom without assist.  Pt self performed toilet transfers using good safety safety tech and use of R UE/R LE.  Pt stated she only wears her prosthesis in community (church/MD office) and was able to amb short distances with a walker.  Re Educated her with TSR she won't be able to use a walker.   Pt declines SNF and plans to D/C back home with her Aide which she stated was 7 days a week for 3 hours.  Pt will need HH PT and has all equipment.  No stairs.    Follow Up Recommendations  SNF;Supervision for mobility/OOB(LPT rec SNF however pt declines "I'm going home" )  will need HH PT if D/C to home   Equipment Recommendations  None recommended by PT(pt has all equipment )    Recommendations for Other Services       Precautions / Restrictions Precautions Precautions: Shoulder Type of Shoulder Precautions: PROM FF to 90, abd to 60, ER to 30 and AROM elbow to hand Shoulder Interventions: Shoulder sling/immobilizer;Off for dressing/bathing/exercises Precaution Comments: previous L BKA/prosthesis at home  Required Braces or Orthoses: Sling Restrictions Weight Bearing Restrictions: Yes LUE Weight Bearing: Non weight bearing Other Position/Activity Restrictions: pt aware     Mobility  Bed Mobility Overal bed mobility: Modified Independent             General bed mobility comments: pt able to self perform  Transfers Overall transfer level: Needs assistance Equipment used:  None Transfers: Sit to/from Stand Sit to Stand: Modified independent (Device/Increase time);Supervision Stand pivot transfers: Modified independent (Device/Increase time);Supervision       General transfer comment: good safety tech and awareness.  Good use of R UE hand placement.  Pt able to self transfer on/off bed to personal wc and able to transfer on/off toilet   Ambulation/Gait             General Gait Details: pt uses her personal wc for mobility   Theme park manager mobility: Yes Wheelchair propulsion: Right upper extremity;Right lower extremity Wheelchair parts: Supervision/cueing Distance: 49 Wheelchair Assistance Details (indicate cue type and reason): pt does well negociating around room, through doorways and in hallway  Modified Rankin (Stroke Patients Only)       Balance                                            Cognition Arousal/Alertness: Awake/alert Behavior During Therapy: WFL for tasks assessed/performed Overall Cognitive Status: Within Functional Limits for tasks assessed                                 General Comments: appears at prior level    aware of  current situation and able to share past activity level      Exercises      General Comments        Pertinent Vitals/Pain Pain Assessment: 0-10 Pain Score: 5  Pain Location: L shoulder Pain Descriptors / Indicators: Aching;Sore Pain Intervention(s): Monitored during session;Repositioned    Home Living                      Prior Function            PT Goals (current goals can now be found in the care plan section) Progress towards PT goals: Progressing toward goals    Frequency    Min 3X/week      PT Plan Current plan remains appropriate    Co-evaluation              AM-PAC PT "6 Clicks" Daily Activity  Outcome Measure  Difficulty turning over in bed (including  adjusting bedclothes, sheets and blankets)?: A Little Difficulty moving from lying on back to sitting on the side of the bed? : A Little Difficulty sitting down on and standing up from a chair with arms (e.g., wheelchair, bedside commode, etc,.)?: A Little Help needed moving to and from a bed to chair (including a wheelchair)?: A Little Help needed walking in hospital room?: A Little Help needed climbing 3-5 steps with a railing? : A Lot 6 Click Score: 17    End of Session Equipment Utilized During Treatment: Gait belt Activity Tolerance: Patient tolerated treatment well Patient left: in bed;with call bell/phone within reach   PT Visit Diagnosis: Other abnormalities of gait and mobility (R26.89)     Time: 1345-1410 PT Time Calculation (min) (ACUTE ONLY): 25 min  Charges:  $Therapeutic Activity: 23-37 mins                    G Codes:         Nathanial Rancher 03/31/2018, 3:50 PM

## 2018-04-01 DIAGNOSIS — I2510 Atherosclerotic heart disease of native coronary artery without angina pectoris: Secondary | ICD-10-CM

## 2018-04-01 DIAGNOSIS — N179 Acute kidney failure, unspecified: Secondary | ICD-10-CM

## 2018-04-01 DIAGNOSIS — F0390 Unspecified dementia without behavioral disturbance: Secondary | ICD-10-CM

## 2018-04-01 DIAGNOSIS — E1151 Type 2 diabetes mellitus with diabetic peripheral angiopathy without gangrene: Secondary | ICD-10-CM

## 2018-04-01 DIAGNOSIS — Z9889 Other specified postprocedural states: Secondary | ICD-10-CM

## 2018-04-01 DIAGNOSIS — E1122 Type 2 diabetes mellitus with diabetic chronic kidney disease: Secondary | ICD-10-CM

## 2018-04-01 DIAGNOSIS — E875 Hyperkalemia: Secondary | ICD-10-CM

## 2018-04-01 DIAGNOSIS — N184 Chronic kidney disease, stage 4 (severe): Secondary | ICD-10-CM

## 2018-04-01 DIAGNOSIS — D62 Acute posthemorrhagic anemia: Secondary | ICD-10-CM

## 2018-04-01 DIAGNOSIS — I129 Hypertensive chronic kidney disease with stage 1 through stage 4 chronic kidney disease, or unspecified chronic kidney disease: Secondary | ICD-10-CM

## 2018-04-01 LAB — RENAL FUNCTION PANEL
ALBUMIN: 2.3 g/dL — AB (ref 3.5–5.0)
Anion gap: 3 — ABNORMAL LOW (ref 5–15)
BUN: 64 mg/dL — AB (ref 8–23)
CALCIUM: 7.8 mg/dL — AB (ref 8.9–10.3)
CO2: 22 mmol/L (ref 22–32)
Chloride: 116 mmol/L — ABNORMAL HIGH (ref 98–111)
Creatinine, Ser: 2.68 mg/dL — ABNORMAL HIGH (ref 0.44–1.00)
GFR calc Af Amer: 20 mL/min — ABNORMAL LOW (ref >60)
GFR calc non Af Amer: 18 mL/min — ABNORMAL LOW (ref >60)
GLUCOSE: 89 mg/dL (ref 70–99)
PHOSPHORUS: 5.3 mg/dL — AB (ref 2.5–4.6)
Potassium: 4.6 mmol/L (ref 3.5–5.1)
SODIUM: 141 mmol/L (ref 135–145)

## 2018-04-01 LAB — CBC
HEMATOCRIT: 32.1 % — AB (ref 36.0–46.0)
Hemoglobin: 10.1 g/dL — ABNORMAL LOW (ref 12.0–15.0)
MCH: 29.7 pg (ref 26.0–34.0)
MCHC: 31.5 g/dL (ref 30.0–36.0)
MCV: 94.4 fL (ref 78.0–100.0)
PLATELETS: 136 10*3/uL — AB (ref 150–400)
RBC: 3.4 MIL/uL — ABNORMAL LOW (ref 3.87–5.11)
RDW: 14.1 % (ref 11.5–15.5)
WBC: 6.8 10*3/uL (ref 4.0–10.5)

## 2018-04-01 NOTE — Progress Notes (Addendum)
  Progress note:   [name] [name] is a 67 y/o F, with PMH of CAD, CKD IV, DM, PVD, and dementia, admitted at orthopedic service for elective left shoulder replacement surgery. Post op, she developed anemia, rise in Cr, hyperkalemia.   Subjective: Visited the patient. She feels ok. She got distracted by her cell phone twice when we were trying to ask her questions or examine her. Complains of some pain at her surgery site.  Objective:  Vital signs in last 24 hours: Vitals:   03/30/18 1928 03/31/18 1440 03/31/18 2143 04/01/18 0422  BP: (!) 103/57 (!) 143/48 (!) 137/52 (!) 169/65  Pulse: (!) 59 60 62 60  Resp: 16 18 18 18   Temp: 98.4 F (36.9 C) 98 F (36.7 C) 98.3 F (36.8 C) 98 F (36.7 C)  TempSrc: Oral Oral Oral Oral  SpO2: 100% 97% 99% 100%   Ph/E:  Sitting on the bed without any acute distress Cardiac: Nl S1S2, no murmur Lungs: Nl exansion. Has some diffuse mild wheezing Extremities: Has clean bandage at surgery site on  left shoulder. No leakage around the bandage.  Pulses are bilaterally normal. Mild edema at right leg is observed.   Assessment/Plan:  1) AKI: Had Hx of CKD 4, with baseline Cr around 2.4-2.9.  AKI happened in setting of post up hypotension, NSAID and COX 2 inhibitor use.  With IV fluid therapy and medication hold, her renal function improved. Current BUN:64 and CR:2.68. Nephrology is on board. She can be discharged from our stand point and wait for orthopedic for final discharge  -Continue holding NSIAD, COX 2 inhibitors, ACE inh, ARB and avoid IV contrast -Stop Lisinopril after discharge and follow up with Dr. Lorrene Reid -Follow up BMP   2) Hyperkalemia: Resolved. Today K: 4.6 Secondary to AKI.    3) DM: Glucose 89. Last Hb A1c : 5.5 -Outpatient follow up   4) HTN: Stable. Continue follow up  5) Anemia: Improved with 2 units of P.cell. Due to acute blood loss post surgery. Current Hb: 10.1.    Dispo: Anticipated discharge in approximately 1  day.  Dewayne Hatch, MD 04/01/2018, 11:51 AM Pager: @[email or web address] @

## 2018-04-01 NOTE — Progress Notes (Signed)
S: feels better O:BP (!) 169/65 (BP Location: Right Arm)   Pulse 60   Temp 98 F (36.7 C) (Oral)   Resp 18   SpO2 100%   Intake/Output Summary (Last 24 hours) at 04/01/2018 1125 Last data filed at 04/01/2018 0900 Gross per 24 hour  Intake 720 ml  Output -  Net 720 ml   Intake/Output: I/O last 3 completed shifts: In: 3132.5 [P.O.:600; I.V.:2532.5] Out: 200 [Urine:200]  Intake/Output this shift:  Total I/O In: 240 [P.O.:240] Out: -  Weight change:  Gen: NAD CVS: no rub Resp: cta Abd: benign Ext: trace edema of right leg, s/p LBKA  Recent Labs  Lab 03/28/18 2230 03/29/18 2338 03/30/18 1141 03/31/18 0455 04/01/18 0510  NA 140 140 140 139 141  K 5.1 6.0* 5.7* 5.0 4.6  CL 108 108 111 110 116*  CO2 23 23 24 22 22   GLUCOSE 137* 161* 99 131* 89  BUN 72* 73* 72* 71* 64*  CREATININE 3.71* 3.75* 3.57* 3.10* 2.68*  ALBUMIN  --   --   --   --  2.3*  CALCIUM 7.9* 7.9* 8.1* 7.9* 7.8*  PHOS  --   --   --   --  5.3*   Liver Function Tests: Recent Labs  Lab 04/01/18 0510  ALBUMIN 2.3*   No results for input(s): LIPASE, AMYLASE in the last 168 hours. No results for input(s): AMMONIA in the last 168 hours. CBC: Recent Labs  Lab 03/28/18 2230 03/29/18 2338 03/30/18 1141 04/01/18 0510  WBC 5.9 7.5 6.8 6.8  NEUTROABS 3.4  --   --   --   HGB 7.0* 9.8* 10.5* 10.1*  HCT 22.6* 31.0* 33.5* 32.1*  MCV 97.8 93.9 94.6 94.4  PLT 95* 113* 124* 136*   Cardiac Enzymes: Recent Labs  Lab 03/29/18 1335  TROPONINI <0.03   CBG: No results for input(s): GLUCAP in the last 168 hours.  Iron Studies: No results for input(s): IRON, TIBC, TRANSFERRIN, FERRITIN in the last 72 hours. Studies/Results: No results found. Marland Kitchen aspirin EC  81 mg Oral BID  . atorvastatin  40 mg Oral Daily  . carvedilol  12.5 mg Oral BID WC  . docusate sodium  100 mg Oral BID  . gabapentin  400 mg Oral TID  . latanoprost  1 drop Both Eyes QHS  . patiromer  16.8 g Oral Daily    BMET    Component Value  Date/Time   NA 141 04/01/2018 0510   NA 143 08/28/2016 0916   K 4.6 04/01/2018 0510   CL 116 (H) 04/01/2018 0510   CO2 22 04/01/2018 0510   GLUCOSE 89 04/01/2018 0510   BUN 64 (H) 04/01/2018 0510   BUN 37 (H) 08/28/2016 0916   CREATININE 2.68 (H) 04/01/2018 0510   CREATININE 2.26 (H) 02/09/2013 1341   CALCIUM 7.8 (L) 04/01/2018 0510   CALCIUM 9.3 08/28/2011 1648   GFRNONAA 18 (L) 04/01/2018 0510   GFRNONAA 29 (L) 11/10/2012 1115   GFRAA 20 (L) 04/01/2018 0510   GFRAA 33 (L) 11/10/2012 1115   CBC    Component Value Date/Time   WBC 6.8 04/01/2018 0510   RBC 3.40 (L) 04/01/2018 0510   HGB 10.1 (L) 04/01/2018 0510   HCT 32.1 (L) 04/01/2018 0510   PLT 136 (L) 04/01/2018 0510   MCV 94.4 04/01/2018 0510   MCH 29.7 04/01/2018 0510   MCHC 31.5 04/01/2018 0510   RDW 14.1 04/01/2018 0510   LYMPHSABS 1.5 03/28/2018 2230   MONOABS  0.6 03/28/2018 2230   EOSABS 0.4 03/28/2018 2230   BASOSABS 0.0 03/28/2018 2230    Assessment/Plan: 1. AKI/CKD stage 4 presumably hemodynamically mediated ischemic ATN due to relative hypotension, ABLA, and concomitant ACE-inhibition and COX-II Inhbitor. She was given IVF's with some improvement of Cr and UOP over the last 48 hours.  1. Discontinued lisinopril (last dose 03/30/18) as well as celebrex 2. Continue with IVF's but lower rate and follow renal function.  3. Avoid NSAIDs/COX-II I's/ACE-I/ARB's/IV contrast.  4. Ok to stop IVF's as cr is now back at baseline 5. Do not resume lisinopril until she is seen by Dr. Lorrene Reid in follow up 6. Stable for discharge from renal standpoint and will sign off.  Please call with any questions or concerns. 2. Hyperkalemia- improved with medical management 3. ABLA- s/p blood transfusion with 2 units PRBC's. Follow H/H 4. HTN- improved with stopping meds and IVF's. Continue to follow 5. S/p Left reverse shoulder arthroplasty 6. Disposition- hopeful discharge in the next 24-48 hours if renal function continues to  improve and f/u with Dr. Lorrene Reid after dc.   Donetta Potts, MD [employer] [phone]

## 2018-04-01 NOTE — Progress Notes (Signed)
Subjective: Pt stable - pain ok   Objective: Vital signs in last 24 hours: Temp:  [98 F (36.7 C)-98.3 F (36.8 C)] 98 F (36.7 C) (07/04 0422) Pulse Rate:  [60-62] 60 (07/04 0422) Resp:  [18] 18 (07/04 0422) BP: (137-169)/(48-65) 169/65 (07/04 0422) SpO2:  [97 %-100 %] 100 % (07/04 0422)  Intake/Output from previous day: 07/03 0701 - 07/04 0700 In: 600 [P.O.:600] Out: 200 [Urine:200] Intake/Output this shift: Total I/O In: 240 [P.O.:240] Out: -   Exam:  No cellulitis present Compartment soft  Labs: Recent Labs    03/29/18 2338 03/30/18 1141 04/01/18 0510  HGB 9.8* 10.5* 10.1*   Recent Labs    03/30/18 1141 04/01/18 0510  WBC 6.8 6.8  RBC 3.54* 3.40*  HCT 33.5* 32.1*  PLT 124* 136*   Recent Labs    03/31/18 0455 04/01/18 0510  NA 139 141  K 5.0 4.6  CL 110 116*  CO2 22 22  BUN 71* 64*  CREATININE 3.10* 2.68*  GLUCOSE 131* 89  CALCIUM 7.9* 7.8*   No results for input(s): LABPT, INR in the last 72 hours.  Assessment/Plan: Plan dc am - labs trending better  -dc to home with hhpt   G Alphonzo Severance 04/01/2018, 10:33 AM

## 2018-04-02 ENCOUNTER — Telehealth: Payer: Self-pay | Admitting: Cardiology

## 2018-04-02 ENCOUNTER — Ambulatory Visit (INDEPENDENT_AMBULATORY_CARE_PROVIDER_SITE_OTHER): Payer: Medicaid Other | Admitting: *Deleted

## 2018-04-02 DIAGNOSIS — I442 Atrioventricular block, complete: Secondary | ICD-10-CM | POA: Diagnosis not present

## 2018-04-02 LAB — RENAL FUNCTION PANEL
Albumin: 2.4 g/dL — ABNORMAL LOW (ref 3.5–5.0)
Anion gap: 8 (ref 5–15)
BUN: 54 mg/dL — AB (ref 8–23)
CHLORIDE: 112 mmol/L — AB (ref 98–111)
CO2: 21 mmol/L — AB (ref 22–32)
Calcium: 8.2 mg/dL — ABNORMAL LOW (ref 8.9–10.3)
Creatinine, Ser: 2.39 mg/dL — ABNORMAL HIGH (ref 0.44–1.00)
GFR calc Af Amer: 23 mL/min — ABNORMAL LOW (ref >60)
GFR, EST NON AFRICAN AMERICAN: 20 mL/min — AB (ref >60)
Glucose, Bld: 129 mg/dL — ABNORMAL HIGH (ref 70–99)
Phosphorus: 5 mg/dL — ABNORMAL HIGH (ref 2.5–4.6)
Potassium: 4.2 mmol/L (ref 3.5–5.1)
Sodium: 141 mmol/L (ref 135–145)

## 2018-04-02 MED ORDER — ASPIRIN 81 MG PO TBEC: 81.0000 mg | DELAYED_RELEASE_TABLET | Freq: Two times a day (BID) | ORAL | 0 refills | Status: DC

## 2018-04-02 MED ORDER — ACETAMINOPHEN 325 MG PO TABS: 325.0000 mg | ORAL_TABLET | Freq: Four times a day (QID) | ORAL | 0 refills | Status: DC | PRN

## 2018-04-02 MED ORDER — METHOCARBAMOL 500 MG PO TABS: 500.0000 mg | ORAL_TABLET | Freq: Four times a day (QID) | ORAL | 0 refills | Status: DC | PRN

## 2018-04-02 MED ORDER — OXYCODONE HCL 5 MG PO TABS: 5.0000 mg | ORAL_TABLET | ORAL | 0 refills | Status: DC | PRN

## 2018-04-02 NOTE — Progress Notes (Signed)
Pt is anxious to go home. Discharge instructions given to pt but pt is rushing and does not pay attention while discharge instructions is given. All belongings were packed and given to pt. CPM for LUE packed and given to pt. Discharged to home, was picked up by a public transportation [place].

## 2018-04-02 NOTE — Progress Notes (Signed)
Occupational Therapy Treatment Patient Details Name: [name] [name] MRN: [id number] DOB: 06-29-1951 Today's Date: 04/02/2018    History of present illness Pt is a 67 y/o female s/p L reverse shoulder replacement. PMH including but not limited to L transtibial amputation in 2014, CAD, CKD, DM, HTN, PVD and pacemaker placement in 2011.   OT comments  Patient sitting in wheelchair packing up belonging with NT assistance as therapist enters room.  Session focus on precaution understanding and recall prior to dc home.  Patient able to verbalize sling wearing schedule and CPM schedule, but requires cueing to recall NWB status to L UE (reports 50%), no AROM to L UE (reports allowed AROM FF 90) therapist reinforced movement precautions SROM of L UE using RUE ONLY FF to 90, abd to 60 and ER to 30, and distal AROM (elbow, hand, wrist) as she continues to report completing shoulder AROM "circles" on L UE.  Continue to recommend 24/7 assistance at discharge, concerned for adherence to L UE precautions.    Follow Up Recommendations  Follow surgeon's recommendation for DC plan and follow-up therapies    Equipment Recommendations  None recommended by OT    Recommendations for Other Services      Precautions / Restrictions Precautions Precautions: Shoulder Type of Shoulder Precautions: PROM FF to 90, abd to 60, ER to 30 and AROM elbow to hand Shoulder Interventions: Shoulder sling/immobilizer;Off for dressing/bathing/exercises Precaution Booklet Issued: No(handout provided in previous session) Precaution Comments: previous L BKA/prosthesis at home  Required Braces or Orthoses: Sling Restrictions Weight Bearing Restrictions: Yes LUE Weight Bearing: Non weight bearing       Mobility Bed Mobility               General bed mobility comments: seated in wc upon entry  Transfers                      Balance                                           ADL either  performed or assessed with clinical judgement   ADL                                       Functional mobility during ADLs: Wheelchair;Modified independent General ADL Comments: Patient seated in wheelchair packing up items for dc home today.      Vision       Perception     Praxis      Cognition Arousal/Alertness: Awake/alert Behavior During Therapy: WFL for tasks assessed/performed Overall Cognitive Status: Impaired/Different from baseline Area of Impairment: Memory;Safety/judgement                     Memory: Decreased short-term memory;Decreased recall of precautions   Safety/Judgement: Decreased awareness of safety     General Comments: continues to require cueing to adhere to L shoulder precautions         Exercises     Shoulder Instructions       General Comments      Pertinent Vitals/ Pain       Pain Assessment: Faces Faces Pain Scale: Hurts a little bit Pain Location: L shoulder Pain Descriptors / Indicators: Aching;Sore Pain Intervention(s): Monitored during session  Home Living  Prior Functioning/Environment              Frequency  Min 3X/week        Progress Toward Goals  OT Goals(current goals can now be found in the care plan section)  Progress towards OT goals: Progressing toward goals  Acute Rehab OT Goals Patient Stated Goal: to go home OT Goal Formulation: With patient Time For Goal Achievement: 04/08/18 Potential to Achieve Goals: Good  Plan Discharge plan remains appropriate;Frequency remains appropriate    Co-evaluation                 AM-PAC PT "6 Clicks" Daily Activity     Outcome Measure   Help from another person eating meals?: A Little Help from another person taking care of personal grooming?: A Little Help from another person toileting, which includes using toliet, bedpan, or urinal?: A Little Help from another person  bathing (including washing, rinsing, drying)?: A Little Help from another person to put on and taking off regular upper body clothing?: A Little Help from another person to put on and taking off regular lower body clothing?: A Little 6 Click Score: 18    End of Session Equipment Utilized During Treatment: Other (comment)(sling)  OT Visit Diagnosis: Unsteadiness on feet (R26.81);Muscle weakness (generalized) (M62.81);Pain;Other symptoms and signs involving cognitive function Pain - Right/Left: Left Pain - part of body: Shoulder   Activity Tolerance Patient tolerated treatment well   Patient Left (pt dcing, rolling down hallway with NT)   Nurse Communication          Time: 5747-3403 OT Time Calculation (min): 8 min  Charges: OT General Charges $OT Visit: 1 Visit OT Treatments $Self Care/Home Management : 8-22 mins  Delight Stare, OTR/L  Pager [place] 04/02/2018, 12:16 PM

## 2018-04-02 NOTE — Progress Notes (Signed)
No transmission  

## 2018-04-02 NOTE — Telephone Encounter (Signed)
Spoke with pt and reminded pt of remote transmission that is due today. Pt verbalized understanding.   

## 2018-04-02 NOTE — Progress Notes (Signed)
Subjective: Pt stable - pain ok   Objective: Vital signs in last 24 hours: Temp:  [97.8 F (36.6 C)-97.9 F (36.6 C)] 97.8 F (36.6 C) (07/05 0450) Pulse Rate:  [63-66] 64 (07/05 0450) Resp:  [16-17] 17 (07/05 0450) BP: (145-162)/(49-71) 146/55 (07/05 0450) SpO2:  [100 %] 100 % (07/05 0450)  Intake/Output from previous day: 07/04 0701 - 07/05 0700 In: 2570 [P.O.:1380; I.V.:1190] Out: -  Intake/Output this shift: No intake/output data recorded.  Exam:  No cellulitis present  Labs: Recent Labs    03/30/18 1141 04/01/18 0510  HGB 10.5* 10.1*   Recent Labs    03/30/18 1141 04/01/18 0510  WBC 6.8 6.8  RBC 3.54* 3.40*  HCT 33.5* 32.1*  PLT 124* 136*   Recent Labs    04/01/18 0510 04/02/18 0456  NA 141 141  K 4.6 4.2  CL 116* 112*  CO2 22 21*  BUN 64* 54*  CREATININE 2.68* 2.39*  GLUCOSE 89 129*  CALCIUM 7.8* 8.2*   No results for input(s): LABPT, INR in the last 72 hours.  Assessment/Plan: Plan dc to home today with hhpt - kidney fxn ok and improving   G Alphonzo Severance 04/02/2018, 8:29 AM

## 2018-04-03 DIAGNOSIS — I10 Essential (primary) hypertension: Secondary | ICD-10-CM | POA: Diagnosis not present

## 2018-04-04 DIAGNOSIS — I10 Essential (primary) hypertension: Secondary | ICD-10-CM | POA: Diagnosis not present

## 2018-04-05 ENCOUNTER — Ambulatory Visit: Payer: Medicaid Other

## 2018-04-05 DIAGNOSIS — I10 Essential (primary) hypertension: Secondary | ICD-10-CM | POA: Diagnosis not present

## 2018-04-05 NOTE — Progress Notes (Signed)
Remote pacemaker transmission.   

## 2018-04-05 NOTE — Discharge Summary (Signed)
Physician Discharge Summary  Patient ID: [name] [name] MRN: [id number] DOB/AGE: 02-20-1951 67 y.o.  Admit date: 03/25/2018 Discharge date: 04/02/2018  Admission Diagnoses:  Principal Problem:   Aftercare following left shoulder joint replacement surgery Active Problems:   Essential hypertension   Type 2 diabetes mellitus with diabetic chronic kidney disease ([place])   CKD stage 4 due to type 2 diabetes mellitus ([place])   Cognitive impairment   Discharge Diagnoses:  Same  Surgeries: Procedure(s): LEFT REVERSE SHOULDER ARTHROPLASTY on 03/25/2018   Consultants: Treatment Team:  Donato Heinz, MD Oval Linsey, MD  Discharged Condition: [facility] Course: [name] [name] is an 67 y.o. 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  They were brought to the operating room on 03/25/2018 and underwent the above named procedures.  Patient tolerated her procedure well.  She did develop acute tubular necrosis affecting her kidney function.  This was managed with medical consultation and renal consultation.  It did improve by the time of discharge.  She is discharged home in good condition with pain controlled.  She has a brace which will facilitate passive range of motion of that left arm.  She will follow-up with me in 1 week.  Home health physical therapy will continue in order to continue to mobilize her shoulder.  Antibiotics given:  Anti-infectives (From admission, onward)   Start     Dose/Rate Route Frequency Ordered Stop   03/25/18 2200  vancomycin (VANCOCIN) IVPB 1000 mg/200 mL premix     1,000 mg 200 mL/hr over 60 Minutes Intravenous Every 12 hours 03/25/18 1219 03/25/18 2306   03/25/18 1001  vancomycin (VANCOCIN) powder  Status:  Discontinued       As needed 03/25/18 1002 03/25/18 1104   03/25/18 0600  vancomycin (VANCOCIN) IVPB 1000 mg/200 mL premix     1,000 mg 200 mL/hr over 60  Minutes Intravenous On call to O.R. 03/25/18 3818 03/25/18 2993    .  Recent vital signs:  Vitals:   04/01/18 2043 04/02/18 0450  BP: (!) 145/49 (!) 146/55  Pulse: 63 64  Resp: 17 17  Temp: 97.9 F (36.6 C) 97.8 F (36.6 C)  SpO2: 100% 100%    Recent laboratory studies:  Results for orders placed or performed during the hospital encounter of 03/25/18  Glucose, capillary  Result Value Ref Range   Glucose-Capillary 111 (H) 70 - 99 mg/dL  Glucose, capillary  Result Value Ref Range   Glucose-Capillary 117 (H) 70 - 99 mg/dL  CBC with Differential/Platelet  Result Value Ref Range   WBC 5.9 4.0 - 10.5 K/uL   RBC 2.31 (L) 3.87 - 5.11 MIL/uL   Hemoglobin 7.0 (L) 12.0 - 15.0 g/dL   HCT 22.6 (L) 36.0 - 46.0 %   MCV 97.8 78.0 - 100.0 fL   MCH 30.3 26.0 - 34.0 pg   MCHC 31.0 30.0 - 36.0 g/dL   RDW 13.3 11.5 - 15.5 %   Platelets 95 (L) 150 - 400 K/uL   Neutrophils Relative % 58 %   Neutro Abs 3.4 1.7 - 7.7 K/uL   Lymphocytes Relative 25 %   Lymphs Abs 1.5 0.7 - 4.0 K/uL   Monocytes Relative 10 %   Monocytes Absolute 0.6 0.1 - 1.0 K/uL   Eosinophils Relative 7 %   Eosinophils Absolute 0.4 0.0 - 0.7 K/uL   Basophils Relative 0 %   Basophils Absolute 0.0 0.0 -  0.1 K/uL   Immature Granulocytes 0 %   Abs Immature Granulocytes 0.0 0.0 - 0.1 K/uL  Basic metabolic panel  Result Value Ref Range   Sodium 140 135 - 145 mmol/L   Potassium 5.1 3.5 - 5.1 mmol/L   Chloride 108 98 - 111 mmol/L   CO2 23 22 - 32 mmol/L   Glucose, Bld 137 (H) 70 - 99 mg/dL   BUN 72 (H) 8 - 23 mg/dL   Creatinine, Ser 3.71 (H) 0.44 - 1.00 mg/dL   Calcium 7.9 (L) 8.9 - 10.3 mg/dL   GFR calc non Af Amer 12 (L) >60 mL/min   GFR calc Af Amer 14 (L) >60 mL/min   Anion gap 9 5 - 15  Troponin I  Result Value Ref Range   Troponin I <0.03 <0.03 ng/mL  Basic metabolic panel  Result Value Ref Range   Sodium 140 135 - 145 mmol/L   Potassium 6.0 (H) 3.5 - 5.1 mmol/L   Chloride 108 98 - 111 mmol/L   CO2 23 22 - 32  mmol/L   Glucose, Bld 161 (H) 70 - 99 mg/dL   BUN 73 (H) 8 - 23 mg/dL   Creatinine, Ser 3.75 (H) 0.44 - 1.00 mg/dL   Calcium 7.9 (L) 8.9 - 10.3 mg/dL   GFR calc non Af Amer 12 (L) >60 mL/min   GFR calc Af Amer 14 (L) >60 mL/min   Anion gap 9 5 - 15  CBC  Result Value Ref Range   WBC 7.5 4.0 - 10.5 K/uL   RBC 3.30 (L) 3.87 - 5.11 MIL/uL   Hemoglobin 9.8 (L) 12.0 - 15.0 g/dL   HCT 31.0 (L) 36.0 - 46.0 %   MCV 93.9 78.0 - 100.0 fL   MCH 29.7 26.0 - 34.0 pg   MCHC 31.6 30.0 - 36.0 g/dL   RDW 14.6 11.5 - 15.5 %   Platelets 113 (L) 150 - 400 K/uL  CBC  Result Value Ref Range   WBC 6.8 4.0 - 10.5 K/uL   RBC 3.54 (L) 3.87 - 5.11 MIL/uL   Hemoglobin 10.5 (L) 12.0 - 15.0 g/dL   HCT 33.5 (L) 36.0 - 46.0 %   MCV 94.6 78.0 - 100.0 fL   MCH 29.7 26.0 - 34.0 pg   MCHC 31.3 30.0 - 36.0 g/dL   RDW 14.6 11.5 - 15.5 %   Platelets 124 (L) 150 - 400 K/uL  Basic metabolic panel  Result Value Ref Range   Sodium 140 135 - 145 mmol/L   Potassium 5.7 (H) 3.5 - 5.1 mmol/L   Chloride 111 98 - 111 mmol/L   CO2 24 22 - 32 mmol/L   Glucose, Bld 99 70 - 99 mg/dL   BUN 72 (H) 8 - 23 mg/dL   Creatinine, Ser 3.57 (H) 0.44 - 1.00 mg/dL   Calcium 8.1 (L) 8.9 - 10.3 mg/dL   GFR calc non Af Amer 12 (L) >60 mL/min   GFR calc Af Amer 14 (L) >60 mL/min   Anion gap 5 5 - 15  Basic metabolic panel  Result Value Ref Range   Sodium 139 135 - 145 mmol/L   Potassium 5.0 3.5 - 5.1 mmol/L   Chloride 110 98 - 111 mmol/L   CO2 22 22 - 32 mmol/L   Glucose, Bld 131 (H) 70 - 99 mg/dL   BUN 71 (H) 8 - 23 mg/dL   Creatinine, Ser 3.10 (H) 0.44 - 1.00 mg/dL   Calcium 7.9 (L)  8.9 - 10.3 mg/dL   GFR calc non Af Amer 15 (L) >60 mL/min   GFR calc Af Amer 17 (L) >60 mL/min   Anion gap 7 5 - 15  Renal function panel  Result Value Ref Range   Sodium 141 135 - 145 mmol/L   Potassium 4.6 3.5 - 5.1 mmol/L   Chloride 116 (H) 98 - 111 mmol/L   CO2 22 22 - 32 mmol/L   Glucose, Bld 89 70 - 99 mg/dL   BUN 64 (H) 8 - 23 mg/dL    Creatinine, Ser 2.68 (H) 0.44 - 1.00 mg/dL   Calcium 7.8 (L) 8.9 - 10.3 mg/dL   Phosphorus 5.3 (H) 2.5 - 4.6 mg/dL   Albumin 2.3 (L) 3.5 - 5.0 g/dL   GFR calc non Af Amer 18 (L) >60 mL/min   GFR calc Af Amer 20 (L) >60 mL/min   Anion gap 3 (L) 5 - 15  CBC  Result Value Ref Range   WBC 6.8 4.0 - 10.5 K/uL   RBC 3.40 (L) 3.87 - 5.11 MIL/uL   Hemoglobin 10.1 (L) 12.0 - 15.0 g/dL   HCT 32.1 (L) 36.0 - 46.0 %   MCV 94.4 78.0 - 100.0 fL   MCH 29.7 26.0 - 34.0 pg   MCHC 31.5 30.0 - 36.0 g/dL   RDW 14.1 11.5 - 15.5 %   Platelets 136 (L) 150 - 400 K/uL  Renal function panel  Result Value Ref Range   Sodium 141 135 - 145 mmol/L   Potassium 4.2 3.5 - 5.1 mmol/L   Chloride 112 (H) 98 - 111 mmol/L   CO2 21 (L) 22 - 32 mmol/L   Glucose, Bld 129 (H) 70 - 99 mg/dL   BUN 54 (H) 8 - 23 mg/dL   Creatinine, Ser 2.39 (H) 0.44 - 1.00 mg/dL   Calcium 8.2 (L) 8.9 - 10.3 mg/dL   Phosphorus 5.0 (H) 2.5 - 4.6 mg/dL   Albumin 2.4 (L) 3.5 - 5.0 g/dL   GFR calc non Af Amer 20 (L) >60 mL/min   GFR calc Af Amer 23 (L) >60 mL/min   Anion gap 8 5 - 15  Type and screen [place]  Result Value Ref Range   ABO/RH(D) O POS    Antibody Screen NEG    Sample Expiration 04/01/2018    Unit Number [id number]    Blood Component Type RED CELLS,LR    Unit division 00    Status of Unit ISSUED,FINAL    Transfusion Status OK TO TRANSFUSE    Crossmatch Result      Compatible Performed at [facility], [address] [place], [place] [address]    Unit Number [id number]    Blood Component Type RED CELLS,LR    Unit division 00    Status of Unit ISSUED,FINAL    Transfusion Status OK TO TRANSFUSE    Crossmatch Result Compatible   Prepare RBC  Result Value Ref Range   Order Confirmation      ORDER PROCESSED BY BLOOD BANK Performed at [facility], [place] [address], [place], [place] [address]   BPAM [facility]  Result Value Ref Range   ISSUE DATE / TIME [id number]    Blood  Product Unit Number [id number]    PRODUCT CODE [id number]    Unit Type and Rh 5100    Blood Product Expiration Date [id number]    ISSUE DATE / TIME [id number]    Blood Product Unit Number  [id number]    PRODUCT CODE [id number]    Unit Type and Rh 5100    Blood Product Expiration Date [id number]     Discharge Medications:   Allergies as of 04/02/2018      Reactions   Penicillins Rash   Has patient had a PCN reaction causing immediate rash, facial/tongue/throat swelling, SOB or lightheadedness with hypotension: Yes Has patient had a PCN reaction causing severe rash involving mucus membranes or skin necrosis: No Has patient had a PCN reaction that required hospitalization: No Has patient had a PCN reaction occurring within the last 10 years: No If all of the above answers are "NO", then may proceed with Cephalosporin use.      Medication List    TAKE these medications   acetaminophen 325 MG tablet Commonly known as:  TYLENOL Take 1-2 tablets (325-650 mg total) by mouth every 6 (six) hours as needed for mild pain (pain score 1-3 or temp > 100.5). What changed:    how much to take  reasons to take this   aspirin 81 MG EC tablet Take 1 tablet (81 mg total) by mouth 2 (two) times daily.   atorvastatin 40 MG tablet Commonly known as:  LIPITOR Take 1 tablet (40 mg total) by mouth daily.   carvedilol 12.5 MG tablet Commonly known as:  COREG TAKE 1 TABLET BY MOUTH TWICE DAILY WITH FOOD   diphenoxylate-atropine 2.5-0.025 MG/5ML liquid Commonly known as:  LOMOTIL take 5 mls FOUR TIMES DAILY AS NEEDED   furosemide 40 MG tablet Commonly known as:  LASIX TAKE 1 TABLET BY MOUTH EVERY DAY   gabapentin 400 MG capsule Commonly known as:  NEURONTIN Take 400 mg by mouth 3 (three) times daily. What changed:  Another medication with the same name was removed. Continue taking this medication, and follow the directions you see here.   isosorbide-hydrALAZINE 20-37.5 MG  tablet Commonly known as:  BIDIL Take 1 tablet by mouth 3 (three) times daily.   lisinopril 10 MG tablet Commonly known as:  PRINIVIL,ZESTRIL TAKE 1 TABLET BY MOUTH EVERY DAY   methocarbamol 500 MG tablet Commonly known as:  ROBAXIN Take 1 tablet (500 mg total) by mouth every 6 (six) hours as needed for muscle spasms.   oxyCODONE 5 MG immediate release tablet Commonly known as:  Oxy IR/ROXICODONE Take 1 tablet (5 mg total) by mouth every 4 (four) hours as needed for moderate pain (pain score 4-6).   TRAVATAN Z 0.004 % Soln ophthalmic solution Generic drug:  Travoprost (BAK Free) Place 1 drop in each eye at bedtime       Diagnostic Studies: Dg Chest Port 1 View  Result Date: 03/29/2018 CLINICAL DATA:  Shortness of breath EXAM: PORTABLE CHEST 1 VIEW COMPARISON:  None. FINDINGS: Left chest wall pacemaker leads overlie the right atrium and right ventricle. Heart size is normal. No focal airspace consolidation or pulmonary edema. No pleural effusion or pneumothorax. IMPRESSION: No active disease. Electronically Signed   By: Ulyses Jarred M.D.   On: 03/29/2018 06:27    Disposition:   Discharge Instructions    Call MD / Call 911   Complete by:  As directed    If you experience chest pain or shortness of breath, CALL 911 and be transported to the hospital emergency room.  If you develope a fever above 101 F, pus (white drainage) or increased drainage or redness at the wound, or calf pain, call your surgeon's office.   Constipation Prevention   Complete by:  As directed    Drink plenty of fluids.  Prune juice may be helpful.  You may use a stool softener, such as Colace (over the counter) 100 mg twice a day.  Use MiraLax (over the counter) for constipation as needed.   Diet - low sodium heart healthy   Complete by:  As directed    Discharge instructions   Complete by:  As directed    No lifting with left arm Exercise arm daily - use brace 3 times a day for 1 hour   Increase activity  slowly as tolerated   Complete by:  As directed       Follow-up Information    [facility].   Specialty:  [place] Why:  A representative from [place] will contact you to arrange start date and time for your therapy.  Contact information: [address] [place] [address] [phone]            Signed: Anderson Malta 04/05/2018, 9:17 AM

## 2018-04-06 DIAGNOSIS — I10 Essential (primary) hypertension: Secondary | ICD-10-CM | POA: Diagnosis not present

## 2018-04-07 ENCOUNTER — Other Ambulatory Visit: Payer: Self-pay | Admitting: *Deleted

## 2018-04-07 ENCOUNTER — Telehealth (INDEPENDENT_AMBULATORY_CARE_PROVIDER_SITE_OTHER): Payer: Self-pay

## 2018-04-07 ENCOUNTER — Ambulatory Visit (INDEPENDENT_AMBULATORY_CARE_PROVIDER_SITE_OTHER): Payer: Medicaid Other

## 2018-04-07 ENCOUNTER — Ambulatory Visit (INDEPENDENT_AMBULATORY_CARE_PROVIDER_SITE_OTHER): Payer: Medicaid Other | Admitting: Orthopedic Surgery

## 2018-04-07 ENCOUNTER — Encounter: Payer: Self-pay | Admitting: Cardiology

## 2018-04-07 ENCOUNTER — Telehealth (INDEPENDENT_AMBULATORY_CARE_PROVIDER_SITE_OTHER): Payer: Self-pay | Admitting: Orthopedic Surgery

## 2018-04-07 ENCOUNTER — Encounter (INDEPENDENT_AMBULATORY_CARE_PROVIDER_SITE_OTHER): Payer: Self-pay | Admitting: Orthopedic Surgery

## 2018-04-07 DIAGNOSIS — I10 Essential (primary) hypertension: Secondary | ICD-10-CM | POA: Diagnosis not present

## 2018-04-07 DIAGNOSIS — Z96612 Presence of left artificial shoulder joint: Secondary | ICD-10-CM | POA: Diagnosis not present

## 2018-04-07 DIAGNOSIS — I429 Cardiomyopathy, unspecified: Secondary | ICD-10-CM

## 2018-04-07 DIAGNOSIS — Z7689 Persons encountering health services in other specified circumstances: Secondary | ICD-10-CM | POA: Diagnosis not present

## 2018-04-07 DIAGNOSIS — I25118 Atherosclerotic heart disease of native coronary artery with other forms of angina pectoris: Secondary | ICD-10-CM

## 2018-04-07 DIAGNOSIS — I739 Peripheral vascular disease, unspecified: Secondary | ICD-10-CM

## 2018-04-07 LAB — BASIC METABOLIC PANEL
BUN/Creatinine Ratio: 19 (calc) (ref 6–22)
BUN: 43 mg/dL — AB (ref 7–25)
CALCIUM: 8.8 mg/dL (ref 8.6–10.4)
CO2: 24 mmol/L (ref 20–32)
Chloride: 113 mmol/L — ABNORMAL HIGH (ref 98–110)
Creat: 2.26 mg/dL — ABNORMAL HIGH (ref 0.50–0.99)
Glucose, Bld: 141 mg/dL — ABNORMAL HIGH (ref 65–99)
POTASSIUM: 4.8 mmol/L (ref 3.5–5.3)
SODIUM: 145 mmol/L (ref 135–146)

## 2018-04-07 MED ORDER — GABAPENTIN 300 MG PO CAPS: 300.0000 mg | ORAL_CAPSULE | Freq: Three times a day (TID) | ORAL | 0 refills | Status: DC

## 2018-04-07 MED ORDER — ISOSORB DINITRATE-HYDRALAZINE 20-37.5 MG PO TABS: 1.0000 | ORAL_TABLET | Freq: Three times a day (TID) | ORAL | 3 refills | Status: DC

## 2018-04-07 MED ORDER — ATORVASTATIN CALCIUM 40 MG PO TABS: 40.0000 mg | ORAL_TABLET | Freq: Every day | ORAL | 3 refills | Status: DC

## 2018-04-07 MED ORDER — CARVEDILOL 12.5 MG PO TABS: 12.5000 mg | ORAL_TABLET | Freq: Two times a day (BID) | ORAL | 0 refills | Status: DC

## 2018-04-07 NOTE — Telephone Encounter (Signed)
Will make sure that Dr Marlou Sa is aware at time of appt

## 2018-04-07 NOTE — Telephone Encounter (Signed)
Per [name] Dr. Randel Pigg office will draw BMP today.  Attempted to reach patient to discuss med changes as [name] may not be able to get to patient's home today. Phone went directly to VM. Message left requesting return call. Per Dr Dareen Piano ok to keep appt with new PCP on 04/15/2018 unless patient needs to be seen earlier. Hubbard Hartshorn, RN, BSN

## 2018-04-07 NOTE — Telephone Encounter (Signed)
[name] [name]- P4, she verify with [facility] on [address] that the pt did pick up the medicine, these by Dr Forbes Cellar and picked 07/06

## 2018-04-07 NOTE — Telephone Encounter (Signed)
[name] with [facility] called concerning some Rx's that were prescribed for patient after her surgery.  Stated that Rx's may have went to the incorrect pharmacy.  Tried calling [name] back to clarify, but no answer.  Left a VM for her to call back.  [name]'s CB# is [phone].  Thank You.

## 2018-04-07 NOTE — Telephone Encounter (Signed)
[facility] called and stated that patient did in fact pick up medication on 04/03/18.  If any questions please call her @ [phone]  She talked with April earlier and this patient has an appt today @1 :00

## 2018-04-07 NOTE — Telephone Encounter (Signed)
Thank you.. Can we reschedule her appointment to early next week like Monday? We can do a BMP then as well instead of getting ortho to do it

## 2018-04-07 NOTE — Telephone Encounter (Signed)
Can you please reach out to [facility] to see if patient can get assistance with brace? She has not been using it at all because she said that no one has shown her how to use it.

## 2018-04-07 NOTE — Telephone Encounter (Signed)
Returned call to [name] made aware patient to stop lisinopril and lasix and take lower dose of gabapentin per Dr. Dareen Piano. Patient will need repeat BMP this week. Patient uses medicaid transpo and needs to give 3 days notice. [name] will see if Dr. Randel Pigg office is able draw BMP at today's visit. [name] will go to patient's house and explain med changes. Hubbard Hartshorn, RN, BSN

## 2018-04-07 NOTE — Telephone Encounter (Signed)
Talked with [name] [name] and advised her that Rx's were sent to [facility] on [address].  Stated that patient's PCP would like for Dr. Marlou Sa to order lab work for a BMP due to patient not being able to see PCP until next week and transportation issues.  Patient has not had HHPT with [facility] and has not used device that was delivered to her home.  [name] [name]'s Cb# is [phone].  Thank You.  Patient has appt.today at 1pm.

## 2018-04-07 NOTE — Telephone Encounter (Signed)
[name], RN from [facility] called in stating patient discharged from [facility] 04/02/2018 following shoulder surgery. Has not had any pain meds. Was given immobilizer that hooks to machine but no instructions on how to wear it so has not used it. [facility]  has not yet initiated PT either. [name] has contacted [facility] about this. Patient has f/u with Surgeon Marlou Sa) today. Acetaminophen, ASA, robaxin and oxycodone sent to [facility], however, patient uses [facility] as they deliver and patient has no transportation. [name] will contact his office to have these meds sent to [facility]. During med rec [name] realized patient was taking outdated meds. Has provided patient education on this. Will need all maintenance meds refilled at [facility]. Patient has appt to meet new PCP 04/15/2018. Hubbard Hartshorn, RN, BSN

## 2018-04-07 NOTE — Telephone Encounter (Signed)
BMET was drawn today as requested Spoke with Neoma Laming who is helping with patients care and she states that the machine was just sitting in patients apartment and no one from [facility] had been to patients home I advised that patient would not have been d/c from hospital without instruction for use of equipment from [place] Also advised I had reached out to [facility] and they advised the reason had not been out to see patient was b/c they had tried to reach patient and could not get an answer. They even tried contacting emergency contacts listed on patients chart and they were to advise patient that they were trying to reach them. [facility] states they will try again to reach patient to get out to her home to get her seen for HHPT Also trying to get in touch with Ruby Cola with [employer] to see if they can arrange someone to go to patients home to assist and show her how to use equipment again.

## 2018-04-08 ENCOUNTER — Telehealth: Payer: Self-pay | Admitting: Internal Medicine

## 2018-04-08 ENCOUNTER — Telehealth (INDEPENDENT_AMBULATORY_CARE_PROVIDER_SITE_OTHER): Payer: Self-pay | Admitting: Orthopedic Surgery

## 2018-04-08 DIAGNOSIS — I10 Essential (primary) hypertension: Secondary | ICD-10-CM | POA: Diagnosis not present

## 2018-04-08 NOTE — Telephone Encounter (Signed)
[name] [name] [facility] [phone]; she needs to speak a nurse

## 2018-04-08 NOTE — Telephone Encounter (Signed)
IC verbal given.  

## 2018-04-08 NOTE — Progress Notes (Signed)
Pls fw to primary care doc thx

## 2018-04-08 NOTE — Telephone Encounter (Signed)
Attempted to call, no answer.

## 2018-04-08 NOTE — Telephone Encounter (Signed)
Jerilyn at [facility] is requesting home health physical therapy orders  1 x for 1 week 3 x for 1 week 2 x for 3 weeks  CB # [phone]

## 2018-04-08 NOTE — Telephone Encounter (Signed)
BMP results drawn yesterday at Dr. Randel Pigg office are in [place]. Hubbard Hartshorn, RN, BSN

## 2018-04-09 ENCOUNTER — Telehealth: Payer: Self-pay | Admitting: *Deleted

## 2018-04-09 DIAGNOSIS — I10 Essential (primary) hypertension: Secondary | ICD-10-CM | POA: Diagnosis not present

## 2018-04-09 DIAGNOSIS — E1122 Type 2 diabetes mellitus with diabetic chronic kidney disease: Secondary | ICD-10-CM

## 2018-04-09 DIAGNOSIS — N184 Chronic kidney disease, stage 4 (severe): Principal | ICD-10-CM

## 2018-04-09 NOTE — Telephone Encounter (Signed)
rec'd a rtc from dorothy at [facility], she states pt was not called and advised of her medication changes, pt has continued her meds except possibly the furosemide. [name] states pt's meds are all "jumbled up" and she has multiple bottles of meds and some dating back to 2017. [name] is leaving for vacation and wanted to speak to [facility] about this before leaving, she could not recall which meds were to be stopped and which were not. Triage has suggested that possibly a HHN order for medication and disease management and pill boxes  might be appropriate at this time along with [place] c[facility] eval and treat. She also states that pt has a machine that was delevered to her home that she states medicaid paid for that is not being used by PT when they come, could ask HHN to look into this at her or his initial visit Sending to dr's narendra and vogel

## 2018-04-09 NOTE — Telephone Encounter (Signed)
Hello [name] [name]... Can you please ask the team that discharged her to put these orders in as we have not done a face to face with her

## 2018-04-10 DIAGNOSIS — I10 Essential (primary) hypertension: Secondary | ICD-10-CM | POA: Diagnosis not present

## 2018-04-11 ENCOUNTER — Encounter (INDEPENDENT_AMBULATORY_CARE_PROVIDER_SITE_OTHER): Payer: Self-pay | Admitting: Orthopedic Surgery

## 2018-04-11 DIAGNOSIS — I10 Essential (primary) hypertension: Secondary | ICD-10-CM | POA: Diagnosis not present
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
## 2018-04-12 ENCOUNTER — Ambulatory Visit: Payer: Medicaid Other | Admitting: Podiatry

## 2018-04-12 ENCOUNTER — Encounter: Payer: Self-pay | Admitting: Podiatry

## 2018-04-12 DIAGNOSIS — M79676 Pain in unspecified toe(s): Secondary | ICD-10-CM | POA: Diagnosis not present

## 2018-04-12 DIAGNOSIS — B351 Tinea unguium: Secondary | ICD-10-CM

## 2018-04-12 DIAGNOSIS — L989 Disorder of the skin and subcutaneous tissue, unspecified: Secondary | ICD-10-CM

## 2018-04-12 DIAGNOSIS — E0843 Diabetes mellitus due to underlying condition with diabetic autonomic (poly)neuropathy: Secondary | ICD-10-CM

## 2018-04-12 DIAGNOSIS — I10 Essential (primary) hypertension: Secondary | ICD-10-CM | POA: Diagnosis not present

## 2018-04-12 DIAGNOSIS — Z7689 Persons encountering health services in other specified circumstances: Secondary | ICD-10-CM | POA: Diagnosis not present

## 2018-04-12 DIAGNOSIS — Z89512 Acquired absence of left leg below knee: Secondary | ICD-10-CM

## 2018-04-13 DIAGNOSIS — I10 Essential (primary) hypertension: Secondary | ICD-10-CM | POA: Diagnosis not present

## 2018-04-14 ENCOUNTER — Other Ambulatory Visit: Payer: Self-pay | Admitting: Oncology

## 2018-04-14 DIAGNOSIS — I10 Essential (primary) hypertension: Secondary | ICD-10-CM | POA: Diagnosis not present

## 2018-04-14 NOTE — Telephone Encounter (Signed)
review 

## 2018-04-14 NOTE — Telephone Encounter (Signed)
This was addressed in separate phone encounter from 04/09/2018. Hubbard Hartshorn, RN, BSN

## 2018-04-15 ENCOUNTER — Other Ambulatory Visit: Payer: Self-pay

## 2018-04-15 ENCOUNTER — Encounter: Payer: Self-pay | Admitting: Internal Medicine

## 2018-04-15 ENCOUNTER — Ambulatory Visit: Payer: Medicaid Other | Admitting: Internal Medicine

## 2018-04-15 VITALS — BP 168/63 | HR 60 | Temp 99.3°F | Wt 129.0 lb

## 2018-04-15 DIAGNOSIS — I129 Hypertensive chronic kidney disease with stage 1 through stage 4 chronic kidney disease, or unspecified chronic kidney disease: Secondary | ICD-10-CM | POA: Diagnosis not present

## 2018-04-15 DIAGNOSIS — Z23 Encounter for immunization: Secondary | ICD-10-CM | POA: Diagnosis not present

## 2018-04-15 DIAGNOSIS — Z79899 Other long term (current) drug therapy: Secondary | ICD-10-CM

## 2018-04-15 DIAGNOSIS — I10 Essential (primary) hypertension: Secondary | ICD-10-CM | POA: Diagnosis not present

## 2018-04-15 DIAGNOSIS — N184 Chronic kidney disease, stage 4 (severe): Secondary | ICD-10-CM | POA: Diagnosis not present

## 2018-04-15 DIAGNOSIS — Z89612 Acquired absence of left leg above knee: Secondary | ICD-10-CM

## 2018-04-15 DIAGNOSIS — Z7689 Persons encountering health services in other specified circumstances: Secondary | ICD-10-CM | POA: Diagnosis not present

## 2018-04-15 DIAGNOSIS — Z0000 Encounter for general adult medical examination without abnormal findings: Secondary | ICD-10-CM

## 2018-04-15 DIAGNOSIS — E1122 Type 2 diabetes mellitus with diabetic chronic kidney disease: Secondary | ICD-10-CM

## 2018-04-15 DIAGNOSIS — Z96612 Presence of left artificial shoulder joint: Secondary | ICD-10-CM | POA: Diagnosis not present

## 2018-04-15 NOTE — Assessment & Plan Note (Signed)
Has mammogram scheduled for August 1.  PCV 13 given today.  Up-to-date on colonoscopy, due 2023.  Discussed bone density scan, patient declined.

## 2018-04-15 NOTE — Assessment & Plan Note (Addendum)
Chronic. Uncontrolled. 168/63 today.  Patient reports compliance with furosemide, carvedilol, lisinopril, isosorbide hydralazine and has home health nurse manage her pillbox.  Denies headache, vision changes, abdominal pain, changes in urination.  Given her stage IV CKD, I am hesitant to make changes without consulting her nephrologist.  Plan: -Send message to Dr. Lorrene Reid, nephrology.  My considerations would be increasing isosorbide hydralazine or add amlodipine.  Heart rate today is 60 so I would not consider increasing carvedilol.  I doubt her kidneys would tolerate an increase in lisinopril.
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
## 2018-04-15 NOTE — Patient Instructions (Signed)
Ms. Flammer,  It was nice to meet you today.  Thanks for coming in.  Her blood pressure has been a little high at the past couple times.  I am going to reach out to your kidney doctor and see if there is any changes that we can make.  I will call you if there are any changes.  Glad things are going well and you are staying on top of your health!  Please let me know if you need anything.  Thanks,  Dr. Donne Hazel

## 2018-04-16 DIAGNOSIS — N184 Chronic kidney disease, stage 4 (severe): Secondary | ICD-10-CM | POA: Diagnosis not present

## 2018-04-16 DIAGNOSIS — I10 Essential (primary) hypertension: Secondary | ICD-10-CM | POA: Diagnosis not present

## 2018-04-16 DIAGNOSIS — E1122 Type 2 diabetes mellitus with diabetic chronic kidney disease: Secondary | ICD-10-CM | POA: Diagnosis not present

## 2018-04-16 DIAGNOSIS — Z471 Aftercare following joint replacement surgery: Secondary | ICD-10-CM | POA: Diagnosis not present

## 2018-04-16 DIAGNOSIS — Z96612 Presence of left artificial shoulder joint: Secondary | ICD-10-CM | POA: Diagnosis not present

## 2018-04-16 DIAGNOSIS — I129 Hypertensive chronic kidney disease with stage 1 through stage 4 chronic kidney disease, or unspecified chronic kidney disease: Secondary | ICD-10-CM | POA: Diagnosis not present

## 2018-04-16 DIAGNOSIS — R419 Unspecified symptoms and signs involving cognitive functions and awareness: Secondary | ICD-10-CM | POA: Diagnosis not present
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
## 2018-04-16 NOTE — Progress Notes (Signed)
Internal Medicine Clinic Attending  I saw and evaluated the patient.  I personally confirmed the key portions of the history and exam documented by Dr. Donne Hazel and I reviewed pertinent patient test results.  The assessment, diagnosis, and plan were formulated together and I agree with the documentation in the resident's note.  Lenice Pressman, M.D., Ph.D.

## 2018-04-17 DIAGNOSIS — I10 Essential (primary) hypertension: Secondary | ICD-10-CM | POA: Diagnosis not present

## 2018-04-18 DIAGNOSIS — I10 Essential (primary) hypertension: Secondary | ICD-10-CM | POA: Diagnosis not present

## 2018-04-19 DIAGNOSIS — I10 Essential (primary) hypertension: Secondary | ICD-10-CM | POA: Diagnosis not present

## 2018-04-20 DIAGNOSIS — Z96612 Presence of left artificial shoulder joint: Secondary | ICD-10-CM | POA: Diagnosis not present

## 2018-04-20 DIAGNOSIS — I129 Hypertensive chronic kidney disease with stage 1 through stage 4 chronic kidney disease, or unspecified chronic kidney disease: Secondary | ICD-10-CM | POA: Diagnosis not present

## 2018-04-20 DIAGNOSIS — I10 Essential (primary) hypertension: Secondary | ICD-10-CM | POA: Diagnosis not present

## 2018-04-20 DIAGNOSIS — E1122 Type 2 diabetes mellitus with diabetic chronic kidney disease: Secondary | ICD-10-CM | POA: Diagnosis not present

## 2018-04-20 DIAGNOSIS — Z471 Aftercare following joint replacement surgery: Secondary | ICD-10-CM | POA: Diagnosis not present

## 2018-04-20 DIAGNOSIS — N184 Chronic kidney disease, stage 4 (severe): Secondary | ICD-10-CM | POA: Diagnosis not present

## 2018-04-20 DIAGNOSIS — R419 Unspecified symptoms and signs involving cognitive functions and awareness: Secondary | ICD-10-CM | POA: Diagnosis not present

## 2018-04-21 ENCOUNTER — Encounter (INDEPENDENT_AMBULATORY_CARE_PROVIDER_SITE_OTHER): Payer: Self-pay | Admitting: Orthopedic Surgery

## 2018-04-21 ENCOUNTER — Ambulatory Visit (INDEPENDENT_AMBULATORY_CARE_PROVIDER_SITE_OTHER): Payer: Medicaid Other | Admitting: Orthopedic Surgery

## 2018-04-21 DIAGNOSIS — Z96612 Presence of left artificial shoulder joint: Secondary | ICD-10-CM

## 2018-04-21 DIAGNOSIS — I10 Essential (primary) hypertension: Secondary | ICD-10-CM | POA: Diagnosis not present

## 2018-04-21 DIAGNOSIS — Z7689 Persons encountering health services in other specified circumstances: Secondary | ICD-10-CM | POA: Diagnosis not present

## 2018-04-21 NOTE — Telephone Encounter (Signed)
Saw dr Donne Hazel 7/18, dr vogel could you put [facility], [place] CSW order in for pt, [facility] to [facility] pt does not have HHN and needs help with meds and disease management also needs help from social work for [employer], possibly meals on wheels

## 2018-04-21 NOTE — Telephone Encounter (Signed)
Yes I will put those orders in! Thank you

## 2018-04-21 NOTE — Addendum Note (Signed)
Addended by: Isabelle Course on: 04/21/2018 11:44 AM   Modules accepted: Orders

## 2018-04-22 DIAGNOSIS — I10 Essential (primary) hypertension: Secondary | ICD-10-CM | POA: Diagnosis not present

## 2018-04-23 ENCOUNTER — Telehealth (INDEPENDENT_AMBULATORY_CARE_PROVIDER_SITE_OTHER): Payer: Self-pay | Admitting: Orthopedic Surgery

## 2018-04-23 ENCOUNTER — Encounter (INDEPENDENT_AMBULATORY_CARE_PROVIDER_SITE_OTHER): Payer: Self-pay | Admitting: Orthopedic Surgery

## 2018-04-23 DIAGNOSIS — E1122 Type 2 diabetes mellitus with diabetic chronic kidney disease: Secondary | ICD-10-CM | POA: Diagnosis not present

## 2018-04-23 DIAGNOSIS — N184 Chronic kidney disease, stage 4 (severe): Secondary | ICD-10-CM | POA: Diagnosis not present

## 2018-04-23 DIAGNOSIS — I129 Hypertensive chronic kidney disease with stage 1 through stage 4 chronic kidney disease, or unspecified chronic kidney disease: Secondary | ICD-10-CM | POA: Diagnosis not present

## 2018-04-23 DIAGNOSIS — Z96612 Presence of left artificial shoulder joint: Secondary | ICD-10-CM | POA: Diagnosis not present

## 2018-04-23 DIAGNOSIS — I10 Essential (primary) hypertension: Secondary | ICD-10-CM | POA: Diagnosis not present

## 2018-04-23 DIAGNOSIS — R419 Unspecified symptoms and signs involving cognitive functions and awareness: Secondary | ICD-10-CM | POA: Diagnosis not present

## 2018-04-23 DIAGNOSIS — Z471 Aftercare following joint replacement surgery: Secondary | ICD-10-CM | POA: Diagnosis not present
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
## 2018-04-23 NOTE — Telephone Encounter (Addendum)
[name] [name]-(PT) called left voicemail message asking if there are any updates to patient's orders after her appointment with Dr Linna Hoff this week. Anise Salvo said patient is having a mammogram and is concerned about her shoulder during the procedure. Anise Salvo also said patient has a range of motion device in her home and can't manage it without help. Patient asked if the device can be picked up? The number to contact Anise Salvo is [phone]

## 2018-04-24 DIAGNOSIS — I10 Essential (primary) hypertension: Secondary | ICD-10-CM | POA: Diagnosis not present

## 2018-04-25 DIAGNOSIS — I10 Essential (primary) hypertension: Secondary | ICD-10-CM | POA: Diagnosis not present

## 2018-04-26 ENCOUNTER — Telehealth: Payer: Self-pay

## 2018-04-26 DIAGNOSIS — I10 Essential (primary) hypertension: Secondary | ICD-10-CM | POA: Diagnosis not present

## 2018-04-26 NOTE — Telephone Encounter (Signed)
y

## 2018-04-26 NOTE — Telephone Encounter (Signed)
Sharyn Lull with [facility] want to informed the doctor/nurse that [facility] is seeing pt.

## 2018-04-26 NOTE — Telephone Encounter (Signed)
Neesds to use device after hhpt comes over they should leave her in the device when they leave for one hour

## 2018-04-26 NOTE — Telephone Encounter (Signed)
IC and advised per Dr Marlou Sa

## 2018-04-26 NOTE — Telephone Encounter (Signed)
Is it ok for her to have mammogram?

## 2018-04-26 NOTE — Telephone Encounter (Signed)
Please advise. Thanks.  

## 2018-04-27 ENCOUNTER — Other Ambulatory Visit: Payer: Self-pay

## 2018-04-27 DIAGNOSIS — N184 Chronic kidney disease, stage 4 (severe): Secondary | ICD-10-CM | POA: Diagnosis not present

## 2018-04-27 DIAGNOSIS — I10 Essential (primary) hypertension: Secondary | ICD-10-CM | POA: Diagnosis not present

## 2018-04-27 DIAGNOSIS — I129 Hypertensive chronic kidney disease with stage 1 through stage 4 chronic kidney disease, or unspecified chronic kidney disease: Secondary | ICD-10-CM | POA: Diagnosis not present

## 2018-04-27 DIAGNOSIS — E1122 Type 2 diabetes mellitus with diabetic chronic kidney disease: Secondary | ICD-10-CM | POA: Diagnosis not present

## 2018-04-27 DIAGNOSIS — R419 Unspecified symptoms and signs involving cognitive functions and awareness: Secondary | ICD-10-CM | POA: Diagnosis not present

## 2018-04-27 DIAGNOSIS — Z96612 Presence of left artificial shoulder joint: Secondary | ICD-10-CM | POA: Diagnosis not present

## 2018-04-27 DIAGNOSIS — Z471 Aftercare following joint replacement surgery: Secondary | ICD-10-CM | POA: Diagnosis not present

## 2018-04-27 NOTE — Telephone Encounter (Signed)
Requesting meter, test strips and Lancets to be sent to [facility].

## 2018-04-28 DIAGNOSIS — I10 Essential (primary) hypertension: Secondary | ICD-10-CM | POA: Diagnosis not present

## 2018-04-28 NOTE — Telephone Encounter (Signed)
She does not need to be checking her sugars at home. Her a1c has been well controlled for years without taking any diabetes medications.

## 2018-04-29 ENCOUNTER — Ambulatory Visit
Admission: RE | Admit: 2018-04-29 | Discharge: 2018-04-29 | Disposition: A | Discharge status: Home / Self Care | Payer: Medicaid Other | Source: Ambulatory Visit | Attending: Internal Medicine | Admitting: Internal Medicine

## 2018-04-29 ENCOUNTER — Other Ambulatory Visit: Payer: Self-pay | Admitting: Internal Medicine

## 2018-04-29 DIAGNOSIS — Z1231 Encounter for screening mammogram for malignant neoplasm of breast: Secondary | ICD-10-CM

## 2018-04-29 DIAGNOSIS — I10 Essential (primary) hypertension: Secondary | ICD-10-CM | POA: Diagnosis not present

## 2018-04-29 DIAGNOSIS — Z7689 Persons encountering health services in other specified circumstances: Secondary | ICD-10-CM | POA: Diagnosis not present

## 2018-04-29 LAB — CUP PACEART REMOTE DEVICE CHECK
Battery Remaining Percentage: 12 %
Battery Voltage: 2.72 V
Brady Statistic RV Percent Paced: 99 %
Implantable Lead Location: 753859
Implantable Lead Location: 753860
Implantable Pulse Generator Implant Date: 20110225
Lead Channel Impedance Value: 360 Ohm
Lead Channel Impedance Value: 390 Ohm
Lead Channel Sensing Intrinsic Amplitude: 0.7 mV
Lead Channel Setting Pacing Amplitude: 2 V
Lead Channel Setting Pacing Amplitude: 2.5 V
MDC IDC LEAD IMPLANT DT: 20110225
MDC IDC LEAD IMPLANT DT: 20110225
MDC IDC MSMT BATTERY REMAINING LONGEVITY: 12 mo
MDC IDC PG SERIAL: 7114075
MDC IDC SESS DTM: 20190801222302
MDC IDC SET LEADCHNL RV PACING PULSEWIDTH: 0.4 ms
MDC IDC SET LEADCHNL RV SENSING SENSITIVITY: 4 mV
MDC IDC STAT BRADY RA PERCENT PACED: 97 %

## 2018-04-29 NOTE — Telephone Encounter (Signed)
Return call from pt - informed "She does not need to be checking her sugars at home. Her a1c has been well controlled for years without taking any diabetes medications. " per Dr Donne Hazel.  Pt stated ok.

## 2018-04-29 NOTE — Telephone Encounter (Signed)
Called pt - no answer; left message to call the clinic. 

## 2018-04-30 DIAGNOSIS — I129 Hypertensive chronic kidney disease with stage 1 through stage 4 chronic kidney disease, or unspecified chronic kidney disease: Secondary | ICD-10-CM | POA: Diagnosis not present

## 2018-04-30 DIAGNOSIS — R419 Unspecified symptoms and signs involving cognitive functions and awareness: Secondary | ICD-10-CM | POA: Diagnosis not present

## 2018-04-30 DIAGNOSIS — N184 Chronic kidney disease, stage 4 (severe): Secondary | ICD-10-CM | POA: Diagnosis not present

## 2018-04-30 DIAGNOSIS — Z96612 Presence of left artificial shoulder joint: Secondary | ICD-10-CM | POA: Diagnosis not present

## 2018-04-30 DIAGNOSIS — I10 Essential (primary) hypertension: Secondary | ICD-10-CM | POA: Diagnosis not present

## 2018-04-30 DIAGNOSIS — Z471 Aftercare following joint replacement surgery: Secondary | ICD-10-CM | POA: Diagnosis not present

## 2018-04-30 DIAGNOSIS — E1122 Type 2 diabetes mellitus with diabetic chronic kidney disease: Secondary | ICD-10-CM | POA: Diagnosis not present

## 2018-05-01 DIAGNOSIS — I10 Essential (primary) hypertension: Secondary | ICD-10-CM | POA: Diagnosis not present

## 2018-05-02 DIAGNOSIS — I10 Essential (primary) hypertension: Secondary | ICD-10-CM | POA: Diagnosis not present

## 2018-05-03 DIAGNOSIS — I10 Essential (primary) hypertension: Secondary | ICD-10-CM | POA: Diagnosis not present

## 2018-05-04 ENCOUNTER — Other Ambulatory Visit: Payer: Self-pay | Admitting: Internal Medicine

## 2018-05-04 DIAGNOSIS — N184 Chronic kidney disease, stage 4 (severe): Secondary | ICD-10-CM | POA: Diagnosis not present

## 2018-05-04 DIAGNOSIS — I10 Essential (primary) hypertension: Secondary | ICD-10-CM | POA: Diagnosis not present

## 2018-05-04 DIAGNOSIS — Z471 Aftercare following joint replacement surgery: Secondary | ICD-10-CM | POA: Diagnosis not present

## 2018-05-04 DIAGNOSIS — Z96612 Presence of left artificial shoulder joint: Secondary | ICD-10-CM | POA: Diagnosis not present

## 2018-05-04 DIAGNOSIS — E1122 Type 2 diabetes mellitus with diabetic chronic kidney disease: Secondary | ICD-10-CM | POA: Diagnosis not present

## 2018-05-04 DIAGNOSIS — R419 Unspecified symptoms and signs involving cognitive functions and awareness: Secondary | ICD-10-CM | POA: Diagnosis not present

## 2018-05-04 DIAGNOSIS — I129 Hypertensive chronic kidney disease with stage 1 through stage 4 chronic kidney disease, or unspecified chronic kidney disease: Secondary | ICD-10-CM | POA: Diagnosis not present

## 2018-05-05 DIAGNOSIS — I10 Essential (primary) hypertension: Secondary | ICD-10-CM | POA: Diagnosis not present

## 2018-05-06 DIAGNOSIS — I10 Essential (primary) hypertension: Secondary | ICD-10-CM | POA: Diagnosis not present

## 2018-05-07 DIAGNOSIS — E1122 Type 2 diabetes mellitus with diabetic chronic kidney disease: Secondary | ICD-10-CM | POA: Diagnosis not present

## 2018-05-07 DIAGNOSIS — I129 Hypertensive chronic kidney disease with stage 1 through stage 4 chronic kidney disease, or unspecified chronic kidney disease: Secondary | ICD-10-CM | POA: Diagnosis not present

## 2018-05-07 DIAGNOSIS — Z96612 Presence of left artificial shoulder joint: Secondary | ICD-10-CM | POA: Diagnosis not present

## 2018-05-07 DIAGNOSIS — Z471 Aftercare following joint replacement surgery: Secondary | ICD-10-CM | POA: Diagnosis not present

## 2018-05-07 DIAGNOSIS — I10 Essential (primary) hypertension: Secondary | ICD-10-CM | POA: Diagnosis not present

## 2018-05-07 DIAGNOSIS — R419 Unspecified symptoms and signs involving cognitive functions and awareness: Secondary | ICD-10-CM | POA: Diagnosis not present

## 2018-05-07 DIAGNOSIS — N184 Chronic kidney disease, stage 4 (severe): Secondary | ICD-10-CM | POA: Diagnosis not present

## 2018-05-08 DIAGNOSIS — I10 Essential (primary) hypertension: Secondary | ICD-10-CM | POA: Diagnosis not present

## 2018-05-09 DIAGNOSIS — I10 Essential (primary) hypertension: Secondary | ICD-10-CM | POA: Diagnosis not present

## 2018-05-10 DIAGNOSIS — R419 Unspecified symptoms and signs involving cognitive functions and awareness: Secondary | ICD-10-CM | POA: Diagnosis not present

## 2018-05-10 DIAGNOSIS — E1122 Type 2 diabetes mellitus with diabetic chronic kidney disease: Secondary | ICD-10-CM | POA: Diagnosis not present

## 2018-05-10 DIAGNOSIS — Z96612 Presence of left artificial shoulder joint: Secondary | ICD-10-CM | POA: Diagnosis not present

## 2018-05-10 DIAGNOSIS — I129 Hypertensive chronic kidney disease with stage 1 through stage 4 chronic kidney disease, or unspecified chronic kidney disease: Secondary | ICD-10-CM | POA: Diagnosis not present

## 2018-05-10 DIAGNOSIS — I10 Essential (primary) hypertension: Secondary | ICD-10-CM | POA: Diagnosis not present

## 2018-05-10 DIAGNOSIS — N184 Chronic kidney disease, stage 4 (severe): Secondary | ICD-10-CM | POA: Diagnosis not present

## 2018-05-10 DIAGNOSIS — Z471 Aftercare following joint replacement surgery: Secondary | ICD-10-CM | POA: Diagnosis not present

## 2018-05-11 DIAGNOSIS — I10 Essential (primary) hypertension: Secondary | ICD-10-CM | POA: Diagnosis not present

## 2018-05-12 ENCOUNTER — Telehealth (INDEPENDENT_AMBULATORY_CARE_PROVIDER_SITE_OTHER): Payer: Self-pay | Admitting: Orthopedic Surgery

## 2018-05-12 DIAGNOSIS — Z471 Aftercare following joint replacement surgery: Secondary | ICD-10-CM | POA: Diagnosis not present

## 2018-05-12 DIAGNOSIS — N184 Chronic kidney disease, stage 4 (severe): Secondary | ICD-10-CM | POA: Diagnosis not present

## 2018-05-12 DIAGNOSIS — E1122 Type 2 diabetes mellitus with diabetic chronic kidney disease: Secondary | ICD-10-CM | POA: Diagnosis not present

## 2018-05-12 DIAGNOSIS — Z96612 Presence of left artificial shoulder joint: Secondary | ICD-10-CM | POA: Diagnosis not present

## 2018-05-12 DIAGNOSIS — I10 Essential (primary) hypertension: Secondary | ICD-10-CM | POA: Diagnosis not present

## 2018-05-12 DIAGNOSIS — I129 Hypertensive chronic kidney disease with stage 1 through stage 4 chronic kidney disease, or unspecified chronic kidney disease: Secondary | ICD-10-CM | POA: Diagnosis not present

## 2018-05-12 DIAGNOSIS — R419 Unspecified symptoms and signs involving cognitive functions and awareness: Secondary | ICD-10-CM | POA: Diagnosis not present

## 2018-05-12 NOTE — Telephone Encounter (Signed)
[name] with [facility] left a message requesting a VO for [facility] PT for additional 1x a week for 3 weeks.  She also wanted Dr. Marlou Sa to know that the patient refuses to use the ROM device but is making good progress otherwise.  CB#[phone].  Thank you.

## 2018-05-13 DIAGNOSIS — I10 Essential (primary) hypertension: Secondary | ICD-10-CM | POA: Diagnosis not present

## 2018-05-13 NOTE — Telephone Encounter (Signed)
IC verbal given.  

## 2018-05-14 DIAGNOSIS — I10 Essential (primary) hypertension: Secondary | ICD-10-CM | POA: Diagnosis not present

## 2018-05-15 DIAGNOSIS — I10 Essential (primary) hypertension: Secondary | ICD-10-CM | POA: Diagnosis not present

## 2018-05-16 DIAGNOSIS — I10 Essential (primary) hypertension: Secondary | ICD-10-CM | POA: Diagnosis not present

## 2018-05-17 ENCOUNTER — Other Ambulatory Visit: Payer: Self-pay | Admitting: Internal Medicine

## 2018-05-17 DIAGNOSIS — I10 Essential (primary) hypertension: Secondary | ICD-10-CM | POA: Diagnosis not present

## 2018-05-18 DIAGNOSIS — I10 Essential (primary) hypertension: Secondary | ICD-10-CM | POA: Diagnosis not present

## 2018-05-19 DIAGNOSIS — Z471 Aftercare following joint replacement surgery: Secondary | ICD-10-CM | POA: Diagnosis not present

## 2018-05-19 DIAGNOSIS — R419 Unspecified symptoms and signs involving cognitive functions and awareness: Secondary | ICD-10-CM | POA: Diagnosis not present

## 2018-05-19 DIAGNOSIS — I10 Essential (primary) hypertension: Secondary | ICD-10-CM | POA: Diagnosis not present

## 2018-05-19 DIAGNOSIS — E1122 Type 2 diabetes mellitus with diabetic chronic kidney disease: Secondary | ICD-10-CM | POA: Diagnosis not present

## 2018-05-19 DIAGNOSIS — I129 Hypertensive chronic kidney disease with stage 1 through stage 4 chronic kidney disease, or unspecified chronic kidney disease: Secondary | ICD-10-CM | POA: Diagnosis not present

## 2018-05-19 DIAGNOSIS — N184 Chronic kidney disease, stage 4 (severe): Secondary | ICD-10-CM | POA: Diagnosis not present

## 2018-05-19 DIAGNOSIS — Z96612 Presence of left artificial shoulder joint: Secondary | ICD-10-CM | POA: Diagnosis not present

## 2018-05-20 DIAGNOSIS — I10 Essential (primary) hypertension: Secondary | ICD-10-CM | POA: Diagnosis not present

## 2018-05-21 DIAGNOSIS — I10 Essential (primary) hypertension: Secondary | ICD-10-CM | POA: Diagnosis not present

## 2018-05-22 DIAGNOSIS — I10 Essential (primary) hypertension: Secondary | ICD-10-CM | POA: Diagnosis not present

## 2018-05-23 DIAGNOSIS — I10 Essential (primary) hypertension: Secondary | ICD-10-CM | POA: Diagnosis not present

## 2018-05-24 DIAGNOSIS — I10 Essential (primary) hypertension: Secondary | ICD-10-CM | POA: Diagnosis not present

## 2018-05-25 DIAGNOSIS — I10 Essential (primary) hypertension: Secondary | ICD-10-CM | POA: Diagnosis not present

## 2018-05-26 DIAGNOSIS — E1122 Type 2 diabetes mellitus with diabetic chronic kidney disease: Secondary | ICD-10-CM | POA: Diagnosis not present

## 2018-05-26 DIAGNOSIS — N184 Chronic kidney disease, stage 4 (severe): Secondary | ICD-10-CM | POA: Diagnosis not present

## 2018-05-26 DIAGNOSIS — I10 Essential (primary) hypertension: Secondary | ICD-10-CM | POA: Diagnosis not present

## 2018-05-26 DIAGNOSIS — Z96612 Presence of left artificial shoulder joint: Secondary | ICD-10-CM | POA: Diagnosis not present

## 2018-05-26 DIAGNOSIS — I129 Hypertensive chronic kidney disease with stage 1 through stage 4 chronic kidney disease, or unspecified chronic kidney disease: Secondary | ICD-10-CM | POA: Diagnosis not present

## 2018-05-26 DIAGNOSIS — R419 Unspecified symptoms and signs involving cognitive functions and awareness: Secondary | ICD-10-CM | POA: Diagnosis not present

## 2018-05-26 DIAGNOSIS — Z471 Aftercare following joint replacement surgery: Secondary | ICD-10-CM | POA: Diagnosis not present

## 2018-05-27 DIAGNOSIS — I10 Essential (primary) hypertension: Secondary | ICD-10-CM | POA: Diagnosis not present

## 2018-05-28 DIAGNOSIS — I10 Essential (primary) hypertension: Secondary | ICD-10-CM | POA: Diagnosis not present

## 2018-05-29 DIAGNOSIS — I10 Essential (primary) hypertension: Secondary | ICD-10-CM | POA: Diagnosis not present

## 2018-05-30 DIAGNOSIS — I10 Essential (primary) hypertension: Secondary | ICD-10-CM | POA: Diagnosis not present

## 2018-05-31 DIAGNOSIS — I10 Essential (primary) hypertension: Secondary | ICD-10-CM | POA: Diagnosis not present

## 2018-06-01 DIAGNOSIS — I10 Essential (primary) hypertension: Secondary | ICD-10-CM | POA: Diagnosis not present

## 2018-06-02 ENCOUNTER — Encounter (INDEPENDENT_AMBULATORY_CARE_PROVIDER_SITE_OTHER): Payer: Self-pay | Admitting: Orthopedic Surgery

## 2018-06-02 ENCOUNTER — Ambulatory Visit (INDEPENDENT_AMBULATORY_CARE_PROVIDER_SITE_OTHER): Payer: Medicaid Other | Admitting: Orthopedic Surgery

## 2018-06-02 DIAGNOSIS — Z96612 Presence of left artificial shoulder joint: Secondary | ICD-10-CM

## 2018-06-02 DIAGNOSIS — Z7689 Persons encountering health services in other specified circumstances: Secondary | ICD-10-CM | POA: Diagnosis not present

## 2018-06-02 DIAGNOSIS — I10 Essential (primary) hypertension: Secondary | ICD-10-CM | POA: Diagnosis not present

## 2018-06-03 ENCOUNTER — Telehealth (INDEPENDENT_AMBULATORY_CARE_PROVIDER_SITE_OTHER): Payer: Self-pay | Admitting: Orthopedic Surgery

## 2018-06-03 DIAGNOSIS — N184 Chronic kidney disease, stage 4 (severe): Secondary | ICD-10-CM | POA: Diagnosis not present

## 2018-06-03 DIAGNOSIS — Z471 Aftercare following joint replacement surgery: Secondary | ICD-10-CM | POA: Diagnosis not present

## 2018-06-03 DIAGNOSIS — I10 Essential (primary) hypertension: Secondary | ICD-10-CM | POA: Diagnosis not present

## 2018-06-03 DIAGNOSIS — I129 Hypertensive chronic kidney disease with stage 1 through stage 4 chronic kidney disease, or unspecified chronic kidney disease: Secondary | ICD-10-CM | POA: Diagnosis not present

## 2018-06-03 DIAGNOSIS — R419 Unspecified symptoms and signs involving cognitive functions and awareness: Secondary | ICD-10-CM | POA: Diagnosis not present

## 2018-06-03 DIAGNOSIS — Z96612 Presence of left artificial shoulder joint: Secondary | ICD-10-CM | POA: Diagnosis not present

## 2018-06-03 DIAGNOSIS — E1122 Type 2 diabetes mellitus with diabetic chronic kidney disease: Secondary | ICD-10-CM | POA: Diagnosis not present

## 2018-06-03 NOTE — Telephone Encounter (Signed)
Please advise. Thanks.  

## 2018-06-03 NOTE — Telephone Encounter (Signed)
[name] [name], PT [facility] is calling to see if there has been any changes in precautions as far as active ROM or weightbearing through the shoulder? Also if Dr. Marlou Sa would like her to continue with HHPT and to recertify for additional visits.   CB # [phone]

## 2018-06-04 DIAGNOSIS — I10 Essential (primary) hypertension: Secondary | ICD-10-CM | POA: Diagnosis not present

## 2018-06-04 NOTE — Telephone Encounter (Signed)
Okay for active and active assisted range of motion of that shoulder.  I would not would do any weightbearing through that shoulder until the 6-week postop time.  Please get her going for additional visits for the next 6 weeks.  Thanks

## 2018-06-04 NOTE — Telephone Encounter (Signed)
IC no answer LM advising per Dr Marlou Sa.

## 2018-06-05 ENCOUNTER — Encounter (INDEPENDENT_AMBULATORY_CARE_PROVIDER_SITE_OTHER): Payer: Self-pay | Admitting: Orthopedic Surgery

## 2018-06-05 DIAGNOSIS — I10 Essential (primary) hypertension: Secondary | ICD-10-CM | POA: Diagnosis not present
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
## 2018-06-06 DIAGNOSIS — I10 Essential (primary) hypertension: Secondary | ICD-10-CM | POA: Diagnosis not present

## 2018-06-07 DIAGNOSIS — I10 Essential (primary) hypertension: Secondary | ICD-10-CM | POA: Diagnosis not present

## 2018-06-08 ENCOUNTER — Other Ambulatory Visit: Payer: Self-pay | Admitting: Student in an Organized Health Care Education/Training Program

## 2018-06-08 DIAGNOSIS — I10 Essential (primary) hypertension: Secondary | ICD-10-CM | POA: Diagnosis not present

## 2018-06-09 DIAGNOSIS — I10 Essential (primary) hypertension: Secondary | ICD-10-CM | POA: Diagnosis not present

## 2018-06-10 DIAGNOSIS — I10 Essential (primary) hypertension: Secondary | ICD-10-CM | POA: Diagnosis not present

## 2018-06-10 DIAGNOSIS — Z96612 Presence of left artificial shoulder joint: Secondary | ICD-10-CM | POA: Diagnosis not present

## 2018-06-10 DIAGNOSIS — Z471 Aftercare following joint replacement surgery: Secondary | ICD-10-CM | POA: Diagnosis not present

## 2018-06-10 DIAGNOSIS — R419 Unspecified symptoms and signs involving cognitive functions and awareness: Secondary | ICD-10-CM | POA: Diagnosis not present

## 2018-06-10 DIAGNOSIS — N184 Chronic kidney disease, stage 4 (severe): Secondary | ICD-10-CM | POA: Diagnosis not present

## 2018-06-10 DIAGNOSIS — E1122 Type 2 diabetes mellitus with diabetic chronic kidney disease: Secondary | ICD-10-CM | POA: Diagnosis not present

## 2018-06-10 DIAGNOSIS — I129 Hypertensive chronic kidney disease with stage 1 through stage 4 chronic kidney disease, or unspecified chronic kidney disease: Secondary | ICD-10-CM | POA: Diagnosis not present

## 2018-06-11 DIAGNOSIS — I10 Essential (primary) hypertension: Secondary | ICD-10-CM | POA: Diagnosis not present

## 2018-06-12 DIAGNOSIS — I10 Essential (primary) hypertension: Secondary | ICD-10-CM | POA: Diagnosis not present

## 2018-06-13 DIAGNOSIS — I10 Essential (primary) hypertension: Secondary | ICD-10-CM | POA: Diagnosis not present

## 2018-06-14 DIAGNOSIS — I10 Essential (primary) hypertension: Secondary | ICD-10-CM | POA: Diagnosis not present

## 2018-06-15 DIAGNOSIS — I10 Essential (primary) hypertension: Secondary | ICD-10-CM | POA: Diagnosis not present

## 2018-06-15 DIAGNOSIS — N2581 Secondary hyperparathyroidism of renal origin: Secondary | ICD-10-CM | POA: Diagnosis not present

## 2018-06-15 DIAGNOSIS — D649 Anemia, unspecified: Secondary | ICD-10-CM | POA: Diagnosis not present

## 2018-06-15 DIAGNOSIS — N184 Chronic kidney disease, stage 4 (severe): Secondary | ICD-10-CM | POA: Diagnosis not present

## 2018-06-16 DIAGNOSIS — I10 Essential (primary) hypertension: Secondary | ICD-10-CM | POA: Diagnosis not present

## 2018-06-17 DIAGNOSIS — I10 Essential (primary) hypertension: Secondary | ICD-10-CM | POA: Diagnosis not present

## 2018-06-18 DIAGNOSIS — I10 Essential (primary) hypertension: Secondary | ICD-10-CM | POA: Diagnosis not present

## 2018-06-19 DIAGNOSIS — I10 Essential (primary) hypertension: Secondary | ICD-10-CM | POA: Diagnosis not present

## 2018-06-20 DIAGNOSIS — I10 Essential (primary) hypertension: Secondary | ICD-10-CM | POA: Diagnosis not present

## 2018-06-21 DIAGNOSIS — I10 Essential (primary) hypertension: Secondary | ICD-10-CM | POA: Diagnosis not present

## 2018-06-22 DIAGNOSIS — N184 Chronic kidney disease, stage 4 (severe): Secondary | ICD-10-CM | POA: Diagnosis not present

## 2018-06-22 DIAGNOSIS — I10 Essential (primary) hypertension: Secondary | ICD-10-CM | POA: Diagnosis not present

## 2018-06-22 DIAGNOSIS — Z471 Aftercare following joint replacement surgery: Secondary | ICD-10-CM | POA: Diagnosis not present

## 2018-06-22 DIAGNOSIS — R419 Unspecified symptoms and signs involving cognitive functions and awareness: Secondary | ICD-10-CM | POA: Diagnosis not present

## 2018-06-22 DIAGNOSIS — I129 Hypertensive chronic kidney disease with stage 1 through stage 4 chronic kidney disease, or unspecified chronic kidney disease: Secondary | ICD-10-CM | POA: Diagnosis not present

## 2018-06-22 DIAGNOSIS — Z96612 Presence of left artificial shoulder joint: Secondary | ICD-10-CM | POA: Diagnosis not present

## 2018-06-22 DIAGNOSIS — E1122 Type 2 diabetes mellitus with diabetic chronic kidney disease: Secondary | ICD-10-CM | POA: Diagnosis not present

## 2018-06-23 DIAGNOSIS — I10 Essential (primary) hypertension: Secondary | ICD-10-CM | POA: Diagnosis not present

## 2018-06-24 DIAGNOSIS — I10 Essential (primary) hypertension: Secondary | ICD-10-CM | POA: Diagnosis not present

## 2018-06-25 DIAGNOSIS — R419 Unspecified symptoms and signs involving cognitive functions and awareness: Secondary | ICD-10-CM | POA: Diagnosis not present

## 2018-06-25 DIAGNOSIS — Z96612 Presence of left artificial shoulder joint: Secondary | ICD-10-CM | POA: Diagnosis not present

## 2018-06-25 DIAGNOSIS — N184 Chronic kidney disease, stage 4 (severe): Secondary | ICD-10-CM | POA: Diagnosis not present

## 2018-06-25 DIAGNOSIS — Z471 Aftercare following joint replacement surgery: Secondary | ICD-10-CM | POA: Diagnosis not present

## 2018-06-25 DIAGNOSIS — I129 Hypertensive chronic kidney disease with stage 1 through stage 4 chronic kidney disease, or unspecified chronic kidney disease: Secondary | ICD-10-CM | POA: Diagnosis not present

## 2018-06-25 DIAGNOSIS — E1122 Type 2 diabetes mellitus with diabetic chronic kidney disease: Secondary | ICD-10-CM | POA: Diagnosis not present

## 2018-06-25 DIAGNOSIS — I10 Essential (primary) hypertension: Secondary | ICD-10-CM | POA: Diagnosis not present

## 2018-06-26 DIAGNOSIS — I10 Essential (primary) hypertension: Secondary | ICD-10-CM | POA: Diagnosis not present

## 2018-06-27 DIAGNOSIS — I10 Essential (primary) hypertension: Secondary | ICD-10-CM | POA: Diagnosis not present

## 2018-06-28 DIAGNOSIS — I10 Essential (primary) hypertension: Secondary | ICD-10-CM | POA: Diagnosis not present

## 2018-06-29 DIAGNOSIS — I10 Essential (primary) hypertension: Secondary | ICD-10-CM | POA: Diagnosis not present

## 2018-06-30 DIAGNOSIS — I10 Essential (primary) hypertension: Secondary | ICD-10-CM | POA: Diagnosis not present

## 2018-06-30 DIAGNOSIS — Z471 Aftercare following joint replacement surgery: Secondary | ICD-10-CM | POA: Diagnosis not present

## 2018-06-30 DIAGNOSIS — R419 Unspecified symptoms and signs involving cognitive functions and awareness: Secondary | ICD-10-CM | POA: Diagnosis not present

## 2018-06-30 DIAGNOSIS — Z96612 Presence of left artificial shoulder joint: Secondary | ICD-10-CM | POA: Diagnosis not present

## 2018-06-30 DIAGNOSIS — N184 Chronic kidney disease, stage 4 (severe): Secondary | ICD-10-CM | POA: Diagnosis not present

## 2018-06-30 DIAGNOSIS — E1122 Type 2 diabetes mellitus with diabetic chronic kidney disease: Secondary | ICD-10-CM | POA: Diagnosis not present

## 2018-06-30 DIAGNOSIS — I129 Hypertensive chronic kidney disease with stage 1 through stage 4 chronic kidney disease, or unspecified chronic kidney disease: Secondary | ICD-10-CM | POA: Diagnosis not present

## 2018-07-01 ENCOUNTER — Other Ambulatory Visit: Payer: Self-pay | Admitting: Internal Medicine

## 2018-07-01 DIAGNOSIS — I10 Essential (primary) hypertension: Secondary | ICD-10-CM | POA: Diagnosis not present

## 2018-07-01 DIAGNOSIS — I429 Cardiomyopathy, unspecified: Secondary | ICD-10-CM

## 2018-07-02 DIAGNOSIS — I10 Essential (primary) hypertension: Secondary | ICD-10-CM | POA: Diagnosis not present

## 2018-07-02 DIAGNOSIS — E1122 Type 2 diabetes mellitus with diabetic chronic kidney disease: Secondary | ICD-10-CM | POA: Diagnosis not present

## 2018-07-02 DIAGNOSIS — Z96612 Presence of left artificial shoulder joint: Secondary | ICD-10-CM | POA: Diagnosis not present

## 2018-07-02 DIAGNOSIS — I129 Hypertensive chronic kidney disease with stage 1 through stage 4 chronic kidney disease, or unspecified chronic kidney disease: Secondary | ICD-10-CM | POA: Diagnosis not present

## 2018-07-02 DIAGNOSIS — Z471 Aftercare following joint replacement surgery: Secondary | ICD-10-CM | POA: Diagnosis not present

## 2018-07-02 DIAGNOSIS — N184 Chronic kidney disease, stage 4 (severe): Secondary | ICD-10-CM | POA: Diagnosis not present

## 2018-07-02 DIAGNOSIS — R419 Unspecified symptoms and signs involving cognitive functions and awareness: Secondary | ICD-10-CM | POA: Diagnosis not present

## 2018-07-03 DIAGNOSIS — I10 Essential (primary) hypertension: Secondary | ICD-10-CM | POA: Diagnosis not present

## 2018-07-04 DIAGNOSIS — I10 Essential (primary) hypertension: Secondary | ICD-10-CM | POA: Diagnosis not present

## 2018-07-05 ENCOUNTER — Ambulatory Visit (INDEPENDENT_AMBULATORY_CARE_PROVIDER_SITE_OTHER): Payer: Medicaid Other | Admitting: *Deleted

## 2018-07-05 DIAGNOSIS — I442 Atrioventricular block, complete: Secondary | ICD-10-CM

## 2018-07-05 DIAGNOSIS — N184 Chronic kidney disease, stage 4 (severe): Secondary | ICD-10-CM | POA: Diagnosis not present

## 2018-07-05 DIAGNOSIS — Z96612 Presence of left artificial shoulder joint: Secondary | ICD-10-CM | POA: Diagnosis not present

## 2018-07-05 DIAGNOSIS — R419 Unspecified symptoms and signs involving cognitive functions and awareness: Secondary | ICD-10-CM | POA: Diagnosis not present

## 2018-07-05 DIAGNOSIS — I129 Hypertensive chronic kidney disease with stage 1 through stage 4 chronic kidney disease, or unspecified chronic kidney disease: Secondary | ICD-10-CM | POA: Diagnosis not present

## 2018-07-05 DIAGNOSIS — I10 Essential (primary) hypertension: Secondary | ICD-10-CM | POA: Diagnosis not present

## 2018-07-05 DIAGNOSIS — Z471 Aftercare following joint replacement surgery: Secondary | ICD-10-CM | POA: Diagnosis not present

## 2018-07-05 DIAGNOSIS — E1122 Type 2 diabetes mellitus with diabetic chronic kidney disease: Secondary | ICD-10-CM | POA: Diagnosis not present

## 2018-07-05 NOTE — Progress Notes (Signed)
Remote pacemaker transmission.   

## 2018-07-06 ENCOUNTER — Other Ambulatory Visit: Payer: Self-pay

## 2018-07-06 DIAGNOSIS — I10 Essential (primary) hypertension: Secondary | ICD-10-CM | POA: Diagnosis not present

## 2018-07-06 NOTE — Telephone Encounter (Signed)
oxyCODONE (OXY IR/ROXICODONE) 5 MG immediate release tablet   Refill request @  [facility], [place], [place] - [place] [phone] (Phone) [phone] (Fax)

## 2018-07-06 NOTE — Telephone Encounter (Signed)
Last rx written  04/02/18. Last OV 7/18. UDS 01/01/16.

## 2018-07-06 NOTE — Telephone Encounter (Signed)
Patient has appt with PCP 07/15/2018. Hubbard Hartshorn, RN, BSN

## 2018-07-06 NOTE — Telephone Encounter (Signed)
Thank you for getting her an appointment with me. This oxycodone was prescribed by her orthopedic surgeon after her shoulder replacement. I might advise her to follow up with ortho but I am happy to see her on 10/17 to discuss her pain.

## 2018-07-06 NOTE — Telephone Encounter (Signed)
Patient called in asking about pain med. Made aware that this was originally prescribed by ortho and PCP needs to see patient before refilling. Patient states understanding. Confirmed appt with PCP on 10/17. Hubbard Hartshorn, RN, BSN

## 2018-07-07 DIAGNOSIS — I10 Essential (primary) hypertension: Secondary | ICD-10-CM | POA: Diagnosis not present

## 2018-07-08 ENCOUNTER — Encounter: Payer: Medicaid Other | Admitting: Internal Medicine

## 2018-07-08 DIAGNOSIS — I10 Essential (primary) hypertension: Secondary | ICD-10-CM | POA: Diagnosis not present

## 2018-07-09 DIAGNOSIS — I10 Essential (primary) hypertension: Secondary | ICD-10-CM | POA: Diagnosis not present

## 2018-07-10 DIAGNOSIS — I10 Essential (primary) hypertension: Secondary | ICD-10-CM | POA: Diagnosis not present

## 2018-07-11 DIAGNOSIS — I10 Essential (primary) hypertension: Secondary | ICD-10-CM | POA: Diagnosis not present

## 2018-07-12 ENCOUNTER — Ambulatory Visit: Payer: Medicaid Other | Admitting: Podiatry

## 2018-07-12 DIAGNOSIS — I10 Essential (primary) hypertension: Secondary | ICD-10-CM | POA: Diagnosis not present

## 2018-07-13 DIAGNOSIS — I10 Essential (primary) hypertension: Secondary | ICD-10-CM | POA: Diagnosis not present

## 2018-07-13 DIAGNOSIS — Z7689 Persons encountering health services in other specified circumstances: Secondary | ICD-10-CM | POA: Diagnosis not present

## 2018-07-14 ENCOUNTER — Encounter: Payer: Self-pay | Admitting: Podiatry

## 2018-07-14 ENCOUNTER — Ambulatory Visit: Payer: Medicaid Other | Admitting: Podiatry

## 2018-07-14 ENCOUNTER — Encounter: Payer: Self-pay | Admitting: Cardiology

## 2018-07-14 DIAGNOSIS — M79676 Pain in unspecified toe(s): Secondary | ICD-10-CM

## 2018-07-14 DIAGNOSIS — B351 Tinea unguium: Secondary | ICD-10-CM | POA: Diagnosis not present

## 2018-07-14 DIAGNOSIS — E0843 Diabetes mellitus due to underlying condition with diabetic autonomic (poly)neuropathy: Secondary | ICD-10-CM

## 2018-07-14 DIAGNOSIS — I10 Essential (primary) hypertension: Secondary | ICD-10-CM | POA: Diagnosis not present

## 2018-07-14 DIAGNOSIS — Z7689 Persons encountering health services in other specified circumstances: Secondary | ICD-10-CM | POA: Diagnosis not present

## 2018-07-14 DIAGNOSIS — L989 Disorder of the skin and subcutaneous tissue, unspecified: Secondary | ICD-10-CM

## 2018-07-15 ENCOUNTER — Other Ambulatory Visit: Payer: Self-pay

## 2018-07-15 ENCOUNTER — Ambulatory Visit: Payer: Medicaid Other | Admitting: Internal Medicine

## 2018-07-15 ENCOUNTER — Encounter: Payer: Self-pay | Admitting: Internal Medicine

## 2018-07-15 DIAGNOSIS — I129 Hypertensive chronic kidney disease with stage 1 through stage 4 chronic kidney disease, or unspecified chronic kidney disease: Secondary | ICD-10-CM

## 2018-07-15 DIAGNOSIS — I10 Essential (primary) hypertension: Secondary | ICD-10-CM | POA: Diagnosis not present

## 2018-07-15 DIAGNOSIS — N184 Chronic kidney disease, stage 4 (severe): Secondary | ICD-10-CM

## 2018-07-15 DIAGNOSIS — G8929 Other chronic pain: Secondary | ICD-10-CM

## 2018-07-15 DIAGNOSIS — Z96612 Presence of left artificial shoulder joint: Secondary | ICD-10-CM | POA: Diagnosis not present

## 2018-07-15 DIAGNOSIS — Z79899 Other long term (current) drug therapy: Secondary | ICD-10-CM

## 2018-07-15 DIAGNOSIS — Z7689 Persons encountering health services in other specified circumstances: Secondary | ICD-10-CM | POA: Diagnosis not present

## 2018-07-15 DIAGNOSIS — Z79891 Long term (current) use of opiate analgesic: Secondary | ICD-10-CM

## 2018-07-15 DIAGNOSIS — M7502 Adhesive capsulitis of left shoulder: Secondary | ICD-10-CM | POA: Diagnosis not present

## 2018-07-15 MED ORDER — DICLOFENAC SODIUM 1 % TD GEL: 2.0000 g | Freq: Four times a day (QID) | TRANSDERMAL | 0 refills | Status: DC

## 2018-07-15 MED ORDER — OXYCODONE HCL 5 MG PO TABS: 5.0000 mg | ORAL_TABLET | ORAL | 0 refills | Status: DC | PRN

## 2018-07-15 MED ORDER — ACETAMINOPHEN 500 MG PO TABS: 1000.0000 mg | ORAL_TABLET | Freq: Four times a day (QID) | ORAL | 2 refills | Status: DC | PRN
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
## 2018-07-15 NOTE — Patient Instructions (Addendum)
It was nice seeing you today. Thank you for choosing [facility] for your Primary Care.   Today we talked about:  1) Shoulder Pain: I will give you 10 more tablets of oxycodone. I also prescribed high dose tylenol and voltaren gel. Please reserve the oxycodone for severe pain. Your pain should continue to improve. If it doesn't, please follow up with your othropedic doctor.   FOLLOW-UP INSTRUCTIONS When: as needed For:  What to bring:   Please contact the clinic if you have any problems, or need to be seen sooner.

## 2018-07-15 NOTE — Assessment & Plan Note (Signed)
BP Readings from Last 3 Encounters:  07/15/18 (!) 180/63  04/15/18 (!) 168/63  04/02/18 (!) 146/55   Elevated today but seen by nephrology last month and records show a blood pressure of 110/80.  [name] [name] denies headache, shortness of breath, chest pain, abdominal pain, blurry vision.  She reports adherence to all medicines on her med list.  Renal function was checked last month with nephrology and is stable  Plan: -Continue Coreg, Lasix, lisinopril, BiDil

## 2018-07-15 NOTE — Assessment & Plan Note (Signed)
Evaluated by orthopedic surgeon.  They concluded severe arthritis and posterior dislocation of the shoulder.  She is status post reverse left shoulder arthroplasty.  Incision is healing well and she is undergoing home health PT with good progress.  She does, however, reports significant pain during physical therapy.  She is almost out of the oxycodone prescribed by her orthopedic surgeon.  She denies constipation or drowsiness.  PMP database is appropriate.  Exam reveals decent range of motion, no signs of swelling or joint infection.  Plan: -Prescribe 5 mg oxycodone 10 tablets -Prescribed Tylenol and Voltaren gel -If she continues to have worsening pain, instructed her to follow-up with her orthopedic surgeon

## 2018-07-16 DIAGNOSIS — I10 Essential (primary) hypertension: Secondary | ICD-10-CM | POA: Diagnosis not present

## 2018-07-16 NOTE — Progress Notes (Signed)
Internal Medicine Clinic Attending  I saw and evaluated the patient.  I personally confirmed the key portions of the history and exam documented by Dr. Vogel and I reviewed pertinent patient test results.  The assessment, diagnosis, and plan were formulated together and I agree with the documentation in the resident's note.  

## 2018-07-17 DIAGNOSIS — I10 Essential (primary) hypertension: Secondary | ICD-10-CM | POA: Diagnosis not present

## 2018-07-18 DIAGNOSIS — I10 Essential (primary) hypertension: Secondary | ICD-10-CM | POA: Diagnosis not present

## 2018-07-19 DIAGNOSIS — I10 Essential (primary) hypertension: Secondary | ICD-10-CM | POA: Diagnosis not present

## 2018-07-20 DIAGNOSIS — I10 Essential (primary) hypertension: Secondary | ICD-10-CM | POA: Diagnosis not present

## 2018-07-21 ENCOUNTER — Other Ambulatory Visit: Payer: Self-pay | Admitting: Internal Medicine

## 2018-07-21 DIAGNOSIS — I10 Essential (primary) hypertension: Secondary | ICD-10-CM | POA: Diagnosis not present

## 2018-07-21 DIAGNOSIS — M7502 Adhesive capsulitis of left shoulder: Secondary | ICD-10-CM

## 2018-07-21 NOTE — Telephone Encounter (Signed)
Refill Request   oxyCODONE (OXY IR/ROXICODONE) 5 MG immediate release tablet  [facility]- [place], [place] - [place], [place] - [address]

## 2018-07-22 DIAGNOSIS — I10 Essential (primary) hypertension: Secondary | ICD-10-CM | POA: Diagnosis not present
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
## 2018-07-23 DIAGNOSIS — I10 Essential (primary) hypertension: Secondary | ICD-10-CM | POA: Diagnosis not present

## 2018-07-24 DIAGNOSIS — I10 Essential (primary) hypertension: Secondary | ICD-10-CM | POA: Diagnosis not present

## 2018-07-25 DIAGNOSIS — I10 Essential (primary) hypertension: Secondary | ICD-10-CM | POA: Diagnosis not present

## 2018-07-26 ENCOUNTER — Telehealth: Payer: Self-pay

## 2018-07-26 DIAGNOSIS — I10 Essential (primary) hypertension: Secondary | ICD-10-CM | POA: Diagnosis not present

## 2018-07-26 NOTE — Telephone Encounter (Signed)
Requesting cough med to be filled. Please call back.

## 2018-07-26 NOTE — Telephone Encounter (Signed)
Returned telephone call, pt c/o coughing X 3 days .  C/O throat pain, bloody sputum this morning.  Advised pt to schedule appt to be seen , pt declines and request tessalon pearles RX.  States she was in for an office visit 2 weeks ago, explained to pt last office visit was to evaluate htn and pain.  Pt request refill request be sent to [facility]

## 2018-07-27 ENCOUNTER — Telehealth: Payer: Self-pay | Admitting: Internal Medicine

## 2018-07-27 DIAGNOSIS — I10 Essential (primary) hypertension: Secondary | ICD-10-CM | POA: Diagnosis not present

## 2018-07-27 NOTE — Telephone Encounter (Signed)
Contacted [name] [name] to tell her about our [facility]. During the call, [name] [name] complained of a severe cough, causing significant discomfort. She requested a refill of her tessalon pearls. She was adviced to come in for an appointment. Initially, she refused, but later said she would call for an appointment.

## 2018-07-27 NOTE — Telephone Encounter (Signed)
Pt is calling back requesting pills for her cough, [employer]. Pls call back when pharmacy been sent

## 2018-07-28 ENCOUNTER — Other Ambulatory Visit: Payer: Self-pay | Admitting: Internal Medicine

## 2018-07-28 DIAGNOSIS — I10 Essential (primary) hypertension: Secondary | ICD-10-CM | POA: Diagnosis not present

## 2018-07-28 NOTE — Telephone Encounter (Signed)
Pt called and notified she will need to be seen in office for any medications r/t cough, pt verbalized understanding.  Transferred to front desk and appt made for 08/03/18 per pt's request due to transportation issues.

## 2018-07-28 NOTE — Telephone Encounter (Signed)
Patient needs to be seen in the clinic and evaluated for this cough before I will prescribe any medications to treat it. I tried calling the patient but she did not answer.

## 2018-07-29 DIAGNOSIS — I10 Essential (primary) hypertension: Secondary | ICD-10-CM | POA: Diagnosis not present

## 2018-07-30 DIAGNOSIS — I10 Essential (primary) hypertension: Secondary | ICD-10-CM | POA: Diagnosis not present

## 2018-07-30 NOTE — Telephone Encounter (Signed)
Per Epic, pt has an appt on 11/5 in [facility].

## 2018-07-31 DIAGNOSIS — I10 Essential (primary) hypertension: Secondary | ICD-10-CM | POA: Diagnosis not present

## 2018-08-01 DIAGNOSIS — I10 Essential (primary) hypertension: Secondary | ICD-10-CM | POA: Diagnosis not present

## 2018-08-02 DIAGNOSIS — I10 Essential (primary) hypertension: Secondary | ICD-10-CM | POA: Diagnosis not present

## 2018-08-03 ENCOUNTER — Other Ambulatory Visit: Payer: Self-pay | Admitting: Internal Medicine

## 2018-08-03 ENCOUNTER — Other Ambulatory Visit: Payer: Self-pay

## 2018-08-03 ENCOUNTER — Ambulatory Visit: Payer: Medicaid Other | Admitting: Internal Medicine

## 2018-08-03 ENCOUNTER — Encounter: Payer: Self-pay | Admitting: Internal Medicine

## 2018-08-03 VITALS — BP 166/57 | HR 62 | Temp 99.0°F | Wt 131.8 lb

## 2018-08-03 DIAGNOSIS — Z950 Presence of cardiac pacemaker: Secondary | ICD-10-CM

## 2018-08-03 DIAGNOSIS — Z79899 Other long term (current) drug therapy: Secondary | ICD-10-CM | POA: Diagnosis not present

## 2018-08-03 DIAGNOSIS — R05 Cough: Secondary | ICD-10-CM | POA: Diagnosis not present

## 2018-08-03 DIAGNOSIS — K529 Noninfective gastroenteritis and colitis, unspecified: Secondary | ICD-10-CM

## 2018-08-03 DIAGNOSIS — E559 Vitamin D deficiency, unspecified: Secondary | ICD-10-CM | POA: Diagnosis not present

## 2018-08-03 DIAGNOSIS — N184 Chronic kidney disease, stage 4 (severe): Secondary | ICD-10-CM

## 2018-08-03 DIAGNOSIS — I2510 Atherosclerotic heart disease of native coronary artery without angina pectoris: Secondary | ICD-10-CM | POA: Diagnosis not present

## 2018-08-03 DIAGNOSIS — E1122 Type 2 diabetes mellitus with diabetic chronic kidney disease: Secondary | ICD-10-CM | POA: Diagnosis not present

## 2018-08-03 DIAGNOSIS — I10 Essential (primary) hypertension: Secondary | ICD-10-CM | POA: Diagnosis not present

## 2018-08-03 DIAGNOSIS — Z7689 Persons encountering health services in other specified circumstances: Secondary | ICD-10-CM | POA: Diagnosis not present

## 2018-08-03 DIAGNOSIS — Z89512 Acquired absence of left leg below knee: Secondary | ICD-10-CM | POA: Diagnosis not present

## 2018-08-03 DIAGNOSIS — I129 Hypertensive chronic kidney disease with stage 1 through stage 4 chronic kidney disease, or unspecified chronic kidney disease: Secondary | ICD-10-CM | POA: Diagnosis not present

## 2018-08-03 DIAGNOSIS — E1151 Type 2 diabetes mellitus with diabetic peripheral angiopathy without gangrene: Secondary | ICD-10-CM

## 2018-08-03 DIAGNOSIS — Z720 Tobacco use: Secondary | ICD-10-CM

## 2018-08-03 DIAGNOSIS — M7502 Adhesive capsulitis of left shoulder: Secondary | ICD-10-CM

## 2018-08-03 DIAGNOSIS — R053 Chronic cough: Secondary | ICD-10-CM

## 2018-08-03 MED ORDER — PANTOPRAZOLE SODIUM 40 MG PO TBEC: 40.0000 mg | DELAYED_RELEASE_TABLET | Freq: Every day | ORAL | 2 refills | Status: DC

## 2018-08-03 MED ORDER — LOPERAMIDE HCL 2 MG PO TABS: 2.0000 mg | ORAL_TABLET | Freq: Four times a day (QID) | ORAL | 0 refills | Status: DC | PRN

## 2018-08-03 MED ORDER — ALBUTEROL SULFATE HFA 108 (90 BASE) MCG/ACT IN AERS: 2.0000 | INHALATION_SPRAY | Freq: Four times a day (QID) | RESPIRATORY_TRACT | 2 refills | Status: DC | PRN

## 2018-08-03 MED ORDER — IRBESARTAN 150 MG PO TABS: 150.0000 mg | ORAL_TABLET | Freq: Every day | ORAL | 1 refills | Status: DC

## 2018-08-03 MED ORDER — BENZONATATE 100 MG PO CAPS: 100.0000 mg | ORAL_CAPSULE | Freq: Three times a day (TID) | ORAL | 1 refills | Status: AC

## 2018-08-03 NOTE — Assessment & Plan Note (Addendum)
[name] [name] reports chronic diarrhea that has been worse over the past week. She reports 2-4 episodes of watery, brown stools with associated sharp, diffuse abdominal pain. She has been wearing Depends because she unable to control her bowels while she's sleeping. She has been using Lomotil, but this is ineffective. Her oral intake is normal. She thinks this bout of diarrhea was set off by eating a piece of chocolate cake. She says the only foods that she can eat without getting diarrhea are baked chicken, toast, and apple sauce.   Assessment: The patient's diarrhea flare may be due to a viral illness (which is also causing her acute on chronic cough). However, diet may also be contributing as the diarrhea seems to be linked with certain foods that she eats. She had a colonoscopy in 2013 that was negative for IBD. She is able to eat gluten, so Celiac disease is unlikely. Her TSH was normal in 2016. Will check malabsorption labs.  Plan 1. Immodium 2. Iron panel, Vitamin D, Vitamin B12 3. Referral to nutritional services 4. Consider GI referral if symptoms do not improve  Addendum: Vitamin B12 wnl. Vitamin D low at 5.9. Patient has a history of Vitamin D deficiency. She has been prescribed supplements in the past, but has not been able to afford them. I spoke with the patient by phone to let her know of the test results and that she will need supplementation. She states she still cannot afford it. I also spoke with Dr. Maudie Mercury who plans to work with the patient to ensure she is able to get supplementation.

## 2018-08-03 NOTE — Assessment & Plan Note (Signed)
HPI: [name] [name] reports that she has had a chronic cough for years that flares intermittently. When it flares, she takes tessalon perles and it usually calms down. She reports that she has had a cough for about two weeks now and is out of the tessalon perles. The cough is nonproductive without hemoptysis. She reports associated runny nose and sore throat. When she has a coughing fit, she sometimes vomits and feels dizzy. She reports she has not smoked any cigarettes since her cough has worsened, previously she was smoking 2-3 cigarettes per day.   Assessment: this most likely represents an acute on chronic cough in the setting of a viral illness. She has no signs or symptoms concerning for pneumonia at this time. The differential for chronic cough also includes GERD, asthma/COPD, and ACE-I (she is on lisinopril). She had a CXR in 03/2018 which showed no masses or opacities, but the lungs appear somewhat hyperinflated and she has a smoking history.   Plan 1. Refill tessalon perles 2. Initiate 3 month trial of protonix 3. Albuterol rescue inhaler. Pt declines PFTs at this time; reconsider in future if she is amenable. 4. Discontinue lisinopril. Start irbesartan 150mg  QD.  5. F/u in one week for BP check and BMP after initiating ARB

## 2018-08-03 NOTE — Patient Instructions (Addendum)
It was a pleasure taking care of you today, [name].  For your cough: your cough is most likely caused by a viral illness, however because you have a history of chronic cough, we are considering other causes as well. These causes include asthma or other lung disease, acid reflux, and your lisinopril medication. To address each of these issues: - I have prescribed you Tessalon perles for cough. You can take 100mg  three times a day as needed for cough. - Stop taking your lisinopril. Instead, start taking Irbesartan 150mg  daily for blood pressure. You will need to follow-up in about one week for blood pressure check and lab work because you're starting a new medication. - I have prescribed you Protonix for possible acid reflux. Take 40mg  daily for three months. After three months, you can stop the Protonix and see if your cough comes back. - I have also prescribed an albuterol inhaler. You can take 2 puffs of this inhaler when you have a coughing fit.  For your diarrhea: it sounds like your diarrhea is related to what foods you eat. However, you may also be suffering from a viral illness that is causing both cough and diarrhea. - I have prescribed immodium (which is an oral medication) for your diarrhea. You can take 2mg  up to four times per day. - We will get labs today to ensure that you have enough vitamins and minerals - I have also made a referral to our dietician Butch Penny. She will contact you about making an appointment to take about your diet and nutrition.   This is a lot of changes for one day, so please feel free to call our clinic at [phone] if you have any questions.  Thanks, Dr. Annie Paras
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
## 2018-08-04 DIAGNOSIS — I10 Essential (primary) hypertension: Secondary | ICD-10-CM | POA: Diagnosis not present

## 2018-08-04 LAB — IRON AND TIBC
IRON SATURATION: 31 % (ref 15–55)
Iron: 52 ug/dL (ref 27–139)
Total Iron Binding Capacity: 168 ug/dL — ABNORMAL LOW (ref 250–450)
UIBC: 116 ug/dL — ABNORMAL LOW (ref 118–369)

## 2018-08-04 LAB — VITAMIN B12: Vitamin B-12: 334 pg/mL (ref 232–1245)

## 2018-08-04 LAB — FERRITIN: Ferritin: 444 ng/mL — ABNORMAL HIGH (ref 15–150)

## 2018-08-04 LAB — VITAMIN D 25 HYDROXY (VIT D DEFICIENCY, FRACTURES): VIT D 25 HYDROXY: 5.9 ng/mL — AB (ref 30.0–100.0)

## 2018-08-05 DIAGNOSIS — I10 Essential (primary) hypertension: Secondary | ICD-10-CM | POA: Diagnosis not present

## 2018-08-06 DIAGNOSIS — I10 Essential (primary) hypertension: Secondary | ICD-10-CM | POA: Diagnosis not present

## 2018-08-07 DIAGNOSIS — I10 Essential (primary) hypertension: Secondary | ICD-10-CM | POA: Diagnosis not present

## 2018-08-09 DIAGNOSIS — I10 Essential (primary) hypertension: Secondary | ICD-10-CM | POA: Diagnosis not present

## 2018-08-09 NOTE — Progress Notes (Signed)
Internal Medicine Clinic Attending  I saw and evaluated the patient.  I personally confirmed the key portions of the history and exam documented by Dr. Dorrell and I reviewed pertinent patient test results.  The assessment, diagnosis, and plan were formulated together and I agree with the documentation in the resident's note. 

## 2018-08-10 ENCOUNTER — Ambulatory Visit: Payer: Medicaid Other | Admitting: Dietician

## 2018-08-10 ENCOUNTER — Other Ambulatory Visit: Payer: Self-pay

## 2018-08-10 ENCOUNTER — Encounter: Payer: Self-pay | Admitting: Dietician

## 2018-08-10 ENCOUNTER — Encounter: Payer: Self-pay | Admitting: Internal Medicine

## 2018-08-10 ENCOUNTER — Ambulatory Visit: Payer: Medicaid Other | Admitting: Pharmacist

## 2018-08-10 ENCOUNTER — Ambulatory Visit: Payer: Medicaid Other | Admitting: Internal Medicine

## 2018-08-10 VITALS — BP 153/53 | HR 62 | Temp 99.4°F | Ht 62.0 in | Wt 127.3 lb

## 2018-08-10 DIAGNOSIS — Z89512 Acquired absence of left leg below knee: Secondary | ICD-10-CM

## 2018-08-10 DIAGNOSIS — R053 Chronic cough: Secondary | ICD-10-CM

## 2018-08-10 DIAGNOSIS — I129 Hypertensive chronic kidney disease with stage 1 through stage 4 chronic kidney disease, or unspecified chronic kidney disease: Secondary | ICD-10-CM | POA: Diagnosis not present

## 2018-08-10 DIAGNOSIS — E1151 Type 2 diabetes mellitus with diabetic peripheral angiopathy without gangrene: Secondary | ICD-10-CM

## 2018-08-10 DIAGNOSIS — Z79899 Other long term (current) drug therapy: Secondary | ICD-10-CM | POA: Diagnosis not present

## 2018-08-10 DIAGNOSIS — K529 Noninfective gastroenteritis and colitis, unspecified: Secondary | ICD-10-CM

## 2018-08-10 DIAGNOSIS — E559 Vitamin D deficiency, unspecified: Secondary | ICD-10-CM

## 2018-08-10 DIAGNOSIS — Z7689 Persons encountering health services in other specified circumstances: Secondary | ICD-10-CM | POA: Diagnosis not present

## 2018-08-10 DIAGNOSIS — E1122 Type 2 diabetes mellitus with diabetic chronic kidney disease: Secondary | ICD-10-CM | POA: Diagnosis not present

## 2018-08-10 DIAGNOSIS — Z8719 Personal history of other diseases of the digestive system: Secondary | ICD-10-CM

## 2018-08-10 DIAGNOSIS — R05 Cough: Secondary | ICD-10-CM | POA: Diagnosis not present

## 2018-08-10 DIAGNOSIS — N184 Chronic kidney disease, stage 4 (severe): Secondary | ICD-10-CM

## 2018-08-10 DIAGNOSIS — I2510 Atherosclerotic heart disease of native coronary artery without angina pectoris: Secondary | ICD-10-CM

## 2018-08-10 DIAGNOSIS — I10 Essential (primary) hypertension: Secondary | ICD-10-CM | POA: Diagnosis not present

## 2018-08-10 DIAGNOSIS — Z950 Presence of cardiac pacemaker: Secondary | ICD-10-CM

## 2018-08-10 MED ORDER — VALSARTAN 80 MG PO TABS: 80.0000 mg | ORAL_TABLET | Freq: Every day | ORAL | 11 refills | Status: DC

## 2018-08-10 MED ORDER — VALSARTAN 40 MG PO TABS: 40.0000 mg | ORAL_TABLET | Freq: Two times a day (BID) | ORAL | 1 refills | Status: DC

## 2018-08-10 NOTE — Progress Notes (Signed)
Internal Medicine Clinic Attending  I saw and evaluated the patient.  I personally confirmed the key portions of the history and exam documented by Dr. Dorrell and I reviewed pertinent patient test results.  The assessment, diagnosis, and plan were formulated together and I agree with the documentation in the resident's note. 

## 2018-08-10 NOTE — Assessment & Plan Note (Signed)
The patient reports her diarrhea has eased since her last visit. She did not receive the imodium from her pharmacy as it is not covered by Medicaid and she will need to pay for the medication before delivery.   Plan 1. Imodium PRN
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
## 2018-08-10 NOTE — Patient Instructions (Addendum)
[name] [name]  For your blood pressure - stop taking lisinopril (which is probably causing your cough) - start taking valsartan 80mg  once a day when you get it from your pharmacy - after you start this medication, you need to come back to the clinic so we can check your blood pressure and some labs  - please call us if you do not get this new blood pressure medication

## 2018-08-10 NOTE — Progress Notes (Signed)
She did not want to stay for her visit today. She agrees to see dietitian same day as her next doctor appointment.

## 2018-08-10 NOTE — Assessment & Plan Note (Addendum)
[name] [name] reports there has been no change in her cough. She has been using the albuterol inhaler during her coughing episodes without any relief. She continues to take the lisinopril as her pharmacy has not sent the irbesartan to her house yet. She also hasn't received the tessalon perles or protonix.   Assessment: I advised [name] [name] that she can stop using the albuterol if it doesn't seem to help her cough. I called her pharmacy to inquire about the medications prescribed at her last visit 1 week ago. She needs a PA for the irbesartan, so I will switch that to valsartan (which does not require a PA and is covered by her insurance). Medicaid will not cover the tessalon perles as they do not cover any anti-tussives. The pharmacy estimates this will cost her about $23. She will need to call the pharmacy and agree to pay before they send her this medication. The patient was unaware that she needs to contact the pharmacy. The pharmacy states her protonix is also covered and should be sent to her soon.   Plan 1. Discontinue lisinopril 2. Start valsartan 80mg  daily (instead of irbesartan) 3. Discontinue albuterol 4. Start protonix (once arrives from the pharmacy) 5. Start tessalon perles (patient to see Dr. Maudie Mercury about petty cash after this visit) 6. F/u in 2 weeks for BP check and BMET after starting valsartan

## 2018-08-10 NOTE — Assessment & Plan Note (Addendum)
Vitamin D level low at 5.9. The patient has a history of Vitamin D insufficiency without supplementation due to cost. 100 capsules of Vitamin D3 5,000 costs $2.21 at the outpatient pharmacy. However, the patient has no transportation to the pharmacy. She does have an aid who grocery shops for her. Advised the patient to have her aid buy OTC Vitamin D3 from the grocery store. The patient will meet with Dr. Maudie Mercury today to discuss medication affordability.

## 2018-08-11 DIAGNOSIS — I10 Essential (primary) hypertension: Secondary | ICD-10-CM | POA: Diagnosis not present

## 2018-08-12 DIAGNOSIS — I10 Essential (primary) hypertension: Secondary | ICD-10-CM | POA: Diagnosis not present

## 2018-08-13 DIAGNOSIS — I10 Essential (primary) hypertension: Secondary | ICD-10-CM | POA: Diagnosis not present

## 2018-08-14 DIAGNOSIS — I10 Essential (primary) hypertension: Secondary | ICD-10-CM | POA: Diagnosis not present

## 2018-08-15 DIAGNOSIS — I10 Essential (primary) hypertension: Secondary | ICD-10-CM | POA: Diagnosis not present

## 2018-08-16 DIAGNOSIS — I10 Essential (primary) hypertension: Secondary | ICD-10-CM | POA: Diagnosis not present

## 2018-08-17 DIAGNOSIS — I10 Essential (primary) hypertension: Secondary | ICD-10-CM | POA: Diagnosis not present

## 2018-08-18 ENCOUNTER — Telehealth: Payer: Self-pay | Admitting: Internal Medicine

## 2018-08-18 DIAGNOSIS — I10 Essential (primary) hypertension: Secondary | ICD-10-CM | POA: Diagnosis not present

## 2018-08-18 DIAGNOSIS — Z89519 Acquired absence of unspecified leg below knee: Secondary | ICD-10-CM

## 2018-08-18 NOTE — Telephone Encounter (Signed)
Pt is requesting a DME order for a new Prosthesis from the [facility] in [place]. Please advise.

## 2018-08-19 DIAGNOSIS — I10 Essential (primary) hypertension: Secondary | ICD-10-CM | POA: Diagnosis not present

## 2018-08-20 DIAGNOSIS — I10 Essential (primary) hypertension: Secondary | ICD-10-CM | POA: Diagnosis not present

## 2018-08-20 LAB — CUP PACEART REMOTE DEVICE CHECK
Battery Remaining Longevity: 5 mo
Brady Statistic AP VP Percent: 98 %
Brady Statistic AP VS Percent: 1 %
Brady Statistic AS VP Percent: 1.9 %
Brady Statistic AS VS Percent: 1 %
Brady Statistic RV Percent Paced: 99 %
Implantable Lead Location: 753859
Lead Channel Impedance Value: 380 Ohm
Lead Channel Pacing Threshold Amplitude: 0.75 V
Lead Channel Pacing Threshold Pulse Width: 0.4 ms
Lead Channel Sensing Intrinsic Amplitude: 12 mV
Lead Channel Setting Pacing Amplitude: 2 V
Lead Channel Setting Pacing Amplitude: 2.5 V
Lead Channel Setting Pacing Pulse Width: 0.4 ms
MDC IDC LEAD IMPLANT DT: 20110225
MDC IDC LEAD IMPLANT DT: 20110225
MDC IDC LEAD LOCATION: 753860
MDC IDC MSMT BATTERY REMAINING PERCENTAGE: 5 %
MDC IDC MSMT BATTERY VOLTAGE: 2.66 V
MDC IDC MSMT LEADCHNL RA PACING THRESHOLD AMPLITUDE: 0.75 V
MDC IDC MSMT LEADCHNL RA PACING THRESHOLD PULSEWIDTH: 0.4 ms
MDC IDC MSMT LEADCHNL RA SENSING INTR AMPL: 1 mV
MDC IDC MSMT LEADCHNL RV IMPEDANCE VALUE: 390 Ohm
MDC IDC PG IMPLANT DT: 20110225
MDC IDC SESS DTM: 20191007154321
MDC IDC SET LEADCHNL RV SENSING SENSITIVITY: 4 mV
MDC IDC STAT BRADY RA PERCENT PACED: 98 %
Pulse Gen Model: 2210
Pulse Gen Serial Number: 7114075

## 2018-08-20 NOTE — Telephone Encounter (Signed)
DME order has been placed.

## 2018-08-21 DIAGNOSIS — I10 Essential (primary) hypertension: Secondary | ICD-10-CM | POA: Diagnosis not present

## 2018-08-22 DIAGNOSIS — I10 Essential (primary) hypertension: Secondary | ICD-10-CM | POA: Diagnosis not present

## 2018-08-23 DIAGNOSIS — I10 Essential (primary) hypertension: Secondary | ICD-10-CM | POA: Diagnosis not present

## 2018-08-23 NOTE — Telephone Encounter (Signed)
[facility]. The patient may contact their office @ [phone] if any questions.

## 2018-08-24 ENCOUNTER — Ambulatory Visit: Payer: Medicaid Other | Admitting: Internal Medicine

## 2018-08-24 ENCOUNTER — Other Ambulatory Visit: Payer: Self-pay

## 2018-08-24 VITALS — BP 113/71 | HR 60 | Temp 98.1°F | Ht 62.0 in | Wt 124.1 lb

## 2018-08-24 DIAGNOSIS — N184 Chronic kidney disease, stage 4 (severe): Secondary | ICD-10-CM

## 2018-08-24 DIAGNOSIS — I2510 Atherosclerotic heart disease of native coronary artery without angina pectoris: Secondary | ICD-10-CM | POA: Diagnosis not present

## 2018-08-24 DIAGNOSIS — I129 Hypertensive chronic kidney disease with stage 1 through stage 4 chronic kidney disease, or unspecified chronic kidney disease: Secondary | ICD-10-CM | POA: Diagnosis not present

## 2018-08-24 DIAGNOSIS — Z79899 Other long term (current) drug therapy: Secondary | ICD-10-CM | POA: Diagnosis not present

## 2018-08-24 DIAGNOSIS — E1122 Type 2 diabetes mellitus with diabetic chronic kidney disease: Secondary | ICD-10-CM

## 2018-08-24 DIAGNOSIS — R05 Cough: Secondary | ICD-10-CM | POA: Diagnosis not present

## 2018-08-24 DIAGNOSIS — E1151 Type 2 diabetes mellitus with diabetic peripheral angiopathy without gangrene: Secondary | ICD-10-CM | POA: Diagnosis not present

## 2018-08-24 DIAGNOSIS — Z950 Presence of cardiac pacemaker: Secondary | ICD-10-CM | POA: Diagnosis not present

## 2018-08-24 DIAGNOSIS — Z89512 Acquired absence of left leg below knee: Secondary | ICD-10-CM

## 2018-08-24 DIAGNOSIS — R053 Chronic cough: Secondary | ICD-10-CM

## 2018-08-24 DIAGNOSIS — I10 Essential (primary) hypertension: Secondary | ICD-10-CM | POA: Diagnosis not present

## 2018-08-24 DIAGNOSIS — Z7689 Persons encountering health services in other specified circumstances: Secondary | ICD-10-CM | POA: Diagnosis not present

## 2018-08-24 NOTE — Patient Instructions (Signed)
It was a pleasure taking care of you today, [name] [name].  For your blood pressure - No changes to your medications today because your blood pressure is so much better. - We will get lab work to check your electrolytes and kidney function since you started a new medication recently - You should follow-up with your PCP in 3 months  For your cough - This should improve with time if it was related to the lisinopril  - Please call us if your cough is not better in one month. At that time we may need to do a chest xray.   Feel free to call our clinic at [phone] if you have any questions.  Thanks, Dr. Annie Paras

## 2018-08-24 NOTE — Assessment & Plan Note (Signed)
HPI: Patient reports that her cough has not improved. She made the switch from lisinopril to valsartan about 2 weeks ago. She has not been able to get the tessalon perles because they are not covered by her insurance. She is unsure whether she is taking protonix and she did not bring her medications with her today.   Assessment/Plan: If chronic cough is due to ACE-I, the cough may take weeks to months to fully resolve. I advised the patient to look at her medications at home to see if she is on protonix to rule out GERD as the cause of chronic cough. I also advised the patient to follow-up in 1 month if her cough has not improved. May consider CXR at that time for ongoing cough.
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
## 2018-08-24 NOTE — Assessment & Plan Note (Addendum)
BP well-controlled today at 113/71. Patient reports that she has made the switch from lisinopril to valsartan as previously recommended. She denies any dizziness or syncope. She feels well.   Plan 1. Continue current regimen of Valsartan 80mg  daily, Coreg 12.5mg  BID, Lasix 40mg  daily, and Bidil 20-37.5mg  daily 2. BMP today  3. F/u with PCP in 3 months for BP check

## 2018-08-25 DIAGNOSIS — I10 Essential (primary) hypertension: Secondary | ICD-10-CM | POA: Diagnosis not present

## 2018-08-25 LAB — BMP8+ANION GAP
Anion Gap: 17 mmol/L (ref 10.0–18.0)
BUN / CREAT RATIO: 16 (ref 12–28)
BUN: 38 mg/dL — AB (ref 8–27)
CO2: 16 mmol/L — ABNORMAL LOW (ref 20–29)
CREATININE: 2.42 mg/dL — AB (ref 0.57–1.00)
Calcium: 9.1 mg/dL (ref 8.7–10.3)
Chloride: 110 mmol/L — ABNORMAL HIGH (ref 96–106)
GFR calc non Af Amer: 20 mL/min/{1.73_m2} — ABNORMAL LOW (ref >59)
GFR, EST AFRICAN AMERICAN: 23 mL/min/{1.73_m2} — AB (ref >59)
GLUCOSE: 115 mg/dL — AB (ref 65–99)
Potassium: 4.2 mmol/L (ref 3.5–5.2)
SODIUM: 143 mmol/L (ref 134–144)

## 2018-08-25 NOTE — Progress Notes (Signed)
Internal Medicine Clinic Attending  I saw and evaluated the patient.  I personally confirmed the key portions of the history and exam documented by Dr. Annie Paras and I reviewed pertinent patient test results.  The assessment, diagnosis, and plan were formulated together and I agree with the documentation in the resident's note.    No coughing from patient during exam lungs CTAB.  Difficult to ascertain if cough improved, patient is irritated states "I like all my doctors except coming here [facility])," only desire is to get tessalon pearls filled.

## 2018-08-27 DIAGNOSIS — I10 Essential (primary) hypertension: Secondary | ICD-10-CM | POA: Diagnosis not present

## 2018-08-28 DIAGNOSIS — I10 Essential (primary) hypertension: Secondary | ICD-10-CM | POA: Diagnosis not present

## 2018-08-29 DIAGNOSIS — I10 Essential (primary) hypertension: Secondary | ICD-10-CM | POA: Diagnosis not present

## 2018-08-30 ENCOUNTER — Other Ambulatory Visit: Payer: Self-pay | Admitting: Internal Medicine

## 2018-08-30 DIAGNOSIS — M7502 Adhesive capsulitis of left shoulder: Secondary | ICD-10-CM

## 2018-08-30 DIAGNOSIS — I10 Essential (primary) hypertension: Secondary | ICD-10-CM | POA: Diagnosis not present

## 2018-08-31 DIAGNOSIS — I10 Essential (primary) hypertension: Secondary | ICD-10-CM | POA: Diagnosis not present

## 2018-08-31 NOTE — Telephone Encounter (Signed)
Frequency decreased from TID to BID based on patient's worsening renal function

## 2018-09-01 ENCOUNTER — Other Ambulatory Visit: Payer: Self-pay | Admitting: Internal Medicine

## 2018-09-01 ENCOUNTER — Telehealth: Payer: Self-pay | Admitting: *Deleted

## 2018-09-01 DIAGNOSIS — I10 Essential (primary) hypertension: Secondary | ICD-10-CM | POA: Diagnosis not present

## 2018-09-01 DIAGNOSIS — I429 Cardiomyopathy, unspecified: Secondary | ICD-10-CM

## 2018-09-01 NOTE — Telephone Encounter (Signed)
Call to [employer] for PA for Diclofenac Gel 1%.  Approved  09/01/2018 thru 10/01/2018. [place] [id number].  I [id number].  [facility] was called and notified of.    Sander Nephew, RN 09/01/2018 9:30 AM

## 2018-09-01 NOTE — Telephone Encounter (Signed)
Next appt scheduled 11/25/18 with PCP.

## 2018-09-02 DIAGNOSIS — I10 Essential (primary) hypertension: Secondary | ICD-10-CM | POA: Diagnosis not present

## 2018-09-03 DIAGNOSIS — I10 Essential (primary) hypertension: Secondary | ICD-10-CM | POA: Diagnosis not present

## 2018-09-04 DIAGNOSIS — I10 Essential (primary) hypertension: Secondary | ICD-10-CM | POA: Diagnosis not present

## 2018-09-05 DIAGNOSIS — I10 Essential (primary) hypertension: Secondary | ICD-10-CM | POA: Diagnosis not present

## 2018-09-06 DIAGNOSIS — I10 Essential (primary) hypertension: Secondary | ICD-10-CM | POA: Diagnosis not present

## 2018-09-07 ENCOUNTER — Other Ambulatory Visit: Payer: Self-pay | Admitting: Internal Medicine

## 2018-09-07 DIAGNOSIS — N2581 Secondary hyperparathyroidism of renal origin: Secondary | ICD-10-CM | POA: Diagnosis not present

## 2018-09-07 DIAGNOSIS — E785 Hyperlipidemia, unspecified: Secondary | ICD-10-CM | POA: Diagnosis not present

## 2018-09-07 DIAGNOSIS — I10 Essential (primary) hypertension: Secondary | ICD-10-CM | POA: Diagnosis not present

## 2018-09-07 DIAGNOSIS — I429 Cardiomyopathy, unspecified: Secondary | ICD-10-CM

## 2018-09-07 DIAGNOSIS — N184 Chronic kidney disease, stage 4 (severe): Secondary | ICD-10-CM | POA: Diagnosis not present

## 2018-09-07 DIAGNOSIS — Z7689 Persons encountering health services in other specified circumstances: Secondary | ICD-10-CM | POA: Diagnosis not present

## 2018-09-08 ENCOUNTER — Other Ambulatory Visit: Payer: Self-pay | Admitting: Internal Medicine

## 2018-09-08 DIAGNOSIS — I10 Essential (primary) hypertension: Secondary | ICD-10-CM | POA: Diagnosis not present

## 2018-09-09 DIAGNOSIS — I10 Essential (primary) hypertension: Secondary | ICD-10-CM | POA: Diagnosis not present

## 2018-09-10 DIAGNOSIS — I10 Essential (primary) hypertension: Secondary | ICD-10-CM | POA: Diagnosis not present

## 2018-09-11 DIAGNOSIS — I10 Essential (primary) hypertension: Secondary | ICD-10-CM | POA: Diagnosis not present

## 2018-09-12 DIAGNOSIS — I10 Essential (primary) hypertension: Secondary | ICD-10-CM | POA: Diagnosis not present

## 2018-09-13 ENCOUNTER — Other Ambulatory Visit: Payer: Self-pay | Admitting: Internal Medicine

## 2018-09-13 DIAGNOSIS — I429 Cardiomyopathy, unspecified: Secondary | ICD-10-CM

## 2018-09-13 DIAGNOSIS — I10 Essential (primary) hypertension: Secondary | ICD-10-CM | POA: Diagnosis not present

## 2018-09-14 ENCOUNTER — Ambulatory Visit (INDEPENDENT_AMBULATORY_CARE_PROVIDER_SITE_OTHER): Payer: Medicaid Other | Admitting: Orthopedic Surgery

## 2018-09-14 DIAGNOSIS — Z7689 Persons encountering health services in other specified circumstances: Secondary | ICD-10-CM | POA: Diagnosis not present

## 2018-09-15 DIAGNOSIS — I10 Essential (primary) hypertension: Secondary | ICD-10-CM | POA: Diagnosis not present

## 2018-09-16 ENCOUNTER — Ambulatory Visit (INDEPENDENT_AMBULATORY_CARE_PROVIDER_SITE_OTHER): Payer: Medicaid Other | Admitting: Orthopedic Surgery

## 2018-09-16 DIAGNOSIS — I10 Essential (primary) hypertension: Secondary | ICD-10-CM | POA: Diagnosis not present

## 2018-09-17 DIAGNOSIS — I10 Essential (primary) hypertension: Secondary | ICD-10-CM | POA: Diagnosis not present

## 2018-09-18 DIAGNOSIS — I10 Essential (primary) hypertension: Secondary | ICD-10-CM | POA: Diagnosis not present

## 2018-09-19 DIAGNOSIS — I10 Essential (primary) hypertension: Secondary | ICD-10-CM | POA: Diagnosis not present

## 2018-09-20 DIAGNOSIS — I10 Essential (primary) hypertension: Secondary | ICD-10-CM | POA: Diagnosis not present

## 2018-09-21 DIAGNOSIS — I10 Essential (primary) hypertension: Secondary | ICD-10-CM | POA: Diagnosis not present

## 2018-09-23 DIAGNOSIS — I10 Essential (primary) hypertension: Secondary | ICD-10-CM | POA: Diagnosis not present

## 2018-09-24 DIAGNOSIS — I10 Essential (primary) hypertension: Secondary | ICD-10-CM | POA: Diagnosis not present

## 2018-09-25 DIAGNOSIS — I10 Essential (primary) hypertension: Secondary | ICD-10-CM | POA: Diagnosis not present

## 2018-09-26 DIAGNOSIS — I10 Essential (primary) hypertension: Secondary | ICD-10-CM | POA: Diagnosis not present

## 2018-09-27 ENCOUNTER — Other Ambulatory Visit: Payer: Self-pay | Admitting: Internal Medicine

## 2018-09-27 ENCOUNTER — Encounter (INDEPENDENT_AMBULATORY_CARE_PROVIDER_SITE_OTHER): Payer: Self-pay | Admitting: Orthopedic Surgery

## 2018-09-27 ENCOUNTER — Ambulatory Visit (INDEPENDENT_AMBULATORY_CARE_PROVIDER_SITE_OTHER): Payer: Medicaid Other | Admitting: Orthopedic Surgery

## 2018-09-27 DIAGNOSIS — Z89512 Acquired absence of left leg below knee: Secondary | ICD-10-CM

## 2018-09-27 DIAGNOSIS — I10 Essential (primary) hypertension: Secondary | ICD-10-CM | POA: Diagnosis not present

## 2018-09-27 DIAGNOSIS — M79605 Pain in left leg: Secondary | ICD-10-CM

## 2018-09-27 DIAGNOSIS — Z7689 Persons encountering health services in other specified circumstances: Secondary | ICD-10-CM | POA: Diagnosis not present

## 2018-09-27 DIAGNOSIS — S88112A Complete traumatic amputation at level between knee and ankle, left lower leg, initial encounter: Secondary | ICD-10-CM
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
## 2018-09-28 DIAGNOSIS — I10 Essential (primary) hypertension: Secondary | ICD-10-CM | POA: Diagnosis not present

## 2018-09-29 DIAGNOSIS — I10 Essential (primary) hypertension: Secondary | ICD-10-CM | POA: Diagnosis not present

## 2018-09-30 DIAGNOSIS — I10 Essential (primary) hypertension: Secondary | ICD-10-CM | POA: Diagnosis not present

## 2018-10-01 DIAGNOSIS — I10 Essential (primary) hypertension: Secondary | ICD-10-CM | POA: Diagnosis not present

## 2018-10-02 DIAGNOSIS — I10 Essential (primary) hypertension: Secondary | ICD-10-CM | POA: Diagnosis not present

## 2018-10-03 DIAGNOSIS — I10 Essential (primary) hypertension: Secondary | ICD-10-CM | POA: Diagnosis not present

## 2018-10-04 ENCOUNTER — Ambulatory Visit (INDEPENDENT_AMBULATORY_CARE_PROVIDER_SITE_OTHER): Payer: Medicaid Other

## 2018-10-04 DIAGNOSIS — I495 Sick sinus syndrome: Secondary | ICD-10-CM

## 2018-10-04 DIAGNOSIS — H401133 Primary open-angle glaucoma, bilateral, severe stage: Secondary | ICD-10-CM | POA: Diagnosis not present

## 2018-10-04 DIAGNOSIS — Z961 Presence of intraocular lens: Secondary | ICD-10-CM | POA: Diagnosis not present

## 2018-10-04 DIAGNOSIS — I10 Essential (primary) hypertension: Secondary | ICD-10-CM | POA: Diagnosis not present

## 2018-10-04 DIAGNOSIS — H25811 Combined forms of age-related cataract, right eye: Secondary | ICD-10-CM | POA: Diagnosis not present

## 2018-10-04 DIAGNOSIS — Z7689 Persons encountering health services in other specified circumstances: Secondary | ICD-10-CM | POA: Diagnosis not present

## 2018-10-05 DIAGNOSIS — I10 Essential (primary) hypertension: Secondary | ICD-10-CM | POA: Diagnosis not present

## 2018-10-05 LAB — CUP PACEART REMOTE DEVICE CHECK
Battery Voltage: 2.6 V
Brady Statistic AP VP Percent: 97 %
Brady Statistic AP VS Percent: 1 %
Brady Statistic AS VP Percent: 3.4 %
Brady Statistic AS VS Percent: 1 %
Brady Statistic RA Percent Paced: 96 %
Brady Statistic RV Percent Paced: 99 %
Date Time Interrogation Session: 20200106101412
Implantable Lead Implant Date: 20110225
Implantable Lead Implant Date: 20110225
Implantable Lead Location: 753859
Implantable Lead Location: 753860
Lead Channel Impedance Value: 380 Ohm
Lead Channel Impedance Value: 430 Ohm
Lead Channel Pacing Threshold Amplitude: 0.75 V
Lead Channel Pacing Threshold Amplitude: 0.75 V
Lead Channel Pacing Threshold Pulse Width: 0.4 ms
Lead Channel Pacing Threshold Pulse Width: 0.4 ms
Lead Channel Sensing Intrinsic Amplitude: 12 mV
Lead Channel Setting Pacing Amplitude: 2 V
Lead Channel Setting Pacing Amplitude: 2.5 V
Lead Channel Setting Sensing Sensitivity: 4 mV
MDC IDC MSMT BATTERY REMAINING LONGEVITY: 1 mo
MDC IDC MSMT BATTERY REMAINING PERCENTAGE: 0.5 %
MDC IDC MSMT LEADCHNL RA SENSING INTR AMPL: 1.6 mV
MDC IDC PG IMPLANT DT: 20110225
MDC IDC SET LEADCHNL RV PACING PULSEWIDTH: 0.4 ms
Pulse Gen Serial Number: 7114075

## 2018-10-05 NOTE — Progress Notes (Signed)
Remote pacemaker transmission.   

## 2018-10-06 ENCOUNTER — Ambulatory Visit (INDEPENDENT_AMBULATORY_CARE_PROVIDER_SITE_OTHER): Payer: Medicaid Other | Admitting: Orthopedic Surgery

## 2018-10-06 ENCOUNTER — Telehealth (INDEPENDENT_AMBULATORY_CARE_PROVIDER_SITE_OTHER): Payer: Self-pay | Admitting: Orthopedic Surgery

## 2018-10-06 DIAGNOSIS — Z7689 Persons encountering health services in other specified circumstances: Secondary | ICD-10-CM | POA: Diagnosis not present

## 2018-10-06 DIAGNOSIS — I10 Essential (primary) hypertension: Secondary | ICD-10-CM | POA: Diagnosis not present

## 2018-10-06 NOTE — Telephone Encounter (Signed)
Last office visit faxed to [facility] to advise of changed and modifications to the left BKA

## 2018-10-06 NOTE — Telephone Encounter (Signed)
Patient called advised she lost her Rx for [facility]. Patient asked if the Rx could be faxed over to [facility] for her prosthetic leg. The number to contact patient is [phone]

## 2018-10-07 DIAGNOSIS — I10 Essential (primary) hypertension: Secondary | ICD-10-CM | POA: Diagnosis not present

## 2018-10-08 DIAGNOSIS — I10 Essential (primary) hypertension: Secondary | ICD-10-CM | POA: Diagnosis not present

## 2018-10-09 DIAGNOSIS — I10 Essential (primary) hypertension: Secondary | ICD-10-CM | POA: Diagnosis not present

## 2018-10-10 DIAGNOSIS — I10 Essential (primary) hypertension: Secondary | ICD-10-CM | POA: Diagnosis not present

## 2018-10-11 DIAGNOSIS — I10 Essential (primary) hypertension: Secondary | ICD-10-CM | POA: Diagnosis not present

## 2018-10-12 DIAGNOSIS — Z7689 Persons encountering health services in other specified circumstances: Secondary | ICD-10-CM | POA: Diagnosis not present

## 2018-10-12 DIAGNOSIS — I10 Essential (primary) hypertension: Secondary | ICD-10-CM | POA: Diagnosis not present

## 2018-10-13 DIAGNOSIS — I10 Essential (primary) hypertension: Secondary | ICD-10-CM | POA: Diagnosis not present

## 2018-10-14 DIAGNOSIS — I10 Essential (primary) hypertension: Secondary | ICD-10-CM | POA: Diagnosis not present

## 2018-10-15 DIAGNOSIS — I10 Essential (primary) hypertension: Secondary | ICD-10-CM | POA: Diagnosis not present

## 2018-10-16 DIAGNOSIS — I10 Essential (primary) hypertension: Secondary | ICD-10-CM | POA: Diagnosis not present

## 2018-10-17 DIAGNOSIS — I10 Essential (primary) hypertension: Secondary | ICD-10-CM | POA: Diagnosis not present

## 2018-10-18 DIAGNOSIS — I10 Essential (primary) hypertension: Secondary | ICD-10-CM | POA: Diagnosis not present

## 2018-10-19 ENCOUNTER — Other Ambulatory Visit: Payer: Self-pay | Admitting: Internal Medicine

## 2018-10-19 DIAGNOSIS — M7502 Adhesive capsulitis of left shoulder: Secondary | ICD-10-CM

## 2018-10-19 DIAGNOSIS — I10 Essential (primary) hypertension: Secondary | ICD-10-CM | POA: Diagnosis not present

## 2018-10-20 DIAGNOSIS — I10 Essential (primary) hypertension: Secondary | ICD-10-CM | POA: Diagnosis not present

## 2018-10-21 DIAGNOSIS — I10 Essential (primary) hypertension: Secondary | ICD-10-CM | POA: Diagnosis not present

## 2018-10-22 ENCOUNTER — Other Ambulatory Visit: Payer: Self-pay | Admitting: Internal Medicine

## 2018-10-22 DIAGNOSIS — I429 Cardiomyopathy, unspecified: Secondary | ICD-10-CM

## 2018-10-22 DIAGNOSIS — I10 Essential (primary) hypertension: Secondary | ICD-10-CM | POA: Diagnosis not present

## 2018-10-23 DIAGNOSIS — I10 Essential (primary) hypertension: Secondary | ICD-10-CM | POA: Diagnosis not present

## 2018-10-24 DIAGNOSIS — I10 Essential (primary) hypertension: Secondary | ICD-10-CM | POA: Diagnosis not present

## 2018-10-25 ENCOUNTER — Other Ambulatory Visit: Payer: Self-pay | Admitting: Internal Medicine

## 2018-10-25 DIAGNOSIS — I10 Essential (primary) hypertension: Secondary | ICD-10-CM | POA: Diagnosis not present

## 2018-10-25 DIAGNOSIS — M7502 Adhesive capsulitis of left shoulder: Secondary | ICD-10-CM

## 2018-10-25 NOTE — Telephone Encounter (Signed)
Lisinopril no longer on list (stopped 08/03/18 visit) and voltaren gel should have refills left on rx. Will send to pcp for review, please advise.Despina Hidden Cassady1/27/202012:21 PM

## 2018-10-26 DIAGNOSIS — I10 Essential (primary) hypertension: Secondary | ICD-10-CM | POA: Diagnosis not present

## 2018-10-27 DIAGNOSIS — I10 Essential (primary) hypertension: Secondary | ICD-10-CM | POA: Diagnosis not present

## 2018-10-28 ENCOUNTER — Other Ambulatory Visit: Payer: Self-pay | Admitting: Internal Medicine

## 2018-10-28 DIAGNOSIS — I10 Essential (primary) hypertension: Secondary | ICD-10-CM | POA: Diagnosis not present

## 2018-10-29 DIAGNOSIS — I10 Essential (primary) hypertension: Secondary | ICD-10-CM | POA: Diagnosis not present

## 2018-10-30 DIAGNOSIS — I10 Essential (primary) hypertension: Secondary | ICD-10-CM | POA: Diagnosis not present

## 2018-10-31 DIAGNOSIS — I10 Essential (primary) hypertension: Secondary | ICD-10-CM | POA: Diagnosis not present

## 2018-11-01 DIAGNOSIS — I10 Essential (primary) hypertension: Secondary | ICD-10-CM | POA: Diagnosis not present

## 2018-11-02 DIAGNOSIS — I10 Essential (primary) hypertension: Secondary | ICD-10-CM | POA: Diagnosis not present
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
## 2018-11-03 ENCOUNTER — Other Ambulatory Visit: Payer: Self-pay | Admitting: Internal Medicine

## 2018-11-03 ENCOUNTER — Encounter: Payer: Self-pay | Admitting: Internal Medicine

## 2018-11-03 ENCOUNTER — Ambulatory Visit: Payer: Medicaid Other | Admitting: Internal Medicine

## 2018-11-03 ENCOUNTER — Other Ambulatory Visit: Payer: Self-pay

## 2018-11-03 VITALS — BP 166/85 | HR 62 | Temp 98.5°F | Wt 125.7 lb

## 2018-11-03 DIAGNOSIS — I10 Essential (primary) hypertension: Secondary | ICD-10-CM | POA: Diagnosis not present

## 2018-11-03 DIAGNOSIS — Z7689 Persons encountering health services in other specified circumstances: Secondary | ICD-10-CM | POA: Diagnosis not present

## 2018-11-03 DIAGNOSIS — K529 Noninfective gastroenteritis and colitis, unspecified: Secondary | ICD-10-CM | POA: Diagnosis not present

## 2018-11-03 DIAGNOSIS — M7502 Adhesive capsulitis of left shoulder: Secondary | ICD-10-CM

## 2018-11-03 DIAGNOSIS — Z79899 Other long term (current) drug therapy: Secondary | ICD-10-CM

## 2018-11-03 DIAGNOSIS — I429 Cardiomyopathy, unspecified: Secondary | ICD-10-CM

## 2018-11-03 DIAGNOSIS — Z96612 Presence of left artificial shoulder joint: Secondary | ICD-10-CM | POA: Diagnosis not present

## 2018-11-03 MED ORDER — ACETAMINOPHEN 500 MG PO TABS: 1000.0000 mg | ORAL_TABLET | Freq: Four times a day (QID) | ORAL | 2 refills | Status: AC | PRN

## 2018-11-03 MED ORDER — VALSARTAN 80 MG PO TABS: 160.0000 mg | ORAL_TABLET | Freq: Every day | ORAL | 11 refills | Status: DC

## 2018-11-03 MED ORDER — LOPERAMIDE HCL 2 MG PO TABS: 2.0000 mg | ORAL_TABLET | Freq: Four times a day (QID) | ORAL | 4 refills | Status: DC | PRN

## 2018-11-03 NOTE — Patient Instructions (Signed)
It was nice seeing you today. Thank you for choosing [facility] for your Primary Care.   Today we talked about:  1) Shoulder Pain: I have sent extra strength tylenol to [facility] 2) High blood pressure: Please take two tablets of Valsartan for a total of 160mg  every day. You can follow up with your kidney doctor next month and she can further adjust your blood pressure medication if needed.  3) I sent more diarrhea medicine to [place] [facility] When: 6 months For: BP What to bring: all medications  Please contact the clinic if you have any problems, or need to be seen sooner.

## 2018-11-03 NOTE — Assessment & Plan Note (Addendum)
BP Readings from Last 3 Encounters:  11/03/18 (!) 166/85  08/24/18 113/71  08/10/18 (!) 153/53    Uncontrolled. Switched from lisinopril to valsartan at last visit for concerns of ACEi cough. The cough has now resolved. She is tolerating valsartan well and brought all medications to her visit. She still had lisinopril in her medication bag and thinks she has been taking it. I will dispose of this for her. Denies chest pain, sob, orthopnea.   Plan: - increase valsartan from 80 mg to 160 mg daily  - f/u one month with nephrology

## 2018-11-03 NOTE — Assessment & Plan Note (Signed)
Reports intermittent diarrhea with associated lower abdominal pain since the 1990s. Takes loperamide prn with good effect. Has noticed certain foods which make the diarrhea worse. Reports 4 episodes of loose stools yesterday and she has run out of loperamide. Denies fevers, chills, dizziness, muscle cramping. Denies n/v, heart burn. Thinks she has seen a GI doctor several years ago but doesn't remember what they told her. She had a colonoscopy in 2013 which was unremarkable. Vit D def noted but she reports being unable to afford supplementation. Dr. Maudie Mercury has been contacted to look into other resources for financial assistance.   Plan: - refill loperamide

## 2018-11-03 NOTE — Progress Notes (Signed)
Case discussed with Dr. Donne Hazel at the time of the visit. We reviewed the resident's history and exam and pertinent patient test results. I agree with the assessment, diagnosis, and plan of care documented in the resident's note.

## 2018-11-03 NOTE — Assessment & Plan Note (Signed)
Had surgery in July 2019 and has had ongoing pain since that time. She continues to work with PT at home, was initially taking oxycodone but ran out of them and still has a few muscle relaxers left that she is using sparingly. Voltaren gel does not help. The pain is worse with movement. No swelling, redness, or warmth on exam. Surgical site is well healed. Limited ROM due to pain.   Plan: - extra strength tylenol - continue PT - if pain relief is not adequate, can try renally dosed tramadol

## 2018-11-04 DIAGNOSIS — I10 Essential (primary) hypertension: Secondary | ICD-10-CM | POA: Diagnosis not present

## 2018-11-05 DIAGNOSIS — I10 Essential (primary) hypertension: Secondary | ICD-10-CM | POA: Diagnosis not present

## 2018-11-06 DIAGNOSIS — I10 Essential (primary) hypertension: Secondary | ICD-10-CM | POA: Diagnosis not present

## 2018-11-07 DIAGNOSIS — I10 Essential (primary) hypertension: Secondary | ICD-10-CM | POA: Diagnosis not present

## 2018-11-08 ENCOUNTER — Telehealth: Payer: Self-pay | Admitting: *Deleted

## 2018-11-08 DIAGNOSIS — I10 Essential (primary) hypertension: Secondary | ICD-10-CM | POA: Diagnosis not present

## 2018-11-08 NOTE — Telephone Encounter (Signed)
Spoke with patient regarding PPM at [facility] as of 11/03/18. Patient verbalizes understanding and is agreeable to moving up f/u appointment with Dr. Lovena Le to 11/25/18 at 8:45am (needs AM appointments) to discuss gen change. Patient denies additional questions or concerns at this time and thanked me for my call.

## 2018-11-09 DIAGNOSIS — I10 Essential (primary) hypertension: Secondary | ICD-10-CM | POA: Diagnosis not present

## 2018-11-10 ENCOUNTER — Encounter: Payer: Self-pay | Admitting: Internal Medicine

## 2018-11-10 DIAGNOSIS — I10 Essential (primary) hypertension: Secondary | ICD-10-CM | POA: Diagnosis not present

## 2018-11-11 ENCOUNTER — Ambulatory Visit (INDEPENDENT_AMBULATORY_CARE_PROVIDER_SITE_OTHER): Payer: Medicaid Other | Admitting: Physician Assistant

## 2018-11-11 ENCOUNTER — Encounter (INDEPENDENT_AMBULATORY_CARE_PROVIDER_SITE_OTHER): Payer: Self-pay | Admitting: Orthopedic Surgery

## 2018-11-11 VITALS — Ht 62.0 in | Wt 125.7 lb

## 2018-11-11 DIAGNOSIS — S88112A Complete traumatic amputation at level between knee and ankle, left lower leg, initial encounter: Secondary | ICD-10-CM

## 2018-11-11 DIAGNOSIS — Z7689 Persons encountering health services in other specified circumstances: Secondary | ICD-10-CM | POA: Diagnosis not present

## 2018-11-11 DIAGNOSIS — I10 Essential (primary) hypertension: Secondary | ICD-10-CM | POA: Diagnosis not present
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
## 2018-11-12 DIAGNOSIS — I10 Essential (primary) hypertension: Secondary | ICD-10-CM | POA: Diagnosis not present

## 2018-11-13 DIAGNOSIS — I10 Essential (primary) hypertension: Secondary | ICD-10-CM | POA: Diagnosis not present

## 2018-11-14 DIAGNOSIS — I10 Essential (primary) hypertension: Secondary | ICD-10-CM | POA: Diagnosis not present

## 2018-11-15 DIAGNOSIS — I10 Essential (primary) hypertension: Secondary | ICD-10-CM | POA: Diagnosis not present

## 2018-11-16 DIAGNOSIS — I10 Essential (primary) hypertension: Secondary | ICD-10-CM | POA: Diagnosis not present

## 2018-11-17 DIAGNOSIS — I10 Essential (primary) hypertension: Secondary | ICD-10-CM | POA: Diagnosis not present

## 2018-11-18 DIAGNOSIS — I10 Essential (primary) hypertension: Secondary | ICD-10-CM | POA: Diagnosis not present

## 2018-11-19 DIAGNOSIS — I10 Essential (primary) hypertension: Secondary | ICD-10-CM | POA: Diagnosis not present

## 2018-11-20 DIAGNOSIS — I10 Essential (primary) hypertension: Secondary | ICD-10-CM | POA: Diagnosis not present

## 2018-11-21 DIAGNOSIS — I10 Essential (primary) hypertension: Secondary | ICD-10-CM | POA: Diagnosis not present

## 2018-11-22 ENCOUNTER — Other Ambulatory Visit: Payer: Self-pay | Admitting: *Deleted

## 2018-11-22 DIAGNOSIS — I10 Essential (primary) hypertension: Secondary | ICD-10-CM | POA: Diagnosis not present

## 2018-11-23 DIAGNOSIS — I10 Essential (primary) hypertension: Secondary | ICD-10-CM | POA: Diagnosis not present

## 2018-11-24 DIAGNOSIS — I10 Essential (primary) hypertension: Secondary | ICD-10-CM | POA: Diagnosis not present

## 2018-11-25 ENCOUNTER — Encounter: Payer: Self-pay | Admitting: Internal Medicine

## 2018-11-25 ENCOUNTER — Encounter: Payer: Medicaid Other | Admitting: Internal Medicine

## 2018-11-25 ENCOUNTER — Ambulatory Visit: Payer: Medicaid Other | Admitting: Internal Medicine

## 2018-11-25 VITALS — BP 134/70 | HR 55 | Ht 62.0 in | Wt 122.0 lb

## 2018-11-25 DIAGNOSIS — Z950 Presence of cardiac pacemaker: Secondary | ICD-10-CM

## 2018-11-25 DIAGNOSIS — I739 Peripheral vascular disease, unspecified: Secondary | ICD-10-CM | POA: Diagnosis not present

## 2018-11-25 DIAGNOSIS — I10 Essential (primary) hypertension: Secondary | ICD-10-CM | POA: Diagnosis not present

## 2018-11-25 DIAGNOSIS — I442 Atrioventricular block, complete: Secondary | ICD-10-CM

## 2018-11-25 DIAGNOSIS — Z7689 Persons encountering health services in other specified circumstances: Secondary | ICD-10-CM | POA: Diagnosis not present

## 2018-11-25 LAB — CBC WITH DIFFERENTIAL/PLATELET
Basophils Absolute: 0.1 10*3/uL (ref 0.0–0.2)
Basos: 1 %
EOS (ABSOLUTE): 0.7 10*3/uL — ABNORMAL HIGH (ref 0.0–0.4)
Eos: 12 %
HEMATOCRIT: 35.8 % (ref 34.0–46.6)
Hemoglobin: 11.7 g/dL (ref 11.1–15.9)
Immature Grans (Abs): 0 10*3/uL (ref 0.0–0.1)
Immature Granulocytes: 0 %
Lymphocytes Absolute: 2 10*3/uL (ref 0.7–3.1)
Lymphs: 36 %
MCH: 30.5 pg (ref 26.6–33.0)
MCHC: 32.7 g/dL (ref 31.5–35.7)
MCV: 93 fL (ref 79–97)
Monocytes Absolute: 0.5 10*3/uL (ref 0.1–0.9)
Monocytes: 9 %
Neutrophils Absolute: 2.3 10*3/uL (ref 1.4–7.0)
Neutrophils: 42 %
Platelets: 146 10*3/uL — ABNORMAL LOW (ref 150–450)
RBC: 3.84 x10E6/uL (ref 3.77–5.28)
RDW: 13.1 % (ref 11.7–15.4)
WBC: 5.5 10*3/uL (ref 3.4–10.8)

## 2018-11-25 LAB — BASIC METABOLIC PANEL
BUN/Creatinine Ratio: 16 (ref 12–28)
BUN: 42 mg/dL — ABNORMAL HIGH (ref 8–27)
CO2: 18 mmol/L — ABNORMAL LOW (ref 20–29)
Calcium: 9.3 mg/dL (ref 8.7–10.3)
Chloride: 107 mmol/L — ABNORMAL HIGH (ref 96–106)
Creatinine, Ser: 2.58 mg/dL — ABNORMAL HIGH (ref 0.57–1.00)
GFR calc Af Amer: 22 mL/min/{1.73_m2} — ABNORMAL LOW (ref >59)
GFR calc non Af Amer: 19 mL/min/{1.73_m2} — ABNORMAL LOW (ref >59)
GLUCOSE: 79 mg/dL (ref 65–99)
Potassium: 4.5 mmol/L (ref 3.5–5.2)
Sodium: 143 mmol/L (ref 134–144)
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
## 2018-11-25 NOTE — Patient Instructions (Addendum)
Medication Instructions:  Your physician recommends that you continue on your current medications as directed. Please refer to the Current Medication list given to you today.  Labwork: You will get lab work today:  BMP and CBC.  Testing/Procedures:  Your physician has recommended that you have your pacemaker battery changed.  A pacemaker battery usually lasts 5-15 years (6-7 years on average). A few times a year, you will be asked to visit your health care provider to have a full evaluation of your pacemaker. When the battery is low, your pacemaker battery and generator will be completely replaced. Most often, this procedure is simpler than the first surgery because the wires (leads) that connect the generator to the heart are already in place.   Follow-Up: You will follow up with device clinic 10-14 days after your procedure for a wound check.  You will follow up with Dr. Lovena Le 91 days after your procedure.   PACEMAKER GENERATOR CHANGE INSTRUCTIONS:  Please arrive to ADMITTING down the hall from the [place] main entrance A of [facility] hospital at:  7:30 am on December 03, 2018  Use the CHG surgical scrub as directed  Do not eat or drink after midnight prior to procedure  On the morning of your procedure you may take your normal morning medications with a sip of water EXCEPT for:  LASIX  You will be discharged after your procedure.  You will need someone to drive you home at discharge  If you need a refill on your cardiac medications before your next appointment, please call your pharmacy.    Pacemaker Battery Change  A pacemaker battery usually lasts 5-15 years (6-7 years on average). A few times a year, you will be asked to visit your health care provider to have a full evaluation of your pacemaker. When the battery is low, your pacemaker battery and generator will be completely replaced. Most often, this procedure is simpler than the first surgery because the wires (leads)  that connect the generator to the heart are already in place. There are many things that affect how long a pacemaker battery will last, including:  The age of the pacemaker.  The number of leads you have(1, 2, or 3).  The pacemaker workload. If the pacemaker is helping the heart more often, the battery will not last as long.  Power (voltage) settings. Tell a health care provider about:  Any allergies you have.  All medicines you are taking, including vitamins, herbs, eye drops, creams, and over-the-counter medicines.  Any problems you or family members have had with anesthetic medicines.  Any blood disorders you have.  Any surgeries you have had, especially the surgeries you have had since your last pacemaker was placed.  Any medical conditions you have.  Whether you are pregnant or may be pregnant.  Any symptoms of heart problems, such as chest pain, trouble breathing, palpitations, light-headedness, or feelings of an abnormal or irregular heartbeat.  Smoking habits. This can affect your reaction to anesthesia. What are the risks? Generally, this is a safe procedure. However, problems may occur, including:  Bleeding.  Bruising of the skin around where the surgical cut (incision) was made.  Pulling apart of the skin at the incision site.  Infection.  Nerve damage.  Injury to other organs, such as the lungs.  Allergic reaction to anesthetics or other medicines used during the procedure.  People with diabetes may have a temporary increase in blood sugar (glucose) after any surgical procedure. What happens before  the procedure? Staying hydrated Follow instructions from your health care provider about hydration, which may include:  Up to 2 hours before the procedure - you may continue to drink clear liquids, such as water, clear fruit juice, black coffee, and plain tea. Eating and drinking restrictions Follow instructions from your health care provider about eating  and drinking restrictions, which may include:  8 hours before the procedure - stop eating heavy meals or foods such as meat, fried foods, or fatty foods.  6 hours before the procedure - stop eating light meals or foods, such as toast or cereal.  6 hours before the procedure - stop drinking milk or drinks that contain milk.  2 hours before the procedure - stop drinking clear liquids. General instructions  Ask your health care provider about: ? Changing or stopping your regular medicines. This is especially important if you are taking diabetes medicines or blood thinners. ? Taking medicines such as aspirin and ibuprofen. These medicines can thin your blood. Do not take these medicines before your procedure if your health care provider instructs you not to. ? Taking a sip of water with any approved medicines on the morning of the procedure.  Plan to have someone take you home after the procedure. What happens during the procedure?  To reduce your risk of infection: ? Your health care team will wash or sanitize their hands. ? The skin around the area of the chest will be washed with soap. ? Hair may be removed from the surgical area.  An IV tube will be inserted into one your veins to give you medicine and fluids.  You will be given one or more of the following: ? A medicine to help you relax (sedative). ? A medicine to numb the area where the pacemaker is located (local anesthetic).  You may be given antibiotic medicine to prevent infection.  Your health care provider will make an incision to reopen the pocket holding the pacemaker.  The old pacemaker will be disconnected from the leads.  The leads will be tested.  If needed, the leads will be replaced. If the leads are functioning properly, the new pacemaker will be connected to the existing leads.  A heart monitor and the pacemaker programmer will be used to make sure that the newly implanted pacemaker is working  properly.  The incision site will be closed. A bandage (dressing) will be placed over the pacemaker site. The procedure may vary among health care providers and hospitals. What happens after the procedure?  Your blood pressure, heart rate, breathing rate, and blood oxygen level will be monitored until your health care team is satisfied that your pacemaker is working properly.  Your health care provider will tell you when your pacemaker will need to be tested again, or when to return to the office for removal of dressing and stitches.  Do not drive for 24 hours if you were given a sedative.  The dressing will be removed 24-48 hours after the procedure, or as told by your health care provider. Summary  A pacemaker battery usually lasts 5-15 years (6-7 years on average).  When the battery is low, your pacemaker battery and generator will be completely replaced.  Risks of this procedure include bleeding, bruising, infection, damage to other structures, pulling apart of the skin at the incision site, and allergic reactions to medicines or anesthetics.  Most often, this procedure is simpler than the first surgery because the wires (leads) that connect the generator to the  heart are already in place. This information is not intended to replace advice given to you by your health care provider. Make sure you discuss any questions you have with your health care provider. Document Released: 12/24/2006 Document Revised: 08/13/2017 Document Reviewed: 08/19/2016 Elsevier Interactive Patient Education  2019 [employer].

## 2018-11-26 DIAGNOSIS — I10 Essential (primary) hypertension: Secondary | ICD-10-CM | POA: Diagnosis not present

## 2018-11-26 LAB — CUP PACEART INCLINIC DEVICE CHECK
Date Time Interrogation Session: 20200228092335
Implantable Lead Implant Date: 20110225
Implantable Lead Implant Date: 20110225
Implantable Lead Location: 753859
Implantable Pulse Generator Implant Date: 20110225
MDC IDC LEAD LOCATION: 753860
Pulse Gen Model: 2210
Pulse Gen Serial Number: 7114075

## 2018-11-27 DIAGNOSIS — I10 Essential (primary) hypertension: Secondary | ICD-10-CM | POA: Diagnosis not present

## 2018-11-28 DIAGNOSIS — I10 Essential (primary) hypertension: Secondary | ICD-10-CM | POA: Diagnosis not present

## 2018-11-29 DIAGNOSIS — I10 Essential (primary) hypertension: Secondary | ICD-10-CM | POA: Diagnosis not present

## 2018-11-30 DIAGNOSIS — I10 Essential (primary) hypertension: Secondary | ICD-10-CM | POA: Diagnosis not present

## 2018-12-01 DIAGNOSIS — D649 Anemia, unspecified: Secondary | ICD-10-CM | POA: Diagnosis not present

## 2018-12-01 DIAGNOSIS — N184 Chronic kidney disease, stage 4 (severe): Secondary | ICD-10-CM | POA: Diagnosis not present

## 2018-12-01 DIAGNOSIS — Z7689 Persons encountering health services in other specified circumstances: Secondary | ICD-10-CM | POA: Diagnosis not present

## 2018-12-01 DIAGNOSIS — N2581 Secondary hyperparathyroidism of renal origin: Secondary | ICD-10-CM | POA: Diagnosis not present

## 2018-12-01 DIAGNOSIS — I10 Essential (primary) hypertension: Secondary | ICD-10-CM | POA: Diagnosis not present

## 2018-12-02 ENCOUNTER — Other Ambulatory Visit: Payer: Self-pay | Admitting: *Deleted

## 2018-12-02 DIAGNOSIS — I10 Essential (primary) hypertension: Secondary | ICD-10-CM | POA: Diagnosis not present

## 2018-12-03 ENCOUNTER — Ambulatory Visit (HOSPITAL_COMMUNITY)
Admission: RE | Admit: 2018-12-03 | Discharge: 2018-12-03 | Disposition: A | Discharge status: Home / Self Care | Payer: Medicaid Other | Attending: Internal Medicine | Admitting: Internal Medicine

## 2018-12-03 ENCOUNTER — Other Ambulatory Visit: Payer: Self-pay

## 2018-12-03 ENCOUNTER — Encounter (HOSPITAL_COMMUNITY): Payer: Self-pay | Admitting: Internal Medicine

## 2018-12-03 ENCOUNTER — Encounter (HOSPITAL_COMMUNITY)
Admission: RE | Disposition: A | Discharge status: Home / Self Care | Payer: Self-pay | Source: Home / Self Care | Attending: Internal Medicine

## 2018-12-03 DIAGNOSIS — M1990 Unspecified osteoarthritis, unspecified site: Secondary | ICD-10-CM | POA: Insufficient documentation

## 2018-12-03 DIAGNOSIS — E1151 Type 2 diabetes mellitus with diabetic peripheral angiopathy without gangrene: Secondary | ICD-10-CM | POA: Diagnosis not present

## 2018-12-03 DIAGNOSIS — I129 Hypertensive chronic kidney disease with stage 1 through stage 4 chronic kidney disease, or unspecified chronic kidney disease: Secondary | ICD-10-CM | POA: Insufficient documentation

## 2018-12-03 DIAGNOSIS — Z7689 Persons encountering health services in other specified circumstances: Secondary | ICD-10-CM | POA: Diagnosis not present

## 2018-12-03 DIAGNOSIS — I2510 Atherosclerotic heart disease of native coronary artery without angina pectoris: Secondary | ICD-10-CM | POA: Insufficient documentation

## 2018-12-03 DIAGNOSIS — F17210 Nicotine dependence, cigarettes, uncomplicated: Secondary | ICD-10-CM | POA: Diagnosis not present

## 2018-12-03 DIAGNOSIS — Z823 Family history of stroke: Secondary | ICD-10-CM | POA: Diagnosis not present

## 2018-12-03 DIAGNOSIS — I442 Atrioventricular block, complete: Secondary | ICD-10-CM | POA: Insufficient documentation

## 2018-12-03 DIAGNOSIS — Z8249 Family history of ischemic heart disease and other diseases of the circulatory system: Secondary | ICD-10-CM | POA: Insufficient documentation

## 2018-12-03 DIAGNOSIS — Z79899 Other long term (current) drug therapy: Secondary | ICD-10-CM | POA: Diagnosis not present

## 2018-12-03 DIAGNOSIS — E785 Hyperlipidemia, unspecified: Secondary | ICD-10-CM | POA: Insufficient documentation

## 2018-12-03 DIAGNOSIS — I429 Cardiomyopathy, unspecified: Secondary | ICD-10-CM | POA: Diagnosis not present

## 2018-12-03 DIAGNOSIS — Z45010 Encounter for checking and testing of cardiac pacemaker pulse generator [battery]: Secondary | ICD-10-CM | POA: Diagnosis not present

## 2018-12-03 DIAGNOSIS — Z7982 Long term (current) use of aspirin: Secondary | ICD-10-CM | POA: Diagnosis not present

## 2018-12-03 DIAGNOSIS — Z880 Allergy status to penicillin: Secondary | ICD-10-CM | POA: Diagnosis not present

## 2018-12-03 DIAGNOSIS — G5603 Carpal tunnel syndrome, bilateral upper limbs: Secondary | ICD-10-CM | POA: Insufficient documentation

## 2018-12-03 DIAGNOSIS — Z4502 Encounter for adjustment and management of automatic implantable cardiac defibrillator: Secondary | ICD-10-CM | POA: Diagnosis present

## 2018-12-03 DIAGNOSIS — N184 Chronic kidney disease, stage 4 (severe): Secondary | ICD-10-CM | POA: Insufficient documentation

## 2018-12-03 DIAGNOSIS — Z90710 Acquired absence of both cervix and uterus: Secondary | ICD-10-CM | POA: Diagnosis not present

## 2018-12-03 DIAGNOSIS — E1140 Type 2 diabetes mellitus with diabetic neuropathy, unspecified: Secondary | ICD-10-CM | POA: Insufficient documentation

## 2018-12-03 DIAGNOSIS — H409 Unspecified glaucoma: Secondary | ICD-10-CM | POA: Insufficient documentation

## 2018-12-03 DIAGNOSIS — I495 Sick sinus syndrome: Secondary | ICD-10-CM | POA: Diagnosis not present

## 2018-12-03 DIAGNOSIS — Z89512 Acquired absence of left leg below knee: Secondary | ICD-10-CM | POA: Diagnosis not present

## 2018-12-03 DIAGNOSIS — Z90722 Acquired absence of ovaries, bilateral: Secondary | ICD-10-CM | POA: Diagnosis not present

## 2018-12-03 DIAGNOSIS — Z8674 Personal history of sudden cardiac arrest: Secondary | ICD-10-CM | POA: Insufficient documentation

## 2018-12-03 DIAGNOSIS — E1122 Type 2 diabetes mellitus with diabetic chronic kidney disease: Secondary | ICD-10-CM | POA: Insufficient documentation

## 2018-12-03 DIAGNOSIS — I10 Essential (primary) hypertension: Secondary | ICD-10-CM | POA: Diagnosis not present

## 2018-12-03 DIAGNOSIS — Z833 Family history of diabetes mellitus: Secondary | ICD-10-CM | POA: Insufficient documentation

## 2018-12-03 HISTORY — PX: PPM GENERATOR CHANGEOUT: EP1233

## 2018-12-03 LAB — SURGICAL PCR SCREEN
MRSA, PCR: NEGATIVE
Staphylococcus aureus: NEGATIVE

## 2018-12-03 SURGERY — PPM GENERATOR CHANGEOUT

## 2018-12-03 MED ORDER — CHLORHEXIDINE GLUCONATE 4 % EX LIQD
60.0000 mL | Freq: Once | CUTANEOUS | Status: DC
  Filled 2018-12-03: qty 60

## 2018-12-03 MED ORDER — FENTANYL CITRATE (PF) 100 MCG/2ML IJ SOLN
INTRAMUSCULAR | Status: DC | PRN
  Administered 2018-12-03: 25 ug via INTRAVENOUS

## 2018-12-03 MED ORDER — MIDAZOLAM HCL 5 MG/5ML IJ SOLN
INTRAMUSCULAR | Status: AC
  Filled 2018-12-03: qty 5

## 2018-12-03 MED ORDER — ACETAMINOPHEN 325 MG PO TABS: 325.0000 mg | ORAL_TABLET | ORAL | Status: DC | PRN

## 2018-12-03 MED ORDER — VANCOMYCIN HCL IN DEXTROSE 1-5 GM/200ML-% IV SOLN
INTRAVENOUS | Status: AC
  Filled 2018-12-03: qty 200

## 2018-12-03 MED ORDER — MIDAZOLAM HCL 5 MG/5ML IJ SOLN
INTRAMUSCULAR | Status: DC | PRN
  Administered 2018-12-03: 2 mg via INTRAVENOUS

## 2018-12-03 MED ORDER — MUPIROCIN 2 % EX OINT
1.0000 "application " | TOPICAL_OINTMENT | Freq: Once | CUTANEOUS | Status: AC
  Administered 2018-12-03: 1 via TOPICAL
  Filled 2018-12-03: qty 22

## 2018-12-03 MED ORDER — FENTANYL CITRATE (PF) 100 MCG/2ML IJ SOLN
INTRAMUSCULAR | Status: AC
  Filled 2018-12-03: qty 2

## 2018-12-03 MED ORDER — ONDANSETRON HCL 4 MG/2ML IJ SOLN: 4.0000 mg | Freq: Four times a day (QID) | INTRAMUSCULAR | Status: DC | PRN

## 2018-12-03 MED ORDER — MUPIROCIN 2 % EX OINT
TOPICAL_OINTMENT | CUTANEOUS | Status: AC
  Filled 2018-12-03: qty 22

## 2018-12-03 MED ORDER — LIDOCAINE HCL 1 % IJ SOLN
INTRAMUSCULAR | Status: AC
  Filled 2018-12-03: qty 60

## 2018-12-03 MED ORDER — SODIUM CHLORIDE 0.9 % IV SOLN
80.0000 mg | INTRAVENOUS | Status: AC
  Administered 2018-12-03: 80 mg
  Filled 2018-12-03: qty 2

## 2018-12-03 MED ORDER — SODIUM CHLORIDE 0.9 % IV SOLN
INTRAVENOUS | Status: DC
  Administered 2018-12-03: 08:00:00 via INTRAVENOUS

## 2018-12-03 MED ORDER — LIDOCAINE HCL (PF) 1 % IJ SOLN
INTRAMUSCULAR | Status: DC | PRN
  Administered 2018-12-03: 60 mL

## 2018-12-03 MED ORDER — SODIUM CHLORIDE 0.9 % IV SOLN
INTRAVENOUS | Status: AC
  Filled 2018-12-03: qty 2

## 2018-12-03 MED ORDER — VANCOMYCIN HCL IN DEXTROSE 1-5 GM/200ML-% IV SOLN
1000.0000 mg | INTRAVENOUS | Status: AC
  Administered 2018-12-03: 1000 mg via INTRAVENOUS
  Filled 2018-12-03: qty 200

## 2018-12-03 SURGICAL SUPPLY — 4 items
CABLE SURGICAL S-101-97-12 (CABLE) ×2 IMPLANT
PACEMAKER ASSURITY DR-RF (Pacemaker) ×2 IMPLANT
PAD PRO RADIOLUCENT 2001M-C (PAD) ×2 IMPLANT
TRAY PACEMAKER INSERTION (PACKS) ×2 IMPLANT

## 2018-12-03 NOTE — Interval H&P Note (Signed)
History and Physical Interval Note:  12/03/2018 10:58 AM  [name] [name]  has presented today for surgery, with the diagnosis of ERI  The various methods of treatment have been discussed with the patient and family. After consideration of risks, benefits and other options for treatment, the patient has consented to  Procedure(s): PPM GENERATOR CHANGEOUT (N/A) as a surgical intervention .  The patient's history has been reviewed, patient examined, no change in status, stable for surgery.  I have reviewed the patient's chart and labs.  Questions were answered to the patient's satisfaction.     [name]

## 2018-12-03 NOTE — Progress Notes (Signed)
Patient sister at bedside.  Patient and sister voice that sister will ride back on [place] with patient after procedure

## 2018-12-03 NOTE — Discharge Instructions (Signed)
Pacemaker Battery Change, Care After  This sheet gives you information about how to care for yourself after your procedure. Your health care provider may also give you more specific instructions. If you have problems or questions, contact your health care provider.  What can I expect after the procedure?  After your procedure, it is common to have:   Pain or soreness at the site where the pacemaker was inserted.   Swelling at the site where the pacemaker was inserted.  Follow these instructions at home:  Incision care     Keep the incision clean and dry.  ? Do not take baths, swim, or use a hot tub until your health care provider approves.  ? You may shower the day after your procedure, or as directed by your health care provider.  ? Pat the area dry with a clean towel. Do not rub the area. This may cause bleeding.   Follow instructions from your health care provider about how to take care of your incision. Make sure you:  ? Wash your hands with soap and water before you change your bandage (dressing). If soap and water are not available, use hand sanitizer.  ? Change your dressing as told by your health care provider.  ? Leave stitches (sutures), skin glue, or adhesive strips in place. These skin closures may need to stay in place for 2 weeks or longer. If adhesive strip edges start to loosen and curl up, you may trim the loose edges. Do not remove adhesive strips completely unless your health care provider tells you to do that.   Check your incision area every day for signs of infection. Check for:  ? More redness, swelling, or pain.  ? More fluid or blood.  ? Warmth.  ? Pus or a bad smell.  Activity   Do not lift anything that is heavier than 10 lb (4.5 kg) until your health care provider says it is okay to do so.   For the first 2 weeks, or as long as told by your health care provider:  ? Avoid lifting your left arm higher than your shoulder.  ? Be gentle when you move your arms over your head. It is okay  to raise your arm to comb your hair.  ? Avoid strenuous exercise.   Ask your health care provider when it is okay to:  ? Resume your normal activities.  ? Return to work or school.  ? Resume sexual activity.  Eating and drinking   Eat a heart-healthy diet. This should include plenty of fresh fruits and vegetables, whole grains, low-fat dairy products, and lean protein like chicken and fish.   Limit alcohol intake to no more than 1 drink a day for non-pregnant women and 2 drinks a day for men. One drink equals 12 oz of beer, 5 oz of wine, or 1 oz of hard liquor.   Check ingredients and nutrition facts on packaged foods and beverages. Avoid the following types of food:  ? Food that is high in salt (sodium).  ? Food that is high in saturated fat, like full-fat dairy or red meat.  ? Food that is high in trans fat, like fried food.  ? Food and drinks that are high in sugar.  Lifestyle   Do not use any products that contain nicotine or tobacco, such as cigarettes and e-cigarettes. If you need help quitting, ask your health care provider.   Take steps to manage and control your weight.     Get regular exercise. Aim for 150 minutes of moderate-intensity exercise (such as walking or yoga) or 75 minutes of vigorous exercise (such as running or swimming) each week.   Manage other health problems, such as diabetes or high blood pressure. Ask your health care provider how you can manage these conditions.  General instructions   Do not drive for 24 hours after your procedure if you were given a medicine to help you relax (sedative).   Take over-the-counter and prescription medicines only as told by your health care provider.   Avoid putting pressure on the area where the pacemaker was placed.   If you need an MRI after your pacemaker has been placed, be sure to tell the health care provider who orders the MRI that you have a pacemaker.   Avoid close and prolonged exposure to electrical devices that have strong  magnetic fields. These include:  ? Cell phones. Avoid keeping them in a pocket near the pacemaker, and try using the ear opposite the pacemaker.  ? MP3 players.  ? Household appliances, like microwaves.  ? Metal detectors.  ? Electric generators.  ? High-tension wires.   Keep all follow-up visits as directed by your health care provider. This is important.  Contact a health care provider if:   You have pain at the incision site that is not relieved by over-the-counter or prescription medicines.   You have any of these around your incision site or coming from it:  ? More redness, swelling, or pain.  ? Fluid or blood.  ? Warmth to the touch.  ? Pus or a bad smell.   You have a fever.   You feel brief, occasional palpitations, light-headedness, or any symptoms that you think might be related to your heart.  Get help right away if:   You experience chest pain that is different from the pain at the pacemaker site.   You develop a red streak that extends above or below the incision site.   You experience shortness of breath.   You have palpitations or an irregular heartbeat.   You have light-headedness that does not go away quickly.   You faint or have dizzy spells.   Your pulse suddenly drops or increases rapidly and does not return to normal.   You begin to gain weight and your legs and ankles swell.  Summary   After your procedure, it is common to have pain, soreness, and some swelling where the pacemaker was inserted.   Make sure to keep your incision clean and dry. Follow instructions from your health care provider about how to take care of your incision.   Check your incision every day for signs of infection, such as more pain or swelling, pus or a bad smell, warmth, or leaking fluid and blood.   Avoid strenuous exercise and lifting your left arm higher than your shoulder for 2 weeks, or as long as told by your health care provider.  This information is not intended to replace advice given to you by  your health care provider. Make sure you discuss any questions you have with your health care provider.  Document Released: 07/06/2013 Document Revised: 08/07/2016 Document Reviewed: 08/07/2016  Elsevier Interactive Patient Education  2019 [employer]

## 2018-12-04 DIAGNOSIS — I10 Essential (primary) hypertension: Secondary | ICD-10-CM | POA: Diagnosis not present

## 2018-12-05 DIAGNOSIS — I10 Essential (primary) hypertension: Secondary | ICD-10-CM | POA: Diagnosis not present

## 2018-12-06 DIAGNOSIS — I10 Essential (primary) hypertension: Secondary | ICD-10-CM | POA: Diagnosis not present

## 2018-12-07 DIAGNOSIS — I10 Essential (primary) hypertension: Secondary | ICD-10-CM | POA: Diagnosis not present

## 2018-12-08 DIAGNOSIS — I10 Essential (primary) hypertension: Secondary | ICD-10-CM | POA: Diagnosis not present

## 2018-12-09 DIAGNOSIS — I10 Essential (primary) hypertension: Secondary | ICD-10-CM | POA: Diagnosis not present

## 2018-12-10 ENCOUNTER — Other Ambulatory Visit: Payer: Self-pay | Admitting: Internal Medicine

## 2018-12-10 DIAGNOSIS — I10 Essential (primary) hypertension: Secondary | ICD-10-CM | POA: Diagnosis not present

## 2018-12-11 DIAGNOSIS — I10 Essential (primary) hypertension: Secondary | ICD-10-CM | POA: Diagnosis not present

## 2018-12-12 DIAGNOSIS — I10 Essential (primary) hypertension: Secondary | ICD-10-CM | POA: Diagnosis not present

## 2018-12-13 DIAGNOSIS — I10 Essential (primary) hypertension: Secondary | ICD-10-CM | POA: Diagnosis not present

## 2018-12-14 ENCOUNTER — Ambulatory Visit (INDEPENDENT_AMBULATORY_CARE_PROVIDER_SITE_OTHER): Payer: Medicaid Other | Admitting: Nurse Practitioner

## 2018-12-14 ENCOUNTER — Other Ambulatory Visit: Payer: Self-pay

## 2018-12-14 ENCOUNTER — Encounter (INDEPENDENT_AMBULATORY_CARE_PROVIDER_SITE_OTHER): Payer: Self-pay

## 2018-12-14 DIAGNOSIS — I10 Essential (primary) hypertension: Secondary | ICD-10-CM | POA: Diagnosis not present

## 2018-12-14 DIAGNOSIS — I442 Atrioventricular block, complete: Secondary | ICD-10-CM | POA: Diagnosis not present

## 2018-12-14 DIAGNOSIS — Z7689 Persons encountering health services in other specified circumstances: Secondary | ICD-10-CM | POA: Diagnosis not present

## 2018-12-14 LAB — CUP PACEART INCLINIC DEVICE CHECK
Battery Remaining Longevity: 122 mo
Battery Voltage: 3.11 V
Brady Statistic RA Percent Paced: 96 %
Brady Statistic RV Percent Paced: 99.99 %
Date Time Interrogation Session: 20200317102138
Implantable Lead Implant Date: 20110225
Implantable Lead Implant Date: 20110225
Implantable Lead Location: 753860
Implantable Pulse Generator Implant Date: 20200306
Lead Channel Impedance Value: 450 Ohm
Lead Channel Impedance Value: 450 Ohm
Lead Channel Pacing Threshold Amplitude: 0.5 V
Lead Channel Pacing Threshold Amplitude: 0.75 V
Lead Channel Pacing Threshold Pulse Width: 0.4 ms
Lead Channel Pacing Threshold Pulse Width: 0.4 ms
Lead Channel Sensing Intrinsic Amplitude: 1 mV
Lead Channel Setting Pacing Amplitude: 1.5 V
Lead Channel Setting Pacing Pulse Width: 0.4 ms
MDC IDC LEAD LOCATION: 753859
MDC IDC SET LEADCHNL RV PACING AMPLITUDE: 1 V
MDC IDC SET LEADCHNL RV SENSING SENSITIVITY: 4 mV
Pulse Gen Model: 2272
Pulse Gen Serial Number: 9114794

## 2018-12-14 NOTE — Progress Notes (Signed)
Wound check appointment. Steri-strips removed. Wound without redness or edema. Incision edges approximated, wound well healed. Normal device function. Thresholds, sensing, and impedances consistent with implant measurements.  Histogram distribution appropriate for patient and level of activity. No mode switches or high ventricular rates noted. Patient educated about wound care, arm mobility, lifting restrictions. ROV in 3 months with implanting physician.

## 2018-12-15 ENCOUNTER — Encounter: Payer: Self-pay | Admitting: Internal Medicine

## 2018-12-15 DIAGNOSIS — I10 Essential (primary) hypertension: Secondary | ICD-10-CM | POA: Diagnosis not present

## 2018-12-16 DIAGNOSIS — I10 Essential (primary) hypertension: Secondary | ICD-10-CM | POA: Diagnosis not present

## 2018-12-17 DIAGNOSIS — I10 Essential (primary) hypertension: Secondary | ICD-10-CM | POA: Diagnosis not present

## 2018-12-18 DIAGNOSIS — I10 Essential (primary) hypertension: Secondary | ICD-10-CM | POA: Diagnosis not present

## 2018-12-19 DIAGNOSIS — I10 Essential (primary) hypertension: Secondary | ICD-10-CM | POA: Diagnosis not present

## 2018-12-20 DIAGNOSIS — I10 Essential (primary) hypertension: Secondary | ICD-10-CM | POA: Diagnosis not present

## 2018-12-21 ENCOUNTER — Other Ambulatory Visit: Payer: Self-pay | Admitting: Internal Medicine

## 2018-12-21 DIAGNOSIS — I10 Essential (primary) hypertension: Secondary | ICD-10-CM | POA: Diagnosis not present

## 2018-12-22 DIAGNOSIS — I10 Essential (primary) hypertension: Secondary | ICD-10-CM | POA: Diagnosis not present

## 2018-12-23 ENCOUNTER — Encounter: Payer: Medicaid Other | Admitting: Internal Medicine

## 2018-12-23 DIAGNOSIS — Z7689 Persons encountering health services in other specified circumstances: Secondary | ICD-10-CM | POA: Diagnosis not present

## 2018-12-23 DIAGNOSIS — I10 Essential (primary) hypertension: Secondary | ICD-10-CM | POA: Diagnosis not present

## 2018-12-24 DIAGNOSIS — I10 Essential (primary) hypertension: Secondary | ICD-10-CM | POA: Diagnosis not present

## 2018-12-25 DIAGNOSIS — I10 Essential (primary) hypertension: Secondary | ICD-10-CM | POA: Diagnosis not present

## 2018-12-26 DIAGNOSIS — I10 Essential (primary) hypertension: Secondary | ICD-10-CM | POA: Diagnosis not present

## 2018-12-27 DIAGNOSIS — I10 Essential (primary) hypertension: Secondary | ICD-10-CM | POA: Diagnosis not present

## 2018-12-28 DIAGNOSIS — I10 Essential (primary) hypertension: Secondary | ICD-10-CM | POA: Diagnosis not present

## 2018-12-29 DIAGNOSIS — I10 Essential (primary) hypertension: Secondary | ICD-10-CM | POA: Diagnosis not present

## 2018-12-30 ENCOUNTER — Other Ambulatory Visit: Payer: Self-pay | Admitting: *Deleted

## 2018-12-30 DIAGNOSIS — I10 Essential (primary) hypertension: Secondary | ICD-10-CM | POA: Diagnosis not present

## 2018-12-31 DIAGNOSIS — I10 Essential (primary) hypertension: Secondary | ICD-10-CM | POA: Diagnosis not present

## 2019-01-01 DIAGNOSIS — I10 Essential (primary) hypertension: Secondary | ICD-10-CM | POA: Diagnosis not present

## 2019-01-02 DIAGNOSIS — I10 Essential (primary) hypertension: Secondary | ICD-10-CM | POA: Diagnosis not present

## 2019-01-03 DIAGNOSIS — I10 Essential (primary) hypertension: Secondary | ICD-10-CM | POA: Diagnosis not present

## 2019-01-04 DIAGNOSIS — I10 Essential (primary) hypertension: Secondary | ICD-10-CM | POA: Diagnosis not present

## 2019-01-05 DIAGNOSIS — I10 Essential (primary) hypertension: Secondary | ICD-10-CM | POA: Diagnosis not present

## 2019-01-06 ENCOUNTER — Telehealth (INDEPENDENT_AMBULATORY_CARE_PROVIDER_SITE_OTHER): Payer: Self-pay

## 2019-01-06 DIAGNOSIS — I10 Essential (primary) hypertension: Secondary | ICD-10-CM | POA: Diagnosis not present

## 2019-01-06 NOTE — Telephone Encounter (Signed)
I called pt and she was not aware that she had an appt on Monday with Dr. Sharol Given. She said that she will call transportation and see if she can arrange this. If not she will need to call back and reschedule. I advised the pt that this is ok but did ask her the prescreen COVID-19 question in the event that she is able to keep the appt. She answered all questions NO.

## 2019-01-07 ENCOUNTER — Encounter: Payer: Medicaid Other | Admitting: Internal Medicine

## 2019-01-07 DIAGNOSIS — I10 Essential (primary) hypertension: Secondary | ICD-10-CM | POA: Diagnosis not present

## 2019-01-08 DIAGNOSIS — I10 Essential (primary) hypertension: Secondary | ICD-10-CM | POA: Diagnosis not present

## 2019-01-09 DIAGNOSIS — I10 Essential (primary) hypertension: Secondary | ICD-10-CM | POA: Diagnosis not present

## 2019-01-10 ENCOUNTER — Other Ambulatory Visit: Payer: Self-pay | Admitting: Internal Medicine

## 2019-01-10 ENCOUNTER — Ambulatory Visit (INDEPENDENT_AMBULATORY_CARE_PROVIDER_SITE_OTHER): Payer: Medicaid Other | Admitting: Orthopedic Surgery

## 2019-01-10 DIAGNOSIS — I10 Essential (primary) hypertension: Secondary | ICD-10-CM | POA: Diagnosis not present

## 2019-01-11 DIAGNOSIS — I10 Essential (primary) hypertension: Secondary | ICD-10-CM | POA: Diagnosis not present

## 2019-01-12 ENCOUNTER — Ambulatory Visit: Payer: Medicaid Other | Admitting: Podiatry

## 2019-01-12 DIAGNOSIS — I10 Essential (primary) hypertension: Secondary | ICD-10-CM | POA: Diagnosis not present

## 2019-01-13 DIAGNOSIS — I10 Essential (primary) hypertension: Secondary | ICD-10-CM | POA: Diagnosis not present

## 2019-01-14 DIAGNOSIS — I10 Essential (primary) hypertension: Secondary | ICD-10-CM | POA: Diagnosis not present

## 2019-01-15 DIAGNOSIS — I10 Essential (primary) hypertension: Secondary | ICD-10-CM | POA: Diagnosis not present

## 2019-01-16 DIAGNOSIS — I10 Essential (primary) hypertension: Secondary | ICD-10-CM | POA: Diagnosis not present

## 2019-01-17 ENCOUNTER — Other Ambulatory Visit: Payer: Self-pay

## 2019-01-17 ENCOUNTER — Ambulatory Visit: Payer: Medicaid Other | Admitting: Podiatry

## 2019-01-17 DIAGNOSIS — E0843 Diabetes mellitus due to underlying condition with diabetic autonomic (poly)neuropathy: Secondary | ICD-10-CM

## 2019-01-17 DIAGNOSIS — B351 Tinea unguium: Secondary | ICD-10-CM | POA: Diagnosis not present

## 2019-01-17 DIAGNOSIS — Z7689 Persons encountering health services in other specified circumstances: Secondary | ICD-10-CM | POA: Diagnosis not present

## 2019-01-17 DIAGNOSIS — L989 Disorder of the skin and subcutaneous tissue, unspecified: Secondary | ICD-10-CM

## 2019-01-17 DIAGNOSIS — I10 Essential (primary) hypertension: Secondary | ICD-10-CM | POA: Diagnosis not present

## 2019-01-17 DIAGNOSIS — E1151 Type 2 diabetes mellitus with diabetic peripheral angiopathy without gangrene: Secondary | ICD-10-CM

## 2019-01-17 DIAGNOSIS — M79676 Pain in unspecified toe(s): Secondary | ICD-10-CM
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
## 2019-01-18 ENCOUNTER — Encounter (INDEPENDENT_AMBULATORY_CARE_PROVIDER_SITE_OTHER): Payer: Self-pay | Admitting: Orthopedic Surgery

## 2019-01-18 ENCOUNTER — Other Ambulatory Visit: Payer: Self-pay

## 2019-01-18 ENCOUNTER — Ambulatory Visit (INDEPENDENT_AMBULATORY_CARE_PROVIDER_SITE_OTHER): Payer: Medicaid Other | Admitting: Orthopedic Surgery

## 2019-01-18 VITALS — Ht 61.0 in | Wt 126.0 lb

## 2019-01-18 DIAGNOSIS — Z89512 Acquired absence of left leg below knee: Secondary | ICD-10-CM

## 2019-01-18 DIAGNOSIS — Z7689 Persons encountering health services in other specified circumstances: Secondary | ICD-10-CM | POA: Diagnosis not present

## 2019-01-18 DIAGNOSIS — I10 Essential (primary) hypertension: Secondary | ICD-10-CM | POA: Diagnosis not present

## 2019-01-18 DIAGNOSIS — S88112A Complete traumatic amputation at level between knee and ankle, left lower leg, initial encounter: Secondary | ICD-10-CM
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
## 2019-01-19 DIAGNOSIS — I10 Essential (primary) hypertension: Secondary | ICD-10-CM | POA: Diagnosis not present

## 2019-01-20 DIAGNOSIS — I10 Essential (primary) hypertension: Secondary | ICD-10-CM | POA: Diagnosis not present

## 2019-01-21 DIAGNOSIS — I10 Essential (primary) hypertension: Secondary | ICD-10-CM | POA: Diagnosis not present

## 2019-01-22 DIAGNOSIS — I10 Essential (primary) hypertension: Secondary | ICD-10-CM | POA: Diagnosis not present

## 2019-01-23 DIAGNOSIS — I10 Essential (primary) hypertension: Secondary | ICD-10-CM | POA: Diagnosis not present

## 2019-01-24 DIAGNOSIS — I10 Essential (primary) hypertension: Secondary | ICD-10-CM | POA: Diagnosis not present

## 2019-01-25 DIAGNOSIS — I10 Essential (primary) hypertension: Secondary | ICD-10-CM | POA: Diagnosis not present

## 2019-01-26 DIAGNOSIS — I10 Essential (primary) hypertension: Secondary | ICD-10-CM | POA: Diagnosis not present

## 2019-01-27 ENCOUNTER — Encounter: Payer: Medicaid Other | Admitting: Internal Medicine

## 2019-01-27 ENCOUNTER — Other Ambulatory Visit: Payer: Self-pay

## 2019-01-27 DIAGNOSIS — I10 Essential (primary) hypertension: Secondary | ICD-10-CM | POA: Diagnosis not present

## 2019-01-28 DIAGNOSIS — I10 Essential (primary) hypertension: Secondary | ICD-10-CM | POA: Diagnosis not present

## 2019-01-29 DIAGNOSIS — I10 Essential (primary) hypertension: Secondary | ICD-10-CM | POA: Diagnosis not present

## 2019-01-30 DIAGNOSIS — I10 Essential (primary) hypertension: Secondary | ICD-10-CM | POA: Diagnosis not present

## 2019-01-31 DIAGNOSIS — I10 Essential (primary) hypertension: Secondary | ICD-10-CM | POA: Diagnosis not present

## 2019-02-01 DIAGNOSIS — I10 Essential (primary) hypertension: Secondary | ICD-10-CM | POA: Diagnosis not present

## 2019-02-02 DIAGNOSIS — I10 Essential (primary) hypertension: Secondary | ICD-10-CM | POA: Diagnosis not present

## 2019-02-03 DIAGNOSIS — I10 Essential (primary) hypertension: Secondary | ICD-10-CM | POA: Diagnosis not present

## 2019-02-04 DIAGNOSIS — I10 Essential (primary) hypertension: Secondary | ICD-10-CM | POA: Diagnosis not present

## 2019-02-05 DIAGNOSIS — I10 Essential (primary) hypertension: Secondary | ICD-10-CM | POA: Diagnosis not present

## 2019-02-06 DIAGNOSIS — I10 Essential (primary) hypertension: Secondary | ICD-10-CM | POA: Diagnosis not present

## 2019-02-07 DIAGNOSIS — I10 Essential (primary) hypertension: Secondary | ICD-10-CM | POA: Diagnosis not present

## 2019-02-08 ENCOUNTER — Other Ambulatory Visit: Payer: Self-pay | Admitting: *Deleted

## 2019-02-08 DIAGNOSIS — I10 Essential (primary) hypertension: Secondary | ICD-10-CM | POA: Diagnosis not present

## 2019-02-08 DIAGNOSIS — I25118 Atherosclerotic heart disease of native coronary artery with other forms of angina pectoris: Secondary | ICD-10-CM

## 2019-02-08 DIAGNOSIS — I739 Peripheral vascular disease, unspecified: Secondary | ICD-10-CM

## 2019-02-09 DIAGNOSIS — I10 Essential (primary) hypertension: Secondary | ICD-10-CM | POA: Diagnosis not present

## 2019-02-09 MED ORDER — ATORVASTATIN CALCIUM 40 MG PO TABS: 40.0000 mg | ORAL_TABLET | Freq: Every day | ORAL | 3 refills | Status: DC

## 2019-02-10 DIAGNOSIS — I10 Essential (primary) hypertension: Secondary | ICD-10-CM | POA: Diagnosis not present

## 2019-02-11 DIAGNOSIS — I10 Essential (primary) hypertension: Secondary | ICD-10-CM | POA: Diagnosis not present

## 2019-02-12 DIAGNOSIS — I10 Essential (primary) hypertension: Secondary | ICD-10-CM | POA: Diagnosis not present

## 2019-02-13 DIAGNOSIS — I10 Essential (primary) hypertension: Secondary | ICD-10-CM | POA: Diagnosis not present

## 2019-02-14 DIAGNOSIS — I10 Essential (primary) hypertension: Secondary | ICD-10-CM | POA: Diagnosis not present

## 2019-02-15 DIAGNOSIS — I10 Essential (primary) hypertension: Secondary | ICD-10-CM | POA: Diagnosis not present

## 2019-02-16 DIAGNOSIS — I10 Essential (primary) hypertension: Secondary | ICD-10-CM | POA: Diagnosis not present

## 2019-02-17 DIAGNOSIS — I10 Essential (primary) hypertension: Secondary | ICD-10-CM | POA: Diagnosis not present

## 2019-02-18 DIAGNOSIS — I10 Essential (primary) hypertension: Secondary | ICD-10-CM | POA: Diagnosis not present

## 2019-02-19 DIAGNOSIS — I10 Essential (primary) hypertension: Secondary | ICD-10-CM | POA: Diagnosis not present

## 2019-02-20 DIAGNOSIS — I10 Essential (primary) hypertension: Secondary | ICD-10-CM | POA: Diagnosis not present

## 2019-02-21 DIAGNOSIS — I10 Essential (primary) hypertension: Secondary | ICD-10-CM | POA: Diagnosis not present

## 2019-02-22 DIAGNOSIS — I10 Essential (primary) hypertension: Secondary | ICD-10-CM | POA: Diagnosis not present

## 2019-02-23 DIAGNOSIS — I10 Essential (primary) hypertension: Secondary | ICD-10-CM | POA: Diagnosis not present

## 2019-02-24 DIAGNOSIS — I10 Essential (primary) hypertension: Secondary | ICD-10-CM | POA: Diagnosis not present

## 2019-02-25 DIAGNOSIS — I10 Essential (primary) hypertension: Secondary | ICD-10-CM | POA: Diagnosis not present

## 2019-02-26 DIAGNOSIS — I10 Essential (primary) hypertension: Secondary | ICD-10-CM | POA: Diagnosis not present

## 2019-02-28 ENCOUNTER — Ambulatory Visit: Payer: Medicaid Other | Admitting: Orthopedic Surgery

## 2019-02-28 DIAGNOSIS — I10 Essential (primary) hypertension: Secondary | ICD-10-CM | POA: Diagnosis not present

## 2019-03-01 DIAGNOSIS — I10 Essential (primary) hypertension: Secondary | ICD-10-CM | POA: Diagnosis not present

## 2019-03-02 DIAGNOSIS — I10 Essential (primary) hypertension: Secondary | ICD-10-CM | POA: Diagnosis not present

## 2019-03-03 ENCOUNTER — Ambulatory Visit (INDEPENDENT_AMBULATORY_CARE_PROVIDER_SITE_OTHER): Payer: Medicaid Other | Admitting: *Deleted

## 2019-03-03 DIAGNOSIS — I495 Sick sinus syndrome: Secondary | ICD-10-CM

## 2019-03-03 DIAGNOSIS — I10 Essential (primary) hypertension: Secondary | ICD-10-CM | POA: Diagnosis not present

## 2019-03-03 DIAGNOSIS — I442 Atrioventricular block, complete: Secondary | ICD-10-CM

## 2019-03-03 LAB — CUP PACEART REMOTE DEVICE CHECK
Battery Remaining Longevity: 124 mo
Battery Remaining Percentage: 95.5 %
Battery Voltage: 3.02 V
Brady Statistic AP VP Percent: 96 %
Brady Statistic AP VS Percent: 1 %
Brady Statistic AS VP Percent: 3.9 %
Brady Statistic AS VS Percent: 1 %
Brady Statistic RA Percent Paced: 96 %
Brady Statistic RV Percent Paced: 99 %
Date Time Interrogation Session: 20200604060020
Implantable Lead Implant Date: 20110225
Implantable Lead Implant Date: 20110225
Implantable Lead Location: 753859
Implantable Lead Location: 753860
Implantable Pulse Generator Implant Date: 20200306
Lead Channel Impedance Value: 410 Ohm
Lead Channel Impedance Value: 450 Ohm
Lead Channel Pacing Threshold Amplitude: 0.5 V
Lead Channel Pacing Threshold Amplitude: 0.625 V
Lead Channel Pacing Threshold Pulse Width: 0.4 ms
Lead Channel Pacing Threshold Pulse Width: 0.4 ms
Lead Channel Sensing Intrinsic Amplitude: 1.9 mV
Lead Channel Setting Pacing Amplitude: 0.875
Lead Channel Setting Pacing Amplitude: 1.5 V
Lead Channel Setting Pacing Pulse Width: 0.4 ms
Lead Channel Setting Sensing Sensitivity: 4 mV
Pulse Gen Model: 2272
Pulse Gen Serial Number: 9114794

## 2019-03-04 DIAGNOSIS — I10 Essential (primary) hypertension: Secondary | ICD-10-CM | POA: Diagnosis not present

## 2019-03-05 DIAGNOSIS — I10 Essential (primary) hypertension: Secondary | ICD-10-CM | POA: Diagnosis not present

## 2019-03-06 DIAGNOSIS — I10 Essential (primary) hypertension: Secondary | ICD-10-CM | POA: Diagnosis not present

## 2019-03-07 ENCOUNTER — Encounter: Payer: Self-pay | Admitting: Orthopedic Surgery

## 2019-03-07 ENCOUNTER — Other Ambulatory Visit: Payer: Self-pay

## 2019-03-07 ENCOUNTER — Ambulatory Visit (INDEPENDENT_AMBULATORY_CARE_PROVIDER_SITE_OTHER): Payer: Medicaid Other | Admitting: Orthopedic Surgery

## 2019-03-07 ENCOUNTER — Other Ambulatory Visit: Payer: Self-pay | Admitting: Internal Medicine

## 2019-03-07 DIAGNOSIS — I10 Essential (primary) hypertension: Secondary | ICD-10-CM | POA: Diagnosis not present

## 2019-03-07 DIAGNOSIS — Z96612 Presence of left artificial shoulder joint: Secondary | ICD-10-CM

## 2019-03-07 DIAGNOSIS — Z7689 Persons encountering health services in other specified circumstances: Secondary | ICD-10-CM | POA: Diagnosis not present
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
## 2019-03-08 DIAGNOSIS — I10 Essential (primary) hypertension: Secondary | ICD-10-CM | POA: Diagnosis not present

## 2019-03-09 DIAGNOSIS — I10 Essential (primary) hypertension: Secondary | ICD-10-CM | POA: Diagnosis not present

## 2019-03-10 DIAGNOSIS — I10 Essential (primary) hypertension: Secondary | ICD-10-CM | POA: Diagnosis not present

## 2019-03-10 NOTE — Progress Notes (Signed)
Remote pacemaker transmission.   

## 2019-03-11 DIAGNOSIS — I10 Essential (primary) hypertension: Secondary | ICD-10-CM | POA: Diagnosis not present

## 2019-03-12 DIAGNOSIS — I10 Essential (primary) hypertension: Secondary | ICD-10-CM | POA: Diagnosis not present

## 2019-03-13 DIAGNOSIS — I10 Essential (primary) hypertension: Secondary | ICD-10-CM | POA: Diagnosis not present

## 2019-03-14 DIAGNOSIS — I10 Essential (primary) hypertension: Secondary | ICD-10-CM | POA: Diagnosis not present

## 2019-03-15 ENCOUNTER — Encounter: Payer: Medicaid Other | Admitting: Internal Medicine

## 2019-03-15 DIAGNOSIS — I10 Essential (primary) hypertension: Secondary | ICD-10-CM | POA: Diagnosis not present

## 2019-03-16 DIAGNOSIS — I10 Essential (primary) hypertension: Secondary | ICD-10-CM | POA: Diagnosis not present

## 2019-03-17 ENCOUNTER — Encounter: Payer: Medicaid Other | Admitting: Internal Medicine

## 2019-03-17 DIAGNOSIS — I10 Essential (primary) hypertension: Secondary | ICD-10-CM | POA: Diagnosis not present

## 2019-03-18 DIAGNOSIS — I10 Essential (primary) hypertension: Secondary | ICD-10-CM | POA: Diagnosis not present

## 2019-03-19 ENCOUNTER — Encounter: Payer: Self-pay | Admitting: *Deleted

## 2019-03-19 DIAGNOSIS — I10 Essential (primary) hypertension: Secondary | ICD-10-CM | POA: Diagnosis not present

## 2019-03-20 DIAGNOSIS — I10 Essential (primary) hypertension: Secondary | ICD-10-CM | POA: Diagnosis not present

## 2019-03-21 DIAGNOSIS — I10 Essential (primary) hypertension: Secondary | ICD-10-CM | POA: Diagnosis not present

## 2019-03-22 ENCOUNTER — Telehealth: Payer: Self-pay

## 2019-03-22 DIAGNOSIS — I10 Essential (primary) hypertension: Secondary | ICD-10-CM | POA: Diagnosis not present

## 2019-03-22 NOTE — Telephone Encounter (Signed)
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
## 2019-03-23 ENCOUNTER — Encounter: Payer: Self-pay | Admitting: Nurse Practitioner

## 2019-03-23 ENCOUNTER — Ambulatory Visit (INDEPENDENT_AMBULATORY_CARE_PROVIDER_SITE_OTHER): Payer: Medicaid Other | Admitting: Nurse Practitioner

## 2019-03-23 ENCOUNTER — Other Ambulatory Visit: Payer: Self-pay

## 2019-03-23 VITALS — BP 144/70 | HR 59 | Ht 61.0 in

## 2019-03-23 DIAGNOSIS — I442 Atrioventricular block, complete: Secondary | ICD-10-CM | POA: Diagnosis not present

## 2019-03-23 DIAGNOSIS — I10 Essential (primary) hypertension: Secondary | ICD-10-CM

## 2019-03-23 DIAGNOSIS — Z7689 Persons encountering health services in other specified circumstances: Secondary | ICD-10-CM | POA: Diagnosis not present

## 2019-03-23 NOTE — Patient Instructions (Signed)
Medication Instructions:  NONE If you need a refill on your cardiac medications before your next appointment, please call your pharmacy.   Lab work: NONE If you have labs (blood work) drawn today and your tests are completely normal, you will receive your results only by: Marland Kitchen MyChart Message (if you have MyChart) OR . A paper copy in the mail If you have any lab test that is abnormal or we need to change your treatment, we will call you to review the results.  Testing/Procedures: NONE  Follow-Up: At [facility], you and your health needs are our priority.  As part of our continuing mission to provide you with exceptional heart care, we have created designated Provider Care Teams.  These Care Teams include your primary Cardiologist (physician) and Advanced Practice Providers (APPs -  Physician Assistants and Nurse Practitioners) who all work together to provide you with the care you need, when you need it. You will need a follow up appointment in 1 years.  Please call our office 2 months in advance to schedule this appointment.  You may see Dr Lovena Le or one of the following Advanced Practice Providers on your designated Care Team:   Chanetta Marshall, NP . Tommye Standard, PA-C  Any Other Special Instructions Will Be Listed Below (If Applicable). Remote monitoring is used to monitor your Pacemaker  from home. This monitoring reduces the number of office visits required to check your device to one time per year. It allows [place] to keep an eye on the functioning of your device to ensure it is working properly. You are scheduled for a device check from home on 06/02/19. You may send your transmission at any time that day. If you have a wireless device, the transmission will be sent automatically. After your physician reviews your transmission, you will receive a postcard with your next transmission date.

## 2019-03-24 DIAGNOSIS — I10 Essential (primary) hypertension: Secondary | ICD-10-CM | POA: Diagnosis not present

## 2019-03-24 LAB — CUP PACEART INCLINIC DEVICE CHECK
Battery Remaining Longevity: 120 mo
Battery Voltage: 3.02 V
Brady Statistic RA Percent Paced: 96 %
Brady Statistic RV Percent Paced: 99.99 %
Date Time Interrogation Session: 20200624135411
Implantable Lead Implant Date: 20110225
Implantable Lead Implant Date: 20110225
Implantable Lead Location: 753859
Implantable Lead Location: 753860
Implantable Pulse Generator Implant Date: 20200306
Lead Channel Impedance Value: 450 Ohm
Lead Channel Impedance Value: 450 Ohm
Lead Channel Pacing Threshold Amplitude: 0.5 V
Lead Channel Pacing Threshold Amplitude: 0.75 V
Lead Channel Pacing Threshold Pulse Width: 0.4 ms
Lead Channel Pacing Threshold Pulse Width: 0.4 ms
Lead Channel Sensing Intrinsic Amplitude: 2.4 mV
Lead Channel Setting Pacing Amplitude: 1 V
Lead Channel Setting Pacing Amplitude: 1.5 V
Lead Channel Setting Pacing Pulse Width: 0.4 ms
Lead Channel Setting Sensing Sensitivity: 4 mV
Pulse Gen Model: 2272
Pulse Gen Serial Number: 9114794

## 2019-03-25 ENCOUNTER — Other Ambulatory Visit: Payer: Self-pay | Admitting: Internal Medicine

## 2019-03-25 DIAGNOSIS — Z1231 Encounter for screening mammogram for malignant neoplasm of breast: Secondary | ICD-10-CM

## 2019-03-25 DIAGNOSIS — I10 Essential (primary) hypertension: Secondary | ICD-10-CM | POA: Diagnosis not present

## 2019-03-26 DIAGNOSIS — I10 Essential (primary) hypertension: Secondary | ICD-10-CM | POA: Diagnosis not present

## 2019-03-27 DIAGNOSIS — I10 Essential (primary) hypertension: Secondary | ICD-10-CM | POA: Diagnosis not present

## 2019-03-28 DIAGNOSIS — I10 Essential (primary) hypertension: Secondary | ICD-10-CM | POA: Diagnosis not present

## 2019-03-29 DIAGNOSIS — I10 Essential (primary) hypertension: Secondary | ICD-10-CM | POA: Diagnosis not present

## 2019-03-30 DIAGNOSIS — I10 Essential (primary) hypertension: Secondary | ICD-10-CM | POA: Diagnosis not present

## 2019-03-31 DIAGNOSIS — I10 Essential (primary) hypertension: Secondary | ICD-10-CM | POA: Diagnosis not present

## 2019-04-01 ENCOUNTER — Other Ambulatory Visit: Payer: Self-pay | Admitting: Internal Medicine

## 2019-04-01 DIAGNOSIS — I10 Essential (primary) hypertension: Secondary | ICD-10-CM | POA: Diagnosis not present

## 2019-04-02 DIAGNOSIS — I10 Essential (primary) hypertension: Secondary | ICD-10-CM | POA: Diagnosis not present

## 2019-04-03 DIAGNOSIS — I10 Essential (primary) hypertension: Secondary | ICD-10-CM | POA: Diagnosis not present

## 2019-04-04 DIAGNOSIS — I10 Essential (primary) hypertension: Secondary | ICD-10-CM | POA: Diagnosis not present

## 2019-04-05 DIAGNOSIS — Z961 Presence of intraocular lens: Secondary | ICD-10-CM | POA: Diagnosis not present

## 2019-04-05 DIAGNOSIS — Z7689 Persons encountering health services in other specified circumstances: Secondary | ICD-10-CM | POA: Diagnosis not present

## 2019-04-05 DIAGNOSIS — H25811 Combined forms of age-related cataract, right eye: Secondary | ICD-10-CM | POA: Diagnosis not present

## 2019-04-05 DIAGNOSIS — I10 Essential (primary) hypertension: Secondary | ICD-10-CM | POA: Diagnosis not present

## 2019-04-05 DIAGNOSIS — H40023 Open angle with borderline findings, high risk, bilateral: Secondary | ICD-10-CM | POA: Diagnosis not present

## 2019-04-06 DIAGNOSIS — I10 Essential (primary) hypertension: Secondary | ICD-10-CM | POA: Diagnosis not present

## 2019-04-07 DIAGNOSIS — I10 Essential (primary) hypertension: Secondary | ICD-10-CM | POA: Diagnosis not present

## 2019-04-08 DIAGNOSIS — I10 Essential (primary) hypertension: Secondary | ICD-10-CM | POA: Diagnosis not present

## 2019-04-09 DIAGNOSIS — I10 Essential (primary) hypertension: Secondary | ICD-10-CM | POA: Diagnosis not present

## 2019-04-10 DIAGNOSIS — I10 Essential (primary) hypertension: Secondary | ICD-10-CM | POA: Diagnosis not present

## 2019-04-11 DIAGNOSIS — I10 Essential (primary) hypertension: Secondary | ICD-10-CM | POA: Diagnosis not present

## 2019-04-12 DIAGNOSIS — I10 Essential (primary) hypertension: Secondary | ICD-10-CM | POA: Diagnosis not present

## 2019-04-13 DIAGNOSIS — I10 Essential (primary) hypertension: Secondary | ICD-10-CM | POA: Diagnosis not present

## 2019-04-14 DIAGNOSIS — I10 Essential (primary) hypertension: Secondary | ICD-10-CM | POA: Diagnosis not present

## 2019-04-15 DIAGNOSIS — I10 Essential (primary) hypertension: Secondary | ICD-10-CM | POA: Diagnosis not present

## 2019-04-16 DIAGNOSIS — I10 Essential (primary) hypertension: Secondary | ICD-10-CM | POA: Diagnosis not present

## 2019-04-17 DIAGNOSIS — I10 Essential (primary) hypertension: Secondary | ICD-10-CM | POA: Diagnosis not present

## 2019-04-18 DIAGNOSIS — I10 Essential (primary) hypertension: Secondary | ICD-10-CM | POA: Diagnosis not present

## 2019-04-19 DIAGNOSIS — I10 Essential (primary) hypertension: Secondary | ICD-10-CM | POA: Diagnosis not present

## 2019-04-20 ENCOUNTER — Encounter: Payer: Self-pay | Admitting: Podiatry

## 2019-04-20 ENCOUNTER — Ambulatory Visit: Payer: Medicaid Other | Admitting: Podiatry

## 2019-04-20 ENCOUNTER — Other Ambulatory Visit: Payer: Self-pay

## 2019-04-20 VITALS — Temp 98.1°F

## 2019-04-20 DIAGNOSIS — E0843 Diabetes mellitus due to underlying condition with diabetic autonomic (poly)neuropathy: Secondary | ICD-10-CM

## 2019-04-20 DIAGNOSIS — E1151 Type 2 diabetes mellitus with diabetic peripheral angiopathy without gangrene: Secondary | ICD-10-CM

## 2019-04-20 DIAGNOSIS — Z89511 Acquired absence of right leg below knee: Secondary | ICD-10-CM

## 2019-04-20 DIAGNOSIS — Z89512 Acquired absence of left leg below knee: Secondary | ICD-10-CM | POA: Insufficient documentation

## 2019-04-20 DIAGNOSIS — Z7689 Persons encountering health services in other specified circumstances: Secondary | ICD-10-CM | POA: Diagnosis not present

## 2019-04-20 DIAGNOSIS — M79676 Pain in unspecified toe(s): Secondary | ICD-10-CM

## 2019-04-20 DIAGNOSIS — B351 Tinea unguium: Secondary | ICD-10-CM | POA: Diagnosis not present

## 2019-04-20 DIAGNOSIS — Q828 Other specified congenital malformations of skin: Secondary | ICD-10-CM

## 2019-04-20 DIAGNOSIS — I10 Essential (primary) hypertension: Secondary | ICD-10-CM | POA: Diagnosis not present

## 2019-04-20 HISTORY — DX: Other specified congenital malformations of skin: Q82.8

## 2019-04-20 NOTE — Progress Notes (Signed)
Complaint:  Visit Type: Patient returns to my office for continued preventative foot care services. Complaint: Patient states" my nails have grown long and thick and become painful to walk and wear shoes on her right foot."   Patient has been diagnosed with DM with amputation BK left leg. This patient has a painful callus under the outside ball of her right foot.  This patient presents for preventative foot care services. No changes to ROS  Podiatric Exam: Vascular: dorsalis pedis and posterior tibial pulses are absent right . Capillary return is immediate. Cold feet.. Skin turgor WNL  Sensorium: Diminished Semmes Weinstein monofilament test. Diminished  tactile sensation bilaterally. Nail Exam: Pt has thick disfigured discolored nails with subungual debris noted right  entire nail hallux through fifth toenails Ulcer Exam: There is no evidence of ulcer or pre-ulcerative changes or infection. Orthopedic Exam: Muscle tone and strength are WNL. No limitations in general ROM. No crepitus or effusions noted. Foot type and digits show no abnormalities. Bony prominences are unremarkable. BK amputation left foot.   Skin: No Porokeratosis. No infection or ulcers.  Porokeratosis sub 5th right foot.  Diagnosis:  Onychomycosis, , Pain in right toe  Porokeratosis sub 5 right foot.  Treatment & Plan Procedures and Treatment: Consent by patient was obtained for treatment procedures.   Debridement of mycotic and hypertrophic toenails, 1 through 5 bilateral and clearing of subungual debris. No ulceration, no infection noted. Debride porokeratosis  Right foot. Return Visit-Office Procedure: Patient instructed to return to the office for a follow up visit 3 months for continued evaluation and treatment.    Gardiner Barefoot DPM

## 2019-04-21 DIAGNOSIS — I10 Essential (primary) hypertension: Secondary | ICD-10-CM | POA: Diagnosis not present

## 2019-04-22 DIAGNOSIS — I10 Essential (primary) hypertension: Secondary | ICD-10-CM | POA: Diagnosis not present

## 2019-04-23 DIAGNOSIS — I10 Essential (primary) hypertension: Secondary | ICD-10-CM | POA: Diagnosis not present

## 2019-04-24 DIAGNOSIS — I10 Essential (primary) hypertension: Secondary | ICD-10-CM | POA: Diagnosis not present

## 2019-04-25 DIAGNOSIS — I10 Essential (primary) hypertension: Secondary | ICD-10-CM | POA: Diagnosis not present

## 2019-04-26 DIAGNOSIS — I10 Essential (primary) hypertension: Secondary | ICD-10-CM | POA: Diagnosis not present

## 2019-04-27 DIAGNOSIS — I10 Essential (primary) hypertension: Secondary | ICD-10-CM | POA: Diagnosis not present

## 2019-04-28 DIAGNOSIS — I10 Essential (primary) hypertension: Secondary | ICD-10-CM | POA: Diagnosis not present

## 2019-04-29 DIAGNOSIS — I10 Essential (primary) hypertension: Secondary | ICD-10-CM | POA: Diagnosis not present

## 2019-04-30 DIAGNOSIS — I10 Essential (primary) hypertension: Secondary | ICD-10-CM | POA: Diagnosis not present

## 2019-05-01 DIAGNOSIS — I10 Essential (primary) hypertension: Secondary | ICD-10-CM | POA: Diagnosis not present

## 2019-05-02 DIAGNOSIS — I10 Essential (primary) hypertension: Secondary | ICD-10-CM | POA: Diagnosis not present

## 2019-05-03 DIAGNOSIS — I10 Essential (primary) hypertension: Secondary | ICD-10-CM | POA: Diagnosis not present

## 2019-05-04 DIAGNOSIS — I10 Essential (primary) hypertension: Secondary | ICD-10-CM | POA: Diagnosis not present

## 2019-05-05 DIAGNOSIS — I10 Essential (primary) hypertension: Secondary | ICD-10-CM | POA: Diagnosis not present

## 2019-05-06 DIAGNOSIS — I10 Essential (primary) hypertension: Secondary | ICD-10-CM | POA: Diagnosis not present

## 2019-05-07 DIAGNOSIS — I10 Essential (primary) hypertension: Secondary | ICD-10-CM | POA: Diagnosis not present

## 2019-05-08 DIAGNOSIS — I10 Essential (primary) hypertension: Secondary | ICD-10-CM | POA: Diagnosis not present

## 2019-05-09 DIAGNOSIS — I10 Essential (primary) hypertension: Secondary | ICD-10-CM | POA: Diagnosis not present

## 2019-05-10 DIAGNOSIS — I10 Essential (primary) hypertension: Secondary | ICD-10-CM | POA: Diagnosis not present

## 2019-05-11 ENCOUNTER — Ambulatory Visit
Admission: RE | Admit: 2019-05-11 | Discharge: 2019-05-11 | Disposition: A | Discharge status: Home / Self Care | Payer: Medicaid Other | Source: Ambulatory Visit | Attending: Internal Medicine | Admitting: Internal Medicine

## 2019-05-11 ENCOUNTER — Other Ambulatory Visit: Payer: Self-pay

## 2019-05-11 DIAGNOSIS — Z1231 Encounter for screening mammogram for malignant neoplasm of breast: Secondary | ICD-10-CM | POA: Diagnosis not present

## 2019-05-11 DIAGNOSIS — I10 Essential (primary) hypertension: Secondary | ICD-10-CM | POA: Diagnosis not present

## 2019-05-11 DIAGNOSIS — Z7689 Persons encountering health services in other specified circumstances: Secondary | ICD-10-CM | POA: Diagnosis not present

## 2019-05-12 DIAGNOSIS — I10 Essential (primary) hypertension: Secondary | ICD-10-CM | POA: Diagnosis not present

## 2019-05-13 DIAGNOSIS — I10 Essential (primary) hypertension: Secondary | ICD-10-CM | POA: Diagnosis not present

## 2019-05-14 DIAGNOSIS — I10 Essential (primary) hypertension: Secondary | ICD-10-CM | POA: Diagnosis not present

## 2019-05-15 DIAGNOSIS — I10 Essential (primary) hypertension: Secondary | ICD-10-CM | POA: Diagnosis not present

## 2019-05-16 DIAGNOSIS — I10 Essential (primary) hypertension: Secondary | ICD-10-CM | POA: Diagnosis not present

## 2019-05-17 DIAGNOSIS — I10 Essential (primary) hypertension: Secondary | ICD-10-CM | POA: Diagnosis not present

## 2019-05-18 DIAGNOSIS — I10 Essential (primary) hypertension: Secondary | ICD-10-CM | POA: Diagnosis not present

## 2019-05-19 DIAGNOSIS — I10 Essential (primary) hypertension: Secondary | ICD-10-CM | POA: Diagnosis not present

## 2019-05-20 DIAGNOSIS — I10 Essential (primary) hypertension: Secondary | ICD-10-CM | POA: Diagnosis not present

## 2019-05-21 DIAGNOSIS — I10 Essential (primary) hypertension: Secondary | ICD-10-CM | POA: Diagnosis not present

## 2019-05-22 DIAGNOSIS — I10 Essential (primary) hypertension: Secondary | ICD-10-CM | POA: Diagnosis not present

## 2019-05-23 DIAGNOSIS — I10 Essential (primary) hypertension: Secondary | ICD-10-CM | POA: Diagnosis not present

## 2019-05-24 DIAGNOSIS — I10 Essential (primary) hypertension: Secondary | ICD-10-CM | POA: Diagnosis not present

## 2019-05-25 DIAGNOSIS — I10 Essential (primary) hypertension: Secondary | ICD-10-CM | POA: Diagnosis not present

## 2019-05-26 ENCOUNTER — Other Ambulatory Visit: Payer: Self-pay

## 2019-05-26 ENCOUNTER — Encounter: Payer: Self-pay | Admitting: Internal Medicine

## 2019-05-26 ENCOUNTER — Ambulatory Visit: Payer: Medicaid Other | Admitting: Internal Medicine

## 2019-05-26 VITALS — BP 166/61 | HR 60 | Temp 97.8°F | Ht 61.0 in | Wt 124.9 lb

## 2019-05-26 DIAGNOSIS — Z89512 Acquired absence of left leg below knee: Secondary | ICD-10-CM | POA: Diagnosis not present

## 2019-05-26 DIAGNOSIS — M25512 Pain in left shoulder: Secondary | ICD-10-CM | POA: Diagnosis not present

## 2019-05-26 DIAGNOSIS — Z96612 Presence of left artificial shoulder joint: Secondary | ICD-10-CM

## 2019-05-26 DIAGNOSIS — I10 Essential (primary) hypertension: Secondary | ICD-10-CM | POA: Diagnosis not present

## 2019-05-26 DIAGNOSIS — Z79899 Other long term (current) drug therapy: Secondary | ICD-10-CM | POA: Diagnosis not present

## 2019-05-26 DIAGNOSIS — Z7689 Persons encountering health services in other specified circumstances: Secondary | ICD-10-CM | POA: Diagnosis not present

## 2019-05-27 DIAGNOSIS — I10 Essential (primary) hypertension: Secondary | ICD-10-CM | POA: Diagnosis not present

## 2019-05-28 DIAGNOSIS — I10 Essential (primary) hypertension: Secondary | ICD-10-CM | POA: Diagnosis not present

## 2019-05-29 DIAGNOSIS — I10 Essential (primary) hypertension: Secondary | ICD-10-CM | POA: Diagnosis not present

## 2019-05-30 ENCOUNTER — Encounter: Payer: Self-pay | Admitting: Internal Medicine

## 2019-05-30 DIAGNOSIS — I10 Essential (primary) hypertension: Secondary | ICD-10-CM | POA: Diagnosis not present

## 2019-05-30 NOTE — Progress Notes (Signed)
Internal Medicine Clinic Attending  I saw and evaluated the patient.  I personally confirmed the key portions of the history and exam documented by Dr. [name] and I reviewed pertinent patient test results.  The assessment, diagnosis, and plan were formulated together and I agree with the documentation in the resident's note.      We also offered the patient other topical options including capsacin and lidocaine  In addition to the options described in Dr. Sammie Bench note but she did not want to try these.

## 2019-05-30 NOTE — Assessment & Plan Note (Signed)
Patient status post reverse arthroplasty of left shoulder on 06/02/2018.  She was referred back to Dr. Nicki Reaper on 03/07/2019 for a follow-up for her left shoulder.  At that time patient had good left shoulder function with roughly full range of motion in all directions and good strength.  She presents today for pain in her left shoulder.  Patient has a BKA on the left and uses her left shoulder to ambulate with a cane. Patient states that her pain limits her mobility and impacts her daily life making it difficult to walk with her cane.  The patient is currently taking Voltaren 1% gel for her knee, gabapentin 300 mg, and Tylenol 500 mg.  She was prescribed a short course of opioid pain medication in the past, but has not been on this medication for a year.  On physical exam, the patient has no bony tenderness or crepitus, no joint effusions or warmth, and good passive range of motion. When asked if she has been using the Voltaren gel for her shoulder pain as well, she emphatically states that she does not want to use Voltaren gel for her shoulder because it was specifically prescribed for her knee. She also believes that the Voltaren gel does not work well.  I counseled her on the use of Tylenol to help her pain, but she states that Tylenol upsets her stomach.  I told her that I do not believe that an opioid pain medication is warranted at this time and that we can refer her back to orthopedics or to pain management for additional evaluation and management.  The patient became upset and stated "this was waste of my time".  She denied further counseling or lab work and left the appointment.
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
## 2019-05-31 DIAGNOSIS — I10 Essential (primary) hypertension: Secondary | ICD-10-CM | POA: Diagnosis not present

## 2019-06-01 DIAGNOSIS — I10 Essential (primary) hypertension: Secondary | ICD-10-CM | POA: Diagnosis not present

## 2019-06-02 ENCOUNTER — Ambulatory Visit (INDEPENDENT_AMBULATORY_CARE_PROVIDER_SITE_OTHER): Payer: Medicaid Other | Admitting: *Deleted

## 2019-06-02 DIAGNOSIS — I10 Essential (primary) hypertension: Secondary | ICD-10-CM | POA: Diagnosis not present

## 2019-06-02 DIAGNOSIS — I442 Atrioventricular block, complete: Secondary | ICD-10-CM | POA: Diagnosis not present

## 2019-06-02 LAB — CUP PACEART REMOTE DEVICE CHECK
Battery Remaining Longevity: 119 mo
Battery Remaining Percentage: 95.5 %
Battery Voltage: 3.01 V
Brady Statistic AP VP Percent: 96 %
Brady Statistic AP VS Percent: 1 %
Brady Statistic AS VP Percent: 4 %
Brady Statistic AS VS Percent: 1 %
Brady Statistic RA Percent Paced: 96 %
Brady Statistic RV Percent Paced: 99 %
Date Time Interrogation Session: 20200903091118
Implantable Lead Implant Date: 20110225
Implantable Lead Implant Date: 20110225
Implantable Lead Location: 753859
Implantable Lead Location: 753860
Implantable Pulse Generator Implant Date: 20200306
Lead Channel Impedance Value: 400 Ohm
Lead Channel Impedance Value: 400 Ohm
Lead Channel Pacing Threshold Amplitude: 0.5 V
Lead Channel Pacing Threshold Amplitude: 0.75 V
Lead Channel Pacing Threshold Pulse Width: 0.4 ms
Lead Channel Pacing Threshold Pulse Width: 0.4 ms
Lead Channel Sensing Intrinsic Amplitude: 2.6 mV
Lead Channel Setting Pacing Amplitude: 1 V
Lead Channel Setting Pacing Amplitude: 1.5 V
Lead Channel Setting Pacing Pulse Width: 0.4 ms
Lead Channel Setting Sensing Sensitivity: 4 mV
Pulse Gen Model: 2272
Pulse Gen Serial Number: 9114794

## 2019-06-03 DIAGNOSIS — I10 Essential (primary) hypertension: Secondary | ICD-10-CM | POA: Diagnosis not present

## 2019-06-04 DIAGNOSIS — I10 Essential (primary) hypertension: Secondary | ICD-10-CM | POA: Diagnosis not present

## 2019-06-05 DIAGNOSIS — I10 Essential (primary) hypertension: Secondary | ICD-10-CM | POA: Diagnosis not present

## 2019-06-06 DIAGNOSIS — I10 Essential (primary) hypertension: Secondary | ICD-10-CM | POA: Diagnosis not present

## 2019-06-07 DIAGNOSIS — I10 Essential (primary) hypertension: Secondary | ICD-10-CM | POA: Diagnosis not present

## 2019-06-08 DIAGNOSIS — I10 Essential (primary) hypertension: Secondary | ICD-10-CM | POA: Diagnosis not present

## 2019-06-09 DIAGNOSIS — I10 Essential (primary) hypertension: Secondary | ICD-10-CM | POA: Diagnosis not present

## 2019-06-10 DIAGNOSIS — I10 Essential (primary) hypertension: Secondary | ICD-10-CM | POA: Diagnosis not present

## 2019-06-11 DIAGNOSIS — I10 Essential (primary) hypertension: Secondary | ICD-10-CM | POA: Diagnosis not present

## 2019-06-12 DIAGNOSIS — I10 Essential (primary) hypertension: Secondary | ICD-10-CM | POA: Diagnosis not present

## 2019-06-13 DIAGNOSIS — I10 Essential (primary) hypertension: Secondary | ICD-10-CM | POA: Diagnosis not present

## 2019-06-14 DIAGNOSIS — I10 Essential (primary) hypertension: Secondary | ICD-10-CM | POA: Diagnosis not present

## 2019-06-15 DIAGNOSIS — I10 Essential (primary) hypertension: Secondary | ICD-10-CM | POA: Diagnosis not present

## 2019-06-16 ENCOUNTER — Encounter: Payer: Self-pay | Admitting: Cardiology

## 2019-06-16 DIAGNOSIS — I10 Essential (primary) hypertension: Secondary | ICD-10-CM | POA: Diagnosis not present

## 2019-06-16 NOTE — Progress Notes (Signed)
Remote pacemaker transmission.   

## 2019-06-17 DIAGNOSIS — I10 Essential (primary) hypertension: Secondary | ICD-10-CM | POA: Diagnosis not present

## 2019-06-18 DIAGNOSIS — I10 Essential (primary) hypertension: Secondary | ICD-10-CM | POA: Diagnosis not present

## 2019-06-19 DIAGNOSIS — I10 Essential (primary) hypertension: Secondary | ICD-10-CM | POA: Diagnosis not present

## 2019-06-20 DIAGNOSIS — I10 Essential (primary) hypertension: Secondary | ICD-10-CM | POA: Diagnosis not present

## 2019-06-21 DIAGNOSIS — I10 Essential (primary) hypertension: Secondary | ICD-10-CM | POA: Diagnosis not present

## 2019-06-22 DIAGNOSIS — I10 Essential (primary) hypertension: Secondary | ICD-10-CM | POA: Diagnosis not present

## 2019-06-23 DIAGNOSIS — I10 Essential (primary) hypertension: Secondary | ICD-10-CM | POA: Diagnosis not present

## 2019-06-24 DIAGNOSIS — I10 Essential (primary) hypertension: Secondary | ICD-10-CM | POA: Diagnosis not present

## 2019-06-25 DIAGNOSIS — I10 Essential (primary) hypertension: Secondary | ICD-10-CM | POA: Diagnosis not present

## 2019-06-26 DIAGNOSIS — I10 Essential (primary) hypertension: Secondary | ICD-10-CM | POA: Diagnosis not present

## 2019-06-27 DIAGNOSIS — I10 Essential (primary) hypertension: Secondary | ICD-10-CM | POA: Diagnosis not present

## 2019-06-28 DIAGNOSIS — I10 Essential (primary) hypertension: Secondary | ICD-10-CM | POA: Diagnosis not present

## 2019-06-29 DIAGNOSIS — I10 Essential (primary) hypertension: Secondary | ICD-10-CM | POA: Diagnosis not present

## 2019-06-30 DIAGNOSIS — I10 Essential (primary) hypertension: Secondary | ICD-10-CM | POA: Diagnosis not present

## 2019-07-01 DIAGNOSIS — I10 Essential (primary) hypertension: Secondary | ICD-10-CM | POA: Diagnosis not present

## 2019-07-02 DIAGNOSIS — I10 Essential (primary) hypertension: Secondary | ICD-10-CM | POA: Diagnosis not present

## 2019-07-03 DIAGNOSIS — I10 Essential (primary) hypertension: Secondary | ICD-10-CM | POA: Diagnosis not present

## 2019-07-04 DIAGNOSIS — I10 Essential (primary) hypertension: Secondary | ICD-10-CM | POA: Diagnosis not present

## 2019-07-05 DIAGNOSIS — I10 Essential (primary) hypertension: Secondary | ICD-10-CM | POA: Diagnosis not present

## 2019-07-06 DIAGNOSIS — I10 Essential (primary) hypertension: Secondary | ICD-10-CM | POA: Diagnosis not present

## 2019-07-07 DIAGNOSIS — I10 Essential (primary) hypertension: Secondary | ICD-10-CM | POA: Diagnosis not present

## 2019-07-08 DIAGNOSIS — I10 Essential (primary) hypertension: Secondary | ICD-10-CM | POA: Diagnosis not present

## 2019-07-09 DIAGNOSIS — I10 Essential (primary) hypertension: Secondary | ICD-10-CM | POA: Diagnosis not present

## 2019-07-10 DIAGNOSIS — I10 Essential (primary) hypertension: Secondary | ICD-10-CM | POA: Diagnosis not present

## 2019-07-11 DIAGNOSIS — I10 Essential (primary) hypertension: Secondary | ICD-10-CM | POA: Diagnosis not present

## 2019-07-12 DIAGNOSIS — I10 Essential (primary) hypertension: Secondary | ICD-10-CM | POA: Diagnosis not present

## 2019-07-13 ENCOUNTER — Other Ambulatory Visit: Payer: Self-pay | Admitting: Internal Medicine

## 2019-07-13 DIAGNOSIS — I10 Essential (primary) hypertension: Secondary | ICD-10-CM | POA: Diagnosis not present

## 2019-07-14 DIAGNOSIS — I10 Essential (primary) hypertension: Secondary | ICD-10-CM | POA: Diagnosis not present

## 2019-07-15 DIAGNOSIS — I10 Essential (primary) hypertension: Secondary | ICD-10-CM | POA: Diagnosis not present

## 2019-07-16 DIAGNOSIS — I10 Essential (primary) hypertension: Secondary | ICD-10-CM | POA: Diagnosis not present

## 2019-07-17 DIAGNOSIS — I10 Essential (primary) hypertension: Secondary | ICD-10-CM | POA: Diagnosis not present

## 2019-07-18 DIAGNOSIS — I10 Essential (primary) hypertension: Secondary | ICD-10-CM | POA: Diagnosis not present

## 2019-07-19 DIAGNOSIS — I10 Essential (primary) hypertension: Secondary | ICD-10-CM | POA: Diagnosis not present

## 2019-07-20 DIAGNOSIS — I10 Essential (primary) hypertension: Secondary | ICD-10-CM | POA: Diagnosis not present

## 2019-07-21 DIAGNOSIS — I10 Essential (primary) hypertension: Secondary | ICD-10-CM | POA: Diagnosis not present

## 2019-07-22 ENCOUNTER — Encounter: Payer: Self-pay | Admitting: Podiatry

## 2019-07-22 ENCOUNTER — Other Ambulatory Visit: Payer: Self-pay

## 2019-07-22 ENCOUNTER — Ambulatory Visit: Payer: Medicaid Other | Admitting: Podiatry

## 2019-07-22 DIAGNOSIS — M79676 Pain in unspecified toe(s): Secondary | ICD-10-CM | POA: Diagnosis not present

## 2019-07-22 DIAGNOSIS — Z7689 Persons encountering health services in other specified circumstances: Secondary | ICD-10-CM | POA: Diagnosis not present

## 2019-07-22 DIAGNOSIS — I10 Essential (primary) hypertension: Secondary | ICD-10-CM | POA: Diagnosis not present

## 2019-07-22 DIAGNOSIS — E1151 Type 2 diabetes mellitus with diabetic peripheral angiopathy without gangrene: Secondary | ICD-10-CM

## 2019-07-22 DIAGNOSIS — B351 Tinea unguium: Secondary | ICD-10-CM | POA: Diagnosis not present

## 2019-07-22 DIAGNOSIS — E0843 Diabetes mellitus due to underlying condition with diabetic autonomic (poly)neuropathy: Secondary | ICD-10-CM

## 2019-07-22 DIAGNOSIS — Q828 Other specified congenital malformations of skin: Secondary | ICD-10-CM

## 2019-07-22 NOTE — Progress Notes (Signed)
Complaint:  Visit Type: Patient returns to my office for continued preventative foot care services. Complaint: Patient states" my nails have grown long and thick and become painful to walk and wear shoes on her right foot."   Patient has been diagnosed with DM with amputation BK left leg. This patient has a painful callus under the outside ball of her right foot.  This patient presents for preventative foot care services. No changes to ROS  Podiatric Exam: Vascular: dorsalis pedis and posterior tibial pulses are absent right . Capillary return is immediate. Cold feet.. Skin turgor WNL  Sensorium: Diminished Semmes Weinstein monofilament test. Diminished  tactile sensation bilaterally. Nail Exam: Pt has thick disfigured discolored nails with subungual debris noted right  entire nail hallux through fifth toenails Ulcer Exam: There is no evidence of ulcer or pre-ulcerative changes or infection. Orthopedic Exam: Muscle tone and strength are WNL. No limitations in general ROM. No crepitus or effusions noted. Foot type and digits show no abnormalities. Bony prominences are unremarkable. BK amputation left foot.   Skin: No Porokeratosis. No infection or ulcers.  Porokeratosis sub 5th right foot.  Diagnosis:  Onychomycosis, , Pain in right toe  Porokeratosis sub 5 right foot.  Treatment & Plan Procedures and Treatment: Consent by patient was obtained for treatment procedures.   Debridement of mycotic and hypertrophic toenails, 1 through 5 bilateral and clearing of subungual debris. No ulceration, no infection noted. Debride porokeratosis  Right foot. Return Visit-Office Procedure: Patient instructed to return to the office for a follow up visit 3 months for continued evaluation and treatment.    Leyba Theophilus Walz DPM 

## 2019-07-23 DIAGNOSIS — I10 Essential (primary) hypertension: Secondary | ICD-10-CM | POA: Diagnosis not present

## 2019-07-24 DIAGNOSIS — I10 Essential (primary) hypertension: Secondary | ICD-10-CM | POA: Diagnosis not present

## 2019-07-25 DIAGNOSIS — I10 Essential (primary) hypertension: Secondary | ICD-10-CM | POA: Diagnosis not present

## 2019-07-26 DIAGNOSIS — I10 Essential (primary) hypertension: Secondary | ICD-10-CM | POA: Diagnosis not present

## 2019-07-27 DIAGNOSIS — I10 Essential (primary) hypertension: Secondary | ICD-10-CM | POA: Diagnosis not present

## 2019-07-28 DIAGNOSIS — I10 Essential (primary) hypertension: Secondary | ICD-10-CM | POA: Diagnosis not present

## 2019-07-29 DIAGNOSIS — I10 Essential (primary) hypertension: Secondary | ICD-10-CM | POA: Diagnosis not present

## 2019-07-30 DIAGNOSIS — I10 Essential (primary) hypertension: Secondary | ICD-10-CM | POA: Diagnosis not present

## 2019-07-31 DIAGNOSIS — I10 Essential (primary) hypertension: Secondary | ICD-10-CM | POA: Diagnosis not present

## 2019-08-01 DIAGNOSIS — I10 Essential (primary) hypertension: Secondary | ICD-10-CM | POA: Diagnosis not present

## 2019-08-02 DIAGNOSIS — I10 Essential (primary) hypertension: Secondary | ICD-10-CM | POA: Diagnosis not present

## 2019-08-03 DIAGNOSIS — I10 Essential (primary) hypertension: Secondary | ICD-10-CM | POA: Diagnosis not present

## 2019-08-04 DIAGNOSIS — I10 Essential (primary) hypertension: Secondary | ICD-10-CM | POA: Diagnosis not present

## 2019-08-05 DIAGNOSIS — I10 Essential (primary) hypertension: Secondary | ICD-10-CM | POA: Diagnosis not present

## 2019-08-06 DIAGNOSIS — I10 Essential (primary) hypertension: Secondary | ICD-10-CM | POA: Diagnosis not present

## 2019-08-07 DIAGNOSIS — I10 Essential (primary) hypertension: Secondary | ICD-10-CM | POA: Diagnosis not present

## 2019-08-08 DIAGNOSIS — I10 Essential (primary) hypertension: Secondary | ICD-10-CM | POA: Diagnosis not present

## 2019-08-09 DIAGNOSIS — I10 Essential (primary) hypertension: Secondary | ICD-10-CM | POA: Diagnosis not present

## 2019-08-10 DIAGNOSIS — I10 Essential (primary) hypertension: Secondary | ICD-10-CM | POA: Diagnosis not present

## 2019-08-11 ENCOUNTER — Other Ambulatory Visit: Payer: Self-pay | Admitting: *Deleted

## 2019-08-11 DIAGNOSIS — I10 Essential (primary) hypertension: Secondary | ICD-10-CM | POA: Diagnosis not present

## 2019-08-11 MED ORDER — PANTOPRAZOLE SODIUM 40 MG PO TBEC: 40.0000 mg | DELAYED_RELEASE_TABLET | Freq: Every day | ORAL | 5 refills | Status: DC

## 2019-08-12 DIAGNOSIS — I10 Essential (primary) hypertension: Secondary | ICD-10-CM | POA: Diagnosis not present

## 2019-08-13 DIAGNOSIS — I10 Essential (primary) hypertension: Secondary | ICD-10-CM | POA: Diagnosis not present

## 2019-08-14 DIAGNOSIS — I10 Essential (primary) hypertension: Secondary | ICD-10-CM | POA: Diagnosis not present

## 2019-08-15 DIAGNOSIS — I10 Essential (primary) hypertension: Secondary | ICD-10-CM | POA: Diagnosis not present

## 2019-08-16 DIAGNOSIS — I10 Essential (primary) hypertension: Secondary | ICD-10-CM | POA: Diagnosis not present

## 2019-08-16 DIAGNOSIS — D649 Anemia, unspecified: Secondary | ICD-10-CM | POA: Diagnosis not present

## 2019-08-16 DIAGNOSIS — N184 Chronic kidney disease, stage 4 (severe): Secondary | ICD-10-CM | POA: Diagnosis not present

## 2019-08-16 DIAGNOSIS — N2581 Secondary hyperparathyroidism of renal origin: Secondary | ICD-10-CM | POA: Diagnosis not present

## 2019-08-17 DIAGNOSIS — I10 Essential (primary) hypertension: Secondary | ICD-10-CM | POA: Diagnosis not present

## 2019-08-18 DIAGNOSIS — I10 Essential (primary) hypertension: Secondary | ICD-10-CM | POA: Diagnosis not present

## 2019-08-19 DIAGNOSIS — I10 Essential (primary) hypertension: Secondary | ICD-10-CM | POA: Diagnosis not present

## 2019-08-20 DIAGNOSIS — I10 Essential (primary) hypertension: Secondary | ICD-10-CM | POA: Diagnosis not present

## 2019-08-21 DIAGNOSIS — I10 Essential (primary) hypertension: Secondary | ICD-10-CM | POA: Diagnosis not present

## 2019-08-22 DIAGNOSIS — I10 Essential (primary) hypertension: Secondary | ICD-10-CM | POA: Diagnosis not present

## 2019-08-23 DIAGNOSIS — I10 Essential (primary) hypertension: Secondary | ICD-10-CM | POA: Diagnosis not present

## 2019-08-24 DIAGNOSIS — I10 Essential (primary) hypertension: Secondary | ICD-10-CM | POA: Diagnosis not present

## 2019-08-25 DIAGNOSIS — I10 Essential (primary) hypertension: Secondary | ICD-10-CM | POA: Diagnosis not present

## 2019-08-26 DIAGNOSIS — I10 Essential (primary) hypertension: Secondary | ICD-10-CM | POA: Diagnosis not present

## 2019-08-27 DIAGNOSIS — I10 Essential (primary) hypertension: Secondary | ICD-10-CM | POA: Diagnosis not present

## 2019-08-28 DIAGNOSIS — I10 Essential (primary) hypertension: Secondary | ICD-10-CM | POA: Diagnosis not present

## 2019-08-29 DIAGNOSIS — I10 Essential (primary) hypertension: Secondary | ICD-10-CM | POA: Diagnosis not present

## 2019-08-30 DIAGNOSIS — I10 Essential (primary) hypertension: Secondary | ICD-10-CM | POA: Diagnosis not present

## 2019-08-31 DIAGNOSIS — I10 Essential (primary) hypertension: Secondary | ICD-10-CM | POA: Diagnosis not present

## 2019-09-01 ENCOUNTER — Ambulatory Visit (INDEPENDENT_AMBULATORY_CARE_PROVIDER_SITE_OTHER): Payer: Medicaid Other | Admitting: *Deleted

## 2019-09-01 DIAGNOSIS — I10 Essential (primary) hypertension: Secondary | ICD-10-CM | POA: Diagnosis not present

## 2019-09-01 DIAGNOSIS — Z950 Presence of cardiac pacemaker: Secondary | ICD-10-CM | POA: Diagnosis not present

## 2019-09-01 LAB — CUP PACEART REMOTE DEVICE CHECK
Battery Remaining Longevity: 116 mo
Battery Remaining Percentage: 95.5 %
Battery Voltage: 3.01 V
Brady Statistic AP VP Percent: 95 %
Brady Statistic AP VS Percent: 1 %
Brady Statistic AS VP Percent: 4.9 %
Brady Statistic AS VS Percent: 1 %
Brady Statistic RA Percent Paced: 95 %
Brady Statistic RV Percent Paced: 99 %
Date Time Interrogation Session: 20201203065419
Implantable Lead Implant Date: 20110225
Implantable Lead Implant Date: 20110225
Implantable Lead Location: 753859
Implantable Lead Location: 753860
Implantable Pulse Generator Implant Date: 20200306
Lead Channel Impedance Value: 390 Ohm
Lead Channel Impedance Value: 400 Ohm
Lead Channel Pacing Threshold Amplitude: 0.5 V
Lead Channel Pacing Threshold Amplitude: 0.75 V
Lead Channel Pacing Threshold Pulse Width: 0.4 ms
Lead Channel Pacing Threshold Pulse Width: 0.4 ms
Lead Channel Sensing Intrinsic Amplitude: 2.5 mV
Lead Channel Setting Pacing Amplitude: 1 V
Lead Channel Setting Pacing Amplitude: 1.5 V
Lead Channel Setting Pacing Pulse Width: 0.4 ms
Lead Channel Setting Sensing Sensitivity: 4 mV
Pulse Gen Model: 2272
Pulse Gen Serial Number: 9114794

## 2019-09-02 ENCOUNTER — Other Ambulatory Visit: Payer: Self-pay | Admitting: *Deleted

## 2019-09-02 DIAGNOSIS — M7502 Adhesive capsulitis of left shoulder: Secondary | ICD-10-CM

## 2019-09-02 DIAGNOSIS — I10 Essential (primary) hypertension: Secondary | ICD-10-CM

## 2019-09-02 MED ORDER — DICLOFENAC SODIUM 1 % EX GEL: 2.0000 g | Freq: Four times a day (QID) | CUTANEOUS | 0 refills | Status: DC

## 2019-09-03 DIAGNOSIS — I10 Essential (primary) hypertension: Secondary | ICD-10-CM | POA: Diagnosis not present

## 2019-09-04 DIAGNOSIS — I10 Essential (primary) hypertension: Secondary | ICD-10-CM | POA: Diagnosis not present

## 2019-09-05 DIAGNOSIS — I10 Essential (primary) hypertension: Secondary | ICD-10-CM | POA: Diagnosis not present

## 2019-09-06 DIAGNOSIS — I10 Essential (primary) hypertension: Secondary | ICD-10-CM | POA: Diagnosis not present

## 2019-09-07 DIAGNOSIS — I10 Essential (primary) hypertension: Secondary | ICD-10-CM | POA: Diagnosis not present

## 2019-09-08 DIAGNOSIS — I10 Essential (primary) hypertension: Secondary | ICD-10-CM | POA: Diagnosis not present

## 2019-09-09 DIAGNOSIS — I10 Essential (primary) hypertension: Secondary | ICD-10-CM | POA: Diagnosis not present

## 2019-09-10 DIAGNOSIS — I10 Essential (primary) hypertension: Secondary | ICD-10-CM | POA: Diagnosis not present

## 2019-09-11 DIAGNOSIS — I10 Essential (primary) hypertension: Secondary | ICD-10-CM | POA: Diagnosis not present

## 2019-09-12 DIAGNOSIS — I10 Essential (primary) hypertension: Secondary | ICD-10-CM | POA: Diagnosis not present

## 2019-09-13 DIAGNOSIS — I10 Essential (primary) hypertension: Secondary | ICD-10-CM | POA: Diagnosis not present

## 2019-09-14 DIAGNOSIS — I10 Essential (primary) hypertension: Secondary | ICD-10-CM | POA: Diagnosis not present

## 2019-09-15 DIAGNOSIS — I10 Essential (primary) hypertension: Secondary | ICD-10-CM | POA: Diagnosis not present

## 2019-09-16 DIAGNOSIS — I10 Essential (primary) hypertension: Secondary | ICD-10-CM | POA: Diagnosis not present

## 2019-09-17 DIAGNOSIS — I10 Essential (primary) hypertension: Secondary | ICD-10-CM | POA: Diagnosis not present

## 2019-09-18 DIAGNOSIS — I10 Essential (primary) hypertension: Secondary | ICD-10-CM | POA: Diagnosis not present

## 2019-09-19 DIAGNOSIS — I10 Essential (primary) hypertension: Secondary | ICD-10-CM | POA: Diagnosis not present

## 2019-09-20 DIAGNOSIS — I10 Essential (primary) hypertension: Secondary | ICD-10-CM | POA: Diagnosis not present

## 2019-09-21 DIAGNOSIS — I10 Essential (primary) hypertension: Secondary | ICD-10-CM | POA: Diagnosis not present

## 2019-09-22 DIAGNOSIS — I10 Essential (primary) hypertension: Secondary | ICD-10-CM | POA: Diagnosis not present

## 2019-09-23 DIAGNOSIS — I10 Essential (primary) hypertension: Secondary | ICD-10-CM | POA: Diagnosis not present

## 2019-09-24 DIAGNOSIS — I10 Essential (primary) hypertension: Secondary | ICD-10-CM | POA: Diagnosis not present

## 2019-09-25 DIAGNOSIS — I10 Essential (primary) hypertension: Secondary | ICD-10-CM | POA: Diagnosis not present

## 2019-09-26 DIAGNOSIS — I10 Essential (primary) hypertension: Secondary | ICD-10-CM | POA: Diagnosis not present

## 2019-09-27 DIAGNOSIS — I10 Essential (primary) hypertension: Secondary | ICD-10-CM | POA: Diagnosis not present

## 2019-09-27 NOTE — Progress Notes (Signed)
PPM remote 

## 2019-09-28 DIAGNOSIS — I10 Essential (primary) hypertension: Secondary | ICD-10-CM | POA: Diagnosis not present

## 2019-09-29 DIAGNOSIS — I10 Essential (primary) hypertension: Secondary | ICD-10-CM | POA: Diagnosis not present

## 2019-09-30 DIAGNOSIS — I10 Essential (primary) hypertension: Secondary | ICD-10-CM | POA: Diagnosis not present

## 2019-10-01 DIAGNOSIS — I10 Essential (primary) hypertension: Secondary | ICD-10-CM | POA: Diagnosis not present

## 2019-10-03 ENCOUNTER — Other Ambulatory Visit: Payer: Self-pay | Admitting: Internal Medicine

## 2019-10-04 DIAGNOSIS — I10 Essential (primary) hypertension: Secondary | ICD-10-CM | POA: Diagnosis not present

## 2019-10-06 DIAGNOSIS — I10 Essential (primary) hypertension: Secondary | ICD-10-CM | POA: Diagnosis not present

## 2019-10-07 DIAGNOSIS — I10 Essential (primary) hypertension: Secondary | ICD-10-CM | POA: Diagnosis not present

## 2019-10-09 DIAGNOSIS — I10 Essential (primary) hypertension: Secondary | ICD-10-CM | POA: Diagnosis not present

## 2019-10-10 DIAGNOSIS — I10 Essential (primary) hypertension: Secondary | ICD-10-CM | POA: Diagnosis not present

## 2019-10-11 DIAGNOSIS — I10 Essential (primary) hypertension: Secondary | ICD-10-CM | POA: Diagnosis not present

## 2019-10-12 DIAGNOSIS — I10 Essential (primary) hypertension: Secondary | ICD-10-CM | POA: Diagnosis not present

## 2019-10-13 DIAGNOSIS — I10 Essential (primary) hypertension: Secondary | ICD-10-CM | POA: Diagnosis not present

## 2019-10-14 DIAGNOSIS — I10 Essential (primary) hypertension: Secondary | ICD-10-CM | POA: Diagnosis not present

## 2019-10-15 DIAGNOSIS — I10 Essential (primary) hypertension: Secondary | ICD-10-CM | POA: Diagnosis not present

## 2019-10-17 DIAGNOSIS — I10 Essential (primary) hypertension: Secondary | ICD-10-CM | POA: Diagnosis not present

## 2019-10-18 DIAGNOSIS — I10 Essential (primary) hypertension: Secondary | ICD-10-CM | POA: Diagnosis not present

## 2019-10-19 ENCOUNTER — Other Ambulatory Visit: Payer: Self-pay

## 2019-10-19 DIAGNOSIS — I10 Essential (primary) hypertension: Secondary | ICD-10-CM | POA: Diagnosis not present

## 2019-10-19 MED ORDER — ALBUTEROL SULFATE HFA 108 (90 BASE) MCG/ACT IN AERS: INHALATION_SPRAY | RESPIRATORY_TRACT | 11 refills | Status: DC

## 2019-10-20 ENCOUNTER — Other Ambulatory Visit: Payer: Self-pay | Admitting: *Deleted

## 2019-10-20 DIAGNOSIS — I10 Essential (primary) hypertension: Secondary | ICD-10-CM | POA: Diagnosis not present

## 2019-10-20 DIAGNOSIS — I429 Cardiomyopathy, unspecified: Secondary | ICD-10-CM

## 2019-10-21 DIAGNOSIS — I10 Essential (primary) hypertension: Secondary | ICD-10-CM | POA: Diagnosis not present

## 2019-10-23 DIAGNOSIS — I10 Essential (primary) hypertension: Secondary | ICD-10-CM | POA: Diagnosis not present

## 2019-10-24 DIAGNOSIS — I10 Essential (primary) hypertension: Secondary | ICD-10-CM | POA: Diagnosis not present

## 2019-10-25 ENCOUNTER — Other Ambulatory Visit: Payer: Self-pay | Admitting: Internal Medicine

## 2019-10-25 DIAGNOSIS — I10 Essential (primary) hypertension: Secondary | ICD-10-CM | POA: Diagnosis not present

## 2019-10-25 MED ORDER — BIDIL 20-37.5 MG PO TABS: 1.0000 | ORAL_TABLET | Freq: Three times a day (TID) | ORAL | 2 refills | Status: DC

## 2019-10-26 ENCOUNTER — Other Ambulatory Visit: Payer: Self-pay

## 2019-10-26 ENCOUNTER — Encounter: Payer: Self-pay | Admitting: Podiatry

## 2019-10-26 ENCOUNTER — Ambulatory Visit: Payer: Medicaid Other | Admitting: Podiatry

## 2019-10-26 DIAGNOSIS — Z7689 Persons encountering health services in other specified circumstances: Secondary | ICD-10-CM | POA: Diagnosis not present

## 2019-10-26 DIAGNOSIS — E1151 Type 2 diabetes mellitus with diabetic peripheral angiopathy without gangrene: Secondary | ICD-10-CM

## 2019-10-26 DIAGNOSIS — M79676 Pain in unspecified toe(s): Secondary | ICD-10-CM | POA: Diagnosis not present

## 2019-10-26 DIAGNOSIS — I10 Essential (primary) hypertension: Secondary | ICD-10-CM | POA: Diagnosis not present

## 2019-10-26 DIAGNOSIS — B351 Tinea unguium: Secondary | ICD-10-CM | POA: Diagnosis not present

## 2019-10-26 DIAGNOSIS — Q828 Other specified congenital malformations of skin: Secondary | ICD-10-CM

## 2019-10-26 NOTE — Progress Notes (Signed)
Complaint:  Visit Type: Patient returns to my office for continued preventative foot care services. Complaint: Patient states" my nails have grown long and thick and become painful to walk and wear shoes on her right foot."   Patient has been diagnosed with DM with amputation BK left leg. This patient has a painful callus under the outside ball of her right foot.  This patient presents for preventative foot care services. No changes to ROS  Podiatric Exam: Vascular: dorsalis pedis and posterior tibial pulses are absent right . Capillary return is immediate. Cold feet.. Skin turgor WNL  Sensorium: Diminished Semmes Weinstein monofilament test. Diminished  tactile sensation bilaterally. Nail Exam: Pt has thick disfigured discolored nails with subungual debris noted right  entire nail hallux through fifth toenails Ulcer Exam: There is no evidence of ulcer or pre-ulcerative changes or infection. Orthopedic Exam: Muscle tone and strength are WNL. No limitations in general ROM. No crepitus or effusions noted. Foot type and digits show no abnormalities. Bony prominences are unremarkable. BK amputation left foot.   Skin: No Porokeratosis. No infection or ulcers.  Porokeratosis sub 5th right foot.  Diagnosis:  Onychomycosis, , Pain in right toe  Porokeratosis sub 5 right foot.  Treatment & Plan Procedures and Treatment: Consent by patient was obtained for treatment procedures.   Debridement of mycotic and hypertrophic toenails, 1 through 5 bilateral and clearing of subungual debris. No ulceration, no infection noted. Debride porokeratosis  Right foot. Return Visit-Office Procedure: Patient instructed to return to the office for a follow up visit 3 months for continued evaluation and treatment.    [place]una Virlee Stroschein DPM 

## 2019-10-27 DIAGNOSIS — H25811 Combined forms of age-related cataract, right eye: Secondary | ICD-10-CM | POA: Diagnosis not present

## 2019-10-27 DIAGNOSIS — H524 Presbyopia: Secondary | ICD-10-CM | POA: Diagnosis not present

## 2019-10-27 DIAGNOSIS — H5213 Myopia, bilateral: Secondary | ICD-10-CM | POA: Diagnosis not present

## 2019-10-27 DIAGNOSIS — H52203 Unspecified astigmatism, bilateral: Secondary | ICD-10-CM | POA: Diagnosis not present

## 2019-10-27 DIAGNOSIS — Z7689 Persons encountering health services in other specified circumstances: Secondary | ICD-10-CM | POA: Diagnosis not present

## 2019-10-27 DIAGNOSIS — H40023 Open angle with borderline findings, high risk, bilateral: Secondary | ICD-10-CM | POA: Diagnosis not present

## 2019-10-27 DIAGNOSIS — I10 Essential (primary) hypertension: Secondary | ICD-10-CM | POA: Diagnosis not present

## 2019-10-27 DIAGNOSIS — Z961 Presence of intraocular lens: Secondary | ICD-10-CM | POA: Diagnosis not present

## 2019-10-28 DIAGNOSIS — I10 Essential (primary) hypertension: Secondary | ICD-10-CM | POA: Diagnosis not present

## 2019-10-29 DIAGNOSIS — I10 Essential (primary) hypertension: Secondary | ICD-10-CM | POA: Diagnosis not present

## 2019-10-30 DIAGNOSIS — I10 Essential (primary) hypertension: Secondary | ICD-10-CM | POA: Diagnosis not present

## 2019-10-31 DIAGNOSIS — H5213 Myopia, bilateral: Secondary | ICD-10-CM | POA: Diagnosis not present

## 2019-10-31 DIAGNOSIS — I10 Essential (primary) hypertension: Secondary | ICD-10-CM | POA: Diagnosis not present

## 2019-11-01 DIAGNOSIS — I10 Essential (primary) hypertension: Secondary | ICD-10-CM | POA: Diagnosis not present

## 2019-11-02 DIAGNOSIS — I10 Essential (primary) hypertension: Secondary | ICD-10-CM | POA: Diagnosis not present

## 2019-11-03 DIAGNOSIS — I10 Essential (primary) hypertension: Secondary | ICD-10-CM | POA: Diagnosis not present

## 2019-11-04 DIAGNOSIS — I10 Essential (primary) hypertension: Secondary | ICD-10-CM | POA: Diagnosis not present

## 2019-11-05 DIAGNOSIS — I10 Essential (primary) hypertension: Secondary | ICD-10-CM | POA: Diagnosis not present

## 2019-11-06 DIAGNOSIS — I10 Essential (primary) hypertension: Secondary | ICD-10-CM | POA: Diagnosis not present

## 2019-11-07 DIAGNOSIS — I10 Essential (primary) hypertension: Secondary | ICD-10-CM | POA: Diagnosis not present

## 2019-11-08 DIAGNOSIS — I10 Essential (primary) hypertension: Secondary | ICD-10-CM | POA: Diagnosis not present

## 2019-11-09 DIAGNOSIS — I10 Essential (primary) hypertension: Secondary | ICD-10-CM | POA: Diagnosis not present

## 2019-11-10 DIAGNOSIS — I10 Essential (primary) hypertension: Secondary | ICD-10-CM | POA: Diagnosis not present

## 2019-11-11 DIAGNOSIS — I10 Essential (primary) hypertension: Secondary | ICD-10-CM | POA: Diagnosis not present

## 2019-11-12 DIAGNOSIS — I10 Essential (primary) hypertension: Secondary | ICD-10-CM | POA: Diagnosis not present

## 2019-11-13 DIAGNOSIS — I10 Essential (primary) hypertension: Secondary | ICD-10-CM | POA: Diagnosis not present

## 2019-11-14 ENCOUNTER — Other Ambulatory Visit: Payer: Self-pay | Admitting: Internal Medicine

## 2019-11-14 DIAGNOSIS — I10 Essential (primary) hypertension: Secondary | ICD-10-CM | POA: Diagnosis not present

## 2019-11-14 NOTE — Telephone Encounter (Signed)
Needs refill on eye drops  ;pt contact [phone] [place], [place]

## 2019-11-15 DIAGNOSIS — I10 Essential (primary) hypertension: Secondary | ICD-10-CM | POA: Diagnosis not present

## 2019-11-16 DIAGNOSIS — I10 Essential (primary) hypertension: Secondary | ICD-10-CM | POA: Diagnosis not present

## 2019-11-16 MED ORDER — TRAVATAN Z 0.004 % OP SOLN: 1.0000 [drp] | Freq: Every day | OPHTHALMIC | 1 refills | Status: DC

## 2019-11-17 DIAGNOSIS — I10 Essential (primary) hypertension: Secondary | ICD-10-CM | POA: Diagnosis not present

## 2019-11-18 DIAGNOSIS — I10 Essential (primary) hypertension: Secondary | ICD-10-CM | POA: Diagnosis not present

## 2019-11-19 DIAGNOSIS — I10 Essential (primary) hypertension: Secondary | ICD-10-CM | POA: Diagnosis not present

## 2019-11-20 DIAGNOSIS — I10 Essential (primary) hypertension: Secondary | ICD-10-CM | POA: Diagnosis not present

## 2019-11-21 DIAGNOSIS — I10 Essential (primary) hypertension: Secondary | ICD-10-CM | POA: Diagnosis not present

## 2019-11-21 DIAGNOSIS — N184 Chronic kidney disease, stage 4 (severe): Secondary | ICD-10-CM | POA: Diagnosis not present

## 2019-11-21 DIAGNOSIS — N2581 Secondary hyperparathyroidism of renal origin: Secondary | ICD-10-CM | POA: Diagnosis not present

## 2019-11-21 DIAGNOSIS — D649 Anemia, unspecified: Secondary | ICD-10-CM | POA: Diagnosis not present

## 2019-11-21 DIAGNOSIS — Z7689 Persons encountering health services in other specified circumstances: Secondary | ICD-10-CM | POA: Diagnosis not present

## 2019-11-22 DIAGNOSIS — I10 Essential (primary) hypertension: Secondary | ICD-10-CM | POA: Diagnosis not present

## 2019-11-23 DIAGNOSIS — I10 Essential (primary) hypertension: Secondary | ICD-10-CM | POA: Diagnosis not present

## 2019-11-24 DIAGNOSIS — I10 Essential (primary) hypertension: Secondary | ICD-10-CM | POA: Diagnosis not present

## 2019-11-25 DIAGNOSIS — I10 Essential (primary) hypertension: Secondary | ICD-10-CM | POA: Diagnosis not present

## 2019-11-26 DIAGNOSIS — I10 Essential (primary) hypertension: Secondary | ICD-10-CM | POA: Diagnosis not present

## 2019-11-27 DIAGNOSIS — I10 Essential (primary) hypertension: Secondary | ICD-10-CM | POA: Diagnosis not present

## 2019-11-28 DIAGNOSIS — I10 Essential (primary) hypertension: Secondary | ICD-10-CM | POA: Diagnosis not present

## 2019-11-29 DIAGNOSIS — I10 Essential (primary) hypertension: Secondary | ICD-10-CM | POA: Diagnosis not present

## 2019-11-30 DIAGNOSIS — I10 Essential (primary) hypertension: Secondary | ICD-10-CM | POA: Diagnosis not present

## 2019-12-01 ENCOUNTER — Ambulatory Visit: Payer: Medicaid Other | Admitting: Internal Medicine

## 2019-12-01 ENCOUNTER — Encounter: Payer: Self-pay | Admitting: Internal Medicine

## 2019-12-01 ENCOUNTER — Ambulatory Visit (INDEPENDENT_AMBULATORY_CARE_PROVIDER_SITE_OTHER): Payer: Medicaid Other | Admitting: *Deleted

## 2019-12-01 ENCOUNTER — Other Ambulatory Visit: Payer: Self-pay

## 2019-12-01 ENCOUNTER — Telehealth: Payer: Self-pay | Admitting: Emergency Medicine

## 2019-12-01 VITALS — BP 174/61 | HR 64 | Temp 98.2°F | Ht 61.0 in | Wt 120.7 lb

## 2019-12-01 DIAGNOSIS — I10 Essential (primary) hypertension: Secondary | ICD-10-CM | POA: Diagnosis not present

## 2019-12-01 DIAGNOSIS — I129 Hypertensive chronic kidney disease with stage 1 through stage 4 chronic kidney disease, or unspecified chronic kidney disease: Secondary | ICD-10-CM | POA: Diagnosis not present

## 2019-12-01 DIAGNOSIS — Z950 Presence of cardiac pacemaker: Secondary | ICD-10-CM | POA: Diagnosis not present

## 2019-12-01 DIAGNOSIS — N184 Chronic kidney disease, stage 4 (severe): Secondary | ICD-10-CM

## 2019-12-01 DIAGNOSIS — I25118 Atherosclerotic heart disease of native coronary artery with other forms of angina pectoris: Secondary | ICD-10-CM

## 2019-12-01 DIAGNOSIS — Z79899 Other long term (current) drug therapy: Secondary | ICD-10-CM | POA: Diagnosis not present

## 2019-12-01 DIAGNOSIS — I739 Peripheral vascular disease, unspecified: Secondary | ICD-10-CM

## 2019-12-01 DIAGNOSIS — Z720 Tobacco use: Secondary | ICD-10-CM

## 2019-12-01 DIAGNOSIS — Z7689 Persons encountering health services in other specified circumstances: Secondary | ICD-10-CM | POA: Diagnosis not present

## 2019-12-01 LAB — CUP PACEART REMOTE DEVICE CHECK
Battery Remaining Longevity: 114 mo
Battery Remaining Percentage: 95.5 %
Battery Voltage: 2.99 V
Brady Statistic AP VP Percent: 92 %
Brady Statistic AP VS Percent: 1 %
Brady Statistic AS VP Percent: 7.8 %
Brady Statistic AS VS Percent: 1 %
Brady Statistic RA Percent Paced: 92 %
Brady Statistic RV Percent Paced: 99 %
Date Time Interrogation Session: 20210304020023
Implantable Lead Implant Date: 20110225
Implantable Lead Implant Date: 20110225
Implantable Lead Location: 753859
Implantable Lead Location: 753860
Implantable Pulse Generator Implant Date: 20200306
Lead Channel Impedance Value: 360 Ohm
Lead Channel Impedance Value: 400 Ohm
Lead Channel Pacing Threshold Amplitude: 0.5 V
Lead Channel Pacing Threshold Amplitude: 0.625 V
Lead Channel Pacing Threshold Pulse Width: 0.4 ms
Lead Channel Pacing Threshold Pulse Width: 0.4 ms
Lead Channel Sensing Intrinsic Amplitude: 2.4 mV
Lead Channel Setting Pacing Amplitude: 0.875
Lead Channel Setting Pacing Amplitude: 1.5 V
Lead Channel Setting Pacing Pulse Width: 0.4 ms
Lead Channel Setting Sensing Sensitivity: 4 mV
Pulse Gen Model: 2272
Pulse Gen Serial Number: 9114794

## 2019-12-01 NOTE — Telephone Encounter (Signed)
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 No prior hx of AF/AFL. Dr Lovena Le consulted , CHADSVASC score is 6. Dr Lovena Le recommended patient be started on Eliquis. Patient to be contacted and informed of recommendations by Dr Lovena Le and given the option to start Eliquis or to be scheduled for appointment with Dr Lovena Le to discuss [facility].   Patient contcated but asked if she could call back due to the fact that she was at another Dr appointment.

## 2019-12-01 NOTE — Patient Instructions (Addendum)
Thank you, Ms.Neily Gabler for allowing [place] to provide your care today. Today we discussed chronic conditions .    I have ordered no labs for you. I will get the labs from your nephrologist   I have place a referrals to none   I have ordered the following tests: none   I have ordered the following medication/changed the following medications: none   Please follow-up in 1 month.    Should you have any questions or concerns please call the internal medicine clinic at [phone].    [name], D.O. [facility]

## 2019-12-01 NOTE — Progress Notes (Signed)
PPM Remote  
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
## 2019-12-02 DIAGNOSIS — I10 Essential (primary) hypertension: Secondary | ICD-10-CM | POA: Diagnosis not present

## 2019-12-03 DIAGNOSIS — I10 Essential (primary) hypertension: Secondary | ICD-10-CM | POA: Diagnosis not present

## 2019-12-04 DIAGNOSIS — I10 Essential (primary) hypertension: Secondary | ICD-10-CM | POA: Diagnosis not present

## 2019-12-05 DIAGNOSIS — I10 Essential (primary) hypertension: Secondary | ICD-10-CM | POA: Diagnosis not present

## 2019-12-06 DIAGNOSIS — I10 Essential (primary) hypertension: Secondary | ICD-10-CM | POA: Diagnosis not present

## 2019-12-07 DIAGNOSIS — I10 Essential (primary) hypertension: Secondary | ICD-10-CM | POA: Diagnosis not present

## 2019-12-07 NOTE — Telephone Encounter (Signed)
Spoke with patient. Explained PPM transmission findings. Pt wishes to schedule an appointment with Dr. Lovena Le on 12/14/19 at 11:15am (needs AM appointment). Pt to arrange transportation. Provided office address. No further questions at this time.

## 2019-12-08 DIAGNOSIS — I10 Essential (primary) hypertension: Secondary | ICD-10-CM | POA: Diagnosis not present

## 2019-12-09 DIAGNOSIS — I10 Essential (primary) hypertension: Secondary | ICD-10-CM | POA: Diagnosis not present

## 2019-12-10 DIAGNOSIS — I10 Essential (primary) hypertension: Secondary | ICD-10-CM | POA: Diagnosis not present

## 2019-12-11 MED ORDER — CARVEDILOL 12.5 MG PO TABS: 12.5000 mg | ORAL_TABLET | Freq: Two times a day (BID) | ORAL | 0 refills | Status: DC

## 2019-12-11 MED ORDER — ATORVASTATIN CALCIUM 40 MG PO TABS: 40.0000 mg | ORAL_TABLET | Freq: Every day | ORAL | 3 refills | Status: DC

## 2019-12-11 MED ORDER — FUROSEMIDE 40 MG PO TABS: 40.0000 mg | ORAL_TABLET | Freq: Every day | ORAL | 5 refills | Status: DC

## 2019-12-11 MED ORDER — VALSARTAN 80 MG PO TABS: 160.0000 mg | ORAL_TABLET | Freq: Every day | ORAL | 11 refills | Status: DC

## 2019-12-11 MED ORDER — PANTOPRAZOLE SODIUM 40 MG PO TBEC: 40.0000 mg | DELAYED_RELEASE_TABLET | Freq: Every day | ORAL | 5 refills | Status: DC

## 2019-12-11 NOTE — Assessment & Plan Note (Addendum)
   05/26/2019 12/01/2019 Most Recent Value 12/13/2014 - 12/12/2019  BP 166/61 (A) 174/61 (A) 174/61 (A) 12/01/2019    Patient continues to have elevated SBP on Losartan 80 mg BID, she is tolerating the medication without side effect. I refilled her antihypertensive medications. She recently had an appointment with her nephrologist who co-managing her HTN. She does have elevated pulse pressure, most recent Echo was in 2016 with an EF of 55%. I could not find any documentation of valvular abnormalities. Given her Aurora she likely has some degree of valvular calcifications/stiffness. We may need her echo in the near future.   Plan: - Continue antiHTN medications - Follow up in 1 month

## 2019-12-12 ENCOUNTER — Encounter: Payer: Self-pay | Admitting: Internal Medicine

## 2019-12-12 DIAGNOSIS — I10 Essential (primary) hypertension: Secondary | ICD-10-CM | POA: Diagnosis not present

## 2019-12-12 NOTE — Progress Notes (Signed)
Internal Medicine Clinic Attending  Case discussed with Dr. Coe at the time of the visit.  We reviewed the resident's history and exam and pertinent patient test results.  I agree with the assessment, diagnosis, and plan of care documented in the resident's note.    

## 2019-12-12 NOTE — Assessment & Plan Note (Signed)
Patient states that she is being follow by [facility] for her CKD. She admits to a recent visit to her Nephrologist in the last couple of months. She recently had blood work done at that time. I will reach out to there office to get her most recent labs. I asked the patient to follow up in one month so we can make decisions from her most recent lab works and perform any additional labs as needed.  Plan: - Get records from [place] up in 1 month.

## 2019-12-13 DIAGNOSIS — I10 Essential (primary) hypertension: Secondary | ICD-10-CM | POA: Diagnosis not present

## 2019-12-14 ENCOUNTER — Telehealth: Payer: Self-pay | Admitting: Internal Medicine

## 2019-12-14 ENCOUNTER — Ambulatory Visit: Payer: Medicaid Other | Admitting: Internal Medicine

## 2019-12-14 ENCOUNTER — Other Ambulatory Visit: Payer: Self-pay

## 2019-12-14 VITALS — BP 134/84 | HR 62 | Ht 61.0 in | Wt 120.0 lb

## 2019-12-14 DIAGNOSIS — I442 Atrioventricular block, complete: Secondary | ICD-10-CM

## 2019-12-14 DIAGNOSIS — I10 Essential (primary) hypertension: Secondary | ICD-10-CM | POA: Diagnosis not present

## 2019-12-14 DIAGNOSIS — I739 Peripheral vascular disease, unspecified: Secondary | ICD-10-CM

## 2019-12-14 DIAGNOSIS — Z950 Presence of cardiac pacemaker: Secondary | ICD-10-CM | POA: Diagnosis not present

## 2019-12-14 DIAGNOSIS — Z7689 Persons encountering health services in other specified circumstances: Secondary | ICD-10-CM | POA: Diagnosis not present

## 2019-12-14 MED ORDER — RIVAROXABAN 15 MG PO TABS: 15.0000 mg | ORAL_TABLET | Freq: Every day | ORAL | 11 refills | Status: DC
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
## 2019-12-14 NOTE — Telephone Encounter (Signed)
New message  Pt c/o medication issue:  1. Name of Medication:Rivaroxaban (XARELTO) 15 MG TABS tablet  2. How are you currently taking this medication (dosage and times per day)?as written  3. Are you having a reaction (difficulty breathing--STAT)?no   4. What is your medication issue? Patient wants to know if she should take aspirin with this medication. Please advise.

## 2019-12-14 NOTE — Patient Instructions (Addendum)
Medication Instructions:  Your physician recommends that you continue on your current medications as directed. Please refer to the Current Medication list given to you today.  STOP taking ASPIRIN   Start Xarelto 15 mg--one tablet by mouth daily with your largest meal.  Labwork: None ordered.  Testing/Procedures: None ordered.  Follow-Up: Your physician wants you to follow-up in: one year with Dr. Lovena Le.   You will receive a reminder letter in the mail two months in advance. If you don't receive a letter, please call our office to schedule the follow-up appointment.  Remote monitoring is used to monitor your Pacemaker from home. This monitoring reduces the number of office visits required to check your device to one time per year. It allows [place] to keep an eye on the functioning of your device to ensure it is working properly. You are scheduled for a device check from home on 03/01/2020. You may send your transmission at any time that day. If you have a wireless device, the transmission will be sent automatically. After your physician reviews your transmission, you will receive a postcard with your next transmission date.  Any Other Special Instructions Will Be Listed Below (If Applicable).  If you need a refill on your cardiac medications before your next appointment, please call your pharmacy.   Rivaroxaban oral tablets What is this medicine? RIVAROXABAN (ri va ROX a ban) is an anticoagulant (blood thinner). It is used to treat blood clots in the lungs or in the veins. It is also used to prevent blood clots in the lungs or in the veins. It is also used to lower the chance of stroke in people with a medical condition called atrial fibrillation. This medicine may be used for other purposes; ask your health care provider or pharmacist if you have questions. COMMON BRAND NAME(S): Xarelto, Xarelto Starter Pack What should I tell my health care provider before I take this medicine? They need to  know if you have any of these conditions:  antiphospholipid antibody syndrome  artificial heart valve  bleeding disorders  bleeding in the brain  blood in your stools (black or tarry stools) or if you have blood in your vomit  history of blood clots  history of stomach bleeding  kidney disease  liver disease  low blood counts, like low white cell, platelet, or red cell counts  recent or planned spinal or epidural procedure  take medicines that treat or prevent blood clots  an unusual or allergic reaction to rivaroxaban, other medicines, foods, dyes, or preservatives  pregnant or trying to get pregnant  breast-feeding How should I use this medicine? Take this medicine by mouth with a glass of water. Follow the directions on the prescription label. Take your medicine at regular intervals. Do not take it more often than directed. Do not stop taking except on your doctor's advice. Stopping this medicine may increase your risk of a blood clot. Be sure to refill your prescription before you run out of medicine. If you are taking this medicine after hip or knee replacement surgery, take it with or without food. If you are taking this medicine for atrial fibrillation, take it with your evening meal. If you are taking this medicine to treat blood clots, take it with food at the same time each day. If you are unable to swallow your tablet, you may crush the tablet and mix it in applesauce. Then, immediately eat the applesauce. You should eat more food right after you eat the applesauce containing  the crushed tablet. Talk to your pediatrician regarding the use of this medicine in children. Special care may be needed. Overdosage: If you think you have taken too much of this medicine contact a poison control center or emergency room at once. NOTE: This medicine is only for you. Do not share this medicine with others. What if I miss a dose? If you take your medicine once a day and miss a  dose, take the missed dose as soon as you remember. If it is almost time for your next dose, take only that dose. Do not take double or extra doses. If you take your medicine twice a day and miss a dose, take the missed dose immediately. In this instance, 2 tablets may be taken at the same time. The next day you should take 1 tablet twice a day as directed. What may interact with this medicine? Do not take this medicine with any of the following medications:  defibrotide This medicine may also interact with the following medications:  aspirin and aspirin-like medicines  certain antibiotics like erythromycin, azithromycin, and clarithromycin  certain medicines for fungal infections like ketoconazole and itraconazole  certain medicines for irregular heart beat like amiodarone, quinidine, dronedarone  certain medicines for seizures like carbamazepine, phenytoin  certain medicines that treat or prevent blood clots like warfarin, enoxaparin, and dalteparin  conivaptan  felodipine  indinavir  lopinavir; ritonavir  NSAIDS, medicines for pain and inflammation, like ibuprofen or naproxen  ranolazine  rifampin  ritonavir  SNRIs, medicines for depression, like desvenlafaxine, duloxetine, levomilnacipran, venlafaxine  SSRIs, medicines for depression, like citalopram, escitalopram, fluoxetine, fluvoxamine, paroxetine, sertraline  St. John's wort  verapamil This list may not describe all possible interactions. Give your health care provider a list of all the medicines, herbs, non-prescription drugs, or dietary supplements you use. Also tell them if you smoke, drink alcohol, or use illegal drugs. Some items may interact with your medicine. What should I watch for while using this medicine? Visit your healthcare professional for regular checks on your progress. You may need blood work done while you are taking this medicine. Your condition will be monitored carefully while you are  receiving this medicine. It is important not to miss any appointments. Avoid sports and activities that might cause injury while you are using this medicine. Severe falls or injuries can cause unseen bleeding. Be careful when using sharp tools or knives. Consider using an Copy. Take special care brushing or flossing your teeth. Report any injuries, bruising, or red spots on the skin to your healthcare professional. If you are going to need surgery or other procedure, tell your healthcare professional that you are taking this medicine. Wear a medical ID bracelet or chain. Carry a card that describes your disease and details of your medicine and dosage times. What side effects may I notice from receiving this medicine? Side effects that you should report to your doctor or health care professional as soon as possible:  allergic reactions like skin rash, itching or hives, swelling of the face, lips, or tongue  back pain  redness, blistering, peeling or loosening of the skin, including inside the mouth  signs and symptoms of bleeding such as bloody or black, tarry stools; red or dark-brown urine; spitting up blood or brown material that looks like coffee grounds; red spots on the skin; unusual bruising or bleeding from the eye, gums, or nose  signs and symptoms of a blood clot such as chest pain; shortness of breath; pain, swelling,  or warmth in the leg  signs and symptoms of a stroke such as changes in vision; confusion; trouble speaking or understanding; severe headaches; sudden numbness or weakness of the face, arm or leg; trouble walking; dizziness; loss of coordination Side effects that usually do not require medical attention (report to your doctor or health care professional if they continue or are bothersome):  dizziness  muscle pain This list may not describe all possible side effects. Call your doctor for medical advice about side effects. You may report side effects to FDA at  1-800-FDA-1088. Where should I keep my medicine? Keep out of the reach of children. Store at room temperature between 15 and 30 degrees C (59 and 86 degrees F). Throw away any unused medicine after the expiration date. NOTE: This sheet is a summary. It may not cover all possible information. If you have questions about this medicine, talk to your doctor, pharmacist, or health care provider.  2020 Elsevier/Gold Standard (2018-12-13 09:45:59)

## 2019-12-15 DIAGNOSIS — I10 Essential (primary) hypertension: Secondary | ICD-10-CM | POA: Diagnosis not present

## 2019-12-15 NOTE — Telephone Encounter (Signed)
Returned call to Pt.  Advised to stop aspirin.  Pt indicates understanding.  It's her bday!

## 2019-12-16 DIAGNOSIS — I10 Essential (primary) hypertension: Secondary | ICD-10-CM | POA: Diagnosis not present

## 2019-12-17 DIAGNOSIS — I10 Essential (primary) hypertension: Secondary | ICD-10-CM | POA: Diagnosis not present

## 2019-12-19 ENCOUNTER — Other Ambulatory Visit: Payer: Self-pay | Admitting: Internal Medicine

## 2019-12-19 DIAGNOSIS — I429 Cardiomyopathy, unspecified: Secondary | ICD-10-CM

## 2019-12-19 DIAGNOSIS — I10 Essential (primary) hypertension: Secondary | ICD-10-CM | POA: Diagnosis not present

## 2019-12-20 DIAGNOSIS — I10 Essential (primary) hypertension: Secondary | ICD-10-CM | POA: Diagnosis not present

## 2019-12-21 DIAGNOSIS — I10 Essential (primary) hypertension: Secondary | ICD-10-CM | POA: Diagnosis not present

## 2019-12-22 DIAGNOSIS — I10 Essential (primary) hypertension: Secondary | ICD-10-CM | POA: Diagnosis not present

## 2019-12-23 DIAGNOSIS — I10 Essential (primary) hypertension: Secondary | ICD-10-CM | POA: Diagnosis not present

## 2019-12-27 DIAGNOSIS — H524 Presbyopia: Secondary | ICD-10-CM | POA: Diagnosis not present

## 2019-12-27 DIAGNOSIS — Z7689 Persons encountering health services in other specified circumstances: Secondary | ICD-10-CM | POA: Diagnosis not present

## 2020-01-01 DIAGNOSIS — I10 Essential (primary) hypertension: Secondary | ICD-10-CM | POA: Diagnosis not present

## 2020-01-02 DIAGNOSIS — I10 Essential (primary) hypertension: Secondary | ICD-10-CM | POA: Diagnosis not present

## 2020-01-03 DIAGNOSIS — I10 Essential (primary) hypertension: Secondary | ICD-10-CM | POA: Diagnosis not present

## 2020-01-04 DIAGNOSIS — Z7689 Persons encountering health services in other specified circumstances: Secondary | ICD-10-CM | POA: Diagnosis not present

## 2020-01-04 DIAGNOSIS — I10 Essential (primary) hypertension: Secondary | ICD-10-CM | POA: Diagnosis not present

## 2020-01-05 DIAGNOSIS — I10 Essential (primary) hypertension: Secondary | ICD-10-CM | POA: Diagnosis not present

## 2020-01-06 ENCOUNTER — Other Ambulatory Visit: Payer: Self-pay | Admitting: Internal Medicine

## 2020-01-06 DIAGNOSIS — I10 Essential (primary) hypertension: Secondary | ICD-10-CM | POA: Diagnosis not present

## 2020-01-07 DIAGNOSIS — I10 Essential (primary) hypertension: Secondary | ICD-10-CM | POA: Diagnosis not present

## 2020-01-08 DIAGNOSIS — I10 Essential (primary) hypertension: Secondary | ICD-10-CM | POA: Diagnosis not present

## 2020-01-09 DIAGNOSIS — I10 Essential (primary) hypertension: Secondary | ICD-10-CM | POA: Diagnosis not present

## 2020-01-10 DIAGNOSIS — I10 Essential (primary) hypertension: Secondary | ICD-10-CM | POA: Diagnosis not present

## 2020-01-11 DIAGNOSIS — I10 Essential (primary) hypertension: Secondary | ICD-10-CM | POA: Diagnosis not present

## 2020-01-12 DIAGNOSIS — I10 Essential (primary) hypertension: Secondary | ICD-10-CM | POA: Diagnosis not present

## 2020-01-13 DIAGNOSIS — I10 Essential (primary) hypertension: Secondary | ICD-10-CM | POA: Diagnosis not present

## 2020-01-14 DIAGNOSIS — I10 Essential (primary) hypertension: Secondary | ICD-10-CM | POA: Diagnosis not present

## 2020-01-15 DIAGNOSIS — I10 Essential (primary) hypertension: Secondary | ICD-10-CM | POA: Diagnosis not present

## 2020-01-16 DIAGNOSIS — I10 Essential (primary) hypertension: Secondary | ICD-10-CM | POA: Diagnosis not present

## 2020-01-17 DIAGNOSIS — I10 Essential (primary) hypertension: Secondary | ICD-10-CM | POA: Diagnosis not present

## 2020-01-18 DIAGNOSIS — I10 Essential (primary) hypertension: Secondary | ICD-10-CM | POA: Diagnosis not present

## 2020-01-19 DIAGNOSIS — I10 Essential (primary) hypertension: Secondary | ICD-10-CM | POA: Diagnosis not present

## 2020-01-20 DIAGNOSIS — I10 Essential (primary) hypertension: Secondary | ICD-10-CM | POA: Diagnosis not present

## 2020-01-21 DIAGNOSIS — I10 Essential (primary) hypertension: Secondary | ICD-10-CM | POA: Diagnosis not present

## 2020-01-22 DIAGNOSIS — I10 Essential (primary) hypertension: Secondary | ICD-10-CM | POA: Diagnosis not present

## 2020-01-23 DIAGNOSIS — I10 Essential (primary) hypertension: Secondary | ICD-10-CM | POA: Diagnosis not present

## 2020-01-24 DIAGNOSIS — I10 Essential (primary) hypertension: Secondary | ICD-10-CM | POA: Diagnosis not present

## 2020-01-25 ENCOUNTER — Ambulatory Visit: Payer: Medicaid Other | Admitting: Podiatry

## 2020-01-25 ENCOUNTER — Encounter: Payer: Self-pay | Admitting: Podiatry

## 2020-01-25 ENCOUNTER — Other Ambulatory Visit: Payer: Self-pay

## 2020-01-25 VITALS — Temp 97.6°F

## 2020-01-25 DIAGNOSIS — Q828 Other specified congenital malformations of skin: Secondary | ICD-10-CM

## 2020-01-25 DIAGNOSIS — M79676 Pain in unspecified toe(s): Secondary | ICD-10-CM

## 2020-01-25 DIAGNOSIS — B351 Tinea unguium: Secondary | ICD-10-CM | POA: Diagnosis not present

## 2020-01-25 DIAGNOSIS — Z7689 Persons encountering health services in other specified circumstances: Secondary | ICD-10-CM | POA: Diagnosis not present

## 2020-01-25 DIAGNOSIS — E1151 Type 2 diabetes mellitus with diabetic peripheral angiopathy without gangrene: Secondary | ICD-10-CM

## 2020-01-25 DIAGNOSIS — I10 Essential (primary) hypertension: Secondary | ICD-10-CM | POA: Diagnosis not present

## 2020-01-25 DIAGNOSIS — Z89512 Acquired absence of left leg below knee: Secondary | ICD-10-CM

## 2020-01-25 NOTE — Progress Notes (Signed)
This patient returns to my office for at risk foot care.  This patient requires this care by a professional since this patient will be at risk due to having diabetes and pvd with BK left leg.  This patient is unable to cut nails herself since the patient cannot reach her nails.These nails are painful walking and wearing shoes.  This patient presents for at risk foot care today.  General Appearance  Alert, conversant and in no acute stress.  Vascular  Dorsalis pedis and posterior tibial  pulses are absent right foot.  Capillary return is within normal limits . Cold feet right .  Neurologic  Senn-Weinstein monofilament wire test diminished   bilaterally. Muscle power within normal limits bilaterally.  Nails Thick disfigured discolored nails with subungual debris  from hallux to fifth toes right.. No evidence of bacterial infection or drainage bilaterally.  Orthopedic  No limitations of motion  feet .  No crepitus or effusions noted.  No bony pathology or digital deformities noted.  Skin  normotropic skin right foot.  Porokeratosis sub 5 right foot..  No signs of infections or ulcers noted.     Onychomycosis  Pain in right toes  Pain in left toes.  Callus on tip of right hallux.  Consent was obtained for treatment procedures.   Mechanical debridement of nails 1-5  Right  performed with a nail nipper.  Filed with dremel without incident.   Debride nails with # 15 blade.   Return office visit   3 months                   Told patient to return for periodic foot care and evaluation due to potential at risk complications.   Gardiner Barefoot DPM

## 2020-01-26 DIAGNOSIS — I10 Essential (primary) hypertension: Secondary | ICD-10-CM | POA: Diagnosis not present

## 2020-01-27 DIAGNOSIS — I10 Essential (primary) hypertension: Secondary | ICD-10-CM | POA: Diagnosis not present

## 2020-01-28 DIAGNOSIS — I10 Essential (primary) hypertension: Secondary | ICD-10-CM | POA: Diagnosis not present

## 2020-01-29 DIAGNOSIS — I10 Essential (primary) hypertension: Secondary | ICD-10-CM | POA: Diagnosis not present

## 2020-01-30 DIAGNOSIS — I10 Essential (primary) hypertension: Secondary | ICD-10-CM | POA: Diagnosis not present

## 2020-01-31 DIAGNOSIS — I10 Essential (primary) hypertension: Secondary | ICD-10-CM | POA: Diagnosis not present

## 2020-02-01 DIAGNOSIS — I10 Essential (primary) hypertension: Secondary | ICD-10-CM | POA: Diagnosis not present

## 2020-02-02 DIAGNOSIS — I10 Essential (primary) hypertension: Secondary | ICD-10-CM | POA: Diagnosis not present

## 2020-02-03 DIAGNOSIS — I10 Essential (primary) hypertension: Secondary | ICD-10-CM | POA: Diagnosis not present

## 2020-02-04 DIAGNOSIS — I10 Essential (primary) hypertension: Secondary | ICD-10-CM | POA: Diagnosis not present

## 2020-02-05 DIAGNOSIS — I10 Essential (primary) hypertension: Secondary | ICD-10-CM | POA: Diagnosis not present

## 2020-02-06 DIAGNOSIS — I10 Essential (primary) hypertension: Secondary | ICD-10-CM | POA: Diagnosis not present

## 2020-02-07 DIAGNOSIS — I10 Essential (primary) hypertension: Secondary | ICD-10-CM | POA: Diagnosis not present

## 2020-02-08 DIAGNOSIS — I10 Essential (primary) hypertension: Secondary | ICD-10-CM | POA: Diagnosis not present

## 2020-02-09 ENCOUNTER — Emergency Department (HOSPITAL_COMMUNITY): Payer: Medicaid Other

## 2020-02-09 ENCOUNTER — Encounter: Payer: Medicaid Other | Admitting: Internal Medicine

## 2020-02-09 ENCOUNTER — Other Ambulatory Visit: Payer: Self-pay

## 2020-02-09 ENCOUNTER — Emergency Department (HOSPITAL_COMMUNITY)
Admission: EM | Admit: 2020-02-09 | Discharge: 2020-02-09 | Disposition: A | Discharge status: Home / Self Care | Payer: Medicaid Other | Attending: Emergency Medicine | Admitting: Emergency Medicine

## 2020-02-09 DIAGNOSIS — Z96642 Presence of left artificial hip joint: Secondary | ICD-10-CM | POA: Insufficient documentation

## 2020-02-09 DIAGNOSIS — N184 Chronic kidney disease, stage 4 (severe): Secondary | ICD-10-CM | POA: Insufficient documentation

## 2020-02-09 DIAGNOSIS — Y92818 Other transport vehicle as the place of occurrence of the external cause: Secondary | ICD-10-CM | POA: Diagnosis not present

## 2020-02-09 DIAGNOSIS — Y999 Unspecified external cause status: Secondary | ICD-10-CM | POA: Diagnosis not present

## 2020-02-09 DIAGNOSIS — Z7689 Persons encountering health services in other specified circumstances: Secondary | ICD-10-CM | POA: Diagnosis not present

## 2020-02-09 DIAGNOSIS — W228XXA Striking against or struck by other objects, initial encounter: Secondary | ICD-10-CM | POA: Diagnosis not present

## 2020-02-09 DIAGNOSIS — I129 Hypertensive chronic kidney disease with stage 1 through stage 4 chronic kidney disease, or unspecified chronic kidney disease: Secondary | ICD-10-CM | POA: Insufficient documentation

## 2020-02-09 DIAGNOSIS — W19XXXA Unspecified fall, initial encounter: Secondary | ICD-10-CM | POA: Diagnosis not present

## 2020-02-09 DIAGNOSIS — F17210 Nicotine dependence, cigarettes, uncomplicated: Secondary | ICD-10-CM | POA: Insufficient documentation

## 2020-02-09 DIAGNOSIS — E1122 Type 2 diabetes mellitus with diabetic chronic kidney disease: Secondary | ICD-10-CM | POA: Diagnosis not present

## 2020-02-09 DIAGNOSIS — S81811A Laceration without foreign body, right lower leg, initial encounter: Secondary | ICD-10-CM

## 2020-02-09 DIAGNOSIS — Y93I9 Activity, other involving external motion: Secondary | ICD-10-CM | POA: Diagnosis not present

## 2020-02-09 DIAGNOSIS — R0902 Hypoxemia: Secondary | ICD-10-CM | POA: Diagnosis not present

## 2020-02-09 DIAGNOSIS — R58 Hemorrhage, not elsewhere classified: Secondary | ICD-10-CM | POA: Diagnosis not present

## 2020-02-09 DIAGNOSIS — Z79899 Other long term (current) drug therapy: Secondary | ICD-10-CM | POA: Diagnosis not present

## 2020-02-09 DIAGNOSIS — I10 Essential (primary) hypertension: Secondary | ICD-10-CM | POA: Diagnosis not present

## 2020-02-09 DIAGNOSIS — Z7984 Long term (current) use of oral hypoglycemic drugs: Secondary | ICD-10-CM | POA: Insufficient documentation

## 2020-02-09 DIAGNOSIS — Z23 Encounter for immunization: Secondary | ICD-10-CM | POA: Diagnosis not present

## 2020-02-09 DIAGNOSIS — I2510 Atherosclerotic heart disease of native coronary artery without angina pectoris: Secondary | ICD-10-CM | POA: Insufficient documentation

## 2020-02-09 MED ORDER — LIDOCAINE-EPINEPHRINE (PF) 2 %-1:200000 IJ SOLN
10.0000 mL | Freq: Once | INTRAMUSCULAR | Status: AC
  Administered 2020-02-09: 10 mL via INTRADERMAL
  Filled 2020-02-09: qty 20

## 2020-02-09 MED ORDER — TETANUS-DIPHTH-ACELL PERTUSSIS 5-2.5-18.5 LF-MCG/0.5 IM SUSP
0.5000 mL | Freq: Once | INTRAMUSCULAR | Status: AC
  Administered 2020-02-09: 0.5 mL via INTRAMUSCULAR
  Filled 2020-02-09: qty 0.5
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
## 2020-02-09 NOTE — ED Notes (Signed)
Patient verbalizes understanding of discharge instructions. Opportunity for questioning and answers were provided. Armband removed by staff, pt discharged from ED to home via transportation

## 2020-02-10 DIAGNOSIS — I10 Essential (primary) hypertension: Secondary | ICD-10-CM | POA: Diagnosis not present

## 2020-02-11 DIAGNOSIS — I10 Essential (primary) hypertension: Secondary | ICD-10-CM | POA: Diagnosis not present

## 2020-02-12 DIAGNOSIS — I10 Essential (primary) hypertension: Secondary | ICD-10-CM | POA: Diagnosis not present

## 2020-02-13 DIAGNOSIS — I10 Essential (primary) hypertension: Secondary | ICD-10-CM | POA: Diagnosis not present

## 2020-02-14 DIAGNOSIS — I10 Essential (primary) hypertension: Secondary | ICD-10-CM | POA: Diagnosis not present

## 2020-02-15 DIAGNOSIS — I10 Essential (primary) hypertension: Secondary | ICD-10-CM | POA: Diagnosis not present

## 2020-02-16 DIAGNOSIS — I10 Essential (primary) hypertension: Secondary | ICD-10-CM | POA: Diagnosis not present

## 2020-02-17 DIAGNOSIS — I10 Essential (primary) hypertension: Secondary | ICD-10-CM | POA: Diagnosis not present

## 2020-02-18 DIAGNOSIS — I10 Essential (primary) hypertension: Secondary | ICD-10-CM | POA: Diagnosis not present

## 2020-02-19 DIAGNOSIS — I10 Essential (primary) hypertension: Secondary | ICD-10-CM | POA: Diagnosis not present

## 2020-02-20 DIAGNOSIS — I10 Essential (primary) hypertension: Secondary | ICD-10-CM | POA: Diagnosis not present

## 2020-02-21 DIAGNOSIS — I10 Essential (primary) hypertension: Secondary | ICD-10-CM | POA: Diagnosis not present

## 2020-02-22 DIAGNOSIS — S81819A Laceration without foreign body, unspecified lower leg, initial encounter: Secondary | ICD-10-CM | POA: Diagnosis not present

## 2020-02-22 DIAGNOSIS — Z950 Presence of cardiac pacemaker: Secondary | ICD-10-CM | POA: Diagnosis not present

## 2020-02-22 DIAGNOSIS — D649 Anemia, unspecified: Secondary | ICD-10-CM | POA: Diagnosis not present

## 2020-02-22 DIAGNOSIS — N184 Chronic kidney disease, stage 4 (severe): Secondary | ICD-10-CM | POA: Diagnosis not present

## 2020-02-22 DIAGNOSIS — I428 Other cardiomyopathies: Secondary | ICD-10-CM | POA: Diagnosis not present

## 2020-02-22 DIAGNOSIS — E119 Type 2 diabetes mellitus without complications: Secondary | ICD-10-CM | POA: Diagnosis not present

## 2020-02-22 DIAGNOSIS — N2581 Secondary hyperparathyroidism of renal origin: Secondary | ICD-10-CM | POA: Diagnosis not present

## 2020-02-22 DIAGNOSIS — I10 Essential (primary) hypertension: Secondary | ICD-10-CM | POA: Diagnosis not present

## 2020-02-23 DIAGNOSIS — I10 Essential (primary) hypertension: Secondary | ICD-10-CM | POA: Diagnosis not present

## 2020-02-24 DIAGNOSIS — I10 Essential (primary) hypertension: Secondary | ICD-10-CM | POA: Diagnosis not present

## 2020-02-25 DIAGNOSIS — I10 Essential (primary) hypertension: Secondary | ICD-10-CM | POA: Diagnosis not present

## 2020-02-26 DIAGNOSIS — I10 Essential (primary) hypertension: Secondary | ICD-10-CM | POA: Diagnosis not present

## 2020-02-27 DIAGNOSIS — I10 Essential (primary) hypertension: Secondary | ICD-10-CM | POA: Diagnosis not present

## 2020-02-28 ENCOUNTER — Telehealth: Payer: Self-pay | Admitting: Internal Medicine

## 2020-02-28 DIAGNOSIS — I10 Essential (primary) hypertension: Secondary | ICD-10-CM | POA: Diagnosis not present

## 2020-02-28 NOTE — Telephone Encounter (Signed)
Pls contact pt [phone] regarding medicine

## 2020-02-28 NOTE — Telephone Encounter (Signed)
Pt calls and states she needs the vit D sent as a script to [facility], she states someone called her this am stating they were a nurse and she needed to take this, she called her pharm and stated the md would need to send a script in

## 2020-02-29 DIAGNOSIS — I10 Essential (primary) hypertension: Secondary | ICD-10-CM | POA: Diagnosis not present

## 2020-03-01 ENCOUNTER — Ambulatory Visit (INDEPENDENT_AMBULATORY_CARE_PROVIDER_SITE_OTHER): Payer: Medicaid Other | Admitting: *Deleted

## 2020-03-01 ENCOUNTER — Other Ambulatory Visit: Payer: Self-pay | Admitting: Internal Medicine

## 2020-03-01 DIAGNOSIS — I495 Sick sinus syndrome: Secondary | ICD-10-CM | POA: Diagnosis not present

## 2020-03-01 DIAGNOSIS — Z7689 Persons encountering health services in other specified circumstances: Secondary | ICD-10-CM | POA: Diagnosis not present

## 2020-03-01 DIAGNOSIS — I10 Essential (primary) hypertension: Secondary | ICD-10-CM | POA: Diagnosis not present

## 2020-03-01 LAB — CUP PACEART REMOTE DEVICE CHECK
Battery Remaining Longevity: 119 mo
Battery Remaining Percentage: 95.5 %
Battery Voltage: 2.99 V
Brady Statistic AP VP Percent: 85 %
Brady Statistic AP VS Percent: 1 %
Brady Statistic AS VP Percent: 15 %
Brady Statistic AS VS Percent: 1 %
Brady Statistic RA Percent Paced: 84 %
Brady Statistic RV Percent Paced: 99 %
Date Time Interrogation Session: 20210603043047
Implantable Lead Implant Date: 20110225
Implantable Lead Implant Date: 20110225
Implantable Lead Location: 753859
Implantable Lead Location: 753860
Implantable Pulse Generator Implant Date: 20200306
Lead Channel Impedance Value: 350 Ohm
Lead Channel Impedance Value: 400 Ohm
Lead Channel Pacing Threshold Amplitude: 0.5 V
Lead Channel Pacing Threshold Amplitude: 0.875 V
Lead Channel Pacing Threshold Pulse Width: 0.4 ms
Lead Channel Pacing Threshold Pulse Width: 0.4 ms
Lead Channel Sensing Intrinsic Amplitude: 2.4 mV
Lead Channel Setting Pacing Amplitude: 1.125
Lead Channel Setting Pacing Amplitude: 1.5 V
Lead Channel Setting Pacing Pulse Width: 0.4 ms
Lead Channel Setting Sensing Sensitivity: 4 mV
Pulse Gen Model: 2272
Pulse Gen Serial Number: 9114794

## 2020-03-02 DIAGNOSIS — I10 Essential (primary) hypertension: Secondary | ICD-10-CM | POA: Diagnosis not present

## 2020-03-03 DIAGNOSIS — I10 Essential (primary) hypertension: Secondary | ICD-10-CM | POA: Diagnosis not present

## 2020-03-04 DIAGNOSIS — I10 Essential (primary) hypertension: Secondary | ICD-10-CM | POA: Diagnosis not present

## 2020-03-05 DIAGNOSIS — I10 Essential (primary) hypertension: Secondary | ICD-10-CM | POA: Diagnosis not present

## 2020-03-06 DIAGNOSIS — I10 Essential (primary) hypertension: Secondary | ICD-10-CM | POA: Diagnosis not present

## 2020-03-06 NOTE — Progress Notes (Signed)
Remote pacemaker transmission.   

## 2020-03-07 DIAGNOSIS — I10 Essential (primary) hypertension: Secondary | ICD-10-CM | POA: Diagnosis not present

## 2020-03-08 ENCOUNTER — Telehealth: Payer: Self-pay | Admitting: Internal Medicine

## 2020-03-08 DIAGNOSIS — I10 Essential (primary) hypertension: Secondary | ICD-10-CM | POA: Diagnosis not present

## 2020-03-08 NOTE — Telephone Encounter (Signed)
Pt calls and states she had sutures placed in leg 5/13 and was not told when to return for removal. States she needs 5 days notice for transportation, [facility] 6/15 at 0845.
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
## 2020-03-09 DIAGNOSIS — I10 Essential (primary) hypertension: Secondary | ICD-10-CM | POA: Diagnosis not present

## 2020-03-10 DIAGNOSIS — I10 Essential (primary) hypertension: Secondary | ICD-10-CM | POA: Diagnosis not present

## 2020-03-11 DIAGNOSIS — I10 Essential (primary) hypertension: Secondary | ICD-10-CM | POA: Diagnosis not present

## 2020-03-12 ENCOUNTER — Other Ambulatory Visit: Payer: Self-pay | Admitting: Internal Medicine

## 2020-03-12 DIAGNOSIS — I429 Cardiomyopathy, unspecified: Secondary | ICD-10-CM

## 2020-03-12 DIAGNOSIS — I10 Essential (primary) hypertension: Secondary | ICD-10-CM | POA: Diagnosis not present

## 2020-03-13 DIAGNOSIS — I10 Essential (primary) hypertension: Secondary | ICD-10-CM | POA: Diagnosis not present

## 2020-03-14 ENCOUNTER — Other Ambulatory Visit: Payer: Self-pay

## 2020-03-14 ENCOUNTER — Ambulatory Visit: Payer: Medicaid Other | Admitting: Internal Medicine

## 2020-03-14 ENCOUNTER — Encounter: Payer: Self-pay | Admitting: Internal Medicine

## 2020-03-14 VITALS — BP 153/77 | HR 58 | Temp 98.4°F | Ht 61.0 in | Wt 126.2 lb

## 2020-03-14 DIAGNOSIS — Z89512 Acquired absence of left leg below knee: Secondary | ICD-10-CM | POA: Diagnosis not present

## 2020-03-14 DIAGNOSIS — S81811A Laceration without foreign body, right lower leg, initial encounter: Secondary | ICD-10-CM | POA: Insufficient documentation

## 2020-03-14 DIAGNOSIS — S81811S Laceration without foreign body, right lower leg, sequela: Secondary | ICD-10-CM | POA: Diagnosis not present

## 2020-03-14 DIAGNOSIS — Z7689 Persons encountering health services in other specified circumstances: Secondary | ICD-10-CM | POA: Diagnosis not present

## 2020-03-14 DIAGNOSIS — Z89511 Acquired absence of right leg below knee: Secondary | ICD-10-CM | POA: Diagnosis not present

## 2020-03-14 DIAGNOSIS — I10 Essential (primary) hypertension: Secondary | ICD-10-CM | POA: Diagnosis not present

## 2020-03-14 HISTORY — DX: Laceration without foreign body, right lower leg, initial encounter: S81.811A

## 2020-03-14 NOTE — Patient Instructions (Addendum)
Thank you for visiting [place] in clinic today.  Below is a summary of what we discussed:  1.  Suture removals -We remove the sutures in your skin.  If you start having fevers, skin breakdown, redness, or pus drainage this may be a sign of an infection.  Please call us immediately so we can address it.  2. Wheelchair -I placed an order for you to get a new wheelchair  3. Follow up  -Please schedule a follow up appointment with yoru PCP in 1 month  If you have any questions or concerns in the meantime, please feel free to reach out to [place].
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
## 2020-03-14 NOTE — Assessment & Plan Note (Signed)
Pt's wheelchair appears worn down. Pt states that there are parts of the wheelchair that are broken and do not function anymore  Plan: -DME manual wheelchair order placed -F/u in 4 weeks to see PCP
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
## 2020-03-15 ENCOUNTER — Encounter: Payer: Self-pay | Admitting: Orthopedic Surgery

## 2020-03-15 ENCOUNTER — Ambulatory Visit (INDEPENDENT_AMBULATORY_CARE_PROVIDER_SITE_OTHER): Payer: Medicaid Other | Admitting: Physician Assistant

## 2020-03-15 VITALS — Ht 61.0 in | Wt 126.2 lb

## 2020-03-15 DIAGNOSIS — I10 Essential (primary) hypertension: Secondary | ICD-10-CM | POA: Diagnosis not present

## 2020-03-15 DIAGNOSIS — Z7689 Persons encountering health services in other specified circumstances: Secondary | ICD-10-CM | POA: Diagnosis not present

## 2020-03-15 DIAGNOSIS — S88112A Complete traumatic amputation at level between knee and ankle, left lower leg, initial encounter: Secondary | ICD-10-CM
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
## 2020-03-15 NOTE — Progress Notes (Signed)
Internal Medicine Clinic Attending  I saw and evaluated the patient.  I personally confirmed the key portions of the history and exam documented by Dr. Sheppard Coil and I reviewed pertinent patient test results.  The assessment, diagnosis, and plan were formulated together and I agree with the documentation in the residents note.

## 2020-03-16 DIAGNOSIS — I10 Essential (primary) hypertension: Secondary | ICD-10-CM | POA: Diagnosis not present

## 2020-03-17 DIAGNOSIS — I10 Essential (primary) hypertension: Secondary | ICD-10-CM | POA: Diagnosis not present

## 2020-03-18 DIAGNOSIS — I10 Essential (primary) hypertension: Secondary | ICD-10-CM | POA: Diagnosis not present

## 2020-03-19 DIAGNOSIS — I10 Essential (primary) hypertension: Secondary | ICD-10-CM | POA: Diagnosis not present

## 2020-03-20 DIAGNOSIS — I10 Essential (primary) hypertension: Secondary | ICD-10-CM | POA: Diagnosis not present

## 2020-03-21 DIAGNOSIS — I10 Essential (primary) hypertension: Secondary | ICD-10-CM | POA: Diagnosis not present

## 2020-03-21 NOTE — Telephone Encounter (Signed)
Called patient in regards to her Vitamin D medications, as this is not something that I have been prescribing for her nor have I worked her up for Vitamin D deficiency in the recent past. Patient states that it has been some time since she has been on this supplement. I informed her that this is a medication that she can get over the counter at any drugs store for a reasonable price. I also believe that she will likely need to have her vitamin D level rechecked at her next office visit.

## 2020-03-22 DIAGNOSIS — I10 Essential (primary) hypertension: Secondary | ICD-10-CM | POA: Diagnosis not present

## 2020-03-23 DIAGNOSIS — I10 Essential (primary) hypertension: Secondary | ICD-10-CM | POA: Diagnosis not present

## 2020-03-24 DIAGNOSIS — I10 Essential (primary) hypertension: Secondary | ICD-10-CM | POA: Diagnosis not present

## 2020-03-26 DIAGNOSIS — I10 Essential (primary) hypertension: Secondary | ICD-10-CM | POA: Diagnosis not present

## 2020-03-27 DIAGNOSIS — I10 Essential (primary) hypertension: Secondary | ICD-10-CM | POA: Diagnosis not present

## 2020-03-28 DIAGNOSIS — I10 Essential (primary) hypertension: Secondary | ICD-10-CM | POA: Diagnosis not present

## 2020-03-29 DIAGNOSIS — I10 Essential (primary) hypertension: Secondary | ICD-10-CM | POA: Diagnosis not present

## 2020-03-30 DIAGNOSIS — I10 Essential (primary) hypertension: Secondary | ICD-10-CM | POA: Diagnosis not present

## 2020-04-03 ENCOUNTER — Telehealth: Payer: Self-pay

## 2020-04-03 ENCOUNTER — Telehealth: Payer: Self-pay | Admitting: Internal Medicine

## 2020-04-03 NOTE — Telephone Encounter (Signed)
Pt contact pt regarding wheelchair [phone]

## 2020-04-03 NOTE — Telephone Encounter (Signed)
Patient called in today requesting the status of her wheel chair, looked in system order was placed and has been sent by community message to [facility] [place], [place] C7/6/20211:15 PM

## 2020-04-10 ENCOUNTER — Telehealth: Payer: Self-pay | Admitting: Internal Medicine

## 2020-04-10 NOTE — Telephone Encounter (Signed)
Pls contact pt [phone] regarding medicine

## 2020-04-10 NOTE — Telephone Encounter (Signed)
Pt states she needs ph# of ortho she saw, gave her mary anne persons PA ph#. She will call back for concerns

## 2020-04-11 NOTE — Telephone Encounter (Signed)
This has been addressed in separate phone encounter.

## 2020-04-16 ENCOUNTER — Other Ambulatory Visit: Payer: Self-pay | Admitting: Internal Medicine

## 2020-04-17 ENCOUNTER — Ambulatory Visit: Payer: Medicaid Other | Admitting: Physician Assistant

## 2020-04-18 ENCOUNTER — Encounter: Payer: Self-pay | Admitting: Physician Assistant

## 2020-04-18 ENCOUNTER — Ambulatory Visit (INDEPENDENT_AMBULATORY_CARE_PROVIDER_SITE_OTHER): Payer: Medicaid Other | Admitting: Physician Assistant

## 2020-04-18 ENCOUNTER — Other Ambulatory Visit: Payer: Self-pay

## 2020-04-18 VITALS — Ht 61.0 in | Wt 126.0 lb

## 2020-04-18 DIAGNOSIS — Z89512 Acquired absence of left leg below knee: Secondary | ICD-10-CM

## 2020-04-18 DIAGNOSIS — S88112A Complete traumatic amputation at level between knee and ankle, left lower leg, initial encounter: Secondary | ICD-10-CM
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
## 2020-04-27 ENCOUNTER — Other Ambulatory Visit: Payer: Self-pay

## 2020-04-27 ENCOUNTER — Ambulatory Visit (INDEPENDENT_AMBULATORY_CARE_PROVIDER_SITE_OTHER): Payer: Medicaid Other | Admitting: Podiatry

## 2020-04-27 ENCOUNTER — Encounter: Payer: Self-pay | Admitting: Podiatry

## 2020-04-27 DIAGNOSIS — B351 Tinea unguium: Secondary | ICD-10-CM | POA: Diagnosis not present

## 2020-04-27 DIAGNOSIS — Z89512 Acquired absence of left leg below knee: Secondary | ICD-10-CM

## 2020-04-27 DIAGNOSIS — M79676 Pain in unspecified toe(s): Secondary | ICD-10-CM

## 2020-04-27 DIAGNOSIS — Q828 Other specified congenital malformations of skin: Secondary | ICD-10-CM

## 2020-04-27 DIAGNOSIS — E1151 Type 2 diabetes mellitus with diabetic peripheral angiopathy without gangrene: Secondary | ICD-10-CM

## 2020-04-27 NOTE — Progress Notes (Signed)
This patient returns to my office for at risk foot care.  This patient requires this care by a professional since this patient will be at risk due to having diabetes and pvd with BK left leg.  This patient is unable to cut nails herself since the patient cannot reach her nails.These nails are painful walking and wearing shoes.  She has painful callus on the outside bottom of her right foot.  This patient presents for at risk foot care today.  General Appearance  Alert, conversant and in no acute stress.  Vascular  Dorsalis pedis and posterior tibial  pulses are absent right foot.  Capillary return is within normal limits . Cold feet right .  Neurologic  Senn-Weinstein monofilament wire test diminished   bilaterally. Muscle power within normal limits bilaterally.  Nails Thick disfigured discolored nails with subungual debris  from hallux to fifth toes right.. No evidence of bacterial infection or drainage bilaterally.  Orthopedic  No limitations of motion  feet .  No crepitus or effusions noted.  No bony pathology or digital deformities noted.  Skin  normotropic skin right foot.  Porokeratosis sub 5 right foot..  No signs of infections or ulcers noted.     Onychomycosis  Pain in right toes  Pain in left toes.  Callus on tip of right hallux.  Consent was obtained for treatment procedures.   Mechanical debridement of nails 1-5  Right  performed with a nail nipper.  Filed with dremel without incident.   Debride callus with # 15 blade.   Return office visit   3 months                   Told patient to return for periodic foot care and evaluation due to potential at risk complications.   Gardiner Barefoot DPM

## 2020-04-30 LAB — CUP PACEART INCLINIC DEVICE CHECK
Brady Statistic RA Percent Paced: 91 %
Brady Statistic RV Percent Paced: 99 %
Date Time Interrogation Session: 20210317093432
Implantable Lead Implant Date: 20110225
Implantable Lead Implant Date: 20110225
Implantable Lead Location: 753859
Implantable Lead Location: 753860
Implantable Pulse Generator Implant Date: 20200306
Lead Channel Pacing Threshold Amplitude: 0.5 V
Lead Channel Pacing Threshold Amplitude: 0.75 V
Lead Channel Pacing Threshold Pulse Width: 0.4 ms
Lead Channel Pacing Threshold Pulse Width: 0.4 ms
Lead Channel Sensing Intrinsic Amplitude: 2.5 mV
Pulse Gen Model: 2272
Pulse Gen Serial Number: 9114794

## 2020-05-03 ENCOUNTER — Other Ambulatory Visit: Payer: Self-pay | Admitting: Internal Medicine

## 2020-05-03 DIAGNOSIS — I10 Essential (primary) hypertension: Secondary | ICD-10-CM

## 2020-05-08 ENCOUNTER — Other Ambulatory Visit: Payer: Self-pay | Admitting: Family

## 2020-05-08 DIAGNOSIS — Z89512 Acquired absence of left leg below knee: Secondary | ICD-10-CM | POA: Diagnosis not present

## 2020-05-08 DIAGNOSIS — S88112A Complete traumatic amputation at level between knee and ankle, left lower leg, initial encounter: Secondary | ICD-10-CM

## 2020-05-08 NOTE — Progress Notes (Signed)
rf

## 2020-05-10 DIAGNOSIS — N184 Chronic kidney disease, stage 4 (severe): Secondary | ICD-10-CM | POA: Diagnosis not present

## 2020-05-10 DIAGNOSIS — I2510 Atherosclerotic heart disease of native coronary artery without angina pectoris: Secondary | ICD-10-CM | POA: Diagnosis not present

## 2020-05-10 DIAGNOSIS — E872 Acidosis: Secondary | ICD-10-CM | POA: Diagnosis not present

## 2020-05-10 DIAGNOSIS — D649 Anemia, unspecified: Secondary | ICD-10-CM | POA: Diagnosis not present

## 2020-05-10 DIAGNOSIS — I10 Essential (primary) hypertension: Secondary | ICD-10-CM | POA: Diagnosis not present

## 2020-05-10 DIAGNOSIS — N2581 Secondary hyperparathyroidism of renal origin: Secondary | ICD-10-CM | POA: Diagnosis not present

## 2020-05-22 NOTE — Telephone Encounter (Signed)
Patient received wheelchair [place], [place] C8/24/20212:17 PM

## 2020-05-31 ENCOUNTER — Ambulatory Visit (INDEPENDENT_AMBULATORY_CARE_PROVIDER_SITE_OTHER): Payer: Medicaid Other | Admitting: *Deleted

## 2020-05-31 DIAGNOSIS — I495 Sick sinus syndrome: Secondary | ICD-10-CM | POA: Diagnosis not present

## 2020-06-01 LAB — CUP PACEART REMOTE DEVICE CHECK
Battery Remaining Longevity: 117 mo
Battery Remaining Percentage: 95.5 %
Battery Voltage: 2.99 V
Brady Statistic AP VP Percent: 84 %
Brady Statistic AP VS Percent: 1 %
Brady Statistic AS VP Percent: 16 %
Brady Statistic AS VS Percent: 1 %
Brady Statistic RA Percent Paced: 83 %
Brady Statistic RV Percent Paced: 99 %
Date Time Interrogation Session: 20210902020020
Implantable Lead Implant Date: 20110225
Implantable Lead Implant Date: 20110225
Implantable Lead Location: 753859
Implantable Lead Location: 753860
Implantable Pulse Generator Implant Date: 20200306
Lead Channel Impedance Value: 340 Ohm
Lead Channel Impedance Value: 390 Ohm
Lead Channel Pacing Threshold Amplitude: 0.5 V
Lead Channel Pacing Threshold Amplitude: 1 V
Lead Channel Pacing Threshold Pulse Width: 0.4 ms
Lead Channel Pacing Threshold Pulse Width: 0.4 ms
Lead Channel Sensing Intrinsic Amplitude: 2.2 mV
Lead Channel Setting Pacing Amplitude: 1.25 V
Lead Channel Setting Pacing Amplitude: 1.5 V
Lead Channel Setting Pacing Pulse Width: 0.4 ms
Lead Channel Setting Sensing Sensitivity: 4 mV
Pulse Gen Model: 2272
Pulse Gen Serial Number: 9114794

## 2020-06-05 NOTE — Progress Notes (Signed)
Remote pacemaker transmission.   

## 2020-06-08 ENCOUNTER — Telehealth: Payer: Self-pay | Admitting: Internal Medicine

## 2020-06-08 ENCOUNTER — Other Ambulatory Visit: Payer: Self-pay | Admitting: Internal Medicine

## 2020-06-08 DIAGNOSIS — I429 Cardiomyopathy, unspecified: Secondary | ICD-10-CM

## 2020-06-08 MED ORDER — DIPHENOXYLATE-ATROPINE 2.5-0.025 MG/5ML PO LIQD: 5.0000 mL | Freq: Four times a day (QID) | ORAL | 0 refills | Status: DC | PRN

## 2020-06-08 NOTE — Telephone Encounter (Signed)
Pt called and notified liquid lomotil RX was sent to pharmacy.  Instructed patient to take only as directed (51ml by mouth 4 times a day).  Instructed patient if she does not have relief from this RX, or if she experiences confusion or drowsiness, to call [facility] back.  Pt informed this medication does have a narcotic in it and she is not to take more than directed, she verbalized understanding to all instructions. SChaplin, RN,BSN

## 2020-06-08 NOTE — Telephone Encounter (Signed)
Patient would like the liquid medicine that was given for her diarrhea that's the only thing that helped, the tablets doesn't work. Pls contact pt [phone]

## 2020-06-08 NOTE — Telephone Encounter (Signed)
TC patient, she states the imodium A-D tablets aren't working as well as the liquid diarrhea medicine she was given in the past.  States she had 5 BM's yesterday and 4 today.  Pt has hx of chronic diarrhea.  She denies taking any antibiotics recently.  Offerred patient appt, but she states she would rather wait to see if MD would call in liquid medicine.  Upon chart review, lomotil liquid is listed in 2012/2013. Will forward to red team. Laurence Compton, RN,BSN

## 2020-06-15 ENCOUNTER — Telehealth: Payer: Self-pay

## 2020-06-15 ENCOUNTER — Other Ambulatory Visit: Payer: Self-pay | Admitting: Internal Medicine

## 2020-06-15 DIAGNOSIS — Z1231 Encounter for screening mammogram for malignant neoplasm of breast: Secondary | ICD-10-CM

## 2020-06-15 NOTE — Telephone Encounter (Signed)
Acknowledged.  Thank you.

## 2020-06-15 NOTE — Telephone Encounter (Signed)
Received TC from patient who complains of swelling in her stump and RLL which she noted 2 days ago.  She denies any SOB or any other complaints.  Appt offered for Monday, 06/18/20, patient request an appt on Wednesday, 06/20/20 R/T transportation issues.  Appt made 06/20/20 0945, Dr. Sharon Seller. SChaplin, RN,BSN

## 2020-06-20 ENCOUNTER — Encounter: Payer: Self-pay | Admitting: Internal Medicine

## 2020-06-20 ENCOUNTER — Ambulatory Visit: Payer: Medicaid Other | Admitting: Internal Medicine

## 2020-06-20 ENCOUNTER — Other Ambulatory Visit: Payer: Self-pay

## 2020-06-20 VITALS — BP 169/79 | HR 59 | Temp 98.2°F | Ht 61.0 in

## 2020-06-20 DIAGNOSIS — R195 Other fecal abnormalities: Secondary | ICD-10-CM

## 2020-06-20 DIAGNOSIS — K529 Noninfective gastroenteritis and colitis, unspecified: Secondary | ICD-10-CM | POA: Diagnosis not present

## 2020-06-20 DIAGNOSIS — I509 Heart failure, unspecified: Secondary | ICD-10-CM | POA: Insufficient documentation

## 2020-06-20 DIAGNOSIS — R600 Localized edema: Secondary | ICD-10-CM

## 2020-06-20 DIAGNOSIS — R609 Edema, unspecified: Secondary | ICD-10-CM

## 2020-06-20 DIAGNOSIS — L299 Pruritus, unspecified: Secondary | ICD-10-CM | POA: Diagnosis not present

## 2020-06-20 LAB — SEDIMENTATION RATE: Sed Rate: 10 mm/hr (ref 0–22)

## 2020-06-20 LAB — CBC WITH DIFFERENTIAL/PLATELET
Abs Immature Granulocytes: 0.01 10*3/uL (ref 0.00–0.07)
Basophils Absolute: 0 10*3/uL (ref 0.0–0.1)
Basophils Relative: 1 %
Eosinophils Absolute: 0.3 10*3/uL (ref 0.0–0.5)
Eosinophils Relative: 8 %
HCT: 34.3 % — ABNORMAL LOW (ref 36.0–46.0)
Hemoglobin: 11.1 g/dL — ABNORMAL LOW (ref 12.0–15.0)
Immature Granulocytes: 0 %
Lymphocytes Relative: 24 %
Lymphs Abs: 1 10*3/uL (ref 0.7–4.0)
MCH: 31.8 pg (ref 26.0–34.0)
MCHC: 32.4 g/dL (ref 30.0–36.0)
MCV: 98.3 fL (ref 80.0–100.0)
Monocytes Absolute: 0.4 10*3/uL (ref 0.1–1.0)
Monocytes Relative: 10 %
Neutro Abs: 2.3 10*3/uL (ref 1.7–7.7)
Neutrophils Relative %: 57 %
Platelets: 139 10*3/uL — ABNORMAL LOW (ref 150–400)
RBC: 3.49 MIL/uL — ABNORMAL LOW (ref 3.87–5.11)
RDW: 16.6 % — ABNORMAL HIGH (ref 11.5–15.5)
WBC: 4 10*3/uL (ref 4.0–10.5)
nRBC: 0 % (ref 0.0–0.2)

## 2020-06-20 LAB — COMPREHENSIVE METABOLIC PANEL
ALT: 29 U/L (ref 0–44)
AST: 25 U/L (ref 15–41)
Albumin: 3.3 g/dL — ABNORMAL LOW (ref 3.5–5.0)
Alkaline Phosphatase: 83 U/L (ref 38–126)
Anion gap: 11 (ref 5–15)
BUN: 27 mg/dL — ABNORMAL HIGH (ref 8–23)
CO2: 20 mmol/L — ABNORMAL LOW (ref 22–32)
Calcium: 9 mg/dL (ref 8.9–10.3)
Chloride: 108 mmol/L (ref 98–111)
Creatinine, Ser: 2.35 mg/dL — ABNORMAL HIGH (ref 0.44–1.00)
GFR calc Af Amer: 24 mL/min — ABNORMAL LOW (ref >60)
GFR calc non Af Amer: 21 mL/min — ABNORMAL LOW (ref >60)
Glucose, Bld: 85 mg/dL (ref 70–99)
Potassium: 3.5 mmol/L (ref 3.5–5.1)
Sodium: 139 mmol/L (ref 135–145)
Total Bilirubin: 1.7 mg/dL — ABNORMAL HIGH (ref 0.3–1.2)
Total Protein: 6.3 g/dL — ABNORMAL LOW (ref 6.5–8.1)

## 2020-06-20 LAB — BRAIN NATRIURETIC PEPTIDE: B Natriuretic Peptide: >4500 pg/mL — ABNORMAL HIGH (ref 0.0–100.0)

## 2020-06-20 LAB — MAGNESIUM: Magnesium: 1.8 mg/dL (ref 1.7–2.4)

## 2020-06-20 LAB — VITAMIN B12: Vitamin B-12: 898 pg/mL (ref 180–914)

## 2020-06-20 MED ORDER — LOPERAMIDE HCL 2 MG PO TABS: 2.0000 mg | ORAL_TABLET | Freq: Four times a day (QID) | ORAL | 0 refills | Status: DC | PRN

## 2020-06-20 MED ORDER — HYDROXYZINE HCL 10 MG PO TABS: 10.0000 mg | ORAL_TABLET | Freq: Two times a day (BID) | ORAL | 0 refills | Status: DC | PRN

## 2020-06-20 NOTE — Assessment & Plan Note (Addendum)
Chronic diarrhea present for many years, she is not sure how long, chart review states the 1990s. She does have history of ischemic colitis, work-up for malabsorption showed vitamin D deficiency but normal B12 and ferritin. Colonoscopy 2013 negative for IBD. TSH has been checked multiple times and is normal. She did previously associated with certain dietary intake. She does not have weight loss, fever, abdominal pain or other systemic symptoms.  She describes the stool as brown and watery. She usually has 4-5 BM per day. Episodes are intermittent and have sometimes resolved with imodium. She has a history of positive C. Diff pcr in 2011 and 2016 but with chronic symptoms and otherwise asymptomatic, I do not think this is likely cause of her diarrhea.   - stop lomotil with AE of itching  - restart imodium prn  - possibly diet related, will need to consider food diary at follow-up  - previous TSH, B12, ferritin normal. Will obtain ESR and CBC - discuss referral to GI at follow-up

## 2020-06-20 NOTE — Patient Instructions (Signed)
Thank you for allowing [place] to provide your care today. Today we discussed your itching and lower extremity swelling.    I have ordered the following labs for you:  BNP, CMP   Please complete echocardiogram    I will call if any are abnormal.    Today we made the following changes to your medications:   Please START taking  Loperamide as needed for chronic diarrhea  Lasix 40 mg - take two per day for five days  Please STOP taking   Diphenoxylate-atropine  Please follow-up in 5 days.    Please call the internal medicine center clinic if you have any questions or concerns, we may be able to help and keep you from a long and expensive emergency room wait. Our clinic and after hours phone number is [phone], the best time to call is Monday through Friday 9 am to 4 pm but there is always someone available 24/7 if you have an emergency. If you need medication refills please notify your pharmacy one week in advance and they will send [place] a request.

## 2020-06-20 NOTE — Assessment & Plan Note (Signed)
Presents with new onset itching and lower extremity swelling she states has been present since starting diphenoxylate-atropine four days ago. The itching is all over her body and especially her back. She has not noticed any rash. No tingling or burning.  No rashes on PE, she does have scratches from scratching her skin so much. This appears to be a common side effect of this medication.   - stop medication - vistaril 10 mg bid prn, reduced dose with CKD

## 2020-06-20 NOTE — Assessment & Plan Note (Signed)
The swelling started in her lower extremities 10 days ago. She has been taking her lasix 40 mg daily. She denies chest pain, palpitations, shortness of breath, orthopnea, changes in urination, dysuria, or back pain. She does feel more bloated than usual but has no bloody stools, dark stools, nausea or abdominal pain. On physical exam she has 2+ pitting edema to the upper thighs, regular rhythm without murmur, lungs are clear, no JVP. She has a history of complete heart block with pacemaker, and cardiomyopathy. Notes state last echo in 2016 showed an EF of 20-25%, global hypokinesis, moderate TR, and PASP of 68 mmHg.   - Does not appear to be venous insufficiency with degree of pitting. With her history of HF will obtain CMP, BNP, and echo.  - increase lasix to 40 mg bid for five days, f/u 9/27  ADDENDUM: BNP >4500. Last BNP 626 in 2016 although she has had prior similar elevations in BNP. As she is asymptomatic except for LE edema have discussed her case with heart failure at home, and they will review her case. Otherwise will follow-up with her 9/23.
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
## 2020-06-25 ENCOUNTER — Encounter: Payer: Self-pay | Admitting: Internal Medicine

## 2020-06-25 ENCOUNTER — Ambulatory Visit (INDEPENDENT_AMBULATORY_CARE_PROVIDER_SITE_OTHER): Payer: Medicaid Other | Admitting: Internal Medicine

## 2020-06-25 ENCOUNTER — Other Ambulatory Visit: Payer: Self-pay

## 2020-06-25 ENCOUNTER — Other Ambulatory Visit: Payer: Self-pay | Admitting: Internal Medicine

## 2020-06-25 VITALS — BP 161/78 | HR 59 | Temp 98.5°F

## 2020-06-25 DIAGNOSIS — Z23 Encounter for immunization: Secondary | ICD-10-CM

## 2020-06-25 DIAGNOSIS — I509 Heart failure, unspecified: Secondary | ICD-10-CM | POA: Diagnosis not present

## 2020-06-25 DIAGNOSIS — L299 Pruritus, unspecified: Secondary | ICD-10-CM

## 2020-06-25 MED ORDER — HYDROXYZINE HCL 10 MG PO TABS: 10.0000 mg | ORAL_TABLET | Freq: Two times a day (BID) | ORAL | 0 refills | Status: DC | PRN

## 2020-06-25 NOTE — Telephone Encounter (Signed)
hydrOXYzine (ATARAX/VISTARIL) 10 MG tablet, REFILL REQUEST @  [place], [place] Phone:  [phone]  Fax:  [phone]

## 2020-06-25 NOTE — Assessment & Plan Note (Addendum)
She continues to have swelling, although this is improving. She has been taking lasix 40 mg bid for the last five days. No shortness of breath or cough. She is not sure if heart failure at home has come by but people have been coming to the house.  Spoke with Reche Dixon with heart failure at home. They have been out to see her and have had her continued on lasix 40 mg bid. They have obtained labs for her and will fax these over. Cr yesterday 1.2, K 3.3, Mg was not drawn but they will plan to at f/u.    - continue heart failure at home - they will titrate her lasix for diuresis - complete echo  - f/u in one month or sooner if symptoms worsen
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
## 2020-06-25 NOTE — Patient Instructions (Addendum)
Thank you for allowing [place] to provide your care today. Today we discussed your heart failure   Please follow-up on Friday, October 1st for your echo. Otherwise please follow-up with the clinic in one month.   Please call the internal medicine center clinic if you have any questions or concerns, we may be able to help and keep you from a long and expensive emergency room wait. Our clinic and after hours phone number is [phone], the best time to call is Monday through Friday 9 am to 4 pm but there is always someone available 24/7 if you have an emergency. If you need medication refills please notify your pharmacy one week in advance and they will send [place] a request.

## 2020-06-28 NOTE — Progress Notes (Signed)
Internal Medicine Clinic Attending  Case discussed with Dr. Seawell  At the time of the visit.  We reviewed the resident's history and exam and pertinent patient test results.  I agree with the assessment, diagnosis, and plan of care documented in the resident's note.  

## 2020-06-29 ENCOUNTER — Telehealth: Payer: Self-pay | Admitting: Internal Medicine

## 2020-06-29 ENCOUNTER — Ambulatory Visit (INDEPENDENT_AMBULATORY_CARE_PROVIDER_SITE_OTHER): Payer: Medicaid Other | Admitting: Internal Medicine

## 2020-06-29 ENCOUNTER — Other Ambulatory Visit: Payer: Self-pay | Admitting: Internal Medicine

## 2020-06-29 ENCOUNTER — Ambulatory Visit (HOSPITAL_COMMUNITY)
Admission: RE | Admit: 2020-06-29 | Discharge: 2020-06-29 | Disposition: A | Discharge status: Home / Self Care | Payer: Medicaid Other | Source: Ambulatory Visit | Attending: Internal Medicine | Admitting: Internal Medicine

## 2020-06-29 ENCOUNTER — Other Ambulatory Visit: Payer: Self-pay

## 2020-06-29 ENCOUNTER — Encounter: Payer: Self-pay | Admitting: Internal Medicine

## 2020-06-29 VITALS — BP 134/94 | HR 60 | Temp 97.6°F | Wt 126.1 lb

## 2020-06-29 DIAGNOSIS — I2510 Atherosclerotic heart disease of native coronary artery without angina pectoris: Secondary | ICD-10-CM | POA: Insufficient documentation

## 2020-06-29 DIAGNOSIS — L299 Pruritus, unspecified: Secondary | ICD-10-CM

## 2020-06-29 DIAGNOSIS — N184 Chronic kidney disease, stage 4 (severe): Secondary | ICD-10-CM | POA: Diagnosis not present

## 2020-06-29 DIAGNOSIS — I420 Dilated cardiomyopathy: Secondary | ICD-10-CM | POA: Insufficient documentation

## 2020-06-29 DIAGNOSIS — I10 Essential (primary) hypertension: Secondary | ICD-10-CM

## 2020-06-29 DIAGNOSIS — I509 Heart failure, unspecified: Secondary | ICD-10-CM | POA: Diagnosis not present

## 2020-06-29 DIAGNOSIS — I129 Hypertensive chronic kidney disease with stage 1 through stage 4 chronic kidney disease, or unspecified chronic kidney disease: Secondary | ICD-10-CM | POA: Diagnosis not present

## 2020-06-29 DIAGNOSIS — R609 Edema, unspecified: Secondary | ICD-10-CM | POA: Diagnosis not present

## 2020-06-29 DIAGNOSIS — I083 Combined rheumatic disorders of mitral, aortic and tricuspid valves: Secondary | ICD-10-CM | POA: Diagnosis not present

## 2020-06-29 DIAGNOSIS — I130 Hypertensive heart and chronic kidney disease with heart failure and stage 1 through stage 4 chronic kidney disease, or unspecified chronic kidney disease: Secondary | ICD-10-CM | POA: Diagnosis not present

## 2020-06-29 LAB — ECHOCARDIOGRAM COMPLETE
Area-P 1/2: 5.23 cm2
Calc EF: 38.8 %
MV M vel: 4.61 m/s
MV Peak grad: 84.8 mmHg
Radius: 0.2 cm
S' Lateral: 3.7 cm
Single Plane A2C EF: 45.6 %
Single Plane A4C EF: 34.1 %

## 2020-06-29 NOTE — Progress Notes (Signed)
  Echocardiogram 2D Echocardiogram has been performed.  [name] [name] 06/29/2020, 9:00 AM

## 2020-06-29 NOTE — Telephone Encounter (Signed)
Per chart review, patient should have several rf's of furosemide on file.    TC to [facility], spoke with pharmacist who confirms rf's are available and that patient picked up this RX on 06/22/20.  TC to patient and informed of above.  Patient states she lost the furosemide medication.  Patient instructed to call pharmacy and and explain to pharmacist she lost RX as he may have to contact her insurance company for early refill approval.  She verbalized understanding. SChaplin, RN,BSN

## 2020-06-29 NOTE — Telephone Encounter (Signed)
Refill Request   furosemide (LASIX) 40 MG tablet  [place], [place]
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
## 2020-06-29 NOTE — Patient Instructions (Signed)
Thank you, Ms.Dreanna Kyllo for allowing [place] to provide your care today. Today we discussed Heart Failure.    I have ordered the following labs for you:   Lab Orders     BMP8+Anion Gap   I will call if any are abnormal. All of your labs can be accessed through "My Chart".  I have place a referrals to Heart Failure Clinic.  I have ordered the following tests: Ultra sound of left leg  I have ordered the following medication/changed the following medications:  1. Continue normal dose of furosemide 40 mg daily until I call you with results.   Please follow-up in 1 weeks.  Should you have any questions or concerns please call the internal medicine clinic at [phone].    [name], D.O. [facility]   My Chart Access: [email or web address]   If you have not already done so, please get your COVID 19 vaccine  To schedule an appointment for a COVID vaccine choice any of the following: Go to [email or web address]   Go to [email or web address]                  Call [phone]                                     Call [phone] and select Option 2

## 2020-06-30 LAB — BMP8+ANION GAP
Anion Gap: 16 mmol/L (ref 10.0–18.0)
BUN/Creatinine Ratio: 15 (ref 12–28)
BUN: 37 mg/dL — ABNORMAL HIGH (ref 8–27)
CO2: 22 mmol/L (ref 20–29)
Calcium: 8.5 mg/dL — ABNORMAL LOW (ref 8.7–10.3)
Chloride: 108 mmol/L — ABNORMAL HIGH (ref 96–106)
Creatinine, Ser: 2.46 mg/dL — ABNORMAL HIGH (ref 0.57–1.00)
GFR calc Af Amer: 23 mL/min/{1.73_m2} — ABNORMAL LOW (ref >59)
GFR calc non Af Amer: 20 mL/min/{1.73_m2} — ABNORMAL LOW (ref >59)
Glucose: 91 mg/dL (ref 65–99)
Potassium: 3.8 mmol/L (ref 3.5–5.2)
Sodium: 146 mmol/L — ABNORMAL HIGH (ref 134–144)

## 2020-06-30 NOTE — Assessment & Plan Note (Addendum)
Patient presents for a follow up for hypervolemia 2/2 CHF. Swelling improved in the right leg with the week course of furosemide 40 mg BID. She denies any symptoms of hypervolemia, including, SHOB, cough, abdominal bloating, orthopnea, or PND. 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 She has associated pain, which collectively makes it difficult to put on her prosthesis.   She denies taking her weight daily and there was not a recorded weight from her last clinic appointment, making it difficult to assess her weight loss over the past week. She does admit to frequent urination.   On exam she has 3+ pitting edema in her left LE. Her abdomen is soft and nontender, No JVD noted. Lungs are clear to ascultation bilaterally.   Plan: - Although patient's med rec shows Xerelto, she doesn't have a great knowledge of the medications she is taking. Therefore, I will get a Vascular US of her left lower extremity to r/o DVT. - Echocardiogram pending - Continue 40 mg lasix daily.  - I will call Reche Dixon to coordinate care of this patient.  - Repeat BMP today.  - Will follow up with this patient in 1 week to discuss her results and make adjustments to her medications.  - Reffer patient to [facility]

## 2020-07-03 ENCOUNTER — Telehealth: Payer: Self-pay | Admitting: Internal Medicine

## 2020-07-03 NOTE — Progress Notes (Signed)
DOS 06/25/20:  Internal Medicine Clinic Attending  Case discussed with Dr. Sharon Seller  At the time of the visit.  We reviewed the resident's history and exam and pertinent patient test results.  I agree with the assessment, diagnosis, and plan of care documented in the resident's note.

## 2020-07-03 NOTE — Progress Notes (Signed)
[name] [name], I saw the result and spoke to the patient. I put in a referral for the Heart Failure Clinic. I will be seeing her back in the clinic on the 8th to further address the echo results and talk about what she may need moving forward. She is going to need close follow up.

## 2020-07-03 NOTE — Telephone Encounter (Signed)
I called patient to inform her of her echo results.  I explained that she has significantly reduced heart function and therefore, it is important for her to follow-up with the heart failure clinic in the near future.  I referred her to the HF clinic earlier this week, and she states that she has not received a phone call to schedule an appointment.  I also referred her for a lower extremity ultrasound on her left leg to rule out DVT.  She states she has not been able to schedule an appointment for the ultrasound either.  I will follow up on the status of these referrals as the patient will need close follow-up with our clinic as well as the heart failure clinic due to her tenuous cardiovascular status.  [name], D.O. [facility], PGY-2 Pager: [phone], Phone: [phone] Date 07/03/2020 Time 4:48 PM

## 2020-07-04 DIAGNOSIS — I129 Hypertensive chronic kidney disease with stage 1 through stage 4 chronic kidney disease, or unspecified chronic kidney disease: Secondary | ICD-10-CM | POA: Diagnosis not present

## 2020-07-05 ENCOUNTER — Ambulatory Visit
Admission: RE | Admit: 2020-07-05 | Discharge: 2020-07-05 | Disposition: A | Discharge status: Home / Self Care | Payer: Medicaid Other | Source: Ambulatory Visit | Attending: Internal Medicine | Admitting: Internal Medicine

## 2020-07-05 ENCOUNTER — Other Ambulatory Visit: Payer: Self-pay

## 2020-07-05 DIAGNOSIS — Z1231 Encounter for screening mammogram for malignant neoplasm of breast: Secondary | ICD-10-CM

## 2020-07-06 ENCOUNTER — Encounter: Payer: Self-pay | Admitting: Internal Medicine

## 2020-07-06 ENCOUNTER — Ambulatory Visit: Payer: Medicaid Other | Admitting: Internal Medicine

## 2020-07-06 VITALS — BP 132/62 | HR 59 | Temp 98.4°F | Wt 134.4 lb

## 2020-07-06 DIAGNOSIS — I5023 Acute on chronic systolic (congestive) heart failure: Secondary | ICD-10-CM

## 2020-07-06 DIAGNOSIS — I509 Heart failure, unspecified: Secondary | ICD-10-CM

## 2020-07-06 MED ORDER — BUMETANIDE 1 MG PO TABS: 1.0000 mg | ORAL_TABLET | Freq: Two times a day (BID) | ORAL | 2 refills | Status: DC

## 2020-07-06 NOTE — Patient Instructions (Signed)
Thank you, [name] for allowing [place] to provide your care today. Today we discussed heart failure.    I have ordered the following labs for you:   Lab Orders     BMP8+Anion Gap     Magnesium   I will call if any are abnormal. All of your labs can be accessed through "My Chart".  I have place a referrals to Heart Failure at [facility] for home diuresis.  I have ordered the following tests: [place] of left lower extremity.    I have ordered the following medication/changed the following medications:  Meds ordered this encounter  Medications  . bumetanide (BUMEX) 1 MG tablet    Sig: Take 1 tablet (1 mg total) by mouth 2 (two) times daily.    Dispense:  60 tablet    Refill:  2   Medications Discontinued During This Encounter  Medication Reason  . furosemide (LASIX) 40 MG tablet Change in therapy     Please follow-up 1 week.  GOALS: - Get IV lasix at home with Heart Failure at home program - Decrease swelling in left lower extremity - Get [place] of left leg.  Should you have any questions or concerns please call the internal medicine clinic at [phone].    [name], D.O. [facility]

## 2020-07-06 NOTE — Progress Notes (Signed)
Internal Medicine Clinic Attending  Case discussed with Dr. Coe  At the time of the visit.  We reviewed the resident's history and exam and pertinent patient test results.  I agree with the assessment, diagnosis, and plan of care documented in the resident's note.  
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
## 2020-07-07 ENCOUNTER — Encounter: Payer: Self-pay | Admitting: Internal Medicine

## 2020-07-07 LAB — BMP8+ANION GAP
Anion Gap: 18 mmol/L (ref 10.0–18.0)
BUN/Creatinine Ratio: 20 (ref 12–28)
BUN: 53 mg/dL — ABNORMAL HIGH (ref 8–27)
CO2: 21 mmol/L (ref 20–29)
Calcium: 8.4 mg/dL — ABNORMAL LOW (ref 8.7–10.3)
Chloride: 103 mmol/L (ref 96–106)
Creatinine, Ser: 2.67 mg/dL — ABNORMAL HIGH (ref 0.57–1.00)
GFR calc Af Amer: 21 mL/min/{1.73_m2} — ABNORMAL LOW (ref >59)
GFR calc non Af Amer: 18 mL/min/{1.73_m2} — ABNORMAL LOW (ref >59)
Glucose: 118 mg/dL — ABNORMAL HIGH (ref 65–99)
Potassium: 3.9 mmol/L (ref 3.5–5.2)
Sodium: 142 mmol/L (ref 134–144)

## 2020-07-07 LAB — MAGNESIUM: Magnesium: 1.8 mg/dL (ref 1.6–2.3)

## 2020-07-07 NOTE — Assessment & Plan Note (Signed)
Patient presents for a follow up appointment for heart failure with reduced ejection fraction. Patient has had roughly an 8 lb weight gain in the last week. She denies SHOB, abdominal bloating, orthopnea or PND. She does admit to significant edema of her lest leg. Patient states that she take all of her medications has prescribed and has her medications organized by a home health aid. I spoke with the heart failure at home nurse who states that the patient organizers her own medications and is only prompted to take her AM medications by the home health aid. Furthermore, she admitted to frequently missing doses of her medications particularly in the evening. She is currently taking lasix 40 mg daily with progression of her hypervolemia.   I plan to make some changes to her heart failure medications over the next week as she becomes euvolemic. Specifically, I will switch her to Bumex 2 mg daily, Entresto 24-26 mg twice daily, and Empagloflozin 10 mg daily. I will hold off on making these changes as I plan to start IV diuresis with the hospital at home program starting Saturday (10/09). Will start Lasix 80 mg IV daily. Will have the hospital at home team re-evaluate the patient daily and monitor kidney function and electrolytes.  Plan: - Start Furosemide 80 mg IV daily (3-5 days depending on response) - Daily BMP, Mg, and weights  - Once IV diuresis is complete, I will switch the patient to Bumex 2 mg daily, Entresto 24-26 mg twice daily, and Empagloflozin 10 mg daily. I will also discontinue valsartan mg daily.

## 2020-07-10 ENCOUNTER — Ambulatory Visit (HOSPITAL_COMMUNITY)
Admission: RE | Admit: 2020-07-10 | Discharge: 2020-07-10 | Disposition: A | Discharge status: Home / Self Care | Payer: Medicaid Other | Source: Ambulatory Visit | Attending: Internal Medicine | Admitting: Internal Medicine

## 2020-07-10 DIAGNOSIS — I5020 Unspecified systolic (congestive) heart failure: Secondary | ICD-10-CM | POA: Diagnosis not present

## 2020-07-10 DIAGNOSIS — I129 Hypertensive chronic kidney disease with stage 1 through stage 4 chronic kidney disease, or unspecified chronic kidney disease: Secondary | ICD-10-CM | POA: Diagnosis not present

## 2020-07-10 DIAGNOSIS — R609 Edema, unspecified: Secondary | ICD-10-CM | POA: Diagnosis not present

## 2020-07-10 NOTE — Addendum Note (Signed)
Addended by: Lalla Brothers T on: 07/10/2020 09:09 AM   Modules accepted: Level of Service

## 2020-07-10 NOTE — Progress Notes (Signed)
Internal Medicine Clinic Attending  Case discussed with Dr. Coe  At the time of the visit.  We reviewed the resident's history and exam and pertinent patient test results.  I agree with the assessment, diagnosis, and plan of care documented in the resident's note.  

## 2020-07-10 NOTE — Progress Notes (Signed)
Lower extremity venous has been completed.   Preliminary results in CV Proc.   Abram Sander 07/10/2020 9:27 AM

## 2020-07-10 NOTE — Progress Notes (Signed)
Patient was notified of her results.

## 2020-07-12 ENCOUNTER — Other Ambulatory Visit: Payer: Self-pay

## 2020-07-12 DIAGNOSIS — L299 Pruritus, unspecified: Secondary | ICD-10-CM

## 2020-07-12 NOTE — Telephone Encounter (Signed)
Will refill this at her appointment tomorrow after speaking with her. Thank you.

## 2020-07-13 ENCOUNTER — Other Ambulatory Visit: Payer: Self-pay

## 2020-07-13 ENCOUNTER — Ambulatory Visit: Payer: Medicaid Other | Admitting: Internal Medicine

## 2020-07-13 VITALS — BP 150/62 | HR 59 | Temp 98.2°F | Ht 61.0 in | Wt 129.3 lb

## 2020-07-13 DIAGNOSIS — E1122 Type 2 diabetes mellitus with diabetic chronic kidney disease: Secondary | ICD-10-CM

## 2020-07-13 DIAGNOSIS — E119 Type 2 diabetes mellitus without complications: Secondary | ICD-10-CM | POA: Insufficient documentation

## 2020-07-13 DIAGNOSIS — N184 Chronic kidney disease, stage 4 (severe): Secondary | ICD-10-CM

## 2020-07-13 DIAGNOSIS — Z794 Long term (current) use of insulin: Secondary | ICD-10-CM | POA: Diagnosis not present

## 2020-07-13 DIAGNOSIS — I509 Heart failure, unspecified: Secondary | ICD-10-CM | POA: Diagnosis not present

## 2020-07-13 DIAGNOSIS — I129 Hypertensive chronic kidney disease with stage 1 through stage 4 chronic kidney disease, or unspecified chronic kidney disease: Secondary | ICD-10-CM | POA: Diagnosis not present

## 2020-07-13 MED ORDER — EMPAGLIFLOZIN 10 MG PO TABS: 10.0000 mg | ORAL_TABLET | Freq: Every day | ORAL | 2 refills | Status: DC

## 2020-07-13 MED ORDER — ENTRESTO 24-26 MG PO TABS: 1.0000 | ORAL_TABLET | Freq: Two times a day (BID) | ORAL | 2 refills | Status: DC
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
## 2020-07-13 NOTE — Patient Instructions (Signed)
Thank you, [name] for allowing [place] to provide your care today. Today we discussed heart failure.    I have ordered the following labs for you:   Lab Orders     BMP8+Anion Gap     Magnesium   I will call if any are abnormal. All of your labs can be accessed through "My Chart".  Referrals and tests ordered today:    Referral Orders     Ambulatory referral to Chronic Care Management Services   I have ordered the following medication/changed the following medications:   Medications Discontinued During This Encounter  Medication Reason   valsartan (DIOVAN) 80 MG tablet      Meds ordered this encounter  Medications   sacubitril-valsartan (ENTRESTO) 24-26 MG    Sig: Take 1 tablet by mouth 2 (two) times daily.    Dispense:  60 tablet    Refill:  2   empagliflozin (JARDIANCE) 10 MG TABS tablet    Sig: Take 1 tablet (10 mg total) by mouth daily before breakfast.    Dispense:  30 tablet    Refill:  2     Follow up: 1 week    Remember: Make sure to take all of your medications as prescribed and where the stump compression sock.  Should you have any questions or concerns please call the internal medicine clinic at [phone].     [name], D.O. [place]

## 2020-07-15 LAB — BMP8+ANION GAP
Anion Gap: 17 mmol/L (ref 10.0–18.0)
BUN/Creatinine Ratio: 17 (ref 12–28)
BUN: 56 mg/dL — ABNORMAL HIGH (ref 8–27)
CO2: 24 mmol/L (ref 20–29)
Calcium: 9.4 mg/dL (ref 8.7–10.3)
Chloride: 100 mmol/L (ref 96–106)
Creatinine, Ser: 3.38 mg/dL — ABNORMAL HIGH (ref 0.57–1.00)
GFR calc Af Amer: 15 mL/min/{1.73_m2} — ABNORMAL LOW (ref >59)
GFR calc non Af Amer: 13 mL/min/{1.73_m2} — ABNORMAL LOW (ref >59)
Glucose: 86 mg/dL (ref 65–99)
Potassium: 4.9 mmol/L (ref 3.5–5.2)
Sodium: 141 mmol/L (ref 134–144)

## 2020-07-15 LAB — MAGNESIUM: Magnesium: 2.4 mg/dL — ABNORMAL HIGH (ref 1.6–2.3)

## 2020-07-16 ENCOUNTER — Encounter (HOSPITAL_COMMUNITY): Payer: Self-pay

## 2020-07-16 ENCOUNTER — Telehealth: Payer: Self-pay | Admitting: Internal Medicine

## 2020-07-16 ENCOUNTER — Encounter: Payer: Self-pay | Admitting: Internal Medicine

## 2020-07-16 ENCOUNTER — Other Ambulatory Visit: Payer: Self-pay

## 2020-07-16 ENCOUNTER — Emergency Department (HOSPITAL_COMMUNITY)
Admission: EM | Admit: 2020-07-16 | Discharge: 2020-07-17 | Disposition: A | Discharge status: Home / Self Care | Payer: Medicaid Other | Attending: Emergency Medicine | Admitting: Emergency Medicine

## 2020-07-16 DIAGNOSIS — R0602 Shortness of breath: Secondary | ICD-10-CM | POA: Diagnosis not present

## 2020-07-16 DIAGNOSIS — R799 Abnormal finding of blood chemistry, unspecified: Secondary | ICD-10-CM | POA: Diagnosis present

## 2020-07-16 DIAGNOSIS — E119 Type 2 diabetes mellitus without complications: Secondary | ICD-10-CM | POA: Diagnosis not present

## 2020-07-16 DIAGNOSIS — I5042 Chronic combined systolic (congestive) and diastolic (congestive) heart failure: Secondary | ICD-10-CM | POA: Diagnosis not present

## 2020-07-16 DIAGNOSIS — I110 Hypertensive heart disease with heart failure: Secondary | ICD-10-CM | POA: Diagnosis not present

## 2020-07-16 DIAGNOSIS — I2510 Atherosclerotic heart disease of native coronary artery without angina pectoris: Secondary | ICD-10-CM | POA: Diagnosis not present

## 2020-07-16 DIAGNOSIS — N189 Chronic kidney disease, unspecified: Secondary | ICD-10-CM

## 2020-07-16 DIAGNOSIS — N184 Chronic kidney disease, stage 4 (severe): Secondary | ICD-10-CM | POA: Insufficient documentation

## 2020-07-16 DIAGNOSIS — Z96642 Presence of left artificial hip joint: Secondary | ICD-10-CM | POA: Diagnosis not present

## 2020-07-16 DIAGNOSIS — Z79899 Other long term (current) drug therapy: Secondary | ICD-10-CM | POA: Diagnosis not present

## 2020-07-16 DIAGNOSIS — I130 Hypertensive heart and chronic kidney disease with heart failure and stage 1 through stage 4 chronic kidney disease, or unspecified chronic kidney disease: Secondary | ICD-10-CM | POA: Diagnosis not present

## 2020-07-16 DIAGNOSIS — I120 Hypertensive chronic kidney disease with stage 5 chronic kidney disease or end stage renal disease: Secondary | ICD-10-CM | POA: Diagnosis not present

## 2020-07-16 DIAGNOSIS — F17210 Nicotine dependence, cigarettes, uncomplicated: Secondary | ICD-10-CM | POA: Insufficient documentation

## 2020-07-16 DIAGNOSIS — J189 Pneumonia, unspecified organism: Secondary | ICD-10-CM | POA: Diagnosis not present

## 2020-07-16 LAB — CBC
HCT: 32.2 % — ABNORMAL LOW (ref 36.0–46.0)
Hemoglobin: 10.1 g/dL — ABNORMAL LOW (ref 12.0–15.0)
MCH: 30.4 pg (ref 26.0–34.0)
MCHC: 31.4 g/dL (ref 30.0–36.0)
MCV: 97 fL (ref 80.0–100.0)
Platelets: 129 10*3/uL — ABNORMAL LOW (ref 150–400)
RBC: 3.32 MIL/uL — ABNORMAL LOW (ref 3.87–5.11)
RDW: 15.9 % — ABNORMAL HIGH (ref 11.5–15.5)
WBC: 5 10*3/uL (ref 4.0–10.5)
nRBC: 0 % (ref 0.0–0.2)

## 2020-07-16 LAB — COMPREHENSIVE METABOLIC PANEL
ALT: 16 U/L (ref 0–44)
AST: 25 U/L (ref 15–41)
Albumin: 3.4 g/dL — ABNORMAL LOW (ref 3.5–5.0)
Alkaline Phosphatase: 75 U/L (ref 38–126)
Anion gap: 12 (ref 5–15)
BUN: 52 mg/dL — ABNORMAL HIGH (ref 8–23)
CO2: 19 mmol/L — ABNORMAL LOW (ref 22–32)
Calcium: 9.2 mg/dL (ref 8.9–10.3)
Chloride: 107 mmol/L (ref 98–111)
Creatinine, Ser: 2.89 mg/dL — ABNORMAL HIGH (ref 0.44–1.00)
GFR, Estimated: 16 mL/min — ABNORMAL LOW (ref >60)
Glucose, Bld: 101 mg/dL — ABNORMAL HIGH (ref 70–99)
Potassium: 4.3 mmol/L (ref 3.5–5.1)
Sodium: 138 mmol/L (ref 135–145)
Total Bilirubin: 1.5 mg/dL — ABNORMAL HIGH (ref 0.3–1.2)
Total Protein: 7 g/dL (ref 6.5–8.1)

## 2020-07-16 LAB — URINALYSIS, ROUTINE W REFLEX MICROSCOPIC
Bilirubin Urine: NEGATIVE
Glucose, UA: NEGATIVE mg/dL
Ketones, ur: NEGATIVE mg/dL
Leukocytes,Ua: NEGATIVE
Nitrite: NEGATIVE
Protein, ur: 30 mg/dL — AB
Specific Gravity, Urine: 1.008 (ref 1.005–1.030)
pH: 6 (ref 5.0–8.0)

## 2020-07-16 NOTE — Assessment & Plan Note (Signed)
Continues to have worsening kidney failure.  She appears to have a difficult time managing her comorbidities at home despite optimal medication management.  I believe this is contributing to her worsening kidney function.  I am concerned the patient's kidney function will continue to decline and will likely need hemodialysis in the near future.  Therefore, I will refer the patient to nephrology at this time.  Plan: 1. Nephrology refarral

## 2020-07-16 NOTE — Assessment & Plan Note (Addendum)
Patient presents for follow-up for CHF.  Patient was restarted on hospital at home for IV diuresis.  She is currently down 5 pounds since her last visit.  She weighs 129 today.  I spoke to the patient's heart failure home nurse.  She is concerned the patient may not be taking her nightly medications.  This was based off of the presence of multiple expired medications in their bottles with little organization.  I offered a pill organizer today and emphasized the importance of taking all of her medications as prescribed.  I counseled the patient on starting Entresto and empagliflozin to further titrate her goal-directed heart failure medications today.  I will also put in a referral for chronic care management to assist with these medications, as patient states that she does not have any money to afford expensive medications.  Furthermore, I will call the heart failure clinic to see if she can get a earlier appointment.  On exam patient continues to have significant swelling of the left BKA stump.  She has some swelling of her right lower extremity as well.  Otherwise she denies any symptoms of hypervolemia including shortness of breath, abdominal bloating, or JVD.  Plan: 1.  Start empagliflozin 10 mg daily and Entresto 24-26 mg twice daily 2.  Consult chronic care management 3.  I will call heart failure clinic to see if patient can get a sooner appointment.

## 2020-07-16 NOTE — Telephone Encounter (Signed)
Call patient regarding her most recent kidney function results.  She has had significant increase in her creatinine despite a trial of possible at home IV diuresis and switching her to Bumex for p.o. diuresis subsequently.  Patient continues to have significant swelling around her left BKA stump.  I called the patient to inform her of her her results.  I counseled her the importance of coming to the hospital for further evaluation and management of her acute on chronic heart failure.  She states that she does not have a ride to the hospital and that she cannot afford EMS.  I discussed the importance of getting to the hospital for further evaluation and management and went through the risks and benefits of hospital treatment for her hypervolemia.  Patient states that she will call her home health aide to discuss ways to get to the hospital.  Patient says she would call us back at the clinic when she is been able to secure a ride to the hospital.  [name], D.O. [facility], PGY-2 Pager: [phone], Phone: [phone] Date 07/16/2020 Time 4:14 PM

## 2020-07-16 NOTE — Telephone Encounter (Signed)
Call patient regarding her most recent kidney function results.  Patient's kidney function has significantly worsened since her last visit.  Creatinine has gone from 2.6 to 3.38.  Patient has failed IV diuresis with heart failure at home as well as increased p.o. diuretics with Bumex.  Patient continues to have signs and symptoms of hypervolemia including significant lower extremity edema around her left BKA stump.  I discussed the risk and benefits of coming to the emergency room for further evaluation and management of her acute on chronic CHF.  She states that she does not have a ride and that she would not be able to afford and EMS.  Patient states she would call us back when she is able to secure a ride.  I reaffirmed the importance of coming to the hospital for further evaluation and stated that she will likely need to call EMS for transport if you cannot secure a ride otherwise.  [name], D.O. [facility], PGY-2 Pager: [phone], Phone: [phone] Date 07/16/2020 Time 4:22 PM

## 2020-07-16 NOTE — ED Triage Notes (Signed)
Pt sent by PCP for further evaluation of elevated creatinine. Pt also having increased lower extremity edema. Pt has no complaints, denies any pain

## 2020-07-17 ENCOUNTER — Emergency Department (HOSPITAL_COMMUNITY): Payer: Medicaid Other

## 2020-07-17 DIAGNOSIS — R0602 Shortness of breath: Secondary | ICD-10-CM | POA: Diagnosis not present

## 2020-07-17 DIAGNOSIS — J189 Pneumonia, unspecified organism: Secondary | ICD-10-CM | POA: Diagnosis not present
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
## 2020-07-18 ENCOUNTER — Telehealth: Payer: Self-pay | Admitting: *Deleted

## 2020-07-18 ENCOUNTER — Telehealth: Payer: Self-pay

## 2020-07-18 NOTE — Telephone Encounter (Signed)
Email sent to  Internal Medicine to  request a follow up  appointment . Gilman Olazabal [facility]  [phone]

## 2020-07-18 NOTE — Chronic Care Management (AMB) (Signed)
  Care Management   Note  07/18/2020 Name: [name] [name] MRN: [id number] DOB: August 13, 1951  [name] [name] is a 69 y.o. year old female who is a primary care patient of [name], MD. I reached out to Demetrius Charity by phone today in response to a referral sent by [name] [name] PCP   [name] [name] was given information about care management services today including:  1. Care management services include personalized support from designated clinical staff supervised by her physician, including individualized plan of care and coordination with other care providers 2. 24/7 contact phone numbers for assistance for urgent and routine care needs. 3. The patient may stop care management services at any time by phone call to the office staff.  Patient agreed to services and verbal consent obtained.   Follow up plan: Telephone appointment with care management team member scheduled for:07/24/2020  [address], [place], [place] [address] Direct Dial: [email or web address] [email or web address]

## 2020-07-18 NOTE — Telephone Encounter (Signed)
Transition Care Management Follow-up Telephone Call  Date of discharge and from where: 07/16/2020 from [facility]  How have you been since you were released from the hospital? Patient states that she is feeling better.   Any questions or concerns? No  Items Reviewed:  Did the pt receive and understand the discharge instructions provided? Yes   Medications obtained and verified? Yes   Other? No   Any new allergies since your discharge? No   Dietary orders reviewed? N/A  Do you have support at home? Yes   Functional Questionnaire: (I = Independent and D = Dependent) ADLs: I - patient is a wheelchair  Bathing/Dressing- I  Meal Prep- I  Eating- I  Maintaining continence- I  Transferring/Ambulation- D - Patient is in wheelchair.   Managing Meds- I  Follow up appointments reviewed:   PCP Hospital f/u appt confirmed? Yes  Scheduled to see SW on 07/24/2020 @ 10:30am.  Are transportation arrangements needed? No   If their condition worsens, is the pt aware to call PCP or go to the Emergency Dept.? Yes  Was the patient provided with contact information for the PCP's office or ED? Yes   Was to pt encouraged to call back with questions or concerns? Yes

## 2020-07-24 ENCOUNTER — Ambulatory Visit: Payer: Medicaid Other

## 2020-07-24 DIAGNOSIS — N184 Chronic kidney disease, stage 4 (severe): Secondary | ICD-10-CM

## 2020-07-24 DIAGNOSIS — I129 Hypertensive chronic kidney disease with stage 1 through stage 4 chronic kidney disease, or unspecified chronic kidney disease: Secondary | ICD-10-CM

## 2020-07-24 DIAGNOSIS — I509 Heart failure, unspecified: Secondary | ICD-10-CM

## 2020-07-25 ENCOUNTER — Ambulatory Visit: Payer: Medicaid Other | Admitting: *Deleted

## 2020-07-25 ENCOUNTER — Ambulatory Visit: Payer: Medicaid Other

## 2020-07-25 DIAGNOSIS — N184 Chronic kidney disease, stage 4 (severe): Secondary | ICD-10-CM

## 2020-07-25 DIAGNOSIS — I129 Hypertensive chronic kidney disease with stage 1 through stage 4 chronic kidney disease, or unspecified chronic kidney disease: Secondary | ICD-10-CM

## 2020-07-25 DIAGNOSIS — I509 Heart failure, unspecified: Secondary | ICD-10-CM

## 2020-07-25 DIAGNOSIS — I10 Essential (primary) hypertension: Secondary | ICD-10-CM

## 2020-07-25 DIAGNOSIS — H0011 Chalazion right upper eyelid: Secondary | ICD-10-CM | POA: Diagnosis not present

## 2020-07-25 DIAGNOSIS — Z794 Long term (current) use of insulin: Secondary | ICD-10-CM

## 2020-07-25 DIAGNOSIS — E1122 Type 2 diabetes mellitus with diabetic chronic kidney disease: Secondary | ICD-10-CM

## 2020-07-25 NOTE — Chronic Care Management (AMB) (Signed)
Care Management   Initial Visit Note  07/25/2020 Name: [name] [name] MRN: [id number] DOB: 01/14/1951   Assessment: [name] [name] is a 69 y.o. year old female who sees [name] Payment, MD for primary care. The care management team was consulted for assistance with care management and care coordination needs related to Other home meal delivery.   Review of patient status, including review of consultants reports, relevant laboratory and other test results, and collaboration with appropriate care team members and the patient's provider was performed as part of comprehensive patient evaluation and provision of care management services.    SDOH (Social Determinants of Health) assessments performed: No See Care Plan activities for detailed interventions related to [name] [name])     Outpatient Encounter Medications as of 07/25/2020  Medication Sig  . pantoprazole (PROTONIX) 40 MG tablet Take 1 tablet (40 mg total) by mouth daily.  Marland Kitchen albuterol (PROAIR HFA) 108 (90 Base) MCG/ACT inhaler Inhale 2 puffs into the lungs every 6 (six) hours as needed for wheezing or shortness of breath (cough).  Marland Kitchen atorvastatin (LIPITOR) 40 MG tablet Take 1 tablet (40 mg total) by mouth daily.  . bumetanide (BUMEX) 1 MG tablet Take 1 tablet (1 mg total) by mouth 2 (two) times daily.  . calcitRIOL (ROCALTROL) 0.25 MCG capsule Take 0.25 mcg by mouth daily.  . carvedilol (COREG) 12.5 MG tablet Take 1 tablet (12.5 mg total) by mouth 2 (two) times daily with a meal.  . diclofenac sodium (VOLTAREN) 1 % GEL Apply 2 g topically 4 (four) times daily. (Patient taking differently: Apply 2 g topically 2 (two) times daily. )  . diclofenac Sodium (VOLTAREN) 1 % GEL Apply 2 g topically 4 (four) times daily.  . diphenoxylate-atropine (LOMOTIL) 2.5-0.025 MG/5ML liquid Take 5 mLs by mouth 4 (four) times daily as needed for diarrhea or loose stools.  . empagliflozin (JARDIANCE) 10 MG TABS tablet Take 1 tablet (10 mg total) by mouth daily  before breakfast.  . gabapentin (NEURONTIN) 300 MG capsule Take 1 capsule (300 mg total) by mouth 2 (two) times daily.  . hydrOXYzine (ATARAX/VISTARIL) 10 MG tablet Take 1 tablet (10 mg total) by mouth 2 (two) times daily as needed for itching.  . loperamide (IMODIUM A-D) 2 MG tablet Take 1 tablet (2 mg total) by mouth 4 (four) times daily as needed for diarrhea or loose stools.  . Rivaroxaban (XARELTO) 15 MG TABS tablet Take 1 tablet (15 mg total) by mouth daily with supper.  . sacubitril-valsartan (ENTRESTO) 24-26 MG Take 1 tablet by mouth 2 (two) times daily.  . sodium bicarbonate 650 MG tablet Take 650 mg by mouth 3 (three) times daily.  . TRAVATAN Z 0.004 % SOLN ophthalmic solution Place 1 drop into both eyes at bedtime.   No facility-administered encounter medications on file as of 07/25/2020.    Goals Addressed              This Visit's Progress     Patient Stated   .  Patient told CCM BSW on 07/24/20, ' I wold like meals delivered to my home. (pt-stated)        [place] (see longtitudinal plan of care for additional care plan information)   Current Barriers:  . Chronic Disease Management support, education, and care coordination needs related to CAD, HTN, HLD, DMII, and CKD Stage 4  Case Manager Clinical Goal(s):  Marland Kitchen Over the next 30-60 days, patient will work with BSW to address needs related to  meals  delivered to home  in patient with CAD, HTN, HLD, DMII, and CKD Stage 4  Interventions:  . Collaborated with BSW to initiate plan of care to address needs related to meals delivered to home in patient with CAD, HTN, HLD, DMII, and CKD Stage 4  Patient Self Care Activities:  . Patient verbalizes understanding of plan to work with CCM team to assist with meal delivery to home  Initial goal documentation         Follow up plan:  The care management team will reach out to the patient again over the next 7-14 days.   [name] [name] was given information about Care  Management services today including:  1. Care Management services include personalized support from designated clinical staff supervised by a physician, including individualized plan of care and coordination with other care providers 2. 24/7 contact phone numbers for assistance for urgent and routine care needs. 3. The patient may stop Care Management services at any time (effective at the end of the month) by phone call to the office staff.  Patient agreed to services and verbal consent obtained.  [name] RN, CCM, [facility] RN Care Manager [phone]

## 2020-07-25 NOTE — Chronic Care Management (AMB) (Signed)
Chronic Care Management    Clinical Social Work General Note  07/25/2020 Name: [name] [name] MRN: [id number] DOB: 01/19/1951  [name] [name] is a 69 y.o. year old female who is a primary care patient of [name], MD. The CCM was consulted to assist the patient with medication management/assistance..   [name] [name] was given information about Chronic Care Management services today including:  1. CCM service includes personalized support from designated clinical staff supervised by her physician, including individualized plan of care and coordination with other care providers 2. 24/7 contact phone numbers for assistance for urgent and routine care needs. 3. Service will only be billed when office clinical staff spend 20 minutes or more in a month to coordinate care. 4. Only one practitioner may furnish and bill the service in a calendar month. 5. The patient may stop CCM services at any time (effective at the end of the month) by phone call to the office staff. 6. The patient will be responsible for cost sharing (co-pay) of up to 20% of the service fee (after annual deductible is met).  Patient agreed to services and verbal consent obtained.   Review of patient status, including review of consultants reports, relevant laboratory and other test results, and collaboration with appropriate care team members and the patient's provider was performed as part of comprehensive patient evaluation and provision of chronic care management services.    SDOH (Social Determinants of Health) assessments and interventions performed:  Yes SDOH Interventions     Most Recent Value  SDOH Interventions  Food Insecurity Interventions Other (Comment)  [patient requesting meal delivery]  Transportation Interventions Other (Comment)  [Patient utilizes Medicaid transportation and Access [place]]       Outpatient Encounter Medications as of 07/24/2020  Medication Sig  . pantoprazole (PROTONIX) 40 MG  tablet Take 1 tablet (40 mg total) by mouth daily.  Marland Kitchen albuterol (PROAIR HFA) 108 (90 Base) MCG/ACT inhaler Inhale 2 puffs into the lungs every 6 (six) hours as needed for wheezing or shortness of breath (cough).  Marland Kitchen atorvastatin (LIPITOR) 40 MG tablet Take 1 tablet (40 mg total) by mouth daily.  . bumetanide (BUMEX) 1 MG tablet Take 1 tablet (1 mg total) by mouth 2 (two) times daily.  . calcitRIOL (ROCALTROL) 0.25 MCG capsule Take 0.25 mcg by mouth daily.  . carvedilol (COREG) 12.5 MG tablet Take 1 tablet (12.5 mg total) by mouth 2 (two) times daily with a meal.  . diclofenac sodium (VOLTAREN) 1 % GEL Apply 2 g topically 4 (four) times daily. (Patient taking differently: Apply 2 g topically 2 (two) times daily. )  . diclofenac Sodium (VOLTAREN) 1 % GEL Apply 2 g topically 4 (four) times daily.  . diphenoxylate-atropine (LOMOTIL) 2.5-0.025 MG/5ML liquid Take 5 mLs by mouth 4 (four) times daily as needed for diarrhea or loose stools.  . empagliflozin (JARDIANCE) 10 MG TABS tablet Take 1 tablet (10 mg total) by mouth daily before breakfast.  . gabapentin (NEURONTIN) 300 MG capsule Take 1 capsule (300 mg total) by mouth 2 (two) times daily.  . hydrOXYzine (ATARAX/VISTARIL) 10 MG tablet Take 1 tablet (10 mg total) by mouth 2 (two) times daily as needed for itching.  . loperamide (IMODIUM A-D) 2 MG tablet Take 1 tablet (2 mg total) by mouth 4 (four) times daily as needed for diarrhea or loose stools.  . Rivaroxaban (XARELTO) 15 MG TABS tablet Take 1 tablet (15 mg total) by mouth daily with supper.  . sacubitril-valsartan (ENTRESTO) 24-26  MG Take 1 tablet by mouth 2 (two) times daily.  . sodium bicarbonate 650 MG tablet Take 650 mg by mouth 3 (three) times daily.  . TRAVATAN Z 0.004 % SOLN ophthalmic solution Place 1 drop into both eyes at bedtime.   No facility-administered encounter medications on file as of 07/24/2020.    Goals Addressed              This Visit's Progress   .  "I would like to  have meals delivered to my home" (pt-stated)        Current Barriers:    Case Manager Clinical Goal(s):  Marland Kitchen Over the next 180 days, patient will work with BSW to address needs related to  meal delivery . Over the next 180 days, BSW will collaborate with RN Care Manager to address care management and care coordination needs  Interventions:  . Patient interviewed and appropriate assessments performed . Collaborated with [facility] RN, [name]. . Informed RNCM, [name], that patient denies need for medication assistance but does need assistance with medication management.  . Attempted to contact [name], for collaboration however she is out of the office until tomorrow . Sent message to [name] with [facility] [place] to determine if patient has already been added to wait list . Collaborated with Consulting civil engineer and patient to establish an individualized plan of care   Patient Self Care Activities:  . Attends all scheduled provider appointments . Calls provider office for new concerns or questions . Patient unable to independently manage medications . Patient unable to independently manage chronic medical conditions  Initial goal documentation         Follow Up Plan: Plan to collaborate with [name], [name] [phone]

## 2020-07-25 NOTE — Progress Notes (Signed)
Internal Medicine Clinic Resident  I have personally reviewed this encounter including the documentation in this note and/or discussed this patient with the care management provider. I will address any urgent items identified by the care management provider and will communicate my actions to the patient's PCP. I have reviewed the patient's CCM visit with my supervising attending, Dr Vincent.  Athena Baltz, MD  IMTS PGY-2 07/25/2020    

## 2020-07-25 NOTE — Patient Instructions (Signed)
Visit Information  Goals Addressed              This Visit's Progress   .  "I would like to have meals delivered to my home" (pt-stated)        Current Barriers: 07/24/20:  Patient referred to [facility] for medication management and assistance.  Patient denied need for medication assistance during initial SW assessment. Completed SDOH and only specific SW need identified was referral to Meals on Wheels.  07/25/20: [facility] SW, Alice Ward, reports that medication management is biggest concern for patient at this time.  She has discussed [facility] with patient but patient declined.   [name] plans to conduct home visit along with patient's PCS aide to further discuss benefit of program.     Case Manager Clinical Goal(s):  Marland Kitchen Over the next 180 days, patient will work with BSW to address needs related to  meal delivery . Over the next 180 days, BSW will collaborate with RN Care Manager to address care management and care coordination needs  Interventions:  . Collaborated with [facility] SW, Alice Ward, regarding community resource needs for patient. . Received response from [name] with [facility] that patient is not on wait list . Submitted referral to Meals on Wheels via [employer] [place] platform  Patient Self Care Activities:  . Attends all scheduled provider appointments . Calls provider office for new concerns or questions . Patient unable to independently manage medications . Patient unable to independently manage chronic medical conditions  Please see past updates related to this goal by clicking on the "Past Updates" button in the selected goal        Remote [facility] SW has agreed to follow up once home visit has been conducted.       Ronn Melena, [place] Coordination Social Worker [place] [phone]

## 2020-07-25 NOTE — Progress Notes (Signed)
Internal Medicine Clinic Attending  CCM services provided by the care management provider and their documentation were discussed with Dr. Aslam. We reviewed the pertinent findings, urgent action items addressed by the resident and non-urgent items to be addressed by the PCP.  I agree with the assessment, diagnosis, and plan of care documented in the CCM and resident's note.  Devynne Sturdivant Thomas Opaline Reyburn, MD 07/25/2020  

## 2020-07-25 NOTE — Patient Instructions (Signed)
Licensed Clinical Social Worker Visit Information  Goals we discussed today:  Goals Addressed              This Visit's Progress   .  "I would like to have meals delivered to my home" (pt-stated)        Current Barriers:    Case Manager Clinical Goal(s):  Marland Kitchen Over the next 180 days, patient will work with BSW to address needs related to  meal delivery . Over the next 180 days, BSW will collaborate with RN Care Manager to address care management and care coordination needs  Interventions:  . Patient interviewed and appropriate assessments performed . Collaborated with [facility] RN, [name] [name]. . Informed RNCM, [name], that patient denies need for medication assistance but does need assistance with medication management.  . Attempted to contact [place], for collaboration however she is out of the office until tomorrow . Sent message to [name] with [facility] [place] to determine if patient has already been added to wait list . Collaborated with Consulting civil engineer and patient to establish an individualized plan of care   Patient Self Care Activities:  . Attends all scheduled provider appointments . Calls provider office for new concerns or questions . Patient unable to independently manage medications . Patient unable to independently manage chronic medical conditions  Initial goal documentation         Materials provided:   [name] [name] was given information about Chronic Care Management services today including:  1. CCM service includes personalized support from designated clinical staff supervised by her physician, including individualized plan of care and coordination with other care providers 2. 24/7 contact phone numbers for assistance for urgent and routine care needs. 3. Service will only be billed when office clinical staff spend 20 minutes or more in a month to coordinate care. 4. Only one practitioner may furnish and  bill the service in a calendar month. 5. The patient may stop CCM services at any time (effective at the end of the month) by phone call to the office staff. 6. The patient will be responsible for cost sharing (co-pay) of up to 20% of the service fee (after annual deductible is met).  Patient agreed to services and verbal consent obtained.   Patient verbalizes understanding of instructions provided today.   Follow up plan: Plan to collaborate with [place], [place] Worker [place] [phone]

## 2020-07-25 NOTE — Patient Instructions (Signed)
Visit Information  Goals Addressed              This Visit's Progress     Patient Stated   .  Patient told CCM BSW on 07/24/20, ' I wold like meals delivered to my home. (pt-stated)        [place] (see longtitudinal plan of care for additional care plan information)   Current Barriers:  . Chronic Disease Management support, education, and care coordination needs related to CAD, HTN, HLD, DMII, and CKD Stage 4  Case Manager Clinical Goal(s):  Marland Kitchen Over the next 30-60 days, patient will work with BSW to address needs related to  meals delivered to home  in patient with CAD, HTN, HLD, DMII, and CKD Stage 4  Interventions:  . Collaborated with BSW to initiate plan of care to address needs related to meals delivered to home in patient with CAD, HTN, HLD, DMII, and CKD Stage 4  Patient Self Care Activities:  . Patient verbalizes understanding of plan to work with CCM team to assist with meal delivery to home  Initial goal documentation        Ms. Meckler was given information about Care Management services today including:  1. Care Management services include personalized support from designated clinical staff supervised by her physician, including individualized plan of care and coordination with other care providers 2. 24/7 contact phone numbers for assistance for urgent and routine care needs. 3. The patient may stop CCM services at any time (effective at the end of the month) by phone call to the office staff.  Patient agreed to services and verbal consent obtained.   The patient verbalized understanding of instructions provided today and declined a print copy of patient instruction materials.   The care management team will reach out to the patient again over the next 7-14 days.   [name] RN, CCM, [facility] RN Care Manager [phone]

## 2020-07-25 NOTE — Chronic Care Management (AMB) (Signed)
  Care Management   Follow Up Note   07/25/2020 Name: [name] [name] MRN: [id number] DOB: 04-Dec-1950  Referred by: [name], MD Reason for referral : Care Coordination (community resources)   [name] [name] is a 69 y.o. year old female who is a primary care patient of [name], MD. The care management team was consulted for assistance with care management and care coordination needs.    Review of patient status, including review of consultants reports, relevant laboratory and other test results, and collaboration with appropriate care team members and the patient's provider was performed as part of comprehensive patient evaluation and provision of chronic care management services.    SDOH (Social Determinants of Health) assessments performed: No See Care Plan activities for detailed interventions related to [facility])     Advanced Directives: See Care Plan and Vyn[employer] application for related entries.   Goals Addressed              This Visit's Progress   .  "I would like to have meals delivered to my home" (pt-stated)        Current Barriers: 07/24/20:  Patient referred to [facility] for medication management and assistance.  Patient denied need for medication assistance during initial SW assessment. Completed SDOH and only specific SW need identified was referral to Meals on Wheels.  07/25/20: Remote Health SW, Alice Ward, reports that medication management is biggest concern for patient at this time.  She has discussed [facility] with patient but patient declined.   [name] plans to conduct home visit along with patient's PCS aide to further discuss benefit of program.     Case Manager Clinical Goal(s):  Marland Kitchen Over the next 180 days, patient will work with BSW to address needs related to  meal delivery . Over the next 180 days, BSW will collaborate with RN Care Manager to address care management and care coordination needs  Interventions:  . Collaborated with Remote  Health SW, Alice Ward, regarding community resource needs for patient. . Received response from [name] with [facility] that patient is not on wait list . Submitted referral to Meals [employer] Wheels via [employer] [place] platform  Patient Self Care Activities:  . Attends all scheduled provider appointments . Calls provider office for new concerns or questions . Patient unable to independently manage medications . Patient unable to independently manage chronic medical conditions  Please see past updates related to this goal by clicking on the "Past Updates" button in the selected goal          Remote Health SW has agreed to follow up once home visit has been conducted.       Ronn Melena, [place] Coordination Social Worker [place] [phone]

## 2020-07-25 NOTE — Progress Notes (Addendum)
Internal Medicine Clinic Resident  I have personally reviewed this encounter including the documentation in this note and/or discussed this patient with the care management provider. I will address any urgent items identified by the care management provider and will communicate my actions to the patient's PCP. I have reviewed the patient's CCM visit with my supervising attending, Dr. Jimmye Norman.  [name], MD 07/25/2020 [phone]

## 2020-07-25 NOTE — Progress Notes (Signed)
Internal Medicine Clinic Resident  I have personally reviewed this encounter including the documentation in this note and/or discussed this patient with the care management provider. I will address any urgent items identified by the care management provider and will communicate my actions to the patient's PCP. I have reviewed the patient's CCM visit with my supervising attending, Dr Vincent.  [name], MD  IMTS PGY-2 07/25/2020    

## 2020-07-25 NOTE — Progress Notes (Signed)
Internal Medicine Clinic Attending  CCM services provided by the care management provider and their documentation were discussed with Dr. Aslam. We reviewed the pertinent findings, urgent action items addressed by the resident and non-urgent items to be addressed by the PCP.  I agree with the assessment, diagnosis, and plan of care documented in the CCM and resident's note.  Dianne Bady Thomas Juwana Thoreson, MD 07/25/2020  

## 2020-07-26 ENCOUNTER — Other Ambulatory Visit: Payer: Self-pay

## 2020-07-26 DIAGNOSIS — L299 Pruritus, unspecified: Secondary | ICD-10-CM

## 2020-07-26 MED ORDER — HYDROXYZINE HCL 10 MG PO TABS: 10.0000 mg | ORAL_TABLET | Freq: Two times a day (BID) | ORAL | 0 refills | Status: DC | PRN

## 2020-07-26 NOTE — Telephone Encounter (Signed)
hydrOXYzine (ATARAX/VISTARIL) 10 MG tablet, refill request @  [facility], [place] Phone:  [phone]  Fax:  [phone]

## 2020-08-01 ENCOUNTER — Other Ambulatory Visit: Payer: Self-pay

## 2020-08-01 ENCOUNTER — Encounter: Payer: Self-pay | Admitting: Podiatry

## 2020-08-01 ENCOUNTER — Ambulatory Visit: Payer: Medicaid Other | Admitting: Podiatry

## 2020-08-01 DIAGNOSIS — Q828 Other specified congenital malformations of skin: Secondary | ICD-10-CM | POA: Diagnosis not present

## 2020-08-01 DIAGNOSIS — B351 Tinea unguium: Secondary | ICD-10-CM | POA: Diagnosis not present

## 2020-08-01 DIAGNOSIS — M79676 Pain in unspecified toe(s): Secondary | ICD-10-CM

## 2020-08-01 DIAGNOSIS — Z89512 Acquired absence of left leg below knee: Secondary | ICD-10-CM

## 2020-08-01 NOTE — Progress Notes (Signed)
This patient returns to my office for at risk foot care.  This patient requires this care by a professional since this patient will be at risk due to having diabetes and pvd with BK left leg.  This patient is unable to cut nails herself since the patient cannot reach her nails.These nails are painful walking and wearing shoes.  She has painful callus on the outside bottom of her right foot.  This patient presents for at risk foot care today.  General Appearance  Alert, conversant and in no acute stress.  Vascular  Dorsalis pedis and posterior tibial  pulses are absent right foot.  Capillary return is within normal limits . Cold feet right .  Neurologic  Senn-Weinstein monofilament wire test diminished   bilaterally. Muscle power within normal limits bilaterally.  Nails Thick disfigured discolored nails with subungual debris  from hallux to fifth toes right.. No evidence of bacterial infection or drainage bilaterally.  Orthopedic  No limitations of motion  feet .  No crepitus or effusions noted.  No bony pathology or digital deformities noted.  Skin  normotropic skin right foot.  Porokeratosis sub 5 right foot..  No signs of infections or ulcers noted.     Onychomycosis  Pain in right toes  Pain in left toes.  Callus on tip of right hallux.  Consent was obtained for treatment procedures.   Mechanical debridement of nails 1-5  Right  performed with a nail nipper.  Filed with dremel without incident.   Debride callus with # 15 blade.   Return office visit   3 months                   Told patient to return for periodic foot care and evaluation due to potential at risk complications.   Gardiner Barefoot DPM

## 2020-08-03 ENCOUNTER — Other Ambulatory Visit: Payer: Self-pay | Admitting: Internal Medicine

## 2020-08-06 ENCOUNTER — Other Ambulatory Visit: Payer: Self-pay

## 2020-08-06 ENCOUNTER — Telehealth: Payer: Medicaid Other

## 2020-08-06 ENCOUNTER — Ambulatory Visit: Payer: Medicaid Other

## 2020-08-06 ENCOUNTER — Ambulatory Visit (INDEPENDENT_AMBULATORY_CARE_PROVIDER_SITE_OTHER): Payer: Medicaid Other | Admitting: Student

## 2020-08-06 ENCOUNTER — Encounter: Payer: Self-pay | Admitting: Student

## 2020-08-06 ENCOUNTER — Telehealth: Payer: Self-pay | Admitting: *Deleted

## 2020-08-06 VITALS — BP 159/77 | HR 60 | Temp 98.7°F | Ht 61.0 in | Wt 129.9 lb

## 2020-08-06 DIAGNOSIS — Z23 Encounter for immunization: Secondary | ICD-10-CM | POA: Diagnosis not present

## 2020-08-06 DIAGNOSIS — I10 Essential (primary) hypertension: Secondary | ICD-10-CM | POA: Diagnosis not present

## 2020-08-06 DIAGNOSIS — Z13820 Encounter for screening for osteoporosis: Secondary | ICD-10-CM | POA: Diagnosis not present

## 2020-08-06 DIAGNOSIS — Z0000 Encounter for general adult medical examination without abnormal findings: Secondary | ICD-10-CM | POA: Diagnosis not present

## 2020-08-06 DIAGNOSIS — L299 Pruritus, unspecified: Secondary | ICD-10-CM | POA: Diagnosis not present

## 2020-08-06 DIAGNOSIS — N184 Chronic kidney disease, stage 4 (severe): Secondary | ICD-10-CM | POA: Diagnosis not present

## 2020-08-06 DIAGNOSIS — I129 Hypertensive chronic kidney disease with stage 1 through stage 4 chronic kidney disease, or unspecified chronic kidney disease: Secondary | ICD-10-CM

## 2020-08-06 MED ORDER — HYDROXYZINE HCL 10 MG PO TABS: 10.0000 mg | ORAL_TABLET | Freq: Two times a day (BID) | ORAL | 1 refills | Status: DC | PRN

## 2020-08-06 NOTE — Progress Notes (Signed)
Internal Medicine Clinic Attending  Case discussed with Dr. Coe at the time of the visit.  We reviewed the resident's history and exam and pertinent patient test results.  I agree with the assessment, diagnosis, and plan of care documented in the resident's note.  Kester Stimpson, M.D., Ph.D.  

## 2020-08-06 NOTE — Assessment & Plan Note (Signed)
Today's Vitals   08/06/20 0849  BP: (!) 159/77  Pulse: 60  Temp: 98.7 F (37.1 C)  TempSrc: Oral  SpO2: 100%  Weight: 129 lb 14.4 oz (58.9 kg)  Height: 5\' 1"  (1.549 m)   Body mass index is 24.54 kg/m.   BP still elevated on visit. She is on entresto 24-26 BID, Jardiance 10mg  daily, and coreg 12.5 BID as per patient, but she appears to be unsure about her exact medications (she has an aide at home that fills her pill box).  -continue current anti-HTN meds -f/u in 2 months with PCP

## 2020-08-06 NOTE — Assessment & Plan Note (Signed)
Received PPSV-23 vaccination today.  Placed order for DEXA scan to screen for osteoporosis in this postmenopausal female.

## 2020-08-06 NOTE — Assessment & Plan Note (Signed)
Patient reports that she sees a nephrologist, but upon chart review no notes from nephrology. Will recheck her BMP to ensure her renal function is not deteriorating further as she is on entresto and jardiance in the setting of CKD IV.  -f/u BMP

## 2020-08-06 NOTE — Telephone Encounter (Signed)
°  Care Management   Outreach Note  08/06/2020 Name: [name] [name] MRN: [id number] DOB: 04-20-1951  Referred by: [name], MD Reason for referral : Care Coordination (NIDDM,  HTN, HLD, CAD, CKD stage 4, CHB with pacemaker, L BKA)  Initial plan was to meet with patient after her clinic appointment today but patient left clinic before this CCM RN saw her. An unsuccessful telephone outreach was attempted today to complete initial assessment. . The patient was referred to the case management team for assistance with care management and care coordination.   Follow Up Plan: A HIPAA compliant phone message was left for the patient providing contact information and requesting a return call.  The care management team will reach out to the patient again over the next 7-10 days.   [name] RN, CCM, [facility] RN Care Manager [phone]

## 2020-08-06 NOTE — Addendum Note (Signed)
Addended by: Oda Kilts on: 08/06/2020 11:31 AM   Modules accepted: Orders

## 2020-08-06 NOTE — Patient Instructions (Signed)
Ms Fellows,  It was a pleasure seeing you in the clinic today.   We have refilled your itching medication and sent it to your pharmacy.  You have received your second pneumonia vaccination today.  Lastly, I ordered a bone density scan to check your bone health. Someone will call you to schedule this with you.  Please call our clinic at [phone] if you have any questions or concerns. The best time to call is Monday-Friday from 9am-4pm, but there is someone available 24/7 at the same number. If you need medication refills, please notify your pharmacy one week in advance and they will send [place] a request.   Thank you for letting us take part in your care. We look forward to seeing you next time!

## 2020-08-06 NOTE — Assessment & Plan Note (Signed)
Patient has been complaining of itching for the past couple of months for which she was started on hydroxyzine. She is complaining of itching all over her body. No rash, tingling, or burning. She has not used any new skin products at home. Noted scratch marks on her arms bilaterally. States that hydroxyzine is the only thing that helps -refilled hydroxyzine 10mg  bid prn (reduced dose with CKD IV) -f/u in 2 months with PCP
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
## 2020-08-07 ENCOUNTER — Ambulatory Visit: Payer: Medicaid Other

## 2020-08-07 DIAGNOSIS — N184 Chronic kidney disease, stage 4 (severe): Secondary | ICD-10-CM

## 2020-08-07 DIAGNOSIS — I129 Hypertensive chronic kidney disease with stage 1 through stage 4 chronic kidney disease, or unspecified chronic kidney disease: Secondary | ICD-10-CM

## 2020-08-07 DIAGNOSIS — I509 Heart failure, unspecified: Secondary | ICD-10-CM

## 2020-08-07 DIAGNOSIS — E1122 Type 2 diabetes mellitus with diabetic chronic kidney disease: Secondary | ICD-10-CM

## 2020-08-07 NOTE — Progress Notes (Signed)
Internal Medicine Clinic Attending  I saw and evaluated the patient.  I personally confirmed the key portions of the history and exam documented by Dr. Allyson Sabal and I reviewed pertinent patient test results.  The assessment, diagnosis, and plan were formulated together and I agree with the documentation in the resident's note.  At last visit with Dr. [name], concern for incomplete diuresis. Today reports feeling well and is completely focused on hydroxyzine refill, not much interest in discussing other symptoms. Will follow up on BMP and nephrology referral as renal function remains tenuous, entresto and SGLT2i have the potential to alter her disease course for the better but require close monitoring given her risk for progressive renal function decline.   Also discussed COVID vaccination, she is still thinking about it and wants to talk to family and friends she trusts.  Lenice Pressman, M.D., Ph.D.

## 2020-08-07 NOTE — Patient Instructions (Signed)
Visit Information  Goals Addressed              This Visit's Progress   .  "I would like to have meals delivered to my home" (pt-stated)        Current Barriers: 07/24/20:  Patient referred to [facility] for medication management and assistance.  Patient denied need for medication assistance during initial SW assessment. Completed SDOH and only specific SW need identified was referral to Meals on Wheels.  07/25/20: [facility] SW, Alice Ward, reports that medication management is biggest concern for patient at this time.  She has discussed [facility] with patient but patient declined.   [name] plans to conduct home visit along with patient's PCS aide to further discuss benefit of program.     Case Manager Clinical Goal(s):  Marland Kitchen Over the next 180 days, patient will work with BSW to address needs related to  meal delivery . Over the next 180 days, BSW will collaborate with RN Care Manager to address care management and care coordination needs  Interventions:  . Received confirmation from [employer] of [facility] that patient has been added to Meals on Wheels wait list.  . Community Message sent to [employer], [place] Social Worker, to inquire if she conducted home visit and if other SW needs have been identified.   .   Patient Self Care Activities:  . Attends all scheduled provider appointments . Calls provider office for new concerns or questions . Patient unable to independently manage medications . Patient unable to independently manage chronic medical conditions  Please see past updates related to this goal by clicking on the "Past Updates" button in the selected goal      .  COMPLETED: Patient told CCM BSW on 07/24/20, ' I wold like meals delivered to my home. (pt-stated)        [place] (see longtitudinal plan of care for additional care plan information)   Current Barriers:  . Chronic Disease Management support, education, and care coordination  needs related to CAD, HTN, HLD, DMII, and CKD Stage 4  Case Manager Clinical Goal(s):  Marland Kitchen Over the next 30-60 days, patient will work with BSW to address needs related to  meals delivered to home  in patient with CAD, HTN, HLD, DMII, and CKD Stage 4  Interventions:  . Received confirmation from [employer] of [facility] that patient has been added to [employer] Whe[employer] wait list.   Patient Self Care Activities:  . Patient verbalizes understanding of plan to work with CCM team to assist with meal delivery to home  Please see past updates related to this goal by clicking on the "Past Updates" button in the selected goal      .  Per [facility] staff, Patient needs assistance with medication management" (pt-stated)        [place] (see longitudinal plan of care for additional care plan information)  Current Barriers:  . RNCM and CCM BSW received the following community message from Reche Dixon with [facility].  "I wanted to touch base with you all on [facility].  She has been in our acute program for Heart Failure and is now finishing the transitions of care portion on friday.  We cannot continue any further with her since she does not appear to be [facility]N.  Please let me know if I am not right on that!  The patient is going to struggle with medication management.  We tried blister packs and that did not work.  She feels comfortable with holding the pill bottle.  She has her pill bottles arranged for 3 times a day!  We tried [facility] but she refused.   I know that Luetta Nutting has touched base with Danton Clap our Education officer, museum about a few things.    We will send our note at the end of the week.  Let me know if you would like to talk to someone about her before we discharge or if you just want the note.  Or if she is [facility] and we do not know it! "  Clinical Goal(s):  Marland Kitchen Over the next 180 days, patient will work with [facility] to address needs related to medication  management  Interventions: . Inter-disciplinary care team collaboration (see longitudinal plan of care) . Informed [name] that [facility], [name] attempted to meet with patient during recent [facility] visit, however, patient left clinic too quickly  . [name] informed that RNCM attempted to contact patient via phone but outreach was unsuccessful . Ensured that [facility] staff have discussed [facility] with patient; staff reports that patient declined this service  Patient Self Care Activities:  .  Marland Kitchen Unable to self administer medications as prescribed  Initial goal documentation          CCM BSW awaiting response from [facility] SW, [facility], regarding additional support needed. RNCM second attempt to complete initial RN assessment is scheduled for 08/13/20.    Ronn Melena, [place] Coordination Social Worker [place] [phone]

## 2020-08-07 NOTE — Progress Notes (Signed)
Internal Medicine Clinic Resident ° °I have personally reviewed this encounter including the documentation in this note and/or discussed this patient with the care management provider. I will address any urgent items identified by the care management provider and will communicate my actions to the patient's PCP. I have reviewed the patient's CCM visit with my supervising attending, Dr Raines. ° °Jetaime Pinnix, MD °08/07/2020 ° ° ° °

## 2020-08-07 NOTE — Chronic Care Management (AMB) (Signed)
Care Management   Follow Up Note   08/07/2020 Name: [name] [name] MRN: [id number] DOB: 10-18-1950  Referred by: [name], MD Reason for referral : [place] is a 69 y.o. year old female who is a primary care patient of [name], MD. The care management team was consulted for assistance with care management and care coordination needs.    Review of patient status, including review of consultants reports, relevant laboratory and other test results, and collaboration with appropriate care team members and the patient's provider was performed as part of comprehensive patient evaluation and provision of chronic care management services.    SDOH (Social Determinants of Health) assessments performed: No See Care Plan activities for detailed interventions related to [facility])     Advanced Directives: See Care Plan and Vynca application for related entries.   Goals Addressed              This Visit's Progress   .  "I would like to have meals delivered to my home" (pt-stated)        Current Barriers: 07/24/20:  Patient referred to [facility] for medication management and assistance.  Patient denied need for medication assistance during initial SW assessment. Completed SDOH and only specific SW need identified was referral to Meals on Wheels.  07/25/20: [facility] SW, Alice Ward, reports that medication management is biggest concern for patient at this time.  She has discussed [facility] with patient but patient declined.   [name] plans to conduct home visit along with patient's PCS aide to further discuss benefit of program.     Case Manager Clinical Goal(s):  Marland Kitchen Over the next 180 days, patient will work with BSW to address needs related to  meal delivery . Over the next 180 days, BSW will collaborate with RN Care Manager to address care management and care coordination needs  Interventions:  . Received confirmation from [employer] of  [facility] that patient has been added to Meals [employer] Wheels wait list.  . Community Message sent to [employer], [place] Social Worker, to inquire if she conducted home visit and if other SW needs have been identified.   .   Patient Self Care Activities:  . Attends all scheduled provider appointments . Calls provider office for new concerns or questions . Patient unable to independently manage medications . Patient unable to independently manage chronic medical conditions  Please see past updates related to this goal by clicking on the "Past Updates" button in the selected goal      .  COMPLETED: Patient told CCM BSW on 07/24/20, ' I wold like meals delivered to my home. (pt-stated)        [place] (see longtitudinal plan of care for additional care plan information)   Current Barriers:  . Chronic Disease Management support, education, and care coordination needs related to CAD, HTN, HLD, DMII, and CKD Stage 4  Case Manager Clinical Goal(s):  Marland Kitchen Over the next 30-60 days, patient will work with BSW to address needs related to  meals delivered to home  in patient with CAD, HTN, HLD, DMII, and CKD Stage 4  Interventions:  . Received confirmation from [employer] of [facility] that patient has been added to Meals on Wheels wait list.   Patient Self Care Activities:  . Patient verbalizes understanding of plan to work with CCM team to assist with meal delivery to home  Please see past  updates related to this goal by clicking on the "Past Updates" button in the selected goal      .  Per [facility] staff, Patient needs assistance with medication management" (pt-stated)        [place] (see longitudinal plan of care for additional care plan information)  Current Barriers:  . RNCM and CCM BSW received the following community message from Reche Dixon with [facility].  "I wanted to touch base with you all on [facility].  She has been in our acute program  for Heart Failure and is now finishing the transitions of care portion on friday.  We cannot continue any further with her since she does not appear to be TH[facility].  Please let me know if I am not right on that!  The patient is going to struggle with medication management.  We tried blister packs and that did not work.  She feels comfortable with holding the pill bottle.  She has her pill bottles arranged for 3 times a day!  We tried [facility] but she refused.   I know that Luetta Nutting has touched base with Danton Clap our Education officer, museum about a few things.    We will send our note at the end of the week.  Let me know if you would like to talk to someone about her before we discharge or if you just want the note.  Or if she is [facility] and we do not know it! "  Clinical Goal(s):  Marland Kitchen Over the next 180 days, patient will work with [facility] to address needs related to medication management  Interventions: . Inter-disciplinary care team collaboration (see longitudinal plan of care) . Informed [name] that [facility], [name] attempted to meet with patient during recent [facility] visit, however, patient left clinic too quickly  . [name] informed that RNCM attempted to contact patient via phone but outreach was unsuccessful . Ensured that [facility] staff have discussed [facility] with patient; staff reports that patient declined this service  Patient Self Care Activities:  .  Marland Kitchen Unable to self administer medications as prescribed  Initial goal documentation         CCM BSW awaiting response from [facility] SW, [facility], regarding additional support needed. RNCM second attempt to complete initial RN assessment is scheduled for 08/13/20.    Ronn Melena, [place] Coordination Social Worker [place] [phone]

## 2020-08-08 NOTE — Progress Notes (Signed)
Internal Medicine Clinic Attending  CCM services provided by the care management provider and their documentation were reviewed with Dr. Masoudi.  We reviewed the pertinent findings, urgent action items addressed by the resident and non-urgent items to be addressed by the PCP.  I agree with the assessment, diagnosis, and plan of care documented in the CCM and resident's note.  Myonna Chisom N Carman Essick, MD 08/08/2020  

## 2020-08-13 ENCOUNTER — Other Ambulatory Visit: Payer: Self-pay | Admitting: Internal Medicine

## 2020-08-13 ENCOUNTER — Ambulatory Visit: Payer: Medicaid Other | Admitting: *Deleted

## 2020-08-13 DIAGNOSIS — E1122 Type 2 diabetes mellitus with diabetic chronic kidney disease: Secondary | ICD-10-CM

## 2020-08-13 DIAGNOSIS — I10 Essential (primary) hypertension: Secondary | ICD-10-CM

## 2020-08-13 DIAGNOSIS — N184 Chronic kidney disease, stage 4 (severe): Secondary | ICD-10-CM

## 2020-08-13 DIAGNOSIS — I129 Hypertensive chronic kidney disease with stage 1 through stage 4 chronic kidney disease, or unspecified chronic kidney disease: Secondary | ICD-10-CM

## 2020-08-13 DIAGNOSIS — Z794 Long term (current) use of insulin: Secondary | ICD-10-CM

## 2020-08-13 DIAGNOSIS — I509 Heart failure, unspecified: Secondary | ICD-10-CM

## 2020-08-13 NOTE — Chronic Care Management (AMB) (Signed)
Care Management   Follow Up Note   08/13/2020 Name: [name] [name] MRN: [id number] DOB: [birth year]  [name] [name] is enrolled in a Managed Medicaid plan: Yes. Outreach attempt today was successful.    Referred by: [name] Payment, MD Reason for referral : Care Coordination (NIDDM,  HTN, HLD, CAD, CKD stage 4, CHB with pacemaker, L BKA)   [name] [name] is a 69 y.o. year old female who is a primary care patient of [name] Payment, MD. The care management team was consulted for assistance with care management and care coordination needs.    Review of patient status, including review of consultants reports, relevant laboratory and other test results, and collaboration with appropriate care team members and the patient's provider was performed as part of comprehensive patient evaluation and provision of chronic care management services.    Goals Addressed              This Visit's Progress     Patient Stated   .  " I don't know anything about an appointment with the heart failure clinic." (pt-stated)        CARE PLAN ENTRY (see longitudinal plan of care for additional care plan information)  Current Barriers:  . Care Coordination needs related to arranging transportation to initial heart failure clinic appointment in a patient with NIDDM,  HTN, HLD, CAD, CKD stage 4, CHB with pacemaker, L BKA  Nurse Case Manager Clinical Goal(s):  Marland Kitchen By end of day today, patient will verbalize understanding of plan to arrange transportation to heart failure clinic appointment on 08/16/20 at 10:30 am.   Interventions:  . Inter-disciplinary care team collaboration (see longitudinal plan of care) . Advised patient of initial appointment with the heart failure clinic on 08/16/20 at 10:30 am with [name] [name]. Also gave patient the address and phone number for the clinic. Asked patient to call Medicaid transportation today to arrange for transportation. . Called patient back to verify that  she arranged Medicaid transportation for the appropriate date and time and location.   Patient Self Care Activities:  . Patient verbalizes understanding of plan to arrange Medicaid transportation for heart failure clinic appointment.  . Unable to independently arrange Medicaid transportation for heart failure clinic appointment.   . Unable to self administer medications as prescribed  Initial goal documentation     .  Per [facility] staff, Patient needs assistance with medication management" (pt-stated)        CARE PLAN ENTRY (see longitudinal plan of care for additional care plan information)  Current Barriers:  . RNCM and CCM BSW received the following community message from [name] [name] with [facility].  "I wanted to touch base with you all on [facility].  She has been in our acute program for Heart Failure and is now finishing the transitions of care portion on friday.  We cannot continue any further with her since she does not appear to be [facility].  Please let me know if I am not right on that!  The patient is going to struggle with medication management.  We tried blister packs and that did not work.  She feels comfortable with holding the pill bottle.  She has her pill bottles arranged for 3 times a day!  We tried [facility] but she refused.   I know that [name] [name] has touched base with [name] [name] our Education officer, museum about a few things.    We will send our note at the end of the week.  Let me know if you would like to talk to someone about her before we discharge or if you just want the note.  Or if she is [facility] and we do not know it! " 08/13/20 Spoke at length with patient. As the [facility] staff reported, patient is adamant that she does not want her medications in pill packs but rather in bottles. She was also not aware that she had an appointment to establish care with the heart failure clinic on 08/16/20 at 10:30 am. She was under the impression [facility] would continue to see her however  per [facility] communication, they stopped services on 08/10/20.  Clinical Goal(s):  Marland Kitchen Over the next 180 days, patient will work with [facility] to address needs related to medication management  Interventions: . Inter-disciplinary care team collaboration (see longitudinal plan of care) . Informed [name] that [facility], [name] [name] attempted to meet with patient during recent [facility] visit, however, patient left clinic too quickly  . [name] informed that RNCM attempted to contact patient via phone but outreach was unsuccessful . Ensured that [facility] staff have discussed [facility] with patient; staff reports that patient declined this service . 08/13/20 Spoke with patient via phone to discuss medication taking behavior. With patient's permission, also spoke with patient's PCS aide [name] [name] and she confirms that patient is not taking her medications as prescribed because she only wants to take them from the bottles and will not use the pill packs. Patient agrees to meet with this RNCM after her heart failure clinic appointment on 08/16/20 to address medication taking behavior barriers.    Patient Self Care Activities:   . Unable to self administer medications as prescribed  Please see past updates related to this goal by clicking on the "Past Updates" button in the selected goal        Other   .  Blood Pressure < 140/90        BP Readings from Last 3 Encounters:  08/06/20 (!) 159/77  07/17/20 136/82  07/13/20 (!) 150/62   Not meeting blood pressure targets    .  HEMOGLOBIN A1C < 7        Lab Results  Component Value Date   HGBA1C 5.4 03/18/2018       .  LDL CALC < 100        Lab Results  Component Value Date   CHOL 175 10/19/2013   HDL 70 10/19/2013   LDLCALC 89 10/19/2013   TRIG 79 10/19/2013   CHOLHDL 2.5 10/19/2013           The care management team will reach out to the patient again over the next 7 days.  [name] Churn RN, CCM, [facility] RN Care  Manager [phone]

## 2020-08-13 NOTE — Patient Instructions (Signed)
Visit Information It was nice speaking with you today. Goals Addressed              This Visit's Progress     Patient Stated   .  " I don't know anything about an appointment with the heart failure clinic." (pt-stated)        CARE PLAN ENTRY (see longitudinal plan of care for additional care plan information)  Current Barriers:  . Care Coordination needs related to arranging transportation to initial heart failure clinic appointment in a patient with NIDDM,  HTN, HLD, CAD, CKD stage 4, CHB with pacemaker, L BKA  Nurse Case Manager Clinical Goal(s):  Marland Kitchen By end of day today, patient will verbalize understanding of plan to arrange transportation to heart failure clinic appointment on 08/16/20 at 10:30 am.   Interventions:  . Inter-disciplinary care team collaboration (see longitudinal plan of care) . Advised patient of initial appointment with the heart failure clinic on 08/16/20 at 10:30 am with Dr Haroldine Laws. Also gave patient the address and phone number for the clinic. Asked patient to call Medicaid transportation today to arrange for transportation. . Called patient back to verify that she arranged Medicaid transportation for the appropriate date and time and location.   Patient Self Care Activities:  . Patient verbalizes understanding of plan to arrange Medicaid transportation for heart failure clinic appointment.  . Unable to independently arrange Medicaid transportation for heart failure clinic appointment.   . Unable to self administer medications as prescribed  Initial goal documentation     .  Per [facility] staff, Patient needs assistance with medication management" (pt-stated)        CARE PLAN ENTRY (see longitudinal plan of care for additional care plan information)  Current Barriers:  . RNCM and CCM BSW received the following community message from Reche Dixon with [facility].  "I wanted to touch base with you all on [facility].  She has been in our acute  program for Heart Failure and is now finishing the transitions of care portion on friday.  We cannot continue any further with her since she does not appear to be [facility].  Please let me know if I am not right on that!  The patient is going to struggle with medication management.  We tried blister packs and that did not work.  She feels comfortable with holding the pill bottle.  She has her pill bottles arranged for 3 times a day!  We tried [facility] but she refused.   I know that Luetta Nutting has touched base with Danton Clap our Education officer, museum about a few things.    We will send our note at the end of the week.  Let me know if you would like to talk to someone about her before we discharge or if you just want the note.  Or if she is [facility] and we do not know it! " 08/13/20 Spoke at length with patient. As the [facility] staff reported, patient is adamant that she does not want her medications in pill packs but rather in bottles. She was also not aware that she had an appointment to establish care with the heart failure clinic on 08/16/20 at 10:30 am. She was under the impression [facility] would continue to see her however per [facility] communication, they stopped services on 08/10/20.  Clinical Goal(s):  Marland Kitchen Over the next 180 days, patient will work with [facility] to address needs related to medication management  Interventions: . Inter-disciplinary care team  collaboration (see longitudinal plan of care) . Informed [name] that [facility], [name] attempted to meet with patient during recent [facility] visit, however, patient left clinic too quickly  . [name] informed that RNCM attempted to contact patient via phone but outreach was unsuccessful . Ensured that [facility] staff have discussed [facility] with patient; staff reports that patient declined this service . 08/13/20 Spoke with patient via phone to discuss medication taking behavior. With patient's permission, also spoke with patient's PCS aide Seth Bake  and she confirms that patient is not taking her medications as prescribed because she only wants to take them from the bottles and will not use the pill packs. Patient agrees to meet with this [facility] after her heart failure clinic appointment on 08/16/20. To address medication taking behavior barriers.    Patient Self Care Activities:   . Unable to self administer medications as prescribed  Please see past updates related to this goal by clicking on the "Past Updates" button in the selected goal        Other   .  Blood Pressure < 140/90        BP Readings from Last 3 Encounters:  08/06/20 (!) 159/77  07/17/20 136/82  07/13/20 (!) 150/62   Not meeting blood pressure targets    .  HEMOGLOBIN A1C < 7        Lab Results  Component Value Date   HGBA1C 5.4 03/18/2018       .  LDL CALC < 100        Lab Results  Component Value Date   CHOL 175 10/19/2013   HDL 70 10/19/2013   LDLCALC 89 10/19/2013   TRIG 79 10/19/2013   CHOLHDL 2.5 10/19/2013          The patient verbalized understanding of instructions, educational materials, and care plan provided today and agreed to receive a mailed copy of patient instructions, educational materials, and care plan.   Face to Face appointment with care management team member scheduled for: 08/16/20  [name] RN, CCM, [facility] RN Care Manager [phone]

## 2020-08-15 ENCOUNTER — Other Ambulatory Visit: Payer: Self-pay

## 2020-08-15 DIAGNOSIS — K529 Noninfective gastroenteritis and colitis, unspecified: Secondary | ICD-10-CM

## 2020-08-15 NOTE — Telephone Encounter (Signed)
loperamide (IMODIUM A-D) 2 MG tablet, refill request @  [place], [facility] Phone:  [phone]  Fax:  [phone]     Pt would like a call back.

## 2020-08-15 NOTE — Progress Notes (Signed)
Internal Medicine Clinic Resident  I have personally reviewed this encounter including the documentation in this note and/or discussed this patient with the care management provider. I will address any urgent items identified by the care management provider and will communicate my actions to the patient's PCP. I have reviewed the patient's CCM visit with my supervising attending, Dr Vincent.  [name], MD 08/15/2020  

## 2020-08-15 NOTE — Progress Notes (Signed)
Internal Medicine Clinic Attending  CCM services provided by the care management provider and their documentation were discussed with Dr. Basaraba. We reviewed the pertinent findings, urgent action items addressed by the resident and non-urgent items to be addressed by the PCP.  I agree with the assessment, diagnosis, and plan of care documented in the CCM and resident's note.  Itzamara Casas Thomas Merrilee Ancona, MD 08/15/2020  

## 2020-08-15 NOTE — Telephone Encounter (Signed)
Please call pt back about getting a refill on Imodium.

## 2020-08-16 ENCOUNTER — Ambulatory Visit: Payer: Medicaid Other | Admitting: *Deleted

## 2020-08-16 ENCOUNTER — Encounter (HOSPITAL_COMMUNITY): Payer: Self-pay | Admitting: Internal Medicine

## 2020-08-16 ENCOUNTER — Other Ambulatory Visit: Payer: Self-pay | Admitting: Internal Medicine

## 2020-08-16 ENCOUNTER — Other Ambulatory Visit: Payer: Self-pay

## 2020-08-16 ENCOUNTER — Ambulatory Visit (HOSPITAL_COMMUNITY)
Admission: RE | Admit: 2020-08-16 | Discharge: 2020-08-16 | Disposition: A | Discharge status: Home / Self Care | Payer: Medicaid Other | Source: Ambulatory Visit | Attending: Internal Medicine | Admitting: Internal Medicine

## 2020-08-16 VITALS — BP 118/64 | HR 60 | Wt 120.2 lb

## 2020-08-16 DIAGNOSIS — Z8674 Personal history of sudden cardiac arrest: Secondary | ICD-10-CM | POA: Insufficient documentation

## 2020-08-16 DIAGNOSIS — N184 Chronic kidney disease, stage 4 (severe): Secondary | ICD-10-CM | POA: Insufficient documentation

## 2020-08-16 DIAGNOSIS — I480 Paroxysmal atrial fibrillation: Secondary | ICD-10-CM | POA: Diagnosis not present

## 2020-08-16 DIAGNOSIS — Z833 Family history of diabetes mellitus: Secondary | ICD-10-CM | POA: Diagnosis not present

## 2020-08-16 DIAGNOSIS — Z79899 Other long term (current) drug therapy: Secondary | ICD-10-CM | POA: Insufficient documentation

## 2020-08-16 DIAGNOSIS — Z880 Allergy status to penicillin: Secondary | ICD-10-CM | POA: Diagnosis not present

## 2020-08-16 DIAGNOSIS — Z7901 Long term (current) use of anticoagulants: Secondary | ICD-10-CM | POA: Diagnosis not present

## 2020-08-16 DIAGNOSIS — I509 Heart failure, unspecified: Secondary | ICD-10-CM

## 2020-08-16 DIAGNOSIS — I5022 Chronic systolic (congestive) heart failure: Secondary | ICD-10-CM | POA: Diagnosis not present

## 2020-08-16 DIAGNOSIS — Z791 Long term (current) use of non-steroidal anti-inflammatories (NSAID): Secondary | ICD-10-CM | POA: Insufficient documentation

## 2020-08-16 DIAGNOSIS — Z794 Long term (current) use of insulin: Secondary | ICD-10-CM

## 2020-08-16 DIAGNOSIS — I442 Atrioventricular block, complete: Secondary | ICD-10-CM | POA: Diagnosis not present

## 2020-08-16 DIAGNOSIS — E1122 Type 2 diabetes mellitus with diabetic chronic kidney disease: Secondary | ICD-10-CM

## 2020-08-16 DIAGNOSIS — F17210 Nicotine dependence, cigarettes, uncomplicated: Secondary | ICD-10-CM | POA: Insufficient documentation

## 2020-08-16 DIAGNOSIS — Z89512 Acquired absence of left leg below knee: Secondary | ICD-10-CM | POA: Diagnosis not present

## 2020-08-16 DIAGNOSIS — I130 Hypertensive heart and chronic kidney disease with heart failure and stage 1 through stage 4 chronic kidney disease, or unspecified chronic kidney disease: Secondary | ICD-10-CM | POA: Insufficient documentation

## 2020-08-16 DIAGNOSIS — R079 Chest pain, unspecified: Secondary | ICD-10-CM | POA: Diagnosis not present

## 2020-08-16 DIAGNOSIS — Z720 Tobacco use: Secondary | ICD-10-CM

## 2020-08-16 DIAGNOSIS — Z950 Presence of cardiac pacemaker: Secondary | ICD-10-CM | POA: Diagnosis not present

## 2020-08-16 DIAGNOSIS — E1151 Type 2 diabetes mellitus with diabetic peripheral angiopathy without gangrene: Secondary | ICD-10-CM | POA: Diagnosis not present

## 2020-08-16 DIAGNOSIS — I10 Essential (primary) hypertension: Secondary | ICD-10-CM

## 2020-08-16 DIAGNOSIS — Z8249 Family history of ischemic heart disease and other diseases of the circulatory system: Secondary | ICD-10-CM | POA: Insufficient documentation

## 2020-08-16 DIAGNOSIS — K529 Noninfective gastroenteritis and colitis, unspecified: Secondary | ICD-10-CM

## 2020-08-16 DIAGNOSIS — Z7984 Long term (current) use of oral hypoglycemic drugs: Secondary | ICD-10-CM | POA: Insufficient documentation

## 2020-08-16 DIAGNOSIS — I129 Hypertensive chronic kidney disease with stage 1 through stage 4 chronic kidney disease, or unspecified chronic kidney disease: Secondary | ICD-10-CM

## 2020-08-16 DIAGNOSIS — I2510 Atherosclerotic heart disease of native coronary artery without angina pectoris: Secondary | ICD-10-CM | POA: Insufficient documentation

## 2020-08-16 MED ORDER — FUROSEMIDE 80 MG PO TABS: 80.0000 mg | ORAL_TABLET | Freq: Every day | ORAL | 3 refills | Status: DC | PRN

## 2020-08-16 MED ORDER — LOPERAMIDE HCL 2 MG PO TABS: 2.0000 mg | ORAL_TABLET | Freq: Four times a day (QID) | ORAL | 0 refills | Status: DC | PRN

## 2020-08-16 NOTE — Addendum Note (Signed)
Encounter addended by: Jolaine Artist, MD on: 08/16/2020 12:23 PM  Actions taken: Level of Service modified, Visit diagnoses modified, Charge Capture section accepted
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
## 2020-08-16 NOTE — Patient Instructions (Signed)
Visit Information It was nice meeting with you today. Goals Addressed              This Visit's Progress     Patient Stated   .  COMPLETED: " I don't know anything about an appointment with the heart failure clinic." (pt-stated)        CARE PLAN ENTRY (see longitudinal plan of care for additional care plan information)  Current Barriers:  . Care Coordination needs related to arranging transportation to initial heart failure clinic appointment in a patient with NIDDM,  HTN, HLD, CAD, CKD stage 4, CHB with pacemaker, L BKA- patient says Dr Haroldine Laws said her heart is failing and that it is important to take Lasix 80 mg when she starts retaining fluid   Nurse Case Manager Clinical Goal(s):  Marland Kitchen By end of day today, patient will verbalize understanding of plan to arrange transportation to heart failure clinic appointment on 08/16/20 at 10:30 am.   Interventions:  . Inter-disciplinary care team collaboration (see longitudinal plan of care) . Advised patient of initial appointment with the heart failure clinic on 08/16/20 at 10:30 am with Dr Haroldine Laws. Also gave patient the address and phone number for the clinic. Asked patient to call Medicaid transportation today to arrange for transportation. . Called patient back to verify that she arranged Medicaid transportation for the appropriate date and time and location.  . 08/16/20- Verified that patient completed initial appointment with the [facility] . Reviewed Heart Failure Action Plan omitting daily weights since patient cannot weigh herself due to L BKA and risk of falls if she attempts to weigh unassisted  Patient Self Care Activities:  . Patient verbalizes understanding of plan to arrange Medicaid transportation for heart failure clinic appointment.  . Unable to independently arrange Medicaid transportation for heart failure clinic appointment.   . Unable to self administer medications as prescribed  Please see past  updates related to this goal by clicking on the "Past Updates" button in the selected goal      .  Per [facility] staff, Patient needs assistance with medication management" (pt-stated)        CARE PLAN ENTRY (see longitudinal plan of care for additional care plan information)  Current Barriers:  . RNCM and CCM BSW received the following community message from Reche Dixon with [facility].  "I wanted to touch base with you all on [facility].  She has been in our acute program for Heart Failure and is now finishing the transitions of care portion on friday.  We cannot continue any further with her since she does not appear to be [facility].  Please let me know if I am not right on that!  The patient is going to struggle with medication management.  We tried blister packs and that did not work.  She feels comfortable with holding the pill bottle.  She has her pill bottles arranged for 3 times a day!  We tried [facility] but she refused.   I know that Luetta Nutting has touched base with Danton Clap our Education officer, museum about a few things.    We will send our note at the end of the week.  Let me know if you would like to talk to someone about her before we discharge or if you just want the note.  Or if she is [facility] and we do not know it! " 08/13/20 Spoke at length with patient. As the [facility] staff reported, patient is adamant that she  does not want her medications in pill packs but rather in bottles. She was also not aware that she had an appointment to establish care with the heart failure clinic on 08/16/20 at 10:30 am. She was under the impression [facility] would continue to see her however per [facility] communication, they stopped services on 08/10/20. . 08/16/20- met with patient in clinic after she completed her initial appointment at the [facility] to review medication and discuss medication taking behavior. Patient states " I am old and I will only take medications that are  in a bottle, I don't want the dose packs and they keep sending my medicines in those packs. Patient says she cannot weigh w/o assistance due to her Left BKA. Spoke with one of paitent's PCS aides Seth Bake via phone, advised they are not allowed to administer medications or assist with medication administration   Clinical Goal(s):  Marland Kitchen Over the next 180 days, patient will work with [facility] to address needs related to medication management  Interventions: . Inter-disciplinary care team collaboration (see longitudinal plan of care) . Informed [name] that [facility], [name] attempted to meet with patient during recent [facility] visit, however, patient left clinic too quickly  . [name] informed that RNCM attempted to contact patient via phone but outreach was unsuccessful . Ensured that [facility] staff have discussed [facility] with patient; staff reports that patient declined this service . 08/13/20 Spoke with patient via phone to discuss medication taking behavior. With patient's permission, also spoke with patient's PCS aide Seth Bake and she confirms that patient is not taking her medications as prescribed because she only wants to take them from the bottles and will not use the pill packs. Patient agrees to meet with this [facility] after her heart failure clinic appointment on 08/16/20. To address medication taking behavior barriers.   . Medication reconciliation completed with patient . Reinforced that she is not taking Bumex, Jardiance and sodium bicarb Lasix, and Protonix because they were likely sent to her in dose packs. Despite encouragement, she refuses to use dose packs . Long discussion about the importance of taking diuretics to prevent fluid accumulation and heart failure . Since patient cannot weigh daily due to safety issues, reviewed symptoms of fluid accumulation, encouraged to assess her right foot and leg daily for swelling and need to take prn Lasix if swelling observed, dyspnea or  orthopnea . 08/16/20- Gave patient this CCM RN's business card and encouarged patient to call for questions or concerns regarding chronic disease management  Patient Self Care Activities:   . Unable to self administer medications as prescribed  Please see past updates related to this goal by clicking on the "Past Updates" button in the selected goal         The patient verbalized understanding of instructions, educational materials, and care plan provided today and declined offer to receive copy of patient instructions, educational materials, and care plan.   The care management team will reach out to the patient again over the next 30 days.   [name] RN, CCM, [facility] RN Care Manager [phone]

## 2020-08-16 NOTE — Progress Notes (Signed)
ReDS Vest / Clip - 08/16/20 1000      ReDS Vest / Clip   Station Marker A    Ruler Value 24    ReDS Value Range Moderate volume overload    ReDS Actual Value 37

## 2020-08-16 NOTE — Chronic Care Management (AMB) (Signed)
Care Management   Follow Up Note   08/16/2020 Name: Tiyonna Sardinha MRN: [id number] DOB: January 02, 1951  [place] is enrolled in a Managed Medicaid plan: Yes. Outreach attempt today was successful.    Referred by: [name], MD Reason for referral : Care Coordination ( NIDDM,  HTN, HLD, CAD, CKD stage 4, CHB with pacemaker, L BKA)   Mykiah Diane Uriegas is a 69 y.o. year old female who is a primary care patient of [name], MD. The care management team was consulted for assistance with care management and care coordination needs.    Review of patient status, including review of consultants reports, relevant laboratory and other test results, and collaboration with appropriate care team members and the patient's provider was performed as part of comprehensive patient evaluation and provision of chronic care management services.    Outpatient Encounter Medications as of 08/16/2020  Medication Sig  . albuterol (PROAIR HFA) 108 (90 Base) MCG/ACT inhaler Inhale 2 puffs into the lungs every 6 (six) hours as needed for wheezing or shortness of breath (cough).  Marland Kitchen atorvastatin (LIPITOR) 40 MG tablet Take 1 tablet (40 mg total) by mouth daily.  Marland Kitchen BIDIL 20-37.5 MG tablet Take 1 tablet by mouth 3 (three) times daily.  . carvedilol (COREG) 12.5 MG tablet Take 1 tablet (12.5 mg total) by mouth 2 (two) times daily with a meal.  . gabapentin (NEURONTIN) 300 MG capsule Take 1 capsule (300 mg total) by mouth 2 (two) times daily.  . potassium chloride (KLOR-CON) 10 MEQ tablet Take 10 mEq by mouth daily.  . Rivaroxaban (XARELTO) 15 MG TABS tablet Take 1 tablet (15 mg total) by mouth daily with supper.  . TRAVATAN Z 0.004 % SOLN ophthalmic solution Place 1 drop into both eyes at bedtime.  . valsartan (DIOVAN) 80 MG tablet Take 160 mg by mouth daily.  . bumetanide (BUMEX) 1 MG tablet Take 1 tablet (1 mg total) by mouth 2 (two) times daily. (Patient not taking: Reported on 08/16/2020)  .  calcitRIOL (ROCALTROL) 0.25 MCG capsule Take 0.25 mcg by mouth daily. (Patient not taking: Reported on 08/16/2020)  . diclofenac Sodium (VOLTAREN) 1 % GEL Apply 2 g topically 4 (four) times daily. (Patient not taking: Reported on 08/16/2020)  . diphenoxylate-atropine (LOMOTIL) 2.5-0.025 MG/5ML liquid Take 5 mLs by mouth 4 (four) times daily as needed for diarrhea or loose stools. (Patient not taking: Reported on 08/16/2020)  . empagliflozin (JARDIANCE) 10 MG TABS tablet Take 1 tablet (10 mg total) by mouth daily before breakfast. (Patient not taking: Reported on 08/16/2020)  . furosemide (LASIX) 80 MG tablet Take 1 tablet (80 mg total) by mouth daily as needed. (Patient not taking: Reported on 08/16/2020)  . hydrOXYzine (ATARAX/VISTARIL) 10 MG tablet Take 1 tablet (10 mg total) by mouth 2 (two) times daily as needed for itching. (Patient not taking: Reported on 08/16/2020)  . loperamide (IMODIUM A-D) 2 MG tablet Take 1 tablet (2 mg total) by mouth 4 (four) times daily as needed for diarrhea or loose stools. (Patient not taking: Reported on 08/16/2020)  . pantoprazole (PROTONIX) 40 MG tablet Take 1 tablet (40 mg total) by mouth daily. (Patient not taking: Reported on 08/16/2020)  . sodium bicarbonate 650 MG tablet Take 650 mg by mouth 3 (three) times daily. (Patient not taking: Reported on 08/16/2020)   No facility-administered encounter medications on file as of 08/16/2020.    Goals Addressed  This Visit's Progress     Patient Stated   .  COMPLETED: " I don't know anything about an appointment with the heart failure clinic." (pt-stated)        CARE PLAN ENTRY (see longitudinal plan of care for additional care plan information)  Current Barriers:  . Care Coordination needs related to arranging transportation to initial heart failure clinic appointment in a patient with NIDDM,  HTN, HLD, CAD, CKD stage 4, CHB with pacemaker, L BKA- patient says Dr Haroldine Laws said her heart is  failing and that it is important to take Lasix 80 mg when she starts retaining fluid   Nurse Case Manager Clinical Goal(s):  Marland Kitchen By end of day today, patient will verbalize understanding of plan to arrange transportation to heart failure clinic appointment on 08/16/20 at 10:30 am.   Interventions:  . Inter-disciplinary care team collaboration (see longitudinal plan of care) . Advised patient of initial appointment with the heart failure clinic on 08/16/20 at 10:30 am with Dr Haroldine Laws. Also gave patient the address and phone number for the clinic. Asked patient to call Medicaid transportation today to arrange for transportation. . Called patient back to verify that she arranged Medicaid transportation for the appropriate date and time and location.  . 08/16/20- Verified that patient completed initial appointment with the [facility] . Reviewed Heart Failure Action Plan omitting daily weights since patient cannot weigh herself due to L BKA and risk of falls if she attempts to weigh unassisted  Patient Self Care Activities:  . Patient verbalizes understanding of plan to arrange Medicaid transportation for heart failure clinic appointment.  . Unable to independently arrange Medicaid transportation for heart failure clinic appointment.   . Unable to self administer medications as prescribed  Please see past updates related to this goal by clicking on the "Past Updates" button in the selected goal      .  Per [facility] staff, Patient needs assistance with medication management" (pt-stated)        CARE PLAN ENTRY (see longitudinal plan of care for additional care plan information)  Current Barriers:  . RNCM and CCM BSW received the following community message from Reche Dixon with [facility].  "I wanted to touch base with you all on [facility].  She has been in our acute program for Heart Failure and is now finishing the transitions of care portion on friday.  We cannot  continue any further with her since she does not appear to be [facility].  Please let me know if I am not right on that!  The patient is going to struggle with medication management.  We tried blister packs and that did not work.  She feels comfortable with holding the pill bottle.  She has her pill bottles arranged for 3 times a day!  We tried [facility] but she refused.   I know that Luetta Nutting has touched base with Danton Clap our Education officer, museum about a few things.    We will send our note at the end of the week.  Let me know if you would like to talk to someone about her before we discharge or if you just want the note.  Or if she is [facility] and we do not know it! " 08/13/20 Spoke at length with patient. As the [facility] staff reported, patient is adamant that she does not want her medications in pill packs but rather in bottles. She was also not aware that she had an appointment to  establish care with the heart failure clinic on 08/16/20 at 10:30 am. She was under the impression [facility] would continue to see her however per [facility] communication, they stopped services on 08/10/20. . 08/16/20- met with patient in clinic after she completed her initial appointment at the [facility] to review medication and discuss medication taking behavior. Patient states " I am old and I will only take medications that are in a bottle, I don't want the dose packs and they keep sending my medicines in those packs. Patient says she cannot weigh w/o assistance due to her Left BKA. Spoke with one of paitent's PCS aides Seth Bake via phone, advised they are not allowed to administer medications or assist with medication administration   Clinical Goal(s):  Marland Kitchen Over the next 180 days, patient will work with [facility] to address needs related to medication management  Interventions: . Inter-disciplinary care team collaboration (see longitudinal plan of care) . Informed [name] that [facility], [name]  attempted to meet with patient during recent [facility] visit, however, patient left clinic too quickly  . [name] informed that RNCM attempted to contact patient via phone but outreach was unsuccessful . Ensured that [facility] staff have discussed [facility] with patient; staff reports that patient declined this service . 08/13/20 Spoke with patient via phone to discuss medication taking behavior. With patient's permission, also spoke with patient's PCS aide Seth Bake and she confirms that patient is not taking her medications as prescribed because she only wants to take them from the bottles and will not use the pill packs. Patient agrees to meet with this [facility] after her heart failure clinic appointment on 08/16/20. To address medication taking behavior barriers.   . 11/18 Medication reconciliation completed with patient . 11/18 Reinforced that she is not taking Bumex, Jardiance and sodium bicarb Lasix, and Protonix because they were likely sent to her in dose packs. Despite encouragement, she refuses to use dose packs . 11/18 Long discussion about the importance of taking diuretics to prevent fluid accumulation and heart failure . 11/18 Since patient cannot weigh daily due to safety issues, reviewed symptoms of fluid accumulation, encouraged to assess her right foot and leg daily for swelling and need to take prn Lasix if swelling observed, dyspnea or orthopnea . 08/16/20- Gave patient this CCM RN's business card and encouarged patient to call for questions or concerns regarding chronic disease management  Patient Self Care Activities:   . Unable to self administer medications as prescribed  Please see past updates related to this goal by clicking on the "Past Updates" button in the selected goal          The care management team will reach out to the patient again over the next 30 days.   [name] RN, CCM, [facility] RN Care Manager [phone]

## 2020-08-16 NOTE — Patient Instructions (Signed)
Take Lasix 80mg  as needed.   Your provider requests you have a lexiscan myoview.  Follow up in 3-4 months  Do the following things EVERYDAY: 1) Weigh yourself in the morning before breakfast. Write it down and keep it in a log. 2) Take your medicines as prescribed 3) Eat low salt foods--Limit salt (sodium) to 2000 mg per day.  4) Stay as active as you can everyday 5) Limit all fluids for the day to less than 2 liters

## 2020-08-16 NOTE — Telephone Encounter (Signed)
Sent in refill. Thank you.

## 2020-08-17 NOTE — Progress Notes (Signed)
Internal Medicine Clinic Resident  I have personally reviewed this encounter including the documentation in this note and/or discussed this patient with the care management provider. I will address any urgent items identified by the care management provider and will communicate my actions to the patient's PCP. I have reviewed the patient's CCM visit with my supervising attending, Dr Heber [place].  Plan: - Will reach out to Red team to address patient's medication non-compliance.   Jose Persia, MD 08/17/2020

## 2020-08-27 ENCOUNTER — Other Ambulatory Visit: Payer: Self-pay | Admitting: Student

## 2020-08-27 ENCOUNTER — Telehealth: Payer: Self-pay

## 2020-08-27 ENCOUNTER — Other Ambulatory Visit: Payer: Self-pay | Admitting: Internal Medicine

## 2020-08-27 DIAGNOSIS — L299 Pruritus, unspecified: Secondary | ICD-10-CM

## 2020-08-27 NOTE — Telephone Encounter (Signed)
Called and spoke with patient, she is aware of appt 08/30/20 at 3 pm with Adline Peals, PA.

## 2020-08-27 NOTE — Telephone Encounter (Signed)
Merlin alert received Ongoing AF as of 11/28.    Pt meds include Xarelto for [place] and Carvedilol.    Spoke with pt, she was not aware of HR being out rhythm, although does indicate that she has had SOB over the past couple of days requiring her to use extra inhaler doses.  Pt denies any other cardiac symptoms.

## 2020-08-29 ENCOUNTER — Telehealth (HOSPITAL_COMMUNITY): Payer: Self-pay | Admitting: *Deleted

## 2020-08-29 NOTE — Telephone Encounter (Signed)
Patient given detailed instructions per Myocardial Perfusion Study Information Sheet for the test on 09/03/20 at 7:45. Patient notified to arrive 15 minutes early and that it is imperative to arrive on time for appointment to keep from having the test rescheduled.  If you need to cancel or reschedule your appointment, please call the office within 24 hours of your appointment. . Patient verbalized understanding.[name] [name]

## 2020-08-30 ENCOUNTER — Ambulatory Visit (INDEPENDENT_AMBULATORY_CARE_PROVIDER_SITE_OTHER): Payer: Medicaid Other

## 2020-08-30 ENCOUNTER — Ambulatory Visit (HOSPITAL_COMMUNITY)
Admission: RE | Admit: 2020-08-30 | Discharge: 2020-08-30 | Disposition: A | Discharge status: Home / Self Care | Payer: Medicare Other | Source: Ambulatory Visit | Attending: Physician Assistant | Admitting: Physician Assistant

## 2020-08-30 ENCOUNTER — Encounter (HOSPITAL_COMMUNITY): Payer: Self-pay | Admitting: Physician Assistant

## 2020-08-30 ENCOUNTER — Other Ambulatory Visit: Payer: Self-pay

## 2020-08-30 VITALS — BP 138/84 | HR 64 | Ht 61.0 in | Wt 114.6 lb

## 2020-08-30 DIAGNOSIS — I480 Paroxysmal atrial fibrillation: Secondary | ICD-10-CM | POA: Insufficient documentation

## 2020-08-30 DIAGNOSIS — D6869 Other thrombophilia: Secondary | ICD-10-CM | POA: Insufficient documentation

## 2020-08-30 DIAGNOSIS — Z950 Presence of cardiac pacemaker: Secondary | ICD-10-CM | POA: Insufficient documentation

## 2020-08-30 DIAGNOSIS — I130 Hypertensive heart and chronic kidney disease with heart failure and stage 1 through stage 4 chronic kidney disease, or unspecified chronic kidney disease: Secondary | ICD-10-CM | POA: Diagnosis not present

## 2020-08-30 DIAGNOSIS — N184 Chronic kidney disease, stage 4 (severe): Secondary | ICD-10-CM | POA: Diagnosis not present

## 2020-08-30 DIAGNOSIS — E1122 Type 2 diabetes mellitus with diabetic chronic kidney disease: Secondary | ICD-10-CM | POA: Insufficient documentation

## 2020-08-30 DIAGNOSIS — I5022 Chronic systolic (congestive) heart failure: Secondary | ICD-10-CM | POA: Diagnosis not present

## 2020-08-30 DIAGNOSIS — I2510 Atherosclerotic heart disease of native coronary artery without angina pectoris: Secondary | ICD-10-CM | POA: Insufficient documentation

## 2020-08-30 DIAGNOSIS — F17210 Nicotine dependence, cigarettes, uncomplicated: Secondary | ICD-10-CM | POA: Diagnosis not present

## 2020-08-30 DIAGNOSIS — Z7901 Long term (current) use of anticoagulants: Secondary | ICD-10-CM | POA: Diagnosis not present

## 2020-08-30 DIAGNOSIS — I495 Sick sinus syndrome: Secondary | ICD-10-CM | POA: Diagnosis not present

## 2020-08-30 LAB — CUP PACEART REMOTE DEVICE CHECK
Battery Remaining Longevity: 120 mo
Battery Remaining Percentage: 95.5 %
Battery Voltage: 2.99 V
Brady Statistic AP VP Percent: 85 %
Brady Statistic AP VS Percent: 1 %
Brady Statistic AS VP Percent: 15 %
Brady Statistic AS VS Percent: 1 %
Brady Statistic RA Percent Paced: 83 %
Brady Statistic RV Percent Paced: 99 %
Date Time Interrogation Session: 20211202020017
Implantable Lead Implant Date: 20110225
Implantable Lead Implant Date: 20110225
Implantable Lead Location: 753859
Implantable Lead Location: 753860
Implantable Pulse Generator Implant Date: 20200306
Lead Channel Impedance Value: 390 Ohm
Lead Channel Impedance Value: 400 Ohm
Lead Channel Pacing Threshold Amplitude: 0.625 V
Lead Channel Pacing Threshold Amplitude: 0.875 V
Lead Channel Pacing Threshold Pulse Width: 0.4 ms
Lead Channel Pacing Threshold Pulse Width: 0.4 ms
Lead Channel Sensing Intrinsic Amplitude: 3 mV
Lead Channel Setting Pacing Amplitude: 1.125
Lead Channel Setting Pacing Amplitude: 1.625
Lead Channel Setting Pacing Pulse Width: 0.4 ms
Lead Channel Setting Sensing Sensitivity: 4 mV
Pulse Gen Model: 2272
Pulse Gen Serial Number: 9114794
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
## 2020-09-03 ENCOUNTER — Other Ambulatory Visit: Payer: Self-pay

## 2020-09-03 ENCOUNTER — Other Ambulatory Visit: Payer: Self-pay | Admitting: Internal Medicine

## 2020-09-03 ENCOUNTER — Ambulatory Visit (HOSPITAL_COMMUNITY): Payer: Medicare Other | Attending: Cardiology

## 2020-09-03 DIAGNOSIS — K529 Noninfective gastroenteritis and colitis, unspecified: Secondary | ICD-10-CM

## 2020-09-03 DIAGNOSIS — R079 Chest pain, unspecified: Secondary | ICD-10-CM | POA: Insufficient documentation

## 2020-09-03 LAB — MYOCARDIAL PERFUSION IMAGING
LV dias vol: 116 mL (ref 46–106)
LV sys vol: 84 mL
Peak HR: 75 {beats}/min
Rest HR: 60 {beats}/min
SDS: 0
SRS: 3
SSS: 3
TID: 0.98

## 2020-09-03 MED ORDER — TECHNETIUM TC 99M TETROFOSMIN IV KIT
31.9000 | PACK | Freq: Once | INTRAVENOUS | Status: AC | PRN
  Administered 2020-09-03: 31.9 via INTRAVENOUS
  Filled 2020-09-03: qty 32

## 2020-09-03 MED ORDER — REGADENOSON 0.4 MG/5ML IV SOLN
0.4000 mg | Freq: Once | INTRAVENOUS | Status: AC
  Administered 2020-09-03: 0.4 mg via INTRAVENOUS

## 2020-09-03 MED ORDER — TECHNETIUM TC 99M TETROFOSMIN IV KIT
10.1000 | PACK | Freq: Once | INTRAVENOUS | Status: AC | PRN
  Administered 2020-09-03: 10.1 via INTRAVENOUS
  Filled 2020-09-03: qty 11

## 2020-09-04 ENCOUNTER — Telehealth: Payer: Self-pay

## 2020-09-04 NOTE — Telephone Encounter (Signed)
[facility] alert received 09/01/20 for increase in AF burden, <1%, patient seen in AF clinic on 08/30/20, no changes at that time d/t patient converted back to SR. Calling patient to send manual to see if back in rhythm.  Called to assess. No asnwer, LMOVM.

## 2020-09-05 NOTE — Telephone Encounter (Signed)
LMOVM for the pt to send a manual transmission.

## 2020-09-07 ENCOUNTER — Telehealth (HOSPITAL_COMMUNITY): Payer: Self-pay

## 2020-09-07 NOTE — Progress Notes (Signed)
Remote pacemaker transmission.   

## 2020-09-07 NOTE — Telephone Encounter (Signed)
Malena Edman, RN  09/07/2020 2:41 PM EST Back to Top     Patient advised and verbalized understanding

## 2020-09-07 NOTE — Telephone Encounter (Signed)
-----   Message from Jolaine Artist, MD sent at 09/04/2020  8:27 AM EST ----- EF 30%. No signs of ischemia. Will follow.

## 2020-09-10 NOTE — Telephone Encounter (Signed)
LMOVM for pt to send manual transmission.  

## 2020-09-10 NOTE — Telephone Encounter (Signed)
  Presenting: AP/VP 60 bpm. Continue to monitor. No changes per last AF clinic note.

## 2020-09-10 NOTE — Telephone Encounter (Signed)
Transmission received.

## 2020-09-11 ENCOUNTER — Ambulatory Visit (INDEPENDENT_AMBULATORY_CARE_PROVIDER_SITE_OTHER): Payer: Medicare Other | Admitting: Internal Medicine

## 2020-09-11 ENCOUNTER — Encounter: Payer: Self-pay | Admitting: Internal Medicine

## 2020-09-11 ENCOUNTER — Telehealth: Payer: Medicaid Other

## 2020-09-11 ENCOUNTER — Other Ambulatory Visit: Payer: Self-pay

## 2020-09-11 ENCOUNTER — Telehealth: Payer: Self-pay | Admitting: *Deleted

## 2020-09-11 VITALS — BP 134/70 | HR 60 | Ht 61.0 in | Wt 117.6 lb

## 2020-09-11 DIAGNOSIS — Z950 Presence of cardiac pacemaker: Secondary | ICD-10-CM | POA: Diagnosis not present

## 2020-09-11 DIAGNOSIS — I442 Atrioventricular block, complete: Secondary | ICD-10-CM | POA: Diagnosis not present

## 2020-09-11 DIAGNOSIS — I429 Cardiomyopathy, unspecified: Secondary | ICD-10-CM | POA: Diagnosis not present

## 2020-09-11 NOTE — Telephone Encounter (Signed)
  Care Management   Outreach Note  09/11/2020 Name: [name] [name] MRN: [id number] DOB: 1951/07/29  Referred by: [name], MD Reason for referral : Care Coordination (NIDDM,  HTN, HLD, CAD, CKD stage 4, CHB with pacemaker, L BKA)   [place] Diane Hannan is enrolled in a Managed Medicaid Health Plan: Yes  An unsuccessful telephone outreach was attempted today. The patient was referred to the case management team for assistance with care management and care coordination.   Follow Up Plan: A HIPAA compliant phone message was left for the patient providing contact information and requesting a return call.  The care management team will reach out to the patient again over the next 7-14 days.   [name] RN, CCM, [facility] RN Care Manager [phone]

## 2020-09-11 NOTE — Patient Instructions (Signed)
Medication Instructions:  Your physician recommends that you continue on your current medications as directed. Please refer to the Current Medication list given to you today.  Labwork: None ordered.  Testing/Procedures: None ordered.  Follow-Up: Your physician wants you to follow-up in: one year with Dr. Lovena Le.   You will receive a reminder letter in the mail two months in advance. If you don't receive a letter, please call our office to schedule the follow-up appointment.  Remote monitoring is used to monitor your Pacemaker from home. This monitoring reduces the number of office visits required to check your device to one time per year. It allows [place] to keep an eye on the functioning of your device to ensure it is working properly. You are scheduled for a device check from home on 11/29/2020. You may send your transmission at any time that day. If you have a wireless device, the transmission will be sent automatically. After your physician reviews your transmission, you will receive a postcard with your next transmission date.  Any Other Special Instructions Will Be Listed Below (If Applicable).  If you need a refill on your cardiac medications before your next appointment, please call your pharmacy.
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
## 2020-09-12 ENCOUNTER — Other Ambulatory Visit: Payer: Self-pay | Admitting: Internal Medicine

## 2020-09-12 DIAGNOSIS — I739 Peripheral vascular disease, unspecified: Secondary | ICD-10-CM

## 2020-09-12 DIAGNOSIS — I25118 Atherosclerotic heart disease of native coronary artery with other forms of angina pectoris: Secondary | ICD-10-CM

## 2020-09-13 ENCOUNTER — Other Ambulatory Visit: Payer: Self-pay | Admitting: Internal Medicine

## 2020-09-13 DIAGNOSIS — K529 Noninfective gastroenteritis and colitis, unspecified: Secondary | ICD-10-CM

## 2020-09-14 ENCOUNTER — Other Ambulatory Visit: Payer: Self-pay | Admitting: Student

## 2020-09-14 ENCOUNTER — Telehealth: Payer: Self-pay | Admitting: *Deleted

## 2020-09-14 DIAGNOSIS — I10 Essential (primary) hypertension: Secondary | ICD-10-CM

## 2020-09-14 NOTE — Telephone Encounter (Signed)
Please call [name] at [phone] with [facility] as she need orders to continue PCS. Present order expire the end of this month.

## 2020-09-17 NOTE — Telephone Encounter (Signed)
  Chronic Care Management   Outreach Note  09/17/2020 Name: Makayia Duplessis MRN: [id number] DOB: 10-17-1950  Referred by: [name], MD Reason for referral : PCS   Starlina Lapre is enrolled in a Managed Medicaid Health Plan: Yes   Spoke to [place] with [employer].  Requesting order from doctor for continued PCS.  Per [name], order only needs to state "per treatment plan" and include code [phone].  Order should be faxed to her attention at [place], [place] [phone]

## 2020-09-18 ENCOUNTER — Telehealth: Payer: Medicaid Other

## 2020-09-19 ENCOUNTER — Ambulatory Visit: Payer: Medicare Other | Admitting: *Deleted

## 2020-09-19 DIAGNOSIS — N184 Chronic kidney disease, stage 4 (severe): Secondary | ICD-10-CM

## 2020-09-19 DIAGNOSIS — E1122 Type 2 diabetes mellitus with diabetic chronic kidney disease: Secondary | ICD-10-CM

## 2020-09-19 DIAGNOSIS — I129 Hypertensive chronic kidney disease with stage 1 through stage 4 chronic kidney disease, or unspecified chronic kidney disease: Secondary | ICD-10-CM

## 2020-09-19 DIAGNOSIS — I10 Essential (primary) hypertension: Secondary | ICD-10-CM

## 2020-09-19 DIAGNOSIS — I509 Heart failure, unspecified: Secondary | ICD-10-CM

## 2020-09-19 NOTE — Patient Instructions (Signed)
Visit Information It was nice speaking with you today. Patient Care Plan: CCM RN - Improve Medication Taking Behavior    Problem Identified: Medication Adherence (Wellness)     Goal: Medication Adherence Maintained   Start Date: 09/19/2020  Expected End Date: 11/26/2020  This Visit's Progress: On track  Priority: High  Note:   Current Barriers:  . Non-adherence to prescribed medication regimen . Does not adhere to prescribed medication regimen- patient  states she will only take medications form a bottle and refuses to use the pill packs that her pharmacy was directed to send by the [facility] staff, during medication reconciliation it was determined that patient is not taking Bumex, Jardiance and sodium bicarb  Nurse Case Manager Clinical Goal(s):  Marland Kitchen Over the next 30-60 days, patient will work with clinic pharmacist to improve medication taking behavior  Interventions:  . 1:1 collaboration with [name] Payment, MD regarding development and update of comprehensive plan of care as evidenced by provider attestation and co-signature . Inter-disciplinary care team collaboration (see longitudinal plan of care) . Reviewed medications with patient and assessed medication taking behavior . Pharmacy referral for inconsistent medication taking behavior  Patient Goals/Self-Care Activities Over the next 30-60 days, patient will:  - Patient will self administer medications as prescribed Patient will attend all scheduled provider appointments Patient will call pharmacy for medication refills Meet with pharmacist to address medication taking barriers  Follow Up Plan: The care management team will reach out to the patient again over the next 30-60 days.        Task: Optimize Medication Use   Due Date: 10/29/2020  Priority: ASAP  Note:   Care Management Activities:    - barriers to medication adherence identified - messaged provider for referral to clinic pharmacist for counseling -  medication-adherence assessment completed    Notes:    Patient Care Plan: CCM RN Fall risk due to amputation and lives alone and reported hx of falls    Problem Identified: Fall Risk     Goal: Patient will call health plan customer service number and determine if personal safety alarm system is a benefit of the plan   Start Date: 09/19/2020  Expected End Date: 10/29/2020  This Visit's Progress: On track  Priority: High  Note:   Current Barriers:  . At risk for falls, lives alone, has LE amputation . Unable to independently determine health plan benefit for personal safety alarm system  Nurse Case Manager Clinical Goal(s):  Marland Kitchen Over the next 30-60 days, patient will verbalize understanding of plan to contact customer service of health plan to determine benefit for personal safety alarm system  Interventions:  . 1:1 collaboration with [name] Payment, MD regarding development and update of comprehensive plan of care as evidenced by provider attestation and co-signature . Inter-disciplinary care team collaboration (see longitudinal plan of care)- messaged provider requesting referral to clinic pharmacist to address medication taking barriers and for refill son Bumex, Jardiance and sodium bicarb . Collaborated with [employer] staff member [name] [name]  regarding health plan benefits . Advised patient [name] [name] at [facility] suggests patient call customer service for benefit determination related to personal safety alarm system  Patient Goals/Self-Care Activities Over the next 30-60 days, patient will:  -call customer service on [facility] card and ask about benefit for personal safety alarm system  Follow Up Plan: The care management team will reach out to the patient again over the next 30-60 days.  The patient verbalized understanding of instructions, educational materials, and care plan provided today and declined offer to receive copy of patient instructions,  educational materials, and care plan.   The care management team will reach out to the patient again over the next 30-60 days.   [name] Churn RN, CCM, [facility] RN Care Manager [phone]

## 2020-09-19 NOTE — Chronic Care Management (AMB) (Signed)
Care Management   Follow Up Note   09/19/2020 Name: [name] [name] MRN: [id number] DOB: 23-Apr-1951  [place] is enrolled in a Managed Medicaid plan: Yes. Outreach attempt today was successful.    Referred by: [name], MD Reason for referral : Chronic Care Management (NIDDM,  HTN, HLD, CAD, CKD stage 4, CHB with pacemaker, L BKA-)   [name] [name] is a 69 y.o. year old female who is a primary care patient of [name], MD. The care management team was consulted for assistance with care management and care coordination needs.    Review of patient status, including review of consultants reports, relevant laboratory and other test results, and collaboration with appropriate care team members and the patient's provider was performed as part of comprehensive patient evaluation and provision of chronic care management services.    Patient Care Plan: CCM RN - Improve Medication Taking Behavior    Problem Identified: Medication Adherence (Wellness)     Goal: Medication Adherence Maintained   Start Date: 09/19/2020  Expected End Date: 11/26/2020  This Visit's Progress: On track  Priority: High  Note:   Current Barriers:  . Non-adherence to prescribed medication regimen . Does not adhere to prescribed medication regimen- patient  states she will only take medications form a bottle and refuses to use the pill packs that her pharmacy was directed to send by the [facility] staff, during medication reconciliation it was determined that patient is not taking Bumex, Jardiance and sodium bicarb  Nurse Case Manager Clinical Goal(s):  Marland Kitchen Over the next 30-60 days, patient will work with clinic pharmacist to improve medication taking behavior  Interventions:  . 1:1 collaboration with [name], MD regarding development and update of comprehensive plan of care as evidenced by provider attestation and co-signature- messaged provider requesting referral to clinic  pharmacist to address medication taking barriers and for refill son Bumex, Jardiance and sodium bicarb . Inter-disciplinary care team collaboration (see longitudinal plan of care) . Reviewed medications with patient and assessed medication taking behavior . Pharmacy referral for inconsistent medication taking behavior  Patient Goals/Self-Care Activities Over the next 30-60 days, patient will:  - Patient will self administer medications as prescribed Patient will attend all scheduled provider appointments Patient will call pharmacy for medication refills Meet with pharmacist to address medication taking barriers  Follow Up Plan: The care management team will reach out to the patient again over the next 30-60 days.        Task: Optimize Medication Use   Due Date: 10/29/2020  Priority: ASAP  Note:   Care Management Activities:    - barriers to medication adherence identified - messaged provider for referral to clinic pharmacist for counseling - medication-adherence assessment completed    Notes:    Patient Care Plan: CCM RN Fall risk due to amputation and lives alone and reported hx of falls    Problem Identified: Fall Risk     Goal: Patient will call helat plan customer service number and determine if personal safety alarm system is a benefit of the plan   Start Date: 09/19/2020  Expected End Date: 10/29/2020  This Visit's Progress: On track  Priority: High  Note:   Current Barriers:  . At risk for falls, lives alone, has LE amputation . Unable to independently determine health plan benefit for personal safety alarm system  Nurse Case Manager Clinical Goal(s):  Marland Kitchen Over the next 30-60 days, patient will verbalize understanding of plan to contact customer service  of health plan to determine benefit for personal safety alarm system  Interventions:  . 1:1 collaboration with [name], MD regarding development and update of comprehensive plan of care as evidenced by provider  attestation and co-signature . Inter-disciplinary care team collaboration (see longitudinal plan of care) . Collaborated with [employer] staff member [place]  regarding health plan benefits . Advised patient [name] at [facility] suggests patient call customer service for benefit determination related to personal safety alarm system   Patient Goals/Self-Care Activities Over the next 30-60 days, patient will:  -call customer service on [facility] card and ask about benefit for personal safety alarm system  Follow Up Plan: The care management team will reach out to the patient again over the next 30-60 days.           The care management team will reach out to the patient again over the next 30-60 days.   [name] RN, CCM, [facility] RN Care Manager [phone]

## 2020-09-20 ENCOUNTER — Other Ambulatory Visit: Payer: Self-pay | Admitting: Internal Medicine

## 2020-09-20 DIAGNOSIS — I509 Heart failure, unspecified: Secondary | ICD-10-CM

## 2020-09-20 MED ORDER — BUMETANIDE 1 MG PO TABS: 1.0000 mg | ORAL_TABLET | Freq: Two times a day (BID) | ORAL | 2 refills | Status: DC

## 2020-09-20 MED ORDER — EMPAGLIFLOZIN 10 MG PO TABS: 10.0000 mg | ORAL_TABLET | Freq: Every day | ORAL | 2 refills | Status: DC

## 2020-09-20 MED ORDER — SODIUM BICARBONATE 650 MG PO TABS: 650.0000 mg | ORAL_TABLET | Freq: Three times a day (TID) | ORAL | 3 refills | Status: DC

## 2020-09-20 NOTE — Progress Notes (Signed)
Internal Medicine Clinic Resident  I have personally reviewed this encounter including the documentation in this note and/or discussed this patient with the care management provider. I will address any urgent items identified by the care management provider and will communicate my actions to the patient's PCP. I have reviewed the patient's CCM visit with my supervising attending, Dr Jimmye Norman.  Jean Rosenthal, MD 09/20/2020

## 2020-09-20 NOTE — Progress Notes (Signed)
Internal Medicine Clinic Attending  Case discussed with Dr. Eileen Stanford .  We reviewed the AWV findings.  I agree with the assessment, diagnosis, and plan of care documented in the AWV note.

## 2020-10-05 ENCOUNTER — Other Ambulatory Visit: Payer: Self-pay

## 2020-10-05 ENCOUNTER — Ambulatory Visit (INDEPENDENT_AMBULATORY_CARE_PROVIDER_SITE_OTHER): Payer: Medicare Other | Admitting: Internal Medicine

## 2020-10-05 ENCOUNTER — Encounter: Payer: Self-pay | Admitting: Internal Medicine

## 2020-10-05 VITALS — BP 141/111 | HR 70 | Temp 97.8°F | Ht 61.0 in | Wt 122.2 lb

## 2020-10-05 DIAGNOSIS — I480 Paroxysmal atrial fibrillation: Secondary | ICD-10-CM | POA: Diagnosis not present

## 2020-10-05 DIAGNOSIS — N184 Chronic kidney disease, stage 4 (severe): Secondary | ICD-10-CM

## 2020-10-05 DIAGNOSIS — Z7901 Long term (current) use of anticoagulants: Secondary | ICD-10-CM | POA: Diagnosis not present

## 2020-10-05 DIAGNOSIS — E1122 Type 2 diabetes mellitus with diabetic chronic kidney disease: Secondary | ICD-10-CM | POA: Diagnosis present

## 2020-10-05 DIAGNOSIS — I5020 Unspecified systolic (congestive) heart failure: Secondary | ICD-10-CM

## 2020-10-05 DIAGNOSIS — I5023 Acute on chronic systolic (congestive) heart failure: Secondary | ICD-10-CM | POA: Diagnosis not present

## 2020-10-05 DIAGNOSIS — I130 Hypertensive heart and chronic kidney disease with heart failure and stage 1 through stage 4 chronic kidney disease, or unspecified chronic kidney disease: Secondary | ICD-10-CM | POA: Diagnosis present

## 2020-10-05 DIAGNOSIS — Z794 Long term (current) use of insulin: Secondary | ICD-10-CM | POA: Diagnosis not present

## 2020-10-05 DIAGNOSIS — I129 Hypertensive chronic kidney disease with stage 1 through stage 4 chronic kidney disease, or unspecified chronic kidney disease: Secondary | ICD-10-CM

## 2020-10-05 DIAGNOSIS — I10 Essential (primary) hypertension: Secondary | ICD-10-CM

## 2020-10-05 NOTE — Patient Instructions (Signed)
Thank you, [name] for allowing [place] to provide your care today. Today we discussed Diabetes, kidney failure, heart failure.    I have ordered the following labs for you:   Lab Orders     BMP8+Anion Gap     Magnesium   Tests ordered today:  none  Referrals ordered today:   Referral Orders  No referral(s) requested today    No new referrals today, but please make an appointment to see your Nephrologist in the next month or two.   I have ordered the following medication/changed the following medications:   Stop the following medications: Medications Discontinued During This Encounter  Medication Reason  . bumetanide (BUMEX) 1 MG tablet Duplicate     Start the following medications: No orders of the defined types were placed in this encounter.    Follow up: 3 months    Remember: please take all of your medications and follow up with your doctors.   Should you have any questions or concerns please call the internal medicine clinic at [phone].     [name], D.O. [place]

## 2020-10-05 NOTE — Assessment & Plan Note (Addendum)
Patient has a history of CKD stage IV.  She admits to having some pruritus otherwise denies any overt signs of uremia.  I counseled her on the importance of following up with her nephrologist.  I discussed her risk of dialysis and encouraged her to consider whether or not this is within her goals of care.  Plan: -Follow-up with her nephrologist in the next 2 to 3 months.    **Addendum**  Patient recently had a CMP performed showing a bicarb of 76 and a GAP of 18. She states that she is not currently taking Na bicarb for her AGMA, despite that it is listed on her med list. I counseled her regarding this medications and told her I would refill it today. I will continue her at NaHCO3 650 mg TID. She should be titrated up to achieve a serum bicarbonate above 23 mEq/L per 2013 KDIGO guidelines.   CMP     Component Value Date/Time   NA 141 10/05/2020 1028   K 4.2 10/05/2020 1028   CL 106 10/05/2020 1028   CO2 17 (L) 10/05/2020 1028   GLUCOSE 91 10/05/2020 1028   GLUCOSE 101 (H) 07/16/2020 1750   BUN 26 10/05/2020 1028   CREATININE 2.53 (H) 10/05/2020 1028   CREATININE 2.26 (H) 04/07/2018 1330   CALCIUM 9.0 10/05/2020 1028   CALCIUM 9.3 08/28/2011 1648   PROT 7.0 07/16/2020 1750   ALBUMIN 3.4 (L) 07/16/2020 1750   AST 25 07/16/2020 1750   ALT 16 07/16/2020 1750   ALKPHOS 75 07/16/2020 1750   BILITOT 1.5 (H) 07/16/2020 1750   GFRNONAA 19 (L) 10/05/2020 1028   GFRNONAA 16 (L) 07/16/2020 1750   GFRNONAA 29 (L) 11/10/2012 1115   GFRAA 22 (L) 10/05/2020 1028   GFRAA 33 (L) 11/10/2012 1115
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
## 2020-10-05 NOTE — Assessment & Plan Note (Signed)
Patient presents for reevaluation management of her paroxysmal atrial fibrillation.  Currently her rate is controlled.  Her CHA2DS2-VASc score was elevated at 6 with a 9.7% risk of stroke annually.  She is on Xarelto 15 mg daily and Coreg 12.5 mg twice daily with good rate control.  She denies any problems with bleeding and there are no overt signs of bleeding on exam.  Plan: -Continue Xarelto 15 mg daily and Coreg 12.5 mg twice daily.

## 2020-10-05 NOTE — Assessment & Plan Note (Addendum)
Patient has a history of diabetes now with CKD stage IV.  She continues to have elevated CBGs. Jardiance 10 mg and tolerating it well.  She will need a new eye exam this year.  Her foot exam today was without neuropathy and good pulses.  Plan: -Jardiance 10 mg

## 2020-10-05 NOTE — Assessment & Plan Note (Addendum)
Here with reduced ejection fraction thought to be secondary to ischemic cardiomyopathy.  Her EF is 20 to 25% with significantly elevated pulmonary pressures.  She does have a pacemaker in place.  Recently saw Dr. Lovena Le due to concern for needing biventricular pacemaker in the setting of her worsening heart failure.  She is currently taking Coreg 12.5 mg twice daily, BiDil 20-37.5 mg 3 times daily, furosemide 80 mg daily, empagliflozin 10 mg, valsartan 80 mg.  She states that she is compliant with all of this medication and denies any medication side effects.  On exam today she does not have any evidence of hypervolemia.  Plan: -Continue current medication management -Follow-up with cardiology regarding biventricular pacemaker -Patient was discontinued on Bumex 1 mg daily.  I reminded her that she is no longer taking that medication. -BMP and magnesium today.

## 2020-10-05 NOTE — Assessment & Plan Note (Signed)
Patient presents today for further evaluation and management of her HTN.   Her blood pressure is uncontrolled on the following medications:  1. Coreg 12.5 mg twice daily 2. BiDil 20--37.5 mg 3 times daily 3. Valsartan 80 mg daily  She admits to being adherent with her antihypertensive medications.   She identifies the following barriers: she denies barriers.   She admits to no side effects noted.   Current exercise regimen: limited due to BKA  Diet habits: Drinks 3-4 8 ounce cups of water daily and limits diet to less than 2 g salt daily.  Checking BP at home: No    Current Blood pressure for today's visit is listed below:  Blood Pressure 10/05/2020 09/11/2020 08/30/2020 08/16/2020 08/06/2020  BP 141/111 134/70 138/84 118/64 159/77  Some recent data might be hidden     Plan: 1. Continue current antihypertensive management 2. DASH diet  3. < 2.3 g/day Salt intake 4. Weight loss & aerobic exercise  (at least 150 minutes of moderate-intensity - 30 minutes for 5+ days/week)  5. Limit ETOH use (< 2 drinks/day for women and < 3 drinks/day for Men)

## 2020-10-06 LAB — BMP8+ANION GAP
Anion Gap: 18 mmol/L (ref 10.0–18.0)
BUN/Creatinine Ratio: 10 — ABNORMAL LOW (ref 12–28)
BUN: 26 mg/dL (ref 8–27)
CO2: 17 mmol/L — ABNORMAL LOW (ref 20–29)
Calcium: 9 mg/dL (ref 8.7–10.3)
Chloride: 106 mmol/L (ref 96–106)
Creatinine, Ser: 2.53 mg/dL — ABNORMAL HIGH (ref 0.57–1.00)
GFR calc Af Amer: 22 mL/min/{1.73_m2} — ABNORMAL LOW (ref >59)
GFR calc non Af Amer: 19 mL/min/{1.73_m2} — ABNORMAL LOW (ref >59)
Glucose: 91 mg/dL (ref 65–99)
Potassium: 4.2 mmol/L (ref 3.5–5.2)
Sodium: 141 mmol/L (ref 134–144)

## 2020-10-06 LAB — MAGNESIUM: Magnesium: 1.7 mg/dL (ref 1.6–2.3)

## 2020-10-09 ENCOUNTER — Other Ambulatory Visit: Payer: Self-pay | Admitting: Internal Medicine

## 2020-10-12 NOTE — Progress Notes (Signed)
Internal Medicine Clinic Attending  Case discussed with Dr. Coe  At the time of the visit.  We reviewed the resident's history and exam and pertinent patient test results.  I agree with the assessment, diagnosis, and plan of care documented in the resident's note.  

## 2020-10-16 MED ORDER — SODIUM BICARBONATE 650 MG PO TABS: 650.0000 mg | ORAL_TABLET | Freq: Three times a day (TID) | ORAL | 3 refills | Status: DC

## 2020-10-16 NOTE — Addendum Note (Signed)
Addended by: Lawerance Cruel on: 10/16/2020 10:38 AM   Modules accepted: Orders

## 2020-10-17 ENCOUNTER — Telehealth: Payer: Self-pay

## 2020-10-17 NOTE — Telephone Encounter (Signed)
[facility] Alert for long AT/AF episode & AF burden; AF burden 2.1%; longest 6 to 9 hours; EGM suggest A Fib - ongoing (undersensing noted on presenting rhythm); V rates controlled. History of AF and on rivaroxaban.  Spoke with pt, she reports no change in how she is feeling lately.  She denies any chest pain, SOB or increased fatigue.    Pt confirms compliance with meds as ordered including Carvedilol 12.'5mg'$  BID and Xarelto '15mg'$  daily.    Pt was seen in Af clinic in early December following previous increased AF episodes, at that time she spontaneously converted and no changes were made.  Advised I would forward to Dr. Lovena Le for review, anticipate continued monitoring and we will contact her if there are any changes.

## 2020-10-18 ENCOUNTER — Telehealth: Payer: Medicare Other

## 2020-10-22 ENCOUNTER — Ambulatory Visit: Payer: Medicare Other | Admitting: *Deleted

## 2020-10-22 ENCOUNTER — Telehealth: Payer: Self-pay | Admitting: *Deleted

## 2020-10-22 DIAGNOSIS — N184 Chronic kidney disease, stage 4 (severe): Secondary | ICD-10-CM

## 2020-10-22 DIAGNOSIS — I5020 Unspecified systolic (congestive) heart failure: Secondary | ICD-10-CM

## 2020-10-22 DIAGNOSIS — I5023 Acute on chronic systolic (congestive) heart failure: Secondary | ICD-10-CM

## 2020-10-22 DIAGNOSIS — I10 Essential (primary) hypertension: Secondary | ICD-10-CM

## 2020-10-22 DIAGNOSIS — I480 Paroxysmal atrial fibrillation: Secondary | ICD-10-CM

## 2020-10-22 DIAGNOSIS — I129 Hypertensive chronic kidney disease with stage 1 through stage 4 chronic kidney disease, or unspecified chronic kidney disease: Secondary | ICD-10-CM

## 2020-10-22 DIAGNOSIS — E1122 Type 2 diabetes mellitus with diabetic chronic kidney disease: Secondary | ICD-10-CM

## 2020-10-22 NOTE — Chronic Care Management (AMB) (Signed)
Care Management   Follow Up Note   10/22/2020 Name: [name] [name] MRN: [id number] DOB: May 05, 1951  [place] is enrolled in a Managed Medicaid plan: Yes. Outreach attempt today was successful.    Referred by: [name], MD Reason for referral : Care Coordination (NIDDM,  HTN, HLD, CAD, CKD stage 4, CHB with pacemaker, L BKA)   [name] [name] is a 70 y.o. year old female who is a primary care patient of [name], MD. The care management team was consulted for assistance with care management and care coordination needs.    Review of patient status, including review of consultants reports, relevant laboratory and other test results, and collaboration with appropriate care team members and the patient's provider was performed as part of comprehensive patient evaluation and provision of chronic care management services.    Patient Care Plan: CCM RN - Improve Medication Taking Behavior    Problem Identified: Medication Adherence (Wellness)     Goal: Medication Adherence Improved   Start Date: 09/19/2020  Expected End Date: 03/28/2021  This Visit's Progress: Not on track  Recent Progress: On track  Priority: High  Note:   Current Barriers:  . Non-adherence to prescribed medication regimen . Does not adhere to prescribed medication regimen- patient  states she does not think she is taking sodium bicarb as she was directed by Dr [name] on 10/05/20 during her clinic visit and by this CCM RN on 09/19/20  Nurse Case Manager Clinical Goal(s):  Marland Kitchen Over the next 30-60 days, patient will work with clinic pharmacist to improve medication taking behavior  Interventions:  . 1:1 collaboration with [name], MD regarding development and update of comprehensive plan of care as evidenced by provider attestation and co-signature . Inter-disciplinary care team collaboration (see longitudinal plan of care) . Reviewed medications with patient and assessed medication taking  behavior . Will ask provider again for pharmacy referral for inconsistent medication taking behavior  Patient Goals/Self-Care Activities Over the next 30-60 days, patient will:  - Patient will self administer medications as prescribed Patient will attend all scheduled provider appointments Patient will call pharmacy for medication refills Meet with pharmacist to address medication taking barriers  Follow Up Plan: The care management team will reach out to the patient again over the next 30-60 days.        Task: Optimize Medication Use   Due Date: 10/29/2020  Priority: ASAP  Note:   Care Management Activities:    - barriers to medication adherence identified - messaged provider for referral to clinic pharmacist for counseling - medication-adherence assessment completed    Notes:    Patient Care Plan: CCM RN Fall risk due to amputation and lives alone and reported hx of falls    Problem Identified: Fall Risk     Goal: Patient will call health plan customer service number and determine if personal safety alarm system is a benefit of the plan   Start Date: 09/19/2020  Expected End Date: 11/26/2020  This Visit's Progress: Not on track  Recent Progress: On track  Priority: High  Note:   Current Barriers:  . At risk for falls, lives alone, has LE amputation- patient states she forgot to call her health insurance plan ,patient denies recent falls . Unable to independently determine health plan benefit for personal safety alarm system  Nurse Case Manager Clinical Goal(s):  Marland Kitchen Over the next 30-60 days, patient will verbalize understanding of plan to contact customer service of health plan to determine  benefit for personal safety alarm system  Interventions:  . 1:1 collaboration with [name], MD regarding development and update of comprehensive plan of care as evidenced by provider attestation and co-signature . Inter-disciplinary care team collaboration (see longitudinal plan  of care)- messaged provider requesting referral to clinic pharmacist to address medication taking barriers and for refills on Bumex, Jardiance and sodium bicarb . 09/19/20 Collaborated with [facility] staff member [place]  regarding health plan benefits . 10/22/20 Reminded patient to call customer service for benefit determination related to personal safety alarm system  Patient Goals/Self-Care Activities Over the next 30-60 days, patient will:  -call customer service on [facility] card and ask about benefit for personal safety alarm system  Follow Up Plan: The care management team will reach out to the patient again over the next 30-60 days.           The care management team will reach out to the patient again over the next 30-60 days.   [name] RN, CCM, [facility] RN Care Manager [phone]

## 2020-10-22 NOTE — Patient Instructions (Signed)
Visit Information It was nice speaking with you today.   Patient Care Plan: CCM RN - Improve Medication Taking Behavior    Problem Identified: Medication Adherence (Wellness)     Goal: Medication Adherence Improved   Start Date: 09/19/2020  Expected End Date: 03/28/2021  This Visit's Progress: Not on track  Recent Progress: On track  Priority: High  Note:   Current Barriers:  . Non-adherence to prescribed medication regimen . Does not adhere to prescribed medication regimen- patient  states she does not think she is taking sodium bicarb as she was directed by Dr [name] on 10/05/20 during her clinic visit   Nurse Case Manager Clinical Goal(s):  Marland Kitchen Over the next 30-60 days, patient will work with clinic pharmacist to improve medication taking behavior  Interventions:  . 1:1 collaboration with [name], MD regarding development and update of comprehensive plan of care as evidenced by provider attestation and co-signature . Inter-disciplinary care team collaboration (see longitudinal plan of care) . Reviewed medications with patient and assessed medication taking behavior . Will ask provider again for pharmacy referral for inconsistent medication taking behavior  Patient Goals/Self-Care Activities Over the next 30-60 days, patient will:  - Patient will self administer medications as prescribed Patient will attend all scheduled provider appointments Patient will call pharmacy for medication refills Meet with pharmacist to address medication taking barriers  Follow Up Plan: The care management team will reach out to the patient again over the next 30-60 days.        Task: Optimize Medication Use   Due Date: 10/29/2020  Priority: ASAP  Note:   Care Management Activities:    - barriers to medication adherence identified - messaged provider for referral to clinic pharmacist for counseling - medication-adherence assessment completed    Notes:    Patient Care Plan: CCM RN Fall  risk due to amputation and lives alone and reported hx of falls    Problem Identified: Fall Risk     Goal: Patient will call health plan customer service number and determine if personal safety alarm system is a benefit of the plan   Start Date: 09/19/2020  Expected End Date: 11/26/2020  This Visit's Progress: Not on track  Recent Progress: On track  Priority: High  Note:   Current Barriers:  . At risk for falls, lives alone, has LE amputation- patient states she forgot to call her health insurance plan ,patient denies recent falls . Unable to independently determine health plan benefit for personal safety alarm system  Nurse Case Manager Clinical Goal(s):  Marland Kitchen Over the next 30-60 days, patient will verbalize understanding of plan to contact customer service of health plan to determine benefit for personal safety alarm system  Interventions:  . 1:1 collaboration with [name], MD regarding development and update of comprehensive plan of care as evidenced by provider attestation and co-signature . Inter-disciplinary care team collaboration (see longitudinal plan of care)- messaged provider requesting referral to clinic pharmacist to address medication taking barriers and for refills on Bumex, Jardiance and sodium bicarb . 09/19/20 Collaborated with [facility] staff member [place]  regarding health plan benefits . 10/22/20 Reminded patient to call customer service for benefit determination related to personal safety alarm system  Patient Goals/Self-Care Activities Over the next 30-60 days, patient will:  -call customer service on [facility] card and ask about benefit for personal safety alarm system  Follow Up Plan: The care management team will reach out to the patient again over the next  30-60 days.          The patient verbalized understanding of instructions, educational materials, and care plan provided today and declined offer to receive copy of patient  instructions, educational materials, and care plan.   The care management team will reach out to the patient again over the next 30-60 days.   [name] RN, CCM, [facility] RN Care Manager [phone]

## 2020-10-22 NOTE — Telephone Encounter (Signed)
  Care Management   Outreach Note  10/22/2020 Name: [name] [name] MRN: [id number] DOB: 10-05-1950  Referred by: [name], MD Reason for referral : Care Coordination (NIDDM,  HTN, HLD, CAD, CKD stage 4, CHB with pacemaker, L BKA-)   [name] [name] is enrolled in a Managed Medicaid Health Plan: Yes  An unsuccessful telephone outreach was attempted today. The patient was referred to the case management team for assistance with care management and care coordination.   Follow Up Plan: A HIPAA compliant phone message was left for the patient providing contact information and requesting a return call.  The care management team will reach out to the patient again over the next 7-14 days.   [name] RN, CCM, [facility] RN Care Manager [phone]

## 2020-10-23 NOTE — Progress Notes (Signed)
Internal Medicine Clinic Resident  I have personally reviewed this encounter including the documentation in this note and/or discussed this patient with the care management provider. I will address any urgent items identified by the care management provider and will communicate my actions to the patient's PCP. I have reviewed the patient's CCM visit with my supervising attending, Dr Philipp Ovens.  Actions: Discussed patient with clinical pharmacist, Hughes Better, regarding medication management counseling for this patient.   Harvie Heck, MD  [facility] PGY-2 10/23/2020

## 2020-10-25 ENCOUNTER — Encounter: Payer: Medicare Other | Admitting: Internal Medicine

## 2020-10-25 ENCOUNTER — Telehealth: Payer: Self-pay | Admitting: *Deleted

## 2020-10-25 NOTE — Telephone Encounter (Signed)
Patient called in stating atarax is not helping with vaginal itching. C/o vaginal and rectal itching and burning x 2 days. States it also burns when she voids. Denies vaginal discharge, urinary frequency or urgency. Appt given this PM with Yellow Team as Red Team his full. Hubbard Hartshorn, BSN, RN-BC

## 2020-10-25 NOTE — Progress Notes (Signed)
Internal Medicine Clinic Attending  CCM services provided by the care management provider and their documentation were discussed with Dr. Marva Panda. We reviewed the pertinent findings, urgent action items addressed by the resident and non-urgent items to be addressed by the PCP.  I agree with the assessment, diagnosis, and plan of care documented in the CCM and resident's note.  Velna Ochs, MD 10/25/2020

## 2020-10-29 LAB — HM DIABETES EYE EXAM

## 2020-10-29 NOTE — Telephone Encounter (Signed)
No change. GT 

## 2020-10-30 ENCOUNTER — Other Ambulatory Visit: Payer: Self-pay | Admitting: Internal Medicine

## 2020-11-01 ENCOUNTER — Encounter: Payer: Medicare Other | Admitting: Pharmacist

## 2020-11-01 ENCOUNTER — Other Ambulatory Visit: Payer: Self-pay | Admitting: Internal Medicine

## 2020-11-01 NOTE — Telephone Encounter (Signed)
Xarelto '15mg'$  refill request received. Pt is 70 years old, weight-55.4kg, Crea-2.53 on 10/05/2020, last seen by Dr. Lovena Le on 09/11/2020, Diagnosis-Afib, Rayne.90m/min; Dose is appropriate based on dosing criteria. Will send in refill to requested pharmacy.

## 2020-11-07 ENCOUNTER — Encounter: Payer: Self-pay | Admitting: Podiatry

## 2020-11-07 ENCOUNTER — Other Ambulatory Visit: Payer: Self-pay

## 2020-11-07 ENCOUNTER — Ambulatory Visit (INDEPENDENT_AMBULATORY_CARE_PROVIDER_SITE_OTHER): Payer: Medicare Other | Admitting: Podiatry

## 2020-11-07 DIAGNOSIS — E0843 Diabetes mellitus due to underlying condition with diabetic autonomic (poly)neuropathy: Secondary | ICD-10-CM

## 2020-11-07 DIAGNOSIS — L989 Disorder of the skin and subcutaneous tissue, unspecified: Secondary | ICD-10-CM | POA: Diagnosis not present

## 2020-11-07 DIAGNOSIS — B351 Tinea unguium: Secondary | ICD-10-CM

## 2020-11-07 DIAGNOSIS — Z89512 Acquired absence of left leg below knee: Secondary | ICD-10-CM

## 2020-11-07 DIAGNOSIS — M79676 Pain in unspecified toe(s): Secondary | ICD-10-CM | POA: Diagnosis not present

## 2020-11-07 DIAGNOSIS — L84 Corns and callosities: Secondary | ICD-10-CM | POA: Diagnosis not present

## 2020-11-07 NOTE — Progress Notes (Signed)
This patient returns to my office for at risk foot care.  This patient requires this care by a professional since this patient will be at risk due to having diabetes and pvd with BK left leg.  This patient is unable to cut nails herself since the patient cannot reach her nails.These nails are painful walking and wearing shoes.  She has painful callus on the outside bottom of her right foot.  This patient presents for at risk foot care today.  General Appearance  Alert, conversant and in no acute stress.  Vascular  Dorsalis pedis and posterior tibial  pulses are absent right foot.  Capillary return is within normal limits . Cold feet right .  Neurologic  Senn-Weinstein monofilament wire test diminished   bilaterally. Muscle power within normal limits bilaterally.  Nails Thick disfigured discolored nails with subungual debris  from hallux to fifth toes right.. No evidence of bacterial infection or drainage bilaterally.  Orthopedic  No limitations of motion  feet .  No crepitus or effusions noted.  No bony pathology or digital deformities noted.  Skin  normotropic skin right foot.  Porokeratosis/callus sub 5 right foot..  No signs of infections or ulcers noted.     Onychomycosis  Pain in right toes  Pain in left toes.  Callus on tip of right hallux. Callus sub 5th right foot  Consent was obtained for treatment procedures.   Mechanical debridement of nails 1-5  Right  performed with a nail nipper.  Filed with dremel without incident.   Debride callus with # 15 blade.   Return office visit   3 months                   Told patient to return for periodic foot care and evaluation due to potential at risk complications.   Gardiner Barefoot DPM

## 2020-11-08 ENCOUNTER — Ambulatory Visit (INDEPENDENT_AMBULATORY_CARE_PROVIDER_SITE_OTHER): Payer: Medicare Other | Admitting: Student

## 2020-11-08 ENCOUNTER — Encounter: Payer: Self-pay | Admitting: Student

## 2020-11-08 ENCOUNTER — Encounter: Payer: Medicare Other | Admitting: Pharmacist

## 2020-11-08 DIAGNOSIS — L299 Pruritus, unspecified: Secondary | ICD-10-CM | POA: Diagnosis present

## 2020-11-08 MED ORDER — HYDROXYZINE HCL 10 MG PO TABS
10.0000 mg | ORAL_TABLET | Freq: Two times a day (BID) | ORAL | 0 refills | Status: DC | PRN
Start: 2020-11-08 — End: 2021-01-14

## 2020-11-08 MED ORDER — ZIKS ARTHRITIS PAIN RELIEF 0.025-1-12 % EX CREA: TOPICAL_CREAM | CUTANEOUS | 1 refills | Status: DC

## 2020-11-08 NOTE — Assessment & Plan Note (Addendum)
Patient presents with moderate distress due to an area of itching on her right buttock which started 4 days ago. On exam there is a small area which is slightly dark in color with a small scab, likely reflecting excoriation. It is macular but no raised lesion. Slightly scaly, but may be due to excoriation. Patient has a hard time communicating due to her distress, but initially seemed to indicate that the focal area is itching and painful, and ventilator seem to indicate for generalized pruritus as well but no severe in that small area. States there is associated pain which is described as sharp and burning. Could possibly be early shingles, but no rash is present at this time. 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  On chart review, found that she was treated for pruritis in September, apparently this improved with hydralazine. When asked about if this was similar to that episode, she states "I don't know, that was too long ago." She is generally a poor historian.  Discussed hydralazine with her and she feels that she used it safely.  Reinforced that there is risk for falls and excessive sleepiness especially in the elderly, she understands and wants to use this medication again for pruritus.  States that she has a home health aide who checks in daily from Tuesday to Sunday.  -Start capsaicin cream -Start hydroxyzine 10 mg up to twice a day as needed, discussed precautions on safety and she endorses understanding -Check CMP  Addendum: CMP with elevated alk phos and bilirubin. Discussed results with patient, and have ordered RUQ ultrasound. She endorses understanding.

## 2020-11-08 NOTE — Patient Instructions (Signed)
Thank you for allowing [place] to be a part of your care today, it was a pleasure seeing you. We discussed your itching  I am checking these labs: CMP  I am sorry you are in so much discomfort.  I am starting to medications for itching. Start capsaicin cream, apply up to 4 times daily to affected area Restart hydroxyzine 10 mg, up to twice a day as needed.  This can make you very sleepy, so be careful after taking it.  I will call you with your lab results   Thank you, and please call the [facility] at [phone] if you have any questions.  Best, Dr. Bridgett Larsson
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
## 2020-11-09 ENCOUNTER — Telehealth: Payer: Self-pay

## 2020-11-09 LAB — CMP14 + ANION GAP
ALT: 12 IU/L (ref 0–32)
AST: 17 IU/L (ref 0–40)
Albumin/Globulin Ratio: 1.1 — ABNORMAL LOW (ref 1.2–2.2)
Albumin: 3.2 g/dL — ABNORMAL LOW (ref 3.8–4.8)
Alkaline Phosphatase: 135 IU/L — ABNORMAL HIGH (ref 44–121)
Anion Gap: 16 mmol/L (ref 10.0–18.0)
BUN/Creatinine Ratio: 10 — ABNORMAL LOW (ref 12–28)
BUN: 21 mg/dL (ref 8–27)
Bilirubin Total: 3.1 mg/dL — ABNORMAL HIGH (ref 0.0–1.2)
CO2: 18 mmol/L — ABNORMAL LOW (ref 20–29)
Calcium: 9.1 mg/dL (ref 8.7–10.3)
Chloride: 108 mmol/L — ABNORMAL HIGH (ref 96–106)
Creatinine, Ser: 2.06 mg/dL — ABNORMAL HIGH (ref 0.57–1.00)
GFR calc Af Amer: 28 mL/min/{1.73_m2} — ABNORMAL LOW (ref >59)
GFR calc non Af Amer: 24 mL/min/{1.73_m2} — ABNORMAL LOW (ref >59)
Globulin, Total: 3 g/dL (ref 1.5–4.5)
Glucose: 99 mg/dL (ref 65–99)
Potassium: 3.7 mmol/L (ref 3.5–5.2)
Sodium: 142 mmol/L (ref 134–144)
Total Protein: 6.2 g/dL (ref 6.0–8.5)

## 2020-11-09 NOTE — Telephone Encounter (Signed)
Received TC from patient who states her she was expecting 2 RX's to be sent to pharmacy yesterday after office visit. Per chart review, Capsaicin cream and hydroxyzine both sent to [facility] w/ receipt confirmation.  Pt instructed to call pharmacy. SChaplin, RN,BSN

## 2020-11-09 NOTE — Addendum Note (Signed)
Addended by: Andrew Au on: 11/09/2020 01:33 PM   Modules accepted: Orders

## 2020-11-09 NOTE — Progress Notes (Signed)
[facility] Attending  Case discussed with Dr. Chen  At the time of the visit.  We reviewed the resident's history and exam and pertinent patient test results.  I agree with the assessment, diagnosis, and plan of care documented in the resident's note. 

## 2020-11-11 ENCOUNTER — Emergency Department (HOSPITAL_COMMUNITY): Payer: Medicare Other

## 2020-11-11 ENCOUNTER — Inpatient Hospital Stay (HOSPITAL_COMMUNITY)
Admission: EM | Admit: 2020-11-11 | Discharge: 2020-11-14 | DRG: 644 | Disposition: A | Discharge status: Home / Self Care | Payer: Medicare Other | Attending: Internal Medicine | Admitting: Internal Medicine

## 2020-11-11 ENCOUNTER — Inpatient Hospital Stay (HOSPITAL_COMMUNITY): Payer: Medicare Other

## 2020-11-11 ENCOUNTER — Other Ambulatory Visit: Payer: Self-pay

## 2020-11-11 ENCOUNTER — Encounter (HOSPITAL_COMMUNITY): Payer: Self-pay | Admitting: *Deleted

## 2020-11-11 DIAGNOSIS — E1122 Type 2 diabetes mellitus with diabetic chronic kidney disease: Secondary | ICD-10-CM | POA: Diagnosis present

## 2020-11-11 DIAGNOSIS — E063 Autoimmune thyroiditis: Principal | ICD-10-CM | POA: Diagnosis present

## 2020-11-11 DIAGNOSIS — L299 Pruritus, unspecified: Secondary | ICD-10-CM | POA: Diagnosis not present

## 2020-11-11 DIAGNOSIS — E7800 Pure hypercholesterolemia, unspecified: Secondary | ICD-10-CM | POA: Diagnosis not present

## 2020-11-11 DIAGNOSIS — Z87442 Personal history of urinary calculi: Secondary | ICD-10-CM

## 2020-11-11 DIAGNOSIS — I2510 Atherosclerotic heart disease of native coronary artery without angina pectoris: Secondary | ICD-10-CM | POA: Diagnosis not present

## 2020-11-11 DIAGNOSIS — Z89512 Acquired absence of left leg below knee: Secondary | ICD-10-CM | POA: Diagnosis not present

## 2020-11-11 DIAGNOSIS — R188 Other ascites: Secondary | ICD-10-CM | POA: Diagnosis present

## 2020-11-11 DIAGNOSIS — Z8249 Family history of ischemic heart disease and other diseases of the circulatory system: Secondary | ICD-10-CM

## 2020-11-11 DIAGNOSIS — Z7901 Long term (current) use of anticoagulants: Secondary | ICD-10-CM

## 2020-11-11 DIAGNOSIS — H409 Unspecified glaucoma: Secondary | ICD-10-CM | POA: Diagnosis present

## 2020-11-11 DIAGNOSIS — I10 Essential (primary) hypertension: Secondary | ICD-10-CM | POA: Diagnosis not present

## 2020-11-11 DIAGNOSIS — Z90710 Acquired absence of both cervix and uterus: Secondary | ICD-10-CM

## 2020-11-11 DIAGNOSIS — I429 Cardiomyopathy, unspecified: Secondary | ICD-10-CM | POA: Diagnosis present

## 2020-11-11 DIAGNOSIS — E0590 Thyrotoxicosis, unspecified without thyrotoxic crisis or storm: Secondary | ICD-10-CM

## 2020-11-11 DIAGNOSIS — I130 Hypertensive heart and chronic kidney disease with heart failure and stage 1 through stage 4 chronic kidney disease, or unspecified chronic kidney disease: Secondary | ICD-10-CM | POA: Diagnosis not present

## 2020-11-11 DIAGNOSIS — I495 Sick sinus syndrome: Secondary | ICD-10-CM | POA: Diagnosis not present

## 2020-11-11 DIAGNOSIS — Z950 Presence of cardiac pacemaker: Secondary | ICD-10-CM | POA: Diagnosis not present

## 2020-11-11 DIAGNOSIS — R7989 Other specified abnormal findings of blood chemistry: Secondary | ICD-10-CM

## 2020-11-11 DIAGNOSIS — E1151 Type 2 diabetes mellitus with diabetic peripheral angiopathy without gangrene: Secondary | ICD-10-CM | POA: Diagnosis not present

## 2020-11-11 DIAGNOSIS — K810 Acute cholecystitis: Secondary | ICD-10-CM | POA: Diagnosis present

## 2020-11-11 DIAGNOSIS — K8020 Calculus of gallbladder without cholecystitis without obstruction: Secondary | ICD-10-CM | POA: Diagnosis not present

## 2020-11-11 DIAGNOSIS — R300 Dysuria: Secondary | ICD-10-CM | POA: Diagnosis not present

## 2020-11-11 DIAGNOSIS — E039 Hypothyroidism, unspecified: Secondary | ICD-10-CM | POA: Diagnosis not present

## 2020-11-11 DIAGNOSIS — Z20822 Contact with and (suspected) exposure to covid-19: Secondary | ICD-10-CM | POA: Diagnosis present

## 2020-11-11 DIAGNOSIS — Z823 Family history of stroke: Secondary | ICD-10-CM

## 2020-11-11 DIAGNOSIS — Z96612 Presence of left artificial shoulder joint: Secondary | ICD-10-CM | POA: Diagnosis present

## 2020-11-11 DIAGNOSIS — Z8674 Personal history of sudden cardiac arrest: Secondary | ICD-10-CM | POA: Diagnosis not present

## 2020-11-11 DIAGNOSIS — I480 Paroxysmal atrial fibrillation: Secondary | ICD-10-CM | POA: Diagnosis not present

## 2020-11-11 DIAGNOSIS — I70219 Atherosclerosis of native arteries of extremities with intermittent claudication, unspecified extremity: Secondary | ICD-10-CM | POA: Diagnosis not present

## 2020-11-11 DIAGNOSIS — Z880 Allergy status to penicillin: Secondary | ICD-10-CM

## 2020-11-11 DIAGNOSIS — I442 Atrioventricular block, complete: Secondary | ICD-10-CM | POA: Diagnosis not present

## 2020-11-11 DIAGNOSIS — Z8719 Personal history of other diseases of the digestive system: Secondary | ICD-10-CM

## 2020-11-11 DIAGNOSIS — R197 Diarrhea, unspecified: Secondary | ICD-10-CM | POA: Diagnosis not present

## 2020-11-11 DIAGNOSIS — K819 Cholecystitis, unspecified: Secondary | ICD-10-CM | POA: Diagnosis not present

## 2020-11-11 DIAGNOSIS — F17210 Nicotine dependence, cigarettes, uncomplicated: Secondary | ICD-10-CM | POA: Diagnosis present

## 2020-11-11 DIAGNOSIS — N184 Chronic kidney disease, stage 4 (severe): Secondary | ICD-10-CM | POA: Diagnosis not present

## 2020-11-11 DIAGNOSIS — E1140 Type 2 diabetes mellitus with diabetic neuropathy, unspecified: Secondary | ICD-10-CM | POA: Diagnosis not present

## 2020-11-11 DIAGNOSIS — E119 Type 2 diabetes mellitus without complications: Secondary | ICD-10-CM | POA: Diagnosis not present

## 2020-11-11 DIAGNOSIS — I5022 Chronic systolic (congestive) heart failure: Secondary | ICD-10-CM | POA: Diagnosis present

## 2020-11-11 DIAGNOSIS — E785 Hyperlipidemia, unspecified: Secondary | ICD-10-CM | POA: Diagnosis present

## 2020-11-11 DIAGNOSIS — Z833 Family history of diabetes mellitus: Secondary | ICD-10-CM

## 2020-11-11 DIAGNOSIS — N179 Acute kidney failure, unspecified: Secondary | ICD-10-CM

## 2020-11-11 LAB — COMPREHENSIVE METABOLIC PANEL
ALT: 14 U/L (ref 0–44)
AST: 19 U/L (ref 15–41)
Albumin: 2.5 g/dL — ABNORMAL LOW (ref 3.5–5.0)
Alkaline Phosphatase: 110 U/L (ref 38–126)
Anion gap: 9 (ref 5–15)
BUN: 34 mg/dL — ABNORMAL HIGH (ref 8–23)
CO2: 20 mmol/L — ABNORMAL LOW (ref 22–32)
Calcium: 8.9 mg/dL (ref 8.9–10.3)
Chloride: 110 mmol/L (ref 98–111)
Creatinine, Ser: 2.53 mg/dL — ABNORMAL HIGH (ref 0.44–1.00)
GFR, Estimated: 20 mL/min — ABNORMAL LOW (ref >60)
Glucose, Bld: 118 mg/dL — ABNORMAL HIGH (ref 70–99)
Potassium: 4.2 mmol/L (ref 3.5–5.1)
Sodium: 139 mmol/L (ref 135–145)
Total Bilirubin: 3.4 mg/dL — ABNORMAL HIGH (ref 0.3–1.2)
Total Protein: 6.1 g/dL — ABNORMAL LOW (ref 6.5–8.1)

## 2020-11-11 LAB — CBC WITH DIFFERENTIAL/PLATELET
Abs Immature Granulocytes: 0.05 10*3/uL (ref 0.00–0.07)
Basophils Absolute: 0 10*3/uL (ref 0.0–0.1)
Basophils Relative: 0 %
Eosinophils Absolute: 0.1 10*3/uL (ref 0.0–0.5)
Eosinophils Relative: 2 %
HCT: 32.9 % — ABNORMAL LOW (ref 36.0–46.0)
Hemoglobin: 10.6 g/dL — ABNORMAL LOW (ref 12.0–15.0)
Immature Granulocytes: 1 %
Lymphocytes Relative: 16 %
Lymphs Abs: 1.2 10*3/uL (ref 0.7–4.0)
MCH: 28.7 pg (ref 26.0–34.0)
MCHC: 32.2 g/dL (ref 30.0–36.0)
MCV: 89.2 fL (ref 80.0–100.0)
Monocytes Absolute: 1 10*3/uL (ref 0.1–1.0)
Monocytes Relative: 14 %
Neutro Abs: 5 10*3/uL (ref 1.7–7.7)
Neutrophils Relative %: 67 %
Platelets: 158 10*3/uL (ref 150–400)
RBC: 3.69 MIL/uL — ABNORMAL LOW (ref 3.87–5.11)
RDW: 17.1 % — ABNORMAL HIGH (ref 11.5–15.5)
WBC: 7.4 10*3/uL (ref 4.0–10.5)
nRBC: 0 % (ref 0.0–0.2)

## 2020-11-11 LAB — RESP PANEL BY RT-PCR (FLU A&B, COVID) ARPGX2
Influenza A by PCR: NEGATIVE
Influenza B by PCR: NEGATIVE
SARS Coronavirus 2 by RT PCR: NEGATIVE

## 2020-11-11 LAB — LIPASE, BLOOD: Lipase: 49 U/L (ref 11–51)

## 2020-11-11 MED ORDER — RIVAROXABAN 15 MG PO TABS
15.0000 mg | ORAL_TABLET | Freq: Every day | ORAL | Status: DC
  Administered 2020-11-12 – 2020-11-14 (×3): 15 mg via ORAL
  Filled 2020-11-11 (×3): qty 1

## 2020-11-11 MED ORDER — ACETAMINOPHEN 650 MG RE SUPP: 650.0000 mg | Freq: Four times a day (QID) | RECTAL | Status: DC | PRN

## 2020-11-11 MED ORDER — ENOXAPARIN SODIUM 30 MG/0.3ML ~~LOC~~ SOLN
30.0000 mg | SUBCUTANEOUS | Status: DC
  Administered 2020-11-11: 30 mg via SUBCUTANEOUS
  Filled 2020-11-11: qty 0.3

## 2020-11-11 MED ORDER — SODIUM CHLORIDE 0.9% FLUSH
3.0000 mL | Freq: Two times a day (BID) | INTRAVENOUS | Status: DC
  Administered 2020-11-11 – 2020-11-14 (×6): 3 mL via INTRAVENOUS

## 2020-11-11 MED ORDER — SODIUM BICARBONATE 650 MG PO TABS
650.0000 mg | ORAL_TABLET | Freq: Three times a day (TID) | ORAL | Status: DC
  Administered 2020-11-11 – 2020-11-13 (×7): 650 mg via ORAL
  Filled 2020-11-11 (×8): qty 1

## 2020-11-11 MED ORDER — ONDANSETRON HCL 4 MG PO TABS: 4.0000 mg | ORAL_TABLET | Freq: Four times a day (QID) | ORAL | Status: DC | PRN

## 2020-11-11 MED ORDER — GABAPENTIN 100 MG PO CAPS
100.0000 mg | ORAL_CAPSULE | Freq: Once | ORAL | Status: AC
  Administered 2020-11-11: 100 mg via ORAL
  Filled 2020-11-11: qty 1

## 2020-11-11 MED ORDER — HYDROCERIN EX CREA
TOPICAL_CREAM | Freq: Two times a day (BID) | CUTANEOUS | Status: DC
  Administered 2020-11-12: 1 via TOPICAL
  Filled 2020-11-11: qty 113

## 2020-11-11 MED ORDER — INSULIN ASPART 100 UNIT/ML ~~LOC~~ SOLN
0.0000 [IU] | Freq: Three times a day (TID) | SUBCUTANEOUS | Status: DC
  Administered 2020-11-12 – 2020-11-13 (×2): 2 [IU] via SUBCUTANEOUS
  Administered 2020-11-13 – 2020-11-14 (×2): 1 [IU] via SUBCUTANEOUS

## 2020-11-11 MED ORDER — HYDROXYZINE HCL 25 MG PO TABS
25.0000 mg | ORAL_TABLET | Freq: Once | ORAL | Status: AC
  Administered 2020-11-11: 25 mg via ORAL
  Filled 2020-11-11: qty 1

## 2020-11-11 MED ORDER — ENOXAPARIN SODIUM 30 MG/0.3ML ~~LOC~~ SOLN: 30.0000 mg | SUBCUTANEOUS | Status: DC

## 2020-11-11 MED ORDER — IRBESARTAN 150 MG PO TABS
150.0000 mg | ORAL_TABLET | Freq: Every day | ORAL | Status: DC
  Administered 2020-11-12 – 2020-11-14 (×3): 150 mg via ORAL
  Filled 2020-11-11 (×3): qty 1

## 2020-11-11 MED ORDER — SODIUM CHLORIDE 0.9 % IV BOLUS
500.0000 mL | Freq: Once | INTRAVENOUS | Status: AC
Start: 2020-11-11 — End: 2020-11-11
  Administered 2020-11-11: 500 mL via INTRAVENOUS

## 2020-11-11 MED ORDER — ACETAMINOPHEN 325 MG PO TABS: 650.0000 mg | ORAL_TABLET | Freq: Four times a day (QID) | ORAL | Status: DC | PRN

## 2020-11-11 MED ORDER — HYDROXYZINE HCL 25 MG PO TABS: 25.0000 mg | ORAL_TABLET | Freq: Three times a day (TID) | ORAL | Status: DC | PRN

## 2020-11-11 MED ORDER — PANTOPRAZOLE SODIUM 40 MG PO TBEC: 40.0000 mg | DELAYED_RELEASE_TABLET | Freq: Every day | ORAL | Status: DC

## 2020-11-11 MED ORDER — TECHNETIUM TC 99M MEBROFENIN IV KIT: 7.4700 | PACK | Freq: Once | INTRAVENOUS | Status: DC | PRN

## 2020-11-11 MED ORDER — ONDANSETRON HCL 4 MG/2ML IJ SOLN
4.0000 mg | Freq: Four times a day (QID) | INTRAMUSCULAR | Status: DC | PRN
  Administered 2020-11-12 – 2020-11-14 (×5): 4 mg via INTRAVENOUS
  Filled 2020-11-11 (×5): qty 2

## 2020-11-11 MED ORDER — TECHNETIUM TC 99M MEBROFENIN IV KIT
7.4700 | PACK | Freq: Once | INTRAVENOUS | Status: AC | PRN
  Administered 2020-11-11: 7.47 via INTRAVENOUS

## 2020-11-11 MED ORDER — CALCITRIOL 0.25 MCG PO CAPS: 0.2500 ug | ORAL_CAPSULE | Freq: Every day | ORAL | Status: DC

## 2020-11-11 MED ORDER — HYDROXYZINE HCL 10 MG PO TABS
10.0000 mg | ORAL_TABLET | Freq: Two times a day (BID) | ORAL | Status: DC | PRN
  Filled 2020-11-11: qty 1

## 2020-11-11 MED ORDER — CARVEDILOL 12.5 MG PO TABS
12.5000 mg | ORAL_TABLET | Freq: Two times a day (BID) | ORAL | Status: DC
  Administered 2020-11-11 – 2020-11-14 (×7): 12.5 mg via ORAL
  Filled 2020-11-11 (×7): qty 1

## 2020-11-11 MED ORDER — ALBUTEROL SULFATE (2.5 MG/3ML) 0.083% IN NEBU: 2.5000 mg | INHALATION_SOLUTION | Freq: Four times a day (QID) | RESPIRATORY_TRACT | Status: DC | PRN

## 2020-11-11 MED ORDER — ATORVASTATIN CALCIUM 40 MG PO TABS
40.0000 mg | ORAL_TABLET | Freq: Every day | ORAL | Status: DC
  Administered 2020-11-12 – 2020-11-14 (×3): 40 mg via ORAL
  Filled 2020-11-11 (×3): qty 1

## 2020-11-11 MED ORDER — ISOSORB DINITRATE-HYDRALAZINE 20-37.5 MG PO TABS
1.0000 | ORAL_TABLET | Freq: Three times a day (TID) | ORAL | Status: DC
  Administered 2020-11-11 – 2020-11-14 (×7): 1 via ORAL
  Filled 2020-11-11 (×10): qty 1

## 2020-11-11 NOTE — ED Notes (Signed)
Transported to u/s

## 2020-11-11 NOTE — Progress Notes (Signed)
11/11/2020 Patient transfer from the emergency room to 2W26 at 1745. She is alert, oriented to person, place, time and situation. Patient does not have any open wound, but small discoloration on upper back. She has a old left BKA and a pacemaker. Telemetry box was placed on patient she is V-pacing and central monitor was made aware of patient arrival to the unit. [facility] RN.
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
## 2020-11-11 NOTE — ED Notes (Signed)
Returned from CT.
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
## 2020-11-11 NOTE — ED Notes (Signed)
Taken to xray for hepabil. scan

## 2020-11-11 NOTE — ED Notes (Signed)
Patient transported to CT scan . 

## 2020-11-11 NOTE — ED Notes (Signed)
Report obtained. Care assumed at this time. Pt is in [place] at this time for Hepatic study.

## 2020-11-11 NOTE — ED Notes (Addendum)
Pt returned from [facility] to room 39. Transport tech states that pt has been cleared of NPO status.

## 2020-11-11 NOTE — ED Triage Notes (Signed)
Patient c/o itching on her back, buttocks and thighs x 1 month was seen by her PCP on Wed. And given a cream to apply to her skin however states it is not helping and her skin hurts.

## 2020-11-12 ENCOUNTER — Inpatient Hospital Stay (HOSPITAL_COMMUNITY): Payer: Medicare Other

## 2020-11-12 DIAGNOSIS — E063 Autoimmune thyroiditis: Secondary | ICD-10-CM | POA: Diagnosis not present

## 2020-11-12 DIAGNOSIS — K810 Acute cholecystitis: Secondary | ICD-10-CM | POA: Diagnosis not present

## 2020-11-12 DIAGNOSIS — L299 Pruritus, unspecified: Secondary | ICD-10-CM | POA: Diagnosis not present

## 2020-11-12 DIAGNOSIS — E039 Hypothyroidism, unspecified: Secondary | ICD-10-CM | POA: Diagnosis not present

## 2020-11-12 LAB — GLUCOSE, CAPILLARY
Glucose-Capillary: 114 mg/dL — ABNORMAL HIGH (ref 70–99)
Glucose-Capillary: 86 mg/dL (ref 70–99)
Glucose-Capillary: 189 mg/dL — ABNORMAL HIGH (ref 70–99)
Glucose-Capillary: 121 mg/dL — ABNORMAL HIGH (ref 70–99)

## 2020-11-12 LAB — T4, FREE: Free T4: 2.52 ng/dL — ABNORMAL HIGH (ref 0.61–1.12)

## 2020-11-12 LAB — CBC
HCT: 29.2 % — ABNORMAL LOW (ref 36.0–46.0)
Hemoglobin: 10 g/dL — ABNORMAL LOW (ref 12.0–15.0)
MCH: 29.6 pg (ref 26.0–34.0)
MCHC: 34.2 g/dL (ref 30.0–36.0)
MCV: 86.4 fL (ref 80.0–100.0)
Platelets: 149 10*3/uL — ABNORMAL LOW (ref 150–400)
RBC: 3.38 MIL/uL — ABNORMAL LOW (ref 3.87–5.11)
RDW: 17 % — ABNORMAL HIGH (ref 11.5–15.5)
WBC: 5.9 10*3/uL (ref 4.0–10.5)
nRBC: 0.3 % — ABNORMAL HIGH (ref 0.0–0.2)

## 2020-11-12 LAB — BILIRUBIN, DIRECT: Bilirubin, Direct: 1.1 mg/dL — ABNORMAL HIGH (ref 0.0–0.2)

## 2020-11-12 LAB — COMPREHENSIVE METABOLIC PANEL
ALT: 14 U/L (ref 0–44)
AST: 22 U/L (ref 15–41)
Albumin: 2.3 g/dL — ABNORMAL LOW (ref 3.5–5.0)
Alkaline Phosphatase: 101 U/L (ref 38–126)
Anion gap: 9 (ref 5–15)
BUN: 35 mg/dL — ABNORMAL HIGH (ref 8–23)
CO2: 18 mmol/L — ABNORMAL LOW (ref 22–32)
Calcium: 8.4 mg/dL — ABNORMAL LOW (ref 8.9–10.3)
Chloride: 112 mmol/L — ABNORMAL HIGH (ref 98–111)
Creatinine, Ser: 2.48 mg/dL — ABNORMAL HIGH (ref 0.44–1.00)
GFR, Estimated: 21 mL/min — ABNORMAL LOW (ref >60)
Glucose, Bld: 141 mg/dL — ABNORMAL HIGH (ref 70–99)
Potassium: 3.6 mmol/L (ref 3.5–5.1)
Sodium: 139 mmol/L (ref 135–145)
Total Bilirubin: 2.5 mg/dL — ABNORMAL HIGH (ref 0.3–1.2)
Total Protein: 5.5 g/dL — ABNORMAL LOW (ref 6.5–8.1)

## 2020-11-12 LAB — HEMOGLOBIN A1C
Hgb A1c MFr Bld: 5 % (ref 4.8–5.6)
Mean Plasma Glucose: 96.8 mg/dL

## 2020-11-12 LAB — TSH: TSH: <0.01 u[IU]/mL — ABNORMAL LOW (ref 0.350–4.500)

## 2020-11-12 LAB — HIV ANTIBODY (ROUTINE TESTING W REFLEX): HIV Screen 4th Generation wRfx: NONREACTIVE

## 2020-11-12 LAB — LACTATE DEHYDROGENASE: LDH: 149 U/L (ref 98–192)

## 2020-11-12 MED ORDER — ALBUTEROL SULFATE HFA 108 (90 BASE) MCG/ACT IN AERS
2.0000 | INHALATION_SPRAY | Freq: Four times a day (QID) | RESPIRATORY_TRACT | Status: DC
  Administered 2020-11-12 – 2020-11-14 (×7): 2 via RESPIRATORY_TRACT
  Filled 2020-11-12: qty 6.7

## 2020-11-12 MED ORDER — LOPERAMIDE HCL 2 MG PO CAPS
2.0000 mg | ORAL_CAPSULE | ORAL | Status: DC | PRN
  Administered 2020-11-12 – 2020-11-14 (×4): 2 mg via ORAL
  Filled 2020-11-12 (×5): qty 1

## 2020-11-12 MED ORDER — HYDROXYZINE HCL 10 MG PO TABS
10.0000 mg | ORAL_TABLET | Freq: Two times a day (BID) | ORAL | Status: DC | PRN
  Administered 2020-11-12 – 2020-11-13 (×2): 10 mg via ORAL
  Filled 2020-11-12 (×2): qty 1

## 2020-11-12 NOTE — Progress Notes (Signed)
HD#1 Subjective:  Overnight Events: None  This morning, [name] [name] was evaluated at bedside. She reports feeling alright but tired. Endorsing ongoing diarrhea but also endorses only having 1-2 bowel movements per day; some day none. She is a poor historian. Patient very drowsy during exam.    Objective:  Vital signs in last 24 hours: Vitals:   11/11/20 1700 11/11/20 1741 11/11/20 2054 11/12/20 0500  BP: (!) 157/72 139/71 128/74   Pulse: 70 70 71   Resp: (!) '26 19 18   '$ Temp:  (!) 97.4 F (36.3 C) 97.7 F (36.5 C)   TempSrc:   Oral   SpO2: 99% 100% 100%   Weight:    60.7 kg  Height:       Supplemental O2: Room Air SpO2: 100 %   Physical Exam:  Physical Exam Constitutional:      Appearance: Normal appearance.  Neck:     Thyroid: No thyroid mass, thyromegaly or thyroid tenderness.     Comments: No bruit Cardiovascular:     Rate and Rhythm: Normal rate and regular rhythm.     Pulses: Normal pulses.     Heart sounds: Normal heart sounds.  Pulmonary:     Effort: Pulmonary effort is normal.     Breath sounds: Normal breath sounds.  Abdominal:     General: Abdomen is flat. Bowel sounds are normal.     Palpations: Abdomen is soft.  Skin:    General: Skin is warm and dry.  Neurological:     General: No focal deficit present.     Mental Status: She is alert and oriented to person, place, and time.     Filed Weights   11/11/20 0331 11/12/20 0500  Weight: 54.4 kg 60.7 kg    No intake or output data in the 24 hours ending 11/12/20 0630 Net IO Since Admission: No IO data has been entered for this period [11/12/20 0630]  Pertinent Labs: CBC Latest Ref Rng & Units 11/12/2020 11/11/2020 07/16/2020  WBC 4.0 - 10.5 K/uL 5.9 7.4 5.0  Hemoglobin 12.0 - 15.0 g/dL 10.0(L) 10.6(L) 10.1(L)  Hematocrit 36.0 - 46.0 % 29.2(L) 32.9(L) 32.2(L)  Platelets 150 - 400 K/uL 149(L) 158 129(L)    CMP Latest Ref Rng & Units 11/12/2020 11/11/2020 11/08/2020  Glucose 70 - 99 mg/dL 141(H)  118(H) 99  BUN 8 - 23 mg/dL 35(H) 34(H) 21  Creatinine 0.44 - 1.00 mg/dL 2.48(H) 2.53(H) 2.06(H)  Sodium 135 - 145 mmol/L 139 139 142  Potassium 3.5 - 5.1 mmol/L 3.6 4.2 3.7  Chloride 98 - 111 mmol/L 112(H) 110 108(H)  CO2 22 - 32 mmol/L 18(L) 20(L) 18(L)  Calcium 8.9 - 10.3 mg/dL 8.4(L) 8.9 9.1  Total Protein 6.5 - 8.1 g/dL 5.5(L) 6.1(L) 6.2  Total Bilirubin 0.3 - 1.2 mg/dL 2.5(H) 3.4(H) 3.1(H)  Alkaline Phos 38 - 126 U/L 101 110 135(H)  AST 15 - 41 U/L '22 19 17  '$ ALT 0 - 44 U/L '14 14 12    '$ Imaging: CT Abdomen Pelvis Wo Contrast  IMPRESSION: 1. No acute intra-abdominal process. 2. Cholelithiasis. 3. Small right greater than left pleural effusions. Trace ascites. Diffuse anasarca. 4. Aortic Atherosclerosis (ICD10-I70.0). Electronically Signed   By: Titus Dubin M.D.   On: 11/11/2020 07:49   NM Hepatobiliary Liver Func  Result Date: 11/11/2020 CLINICAL DATA:  Elevated total bilirubin. EXAM: NUCLEAR MEDICINE HEPATOBILIARY IMAGING TECHNIQUE: Sequential images of the abdomen were obtained out to 60 minutes following intravenous administration of radiopharmaceutical. RADIOPHARMACEUTICALS:  7.47 mCi Tc-36m Choletec IV COMPARISON:  Current ultrasound, which demonstrates gallstones. FINDINGS: Prompt uptake and biliary excretion of activity by the liver is seen. Gallbladder activity is visualized, consistent with patency of cystic duct. Biliary activity passes into small bowel, consistent with patent common bile duct. IMPRESSION: 1. Normal exam.  Specifically, patent cystic and common bile ducts. Electronically Signed   By: DLajean ManesM.D.   On: 11/11/2020 17:14   DG Chest Portable 1 View IMPRESSION: Chronic cardiomegaly with mild or developing pulmonary interstitial edema suspected, less pronounced than that in O10/22/2024 Electronically Signed   By: HGenevie AnnM.D.   On: 11/11/2020 07:49   [place]domen Limited RUQ (LIVER/GB)  IMPRESSION: 1. Cholelithiasis with gallbladder wall thickened. Note that  ascites can cause gallbladder wall thickening. Gallbladder wall does appear subtly edematous, however. This finding raises concern for potential degree of acute cholecystitis. In this regard, it may be reasonable to consider nuclear medicine hepatobiliary imaging study to assess for cystic duct patency. Equivocal pericholecystic fluid versus mild ascites in the gallbladder fossa region. 2.  Right pleural effusion. 3.  Mild ascites.   Assessment/Plan:   Active Problems:   Acute cholecystitis  Patient Summary:   [name] MOleeta Lovejoyis a 70year old female with PMHx of  CKD stage IV, CAD, HFrEF of 20-25% s/p pacemaker, paroxysmal atrial fibrillation, DM, hyperlipdemia, hypertension, left BKA,  presenting for evaluation of itching admitted for pruritis secondary to hyperthyroidism.  Generalized Pruritus  Hyperthyroidism  Patient reports continued itching today. 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 TSH overnight <0.01, T4 elevated. TSH wnl previously in 2016  Pruritus can be rare manifestation of TSH. Neck exam without thyromegaly or obvious mass.  Will workup cause of hyperthyroidism.  - continue hydroxyzine  - Eucerin cream  - follow up T3 - thyroid antibodies ordered - thyroid [place]- monitor CMP, CBC   CKD stage IV Cr. 2.53, baseline ~2.4-2.6.  -Monitor renal function -continue sodium bicarb 650 mg 3 times daily  CAD HFrEF  Patient with ED of 20-25% with a pacemaker. Seen by Dr. TLovena Ledeferred biventricular pacemaker despite worsening heart failure as she is not having significant symptoms. Does not appear volume overloaded on exam.  -Continue on home coreg, bidil, irbesartan -monitor I/Os -Monitory electrolytes   Paroxsymal atrial fibrillation  Rate controlled on coreg 12.5. Currently on Xarelto 15 mg daily.  -Continue home medications  Hypertension Hyperlipidemia -Continue coreg, bidil,  valsartan  Diabetes Mellitus On Jardiance at home.  -monitor CBGs  Cholelithiasis  Patient is asymptomatic without abdominal pain. Afebrile with stable vitals. HIDA scan without obstruction. T abdomen and abdomen [place] cholelithiasis with mild gallbladder wall thickening but no ductal dilation.Do not think she has acute cholecystitis at this time.   Diet: heart healthy, carb modified  Fluids: none DVT: Xarelto Code: Full  Dispo: Anticipated discharge to Home in 2 days pending further management. .[name] MD 11/12/2020, 6:30 AM Pager: [phone] Please contact the on call pager after 5 pm and on weekends at [phone]

## 2020-11-12 NOTE — Progress Notes (Signed)
Spoke with physician concerning frequent loose stools that patient reports have been going on all day. New order for Immodium will be placed. Will notify for any changes.

## 2020-11-12 NOTE — Progress Notes (Signed)
  Date: 11/12/2020  Patient name: [name] [name]  Medical record number: [id number]  Date of birth: 1951/07/29   I have seen and evaluated Demetrius Charity and discussed their care with the Residency Team.  In brief, patient is a [age]-year-old female with a past medical history of CKD stage IV, CAD, chronic systolic heart failure with EF 20 to 25% status post pacemaker placement, paroxysmal A. fib, diabetes mellitus type 2, hyperlipidemia, hypertension, left BKA who presented to the ED with worsening pruritus.  Patient states that she has had intermittent episodes of severe pruritus.  States that initial episodes the pruritus improved with hydroxyzine.  Her latest episode of pruritus started approximately 3 days ago when she noted generalized pruritus which is worse on her buttocks and her legs.  Patient was seen in [facility] for this and was started on hydroxyzine capsaicin cream which did not help.  Patient presented to the ED given her persistent pruritus.  No chest pain, no shortness of breath, palpitations, lightheadedness, syncope, no nausea or vomiting, no abdominal pain, no fevers or chills.  Patient does complain of some episodes of intermittent diarrhea but states that her last bowel movement was a day ago and that she normally has only 1 bowel movement a day or every other day.  Today, patient states that she still has persistent pruritus and would like to go home soon.   PMHx, Fam Hx, and/or Soc Hx : As per resident admit note  Vitals:   11/11/20 2054 11/12/20 1416  BP: 128/74 106/62  Pulse: 71 68  Resp: 18 20  Temp: 97.7 F (36.5 C) 98.6 F (37 C)  SpO2: 100% 99%   General: Awake, alert, x3, NAD CVS: Regular rhythm, normal heart sounds Lungs: CTA bilaterally Abdomen: Soft, nontender, nondistended, normoactive bowel sounds Extremities: No edema noted, nontender to palpation, left BKA noted Psych: Flat affect HEENT: Normocephalic, atraumatic Skin: Warm and dry Thyroid: No  thyroid mass palpable, no thyroid bruit was appreciated on auscultation  Assessment and Plan: I have seen and evaluated the patient as outlined above. I agree with the formulated Assessment and Plan as detailed in the residents' note, with the following changes:   1.  Hyperthyroidism: -Patient presented to the ED with generalized pruritus and was found to have hyperthyroidism on blood work.  Patient does not have any signs or symptoms of overt hyperthyroidism.  However, her TSH is less than 0.01 and her free T4 is approximately 2.5 consistent with hyperthyroidism -Patient thyroid ultrasound did not show any nodules of concern that require biopsy or follow-up -We will follow-up thyroid receptor antibody for possible Graves' disease -Patient also had a radioactive iodine uptake scan that was ordered.  We will follow up results -I suspect that the patient likely has Graves' disease given the severity of her hyperthyroidism noted on her blood work but she has no signs of overt hyperthyroidism. -We will start patient on methimazole if further work-up is consistent with Graves' disease.  We will need to rule out autoimmune thyroiditis as well. -Continue with Eucerin cream and hydroxyzine for her pruritus. -No further work-up at this time.  We will continue to monitor closely  Aldine Contes, MD 2/14/20224:23 PM

## 2020-11-13 DIAGNOSIS — I10 Essential (primary) hypertension: Secondary | ICD-10-CM

## 2020-11-13 DIAGNOSIS — N184 Chronic kidney disease, stage 4 (severe): Secondary | ICD-10-CM

## 2020-11-13 DIAGNOSIS — E119 Type 2 diabetes mellitus without complications: Secondary | ICD-10-CM

## 2020-11-13 DIAGNOSIS — I2510 Atherosclerotic heart disease of native coronary artery without angina pectoris: Secondary | ICD-10-CM | POA: Diagnosis not present

## 2020-11-13 DIAGNOSIS — E063 Autoimmune thyroiditis: Secondary | ICD-10-CM | POA: Diagnosis not present

## 2020-11-13 DIAGNOSIS — I480 Paroxysmal atrial fibrillation: Secondary | ICD-10-CM

## 2020-11-13 DIAGNOSIS — L299 Pruritus, unspecified: Secondary | ICD-10-CM | POA: Diagnosis not present

## 2020-11-13 DIAGNOSIS — K810 Acute cholecystitis: Secondary | ICD-10-CM | POA: Diagnosis not present

## 2020-11-13 DIAGNOSIS — E039 Hypothyroidism, unspecified: Secondary | ICD-10-CM | POA: Diagnosis not present

## 2020-11-13 LAB — THYROID ANTIBODIES
Thyroglobulin Antibody: <1 IU/mL (ref 0.0–0.9)
Thyroperoxidase Ab SerPl-aCnc: 39 IU/mL — ABNORMAL HIGH (ref 0–34)

## 2020-11-13 LAB — T3: T3, Total: 93 ng/dL (ref 71–180)

## 2020-11-13 LAB — CBC
HCT: 26.7 % — ABNORMAL LOW (ref 36.0–46.0)
Hemoglobin: 9.1 g/dL — ABNORMAL LOW (ref 12.0–15.0)
MCH: 29.5 pg (ref 26.0–34.0)
MCHC: 34.1 g/dL (ref 30.0–36.0)
MCV: 86.7 fL (ref 80.0–100.0)
Platelets: 121 10*3/uL — ABNORMAL LOW (ref 150–400)
RBC: 3.08 MIL/uL — ABNORMAL LOW (ref 3.87–5.11)
RDW: 16.8 % — ABNORMAL HIGH (ref 11.5–15.5)
WBC: 5.6 10*3/uL (ref 4.0–10.5)
nRBC: 0 % (ref 0.0–0.2)

## 2020-11-13 LAB — BASIC METABOLIC PANEL
Anion gap: 9 (ref 5–15)
BUN: 41 mg/dL — ABNORMAL HIGH (ref 8–23)
CO2: 19 mmol/L — ABNORMAL LOW (ref 22–32)
Calcium: 7.9 mg/dL — ABNORMAL LOW (ref 8.9–10.3)
Chloride: 112 mmol/L — ABNORMAL HIGH (ref 98–111)
Creatinine, Ser: 2.65 mg/dL — ABNORMAL HIGH (ref 0.44–1.00)
GFR, Estimated: 19 mL/min — ABNORMAL LOW (ref >60)
Glucose, Bld: 137 mg/dL — ABNORMAL HIGH (ref 70–99)
Potassium: 3.4 mmol/L — ABNORMAL LOW (ref 3.5–5.1)
Sodium: 140 mmol/L (ref 135–145)

## 2020-11-13 LAB — THYROTROPIN RECEPTOR AUTOABS: Thyrotropin Receptor Ab: <1.1 IU/L (ref 0.00–1.75)

## 2020-11-13 LAB — HEPATIC FUNCTION PANEL
ALT: 11 U/L (ref 0–44)
AST: 19 U/L (ref 15–41)
Albumin: 2 g/dL — ABNORMAL LOW (ref 3.5–5.0)
Alkaline Phosphatase: 85 U/L (ref 38–126)
Bilirubin, Direct: 0.8 mg/dL — ABNORMAL HIGH (ref 0.0–0.2)
Indirect Bilirubin: 1 mg/dL — ABNORMAL HIGH (ref 0.3–0.9)
Total Bilirubin: 1.8 mg/dL — ABNORMAL HIGH (ref 0.3–1.2)
Total Protein: 4.7 g/dL — ABNORMAL LOW (ref 6.5–8.1)

## 2020-11-13 LAB — GLUCOSE, CAPILLARY
Glucose-Capillary: 92 mg/dL (ref 70–99)
Glucose-Capillary: 148 mg/dL — ABNORMAL HIGH (ref 70–99)
Glucose-Capillary: 170 mg/dL — ABNORMAL HIGH (ref 70–99)
Glucose-Capillary: 92 mg/dL (ref 70–99)

## 2020-11-13 LAB — HAPTOGLOBIN: Haptoglobin: 110 mg/dL (ref 37–355)

## 2020-11-13 MED ORDER — DIPHENHYDRAMINE HCL 25 MG PO CAPS
50.0000 mg | ORAL_CAPSULE | Freq: Every day | ORAL | Status: DC
  Administered 2020-11-13: 50 mg via ORAL
  Filled 2020-11-13: qty 2

## 2020-11-13 MED ORDER — POTASSIUM CHLORIDE 20 MEQ PO PACK
40.0000 meq | PACK | Freq: Once | ORAL | Status: AC
  Administered 2020-11-13: 40 meq via ORAL
  Filled 2020-11-13: qty 2

## 2020-11-13 NOTE — Progress Notes (Addendum)
HD#2 Subjective:  Overnight Events: None  This morning continues to have diffuse itching in particularly in her back and buttocks. Sitting up in bed. Does not feel that the hydroxyzine has helped with itching. Discussed current plan for workup for hyperthyroidism and plan for outpatient dermatology referal to which she is agreeable. Would like to be discharge tomorrow afternoon so she can arrange a ride.   Objective:  Vital signs in last 24 hours: Vitals:   11/12/20 2012 11/12/20 2035 11/13/20 0500 11/13/20 0511  BP: (!) 94/51 (!) 99/52  (!) 100/58  Pulse: 70 65  70  Resp: 20   18  Temp: 97.8 F (36.6 C)   98.4 F (36.9 C)  TempSrc: Oral   Oral  SpO2: 97%   100%  Weight:   63.4 kg   Height:       Supplemental O2: Room Air SpO2: 100 %   Physical Exam:  Physical Exam Constitutional:      Appearance: Normal appearance.  Neck:     Thyroid: No thyroid mass, thyromegaly or thyroid tenderness.     Comments: No bruit Cardiovascular:     Rate and Rhythm: Normal rate and regular rhythm.     Pulses: Normal pulses.     Heart sounds: Normal heart sounds.  Pulmonary:     Effort: Pulmonary effort is normal.     Breath sounds: Normal breath sounds.  Abdominal:     General: Abdomen is flat. Bowel sounds are normal.     Palpations: Abdomen is soft.  Skin:    General: Skin is warm and dry.     Comments: Excoriation of the sacral area and upper back. No obvious rashes   Neurological:     General: No focal deficit present.     Mental Status: She is alert and oriented to person, place, and time.     Filed Weights   11/11/20 0331 11/12/20 0500 11/13/20 0500  Weight: 54.4 kg 60.7 kg 63.4 kg     Intake/Output Summary (Last 24 hours) at 11/13/2020 1159 Last data filed at 11/12/2020 1900 Gross per 24 hour  Intake --  Output 1 ml  Net -1 ml   Net IO Since Admission: -1 mL [11/13/20 1159]  Pertinent Labs: CBC Latest Ref Rng & Units 11/13/2020 11/12/2020 11/11/2020  WBC 4.0 -  10.5 K/uL 5.6 5.9 7.4  Hemoglobin 12.0 - 15.0 g/dL 9.1(L) 10.0(L) 10.6(L)  Hematocrit 36.0 - 46.0 % 26.7(L) 29.2(L) 32.9(L)  Platelets 150 - 400 K/uL 121(L) 149(L) 158    CMP Latest Ref Rng & Units 11/13/2020 11/12/2020 11/11/2020  Glucose 70 - 99 mg/dL 137(H) 141(H) 118(H)  BUN 8 - 23 mg/dL 41(H) 35(H) 34(H)  Creatinine 0.44 - 1.00 mg/dL 2.65(H) 2.48(H) 2.53(H)  Sodium 135 - 145 mmol/L 140 139 139  Potassium 3.5 - 5.1 mmol/L 3.4(L) 3.6 4.2  Chloride 98 - 111 mmol/L 112(H) 112(H) 110  CO2 22 - 32 mmol/L 19(L) 18(L) 20(L)  Calcium 8.9 - 10.3 mg/dL 7.9(L) 8.4(L) 8.9  Total Protein 6.5 - 8.1 g/dL 4.7(L) 5.5(L) 6.1(L)  Total Bilirubin 0.3 - 1.2 mg/dL 1.8(H) 2.5(H) 3.4(H)  Alkaline Phos 38 - 126 U/L 85 101 110  AST 15 - 41 U/L '19 22 19  '$ ALT 0 - 44 U/L '11 14 14    '$ Imaging: CT Abdomen Pelvis Wo Contrast  IMPRESSION: 1. No acute intra-abdominal process. 2. Cholelithiasis. 3. Small right greater than left pleural effusions. Trace ascites. Diffuse anasarca. 4. Aortic Atherosclerosis (ICD10-I70.0). Electronically  Signed   By: Titus Dubin M.D.   On: 11/11/2020 07:49   NM Hepatobiliary Liver Func  Result Date: 11/11/2020 CLINICAL DATA:  Elevated total bilirubin. EXAM: NUCLEAR MEDICINE HEPATOBILIARY IMAGING TECHNIQUE: Sequential images of the abdomen were obtained out to 60 minutes following intravenous administration of radiopharmaceutical. RADIOPHARMACEUTICALS:  7.47 mCi Tc-3m Choletec IV COMPARISON:  Current ultrasound, which demonstrates gallstones. FINDINGS: Prompt uptake and biliary excretion of activity by the liver is seen. Gallbladder activity is visualized, consistent with patency of cystic duct. Biliary activity passes into small bowel, consistent with patent common bile duct. IMPRESSION: 1. Normal exam.  Specifically, patent cystic and common bile ducts. Electronically Signed   By: DLajean ManesM.D.   On: 11/11/2020 17:14   DG Chest Portable 1 View IMPRESSION: Chronic cardiomegaly  with mild or developing pulmonary interstitial edema suspected, less pronounced than that in [id number] Electronically Signed   By: HGenevie AnnM.D.   On: 11/11/2020 07:49   UKoreaAbdomen Limited RUQ (LIVER/GB)  IMPRESSION: 1. Cholelithiasis with gallbladder wall thickened. Note that ascites can cause gallbladder wall thickening. Gallbladder wall does appear subtly edematous, however. This finding raises concern for potential degree of acute cholecystitis. In this regard, it may be reasonable to consider nuclear medicine hepatobiliary imaging study to assess for cystic duct patency. Equivocal pericholecystic fluid versus mild ascites in the gallbladder fossa region. 2.  Right pleural effusion. 3.  Mild ascites.   UKoreathyroid  IMPRESSION: Thyroid tissue is mildly heterogeneous with small nodules. Nodules do not meet criteria for biopsy or dedicated follow-up.  The above is in keeping with the ACR TI-RADS recommendations - J Am Coll Radiol 2017;14:587-595.  Assessment/Plan:   Active Problems:   Acute cholecystitis  Patient Summary:   [name] [name] Veenstrais a 70year old female with PMHx of  CKD stage IV, CAD, HFrEF of 20-25% s/p pacemaker, paroxysmal atrial fibrillation, DM, hyperlipdemia, hypertension, left BKA,  presenting for evaluation of itching admitted for pruritis found to have hyperthyroidism.  Hyperthyroidism  Patient with 2 episodes of diarrhea overnight. Denies any OTC supplements including biotin.  No overt signs of exophthalmos, thyroid bruit, or dermatological changes. TSH <0..01 with elevated T4, normal T3. TRab pending. UKoreathyroid with small nodules no requiring biopsy or dedicated follow up. Etiology of hyperthyroidism likely Graves vs autoimmunine thyroiditis.  -Radioiodine uptake study ordered.  -Follow up TFredoniato start on methimazole if workup consistent with Grave's disease  Generalized Pruritus  Patient reports continued itching today. Bili improved to 1.0. No rashes but  diffuse excoriations. No sign of biliary obstruction. May be related to thyrotoxicosis although this is rare presentation, this may improve with treatment if studies show Grave's disease. May benefit from outpatient dermatology follow up. - Benadryl 50 mg nightly - Eucerin cream   CKD stage IV Cr. 2.65, baseline ~2.4-2.6.  -Monitor renal function -continue sodium bicarb 650 mg 3 times daily  CAD HFrEF  Patient with ED of 20-25% with a pacemaker. Seen by Dr. TLovena Ledeferred biventricular pacemaker despite worsening heart failure as she is not having significant symptoms. Remains euvolemic on exam. -Continue on home coreg, bidil, irbesartan -monitor I/Os -Monitor electrolytes   Paroxsymal atrial fibrillation  Rate controlled on coreg 12.5. Currently on Xarelto 15 mg daily.  -Continue home medications  Hypertension Hyperlipidemia -Continue coreg, bidil, valsartan  Diabetes Mellitus On Jardiance at home.  -monitor CBGs  Cholelithiasis  Patient is asymptomatic without abdominal pain. Afebrile with stable vitals. HIDA scan without  obstruction. T abdomen and abdomen US with cholelithiasis with mild gallbladder wall thickening but no ductal dilation.Do not think she has acute cholecystitis at this time.   Diet: heart healthy, carb modified  Fluids: none DVT: Xarelto Code: Full  Dispo: Anticipated discharge to Home in tomorrow.   [name], MD 11/13/2020, 11:59 AM Pager: [phone]  Please contact the on call pager after 5 pm and on weekends at [phone].

## 2020-11-13 NOTE — Progress Notes (Incomplete)
HD#2 Subjective:  Overnight Events: None   Objective:  Vital signs in last 24 hours: Vitals:   11/12/20 1416 11/12/20 2012 11/12/20 2035 11/13/20 0511  BP: 106/62 (!) 94/51 (!) 99/52 (!) 100/58  Pulse: 68 70 65 70  Resp: '20 20  18  '$ Temp: 98.6 F (37 C) 97.8 F (36.6 C)  98.4 F (36.9 C)  TempSrc:  Oral  Oral  SpO2: 99% 97%  100%  Weight:      Height:       Supplemental O2: Room Air SpO2: 100 %   Physical Exam:  Physical Exam Constitutional:      Appearance: Normal appearance.  Neck:     Thyroid: No thyroid mass, thyromegaly or thyroid tenderness.     Comments: No bruit Cardiovascular:     Rate and Rhythm: Normal rate and regular rhythm.     Pulses: Normal pulses.     Heart sounds: Normal heart sounds.  Pulmonary:     Effort: Pulmonary effort is normal.     Breath sounds: Normal breath sounds.  Abdominal:     General: Abdomen is flat. Bowel sounds are normal.     Palpations: Abdomen is soft.  Skin:    General: Skin is warm and dry.  Neurological:     General: No focal deficit present.     Mental Status: She is alert and oriented to person, place, and time.     Filed Weights   11/11/20 0331 11/12/20 0500  Weight: 54.4 kg 60.7 kg     Intake/Output Summary (Last 24 hours) at 11/13/2020 0615 Last data filed at 11/12/2020 1900 Gross per 24 hour  Intake -  Output 1 ml  Net -1 ml   Net IO Since Admission: -1 mL [11/13/20 0615]  Pertinent Labs: CBC Latest Ref Rng & Units 11/13/2020 11/12/2020 11/11/2020  WBC 4.0 - 10.5 K/uL 5.6 5.9 7.4  Hemoglobin 12.0 - 15.0 g/dL 9.1(L) 10.0(L) 10.6(L)  Hematocrit 36.0 - 46.0 % 26.7(L) 29.2(L) 32.9(L)  Platelets 150 - 400 K/uL 121(L) 149(L) 158    CMP Latest Ref Rng & Units 11/13/2020 11/12/2020 11/11/2020  Glucose 70 - 99 mg/dL 137(H) 141(H) 118(H)  BUN 8 - 23 mg/dL 41(H) 35(H) 34(H)  Creatinine 0.44 - 1.00 mg/dL 2.65(H) 2.48(H) 2.53(H)  Sodium 135 - 145 mmol/L 140 139 139  Potassium 3.5 - 5.1 mmol/L 3.4(L) 3.6 4.2   Chloride 98 - 111 mmol/L 112(H) 112(H) 110  CO2 22 - 32 mmol/L 19(L) 18(L) 20(L)  Calcium 8.9 - 10.3 mg/dL 7.9(L) 8.4(L) 8.9  Total Protein 6.5 - 8.1 g/dL - 5.5(L) 6.1(L)  Total Bilirubin 0.3 - 1.2 mg/dL - 2.5(H) 3.4(H)  Alkaline Phos 38 - 126 U/L - 101 110  AST 15 - 41 U/L - 22 19  ALT 0 - 44 U/L - 14 14    Imaging: CT Abdomen Pelvis Wo Contrast  IMPRESSION: 1. No acute intra-abdominal process. 2. Cholelithiasis. 3. Small right greater than left pleural effusions. Trace ascites. Diffuse anasarca. 4. Aortic Atherosclerosis (ICD10-I70.0). Electronically Signed   By: Titus Dubin M.D.   On: 11/11/2020 07:49   NM Hepatobiliary Liver Func  Result Date: 11/11/2020 CLINICAL DATA:  Elevated total bilirubin. EXAM: NUCLEAR MEDICINE HEPATOBILIARY IMAGING TECHNIQUE: Sequential images of the abdomen were obtained out to 60 minutes following intravenous administration of radiopharmaceutical. RADIOPHARMACEUTICALS:  7.47 mCi Tc-60m Choletec IV COMPARISON:  Current ultrasound, which demonstrates gallstones. FINDINGS: Prompt uptake and biliary excretion of activity by the liver is seen.  Gallbladder activity is visualized, consistent with patency of cystic duct. Biliary activity passes into small bowel, consistent with patent common bile duct. IMPRESSION: 1. Normal exam.  Specifically, patent cystic and common bile ducts. Electronically Signed   By: Lajean Manes M.D.   On: 11/11/2020 17:14   DG Chest Portable 1 View IMPRESSION: Chronic cardiomegaly with mild or developing pulmonary interstitial edema suspected, less pronounced than that in 08/10/2023. Electronically Signed   By: Genevie Ann M.D.   On: 11/11/2020 07:49   US Abdomen Limited RUQ (LIVER/GB)  IMPRESSION: 1. Cholelithiasis with gallbladder wall thickened. Note that ascites can cause gallbladder wall thickening. Gallbladder wall does appear subtly edematous, however. This finding raises concern for potential degree of acute cholecystitis. In this  regard, it may be reasonable to consider nuclear medicine hepatobiliary imaging study to assess for cystic duct patency. Equivocal pericholecystic fluid versus mild ascites in the gallbladder fossa region. 2.  Right pleural effusion. 3.  Mild ascites.   Assessment/Plan:   Active Problems:   Acute cholecystitis  Patient Summary:   [name] [name] is a 70 year old female with PMHx of  CKD stage IV, CAD, HFrEF of 20-25% s/p pacemaker, paroxysmal atrial fibrillation, DM, hyperlipdemia, hypertension, left BKA,  presenting for evaluation of itching admitted for pruritis secondary to hyperthyroidism.  Generalized Pruritus  Hyperthyroidism  Patient reports continued itching today. 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 TSH overnight <0.01, T4 elevated. TSH wnl previously in 2016  Pruritus can be rare manifestation of TSH. Neck exam without thyromegaly or obvious mass.  Will workup cause of hyperthyroidism.  - continue hydroxyzine  - Eucerin cream  - follow up T3 - thyroid antibodies ordered - thyroid US - monitor CMP, CBC   CKD stage IV Cr. 2.53, baseline ~2.4-2.6.  -Monitor renal function -continue sodium bicarb 650 mg 3 times daily  CAD HFrEF  Patient with ED of 20-25% with a pacemaker. Seen by Dr. Lovena Le deferred biventricular pacemaker despite worsening heart failure as she is not having significant symptoms. Does not appear volume overloaded on exam.  -Continue on home coreg, bidil, irbesartan -monitor I/Os -Monitor electrolytes   Paroxsymal atrial fibrillation  Rate controlled on coreg 12.5. Currently on Xarelto 15 mg daily.  -Continue home medications  Hypertension Hyperlipidemia -Continue coreg, bidil, valsartan  Diabetes Mellitus On Jardiance at home.  -monitor CBGs  Cholelithiasis  Patient is asymptomatic without abdominal pain. Afebrile with stable vitals. HIDA scan without  obstruction. T abdomen and abdomen US with cholelithiasis with mild gallbladder wall thickening but no ductal dilation.Do not think she has acute cholecystitis at this time.   Diet: heart healthy, carb modified  Fluids: none DVT: Xarelto Code: Full  Dispo: Anticipated discharge to Home in 2 days pending further management. [name], MD 11/13/2020, 6:14 AM Pager: [phone]  Please contact the on call pager after 5 pm and on weekends at [phone].

## 2020-11-14 DIAGNOSIS — N184 Chronic kidney disease, stage 4 (severe): Secondary | ICD-10-CM | POA: Diagnosis not present

## 2020-11-14 DIAGNOSIS — E039 Hypothyroidism, unspecified: Secondary | ICD-10-CM | POA: Diagnosis not present

## 2020-11-14 DIAGNOSIS — K810 Acute cholecystitis: Secondary | ICD-10-CM | POA: Diagnosis not present

## 2020-11-14 DIAGNOSIS — E0590 Thyrotoxicosis, unspecified without thyrotoxic crisis or storm: Secondary | ICD-10-CM

## 2020-11-14 DIAGNOSIS — K819 Cholecystitis, unspecified: Secondary | ICD-10-CM

## 2020-11-14 DIAGNOSIS — E063 Autoimmune thyroiditis: Secondary | ICD-10-CM | POA: Diagnosis not present

## 2020-11-14 DIAGNOSIS — L299 Pruritus, unspecified: Secondary | ICD-10-CM | POA: Diagnosis not present

## 2020-11-14 LAB — COMPREHENSIVE METABOLIC PANEL
ALT: 14 U/L (ref 0–44)
AST: 23 U/L (ref 15–41)
Albumin: 2.1 g/dL — ABNORMAL LOW (ref 3.5–5.0)
Alkaline Phosphatase: 86 U/L (ref 38–126)
Anion gap: 8 (ref 5–15)
BUN: 42 mg/dL — ABNORMAL HIGH (ref 8–23)
CO2: 19 mmol/L — ABNORMAL LOW (ref 22–32)
Calcium: 8.1 mg/dL — ABNORMAL LOW (ref 8.9–10.3)
Chloride: 111 mmol/L (ref 98–111)
Creatinine, Ser: 2.71 mg/dL — ABNORMAL HIGH (ref 0.44–1.00)
GFR, Estimated: 19 mL/min — ABNORMAL LOW (ref >60)
Glucose, Bld: 80 mg/dL (ref 70–99)
Potassium: 4.8 mmol/L (ref 3.5–5.1)
Sodium: 138 mmol/L (ref 135–145)
Total Bilirubin: 2.1 mg/dL — ABNORMAL HIGH (ref 0.3–1.2)
Total Protein: 5.1 g/dL — ABNORMAL LOW (ref 6.5–8.1)

## 2020-11-14 LAB — CBC
HCT: 28.9 % — ABNORMAL LOW (ref 36.0–46.0)
Hemoglobin: 9.5 g/dL — ABNORMAL LOW (ref 12.0–15.0)
MCH: 29.1 pg (ref 26.0–34.0)
MCHC: 32.9 g/dL (ref 30.0–36.0)
MCV: 88.4 fL (ref 80.0–100.0)
Platelets: 135 10*3/uL — ABNORMAL LOW (ref 150–400)
RBC: 3.27 MIL/uL — ABNORMAL LOW (ref 3.87–5.11)
RDW: 16.9 % — ABNORMAL HIGH (ref 11.5–15.5)
WBC: 5.7 10*3/uL (ref 4.0–10.5)
nRBC: 0 % (ref 0.0–0.2)

## 2020-11-14 LAB — GLUCOSE, CAPILLARY
Glucose-Capillary: 79 mg/dL (ref 70–99)
Glucose-Capillary: 142 mg/dL — ABNORMAL HIGH (ref 70–99)
Glucose-Capillary: 105 mg/dL — ABNORMAL HIGH (ref 70–99)

## 2020-11-14 MED ORDER — HYDROCERIN EX CREA: 1.0000 "application " | TOPICAL_CREAM | Freq: Two times a day (BID) | CUTANEOUS | 0 refills | Status: AC

## 2020-11-14 MED ORDER — SODIUM BICARBONATE 650 MG PO TABS
1300.0000 mg | ORAL_TABLET | Freq: Three times a day (TID) | ORAL | Status: DC
Start: 2020-11-14 — End: 2020-11-15
  Administered 2020-11-14 (×2): 1300 mg via ORAL
  Filled 2020-11-14 (×2): qty 2

## 2020-11-14 MED ORDER — SODIUM BICARBONATE 650 MG PO TABS: 1300.0000 mg | ORAL_TABLET | Freq: Three times a day (TID) | ORAL | 0 refills | Status: DC

## 2020-11-14 MED ORDER — ALBUTEROL SULFATE HFA 108 (90 BASE) MCG/ACT IN AERS
2.0000 | INHALATION_SPRAY | Freq: Four times a day (QID) | RESPIRATORY_TRACT | Status: DC | PRN
  Filled 2020-11-14: qty 6.7

## 2020-11-14 MED ORDER — HYDROXYZINE HCL 10 MG PO TABS
10.0000 mg | ORAL_TABLET | Freq: Two times a day (BID) | ORAL | Status: DC | PRN
  Administered 2020-11-14: 10 mg via ORAL
  Filled 2020-11-14: qty 1

## 2020-11-14 NOTE — Discharge Summary (Signed)
Name: [name] [name] MRN: [id number] DOB: [birth year] 70 y.o. PCP: [name], MD  Date of Admission: 11/11/2020  3:07 AM Date of Discharge:  11/14/20 Attending Physician: Oda Kilts, MD  Discharge Diagnosis: 1. Generalized pruritis  2. Hyperthyroidism 3. Cholelithiasis  4. CKD stage 4  Discharge Medications: Allergies as of 11/14/2020      Reactions   Penicillins Itching   Did it involve swelling of the face/tongue/throat, SOB, or low BP? No Did it involve sudden or severe rash/hives, skin peeling, or any reaction on the inside of your mouth or nose? No Did you need to seek medical attention at a hospital or doctor's office? No When did it last happen?10 years If all above answers are "NO", may proceed with cephalosporin use.      Medication List    STOP taking these medications   capsaicin-methyl sal-menthol 0.025-1-12 % Crea Generic drug: Capsaicin-Menthol-Methyl Sal     TAKE these medications   albuterol 108 (90 Base) MCG/ACT inhaler Commonly known as: ProAir HFA Inhale 2 puffs into the lungs every 6 (six) hours as needed for wheezing or shortness of breath (cough).   atorvastatin 40 MG tablet Commonly known as: LIPITOR Take 1 tablet (40 mg total) by mouth daily.   BiDil 20-37.5 MG tablet Generic drug: isosorbide-hydrALAZINE Take 1 tablet by mouth 3 (three) times daily. What changed: See the new instructions.   calcitRIOL 0.5 MCG capsule Commonly known as: ROCALTROL Take 0.5 mcg by mouth daily.   carvedilol 12.5 MG tablet Commonly known as: COREG Take 1 tablet (12.5 mg total) by mouth 2 (two) times daily with a meal.   diclofenac Sodium 1 % Gel Commonly known as: VOLTAREN Apply 2 g topically 4 (four) times daily.   empagliflozin 10 MG Tabs tablet Commonly known as: JARDIANCE Take 1 tablet (10 mg total) by mouth daily before breakfast.   furosemide 80 MG tablet Commonly known as: Lasix Take 1 tablet (80 mg total) by mouth daily  as needed.   gabapentin 300 MG capsule Commonly known as: NEURONTIN Take 1 capsule (300 mg total) by mouth 2 (two) times daily.   hydrocerin Crea Apply 1 application topically 2 (two) times daily.   hydrOXYzine 10 MG tablet Commonly known as: ATARAX/VISTARIL Take 1 tablet (10 mg total) by mouth 2 (two) times daily as needed for itching.   loperamide 2 MG capsule Commonly known as: IMODIUM Take 1 tablet (2 mg total) by mouth 4 (four) times daily as needed for diarrhea or loose stools. What changed: See the new instructions.   pantoprazole 40 MG tablet Commonly known as: PROTONIX Take 1 tablet (40 mg total) by mouth daily.   potassium chloride 10 MEQ tablet Commonly known as: KLOR-CON Take 10 mEq by mouth daily.   sodium bicarbonate 650 MG tablet Take 2 tablets (1,300 mg total) by mouth 3 (three) times daily. What changed: how much to take   Travoprost (BAK Free) 0.004 % Soln ophthalmic solution Commonly known as: TRAVATAN Place 1 drop into both eyes at bedtime.   valsartan 80 MG tablet Commonly known as: DIOVAN Take 2 tablets (160 mg total) by mouth daily. What changed: See the new instructions.   Xarelto 15 MG Tabs tablet Generic drug: Rivaroxaban Take 1 tablet (15 mg total) by mouth daily with supper. What changed: See the new instructions.       Disposition and follow-up:   Ms.Terra Diane Stukey was discharged from [facility] in Stable condition.  At  the hospital follow up visit please address:  1.  Pruritus: Has referral to dermatology, Continued on hydroxyzine and eucerin cream.   Hyperthyroidism: Will need repeat TSH T4 T3 in 4-6 weeks. Radioiodine study ordered ensure she is able to complete this. Treatment pending etiology of hyperthyroidism found.  CKD Stage 4: persistently low bicarb around 17 during admission, increase sodium bicarb to '1300mg'$  3 times daily. Monitor of adherance and improvement.   2.  Labs / imaging needed at time of  follow-up: BMP  3.  Pending labs/ test needing follow-up: Radioiodine uptake   Follow-up Appointments:  Follow-up Information    [facility]. Schedule an appointment as soon as possible for a visit.   Contact information: [address] [place] [facility] Course by problem list: [name] is a 70 year old female with PMHx ofCKD stage IV, CAD, HFrEF of 20-25% s/p pacemaker, paroxysmal atrial fibrillation, DM, hyperlipdemia, hypertension, left BKA, presenting for evaluation of itching admitted for pruritis found to have hyperthyroidism.  Hyperthyroidism  Found to have incidental TSH <0.01 and elevated t4 with normal t3. Denies biotin use or other supplements. No overt signs of exophthalmos, thyroid bruit, or dermatological changes. TRab negative TPO positive. US thyroid with small nodules no requiring biopsy or dedicated follow up. Etiology of hyperthyroidism likely autoimmune thyroiditis and is in transient hyper thyroid state. Unable to obtain iodine uptake study during admission. Have ordered this as an outpatient. Needs close follow up at [facility] to further workup and manage this.   Generalized Pruritus  Patient presents for ongoing itching over her entire body worst around her buttocks. She has excoriations throughout but no rashes or primary lesions. Has been on hydroxyzine for several months for this recently started on capsacin cream which she feels irritated her skin. Elevated Bili in ED. Normal LDH.  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 Denies changes in home detergents or cleaning products. TSH found to be incidentally decreased and hyperthyroidism can rearly present with itching however likely this is transient hyperthyroidism secondary to autoimmune thyroiditis. Unlikely to have caused her persistent symptoms. No clear etiology. Will have her follow up  as outpatient with dermatology for this, if primary lesion found may also benefit from biopsy.   CKD stage IV Cr remained at baseline. Bicarb persistently around 19 on home dose of sodium bicarb 650 mg 3 times daily. Increased to 1300 mg 3 times daily. Will need follow up to further adjustment as an outpatient.   Cholelithiasis US abdomen with cholelithiasis concerning for acute cholelithiasis. HIDA scan without obstruction. T abdomen and abdomen US with cholelithiasis with mild gallbladder wall thickening but no ductal dilation. Do not think she has acute cholecystitis at this time. Patient remain asymptomatic without abdominal pain during admission.  Discharge Exam:   BP (!) 122/45 (BP Location: Right Arm)   Pulse 70   Temp 98.3 F (36.8 C) (Oral)   Resp 14   Ht '5\' 2"'$  (1.575 m)   Wt 63 kg   SpO2 98%   BMI 25.40 kg/m  Discharge exam:  Physical Exam Constitutional:  Normal appearance.  Eyes: No exophthalmos  Neck: No thyroid mass, thyromegaly or thyroid tenderness.  No bruit Cardiovascular: Normal rate and regular rhythm.  Normal pulses.  Normal heart sounds.  Pulmonary: No respiratory distress. Normal breath sounds.  Abdominal: Abdomen is flat. Abdomen  is soft.  Skin: Excoriation of the sacral area and upper back. No obvious rashes   Neurological:: She is alert and oriented to person, place, and time.   Pertinent Labs, Studies, and Procedures:  CT Abdomen Pelvis Wo Contrast  Result Date: 11/11/2020 CLINICAL DATA:  Back, buttock, and thigh itchiness for the past month. EXAM: CT ABDOMEN AND PELVIS WITHOUT CONTRAST TECHNIQUE: Multidetector CT imaging of the abdomen and pelvis was performed following the standard protocol without IV contrast. COMPARISON:  CT abdomen pelvis dated September 26, 2011. FINDINGS: Lower chest: Cardiomegaly. Small right greater than left pleural effusions with adjacent lower lobe atelectasis. Hepatobiliary: No focal liver abnormality. The gallbladder is  decompressed and contains several small gallstones. No biliary dilatation. Pancreas: Unremarkable. No pancreatic ductal dilatation or surrounding inflammatory changes. Spleen: Normal in size without focal abnormality. Adrenals/Urinary Tract: The adrenal glands and right kidney are unremarkable. 2.0 cm left renal simple cysts. No calculi or hydronephrosis. The bladder is largely decompressed. Stomach/Bowel: Small hiatal hernia. The stomach is otherwise within normal limits. No bowel wall thickening, distention, or surrounding inflammatory changes. Normal appendix. Vascular/Lymphatic: Aortic atherosclerosis. No enlarged abdominal or pelvic lymph nodes. Reproductive: Status post hysterectomy. No adnexal masses. Other: Trace free fluid in the pelvis.  No pneumoperitoneum. Musculoskeletal: No acute or significant osseous findings. Prior left posterior acetabulum ORIF. Notable diffuse lucency of the vertebral body endplates, most suggestive of osteoporosis. Diffuse anasarca. IMPRESSION: 1. No acute intra-abdominal process. 2. Cholelithiasis. 3. Small right greater than left pleural effusions. Trace ascites. Diffuse anasarca. 4. Aortic Atherosclerosis (ICD10-I70.0). Electronically Signed   By: Titus Dubin M.D.   On: 11/11/2020 07:49   NM Hepatobiliary Liver Func  Result Date: 11/11/2020 CLINICAL DATA:  Elevated total bilirubin. EXAM: NUCLEAR MEDICINE HEPATOBILIARY IMAGING TECHNIQUE: Sequential images of the abdomen were obtained out to 60 minutes following intravenous administration of radiopharmaceutical. RADIOPHARMACEUTICALS:  7.47 mCi Tc-81m Choletec IV COMPARISON:  Current ultrasound, which demonstrates gallstones. FINDINGS: Prompt uptake and biliary excretion of activity by the liver is seen. Gallbladder activity is visualized, consistent with patency of cystic duct. Biliary activity passes into small bowel, consistent with patent common bile duct. IMPRESSION: 1. Normal exam.  Specifically, patent cystic and  common bile ducts. Electronically Signed   By: DLajean ManesM.D.   On: 11/11/2020 17:14   DG Chest Portable 1 View  Result Date: 11/11/2020 CLINICAL DATA:  70year old female with cough.  Skin sores. EXAM: PORTABLE CHEST 1 VIEW COMPARISON:  Portable chest 130-Oct-2021and earlier. FINDINGS: Portable AP semi upright view at 0729 hours. Stable cardiomegaly and mediastinal contours. Stable left chest cardiac pacemaker. Lung volumes are stable and within normal limits. Visualized tracheal air column is within normal limits. Mild diffuse increased interstitial opacity, less than demonstrated in O2024/10/30 but increased compared to 2019. No pneumothorax, consolidation, or definite pleural effusion. Chronic left shoulder arthroplasty. Stable visualized osseous structures. Negative visible bowel gas pattern. IMPRESSION: Chronic cardiomegaly with mild or developing pulmonary interstitial edema suspected, less pronounced than that in O10/30/24 Electronically Signed   By: HGenevie AnnM.D.   On: 11/11/2020 07:49   [place]ROID  Result Date: 11/12/2020 CLINICAL DATA:  Hyperthyroidism. EXAM: THYROID ULTRASOUND TECHNIQUE: Ultrasound examination of the thyroid gland and adjacent soft tissues was performed. COMPARISON:  None. FINDINGS: Parenchymal Echotexture: Mildly heterogenous Isthmus: 0.6 cm Right lobe: 4.3 x 2.2 x 1.7 cm Left lobe: 4.7 x 2.4 x 1.8 cm _________________________________________________________ Estimated total number of nodules >/= 1 cm: 1 Number of spongiform nodules >/=  2 cm not described below (TR1): 0 Number of mixed cystic and solid nodules >/= 1.5 cm not described below ([place]): 0 _________________________________________________________ Multiple small thyroid nodules. Largest nodule is located in the right superior thyroid lobe. The largest nodule is a mixed cystic and solid nodule that measures 1.0 x 0.7 x 0.9 cm. This nodule does not meet criteria for biopsy or dedicated follow-up. IMPRESSION: Thyroid tissue  is mildly heterogeneous with small nodules. Nodules do not meet criteria for biopsy or dedicated follow-up. The above is in keeping with the ACR TI-RADS recommendations - J Am Coll Radiol 2017;14:587-595. Electronically Signed   By: Markus Daft M.D.   On: 11/12/2020 12:09   US Abdomen Limited RUQ (LIVER/GB)  Result Date: 11/11/2020 CLINICAL DATA:  Elevated liver enzymes EXAM: ULTRASOUND ABDOMEN LIMITED RIGHT UPPER QUADRANT COMPARISON:  CT abdomen and pelvis November 11, 2020 FINDINGS: Gallbladder: Within the gallbladder, there are echogenic foci which move and shadow consistent with cholelithiasis. Largest individual gallstone measures 6 mm in length. The gallbladder wall is thickened and subtly edematous. There is equivocal pericholecystic fluid versus mild ascites. No sonographic Murphy sign noted by sonographer. Common bile duct: Diameter: 3 mm. No intrahepatic or extrahepatic biliary duct dilatation. Liver: No focal lesion identified. Within normal limits in parenchymal echogenicity. Portal vein is patent on color Doppler imaging with normal direction of blood flow towards the liver. Other: Small amount ascites.  Right pleural effusion noted. IMPRESSION: 1. Cholelithiasis with gallbladder wall thickened. Note that ascites can cause gallbladder wall thickening. Gallbladder wall does appear subtly edematous, however. This finding raises concern for potential degree of acute cholecystitis. In this regard, it may be reasonable to consider nuclear medicine hepatobiliary imaging study to assess for cystic duct patency. Equivocal pericholecystic fluid versus mild ascites in the gallbladder fossa region. 2.  Right pleural effusion. 3.  Mild ascites. Electronically Signed   By: Lowella Grip III M.D.   On: 11/11/2020 08:43    Discharge Instructions: Discharge Instructions    Ambulatory referral to Dermatology   Complete by: As directed    Diet - low sodium heart healthy   Complete by: As directed     Discharge instructions   Complete by: As directed    You were hospitalized for generalized itching. We have not found a clear cause for your itching I have place a referral for dermatology to help with this. In the meantime you can continue to use hydroxyzine for this as needed as well as applying cream to keep you skin hydrated. We have also found abnormalities in you thyroid levels and you will need further testing to find out the cause of this. Please follow up at [facility] to have this arranged. Thank you for allowing [place] to be part of your care.   Increase activity slowly   Complete by: As directed       Signed: [name], MD 11/14/2020, 2:42 PM   Pager: [phone]

## 2020-11-14 NOTE — Progress Notes (Incomplete)
HD#3 Subjective:  Overnight Events: None    Objective:  Vital signs in last 24 hours: Vitals:   11/13/20 1944 11/13/20 2000 11/14/20 0500 11/14/20 0505  BP: (!) 114/52   102/69  Pulse: 82   69  Resp: 20   18  Temp: 97.8 F (36.6 C)   98.1 F (36.7 C)  TempSrc: Oral   Axillary  SpO2: 100% 100%  92%  Weight:   63 kg   Height:       Supplemental O2: Room Air SpO2: 92 %   Physical Exam:  Physical Exam Constitutional:      Appearance: Normal appearance.  Neck:     Thyroid: No thyroid mass, thyromegaly or thyroid tenderness.     Comments: No bruit Cardiovascular:     Rate and Rhythm: Normal rate and regular rhythm.     Pulses: Normal pulses.     Heart sounds: Normal heart sounds.  Pulmonary:     Effort: Pulmonary effort is normal.     Breath sounds: Normal breath sounds.  Abdominal:     General: Abdomen is flat. Bowel sounds are normal.     Palpations: Abdomen is soft.  Skin:    General: Skin is warm and dry.     Comments: Excoriation of the sacral area and upper back. No obvious rashes   Neurological:     General: No focal deficit present.     Mental Status: She is alert and oriented to person, place, and time.     Filed Weights   11/12/20 0500 11/13/20 0500 11/14/20 0500  Weight: 60.7 kg 63.4 kg 63 kg    No intake or output data in the 24 hours ending 11/14/20 0701 Net IO Since Admission: -1 mL [11/14/20 0701]  Pertinent Labs: CBC Latest Ref Rng & Units 11/14/2020 11/13/2020 11/12/2020  WBC 4.0 - 10.5 K/uL 5.7 5.6 5.9  Hemoglobin 12.0 - 15.0 g/dL 9.5(L) 9.1(L) 10.0(L)  Hematocrit 36.0 - 46.0 % 28.9(L) 26.7(L) 29.2(L)  Platelets 150 - 400 K/uL 135(L) 121(L) 149(L)    CMP Latest Ref Rng & Units 11/14/2020 11/13/2020 11/12/2020  Glucose 70 - 99 mg/dL 80 137(H) 141(H)  BUN 8 - 23 mg/dL 42(H) 41(H) 35(H)  Creatinine 0.44 - 1.00 mg/dL 2.71(H) 2.65(H) 2.48(H)  Sodium 135 - 145 mmol/L 138 140 139  Potassium 3.5 - 5.1 mmol/L 4.8 3.4(L) 3.6  Chloride 98 -  111 mmol/L 111 112(H) 112(H)  CO2 22 - 32 mmol/L 19(L) 19(L) 18(L)  Calcium 8.9 - 10.3 mg/dL 8.1(L) 7.9(L) 8.4(L)  Total Protein 6.5 - 8.1 g/dL 5.1(L) 4.7(L) 5.5(L)  Total Bilirubin 0.3 - 1.2 mg/dL 2.1(H) 1.8(H) 2.5(H)  Alkaline Phos 38 - 126 U/L 86 85 101  AST 15 - 41 U/L '23 19 22  '$ ALT 0 - 44 U/L '14 11 14    '$ Imaging: CT Abdomen Pelvis Wo Contrast  IMPRESSION: 1. No acute intra-abdominal process. 2. Cholelithiasis. 3. Small right greater than left pleural effusions. Trace ascites. Diffuse anasarca. 4. Aortic Atherosclerosis (ICD10-I70.0). Electronically Signed   By: Titus Dubin M.D.   On: 11/11/2020 07:49   NM Hepatobiliary Liver Func  Result Date: 11/11/2020 CLINICAL DATA:  Elevated total bilirubin. EXAM: NUCLEAR MEDICINE HEPATOBILIARY IMAGING TECHNIQUE: Sequential images of the abdomen were obtained out to 60 minutes following intravenous administration of radiopharmaceutical. RADIOPHARMACEUTICALS:  7.47 mCi Tc-55m Choletec IV COMPARISON:  Current ultrasound, which demonstrates gallstones. FINDINGS: Prompt uptake and biliary excretion of activity by the liver is seen. Gallbladder activity  is visualized, consistent with patency of cystic duct. Biliary activity passes into small bowel, consistent with patent common bile duct. IMPRESSION: 1. Normal exam.  Specifically, patent cystic and common bile ducts. Electronically Signed   By: Lajean Manes M.D.   On: 11/11/2020 17:14   DG Chest Portable 1 View IMPRESSION: Chronic cardiomegaly with mild or developing pulmonary interstitial edema suspected, less pronounced than that in 08-10-23. Electronically Signed   By: Genevie Ann M.D.   On: 11/11/2020 07:49   US Abdomen Limited RUQ (LIVER/GB)  IMPRESSION: 1. Cholelithiasis with gallbladder wall thickened. Note that ascites can cause gallbladder wall thickening. Gallbladder wall does appear subtly edematous, however. This finding raises concern for potential degree of acute cholecystitis. In this  regard, it may be reasonable to consider nuclear medicine hepatobiliary imaging study to assess for cystic duct patency. Equivocal pericholecystic fluid versus mild ascites in the gallbladder fossa region. 2.  Right pleural effusion. 3.  Mild ascites.   US thyroid  IMPRESSION: Thyroid tissue is mildly heterogeneous with small nodules. Nodules do not meet criteria for biopsy or dedicated follow-up.  The above is in keeping with the ACR TI-RADS recommendations - J Am Coll Radiol 2017;14:587-595.  Assessment/Plan:   Active Problems:   Acute cholecystitis  Patient Summary:   [name] is a 70 year old female with PMHx of  CKD stage IV, CAD, HFrEF of 20-25% s/p pacemaker, paroxysmal atrial fibrillation, DM, hyperlipdemia, hypertension, left BKA,  presenting for evaluation of itching admitted for pruritis found to have hyperthyroidism.  Hyperthyroidism  Patient with 2 episodes of diarrhea overnight. Denies any OTC supplements including biotin.  No overt signs of exophthalmos, thyroid bruit, or dermatological changes. TSH <0..01 with elevated T4, normal T3. TRab pending. US thyroid with small nodules no requiring biopsy or dedicated follow up. Etiology of hyperthyroidism likely Graves vs autoimmunine thyroiditis.  -Radioiodine uptake study ordered.  -Follow up [place] to start on methimazole if workup consistent with Grave's disease  Generalized Pruritus  Patient reports continued itching today. Bili improved to 1.0. No rashes but diffuse excoriations. No sign of biliary obstruction. May be related to thyrotoxicosis although this is rare presentation, this may improve with treatment if studies show Grave's disease. May benefit from outpatient dermatology follow up. - Benadryl 50 mg nightly - Eucerin cream   CKD stage IV Cr. 2.65, baseline ~2.4-2.6.  -Monitor renal function -continue sodium bicarb 650 mg 3 times daily  CAD HFrEF  Patient with ED of 20-25% with a pacemaker.  Seen by Dr. Lovena Le deferred biventricular pacemaker despite worsening heart failure as she is not having significant symptoms. Remains euvolemic on exam. -Continue on home coreg, bidil, irbesartan -monitor I/Os -Monitor electrolytes   Paroxsymal atrial fibrillation  Rate controlled on coreg 12.5. Currently on Xarelto 15 mg daily.  -Continue home medications  Hypertension Hyperlipidemia -Continue coreg, bidil, valsartan  Diabetes Mellitus On Jardiance at home.  -monitor CBGs  Cholelithiasis  Patient is asymptomatic without abdominal pain. Afebrile with stable vitals. HIDA scan without obstruction. T abdomen and abdomen US with cholelithiasis with mild gallbladder wall thickening but no ductal dilation.Do not think she has acute cholecystitis at this time.   Diet: heart healthy, carb modified  Fluids: none DVT: Xarelto Code: Full  Dispo: Anticipated discharge to Home in tomorrow.   [name], MD 11/14/2020, 7:01 AM Pager: [phone]  Please contact the on call pager after 5 pm and on weekends at [phone].

## 2020-11-15 ENCOUNTER — Telehealth: Payer: Self-pay

## 2020-11-15 ENCOUNTER — Telehealth: Payer: Self-pay | Admitting: Internal Medicine

## 2020-11-15 NOTE — Telephone Encounter (Signed)
HFU TOC APPT [facility] FOR 11/21/2020 @ 9:45 AM WITH DR Wynetta Emery

## 2020-11-15 NOTE — Telephone Encounter (Signed)
[facility] alert received for ongoing persistent AF, this is a known issue.  Noted that device currently in high output mode for both RA and RV lead.  Contacted pt to schedule for in-clinic testing/ programming.  Pt indicates that she is unable to get transportation for multiple visits.  She is scheduled to see Dr. Haroldine Laws next week 11/19/20 at 9:20am.  [employer] contacted, they can be present at that appointment to make necessary changes.

## 2020-11-19 ENCOUNTER — Encounter (HOSPITAL_COMMUNITY): Payer: Medicaid Other | Admitting: Internal Medicine

## 2020-11-19 NOTE — Telephone Encounter (Signed)
Transition Care Management Unsuccessful Follow-up Telephone Call  Date of discharge and from where:  11/14/20 from the hospital.  Attempts:  1st Attempt  Reason for unsuccessful TCM follow-up call:  No answer and no self-identified vm.

## 2020-11-19 NOTE — Telephone Encounter (Signed)
Notification received that patient cancelled appt with HF clinic.  Spoke to patient.  Advised will need her to be seen in our office then.  Explained importance of device check and that it could impact how her pacemaker works/ battery depletion.  Scheduled patient for next available device clinic appt- 11/22/2020 at 9:20am.  Provided patient with the address for the appointment.  She stated she will call her transportation right now.

## 2020-11-20 ENCOUNTER — Ambulatory Visit: Payer: Medicare Other | Admitting: *Deleted

## 2020-11-20 DIAGNOSIS — I5020 Unspecified systolic (congestive) heart failure: Secondary | ICD-10-CM

## 2020-11-20 DIAGNOSIS — I5023 Acute on chronic systolic (congestive) heart failure: Secondary | ICD-10-CM

## 2020-11-20 DIAGNOSIS — E1122 Type 2 diabetes mellitus with diabetic chronic kidney disease: Secondary | ICD-10-CM

## 2020-11-20 DIAGNOSIS — Z794 Long term (current) use of insulin: Secondary | ICD-10-CM

## 2020-11-20 DIAGNOSIS — I10 Essential (primary) hypertension: Secondary | ICD-10-CM

## 2020-11-20 NOTE — Patient Instructions (Signed)
Visit Information It was nice speaking with you today. Patient Care Plan: CCM RN - Improve Medication Taking Behavior    Problem Identified: Medication Adherence (Wellness)     Goal: Medication Adherence Improved   Start Date: 09/19/2020  Expected End Date: 03/28/2021  This Visit's Progress: Not on track  Recent Progress: Not on track  Priority: High  Note:   Current Barriers:  . Non-adherence to prescribed medication regimen . Does not adhere to prescribed medication regimen- spoke with patient via phone, she was hospitalized from 2/13-2/16 for acute kidney injury, pruritis and ascites, states her arms and thighs are still very swollen and this is making it hard for her to perform ADLs when her PCS aide is not in the home,  she is aware she has a clinic appointment tomorrow and has arranged transportation, patient states she will bring her list of medications with her to her clinic appointment tomorrow, she did not have time to review medications with this CCM RN as she was receiving another call to finalized her pick up time with transportation to her clinic appointment in the morning  Nurse Case Manager Clinical Goal(s):  Marland Kitchen Over the next 30-60 days, patient will work with clinic pharmacist to improve medication taking behavior  Interventions:  . 1:1 collaboration with [name], MD regarding development and update of comprehensive plan of care as evidenced by provider attestation and co-signature . Inter-disciplinary care team collaboration (see longitudinal plan of care) . Reminded patient to bring medications and/or list of current medications she is taking with her to clinic appointment on 11/21/20 . Will ask provider again for pharmacy referral for inconsistent medication taking behavior  Patient Goals/Self-Care Activities Over the next 30-60 days, patient will:  - Patient will self administer medications as prescribed Patient will attend all scheduled provider  appointments Patient will call pharmacy for medication refills Meet with pharmacist to address medication taking barriers  Follow Up Plan: The care management team will reach out to the patient again over the next 30-60 days.        Task: Optimize Medication Use   Due Date: 03/28/2021  Priority: ASAP  Note:   Care Management Activities:    - barriers to medication adherence identified - messaged provider for referral to clinic pharmacist for counseling - medication-adherence assessment completed    Notes:    Patient Care Plan: CCM RN Fall risk due to amputation and lives alone and reported hx of falls    Problem Identified: Fall Risk     Goal: Patient will call health plan customer service number and determine if personal safety alarm system is a benefit of the plan   Start Date: 09/19/2020  Expected End Date: 11/26/2020  This Visit's Progress: Not on track  Recent Progress: On track  Priority: High  Note:   Current Barriers:  . At risk for falls, lives alone, has LE amputation- patient states she forgot to call her health insurance plan ,patient denies recent falls . Unable to independently determine health plan benefit for personal safety alarm system  Nurse Case Manager Clinical Goal(s):  Marland Kitchen Over the next 30-60 days, patient will verbalize understanding of plan to contact customer service of health plan to determine benefit for personal safety alarm system  Interventions:  . 1:1 collaboration with [name], MD regarding development and update of comprehensive plan of care as evidenced by provider attestation and co-signature . Inter-disciplinary care team collaboration (see longitudinal plan of care)- messaged provider requesting referral to clinic  pharmacist to address medication taking barriers and for refills on Bumex, Jardiance and sodium bicarb . 09/19/20 Collaborated with [facility] staff member Severance  regarding health plan benefits . 10/22/20 Reminded patient  to call customer service for benefit determination related to personal safety alarm system  Patient Goals/Self-Care Activities Over the next 30-60 days, patient will:  -call customer service on [facility] card and ask about benefit for personal safety alarm system  Follow Up Plan: The care management team will reach out to the patient again over the next 30-60 days.          The patient verbalized understanding of instructions, educational materials, and care plan provided today and declined offer to receive copy of patient instructions, educational materials, and care plan.   The care management team will reach out to the patient again over the next 30-60 days.   [name] RN, CCM, [facility] RN Care Manager [phone]

## 2020-11-20 NOTE — Chronic Care Management (AMB) (Signed)
Care Management    RN Visit Note  11/20/2020 Name: [name] [name] MRN: [id number] DOB: May 27, 1951  Subjective: [name] [name] is a 70 y.o. year old female who is a primary care patient of [name], MD. The care management team was consulted for assistance with disease management and care coordination needs.    Engaged with patient by telephone for follow up visit in response to provider referral for case management and/or care coordination services.   Consent to Services:   Ms. Lush was given information about Care Management services today including:  1. Care Management services includes personalized support from designated clinical staff supervised by her physician, including individualized plan of care and coordination with other care providers 2. 24/7 contact phone numbers for assistance for urgent and routine care needs. 3. The patient may stop case management services at any time by phone call to the office staff.  Patient agreed to services and consent obtained.   Assessment: Review of patient past medical history, allergies, medications, health status, including review of consultants reports, laboratory and other test data, was performed as part of comprehensive evaluation and provision of chronic care management services.   SDOH (Social Determinants of Health) assessments and interventions performed:    Care Plan  Allergies  Allergen Reactions  . Penicillins Itching    Did it involve swelling of the face/tongue/throat, SOB, or low BP? No Did it involve sudden or severe rash/hives, skin peeling, or any reaction on the inside of your mouth or nose? No Did you need to seek medical attention at a hospital or doctor's office? No When did it last happen?10 years If all above answers are "NO", may proceed with cephalosporin use.     Outpatient Encounter Medications as of 11/20/2020  Medication Sig Note  . albuterol (PROAIR HFA) 108 (90 Base) MCG/ACT  inhaler Inhale 2 puffs into the lungs every 6 (six) hours as needed for wheezing or shortness of breath (cough).   Marland Kitchen atorvastatin (LIPITOR) 40 MG tablet Take 1 tablet (40 mg total) by mouth daily.   Marland Kitchen BIDIL 20-37.5 MG tablet Take 1 tablet by mouth 3 (three) times daily. (Patient taking differently: Take 1 tablet by mouth in the morning, at noon, and at bedtime.)   . calcitRIOL (ROCALTROL) 0.5 MCG capsule Take 0.5 mcg by mouth daily.   . carvedilol (COREG) 12.5 MG tablet Take 1 tablet (12.5 mg total) by mouth 2 (two) times daily with a meal.   . diclofenac Sodium (VOLTAREN) 1 % GEL Apply 2 g topically 4 (four) times daily.   . empagliflozin (JARDIANCE) 10 MG TABS tablet Take 1 tablet (10 mg total) by mouth daily before breakfast.   . furosemide (LASIX) 80 MG tablet Take 1 tablet (80 mg total) by mouth daily as needed. 09/19/2020: Says she knows she is to take it prn and has not needed it  . gabapentin (NEURONTIN) 300 MG capsule Take 1 capsule (300 mg total) by mouth 2 (two) times daily.   . hydrocerin (EUCERIN) CREA Apply 1 application topically 2 (two) times daily.   . hydrOXYzine (ATARAX/VISTARIL) 10 MG tablet Take 1 tablet (10 mg total) by mouth 2 (two) times daily as needed for itching.   . loperamide (IMODIUM) 2 MG capsule Take 1 tablet (2 mg total) by mouth 4 (four) times daily as needed for diarrhea or loose stools. (Patient taking differently: Take 2 mg by mouth 4 (four) times daily as needed for diarrhea or loose stools.)   .  pantoprazole (PROTONIX) 40 MG tablet Take 1 tablet (40 mg total) by mouth daily. (Patient not taking: No sig reported)   . potassium chloride (KLOR-CON) 10 MEQ tablet Take 10 mEq by mouth daily.   . sodium bicarbonate 650 MG tablet Take 2 tablets (1,300 mg total) by mouth 3 (three) times daily.   . Travoprost, BAK Free, (TRAVATAN) 0.004 % SOLN ophthalmic solution Place 1 drop into both eyes at bedtime. (Patient taking differently: Place 1 drop into both eyes at bedtime.)    . valsartan (DIOVAN) 80 MG tablet Take 2 tablets (160 mg total) by mouth daily. (Patient taking differently: Take 160 mg by mouth daily.)   . XARELTO 15 MG TABS tablet Take 1 tablet (15 mg total) by mouth daily with supper. (Patient taking differently: Take 15 mg by mouth daily with supper.)    No facility-administered encounter medications on file as of 11/20/2020.    Patient Active Problem List   Diagnosis Date Noted  . Hyperthyroidism 11/14/2020  . Acute cholecystitis 11/11/2020  . Callus 11/07/2020  . Heart failure with reduced ejection fraction ([place]) 10/05/2020  . Paroxysmal atrial fibrillation ([place]) 08/30/2020  . Secondary hypercoagulable state ([place]) 08/30/2020  . Diabetes mellitus ([place]) 07/13/2020  . Pruritus 06/20/2020  . Status post bilateral below knee amputation ([place]) 04/20/2019  . Chronic cough 08/03/2018  . Status post reverse total shoulder replacement, left   . Neck pain, chronic 10/20/2017  . Cognitive impairment 08/28/2016  . Cardiomyopathy ([place])   . Vitamin D deficiency 06/01/2014  . 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[place]) 03/11/2013  . Preventative health care 02/09/2013  . Below knee amputation status, left 12/20/2012  . CKD stage 4 secondary to hypertension ([place]) 11/04/2012  . Normocytic anemia 11/04/2012  . 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 Chronic diarrhea 08/25/2012  . Complete heart block ([place])   . PACEMAKER-[facility] 03/15/2010  . HYPERPARATHYROIDISM, SECONDARY 01/16/2010  . Hyperlipidemia 11/23/2007  . DIABETIC  RETINOPATHY 08/12/2006  . DIABETIC PERIPHERAL NEUROPATHY 08/12/2006  . Essential hypertension 08/12/2006  . PERIPHERAL VASCULAR DISEASE 08/12/2006    Conditions to be addressed/monitored: NIDDM,  HTN, HLD, CAD, CKD stage 4, PAF, CHB with pacemaker, L BKA  Care Plan : CCM RN - Improve Medication Taking Behavior  Updates made by [name], RN since 11/20/2020 12:00 AM    Problem: Medication Adherence  (Wellness)     Goal: Medication Adherence Improved   Start Date: 09/19/2020  Expected End Date: 03/28/2021  This Visit's Progress: Not on track  Recent Progress: Not on track  Priority: High  Note:   Current Barriers:  . Non-adherence to prescribed medication regimen Does not adhere to prescribed medication regimen-spoke with patient via phone, she was hospitalized from 2/13-2/16 for acute kidney injury, pruritis and ascites, states her arms and thighs are still very swollen and this is making it hard for her to perform ADLs when her PCS aide is not in the home,  she is aware she has a clinic appointment tomorrow and has arranged transportation, patient states she will bring her list of medications with her to her clinic appointment tomorrow, she did not have time to review medications with this CCM RN as she was receiving another call to finalized her pick up time with transportation to her clinic appointment in the morning  Nurse Case Manager Clinical Goal(s):  Marland Kitchen Over the next 30-60 days, patient will work with clinic pharmacist to improve medication taking behavior  Interventions:  1:1 collaboration with [name], MD regarding development and update of comprehensive plan of care as evidenced by provider attestation and co-signature  Inter-disciplinary care team collaboration (see longitudinal plan of care)  Reminded patient to bring medications and/or list of current medications she is taking with her to clinic appointment on 11/21/20  Will ask provider again for pharmacy referral for inconsistent medication taking behavior  Patient Goals/Self-Care Activities Over the next 30-60 days, patient will:  - Patient will self administer medications as prescribed Patient will attend all scheduled provider appointments Patient will call pharmacy for medication refills Meet with pharmacist to address medication taking barriers  Follow Up Plan: The care management team will reach out to  the patient again over the next 30-60 days.        Task: Optimize Medication Use   Due Date: 03/28/2021  Priority: ASAP  Note:   Care Management Activities:    - barriers to medication adherence identified - messaged provider for referral to clinic pharmacist for counseling - medication-adherence assessment completed    Notes:      Plan: The care management team will reach out to the patient again over the next 30-60 days.  [name] RN, CCM, [facility] RN Care Manager [phone]

## 2020-11-21 ENCOUNTER — Ambulatory Visit (INDEPENDENT_AMBULATORY_CARE_PROVIDER_SITE_OTHER): Payer: Medicare Other | Admitting: Student

## 2020-11-21 ENCOUNTER — Ambulatory Visit (HOSPITAL_COMMUNITY): Payer: Medicare Other

## 2020-11-21 ENCOUNTER — Encounter: Payer: Self-pay | Admitting: Student

## 2020-11-21 ENCOUNTER — Ambulatory Visit (HOSPITAL_COMMUNITY)
Admission: RE | Admit: 2020-11-21 | Discharge: 2020-11-21 | Disposition: A | Discharge status: Home / Self Care | Payer: Medicare Other | Source: Ambulatory Visit | Attending: Internal Medicine | Admitting: Internal Medicine

## 2020-11-21 ENCOUNTER — Other Ambulatory Visit: Payer: Self-pay

## 2020-11-21 ENCOUNTER — Inpatient Hospital Stay (HOSPITAL_COMMUNITY)
Admission: AD | Admit: 2020-11-21 | Discharge: 2020-12-08 | DRG: 286 | Disposition: A | Discharge status: Home / Self Care | Payer: Medicare Other | Source: Ambulatory Visit | Attending: Student in an Organized Health Care Education/Training Program | Admitting: Student in an Organized Health Care Education/Training Program

## 2020-11-21 VITALS — BP 128/83 | HR 66 | Temp 98.0°F | Wt 141.3 lb

## 2020-11-21 DIAGNOSIS — Z7901 Long term (current) use of anticoagulants: Secondary | ICD-10-CM | POA: Diagnosis not present

## 2020-11-21 DIAGNOSIS — I5023 Acute on chronic systolic (congestive) heart failure: Secondary | ICD-10-CM | POA: Diagnosis present

## 2020-11-21 DIAGNOSIS — E875 Hyperkalemia: Secondary | ICD-10-CM | POA: Diagnosis present

## 2020-11-21 DIAGNOSIS — N184 Chronic kidney disease, stage 4 (severe): Secondary | ICD-10-CM | POA: Insufficient documentation

## 2020-11-21 DIAGNOSIS — K921 Melena: Secondary | ICD-10-CM | POA: Diagnosis present

## 2020-11-21 DIAGNOSIS — Z880 Allergy status to penicillin: Secondary | ICD-10-CM

## 2020-11-21 DIAGNOSIS — I5082 Biventricular heart failure: Secondary | ICD-10-CM | POA: Diagnosis present

## 2020-11-21 DIAGNOSIS — R34 Anuria and oliguria: Secondary | ICD-10-CM | POA: Diagnosis present

## 2020-11-21 DIAGNOSIS — I340 Nonrheumatic mitral (valve) insufficiency: Secondary | ICD-10-CM | POA: Diagnosis not present

## 2020-11-21 DIAGNOSIS — I130 Hypertensive heart and chronic kidney disease with heart failure and stage 1 through stage 4 chronic kidney disease, or unspecified chronic kidney disease: Secondary | ICD-10-CM | POA: Diagnosis not present

## 2020-11-21 DIAGNOSIS — Z8674 Personal history of sudden cardiac arrest: Secondary | ICD-10-CM

## 2020-11-21 DIAGNOSIS — I129 Hypertensive chronic kidney disease with stage 1 through stage 4 chronic kidney disease, or unspecified chronic kidney disease: Secondary | ICD-10-CM | POA: Diagnosis present

## 2020-11-21 DIAGNOSIS — E785 Hyperlipidemia, unspecified: Secondary | ICD-10-CM | POA: Diagnosis not present

## 2020-11-21 DIAGNOSIS — E1122 Type 2 diabetes mellitus with diabetic chronic kidney disease: Secondary | ICD-10-CM

## 2020-11-21 DIAGNOSIS — R570 Cardiogenic shock: Secondary | ICD-10-CM | POA: Diagnosis not present

## 2020-11-21 DIAGNOSIS — I5043 Acute on chronic combined systolic (congestive) and diastolic (congestive) heart failure: Secondary | ICD-10-CM | POA: Diagnosis present

## 2020-11-21 DIAGNOSIS — Z87442 Personal history of urinary calculi: Secondary | ICD-10-CM

## 2020-11-21 DIAGNOSIS — D696 Thrombocytopenia, unspecified: Secondary | ICD-10-CM | POA: Diagnosis present

## 2020-11-21 DIAGNOSIS — I361 Nonrheumatic tricuspid (valve) insufficiency: Secondary | ICD-10-CM | POA: Diagnosis not present

## 2020-11-21 DIAGNOSIS — E0590 Thyrotoxicosis, unspecified without thyrotoxic crisis or storm: Secondary | ICD-10-CM | POA: Diagnosis not present

## 2020-11-21 DIAGNOSIS — I455 Other specified heart block: Secondary | ICD-10-CM | POA: Diagnosis present

## 2020-11-21 DIAGNOSIS — Z66 Do not resuscitate: Secondary | ICD-10-CM | POA: Diagnosis not present

## 2020-11-21 DIAGNOSIS — I2510 Atherosclerotic heart disease of native coronary artery without angina pectoris: Secondary | ICD-10-CM | POA: Diagnosis present

## 2020-11-21 DIAGNOSIS — F17210 Nicotine dependence, cigarettes, uncomplicated: Secondary | ICD-10-CM | POA: Diagnosis present

## 2020-11-21 DIAGNOSIS — Z950 Presence of cardiac pacemaker: Secondary | ICD-10-CM

## 2020-11-21 DIAGNOSIS — I480 Paroxysmal atrial fibrillation: Secondary | ICD-10-CM | POA: Diagnosis not present

## 2020-11-21 DIAGNOSIS — E8770 Fluid overload, unspecified: Secondary | ICD-10-CM

## 2020-11-21 DIAGNOSIS — R2689 Other abnormalities of gait and mobility: Secondary | ICD-10-CM | POA: Diagnosis present

## 2020-11-21 DIAGNOSIS — Z89512 Acquired absence of left leg below knee: Secondary | ICD-10-CM

## 2020-11-21 DIAGNOSIS — D631 Anemia in chronic kidney disease: Secondary | ICD-10-CM | POA: Diagnosis present

## 2020-11-21 DIAGNOSIS — I442 Atrioventricular block, complete: Secondary | ICD-10-CM | POA: Diagnosis present

## 2020-11-21 DIAGNOSIS — Z794 Long term (current) use of insulin: Secondary | ICD-10-CM | POA: Diagnosis not present

## 2020-11-21 DIAGNOSIS — N049 Nephrotic syndrome with unspecified morphologic changes: Secondary | ICD-10-CM | POA: Diagnosis present

## 2020-11-21 DIAGNOSIS — Z8249 Family history of ischemic heart disease and other diseases of the circulatory system: Secondary | ICD-10-CM

## 2020-11-21 DIAGNOSIS — Q828 Other specified congenital malformations of skin: Secondary | ICD-10-CM

## 2020-11-21 DIAGNOSIS — E119 Type 2 diabetes mellitus without complications: Secondary | ICD-10-CM | POA: Diagnosis not present

## 2020-11-21 DIAGNOSIS — E43 Unspecified severe protein-calorie malnutrition: Secondary | ICD-10-CM | POA: Diagnosis present

## 2020-11-21 DIAGNOSIS — E876 Hypokalemia: Secondary | ICD-10-CM | POA: Diagnosis present

## 2020-11-21 DIAGNOSIS — E7800 Pure hypercholesterolemia, unspecified: Secondary | ICD-10-CM | POA: Diagnosis present

## 2020-11-21 DIAGNOSIS — Z7189 Other specified counseling: Secondary | ICD-10-CM | POA: Diagnosis not present

## 2020-11-21 DIAGNOSIS — D500 Iron deficiency anemia secondary to blood loss (chronic): Secondary | ICD-10-CM | POA: Diagnosis present

## 2020-11-21 DIAGNOSIS — Z96649 Presence of unspecified artificial hip joint: Secondary | ICD-10-CM | POA: Diagnosis present

## 2020-11-21 DIAGNOSIS — N179 Acute kidney failure, unspecified: Secondary | ICD-10-CM | POA: Diagnosis not present

## 2020-11-21 DIAGNOSIS — M79602 Pain in left arm: Secondary | ICD-10-CM

## 2020-11-21 DIAGNOSIS — M7989 Other specified soft tissue disorders: Secondary | ICD-10-CM | POA: Diagnosis not present

## 2020-11-21 DIAGNOSIS — Z515 Encounter for palliative care: Secondary | ICD-10-CM | POA: Diagnosis not present

## 2020-11-21 DIAGNOSIS — I081 Rheumatic disorders of both mitral and tricuspid valves: Secondary | ICD-10-CM | POA: Diagnosis present

## 2020-11-21 DIAGNOSIS — I5021 Acute systolic (congestive) heart failure: Secondary | ICD-10-CM | POA: Diagnosis not present

## 2020-11-21 DIAGNOSIS — I5020 Unspecified systolic (congestive) heart failure: Secondary | ICD-10-CM

## 2020-11-21 DIAGNOSIS — Z823 Family history of stroke: Secondary | ICD-10-CM

## 2020-11-21 DIAGNOSIS — Z833 Family history of diabetes mellitus: Secondary | ICD-10-CM

## 2020-11-21 DIAGNOSIS — I272 Pulmonary hypertension, unspecified: Secondary | ICD-10-CM | POA: Diagnosis present

## 2020-11-21 DIAGNOSIS — J302 Other seasonal allergic rhinitis: Secondary | ICD-10-CM | POA: Diagnosis present

## 2020-11-21 DIAGNOSIS — E871 Hypo-osmolality and hyponatremia: Secondary | ICD-10-CM | POA: Diagnosis not present

## 2020-11-21 DIAGNOSIS — R140 Abdominal distension (gaseous): Secondary | ICD-10-CM

## 2020-11-21 DIAGNOSIS — Z96612 Presence of left artificial shoulder joint: Secondary | ICD-10-CM | POA: Diagnosis present

## 2020-11-21 DIAGNOSIS — I509 Heart failure, unspecified: Secondary | ICD-10-CM

## 2020-11-21 DIAGNOSIS — E1151 Type 2 diabetes mellitus with diabetic peripheral angiopathy without gangrene: Secondary | ICD-10-CM | POA: Diagnosis present

## 2020-11-21 LAB — CBC WITH DIFFERENTIAL/PLATELET
Abs Immature Granulocytes: 0.03 10*3/uL (ref 0.00–0.07)
Basophils Absolute: 0 10*3/uL (ref 0.0–0.1)
Basophils Relative: 0 %
Eosinophils Absolute: 0.2 10*3/uL (ref 0.0–0.5)
Eosinophils Relative: 4 %
HCT: 30.4 % — ABNORMAL LOW (ref 36.0–46.0)
Hemoglobin: 10.1 g/dL — ABNORMAL LOW (ref 12.0–15.0)
Immature Granulocytes: 1 %
Lymphocytes Relative: 16 %
Lymphs Abs: 0.9 10*3/uL (ref 0.7–4.0)
MCH: 29.9 pg (ref 26.0–34.0)
MCHC: 33.2 g/dL (ref 30.0–36.0)
MCV: 89.9 fL (ref 80.0–100.0)
Monocytes Absolute: 0.6 10*3/uL (ref 0.1–1.0)
Monocytes Relative: 11 %
Neutro Abs: 4 10*3/uL (ref 1.7–7.7)
Neutrophils Relative %: 68 %
Platelets: 150 10*3/uL (ref 150–400)
RBC: 3.38 MIL/uL — ABNORMAL LOW (ref 3.87–5.11)
RDW: 17.1 % — ABNORMAL HIGH (ref 11.5–15.5)
WBC: 5.7 10*3/uL (ref 4.0–10.5)
nRBC: 0 % (ref 0.0–0.2)

## 2020-11-21 LAB — COMPREHENSIVE METABOLIC PANEL
ALT: 17 U/L (ref 0–44)
AST: 28 U/L (ref 15–41)
Albumin: 2.6 g/dL — ABNORMAL LOW (ref 3.5–5.0)
Alkaline Phosphatase: 98 U/L (ref 38–126)
Anion gap: 11 (ref 5–15)
BUN: 48 mg/dL — ABNORMAL HIGH (ref 8–23)
CO2: 20 mmol/L — ABNORMAL LOW (ref 22–32)
Calcium: 8.8 mg/dL — ABNORMAL LOW (ref 8.9–10.3)
Chloride: 111 mmol/L (ref 98–111)
Creatinine, Ser: 2.82 mg/dL — ABNORMAL HIGH (ref 0.44–1.00)
GFR, Estimated: 18 mL/min — ABNORMAL LOW (ref >60)
Glucose, Bld: 118 mg/dL — ABNORMAL HIGH (ref 70–99)
Potassium: 5.2 mmol/L — ABNORMAL HIGH (ref 3.5–5.1)
Sodium: 142 mmol/L (ref 135–145)
Total Bilirubin: 2.4 mg/dL — ABNORMAL HIGH (ref 0.3–1.2)
Total Protein: 6.5 g/dL (ref 6.5–8.1)

## 2020-11-21 LAB — GLUCOSE, CAPILLARY
Glucose-Capillary: 151 mg/dL — ABNORMAL HIGH (ref 70–99)
Glucose-Capillary: 138 mg/dL — ABNORMAL HIGH (ref 70–99)

## 2020-11-21 LAB — TROPONIN I (HIGH SENSITIVITY)
Troponin I (High Sensitivity): 31 ng/L — ABNORMAL HIGH (ref <18)
Troponin I (High Sensitivity): 29 ng/L — ABNORMAL HIGH (ref <18)

## 2020-11-21 LAB — SARS CORONAVIRUS 2 (TAT 6-24 HRS): SARS Coronavirus 2: NEGATIVE

## 2020-11-21 LAB — BRAIN NATRIURETIC PEPTIDE: B Natriuretic Peptide: >4500 pg/mL — ABNORMAL HIGH (ref 0.0–100.0)

## 2020-11-21 MED ORDER — IPRATROPIUM-ALBUTEROL 0.5-2.5 (3) MG/3ML IN SOLN: 3.0000 mL | Freq: Four times a day (QID) | RESPIRATORY_TRACT | Status: DC | PRN

## 2020-11-21 MED ORDER — GABAPENTIN 300 MG PO CAPS
300.0000 mg | ORAL_CAPSULE | Freq: Every day | ORAL | Status: DC
  Administered 2020-11-21 – 2020-11-26 (×6): 300 mg via ORAL
  Filled 2020-11-21 (×6): qty 1

## 2020-11-21 MED ORDER — POTASSIUM CHLORIDE ER 10 MEQ PO TBCR: 10.0000 meq | EXTENDED_RELEASE_TABLET | Freq: Every day | ORAL | Status: DC

## 2020-11-21 MED ORDER — ATORVASTATIN CALCIUM 40 MG PO TABS
40.0000 mg | ORAL_TABLET | Freq: Every day | ORAL | Status: DC
  Administered 2020-11-21 – 2020-12-08 (×18): 40 mg via ORAL
  Filled 2020-11-21 (×18): qty 1

## 2020-11-21 MED ORDER — FUROSEMIDE 10 MG/ML IJ SOLN
40.0000 mg | Freq: Once | INTRAMUSCULAR | Status: DC
  Administered 2020-11-21: 40 mg via INTRAVENOUS

## 2020-11-21 MED ORDER — IPRATROPIUM-ALBUTEROL 0.5-2.5 (3) MG/3ML IN SOLN
3.0000 mL | Freq: Four times a day (QID) | RESPIRATORY_TRACT | Status: DC | PRN
  Filled 2020-11-21: qty 3

## 2020-11-21 MED ORDER — FUROSEMIDE 10 MG/ML IJ SOLN
40.0000 mg | Freq: Once | INTRAMUSCULAR | Status: DC
  Filled 2020-11-21 (×2): qty 4

## 2020-11-21 MED ORDER — IRBESARTAN 300 MG PO TABS
150.0000 mg | ORAL_TABLET | Freq: Every day | ORAL | Status: DC
  Administered 2020-11-21 – 2020-11-22 (×2): 150 mg via ORAL
  Filled 2020-11-21 (×2): qty 1

## 2020-11-21 MED ORDER — CALCITRIOL 0.5 MCG PO CAPS
0.5000 ug | ORAL_CAPSULE | Freq: Every day | ORAL | Status: DC
  Administered 2020-11-21 – 2020-12-08 (×18): 0.5 ug via ORAL
  Filled 2020-11-21 (×18): qty 1

## 2020-11-21 MED ORDER — IPRATROPIUM-ALBUTEROL 0.5-2.5 (3) MG/3ML IN SOLN
3.0000 mL | Freq: Four times a day (QID) | RESPIRATORY_TRACT | Status: DC
  Administered 2020-11-21: 3 mL via RESPIRATORY_TRACT
  Filled 2020-11-21: qty 3

## 2020-11-21 MED ORDER — CARVEDILOL 12.5 MG PO TABS
12.5000 mg | ORAL_TABLET | Freq: Two times a day (BID) | ORAL | Status: DC
  Administered 2020-11-21 – 2020-11-29 (×16): 12.5 mg via ORAL
  Filled 2020-11-21 (×16): qty 1

## 2020-11-21 MED ORDER — LOPERAMIDE HCL 2 MG PO CAPS
2.0000 mg | ORAL_CAPSULE | Freq: Four times a day (QID) | ORAL | Status: DC | PRN
  Administered 2020-11-23 – 2020-11-26 (×8): 2 mg via ORAL
  Filled 2020-11-21 (×8): qty 1

## 2020-11-21 MED ORDER — RIVAROXABAN 15 MG PO TABS
15.0000 mg | ORAL_TABLET | Freq: Every day | ORAL | Status: DC
  Administered 2020-11-21 – 2020-11-29 (×10): 15 mg via ORAL
  Filled 2020-11-21 (×10): qty 1

## 2020-11-21 MED ORDER — HYDROXYZINE HCL 10 MG PO TABS
10.0000 mg | ORAL_TABLET | Freq: Two times a day (BID) | ORAL | Status: DC | PRN
  Administered 2020-11-22 – 2020-12-02 (×7): 10 mg via ORAL
  Filled 2020-11-21 (×8): qty 1

## 2020-11-21 MED ORDER — ISOSORB DINITRATE-HYDRALAZINE 20-37.5 MG PO TABS: 1.0000 | ORAL_TABLET | Freq: Three times a day (TID) | ORAL | Status: DC

## 2020-11-21 MED ORDER — ACETAMINOPHEN 325 MG PO TABS
650.0000 mg | ORAL_TABLET | Freq: Four times a day (QID) | ORAL | Status: DC | PRN
Start: 2020-11-21 — End: 2020-12-03
  Administered 2020-11-27 – 2020-12-03 (×5): 650 mg via ORAL
  Filled 2020-11-21 (×5): qty 2

## 2020-11-21 MED ORDER — INSULIN ASPART 100 UNIT/ML ~~LOC~~ SOLN: 0.0000 [IU] | Freq: Three times a day (TID) | SUBCUTANEOUS | Status: DC

## 2020-11-21 MED ORDER — FUROSEMIDE 10 MG/ML IJ SOLN
40.0000 mg | Freq: Once | INTRAMUSCULAR | Status: AC
  Administered 2020-11-21: 40 mg via INTRAVENOUS
  Filled 2020-11-21: qty 4

## 2020-11-21 MED ORDER — IPRATROPIUM-ALBUTEROL 0.5-2.5 (3) MG/3ML IN SOLN: 3.0000 mL | Freq: Four times a day (QID) | RESPIRATORY_TRACT | Status: DC

## 2020-11-21 MED ORDER — SODIUM BICARBONATE 650 MG PO TABS
1300.0000 mg | ORAL_TABLET | Freq: Three times a day (TID) | ORAL | Status: DC
  Administered 2020-11-21 – 2020-11-27 (×18): 1300 mg via ORAL
  Filled 2020-11-21 (×18): qty 2

## 2020-11-21 MED ORDER — ACETAMINOPHEN 650 MG RE SUPP
650.0000 mg | Freq: Four times a day (QID) | RECTAL | Status: DC | PRN
Start: 2020-11-21 — End: 2020-12-03

## 2020-11-21 NOTE — Telephone Encounter (Signed)
Pt is here this morning for HFU appt with Dr Wynetta Emery.
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
## 2020-11-21 NOTE — Assessment & Plan Note (Addendum)
SUBJECTIVE: [name] is a 70 year old female with past medical history significant for CKD4, CAD, HFrEF 20-25%, paroxysmal atrial fibrillation s/p PPM, T2DM, HTN, HLD, L BKA with recent hospital admission from 02/13 to 02/16 for generalized pruritus found to have new onset hyperthyroidism of unknown etiology who presents to clinic for hospital follow-up.  Patient reports that since discharge, she has been experiencing shortness of breath constantly which is worse with minimal exertion. She denies shortness of breath with lying flat, nocturnal dyspnea, cough, fevers, chills. She states that she has noticed progressive swelling of her bilateral upper extremities as well as her bilateral lower extremities and abdomen. She reports that her left upper extremity swelling is most bothersome for her and she has pain throughout the extremity. Initially, patient reported that she has not been taking furosemide '80mg'$  daily which is listed as a PRN medication in her chart, however upon review of her medication list she reports that she has been taking all of her medications as prescribed with infrequent missing of doses. She does not use a medication organizer and reports that she takes pills directly out of the vials multiple times daily. She has no other complaints or concerns.  OBJECTIVE: Vitals: Wt 141lb (114lb on 08/30/2020)  BP 162/54  HR 70  SpO2 100%  Temp 98.1  Physical examination: General - Elderly female sitting in wheelchair in no acute distress. Respiratory - On room air, tachypneic, increased work of breathing, rales in bilateral lower lobes. Cardiac - Regular rate and rhythm, no murmurs/rubs/gallops. Musculoskeletal - 3+ pitting edema of left upper extremity with significant tenderness to palpation. Trace pitting edema of right upper extremity. Tense edema of right lower extremity below the knee with 1+ pitting edema of the left lower extremity above the knee. Neck - JVD to the mid  neck Skin - Ecchymoses of left upper extremity.  ASSESSMENT/PLAN: Patient with history significant for CKD4 and HFrEF presenting to clinic following recent hospitalization found to have new onset hyperthyroidism of unknown etiology presenting to clinic for evaluation of hypervolemia, dyspnea and left upper extremity swelling and pain with concern for hypervolemia secondary to HFrEF exacerbation versus progression to renal failure and left upper extremity DVT. Patient would benefit from admission for further evaluation and management of her condition. -Stat labs: CBC, CMP, BNP, D-dimer, COVID PCR -Stat imaging: CXR and LUE venous dupplex -Clinic administered medications: IV lasix '40mg'$ 

## 2020-11-21 NOTE — Plan of Care (Signed)

## 2020-11-21 NOTE — Progress Notes (Signed)
Internal Medicine Clinic Attending  CCM services provided by the care management provider and their documentation were discussed with Dr. Steen. We reviewed the pertinent findings, urgent action items addressed by the resident and non-urgent items to be addressed by the PCP.  I agree with the assessment, diagnosis, and plan of care documented in the CCM and resident's note.  Emily Mullen, MD 11/21/2020  

## 2020-11-21 NOTE — Addendum Note (Signed)
Addended by: Lyndal Pulley on: 11/21/2020 09:22 AM   Modules accepted: Orders
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
## 2020-11-21 NOTE — Progress Notes (Signed)
Internal Medicine Clinic Resident  Referral to clinical pharmacist placed.   I have personally reviewed this encounter including the documentation in this note and/or discussed this patient with the care management provider. I will address any urgent items identified by the care management provider and will communicate my actions to the patient's PCP. I have reviewed the patient's CCM visit with my supervising attending, Dr Daryll Drown.  Lorene Dy, MD 11/21/2020

## 2020-11-22 ENCOUNTER — Encounter (HOSPITAL_COMMUNITY): Payer: Medicare Other

## 2020-11-22 ENCOUNTER — Inpatient Hospital Stay (HOSPITAL_COMMUNITY): Payer: Medicare Other

## 2020-11-22 DIAGNOSIS — N184 Chronic kidney disease, stage 4 (severe): Secondary | ICD-10-CM | POA: Diagnosis not present

## 2020-11-22 DIAGNOSIS — I5021 Acute systolic (congestive) heart failure: Secondary | ICD-10-CM

## 2020-11-22 DIAGNOSIS — M7989 Other specified soft tissue disorders: Secondary | ICD-10-CM | POA: Diagnosis not present

## 2020-11-22 DIAGNOSIS — I130 Hypertensive heart and chronic kidney disease with heart failure and stage 1 through stage 4 chronic kidney disease, or unspecified chronic kidney disease: Principal | ICD-10-CM

## 2020-11-22 DIAGNOSIS — E785 Hyperlipidemia, unspecified: Secondary | ICD-10-CM

## 2020-11-22 DIAGNOSIS — I5020 Unspecified systolic (congestive) heart failure: Secondary | ICD-10-CM | POA: Diagnosis not present

## 2020-11-22 DIAGNOSIS — I5023 Acute on chronic systolic (congestive) heart failure: Secondary | ICD-10-CM | POA: Diagnosis not present

## 2020-11-22 DIAGNOSIS — E0590 Thyrotoxicosis, unspecified without thyrotoxic crisis or storm: Secondary | ICD-10-CM

## 2020-11-22 DIAGNOSIS — Z7901 Long term (current) use of anticoagulants: Secondary | ICD-10-CM

## 2020-11-22 DIAGNOSIS — I2510 Atherosclerotic heart disease of native coronary artery without angina pectoris: Secondary | ICD-10-CM

## 2020-11-22 DIAGNOSIS — Z950 Presence of cardiac pacemaker: Secondary | ICD-10-CM

## 2020-11-22 LAB — ECHOCARDIOGRAM COMPLETE
Area-P 1/2: 4.31 cm2
Calc EF: 34.7 %
MV VTI: 1.28 cm2
S' Lateral: 3.4 cm
Single Plane A2C EF: 39.9 %
Single Plane A4C EF: 35.5 %
Weight: 2275.15 oz

## 2020-11-22 LAB — CBC
HCT: 29.1 % — ABNORMAL LOW (ref 36.0–46.0)
Hemoglobin: 9.3 g/dL — ABNORMAL LOW (ref 12.0–15.0)
MCH: 28.4 pg (ref 26.0–34.0)
MCHC: 32 g/dL (ref 30.0–36.0)
MCV: 89 fL (ref 80.0–100.0)
Platelets: 122 10*3/uL — ABNORMAL LOW (ref 150–400)
RBC: 3.27 MIL/uL — ABNORMAL LOW (ref 3.87–5.11)
RDW: 16.7 % — ABNORMAL HIGH (ref 11.5–15.5)
WBC: 5.8 10*3/uL (ref 4.0–10.5)
nRBC: 0 % (ref 0.0–0.2)

## 2020-11-22 LAB — COMPREHENSIVE METABOLIC PANEL
ALT: 16 U/L (ref 0–44)
AST: 21 U/L (ref 15–41)
Albumin: 2.4 g/dL — ABNORMAL LOW (ref 3.5–5.0)
Alkaline Phosphatase: 89 U/L (ref 38–126)
Anion gap: 13 (ref 5–15)
BUN: 50 mg/dL — ABNORMAL HIGH (ref 8–23)
CO2: 17 mmol/L — ABNORMAL LOW (ref 22–32)
Calcium: 8.7 mg/dL — ABNORMAL LOW (ref 8.9–10.3)
Chloride: 112 mmol/L — ABNORMAL HIGH (ref 98–111)
Creatinine, Ser: 3.11 mg/dL — ABNORMAL HIGH (ref 0.44–1.00)
GFR, Estimated: 16 mL/min — ABNORMAL LOW (ref >60)
Glucose, Bld: 92 mg/dL (ref 70–99)
Potassium: 5.1 mmol/L (ref 3.5–5.1)
Sodium: 142 mmol/L (ref 135–145)
Total Bilirubin: 1.6 mg/dL — ABNORMAL HIGH (ref 0.3–1.2)
Total Protein: 5.8 g/dL — ABNORMAL LOW (ref 6.5–8.1)

## 2020-11-22 LAB — GLUCOSE, CAPILLARY
Glucose-Capillary: 92 mg/dL (ref 70–99)
Glucose-Capillary: 81 mg/dL (ref 70–99)
Glucose-Capillary: 111 mg/dL — ABNORMAL HIGH (ref 70–99)

## 2020-11-22 LAB — T4, FREE: Free T4: 1.3 ng/dL — ABNORMAL HIGH (ref 0.61–1.12)

## 2020-11-22 LAB — MAGNESIUM: Magnesium: 1.9 mg/dL (ref 1.7–2.4)

## 2020-11-22 LAB — TSH: TSH: 0.014 u[IU]/mL — ABNORMAL LOW (ref 0.350–4.500)

## 2020-11-22 MED ORDER — FUROSEMIDE 10 MG/ML IJ SOLN
40.0000 mg | Freq: Once | INTRAMUSCULAR | Status: AC
  Administered 2020-11-22: 40 mg via INTRAVENOUS
  Filled 2020-11-22: qty 4

## 2020-11-22 MED ORDER — FUROSEMIDE 10 MG/ML IJ SOLN
80.0000 mg | Freq: Every day | INTRAMUSCULAR | Status: DC
  Administered 2020-11-23: 80 mg via INTRAVENOUS
  Filled 2020-11-22: qty 8

## 2020-11-22 NOTE — Progress Notes (Signed)
HD#1 Subjective:  Overnight Events: None   Patient is seen at bedside. She appears comfortable. She reports intermittent dyspnea with exertion. She would like to go home.  States that left arm swelling improved but leg edema is the same.   Objective:  Vital signs in last 24 hours: Vitals:   11/21/20 1433 11/21/20 2037 11/21/20 2100 11/22/20 0500  BP: 130/68 (!) 146/93  (!) 118/56  Pulse: 70 70 (!) 55 70  Resp: '16  16 18  '$ Temp: (!) 97.5 F (36.4 C) 98.4 F (36.9 C)  98 F (36.7 C)  TempSrc: Oral Oral  Oral  SpO2: 100% 100% 100% 98%  Weight:    64.5 kg   Supplemental O2: Room Air SpO2: 98 %  Physical Exam:  Physical Exam Constitutional:      General: She is not in acute distress.    Appearance: Normal appearance.  HENT:     Head: Normocephalic and atraumatic.     Right Ear: External ear normal.     Left Ear: External ear normal.     Nose: Nose normal.     Mouth/Throat:     Mouth: Mucous membranes are moist.     Pharynx: Oropharynx is clear.  Eyes:     Extraocular Movements: Extraocular movements intact.     Pupils: Pupils are equal, round, and reactive to light.  Cardiovascular:     Rate and Rhythm: Normal rate and regular rhythm.     Pulses: Normal pulses.     Comments: JVD to the mid neck sitting at 90 degrees Pulmonary:     Breath sounds: Wheezing (lung bases L>R) present.  Abdominal:     General: Abdomen is flat. There is no distension.     Tenderness: There is abdominal tenderness (mild lower abdominal ).  Musculoskeletal:     Right lower leg: Edema present.     Left lower leg: Edema present.     Comments: LUE edema  Skin:    General: Skin is warm and dry.     Capillary Refill: Capillary refill takes less than 2 seconds.  Neurological:     General: No focal deficit present.     Mental Status: She is alert and oriented to person, place, and time. Mental status is at baseline.    Filed Weights   11/22/20 0500  Weight: 64.5 kg     Intake/Output  Summary (Last 24 hours) at 11/22/2020 0619 Last data filed at 11/22/2020 0327 Gross per 24 hour  Intake 238 ml  Output 300 ml  Net -62 ml   Net IO Since Admission: -62 mL [11/22/20 0619]  Pertinent Labs: CBC Latest Ref Rng & Units 11/22/2020 11/21/2020 11/14/2020  WBC 4.0 - 10.5 K/uL 5.8 5.7 5.7  Hemoglobin 12.0 - 15.0 g/dL 9.3(L) 10.1(L) 9.5(L)  Hematocrit 36.0 - 46.0 % 29.1(L) 30.4(L) 28.9(L)  Platelets 150 - 400 K/uL 122(L) 150 135(L)    CMP Latest Ref Rng & Units 11/22/2020 11/21/2020 11/14/2020  Glucose 70 - 99 mg/dL 92 118(H) 80  BUN 8 - 23 mg/dL 50(H) 48(H) 42(H)  Creatinine 0.44 - 1.00 mg/dL 3.11(H) 2.82(H) 2.71(H)  Sodium 135 - 145 mmol/L 142 142 138  Potassium 3.5 - 5.1 mmol/L 5.1 5.2(H) 4.8  Chloride 98 - 111 mmol/L 112(H) 111 111  CO2 22 - 32 mmol/L 17(L) 20(L) 19(L)  Calcium 8.9 - 10.3 mg/dL 8.7(L) 8.8(L) 8.1(L)  Total Protein 6.5 - 8.1 g/dL 5.8(L) 6.5 5.1(L)  Total Bilirubin 0.3 - 1.2 mg/dL 1.6(H)  2.4(H) 2.1(H)  Alkaline Phos 38 - 126 U/L 89 98 86  AST 15 - 41 U/L '21 28 23  '$ ALT 0 - 44 U/L '16 17 14    '$ Imaging: DG Chest 2 View  Result Date: 11/21/2020 CLINICAL DATA:  Shortness of breath. EXAM: CHEST - 2 VIEW COMPARISON:  11/11/2020. FINDINGS: Cardiac pacer in stable position. Cardiomegaly. Mild bilateral interstitial prominence and small bilateral pleural effusions. Findings suggest CHF. Pneumonitis cannot be excluded. Findings have increased slightly from prior exam. IMPRESSION: Cardiac pacer in stable position. Cardiomegaly. Mild bilateral interstitial prominence and small bilateral pleural effusions. Findings suggest CHF. Findings have increased slightly from prior exam. Electronically Signed   By: [place]   On: 11/21/2020 13:14    Assessment/Plan:   Principal Problem:   Acute exacerbation of CHF (congestive heart failure) [facility])  Patient Summary: [name] [name]. [name] [name] is a 70 yo female with PMH of CAD w/sinus arrest now s/p pacemaker, HFrEF (EF 20%), paroxysmal  atrial fibrillation on eliquis, chronic diarrhea, TIIDM s/p left BKA, PVD, hyperthyroidism, CKD Stage IV, and normocytic anemia presenting with increasing shortness of breath and weight gain since hospital discharge on 2/16.  HFrEF Exacerbation CAD s/p pacemaker, Hx of sinus arrest Moderate Mitral Regurge, Severe Tricuspid Regurge Paroxysmal atrial fibrillation Patients reports she is no longer having SOB. Does note legs are swollen and cannot use her prosthesis because of this. PPM check yesterday with episodes of aflutter. No flutter on tele or on EKG here.  On exam appears fluid overloaded with pitting edema of bilateral lower extremities and elevated JVD. 300 mL urine output of IV Lasix 40 mg. Will increase IV diuretics. Chronic HF thought to be cardiomyopathy secondary to RV Pacing. With her chronic HF and 06/2020 findings, it was previously considered to replace her pacemaker. Patient deferred at that time. Discussed this with Cardiology recommending outpatient follow up for this once acute HF exacerbation is treated. - Increase lasix to 80 mg daily - cont. Coreg 12.5 mg bid, hold entresto and statin - follow up TTE - cont. xarelto - strict I/Os, daily weights  - BMP, Mg  LUE Swelling Improved today, +radial pulse, non-tender. She is on xarelto at home. DVT study without evidence for DVT.   Hyperthyroidism Diagnosed during last admission 2/13-2/16. Radioiodine study was ordered but she has not yet been able to complete this.  - TSH of 0.01, T4 elevated but improved 2.5>1.3  CKD Stage IV Cr up to 3.11 this morning.  - cont. Na bicarb  - hold entresto and statin  Hypertension HLD - switch valsartan to irbesartan  - hold  bidil and lipitor  Diet: HH/CM VTE: Xarelto IVF: none Code: full    Dispo: Anticipated discharge to Home in 2 days pending further workup.   [name] Beard, MD 11/22/2020, 6:19 AM Pager: [phone]  Please contact the on call pager after 5 pm and  on weekends at [phone].

## 2020-11-22 NOTE — Progress Notes (Signed)
Internal Medicine Clinic Attending  I saw and evaluated the patient.  I personally confirmed the key portions of the history and exam documented by Dr. Katsadouros and I reviewed pertinent patient test results.  The assessment, diagnosis, and plan were formulated together and I agree with the documentation in the resident's note.  

## 2020-11-22 NOTE — Plan of Care (Signed)

## 2020-11-22 NOTE — Progress Notes (Signed)
Left upper ext venous US completed.    Please see CV Proc for preliminary results.   Lisa Gibson, RVT  

## 2020-11-22 NOTE — Progress Notes (Signed)
Echocardiogram 2D Echocardiogram has been performed.  [name] [name] 11/22/2020, 11:48 AM

## 2020-11-23 DIAGNOSIS — I5023 Acute on chronic systolic (congestive) heart failure: Secondary | ICD-10-CM | POA: Diagnosis not present

## 2020-11-23 DIAGNOSIS — N184 Chronic kidney disease, stage 4 (severe): Secondary | ICD-10-CM | POA: Diagnosis not present

## 2020-11-23 DIAGNOSIS — I130 Hypertensive heart and chronic kidney disease with heart failure and stage 1 through stage 4 chronic kidney disease, or unspecified chronic kidney disease: Secondary | ICD-10-CM | POA: Diagnosis not present

## 2020-11-23 DIAGNOSIS — E785 Hyperlipidemia, unspecified: Secondary | ICD-10-CM | POA: Diagnosis not present

## 2020-11-23 DIAGNOSIS — I5021 Acute systolic (congestive) heart failure: Secondary | ICD-10-CM | POA: Diagnosis not present

## 2020-11-23 LAB — BASIC METABOLIC PANEL
Anion gap: 9 (ref 5–15)
BUN: 50 mg/dL — ABNORMAL HIGH (ref 8–23)
CO2: 23 mmol/L (ref 22–32)
Calcium: 8.4 mg/dL — ABNORMAL LOW (ref 8.9–10.3)
Chloride: 106 mmol/L (ref 98–111)
Creatinine, Ser: 2.97 mg/dL — ABNORMAL HIGH (ref 0.44–1.00)
GFR, Estimated: 17 mL/min — ABNORMAL LOW (ref >60)
Glucose, Bld: 105 mg/dL — ABNORMAL HIGH (ref 70–99)
Potassium: 4.7 mmol/L (ref 3.5–5.1)
Sodium: 138 mmol/L (ref 135–145)

## 2020-11-23 LAB — GLUCOSE, CAPILLARY
Glucose-Capillary: 111 mg/dL — ABNORMAL HIGH (ref 70–99)
Glucose-Capillary: 192 mg/dL — ABNORMAL HIGH (ref 70–99)
Glucose-Capillary: 88 mg/dL (ref 70–99)
Glucose-Capillary: 107 mg/dL — ABNORMAL HIGH (ref 70–99)
Glucose-Capillary: 157 mg/dL — ABNORMAL HIGH (ref 70–99)

## 2020-11-23 LAB — CBC
HCT: 27.7 % — ABNORMAL LOW (ref 36.0–46.0)
Hemoglobin: 9.2 g/dL — ABNORMAL LOW (ref 12.0–15.0)
MCH: 29.5 pg (ref 26.0–34.0)
MCHC: 33.2 g/dL (ref 30.0–36.0)
MCV: 88.8 fL (ref 80.0–100.0)
Platelets: 138 10*3/uL — ABNORMAL LOW (ref 150–400)
RBC: 3.12 MIL/uL — ABNORMAL LOW (ref 3.87–5.11)
RDW: 16.8 % — ABNORMAL HIGH (ref 11.5–15.5)
WBC: 4.8 10*3/uL (ref 4.0–10.5)
nRBC: 0 % (ref 0.0–0.2)

## 2020-11-23 LAB — MAGNESIUM: Magnesium: 1.9 mg/dL (ref 1.7–2.4)

## 2020-11-23 MED ORDER — POLYETHYLENE GLYCOL 3350 17 G PO PACK: 17.0000 g | PACK | Freq: Every day | ORAL | Status: DC | PRN

## 2020-11-23 MED ORDER — ALBUTEROL SULFATE HFA 108 (90 BASE) MCG/ACT IN AERS: 2.0000 | INHALATION_SPRAY | Freq: Four times a day (QID) | RESPIRATORY_TRACT | Status: DC | PRN

## 2020-11-23 MED ORDER — ALBUTEROL SULFATE (2.5 MG/3ML) 0.083% IN NEBU
2.5000 mg | INHALATION_SOLUTION | Freq: Four times a day (QID) | RESPIRATORY_TRACT | Status: DC | PRN
  Administered 2020-11-23: 2.5 mg via RESPIRATORY_TRACT
  Filled 2020-11-23: qty 3

## 2020-11-23 MED ORDER — POLYETHYLENE GLYCOL 3350 17 G PO PACK: 17.0000 g | PACK | Freq: Every day | ORAL | Status: DC

## 2020-11-23 NOTE — Plan of Care (Signed)

## 2020-11-23 NOTE — Progress Notes (Addendum)
HD#2 Subjective:  Overnight Events: None  Patient is seen at bedside. Patient reports that her LE and UE edema is not improving. Reports no issue with breathing. Last BM was this morning.   Objective:  Vital signs in last 24 hours: Vitals:   11/22/20 0900 11/22/20 1643 11/22/20 1941 11/23/20 0349  BP: (!) 141/56 (!) 119/54 (!) 126/57 (!) 115/58  Pulse: 70 69 70 70  Resp: '16 16  16  '$ Temp: 98 F (36.7 C) 97.8 F (36.6 C) 97.7 F (36.5 C) 98.3 F (36.8 C)  TempSrc: Oral Oral Oral Oral  SpO2: 95% 95% 100% 100%  Weight:    66.8 kg   Supplemental O2: Room Air SpO2: 100 %  Physical Exam:  Physical Exam Constitutional:      General: She is not in acute distress.    Appearance: Normal appearance.  HENT:     Head: Normocephalic and atraumatic.     Right Ear: External ear normal.     Left Ear: External ear normal.     Nose: Nose normal.     Mouth/Throat:     Mouth: Mucous membranes are moist.     Pharynx: Oropharynx is clear.  Eyes:     Extraocular Movements: Extraocular movements intact.     Pupils: Pupils are equal, round, and reactive to light.  Cardiovascular:     Rate and Rhythm: Normal rate and regular rhythm.     Pulses: Normal pulses.     Comments: JVD to the mid neck sitting at 90 degrees Pulmonary:     Effort: Pulmonary effort is normal. No respiratory distress.     Breath sounds: Wheezing (mild) present.  Abdominal:     General: Abdomen is flat. There is no distension.     Tenderness: There is abdominal tenderness (mild lower abdominal ).  Musculoskeletal:     Right lower leg: Edema present.     Left lower leg: Edema present.     Comments: LUE edema similar to yesterday  Skin:    General: Skin is warm and dry.     Capillary Refill: Capillary refill takes less than 2 seconds.  Neurological:     General: No focal deficit present.     Mental Status: She is alert and oriented to person, place, and time. Mental status is at baseline.    Filed Weights    11/22/20 0500 11/23/20 0349  Weight: 64.5 kg 66.8 kg     Intake/Output Summary (Last 24 hours) at 11/23/2020 [id number] Last data filed at 11/22/2020 2100 Gross per 24 hour  Intake 530 ml  Output 1150 ml  Net -620 ml   Net IO Since Admission: -682 mL [11/23/20 0611]  Pertinent Labs: CBC Latest Ref Rng & Units 11/23/2020 11/22/2020 11/21/2020  WBC 4.0 - 10.5 K/uL 4.8 5.8 5.7  Hemoglobin 12.0 - 15.0 g/dL 9.2(L) 9.3(L) 10.1(L)  Hematocrit 36.0 - 46.0 % 27.7(L) 29.1(L) 30.4(L)  Platelets 150 - 400 K/uL 138(L) 122(L) 150    CMP Latest Ref Rng & Units 11/23/2020 11/22/2020 11/21/2020  Glucose 70 - 99 mg/dL 105(H) 92 118(H)  BUN 8 - 23 mg/dL 50(H) 50(H) 48(H)  Creatinine 0.44 - 1.00 mg/dL 2.97(H) 3.11(H) 2.82(H)  Sodium 135 - 145 mmol/L 138 142 142  Potassium 3.5 - 5.1 mmol/L 4.7 5.1 5.2(H)  Chloride 98 - 111 mmol/L 106 112(H) 111  CO2 22 - 32 mmol/L 23 17(L) 20(L)  Calcium 8.9 - 10.3 mg/dL 8.4(L) 8.7(L) 8.8(L)  Total Protein 6.5 - 8.1  g/dL - 5.8(L) 6.5  Total Bilirubin 0.3 - 1.2 mg/dL - 1.6(H) 2.4(H)  Alkaline Phos 38 - 126 U/L - 89 98  AST 15 - 41 U/L - 21 28  ALT 0 - 44 U/L - 16 17    Imaging: ECHOCARDIOGRAM COMPLETE  1. Left ventricular ejection fraction, by estimation, is 30 to 35%. The  left ventricle has moderately decreased function. The left ventricle  demonstrates global hypokinesis. Left ventricular diastolic parameters are  consistent with Grade II diastolic  dysfunction (pseudonormalization).   2. Right ventricular systolic function is normal. The right ventricular  size is normal. There is moderately elevated pulmonary artery systolic  pressure. The estimated right ventricular systolic pressure is 99991111 mmHg.   3. Left atrial size was severely dilated.   4. Right atrial size was moderately dilated.   5. The mitral valve is normal in structure. Mild mitral valve  regurgitation. No evidence of mitral stenosis.   6. Tricuspid valve regurgitation is severe.   7. The aortic  valve is normal in structure. Aortic valve regurgitation is  not visualized. No aortic stenosis is present.   8. The inferior vena cava is normal in size with greater than 50%  respiratory variability, suggesting right atrial pressure of 3 mmHg.  VAS [place]  UPPER EXTREMITY VENOUS DUPLEX Left: No evidence of deep vein thrombosis in the upper extremity. No evidence of superficial vein thrombosis in the upper extremity.  *See table(s) above for measurements and observations.  Diagnosing physician: Jamelle Haring Electronically signed by Jamelle Haring on 11/22/2020 at 8:13:33 PM.    Final     Assessment/Plan:   Principal Problem:   Acute exacerbation of CHF (congestive heart failure) ([place]) Active Problems:   Complete heart block ([place])   CKD stage 4 secondary to hypertension ([place])   Diabetes mellitus ([place])   Paroxysmal atrial fibrillation [facility])  Patient Summary: [name] [name]. Wilt is a 70 yo female with PMH of CAD w/sinus arrest now s/p pacemaker, HFrEF (EF 20%), paroxysmal atrial fibrillation on eliquis, chronic diarrhea, TIIDM s/p left BKA, PVD, hyperthyroidism, CKD Stage IV, and normocytic anemia presenting with increasing shortness of breath and weight gain since hospital discharge on 2/16.   HFrEF Exacerbation CAD s/p pacemaker, Hx of sinus arrest Moderate Mitral Regurge, Severe Tricuspid Regurge Paroxysmal atrial fibrillation Continues to feel well but feels leg is about the same. 1.9 L urine out this morning. Remains fluid overloaded with pitting edema of bilateral lower extremities and elevated JVD. 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 Echo with improved EF to 30-35% otherwise similar to prior. Will continue on IV diuresis.  - lasix to 80 mg daily - cont. Coreg 12.5 mg bid, hold entresto and statin - cont. xarelto - strict I/Os, daily weights  - monitor electrolytes    AKI on CKD Stage IV Likely due to  ADHF, Cr improved to 2.9 this morning.  - cont. Na bicarb  - hold entresto and statin - monitor BMP  Diabetes type II On home Jardiance. Refusing SSI. CBGs  Mostly well controlled during admission. Will continue to monitor.  - CBG monitor   Hypertension HLD BP remains well controlled off medications. - hold valsartan to irbesartan  - hold  bidil and lipitor  Hyperthyroidism Diagnosed during last admission 2/13-2/16. Radioiodine study was ordered but she has not yet been able to complete this.  - TSH of 0.01, T4 elevated but improved 2.5>1.3 - Outpatient raiodiodine  uptake study, repeat TSH and T4    Diet: HH/CM VTE: Xarelto IVF: none Code: full   Dispo: Anticipated discharge to Home in 1-2 days pending further diureses.    [name], MD 11/23/2020, 6:11 AM Pager: [phone]  Please contact the on call pager after 5 pm and on weekends at [phone].

## 2020-11-23 NOTE — Discharge Instructions (Signed)

## 2020-11-23 NOTE — Plan of Care (Signed)
  Problem: Cardiac: Goal: Ability to achieve and maintain adequate cardiopulmonary perfusion will improve Outcome: Progressing   

## 2020-11-24 DIAGNOSIS — I130 Hypertensive heart and chronic kidney disease with heart failure and stage 1 through stage 4 chronic kidney disease, or unspecified chronic kidney disease: Secondary | ICD-10-CM | POA: Diagnosis not present

## 2020-11-24 DIAGNOSIS — E785 Hyperlipidemia, unspecified: Secondary | ICD-10-CM | POA: Diagnosis not present

## 2020-11-24 DIAGNOSIS — I5023 Acute on chronic systolic (congestive) heart failure: Secondary | ICD-10-CM | POA: Diagnosis not present

## 2020-11-24 DIAGNOSIS — I5021 Acute systolic (congestive) heart failure: Secondary | ICD-10-CM | POA: Diagnosis not present

## 2020-11-24 DIAGNOSIS — N184 Chronic kidney disease, stage 4 (severe): Secondary | ICD-10-CM | POA: Diagnosis not present

## 2020-11-24 LAB — CBC
HCT: 27.6 % — ABNORMAL LOW (ref 36.0–46.0)
Hemoglobin: 8.9 g/dL — ABNORMAL LOW (ref 12.0–15.0)
MCH: 28.8 pg (ref 26.0–34.0)
MCHC: 32.2 g/dL (ref 30.0–36.0)
MCV: 89.3 fL (ref 80.0–100.0)
Platelets: 131 10*3/uL — ABNORMAL LOW (ref 150–400)
RBC: 3.09 MIL/uL — ABNORMAL LOW (ref 3.87–5.11)
RDW: 16.8 % — ABNORMAL HIGH (ref 11.5–15.5)
WBC: 5.2 10*3/uL (ref 4.0–10.5)
nRBC: 0 % (ref 0.0–0.2)

## 2020-11-24 LAB — BASIC METABOLIC PANEL
Anion gap: 7 (ref 5–15)
BUN: 55 mg/dL — ABNORMAL HIGH (ref 8–23)
CO2: 25 mmol/L (ref 22–32)
Calcium: 8.6 mg/dL — ABNORMAL LOW (ref 8.9–10.3)
Chloride: 107 mmol/L (ref 98–111)
Creatinine, Ser: 3.36 mg/dL — ABNORMAL HIGH (ref 0.44–1.00)
GFR, Estimated: 14 mL/min — ABNORMAL LOW (ref >60)
Glucose, Bld: 101 mg/dL — ABNORMAL HIGH (ref 70–99)
Potassium: 4.8 mmol/L (ref 3.5–5.1)
Sodium: 139 mmol/L (ref 135–145)

## 2020-11-24 MED ORDER — FUROSEMIDE 80 MG PO TABS
80.0000 mg | ORAL_TABLET | Freq: Every day | ORAL | Status: DC
  Administered 2020-11-24 – 2020-11-26 (×2): 80 mg via ORAL
  Filled 2020-11-24 (×3): qty 1

## 2020-11-24 NOTE — Progress Notes (Signed)
HD#3 Subjective:  Overnight Events: None  Seen comfortably sleeping on rounds. No complaints this morning. States her left arm bones feel sore.  Objective:  Vital signs in last 24 hours: Vitals:   11/23/20 1621 11/23/20 1926 11/23/20 1955 11/24/20 0349  BP: 127/67 (!) 113/52 94/64 119/66  Pulse: 70 68 70 70  Resp: '16 16 18 18  '$ Temp: (!) 97.5 F (36.4 C) 97.8 F (36.6 C) (!) 97.4 F (36.3 C)   TempSrc: Oral Oral Oral   SpO2: 95% 100% 97% 99%  Weight:    54.4 kg   Supplemental O2: Room Air SpO2: 99 %  Physical Exam:  Physical Exam Constitutional:      General: She is not in acute distress.    Appearance: Normal appearance.  HENT:     Head: Normocephalic and atraumatic.     Right Ear: External ear normal.     Left Ear: External ear normal.     Nose: Nose normal.     Mouth/Throat:     Mouth: Mucous membranes are moist.     Pharynx: Oropharynx is clear.  Eyes:     Extraocular Movements: Extraocular movements intact.     Pupils: Pupils are equal, round, and reactive to light.  Cardiovascular:     Rate and Rhythm: Normal rate and regular rhythm.     Pulses: Normal pulses.     Comments: JVD improving Pulmonary:     Effort: Pulmonary effort is normal. No respiratory distress.     Breath sounds: Wheezing (mild) present.  Abdominal:     General: Abdomen is flat. There is no distension.     Tenderness: There is abdominal tenderness (mild lower abdominal ).  Musculoskeletal:     Right lower leg: Edema (2+ improved from yesterday) present.     Left lower leg: Edema (left BKA, similar edema) present.     Comments: LUE edema similar to yesterday  Skin:    General: Skin is warm and dry.     Capillary Refill: Capillary refill takes less than 2 seconds.  Neurological:     General: No focal deficit present.     Mental Status: She is alert and oriented to person, place, and time. Mental status is at baseline.    Filed Weights   11/22/20 0500 11/23/20 0349 11/24/20 0349   Weight: 64.5 kg 66.8 kg 54.4 kg     Intake/Output Summary (Last 24 hours) at 11/24/2020 [id number] Last data filed at 11/23/2020 1845 Gross per 24 hour  Intake 1413 ml  Output 206 ml  Net 1207 ml   Net IO Since Admission: -275 mL [11/24/20 0620]  Pertinent Labs: CBC Latest Ref Rng & Units 11/24/2020 11/23/2020 11/22/2020  WBC 4.0 - 10.5 K/uL 5.2 4.8 5.8  Hemoglobin 12.0 - 15.0 g/dL 8.9(L) 9.2(L) 9.3(L)  Hematocrit 36.0 - 46.0 % 27.6(L) 27.7(L) 29.1(L)  Platelets 150 - 400 K/uL 131(L) 138(L) 122(L)    CMP Latest Ref Rng & Units 11/24/2020 11/23/2020 11/22/2020  Glucose 70 - 99 mg/dL 101(H) 105(H) 92  BUN 8 - 23 mg/dL 55(H) 50(H) 50(H)  Creatinine 0.44 - 1.00 mg/dL 3.36(H) 2.97(H) 3.11(H)  Sodium 135 - 145 mmol/L 139 138 142  Potassium 3.5 - 5.1 mmol/L 4.8 4.7 5.1  Chloride 98 - 111 mmol/L 107 106 112(H)  CO2 22 - 32 mmol/L 25 23 17(L)  Calcium 8.9 - 10.3 mg/dL 8.6(L) 8.4(L) 8.7(L)  Total Protein 6.5 - 8.1 g/dL - - 5.8(L)  Total Bilirubin 0.3 - 1.2  mg/dL - - 1.6(H)  Alkaline Phos 38 - 126 U/L - - 89  AST 15 - 41 U/L - - 21  ALT 0 - 44 U/L - - 16    Imaging: ECHOCARDIOGRAM COMPLETE  1. Left ventricular ejection fraction, by estimation, is 30 to 35%. The  left ventricle has moderately decreased function. The left ventricle  demonstrates global hypokinesis. Left ventricular diastolic parameters are  consistent with Grade II diastolic  dysfunction (pseudonormalization).   2. Right ventricular systolic function is normal. The right ventricular  size is normal. There is moderately elevated pulmonary artery systolic  pressure. The estimated right ventricular systolic pressure is 99991111 mmHg.   3. Left atrial size was severely dilated.   4. Right atrial size was moderately dilated.   5. The mitral valve is normal in structure. Mild mitral valve  regurgitation. No evidence of mitral stenosis.   6. Tricuspid valve regurgitation is severe.   7. The aortic valve is normal in structure. Aortic  valve regurgitation is  not visualized. No aortic stenosis is present.   8. The inferior vena cava is normal in size with greater than 50%  respiratory variability, suggesting right atrial pressure of 3 mmHg.  VAS [place]  UPPER EXTREMITY VENOUS DUPLEX Left: No evidence of deep vein thrombosis in the upper extremity. No evidence of superficial vein thrombosis in the upper extremity.  *See table(s) above for measurements and observations.  Diagnosing physician: Jamelle Haring Electronically signed by Jamelle Haring on 11/22/2020 at 8:13:33 PM.    Final     Assessment/Plan:   Principal Problem:   Acute exacerbation of CHF (congestive heart failure) ([place]) Active Problems:   Complete heart block ([place])   CKD stage 4 secondary to hypertension ([place])   Diabetes mellitus ([place])   Paroxysmal atrial fibrillation [facility])  Patient Summary: [name] [name]. [name] is a 70 yo female with PMH of CAD w/sinus arrest now s/p pacemaker, HFrEF (EF 20%), paroxysmal atrial fibrillation on eliquis, chronic diarrhea, TIIDM s/p left BKA, PVD, hyperthyroidism, CKD Stage IV, and normocytic anemia presenting with increasing shortness of breath and weight gain since hospital discharge on 2/16 admitted for HFrEF exacerbation.   HFrEF Exacerbation CAD s/p pacemaker, Hx of sinus arrest Moderate Mitral Regurge, Severe Tricuspid Regurge Paroxysmal atrial fibrillation This morning states she feels well. Fluid in LE improving, JVD improving as well.. 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 Echo with improved EF to 30-35% otherwise similar to prior. She reports having significant urine output but diff to tract due to some diarrhea last night. Overall appears improving will transition to home lasix -  PO lasix to 80 mg daily - cont. Coreg 12.5 mg bid, hold entresto and statin - cont. xarelto - strict I/Os, daily weights  - monitor electrolytes    AKI on CKD  Stage IV Likely due to ADHF, Cr up to 3.3 this morning.  - cont. Na bicarb  - hold entresto and statin - monitor BMP  Diabetes type II On home Jardiance. Refusing SSI. CBGs in low 100s will continue to monitor.  - CBG monitor   Hypertension HLD BP remains well controlled off medications. - hold valsartan to irbesartan  - hold  bidil and lipitor  Hyperthyroidism Diagnosed during last admission 2/13-2/16. Radioiodine study was ordered but she has not yet been able to complete this.  - TSH of 0.01, T4 elevated but improved 2.5>1.3 - Outpatient raiodiodine uptake study, repeat TSH and  T4    Diet: HH/CM VTE: Xarelto IVF: none Code: full   Dispo: Anticipated discharge to Home in 1 days.  [name], MD 11/24/2020, 6:20 AM Pager: [phone]  Please contact the on call pager after 5 pm and on weekends at [phone].

## 2020-11-24 NOTE — Plan of Care (Signed)
  Problem: Cardiac: Goal: Ability to achieve and maintain adequate cardiopulmonary perfusion will improve Outcome: Progressing   

## 2020-11-25 DIAGNOSIS — I5023 Acute on chronic systolic (congestive) heart failure: Secondary | ICD-10-CM | POA: Diagnosis not present

## 2020-11-25 DIAGNOSIS — I130 Hypertensive heart and chronic kidney disease with heart failure and stage 1 through stage 4 chronic kidney disease, or unspecified chronic kidney disease: Secondary | ICD-10-CM | POA: Diagnosis not present

## 2020-11-25 LAB — BASIC METABOLIC PANEL
Anion gap: 9 (ref 5–15)
BUN: 58 mg/dL — ABNORMAL HIGH (ref 8–23)
CO2: 26 mmol/L (ref 22–32)
Calcium: 8.6 mg/dL — ABNORMAL LOW (ref 8.9–10.3)
Chloride: 106 mmol/L (ref 98–111)
Creatinine, Ser: 3.32 mg/dL — ABNORMAL HIGH (ref 0.44–1.00)
GFR, Estimated: 15 mL/min — ABNORMAL LOW (ref >60)
Glucose, Bld: 100 mg/dL — ABNORMAL HIGH (ref 70–99)
Potassium: 4.7 mmol/L (ref 3.5–5.1)
Sodium: 141 mmol/L (ref 135–145)

## 2020-11-25 LAB — CBC
HCT: 27.9 % — ABNORMAL LOW (ref 36.0–46.0)
Hemoglobin: 8.8 g/dL — ABNORMAL LOW (ref 12.0–15.0)
MCH: 28.6 pg (ref 26.0–34.0)
MCHC: 31.5 g/dL (ref 30.0–36.0)
MCV: 90.6 fL (ref 80.0–100.0)
Platelets: 117 10*3/uL — ABNORMAL LOW (ref 150–400)
RBC: 3.08 MIL/uL — ABNORMAL LOW (ref 3.87–5.11)
RDW: 16.7 % — ABNORMAL HIGH (ref 11.5–15.5)
WBC: 4.6 10*3/uL (ref 4.0–10.5)
nRBC: 0 % (ref 0.0–0.2)

## 2020-11-25 MED ORDER — FUROSEMIDE 10 MG/ML IJ SOLN
80.0000 mg | Freq: Once | INTRAMUSCULAR | Status: AC
  Administered 2020-11-25: 80 mg via INTRAVENOUS
  Filled 2020-11-25: qty 8

## 2020-11-25 MED ORDER — FUROSEMIDE 10 MG/ML IJ SOLN: 40.0000 mg | Freq: Once | INTRAMUSCULAR | Status: DC

## 2020-11-25 NOTE — Progress Notes (Signed)
Subjective:   Patient is seen at bedside, States that she is still short of breath. States that she urinated a lot with oral furosemide. States that she can't put in prosthetic because of her LE edema. She typically uses a wheelchair for ambulation at home. States that her stomach is swollen.   Objective:  Vital signs in last 24 hours: Vitals:   11/24/20 1157 11/24/20 1559 11/24/20 1945 11/25/20 0422  BP: 133/60 102/65 (!) 111/53 (!) 130/42  Pulse: 70 70 69 68  Resp: '16  18 19  '$ Temp: (!) 97.4 F (36.3 C) (!) 97.5 F (36.4 C) (!) 97.4 F (36.3 C) 98.4 F (36.9 C)  TempSrc: Oral Oral Oral Oral  SpO2: 100% 100% 100% 100%  Weight:    57.3 kg   Physical Exam Vitals reviewed.  Constitutional:      General: She is not in acute distress.    Appearance: Normal appearance. She is not ill-appearing.  HENT:     Head: Normocephalic and atraumatic.  Cardiovascular:     Rate and Rhythm: Normal rate and regular rhythm.     Heart sounds: Normal heart sounds. No murmur heard. No friction rub. No gallop.   Pulmonary:     Effort: Pulmonary effort is normal. No respiratory distress.     Breath sounds: Normal breath sounds. No wheezing or rales.  Abdominal:     General: Abdomen is flat. Bowel sounds are normal. There is no distension.     Palpations: Abdomen is soft.     Tenderness: There is no abdominal tenderness. There is no guarding.  Musculoskeletal:        General: No swelling or tenderness.     Right lower leg: Edema present.     Left lower leg: Edema present.  Skin:    General: Skin is warm and dry.  Neurological:     Mental Status: She is alert and oriented to person, place, and time.  Psychiatric:        Mood and Affect: Mood normal.        Behavior: Behavior normal.        Thought Content: Thought content normal.        Judgment: Judgment normal.     Assessment/Plan:  Principal Problem:   Acute exacerbation of CHF (congestive heart failure) (H[facility]) Active Problems:    Complete heart block (H[facility])   CKD stage 4 secondary to hypertension (H[facility])   Diabetes mellitus (H[facility])   Paroxysmal atrial fibrillation ([place])  [name] [name] is a 70 yo female with PMH of CAD w/sinus arrest now s/p pacemaker, HFrEF (EF 20%), paroxysmal atrial fibrillation on eliquis, chronic diarrhea, TIIDM s/p left BKA, PVD, hyperthyroidism, CKD Stage IV, and normocytic anemia presenting with increasing shortness of breath and weight gain since hospital discharge on 2/16 admitted for HFrEF exacerbation.  HFrEF Exacerbation CAD s/p pacemaker, Hx of sinus arrest Moderate Mitral Regurge, Severe Tricuspid Regurge Paroxysmal atrial fibrillation Chronic HF thought to becardiomyopathysecondary to RV pacing will need outpatient evalaution for replacement pacemaker. Echo with improved EF to 30-35% otherwise similar to prior. States her breathing feels worse this morning; saturating well on RA. Also, feels her abdomen is more distended. LE edema improving. Will continue IV diuresis this morning.  -  IV furosemide 80 mg - cont. Coreg 12.5 mg bid, hold entresto and statin - cont. xarelto - strict I/Os, daily weights  - monitor electrolytes   AKI on CKD Stage IV Likely due to ADHF, Cr 3.3, from 3.2  yesterday.  - cont. Na bicarb - hold entresto and statin - monitor BMP  Diabetes type II On home Jardiance. Refusing SSI. CBGs in low 100s will continue to monitor.  - CBG monitor   Hypertension HLD BP remains well controlled off medications. - hold valsartan to irbesartan  - hold  bidil and lipitor  Hyperthyroidism Diagnosed during last admission 2/13-2/16. Radioiodine study was ordered but she has not yet been able to complete this.  - TSH of 0.01, T4 elevated but improved 2.5>1.3 - Outpatient raiodiodine uptake study, repeat TSH and T4   Prior to Admission Living Arrangement: Home Anticipated Discharge Location: Home Barriers to Discharge: Clinical improvement  Dispo: Anticipated  discharge in approximately 0-1 day(s).   Mike Craze, DO 11/25/2020, 6:57 AM Pager: [phone] After 5pm on weekdays and 1pm on weekends: On Call pager [phone]

## 2020-11-26 ENCOUNTER — Inpatient Hospital Stay (HOSPITAL_COMMUNITY): Payer: Medicare Other

## 2020-11-26 DIAGNOSIS — N184 Chronic kidney disease, stage 4 (severe): Secondary | ICD-10-CM | POA: Diagnosis not present

## 2020-11-26 DIAGNOSIS — I5023 Acute on chronic systolic (congestive) heart failure: Secondary | ICD-10-CM | POA: Diagnosis not present

## 2020-11-26 DIAGNOSIS — E785 Hyperlipidemia, unspecified: Secondary | ICD-10-CM | POA: Diagnosis not present

## 2020-11-26 DIAGNOSIS — I5021 Acute systolic (congestive) heart failure: Secondary | ICD-10-CM | POA: Diagnosis not present

## 2020-11-26 DIAGNOSIS — I130 Hypertensive heart and chronic kidney disease with heart failure and stage 1 through stage 4 chronic kidney disease, or unspecified chronic kidney disease: Secondary | ICD-10-CM | POA: Diagnosis not present

## 2020-11-26 LAB — GLUCOSE, CAPILLARY
Glucose-Capillary: 97 mg/dL (ref 70–99)
Glucose-Capillary: 112 mg/dL — ABNORMAL HIGH (ref 70–99)
Glucose-Capillary: 171 mg/dL — ABNORMAL HIGH (ref 70–99)
Glucose-Capillary: 112 mg/dL — ABNORMAL HIGH (ref 70–99)
Glucose-Capillary: 144 mg/dL — ABNORMAL HIGH (ref 70–99)
Glucose-Capillary: 99 mg/dL (ref 70–99)
Glucose-Capillary: 89 mg/dL (ref 70–99)
Glucose-Capillary: 110 mg/dL — ABNORMAL HIGH (ref 70–99)
Glucose-Capillary: 173 mg/dL — ABNORMAL HIGH (ref 70–99)
Glucose-Capillary: 82 mg/dL (ref 70–99)
Glucose-Capillary: 112 mg/dL — ABNORMAL HIGH (ref 70–99)

## 2020-11-26 LAB — BASIC METABOLIC PANEL
Anion gap: 9 (ref 5–15)
BUN: 62 mg/dL — ABNORMAL HIGH (ref 8–23)
CO2: 26 mmol/L (ref 22–32)
Calcium: 8.7 mg/dL — ABNORMAL LOW (ref 8.9–10.3)
Chloride: 103 mmol/L (ref 98–111)
Creatinine, Ser: 3.12 mg/dL — ABNORMAL HIGH (ref 0.44–1.00)
GFR, Estimated: 16 mL/min — ABNORMAL LOW (ref >60)
Glucose, Bld: 119 mg/dL — ABNORMAL HIGH (ref 70–99)
Potassium: 4.4 mmol/L (ref 3.5–5.1)
Sodium: 138 mmol/L (ref 135–145)

## 2020-11-26 MED ORDER — ALBUTEROL SULFATE (2.5 MG/3ML) 0.083% IN NEBU
2.5000 mg | INHALATION_SOLUTION | Freq: Two times a day (BID) | RESPIRATORY_TRACT | Status: DC
  Administered 2020-11-27 – 2020-12-01 (×7): 2.5 mg via RESPIRATORY_TRACT
  Filled 2020-11-26 (×11): qty 3

## 2020-11-26 MED ORDER — ALBUTEROL SULFATE (2.5 MG/3ML) 0.083% IN NEBU
2.5000 mg | INHALATION_SOLUTION | Freq: Three times a day (TID) | RESPIRATORY_TRACT | Status: DC
  Administered 2020-11-26: 2.5 mg via RESPIRATORY_TRACT
  Filled 2020-11-26: qty 3

## 2020-11-26 MED ORDER — FUROSEMIDE 10 MG/ML IJ SOLN
80.0000 mg | Freq: Once | INTRAMUSCULAR | Status: AC
  Administered 2020-11-26: 80 mg via INTRAVENOUS
  Filled 2020-11-26: qty 8

## 2020-11-26 NOTE — Care Management Important Message (Signed)
Important Message  Patient Details  Name: [name] [name] MRN: [id number] Date of Birth: 1951/08/02   Medicare Important Message Given:  Yes     Shelda Altes 11/26/2020, 9:38 AM

## 2020-11-26 NOTE — Progress Notes (Addendum)
Subjective:  Denies pain and breathing improved since yesterday. She asked about her albuterol inhaler and we discussed albuterol is order PRN, will schedule this for her.   Objective:  Vital signs in last 24 hours: Vitals:   11/25/20 0422 11/25/20 1211 11/25/20 2009 11/26/20 0341  BP: (!) 130/42 (!) 136/58 132/64 (!) 125/51  Pulse: 68 71 70 69  Resp: '19 18  16  '$ Temp: 98.4 F (36.9 C) 98.2 F (36.8 C) 98.3 F (36.8 C) 97.7 F (36.5 C)  TempSrc: Oral  Axillary Oral  SpO2: 100% 100% 100% 94%  Weight: 57.3 kg   65.1 kg   Physical Exam Vitals reviewed.  Constitutional:      General: She is not in acute distress.    Appearance: Normal appearance. She is not ill-appearing.  HENT:     Head: Normocephalic and atraumatic.  Neck:     Comments: JVD distended to mid neck Cardiovascular:     Rate and Rhythm: Normal rate and regular rhythm.     Heart sounds: Normal heart sounds. No murmur heard. No friction rub. No gallop.   Pulmonary:     Effort: Pulmonary effort is normal. No respiratory distress.     Breath sounds: Wheezing (mild bilateral) present. No rales.  Abdominal:     General: Bowel sounds are normal. There is distension (tympanic throughout).     Palpations: There is no mass.     Tenderness: There is abdominal tenderness (diffuse, mild). There is no guarding.     Hernia: No hernia is present.  Musculoskeletal:     Right lower leg: Edema present.     Left lower leg: Edema present.     Comments: Greater edema of the LLE stump, left BKA  Skin:    General: Skin is warm and dry.  Neurological:     Mental Status: She is alert and oriented to person, place, and time.  Psychiatric:        Mood and Affect: Mood normal.        Behavior: Behavior normal.        Thought Content: Thought content normal.        Judgment: Judgment normal.    BMP Latest Ref Rng & Units 11/26/2020 11/25/2020 11/24/2020  Glucose 70 - 99 mg/dL 119(H) 100(H) 101(H)  BUN 8 - 23 mg/dL 62(H) 58(H)  55(H)  Creatinine 0.44 - 1.00 mg/dL 3.12(H) 3.32(H) 3.36(H)  BUN/Creat Ratio 12 - 28 - - -  Sodium 135 - 145 mmol/L 138 141 139  Potassium 3.5 - 5.1 mmol/L 4.4 4.7 4.8  Chloride 98 - 111 mmol/L 103 106 107  CO2 22 - 32 mmol/L '26 26 25  '$ Calcium 8.9 - 10.3 mg/dL 8.7(L) 8.6(L) 8.6(L)    Assessment/Plan:  Principal Problem:   Acute exacerbation of CHF (congestive heart failure) ([facility]) Active Problems:   Complete heart block ([facility])   CKD stage 4 secondary to hypertension ([facility])   Diabetes mellitus ([facility])   Paroxysmal atrial fibrillation ([place])  [name] [name] is a 70 yo female with PMH of CAD w/sinus arrest now s/p pacemaker, HFrEF (EF 20%), paroxysmal atrial fibrillation on eliquis, chronic diarrhea, TIIDM s/p left BKA, PVD, hyperthyroidism, CKD Stage IV, and normocytic anemia presenting with increasing shortness of breath and weight gain since hospital discharge on 2/16 admitted for HFrEF exacerbation.   HFrEF Exacerbation CAD s/p pacemaker, Hx of sinus arrest Moderate Mitral Regurgitation, Severe Tricuspid Regurgitation Paroxysmal atrial fibrillation Chronic HF thought to be cardiomyopathy secondary to RV  pacing will need outpatient evalaution for replacement pacemaker. Echo with improved EF to 30-35% otherwise similar to prior. States breathing is better but would like inhaler due to wheezing. 3.9 L Urine output overnight. Abdomen more distended and diffusely tender. LE edema of the right leg improving.  - Oral furosemide 80 mg, monitor urine output may need additional diuresis in afternoon - Abdominal xray, bedside abdominal US - cont. Coreg 12.5 mg bid, hold entresto and statin - cont. xarelto - strict I/Os, daily weights  - monitor electrolytes   AKI on CKD Stage IV Likely due to ADHF, improved to Cr 3.12, from 3.3 yesterday. Given recent worsening of renal function may benefit from switching from xarelto to [place] if kidney function does not improve.  - cont. Na bicarb  - hold  entresto and statin - monitor BMP   Diabetes type II On home Jardiance. CBGs in low 100s will continue to monitor.  - CBG monitor   Hypertension HLD BP remains well controlled off medications. - hold ARB - hold  bidil and lipitor   Hyperthyroidism Diagnosed during last admission 2/13-2/16. Radioiodine study was ordered but she has not yet been able to complete this.  - TSH of 0.01, T4 elevated but improved 2.5>1.3 - Outpatient raiodiodine uptake study, repeat TSH and T4   Prior to Admission Living Arrangement: Home Anticipated Discharge Location: Home Barriers to Discharge: Clinical improvement  Dispo: Anticipated discharge in approximately 1-2 day(s).   [name], MD 11/26/2020, 7:24 AM Pager: [phone] After 5pm on weekdays and 1pm on weekends: On Call pager [phone]

## 2020-11-27 DIAGNOSIS — N049 Nephrotic syndrome with unspecified morphologic changes: Secondary | ICD-10-CM | POA: Diagnosis not present

## 2020-11-27 DIAGNOSIS — I5082 Biventricular heart failure: Secondary | ICD-10-CM | POA: Diagnosis not present

## 2020-11-27 DIAGNOSIS — I455 Other specified heart block: Secondary | ICD-10-CM | POA: Diagnosis not present

## 2020-11-27 DIAGNOSIS — N179 Acute kidney failure, unspecified: Secondary | ICD-10-CM | POA: Diagnosis not present

## 2020-11-27 DIAGNOSIS — E0590 Thyrotoxicosis, unspecified without thyrotoxic crisis or storm: Secondary | ICD-10-CM | POA: Diagnosis not present

## 2020-11-27 DIAGNOSIS — E871 Hypo-osmolality and hyponatremia: Secondary | ICD-10-CM | POA: Diagnosis not present

## 2020-11-27 DIAGNOSIS — E875 Hyperkalemia: Secondary | ICD-10-CM | POA: Diagnosis not present

## 2020-11-27 DIAGNOSIS — D631 Anemia in chronic kidney disease: Secondary | ICD-10-CM | POA: Diagnosis not present

## 2020-11-27 DIAGNOSIS — R2689 Other abnormalities of gait and mobility: Secondary | ICD-10-CM | POA: Diagnosis not present

## 2020-11-27 DIAGNOSIS — R570 Cardiogenic shock: Secondary | ICD-10-CM | POA: Diagnosis not present

## 2020-11-27 DIAGNOSIS — I5023 Acute on chronic systolic (congestive) heart failure: Secondary | ICD-10-CM | POA: Diagnosis present

## 2020-11-27 DIAGNOSIS — I5043 Acute on chronic combined systolic (congestive) and diastolic (congestive) heart failure: Secondary | ICD-10-CM | POA: Diagnosis not present

## 2020-11-27 DIAGNOSIS — I130 Hypertensive heart and chronic kidney disease with heart failure and stage 1 through stage 4 chronic kidney disease, or unspecified chronic kidney disease: Secondary | ICD-10-CM | POA: Diagnosis not present

## 2020-11-27 DIAGNOSIS — N184 Chronic kidney disease, stage 4 (severe): Secondary | ICD-10-CM | POA: Diagnosis not present

## 2020-11-27 DIAGNOSIS — E43 Unspecified severe protein-calorie malnutrition: Secondary | ICD-10-CM | POA: Diagnosis not present

## 2020-11-27 DIAGNOSIS — I480 Paroxysmal atrial fibrillation: Secondary | ICD-10-CM | POA: Diagnosis not present

## 2020-11-27 DIAGNOSIS — K921 Melena: Secondary | ICD-10-CM | POA: Diagnosis not present

## 2020-11-27 DIAGNOSIS — I2510 Atherosclerotic heart disease of native coronary artery without angina pectoris: Secondary | ICD-10-CM | POA: Diagnosis not present

## 2020-11-27 DIAGNOSIS — D500 Iron deficiency anemia secondary to blood loss (chronic): Secondary | ICD-10-CM | POA: Diagnosis not present

## 2020-11-27 DIAGNOSIS — Z66 Do not resuscitate: Secondary | ICD-10-CM | POA: Diagnosis not present

## 2020-11-27 DIAGNOSIS — I442 Atrioventricular block, complete: Secondary | ICD-10-CM | POA: Diagnosis not present

## 2020-11-27 DIAGNOSIS — E1122 Type 2 diabetes mellitus with diabetic chronic kidney disease: Secondary | ICD-10-CM | POA: Diagnosis not present

## 2020-11-27 DIAGNOSIS — Z950 Presence of cardiac pacemaker: Secondary | ICD-10-CM | POA: Diagnosis not present

## 2020-11-27 DIAGNOSIS — I361 Nonrheumatic tricuspid (valve) insufficiency: Secondary | ICD-10-CM

## 2020-11-27 DIAGNOSIS — I340 Nonrheumatic mitral (valve) insufficiency: Secondary | ICD-10-CM

## 2020-11-27 LAB — GASTROINTESTINAL PANEL BY PCR, STOOL (REPLACES STOOL CULTURE)

## 2020-11-27 LAB — BASIC METABOLIC PANEL
Anion gap: 11 (ref 5–15)
BUN: 66 mg/dL — ABNORMAL HIGH (ref 8–23)
CO2: 25 mmol/L (ref 22–32)
Calcium: 8.8 mg/dL — ABNORMAL LOW (ref 8.9–10.3)
Chloride: 99 mmol/L (ref 98–111)
Creatinine, Ser: 3.09 mg/dL — ABNORMAL HIGH (ref 0.44–1.00)
GFR, Estimated: 16 mL/min — ABNORMAL LOW (ref >60)
Glucose, Bld: 75 mg/dL (ref 70–99)
Potassium: 4.8 mmol/L (ref 3.5–5.1)
Sodium: 135 mmol/L (ref 135–145)

## 2020-11-27 LAB — GLUCOSE, CAPILLARY
Glucose-Capillary: 129 mg/dL — ABNORMAL HIGH (ref 70–99)
Glucose-Capillary: 133 mg/dL — ABNORMAL HIGH (ref 70–99)
Glucose-Capillary: 98 mg/dL (ref 70–99)
Glucose-Capillary: 117 mg/dL — ABNORMAL HIGH (ref 70–99)

## 2020-11-27 LAB — CBC
HCT: 26.2 % — ABNORMAL LOW (ref 36.0–46.0)
Hemoglobin: 8.5 g/dL — ABNORMAL LOW (ref 12.0–15.0)
MCH: 28.8 pg (ref 26.0–34.0)
MCHC: 32.4 g/dL (ref 30.0–36.0)
MCV: 88.8 fL (ref 80.0–100.0)
Platelets: 84 10*3/uL — ABNORMAL LOW (ref 150–400)
RBC: 2.95 MIL/uL — ABNORMAL LOW (ref 3.87–5.11)
RDW: 16.5 % — ABNORMAL HIGH (ref 11.5–15.5)
WBC: 4.9 10*3/uL (ref 4.0–10.5)
nRBC: 0 % (ref 0.0–0.2)

## 2020-11-27 LAB — MAGNESIUM: Magnesium: 1.6 mg/dL — ABNORMAL LOW (ref 1.7–2.4)

## 2020-11-27 MED ORDER — MAGNESIUM SULFATE 2 GM/50ML IV SOLN
2.0000 g | Freq: Once | INTRAVENOUS | Status: AC
  Administered 2020-11-27: 2 g via INTRAVENOUS
  Filled 2020-11-27: qty 50

## 2020-11-27 MED ORDER — FUROSEMIDE 80 MG PO TABS
80.0000 mg | ORAL_TABLET | Freq: Two times a day (BID) | ORAL | Status: DC
  Administered 2020-11-27: 80 mg via ORAL

## 2020-11-27 MED ORDER — FUROSEMIDE 10 MG/ML IJ SOLN
80.0000 mg | Freq: Two times a day (BID) | INTRAMUSCULAR | Status: DC
  Administered 2020-11-27: 80 mg via INTRAVENOUS
  Filled 2020-11-27 (×2): qty 8

## 2020-11-27 NOTE — Progress Notes (Signed)
Subjective:  No acute overnight events.  Still complaining of abdominal pain and swelling primarily on the left side along with left arm swelling. She does report that she was able to pass two bowel movements this morning with only minimal relief of pain.   Denies any shortness of breath at rest.  Objective:  Vital signs in last 24 hours: Vitals:   11/26/20 2000 11/26/20 2128 11/27/20 0404 11/27/20 0839  BP:  125/60 (!) 133/56   Pulse:  70 70 70  Resp:  '18 16 18  '$ Temp:  (!) 97.5 F (36.4 C) 97.8 F (36.6 C)   TempSrc:  Oral Oral   SpO2: 100% 100% 100% 100%  Weight:   65.5 kg     Intake/Output Summary (Last 24 hours) at 11/27/2020 1344 Last data filed at 11/27/2020 1157 Gross per 24 hour  Intake 600 ml  Output 1251 ml  Net -651 ml     Physical Exam Constitutional:      Appearance: Normal appearance. She is not ill-appearing.  Cardiovascular:     Pulses: Normal pulses.     Heart sounds: Normal heart sounds.     Comments: Ventricle-paced rate and rhythm. Elevated JVP. Pulmonary:     Effort: Pulmonary effort is normal.     Breath sounds: Normal breath sounds. No wheezing, rhonchi or rales.  Abdominal:     Comments: 1-2+ pitting edema throughout abdomen, greater on left side. Mild diffuse tenderness and mild distention.  Musculoskeletal:        General: Normal range of motion.     Cervical back: Normal range of motion.     Comments: 1-2+ pitting edema in bilateral lower extremities. 1-2+ pitting sacral edema on left side.  Skin:    General: Skin is warm and dry.  Neurological:     General: No focal deficit present.     Mental Status: She is alert and oriented to person, place, and time.  Psychiatric:        Mood and Affect: Mood normal.        Behavior: Behavior normal.      Assessment/Plan:  Principal Problem:   Acute exacerbation of CHF (congestive heart failure) (HCC) Active Problems:   Complete heart block (HCC)   CKD stage 4 secondary to hypertension  ([place])   Diabetes mellitus (HCC)   Paroxysmal atrial fibrillation ([facility])  [name] [name] is a 70 year old female with PMH of CAD w/sinus arrest now s/p pacemaker, HFrEF (EF 30-35%), paroxysmal atrial fibrillation on eliquis, T2DM s/p left BKA, PVD, hyperthyroidism, CKD stage IV, and normocytic anemia presenting with increasing SHOB and weight gain, admitted for HFrEF exacerbation.  HFrEF Exacerbation CAD, Hx of sinus arrest s/p pacemaker Moderate MR, Severe TR Paroxysmal Atrial Fibrillation Patient is still significantly volume overloaded on exam, so will need to continue with diuresis. She does report improvement in [facility], although she has not been able to move around much as of yet so will have PT and OT work with her. Patient still having abdominal distention and tenderness. Bedside U/S yesterday unrevealing and GI path panel negative. This is likely from being volume overloaded as she has significant pitting edema throughout abdomen, which should improve with diuresis. -increased PO lasix dose to '80mg'$  BID -if not producing significant UOP, will need to increase lasix dose -PT/OT -monitor UOP, strict I/O's, daily weights -continue coreg 12.'5mg'$  BID, holding entresto -consider switching xarelto to apixaban should kidney function continue to worsen  AKI on CKD Stage IV Creatinine likely  not a good marker to assess kidney function as patient has CKD Stage IV. GFR on admission was 18, today it is 16 so not an extremely significant change. -continue sodium bicarb until discharge -monitor BMP  Hypomagnesemia Mg 1.6, repleted with 2g mag sulfate.  Prior to Admission Living Arrangement: Home Anticipated Discharge Location: TBD Barriers to Discharge: continued medical management Dispo: Anticipated discharge in approximately 2-3 day(s).   Virl Axe, MD 11/27/2020, 10:11 AM Pager: [phone] After 5pm on weekdays and 1pm on weekends: On Call pager [phone]

## 2020-11-27 NOTE — Plan of Care (Signed)
°  Problem: Coping: °Goal: Level of anxiety will decrease °Outcome: Progressing °  °

## 2020-11-27 NOTE — Evaluation (Signed)
Physical Therapy Evaluation Patient Details Name: [name] [name] MRN: [id number] DOB: Oct 23, 1950 Today's Date: 11/27/2020   History of Present Illness  Pt is a 70 y/o female admitted secondary to CHF exacerbation. PMH includes s/p L BKA, CKD, DM, a fib, HTN, CHF, CAD, and s/p pacemaker.  Clinical Impression  Pt admitted secondary to problem above with deficits below. Pt using WC at baseline and only performs transfers. Requiring min guard to transfer from [facility] to bed. No overt LOB noted. Pt reports she has an aide that comes 7 days/week for 3 hours. Recommending HHPT at d/c to ensure safety at home. Will continue to follow acutely.     Follow Up Recommendations Home health PT    Equipment Recommendations  None recommended by PT    Recommendations for Other Services       Precautions / Restrictions Precautions Precautions: Fall Precaution Comments: L BKA at baseline. Restrictions Weight Bearing Restrictions: No      Mobility  Bed Mobility               General bed mobility comments: Sitting on BSC upon entry    Transfers Overall transfer level: Needs assistance Equipment used: None Transfers: Sit to/from Stand;Stand Pivot Transfers Sit to Stand: Min guard Stand pivot transfers: Min guard       General transfer comment: Min guard for safety. Grabbing to bed rail for safety to transfer back to bed. No LOB noted.  Ambulation/Gait                Stairs            Wheelchair Mobility    Modified Rankin (Stroke Patients Only)       Balance Overall balance assessment: Needs assistance Sitting-balance support: No upper extremity supported;Feet supported Sitting balance-Leahy Scale: Good     Standing balance support: Bilateral upper extremity supported Standing balance-Leahy Scale: Poor Standing balance comment: Reliant on BUE support                             Pertinent Vitals/Pain Pain Assessment: No/denies pain    Home  Living Family/patient expects to be discharged to:: Private residence Living Arrangements: Alone Available Help at Discharge: Personal care attendant;Available PRN/intermittently Type of Home: Apartment Home Access: Level entry     Home Layout: One level Home Equipment: Shower seat;Wheelchair - manual Additional Comments: Has aide that comes 7 days/week for 3 hours    Prior Function Level of Independence: Needs assistance   Gait / Transfers Assistance Needed: Reports independence with transfers to [facility].  ADL's / Homemaking Assistance Needed: Reports aide assists with ADL tasks        Hand Dominance        Extremity/Trunk Assessment   Upper Extremity Assessment Upper Extremity Assessment: Defer to OT evaluation    Lower Extremity Assessment Lower Extremity Assessment: LLE deficits/detail LLE Deficits / Details: L BKA at baseline. Pt unable to achieve full knee extension (lacking ~10-15 deg.)    Cervical / Trunk Assessment Cervical / Trunk Assessment: Normal  Communication   Communication: No difficulties  Cognition Arousal/Alertness: Awake/alert Behavior During Therapy: WFL for tasks assessed/performed Overall Cognitive Status: No family/caregiver present to determine baseline cognitive functioning                                 General Comments: Likely close to baseline  General Comments      Exercises     Assessment/Plan    PT Assessment Patient needs continued PT services  PT Problem List Decreased strength;Decreased balance;Decreased mobility;Decreased activity tolerance       PT Treatment Interventions DME instruction;Functional mobility training;Therapeutic activities;Balance training;Therapeutic exercise;Patient/family education;Wheelchair mobility training    PT Goals (Current goals can be found in the Care Plan section)  Acute Rehab PT Goals Patient Stated Goal: to go home PT Goal Formulation: With patient Time For Goal  Achievement: 12/11/20 Potential to Achieve Goals: Good    Frequency Min 3X/week   Barriers to discharge Decreased caregiver support      Co-evaluation               AM-PAC PT "6 Clicks" Mobility  Outcome Measure Help needed turning from your back to your side while in a flat bed without using bedrails?: None Help needed moving from lying on your back to sitting on the side of a flat bed without using bedrails?: None Help needed moving to and from a bed to a chair (including a wheelchair)?: A Little Help needed standing up from a chair using your arms (e.g., wheelchair or bedside chair)?: A Little Help needed to walk in hospital room?: A Lot Help needed climbing 3-5 steps with a railing? : Total 6 Click Score: 17    End of Session   Activity Tolerance: Patient tolerated treatment well Patient left: in bed;with call bell/phone within reach;with bed alarm set (sitting EOB) Nurse Communication: Mobility status;Other (comment) (Pt requesting nausea meds) PT Visit Diagnosis: Unsteadiness on feet (R26.81);Muscle weakness (generalized) (M62.81)    Time: [id number] PT Time Calculation (min) (ACUTE ONLY): 18 min   Charges:   PT Evaluation $PT Eval Low Complexity: 1 Low          Lou Miner, DPT  Acute Rehabilitation Services  Pager: [phone] Office: [phone]   Rudean Hitt 11/27/2020, 3:35 PM

## 2020-11-28 DIAGNOSIS — E875 Hyperkalemia: Secondary | ICD-10-CM

## 2020-11-28 DIAGNOSIS — I5023 Acute on chronic systolic (congestive) heart failure: Secondary | ICD-10-CM | POA: Diagnosis not present

## 2020-11-28 DIAGNOSIS — N184 Chronic kidney disease, stage 4 (severe): Secondary | ICD-10-CM | POA: Diagnosis not present

## 2020-11-28 DIAGNOSIS — N179 Acute kidney failure, unspecified: Secondary | ICD-10-CM | POA: Diagnosis not present

## 2020-11-28 DIAGNOSIS — D631 Anemia in chronic kidney disease: Secondary | ICD-10-CM

## 2020-11-28 DIAGNOSIS — I130 Hypertensive heart and chronic kidney disease with heart failure and stage 1 through stage 4 chronic kidney disease, or unspecified chronic kidney disease: Secondary | ICD-10-CM | POA: Diagnosis not present

## 2020-11-28 LAB — CBC WITH DIFFERENTIAL/PLATELET
Abs Immature Granulocytes: 0.02 10*3/uL (ref 0.00–0.07)
Basophils Absolute: 0 10*3/uL (ref 0.0–0.1)
Basophils Relative: 0 %
Eosinophils Absolute: 0.3 10*3/uL (ref 0.0–0.5)
Eosinophils Relative: 6 %
HCT: 24.7 % — ABNORMAL LOW (ref 36.0–46.0)
Hemoglobin: 8.2 g/dL — ABNORMAL LOW (ref 12.0–15.0)
Immature Granulocytes: 0 %
Lymphocytes Relative: 23 %
Lymphs Abs: 1.2 10*3/uL (ref 0.7–4.0)
MCH: 29.6 pg (ref 26.0–34.0)
MCHC: 33.2 g/dL (ref 30.0–36.0)
MCV: 89.2 fL (ref 80.0–100.0)
Monocytes Absolute: 0.8 10*3/uL (ref 0.1–1.0)
Monocytes Relative: 16 %
Neutro Abs: 2.8 10*3/uL (ref 1.7–7.7)
Neutrophils Relative %: 55 %
Platelets: 100 10*3/uL — ABNORMAL LOW (ref 150–400)
RBC: 2.77 MIL/uL — ABNORMAL LOW (ref 3.87–5.11)
RDW: 16.6 % — ABNORMAL HIGH (ref 11.5–15.5)
WBC: 5.1 10*3/uL (ref 4.0–10.5)
nRBC: 0 % (ref 0.0–0.2)

## 2020-11-28 LAB — BASIC METABOLIC PANEL
Anion gap: 11 (ref 5–15)
Anion gap: 11 (ref 5–15)
BUN: 75 mg/dL — ABNORMAL HIGH (ref 8–23)
BUN: 72 mg/dL — ABNORMAL HIGH (ref 8–23)
CO2: 25 mmol/L (ref 22–32)
CO2: 26 mmol/L (ref 22–32)
Calcium: 8.9 mg/dL (ref 8.9–10.3)
Calcium: 8.9 mg/dL (ref 8.9–10.3)
Chloride: 98 mmol/L (ref 98–111)
Chloride: 96 mmol/L — ABNORMAL LOW (ref 98–111)
Creatinine, Ser: 3.3 mg/dL — ABNORMAL HIGH (ref 0.44–1.00)
Creatinine, Ser: 3.33 mg/dL — ABNORMAL HIGH (ref 0.44–1.00)
GFR, Estimated: 15 mL/min — ABNORMAL LOW (ref >60)
GFR, Estimated: 15 mL/min — ABNORMAL LOW (ref >60)
Glucose, Bld: 73 mg/dL (ref 70–99)
Glucose, Bld: 103 mg/dL — ABNORMAL HIGH (ref 70–99)
Potassium: 5.3 mmol/L — ABNORMAL HIGH (ref 3.5–5.1)
Potassium: 5.5 mmol/L — ABNORMAL HIGH (ref 3.5–5.1)
Sodium: 134 mmol/L — ABNORMAL LOW (ref 135–145)
Sodium: 133 mmol/L — ABNORMAL LOW (ref 135–145)

## 2020-11-28 LAB — URINALYSIS, COMPLETE (UACMP) WITH MICROSCOPIC
Bilirubin Urine: NEGATIVE
Glucose, UA: NEGATIVE mg/dL
Ketones, ur: NEGATIVE mg/dL
Leukocytes,Ua: NEGATIVE
Nitrite: NEGATIVE
Protein, ur: NEGATIVE mg/dL
Specific Gravity, Urine: 1.005 (ref 1.005–1.030)
pH: 5 (ref 5.0–8.0)

## 2020-11-28 LAB — GLUCOSE, CAPILLARY
Glucose-Capillary: 85 mg/dL (ref 70–99)
Glucose-Capillary: 134 mg/dL — ABNORMAL HIGH (ref 70–99)
Glucose-Capillary: 107 mg/dL — ABNORMAL HIGH (ref 70–99)
Glucose-Capillary: 118 mg/dL — ABNORMAL HIGH (ref 70–99)

## 2020-11-28 LAB — SODIUM, URINE, RANDOM: Sodium, Ur: 39 mmol/L

## 2020-11-28 LAB — PROTEIN / CREATININE RATIO, URINE
Creatinine, Urine: 33.54 mg/dL
Total Protein, Urine: <6 mg/dL

## 2020-11-28 LAB — LIPID PANEL
Cholesterol: 96 mg/dL (ref 0–200)
HDL: 58 mg/dL (ref >40)
LDL Cholesterol: 32 mg/dL (ref 0–99)
Total CHOL/HDL Ratio: 1.7 RATIO
Triglycerides: 32 mg/dL (ref <150)
VLDL: 6 mg/dL (ref 0–40)

## 2020-11-28 LAB — RETICULOCYTES
Immature Retic Fract: 20.6 % — ABNORMAL HIGH (ref 2.3–15.9)
RBC.: 2.71 MIL/uL — ABNORMAL LOW (ref 3.87–5.11)
Retic Count, Absolute: 29.3 10*3/uL (ref 19.0–186.0)
Retic Ct Pct: 1.1 % (ref 0.4–3.1)

## 2020-11-28 LAB — CREATININE, URINE, RANDOM: Creatinine, Urine: 33.59 mg/dL

## 2020-11-28 MED ORDER — SODIUM ZIRCONIUM CYCLOSILICATE 10 G PO PACK
10.0000 g | PACK | Freq: Once | ORAL | Status: AC
  Administered 2020-11-28: 10 g via ORAL
  Filled 2020-11-28: qty 1

## 2020-11-28 MED ORDER — FUROSEMIDE 10 MG/ML IJ SOLN
120.0000 mg | Freq: Three times a day (TID) | INTRAVENOUS | Status: DC
  Administered 2020-11-28 – 2020-11-29 (×3): 120 mg via INTRAVENOUS
  Filled 2020-11-28 (×2): qty 12
  Filled 2020-11-28: qty 10
  Filled 2020-11-28 (×3): qty 12

## 2020-11-28 MED ORDER — ALBUMIN HUMAN 25 % IV SOLN
12.5000 g | Freq: Once | INTRAVENOUS | Status: AC
  Administered 2020-11-28: 12.5 g via INTRAVENOUS
  Filled 2020-11-28: qty 50

## 2020-11-28 MED ORDER — SODIUM ZIRCONIUM CYCLOSILICATE 10 G PO PACK
10.0000 g | PACK | Freq: Once | ORAL | Status: DC
  Filled 2020-11-28: qty 1

## 2020-11-28 NOTE — Progress Notes (Signed)
Heart Failure Navigator Progress Note  Screened/Assessed for Heart & Vascular TOC clinic readiness.  Unfortunately at this time the patient does not meet criteria due to functional state, SCr increasing with IV lasix (CKD IV) 3.3 today.    Navigator available for reevaluation of patient.   Pricilla Holm, RN, BSN Heart Failure Nurse Navigator [phone]

## 2020-11-28 NOTE — Progress Notes (Addendum)
Subjective:  No acute overnight events.  Patient reports that she feels better today and her stomach pain has lessened.   Had a small nosebleed as we were entering. Denies any bloody stools, blood in urine, bloody emesis.   She used to follow with Dr. Jamal Maes, but she retired. Has not yet seen Dr. Jaye Beagle, her new nephrologist.    Objective:  Vital signs in last 24 hours: Vitals:   11/27/20 1156 11/27/20 2003 11/28/20 0110 11/28/20 0431  BP: (!) 121/56 (!) 118/58  127/64  Pulse: 70 70  70  Resp: '16 18  20  '$ Temp: (!) 97.5 F (36.4 C) (!) 97.5 F (36.4 C)  97.6 F (36.4 C)  TempSrc: Oral Oral  Oral  SpO2: 100% 100%  100%  Weight:   65 kg 68.4 kg   Physical Exam Constitutional:      Appearance: Normal appearance. She is not ill-appearing.  Cardiovascular:     Rate and Rhythm: Regular rhythm.     Pulses: Normal pulses.     Heart sounds: Normal heart sounds. No murmur heard. No friction rub. No gallop.      Comments: Ventricle-paced rate Pulmonary:     Effort: Pulmonary effort is normal.     Breath sounds: Normal breath sounds. No wheezing, rhonchi or rales.  Abdominal:     General: There is distension.     Tenderness: There is abdominal tenderness.     Comments: Abdominal wall edema down to sacrum (L>R)  Musculoskeletal:        General: Normal range of motion.     Comments: Left BKA. Bilateral lower extremity edema  Skin:    General: Skin is warm and dry.  Neurological:     General: No focal deficit present.     Mental Status: She is alert and oriented to person, place, and time.  Psychiatric:        Mood and Affect: Mood normal.        Behavior: Behavior normal.     Assessment/Plan:  Principal Problem:   Acute exacerbation of CHF (congestive heart failure) (HCC) Active Problems:   Complete heart block (HCC)   CKD stage 4 secondary to hypertension ([place])   Diabetes mellitus (HCC)   Paroxysmal atrial fibrillation (HCC)  [name] [name] is a 70 year  old female with PMH of CAD w/sinus arrest now s/p pacemaker, HFrEF (EF 30-35%), paroxysmal atrial fibrillation on eliquis, T2DM s/p left BKA, PVD, hyperthyroidism, CKD stage IV, and normocytic anemia presenting with increasing SHOB and weight gain, admitted for HFrEF exacerbation.  HFrEF Exacerbation CAD, Hx of sinus arrest s/p pacemaker Moderate MR, Severe TR Paroxysmal Atrial Fibrillation Patient did not respond to diuretic therapy yesterday, with only 0.75L of UOP and bladder scan this AM showing only 49m of urine. She is still volume overloaded on exam and about 24 lbs above weight at last discharge, so will need to increase lasix dose until effective diuresis is achieved. In the context of worsening kidney function, will consult nephrology for recommendations. -increase IV lasix to '120mg'$  TID -monitor UOP, strict I/O's, daily weights -continue coreg 12.'5mg'$  BID -continue PT/OT, recommending HH PT upon discharge -nephrology consulted -TOC consulted to help patient determine how to pay her home bills  AKI on CKD Stage IV Hyperkalemia As expected, kidney function worsening in the setting of aggressive diuresis. Nephrology has been consulted to provide recommendations. GFR 16 > 15 this AM. Potassium 5.3 today, gave 10g lokelma once.  Anemia Patient's Hgb gradually trending  down since admission, Hgb 8.2 this AM. Of note, patient is on xarelto for her pAF. This could be in the setting of worsening renal function or from acute blood loss anemia. Will obtain reticulocyte count for further evaluation. -f/u reticulocyte count  Prior to Admission Living Arrangement: Home Anticipated Discharge Location: TBD Barriers to Discharge: continued medical management Dispo: Anticipated discharge in approximately 2-3 day(s).   Virl Axe, MD 11/28/2020, 10:55 AM Pager: [phone] After 5pm on weekdays and 1pm on weekends: On Call pager [phone]

## 2020-11-28 NOTE — Progress Notes (Signed)
Physical Therapy Treatment Patient Details Name: [name] [name] MRN: [id number] DOB: Aug 29, 1951 Today's Date: 11/28/2020    History of Present Illness Pt is a 70 y/o female admitted secondary to CHF exacerbation. PMH includes s/p L BKA, CKD, DM, a fib, HTN, CHF, CAD, and s/p pacemaker.    PT Comments    Pt is up to side of bed with minor assist, able to do supervised and per pt needs help at times just to pivot transfer.  Has plan to go to HHPT and this is appropriate given the mobility of current level.  However, prosthetic upgrade and refitting would benefit both edema management and control of her independence.  Pt is not optimistic as her edema has been considerable and persistent.  Has plan to revisit this eventually, and hopefully the fitting of the leg can be completed to allow for her edema.  Currently will not fit on her edematous leg.  Follow for goals of acute PT and progress as tolerated.  Work on longer standing times as pt can allow.  Follow Up Recommendations  Home health PT     Equipment Recommendations  None recommended by PT    Recommendations for Other Services       Precautions / Restrictions Precautions Precautions: Fall Precaution Comments: L BKA at baseline. Restrictions Weight Bearing Restrictions: No    Mobility  Bed Mobility Overal bed mobility: Needs Assistance Bed Mobility: Sit to Supine     Supine to sit: Min guard Sit to supine: Min guard   General bed mobility comments: min A for trunk support    Transfers Overall transfer level: Needs assistance Equipment used: None Transfers: Stand Pivot Transfers Sit to Stand: Supervision         General transfer comment: no physical assistance, S for safety  Ambulation/Gait             General Gait Details: unable to take steps but is not able to wear her prosthesis   Stairs             Wheelchair Mobility    Modified Rankin (Stroke Patients Only)       Balance Overall  balance assessment: Needs assistance Sitting-balance support: Feet supported Sitting balance-Leahy Scale: Good     Standing balance support: Bilateral upper extremity supported Standing balance-Leahy Scale: Poor Standing balance comment: Reliant on BUE support                            Cognition Arousal/Alertness: Awake/alert Behavior During Therapy: WFL for tasks assessed/performed Overall Cognitive Status: Within Functional Limits for tasks assessed                                        Exercises General Exercises - Lower Extremity Ankle Circles/Pumps: AROM;Right;5 reps Quad Sets: AROM;Both;10 reps Gluteal Sets: AROM;Strengthening;Both;10 reps Heel Slides: AROM;Right;10 reps Hip ABduction/ADduction: AROM;Both;10 reps Straight Leg Raises: AROM;Both;10 reps Other Exercises Other Exercises: talked wtih pt about her plan for rehab, but pt is not thinking she can manage a prosthesis due to edema control being poor    General Comments        Pertinent Vitals/Pain Pain Assessment: Faces Faces Pain Scale: Hurts a little bit Pain Location: generalized Pain Descriptors / Indicators: Tender (LE edema) Pain Intervention(s): Repositioned    Home Living Family/patient expects to be discharged to:: Private residence Living  Arrangements: Alone Available Help at Discharge: Personal care attendant;Available PRN/intermittently Type of Home: Apartment Home Access: Level entry   Home Layout: One level Home Equipment: Shower seat;Wheelchair - manual Additional Comments: Has aide that comes 7 days/week for 3 hours    Prior Function Level of Independence: Needs assistance  Gait / Transfers Assistance Needed: Reports independence with transfers to [facility]. ADL's / Homemaking Assistance Needed: Reports aide assists with ADL/IADL tasks     PT Goals (current goals can now be found in the care plan section) Acute Rehab PT Goals Patient Stated Goal: to go home     Frequency    Min 3X/week      PT Plan Current plan remains appropriate    Co-evaluation              AM-PAC PT "6 Clicks" Mobility   Outcome Measure  Help needed turning from your back to your side while in a flat bed without using bedrails?: None Help needed moving from lying on your back to sitting on the side of a flat bed without using bedrails?: None Help needed moving to and from a bed to a chair (including a wheelchair)?: None Help needed standing up from a chair using your arms (e.g., wheelchair or bedside chair)?: A Little Help needed to walk in hospital room?: Total Help needed climbing 3-5 steps with a railing? : Total 6 Click Score: 17    End of Session   Activity Tolerance: Patient tolerated treatment well Patient left: in bed;with call bell/phone within reach;with bed alarm set Nurse Communication: Mobility status;Other (comment) PT Visit Diagnosis: Unsteadiness on feet (R26.81);Muscle weakness (generalized) (M62.81)     Time: [id number] PT Time Calculation (min) (ACUTE ONLY): 28 min  Charges:  $Therapeutic Exercise: 8-22 mins $Therapeutic Activity: 8-22 mins                   Ramond Dial 11/28/2020, 3:22 PM  Mee Hives, PT MS Acute Rehab Dept. Number: [place] [facility] [place]
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
## 2020-11-28 NOTE — Progress Notes (Signed)
Pt. Requesting Medication for frequent stools. On call for IMTS paged to make aware.

## 2020-11-28 NOTE — Progress Notes (Signed)
Patient is in bed with Legs elevated with [place] applied considering Lasix Drip limited mobility.

## 2020-11-28 NOTE — Evaluation (Signed)
Occupational Therapy Evaluation Patient Details Name: [name] [name] MRN: [id number] DOB: [birth year] Today's Date: 11/28/2020    History of Present Illness Pt is a 70 y/o female admitted secondary to CHF exacerbation. PMH includes s/p L BKA, CKD, DM, a fib, HTN, CHF, CAD, and s/p pacemaker.   Clinical Impression   Patient lives alone in apartment with level entry. Has caregiver 7 days week for 3hrs that helps "with everything," doing dishes, bathing, dressing, assist to toilet. When asked if patient can transfer herself to bathroom when caregiver is not there she says yes and able to manage LB clothing. Currently patient limited by cardiopulmonary status having dyspnea with minimal exertion after stand pivot to [facility]. Pt able to complete at S level for safety. Pt needing max A for LB dressing due to limited activity tolerance and global edema 2* CHF exacerbation "I'm so swollen its hard to move." Recommend continued acute OT services to maximize patient safety and independence with self care in order to facilitate D/C to venue listed below.    Follow Up Recommendations  Home health OT    Equipment Recommendations  Other (comment) ([place] [facility] for dressing/bathing)       Precautions / Restrictions Precautions Precautions: Fall Precaution Comments: L BKA at baseline. Restrictions Weight Bearing Restrictions: No      Mobility Bed Mobility Overal bed mobility: Needs Assistance Bed Mobility: Supine to Sit     Supine to sit: Min assist;HOB elevated     General bed mobility comments: min A for trunk support    Transfers Overall transfer level: Needs assistance Equipment used: None Transfers: Stand Pivot Transfers Sit to Stand: Supervision         General transfer comment: no physical assistance, S for safety    Balance Overall balance assessment: Needs assistance Sitting-balance support: Feet supported Sitting balance-Leahy Scale: Good     Standing balance support:  Bilateral upper extremity supported Standing balance-Leahy Scale: Poor Standing balance comment: Reliant on BUE support                           ADL either performed or assessed with clinical judgement   ADL Overall ADL's : Needs assistance/impaired Eating/Feeding: Independent   Grooming: Applying deodorant;Wash/dry face;Wash/dry hands;Set up;Sitting   Upper Body Bathing: Set up;Sitting   Lower Body Bathing: Minimal assistance;Sitting/lateral leans Lower Body Bathing Details (indicate cue type and reason): assist for foot Upper Body Dressing : Set up;Sitting   Lower Body Dressing: Sitting/lateral leans;Sit to/from stand;Maximal assistance Lower Body Dressing Details (indicate cue type and reason): patient able to stand on R LE and kneel on residual L limp to assist with doffing mesh underwear. needs assist to thread clean pair over R LE and pull up over hips. decreased activity tolerance, SOB with minimal exertion Toilet Transfer: Supervision/safety;Stand-pivot;[employer] forces technical officer Details (indicate cue type and reason): no physical assistance to pivot to [facility] however patient is SOB with minimal exertion Toileting- Clothing Manipulation and Hygiene: Maximal assistance;Sitting/lateral lean;Sit to/from stand       Functional mobility during ADLs: Supervision/safety;Wheelchair General ADL Comments: although patient states her aide assists with LB dressing, pt states she is able to manage LB clothing for toileting/dressing at home. patient states increased difficulty due to global edema/tightness in extremities. patient also limited by dyspnea with exertion                  Pertinent Vitals/Pain Pain Assessment: Faces Faces Pain Scale: Hurts little more  Pain Location: generalized Pain Descriptors / Indicators: Tightness;Other (Comment) (swollen) Pain Intervention(s): Monitored during session     Hand Dominance Right   Extremity/Trunk Assessment Upper Extremity  Assessment Upper Extremity Assessment: Generalized weakness (L UE edema > R UE)   Lower Extremity Assessment Lower Extremity Assessment: Defer to PT evaluation   Cervical / Trunk Assessment Cervical / Trunk Assessment: Normal   Communication Communication Communication: No difficulties   Cognition Arousal/Alertness: Awake/alert Behavior During Therapy: WFL for tasks assessed/performed Overall Cognitive Status: Within Functional Limits for tasks assessed                                        Exercises Exercises: Other exercises Other Exercises Other Exercises: educate patient in elevating UEs and performing fist pumps 10-20/hr as able to assist with edema management. pt performed x10 during session        [place] expects to be discharged to:: Private residence Living Arrangements: Alone Available Help at Discharge: Personal care attendant;Available PRN/intermittently Type of Home: Apartment Home Access: Level entry     Home Layout: One level     Bathroom Shower/Tub: Occupational psychologist: Handicapped height     Home Equipment: Shower seat;Wheelchair - manual   Additional Comments: Has aide that comes 7 days/week for 3 hours      Prior Functioning/Environment Level of Independence: Needs assistance  Gait / Transfers Assistance Needed: Reports independence with transfers to [facility]. ADL's / Homemaking Assistance Needed: Reports aide assists with ADL/IADL tasks            OT Problem List: Decreased strength;Decreased activity tolerance;Decreased safety awareness;Pain;Increased edema;Cardiopulmonary status limiting activity      OT Treatment/Interventions: Therapeutic exercise;Self-care/ADL training;Therapeutic activities;Patient/family education;Balance training    OT Goals(Current goals can be found in the care plan section) Acute Rehab OT Goals Patient Stated Goal: to go home OT Goal Formulation: With patient Time  For Goal Achievement: 12/12/20 Potential to Achieve Goals: Good  OT Frequency: Min 2X/week    AM-PAC OT "6 Clicks" Daily Activity     Outcome Measure Help from another person eating meals?: None Help from another person taking care of personal grooming?: A Little Help from another person toileting, which includes using toliet, bedpan, or urinal?: A Lot Help from another person bathing (including washing, rinsing, drying)?: A Little Help from another person to put on and taking off regular upper body clothing?: A Little Help from another person to put on and taking off regular lower body clothing?: A Lot 6 Click Score: 17   End of Session Nurse Communication: Mobility status  Activity Tolerance: Patient tolerated treatment well Patient left: in bed;with call bell/phone within reach  OT Visit Diagnosis: Other abnormalities of gait and mobility (R26.89);Muscle weakness (generalized) (M62.81);Pain Pain - part of body:  (global)                Time: [id number] OT Time Calculation (min): 29 min Charges:  OT General Charges $OT Visit: 1 Visit OT Evaluation $OT Eval Moderate Complexity: 1 Mod OT Treatments $Self Care/Home Management : 8-22 mins  Delbert Phenix OT OT pager: [place] 11/28/2020, 1:41 PM

## 2020-11-29 DIAGNOSIS — I130 Hypertensive heart and chronic kidney disease with heart failure and stage 1 through stage 4 chronic kidney disease, or unspecified chronic kidney disease: Secondary | ICD-10-CM | POA: Diagnosis not present

## 2020-11-29 DIAGNOSIS — N179 Acute kidney failure, unspecified: Secondary | ICD-10-CM | POA: Diagnosis not present

## 2020-11-29 DIAGNOSIS — N184 Chronic kidney disease, stage 4 (severe): Secondary | ICD-10-CM | POA: Diagnosis not present

## 2020-11-29 DIAGNOSIS — E871 Hypo-osmolality and hyponatremia: Secondary | ICD-10-CM

## 2020-11-29 DIAGNOSIS — I5023 Acute on chronic systolic (congestive) heart failure: Secondary | ICD-10-CM | POA: Diagnosis not present

## 2020-11-29 LAB — BASIC METABOLIC PANEL
Anion gap: 11 (ref 5–15)
Anion gap: 14 (ref 5–15)
BUN: 73 mg/dL — ABNORMAL HIGH (ref 8–23)
BUN: 75 mg/dL — ABNORMAL HIGH (ref 8–23)
CO2: 24 mmol/L (ref 22–32)
CO2: 22 mmol/L (ref 22–32)
Calcium: 8.8 mg/dL — ABNORMAL LOW (ref 8.9–10.3)
Calcium: 8.9 mg/dL (ref 8.9–10.3)
Chloride: 95 mmol/L — ABNORMAL LOW (ref 98–111)
Chloride: 97 mmol/L — ABNORMAL LOW (ref 98–111)
Creatinine, Ser: 3.31 mg/dL — ABNORMAL HIGH (ref 0.44–1.00)
Creatinine, Ser: 3.48 mg/dL — ABNORMAL HIGH (ref 0.44–1.00)
GFR, Estimated: 15 mL/min — ABNORMAL LOW (ref >60)
GFR, Estimated: 14 mL/min — ABNORMAL LOW (ref >60)
Glucose, Bld: 115 mg/dL — ABNORMAL HIGH (ref 70–99)
Glucose, Bld: 117 mg/dL — ABNORMAL HIGH (ref 70–99)
Potassium: 4.9 mmol/L (ref 3.5–5.1)
Potassium: 4.7 mmol/L (ref 3.5–5.1)
Sodium: 130 mmol/L — ABNORMAL LOW (ref 135–145)
Sodium: 133 mmol/L — ABNORMAL LOW (ref 135–145)

## 2020-11-29 LAB — GLUCOSE, CAPILLARY
Glucose-Capillary: 107 mg/dL — ABNORMAL HIGH (ref 70–99)
Glucose-Capillary: 121 mg/dL — ABNORMAL HIGH (ref 70–99)
Glucose-Capillary: 91 mg/dL (ref 70–99)
Glucose-Capillary: 118 mg/dL — ABNORMAL HIGH (ref 70–99)

## 2020-11-29 LAB — CBC
HCT: 25.4 % — ABNORMAL LOW (ref 36.0–46.0)
Hemoglobin: 8.1 g/dL — ABNORMAL LOW (ref 12.0–15.0)
MCH: 28.7 pg (ref 26.0–34.0)
MCHC: 31.9 g/dL (ref 30.0–36.0)
MCV: 90.1 fL (ref 80.0–100.0)
Platelets: 107 10*3/uL — ABNORMAL LOW (ref 150–400)
RBC: 2.82 MIL/uL — ABNORMAL LOW (ref 3.87–5.11)
RDW: 16.7 % — ABNORMAL HIGH (ref 11.5–15.5)
WBC: 7.6 10*3/uL (ref 4.0–10.5)
nRBC: 0 % (ref 0.0–0.2)

## 2020-11-29 LAB — IRON AND TIBC
Iron: 44 ug/dL (ref 28–170)
Saturation Ratios: 15 % (ref 10.4–31.8)
TIBC: 293 ug/dL (ref 250–450)
UIBC: 249 ug/dL

## 2020-11-29 LAB — FERRITIN: Ferritin: 135 ng/mL (ref 11–307)

## 2020-11-29 MED ORDER — FUROSEMIDE 10 MG/ML IJ SOLN
30.0000 mg/h | INTRAVENOUS | Status: DC
  Administered 2020-11-29 (×2): 20 mg/h via INTRAVENOUS
  Administered 2020-11-30: 30 mg/h via INTRAVENOUS
  Administered 2020-11-30: 20 mg/h via INTRAVENOUS
  Administered 2020-11-30 – 2020-12-03 (×10): 30 mg/h via INTRAVENOUS
  Filled 2020-11-29 (×18): qty 20

## 2020-11-29 MED ORDER — DOBUTAMINE IN D5W 4-5 MG/ML-% IV SOLN
4.0000 ug/kg/min | INTRAVENOUS | Status: DC
  Administered 2020-11-29: 2.5 ug/kg/min via INTRAVENOUS
  Administered 2020-12-02: 4 ug/kg/min via INTRAVENOUS
  Filled 2020-11-29 (×2): qty 250

## 2020-11-29 MED ORDER — METOLAZONE 5 MG PO TABS
5.0000 mg | ORAL_TABLET | Freq: Once | ORAL | Status: AC
  Administered 2020-11-29: 5 mg via ORAL
  Filled 2020-11-29: qty 1

## 2020-11-29 MED ORDER — CARVEDILOL 3.125 MG PO TABS: 3.1250 mg | ORAL_TABLET | Freq: Two times a day (BID) | ORAL | Status: DC

## 2020-11-29 MED ORDER — LOPERAMIDE HCL 2 MG PO CAPS
2.0000 mg | ORAL_CAPSULE | ORAL | Status: DC | PRN
  Administered 2020-11-29 – 2020-12-07 (×10): 2 mg via ORAL
  Filled 2020-11-29 (×11): qty 1

## 2020-11-29 NOTE — Progress Notes (Addendum)
Patient ID: [name] [name], female   DOB: 1951/06/10, 70 y.o.   MRN: [id number] S: complaining of diarrhea and SOB O:BP 118/86 (BP Location: Right Arm)   Pulse 69   Temp 98.1 F (36.7 C) (Oral)   Resp 20   Wt 68.4 kg Comment: scale b  SpO2 100%   BMI 27.56 kg/m   Intake/Output Summary (Last 24 hours) at 11/29/2020 1113 Last data filed at 11/29/2020 [id number] Gross per 24 hour  Intake 670 ml  Output 800 ml  Net -130 ml   Intake/Output: I/O last 3 completed shifts: In: 29 [P.O.:810; IV Piggyback:100] Out: [id number] [Urine:875]  Intake/Output this shift:  Total I/O In: 240 [P.O.:240] Out: 400 [Urine:400] Weight change:  Gen: mild distress, fatigued CVS: RRR Resp: decreased BS at bases otherwise good air movement Abd: distended, tense, +tender, + abd wall edema Ext: 2+ anasarca  Recent Labs  Lab 11/24/20 0302 11/25/20 0406 11/26/20 0233 11/27/20 0338 11/28/20 0347 11/28/20 1646 11/29/20 0407  NA 139 141 138 135 134* 133* 130*  K 4.8 4.7 4.4 4.8 5.3* 5.5* 4.9  CL 107 106 103 99 98 96* 95*  CO2 '25 26 26 25 25 26 24  '$ GLUCOSE 101* 100* 119* 75 73 103* 115*  BUN 55* 58* 62* 66* 75* 72* 73*  CREATININE 3.36* 3.32* 3.12* 3.09* 3.30* 3.33* 3.31*  CALCIUM 8.6* 8.6* 8.7* 8.8* 8.9 8.9 8.8*   Liver Function Tests: No results for input(s): AST, ALT, ALKPHOS, BILITOT, PROT, ALBUMIN in the last 168 hours. No results for input(s): LIPASE, AMYLASE in the last 168 hours. No results for input(s): AMMONIA in the last 168 hours. CBC: Recent Labs  Lab 11/23/20 0259 11/24/20 0302 11/25/20 0406 11/27/20 0338 11/28/20 0347  WBC 4.8 5.2 4.6 4.9 5.1  NEUTROABS  --   --   --   --  2.8  HGB 9.2* 8.9* 8.8* 8.5* 8.2*  HCT 27.7* 27.6* 27.9* 26.2* 24.7*  MCV 88.8 89.3 90.6 88.8 89.2  PLT 138* 131* 117* 84* 100*   Cardiac Enzymes: No results for input(s): CKTOTAL, CKMB, CKMBINDEX, TROPONINI in the last 168 hours. CBG: Recent Labs  Lab 11/28/20 0619 11/28/20 1208 11/28/20 1621  11/28/20 2130 11/29/20 0611  GLUCAP 107* 118* 107* 121* 91    Iron Studies: No results for input(s): IRON, TIBC, TRANSFERRIN, FERRITIN in the last 72 hours. Studies/Results: No results found. Marland Kitchen albuterol  2.5 mg Nebulization BID  . atorvastatin  40 mg Oral Daily  . calcitRIOL  0.5 mcg Oral Daily  . carvedilol  12.5 mg Oral BID WC  . Rivaroxaban  15 mg Oral Q supper  . sodium zirconium cyclosilicate  10 g Oral Once    BMET    Component Value Date/Time   NA 130 (L) 11/29/2020 0407   NA 142 11/08/2020 1039   K 4.9 11/29/2020 0407   CL 95 (L) 11/29/2020 0407   CO2 24 11/29/2020 0407   GLUCOSE 115 (H) 11/29/2020 0407   BUN 73 (H) 11/29/2020 0407   BUN 21 11/08/2020 1039   CREATININE 3.31 (H) 11/29/2020 0407   CREATININE 2.26 (H) 04/07/2018 1330   CALCIUM 8.8 (L) 11/29/2020 0407   CALCIUM 9.3 08/28/2011 1648   GFRNONAA 15 (L) 11/29/2020 0407   GFRNONAA 29 (L) 11/10/2012 1115   GFRAA 28 (L) 11/08/2020 1039   GFRAA 33 (L) 11/10/2012 1115   CBC    Component Value Date/Time   WBC 5.1 11/28/2020 0347   RBC 2.71 (L) 11/28/2020 1646  RBC 2.77 (L) 11/28/2020 0347   HGB 8.2 (L) 11/28/2020 0347   HGB 11.7 11/25/2018 0931   HCT 24.7 (L) 11/28/2020 0347   HCT 35.8 11/25/2018 0931   PLT 100 (L) 11/28/2020 0347   PLT 146 (L) 11/25/2018 0931   MCV 89.2 11/28/2020 0347   MCV 93 11/25/2018 0931   MCH 29.6 11/28/2020 0347   MCHC 33.2 11/28/2020 0347   RDW 16.6 (H) 11/28/2020 0347   RDW 13.1 11/25/2018 0931   LYMPHSABS 1.2 11/28/2020 0347   LYMPHSABS 2.0 11/25/2018 0931   MONOABS 0.8 11/28/2020 0347   EOSABS 0.3 11/28/2020 0347   EOSABS 0.7 (H) 11/25/2018 0931   BASOSABS 0.0 11/28/2020 0347   BASOSABS 0.1 11/25/2018 0931    Assessment/Plan: 1.  AKI/CKD stage IV in setting of decompensated systolic and diastolic CHF with escalating doses of IV lasix.  Likely cardiorenal syndrome.   1. Not responding to increased IV lasix 120 mg q6 and IV albumin 2. Agree with lasix drip and  follow. 3. She is a poor candidate for dialysis given her multiple co-morbidities (especially her cardiomyopathy) and poor functional and nutritional status.  She is a full code and recommend Palliative care consult to help set goals/limits of care.  2. Acute on chronic systolic and diastolic CHF- profound volume overload.  Unclear why her weight increased since her past hospitalization.  Recommend consulting the advanced Heart Failure team to help guide care as she may require ionotropic agents. 3. Abdominal pain- she is markedly distended and has significant pain with minimal pressure.  She has a history of gallstones and thickened gallbladder wall.  Possible acute cholecystitis.  Recommend CT scan of abdomen to further evaluate. 4. Thrombocytopenia - cont to follow 5. Severe protein malnutrition - alb 2.4 and will need protein supplementation. 6. Moderate MR and severe TR- as above recommend heart failure team consultation 7. Hyperkalemia- due to #1.  improved with lokelma and increase in IV lasix dose 8. Hyponatremia - due to volume overload.   9. Anemia of CKD stage IV- cont to follow and transfuse prn.  Will check iron stores and start ESA.   Donetta Potts, MD [employer] [phone]

## 2020-11-29 NOTE — Progress Notes (Signed)
Patient has had several loose stools since 1900. Visible bright red blood noted in stool. Immodium given but ineffective.  Dr. Lisabeth Devoid notified via [facility] communication.

## 2020-11-29 NOTE — Care Management Important Message (Signed)
Important Message  Patient Details  Name: Santoya Heming MRN: [id number] Date of Birth: 1951-01-12   Medicare Important Message Given:  Yes     Shelda Altes 11/29/2020, 9:06 AM
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
## 2020-11-29 NOTE — TOC Initial Note (Addendum)
Transition of Care [facility]) - Initial/Assessment Note    Patient Details  Name: [name] [name] MRN: [id number] Date of Birth: 1951/01/21  Transition of Care [facility] CM/SW Contact:    [name] Mayo, RN Phone Number: 11/29/2020, 3:01 PM  Clinical Narrative:                 NCM spoke with patient at bedside, she lives alone, she has walker, w/chair, prosthetic leg (L), bsc, shower stool. NCM offered choice for  HHPT, HHOT, she states she always deals with [facility].  NCM made referral to [facility].  Awaiting call back from [place].  Per [name] [name] , she can take referral.  Soc will begin 24 to 48 hrs post dc.  She states she she has transportation at discharge. She states she does not have her Medicare card , NCM informed her to call DSS to get them to mail her a new Medicare Card.    Expected Discharge Plan: [place] Barriers to Discharge: Continued Medical Work up   Patient Goals and CMS Choice Patient states their goals for this hospitalization and ongoing recovery are:: get better CMS Medicare.gov Compare Post Acute Care list provided to:: Patient Choice offered to / list presented to : Patient  Expected Discharge Plan and Services Expected Discharge Plan: [place] In-house Referral: NA Discharge Planning Services: CM Consult Post Acute Care Choice: [place] arrangements for the past 2 months: Single Family Home                   DME Agency: NA       HH Arranged: PT,OT [place] Agency: [place] ([place]) Date HH Agency Contacted: 11/29/20 Time HH Agency Contacted: 1500 Representative spoke with at [place]: [place] Arrangements/Services Living arrangements for the past 2 months: [place] with:: Self Patient language and need for interpreter reviewed:: Yes Do you feel safe going back to the place where you live?: Yes      Need for Family Participation in Patient Care: No (Comment) Care giver  support system in place?: No (comment)   Criminal Activity/Legal Involvement Pertinent to Current Situation/Hospitalization: No - Comment as needed  Activities of Daily Living      Permission Sought/Granted                  Emotional Assessment Appearance:: Appears stated age Attitude/Demeanor/Rapport: Engaged Affect (typically observed): Appropriate Orientation: : Oriented to Self,Oriented to Place,Oriented to  Time,Oriented to Situation Alcohol / Substance Use: Not Applicable Psych Involvement: No (comment)  Admission diagnosis:  Acute exacerbation of CHF (congestive heart failure) ([place]) [I50.9] Patient Active Problem List   Diagnosis Date Noted  . Hypervolemia 11/21/2020  . Hyperthyroidism 11/14/2020  . Acute cholecystitis 11/11/2020  . Callus 11/07/2020  . Heart failure with reduced ejection fraction ([place]) 10/05/2020  . Paroxysmal atrial fibrillation ([place]) 08/30/2020  . Secondary hypercoagulable state ([place]) 08/30/2020  . Diabetes mellitus ([place]) 07/13/2020  . Acute exacerbation of CHF (congestive heart failure) ([place]) 06/20/2020  . Pruritus 06/20/2020  . Status post bilateral below knee amputation ([place]) 04/20/2019  . Chronic cough 08/03/2018  . Status post reverse total shoulder replacement, left   . Neck pain, chronic 10/20/2017  . Cognitive impairment 08/28/2016  . Cardiomyopathy ([place])   . Vitamin D deficiency 06/01/2014  . 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[place]) 03/11/2013  . Preventative health care 02/09/2013  . Below knee amputation status, left 12/20/2012  .  CKD stage 4 secondary to hypertension ([place]) 11/04/2012  . Normocytic anemia 11/04/2012  . 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 Chronic diarrhea 08/25/2012  . Complete heart block ([place])   . PACEMAKER-St.Jude 03/15/2010  . HYPERPARATHYROIDISM, SECONDARY 01/16/2010  . Hyperlipidemia 11/23/2007  . DIABETIC  RETINOPATHY 08/12/2006  . DIABETIC PERIPHERAL NEUROPATHY  08/12/2006  . Essential hypertension 08/12/2006  . PERIPHERAL VASCULAR DISEASE 08/12/2006   PCP:  [name] Payment, MD Pharmacy:   [place], [place] - [address] [place] [address] Phone: [phone] Fax: [place], [place] [address] [place] [address] Phone: [phone] Fax: [phone]     Social Determinants of Health (SDOH) Interventions Food Insecurity Interventions: Intervention Not Indicated  Readmission Risk Interventions Readmission Risk Prevention Plan 11/29/2020  Transportation Screening Complete  PCP or Specialist Appt within 3-5 Days Complete  HRI or Home Care Consult Complete  Social Work Consult for [place] Planning/Counseling Complete  Palliative Care Screening Not Applicable  Medication Review Press photographer) Complete  Some recent data might be hidden

## 2020-11-29 NOTE — Progress Notes (Signed)
CSW and HFCSW, Cortlin, met with pt bedside to address pt's concerns about bills. Pt states she has always paid her bills on time. CSW notified pt that Dr. Court Joy spoke with pt's apartment complex and that they are aware pt is in the hospital and that pt does not need to be concerned about rent. Pt still had concerns about [employer], power, and cable bills. Pt states she only pays her bills with certified check where she has to go to the bank in person to obtain the checks. CSW explained if she wanted bills to be paid she may need to have family assist in paying them. Pt then explained that she does not want any family or friends involved in her money. CSW shifted conversation about the importance of pt's health and it's priority at this time. CSW reiterated pt's good history with rent payment and that her apartment complex is aware of her situation. Pt appears calmer about the situation after discussion and expresses and understanding about the importance of staying in the hospital for her health. CSW exited the room and Cortlin remained to continue [place] screening.

## 2020-11-29 NOTE — Plan of Care (Signed)
  Problem: Clinical Measurements: Goal: Respiratory complications will improve Outcome: Progressing   Problem: Coping: Goal: Level of anxiety will decrease Outcome: Progressing   Problem: Safety: Goal: Ability to remain free from injury will improve Outcome: Progressing   

## 2020-11-29 NOTE — Progress Notes (Signed)
   Subjective:   No acute overnight events.  Patient very concerned about paying her rent and utility bills and is considering leaving AMA to pay them and then come back. Discussed with her at length about risks of doing this. TOC consulted yesterday to see what can be done to help with this, will reach out again today.  Objective:  Vital signs in last 24 hours: Vitals:   11/28/20 0431 11/28/20 1207 11/28/20 2141 11/29/20 0353  BP: 127/64 (!) 119/59 121/79 118/86  Pulse: 70 70 70 69  Resp: '20 18 20 20  '$ Temp: 97.6 F (36.4 C) 98.2 F (36.8 C) (!) 97.4 F (36.3 C) 98.1 F (36.7 C)  TempSrc: Oral Oral Oral Oral  SpO2: 100% 100% 100% 100%  Weight: 68.4 kg      Physical Exam: General: ill-appearing elderly female sitting up in bed, NAD. CV: ventricle-paced rate, regular rhythm. Abdomen: distended, mild diffuse tenderness to palpation, abdominal wall edema down to sacrum (L>R). MSK: Left BKA, bilateral lower extremity edema. Skin: warm and dry Neuro: alert and oriented x3, no focal deficit present.  Assessment/Plan:  Principal Problem:   Acute exacerbation of CHF (congestive heart failure) (HCC) Active Problems:   Complete heart block (HCC)   CKD stage 4 secondary to hypertension ([place])   Diabetes mellitus (HCC)   Paroxysmal atrial fibrillation (HCC)  [name] [name] is a 70 year old female with PMH of CAD w/sinus arrest now s/p pacemaker, HFrEF (EF 30-35%), paroxysmal atrial fibrillation on eliquis, CKD stage IV, and normocytic anemia presenting with increasing SHOB and weight gain, admitted for HFrEF exacerbation.  HFrEF Exacerbation CAD, Hx of sinus arrest s/p pacemaker Moderate MR, Severe TR Paroxysmal Atrial Fibrillation Patient still oliguric despite increasing lasix dose yesterday. She remains severely volume overloaded and will need more aggressive diuresis. In the context of worsening kidney function, nephrology is following. At this time, patient's clinical condition  is worsening and there is likely a component of cardiogenic shock as patient is not appropriately responding to diuresis. Consulted Advanced Heart Failure team today as patient may require inotropic support in conjunction with aggressive diuresis to offload volume -changed to lasix infusion '@20mg'$ /hr -one dose of metolazone '5mg'$  to enhance diuresis -appreciate nephrology and cardiology recommendations -monitor UOP, strict I/O's, daily weights -continue coreg 12.'5mg'$  BID -spoke with social worker, Silverio Lay, to see how we can help patient with home financial stressors  AKI on CKD Stage IV Hyperkalemia Hyponatremia Likely a component of cardiorenal syndrome. Expect kidney function to be impacted in the setting of aggressive diuresis, but hopeful that there will be improvement once patient's volume overload resolves. As per nephrology, patient is a poor candidate for dialysis given multiple comorbidities. Potassium improved this AM, now 4.9. Sodium 130 this AM, likely reflecting inability to remove excess free water. Protein/creatinine ratio not concerning for nephrotic syndrome.  Anemia Reticulocyte count inappropriately normal, likely suggesting anemia is secondary to worsening renal disease and not from acute blood loss anemia.   Prior to Admission Living Arrangement: Home Anticipated Discharge Location: TBD Barriers to Discharge: continued medical management Dispo: Anticipated discharge in approximately 3-4 day(s).   Virl Axe, MD 11/29/2020, 11:06 AM Pager: [phone] After 5pm on weekdays and 1pm on weekends: On Call pager [phone]

## 2020-11-30 DIAGNOSIS — N179 Acute kidney failure, unspecified: Secondary | ICD-10-CM | POA: Diagnosis not present

## 2020-11-30 DIAGNOSIS — I130 Hypertensive heart and chronic kidney disease with heart failure and stage 1 through stage 4 chronic kidney disease, or unspecified chronic kidney disease: Secondary | ICD-10-CM | POA: Diagnosis not present

## 2020-11-30 DIAGNOSIS — I5023 Acute on chronic systolic (congestive) heart failure: Secondary | ICD-10-CM | POA: Diagnosis not present

## 2020-11-30 DIAGNOSIS — N184 Chronic kidney disease, stage 4 (severe): Secondary | ICD-10-CM | POA: Diagnosis not present

## 2020-11-30 LAB — CBC
HCT: 23.6 % — ABNORMAL LOW (ref 36.0–46.0)
Hemoglobin: 7.7 g/dL — ABNORMAL LOW (ref 12.0–15.0)
MCH: 28.9 pg (ref 26.0–34.0)
MCHC: 32.6 g/dL (ref 30.0–36.0)
MCV: 88.7 fL (ref 80.0–100.0)
Platelets: 114 10*3/uL — ABNORMAL LOW (ref 150–400)
RBC: 2.66 MIL/uL — ABNORMAL LOW (ref 3.87–5.11)
RDW: 16.3 % — ABNORMAL HIGH (ref 11.5–15.5)
WBC: 6.6 10*3/uL (ref 4.0–10.5)
nRBC: 0 % (ref 0.0–0.2)

## 2020-11-30 LAB — HEPATIC FUNCTION PANEL
ALT: 24 U/L (ref 0–44)
AST: 37 U/L (ref 15–41)
Albumin: 2.7 g/dL — ABNORMAL LOW (ref 3.5–5.0)
Alkaline Phosphatase: 92 U/L (ref 38–126)
Bilirubin, Direct: 0.4 mg/dL — ABNORMAL HIGH (ref 0.0–0.2)
Indirect Bilirubin: 1.1 mg/dL — ABNORMAL HIGH (ref 0.3–0.9)
Total Bilirubin: 1.5 mg/dL — ABNORMAL HIGH (ref 0.3–1.2)
Total Protein: 5.9 g/dL — ABNORMAL LOW (ref 6.5–8.1)

## 2020-11-30 LAB — BASIC METABOLIC PANEL
Anion gap: 15 (ref 5–15)
BUN: 87 mg/dL — ABNORMAL HIGH (ref 8–23)
CO2: 21 mmol/L — ABNORMAL LOW (ref 22–32)
Calcium: 8.9 mg/dL (ref 8.9–10.3)
Chloride: 95 mmol/L — ABNORMAL LOW (ref 98–111)
Creatinine, Ser: 3.55 mg/dL — ABNORMAL HIGH (ref 0.44–1.00)
GFR, Estimated: 14 mL/min — ABNORMAL LOW (ref >60)
Glucose, Bld: 66 mg/dL — ABNORMAL LOW (ref 70–99)
Potassium: 5.6 mmol/L — ABNORMAL HIGH (ref 3.5–5.1)
Sodium: 131 mmol/L — ABNORMAL LOW (ref 135–145)

## 2020-11-30 LAB — GLUCOSE, CAPILLARY
Glucose-Capillary: 178 mg/dL — ABNORMAL HIGH (ref 70–99)
Glucose-Capillary: 104 mg/dL — ABNORMAL HIGH (ref 70–99)
Glucose-Capillary: 72 mg/dL (ref 70–99)
Glucose-Capillary: 67 mg/dL — ABNORMAL LOW (ref 70–99)
Glucose-Capillary: 72 mg/dL (ref 70–99)
Glucose-Capillary: 112 mg/dL — ABNORMAL HIGH (ref 70–99)
Glucose-Capillary: 71 mg/dL (ref 70–99)

## 2020-11-30 LAB — MAGNESIUM: Magnesium: 2.2 mg/dL (ref 1.7–2.4)

## 2020-11-30 LAB — IMMUNOFIXATION, URINE

## 2020-11-30 MED ORDER — SODIUM ZIRCONIUM CYCLOSILICATE 10 G PO PACK
10.0000 g | PACK | Freq: Two times a day (BID) | ORAL | Status: AC
  Administered 2020-11-30 (×2): 10 g via ORAL
  Filled 2020-11-30 (×2): qty 1

## 2020-11-30 MED ORDER — APIXABAN 5 MG PO TABS
5.0000 mg | ORAL_TABLET | Freq: Two times a day (BID) | ORAL | Status: DC
  Administered 2020-11-30: 5 mg via ORAL
  Filled 2020-11-30: qty 1

## 2020-11-30 MED ORDER — SODIUM CHLORIDE 0.9 % IV SOLN
510.0000 mg | Freq: Once | INTRAVENOUS | Status: AC
  Administered 2020-11-30: 510 mg via INTRAVENOUS
  Filled 2020-11-30: qty 17

## 2020-11-30 MED ORDER — METOLAZONE 5 MG PO TABS
5.0000 mg | ORAL_TABLET | Freq: Once | ORAL | Status: AC
  Administered 2020-11-30: 5 mg via ORAL
  Filled 2020-11-30: qty 1

## 2020-11-30 MED ORDER — DEXTROSE 50 % IV SOLN
12.5000 g | INTRAVENOUS | Status: AC
  Administered 2020-11-30: 12.5 g via INTRAVENOUS
  Filled 2020-11-30: qty 50

## 2020-11-30 MED ORDER — OXYCODONE HCL 5 MG PO TABS
5.0000 mg | ORAL_TABLET | Freq: Four times a day (QID) | ORAL | Status: AC | PRN
  Administered 2020-11-30 – 2020-12-02 (×2): 5 mg via ORAL
  Filled 2020-11-30 (×2): qty 1

## 2020-11-30 MED ORDER — DARBEPOETIN ALFA 25 MCG/0.42ML IJ SOSY
25.0000 ug | PREFILLED_SYRINGE | INTRAMUSCULAR | Status: DC
  Administered 2020-11-30: 25 ug via SUBCUTANEOUS
  Filled 2020-11-30: qty 0.42

## 2020-11-30 NOTE — Progress Notes (Signed)
Physical Therapy Treatment Patient Details Name: [name] [name] MRN: [id number] DOB: 1950-11-23 Today's Date: 11/30/2020    History of Present Illness Pt is a 70 y/o female admitted secondary to CHF exacerbation. PMH includes s/p L BKA, CKD, DM, a fib, HTN, CHF, CAD, and s/p pacemaker.    PT Comments    Pt sitting EOB with bed alarm going off upon arrival to room, pt needing to use the [facility]. Pt requires min assist for to and from [facility] at this time for steadying, guiding pt to destination surface. Pt with tight edema in bilateral LEs, PT instructed pt in LE exercises at EOB for assist with edema management. Pt with chronic-appearing weeping wound on R mid-shin, bleeding slightly during session, RN aware and dressing wound on PT exit. PT to continue to follow.     Follow Up Recommendations  Home health PT     Equipment Recommendations  None recommended by PT    Recommendations for Other Services       Precautions / Restrictions Precautions Precautions: Fall Precaution Comments: L BKA Restrictions Weight Bearing Restrictions: No    Mobility  Bed Mobility Overal bed mobility: Needs Assistance             General bed mobility comments: sitting EOB upon PT arrival, sitting EOB with RN dressing RLE wound upon PT exit    Transfers Overall transfer level: Needs assistance Equipment used: 1 person hand held assist Transfers: Sit to/from [facility] Sit to Stand: Min assist Stand pivot transfers: Min assist       General transfer comment: Min assist for steadying during stand and pivot to/from [facility]. STS x2, pt able to perform own pericare.  Ambulation/Gait             General Gait Details: NT   Stairs             Wheelchair Mobility    Modified Rankin (Stroke Patients Only)       Balance Overall balance assessment: Needs assistance Sitting-balance support: Feet supported Sitting balance-Leahy Scale: Good     Standing balance  support: Single extremity supported;During functional activity Standing balance-Leahy Scale: Poor Standing balance comment: reliant on PT support for steadying                            Cognition Arousal/Alertness: Awake/alert Behavior During Therapy: WFL for tasks assessed/performed Overall Cognitive Status: Within Functional Limits for tasks assessed                                        Exercises General Exercises - Lower Extremity Long Arc Quad: AROM;Both;15 reps;Seated    General Comments General comments (skin integrity, edema, etc.): weeping, serosanguinous wound on R shin, RN called to room to dress      Pertinent Vitals/Pain Pain Assessment: Faces Faces Pain Scale: Hurts a little bit Pain Location: LEs, during exercise Pain Descriptors / Indicators: Tender;Tightness Pain Intervention(s): Limited activity within patient's tolerance;Monitored during session;Repositioned    Home Living                      Prior Function            PT Goals (current goals can now be found in the care plan section) Acute Rehab PT Goals Patient Stated Goal: to go home and eat PT  Goal Formulation: With patient Time For Goal Achievement: 12/11/20 Potential to Achieve Goals: Good Progress towards PT goals: Progressing toward goals    Frequency    Min 3X/week      PT Plan Current plan remains appropriate    Co-evaluation              AM-PAC PT "6 Clicks" Mobility   Outcome Measure  Help needed turning from your back to your side while in a flat bed without using bedrails?: None Help needed moving from lying on your back to sitting on the side of a flat bed without using bedrails?: None Help needed moving to and from a bed to a chair (including a wheelchair)?: None Help needed standing up from a chair using your arms (e.g., wheelchair or bedside chair)?: A Little Help needed to walk in hospital room?: A Lot Help needed climbing  3-5 steps with a railing? : A Lot 6 Click Score: 19    End of Session   Activity Tolerance: Patient tolerated treatment well Patient left: in bed;with call bell/phone within reach;with bed alarm set - RN to set bed alarm upon exit Nurse Communication: Mobility status;Other (comment) PT Visit Diagnosis: Unsteadiness on feet (R26.81);Muscle weakness (generalized) (M62.81)     Time: [id number] PT Time Calculation (min) (ACUTE ONLY): 14 min  Charges:  $Therapeutic Activity: 8-22 mins                     Stacie Glaze, PT Acute Rehabilitation Services Pager [phone]  Office [phone]  Roxine Caddy E Ruffin Pyo 11/30/2020, 4:16 PM

## 2020-11-30 NOTE — Progress Notes (Addendum)
   Subjective:   Patient had one episode of bloody diarrhea yesterday night, but CBC stable and no further blood in stools thereafter. Also, had an incident where IV pole fell and bumped patient's head during transfer to bathroom, although no signs of bleeding or bruising and frequent neurochecks reassuring. Given oxycodone for pain.  Reports feeling fine today, but frustrated that she cannot have a meal this AM due to possible procedure today.   Objective:  Vital signs in last 24 hours: Vitals:   11/30/20 0400 11/30/20 0500 11/30/20 0917 11/30/20 1130  BP: 112/64 (!) 105/51  (!) 107/49  Pulse: 70 70 71 70  Resp: '18 18 16 16  '$ Temp: 98.2 F (36.8 C) 98.2 F (36.8 C)  98.2 F (36.8 C)  TempSrc: Oral Oral  Oral  SpO2: 100% 95% 97% 97%  Weight:       General: ill-appearing elderly female, lying in bed, NAD. CV: ventricle-paced rate, regular rhythm. Abdomen: distended, mild diffuse tenderness to palpation, abdominal wall edema down to sacrum. MSK: left BKA, bilateral lower extremity edema. Skin: warm and dry Neuro: AAOx3, no focal deficit noted.  Assessment/Plan:  Principal Problem:   Acute exacerbation of CHF (congestive heart failure) (HCC) Active Problems:   Complete heart block (HCC)   CKD stage 4 secondary to hypertension ([place])   Diabetes mellitus (HCC)   Paroxysmal atrial fibrillation (HCC)  [name] [name] is a 70 year old female with PMH of CAD w/sinus arrest now s/p pacemaker, HFrEF (EF 30-35%), paroxysmal atrial fibrillation on eliquis, CKD stage IV, and normocytic anemia presenting with increasing SHOB and weight gain, admitted for HFrEF exacerbation.  HFrEF Exacerbation CAD, Hx of sinus arrest s/p pacemaker Moderate MR, Severe TR Paroxysmal Atrial Fibrillation Had 2.7L UOP over past 24 hours, but still severely volume overloaded on exam. Nephrology following due to worsening kidney function and Advanced Heart Failure team following due to possible component of  cardiogenic shock. Started on dobutamine infusion to enhance diuresis. Lasix infusion increased to '30mg'$ /hr for more aggressive diuresis and will give another dose of metolazone '5mg'$  today. Advanced HF team with plan to likely perform right heart cath on Monday. Will need to stop anticoagulation one day prior to right heart cath to minimize bleeding risk. -lasix infusion increased to '30mg'$ /hr -metolazone '5mg'$  today -dobutamine infusion -coreg stopped due to suspected low output HF -appreciate nephrology and advanced HF recommendations -monitor UOP, strict I/O's, daily weights  -restarted diet as RHC, if needed, will be planned for Monday  AKI on CKD Stage IV Hyperkalemia Hyponatremia Likely a component of cardiorenal syndrome. As expected, kidney function worsening in setting of aggressive diuresis, but hopeful that there will be improvement once patient's volume overload resolves. Patient is a poor dialysis candidate due to multiple comorbidities. Sodium 131 this AM, likely reflecting inability to remove excess free water. -K 5.6 this AM, ordered lokelma 10g BID -trend renal function panel, avoid nephrotoxic medications  Prior to Admission Living Arrangement: Home Anticipated Discharge Location: TBD Barriers to Discharge: continued medical management Dispo: Anticipated discharge in approximately 3-4 day(s).   Virl Axe, MD 11/30/2020, 12:17 PM Pager: [phone] After 5pm on weekdays and 1pm on weekends: On Call pager [phone]

## 2020-11-30 NOTE — Progress Notes (Signed)
Patient alert and oriented x 4. Patient notified sister, Lenna Sciara.

## 2020-11-30 NOTE — Progress Notes (Signed)
RN inform this CN about pt's event. While doing assessment, DR arrived and took over. -Pt VS as follows BP (!) 133/44 (BP Location: Left Arm)   Pulse 72   Temp 97.8 F (36.6 C) (Oral)   Resp 18   Wt 69.4 kg   SpO2 100%   BMI 27.98 kg/m   -Pt alert and oriented -tender on the site but no open/ bruise/ hematoma -neuro intact.  Interventions: neuro checks, pain meds, VS frequent monitors.

## 2020-11-30 NOTE — Progress Notes (Signed)
Hypoglycemic Event  CBG: 67  Treatment: D50 25 mL (12.5 gm)   Symptoms: None  Patient was asymptomatic  Follow-up CBG: Time: 0839 CBG Result:112  Possible Reasons for Event: Other: NPO    Comments/MD notified: MD paged. Spoke with Dr. Henry Russel

## 2020-11-30 NOTE — Progress Notes (Addendum)
Patient ID: [name] [name], female   DOB: 1951/06/18, 70 y.o.   MRN: [id number] S: "I'm hungry, when can I eat".  Has had marked uop after starting dobutamine and lasix drip O:BP (!) 107/49 (BP Location: Right Leg)   Pulse 70   Temp 98.2 F (36.8 C) (Oral)   Resp 16   Wt 69.4 kg   SpO2 97%   BMI 27.98 kg/m   Intake/Output Summary (Last 24 hours) at 11/30/2020 1223 Last data filed at 11/30/2020 1209 Gross per 24 hour  Intake 1073.25 ml  Output 3500 ml  Net -2426.75 ml   Intake/Output: I/O last 3 completed shifts: In: 1083.6 [P.O.:1020; I.V.:63.6] Out: 2700 [Urine:1900; Other:800]  Intake/Output this shift:  Total I/O In: 469.6 [P.O.:118; I.V.:234.6; IV Piggyback:117] Out: 1200 [Urine:1200] Weight change:  Gen: NAD CVS: RRR Resp: decreased BS at bases Abd: distended Ext: 2+ anasarca to mid abdomen  Recent Labs  Lab 11/26/20 0233 11/27/20 0338 11/28/20 0347 11/28/20 1646 11/29/20 0407 11/29/20 1715 11/30/20 0624 11/30/20 0933  NA 138 135 134* 133* 130* 133* 131*  --   K 4.4 4.8 5.3* 5.5* 4.9 4.7 5.6*  --   CL 103 99 98 96* 95* 97* 95*  --   CO2 '26 25 25 26 24 22 '$ 21*  --   GLUCOSE 119* 75 73 103* 115* 117* 66*  --   BUN 62* 66* 75* 72* 73* 75* 87*  --   CREATININE 3.12* 3.09* 3.30* 3.33* 3.31* 3.48* 3.55*  --   ALBUMIN  --   --   --   --   --   --   --  2.7*  CALCIUM 8.7* 8.8* 8.9 8.9 8.8* 8.9 8.9  --   AST  --   --   --   --   --   --   --  37  ALT  --   --   --   --   --   --   --  24   Liver Function Tests: Recent Labs  Lab 11/30/20 0933  AST 37  ALT 24  ALKPHOS 92  BILITOT 1.5*  PROT 5.9*  ALBUMIN 2.7*   No results for input(s): LIPASE, AMYLASE in the last 168 hours. No results for input(s): AMMONIA in the last 168 hours. CBC: Recent Labs  Lab 11/25/20 0406 11/27/20 0338 11/28/20 0347 11/29/20 2256 11/30/20 0719  WBC 4.6 4.9 5.1 7.6 6.6  NEUTROABS  --   --  2.8  --   --   HGB 8.8* 8.5* 8.2* 8.1* 7.7*  HCT 27.9* 26.2* 24.7* 25.4* 23.6*   MCV 90.6 88.8 89.2 90.1 88.7  PLT 117* 84* 100* 107* 114*   Cardiac Enzymes: No results for input(s): CKTOTAL, CKMB, CKMBINDEX, TROPONINI in the last 168 hours. CBG: Recent Labs  Lab 11/30/20 0619 11/30/20 0754 11/30/20 0813 11/30/20 0839 11/30/20 1139  GLUCAP 72 72 67* 112* 71    Iron Studies:  Recent Labs    11/29/20 1715  IRON 44  TIBC 293  FERRITIN 135   Studies/Results: No results found. Marland Kitchen albuterol  2.5 mg Nebulization BID  . apixaban  5 mg Oral BID  . atorvastatin  40 mg Oral Daily  . calcitRIOL  0.5 mcg Oral Daily  . sodium zirconium cyclosilicate  10 g Oral BID    BMET    Component Value Date/Time   NA 131 (L) 11/30/2020 0624   NA 142 11/08/2020 1039   K 5.6 (H) 11/30/2020 [id number]  CL 95 (L) 11/30/2020 0624   CO2 21 (L) 11/30/2020 0624   GLUCOSE 66 (L) 11/30/2020 0624   BUN 87 (H) 11/30/2020 0624   BUN 21 11/08/2020 1039   CREATININE 3.55 (H) 11/30/2020 0624   CREATININE 2.26 (H) 04/07/2018 1330   CALCIUM 8.9 11/30/2020 0624   CALCIUM 9.3 08/28/2011 1648   GFRNONAA 14 (L) 11/30/2020 0624   GFRNONAA 29 (L) 11/10/2012 1115   GFRAA 28 (L) 11/08/2020 1039   GFRAA 33 (L) 11/10/2012 1115   CBC    Component Value Date/Time   WBC 6.6 11/30/2020 0719   RBC 2.66 (L) 11/30/2020 0719   HGB 7.7 (L) 11/30/2020 0719   HGB 11.7 11/25/2018 0931   HCT 23.6 (L) 11/30/2020 0719   HCT 35.8 11/25/2018 0931   PLT 114 (L) 11/30/2020 0719   PLT 146 (L) 11/25/2018 0931   MCV 88.7 11/30/2020 0719   MCV 93 11/25/2018 0931   MCH 28.9 11/30/2020 0719   MCHC 32.6 11/30/2020 0719   RDW 16.3 (H) 11/30/2020 0719   RDW 13.1 11/25/2018 0931   LYMPHSABS 1.2 11/28/2020 0347   LYMPHSABS 2.0 11/25/2018 0931   MONOABS 0.8 11/28/2020 0347   EOSABS 0.3 11/28/2020 0347   EOSABS 0.7 (H) 11/25/2018 0931   BASOSABS 0.0 11/28/2020 0347   BASOSABS 0.1 11/25/2018 0931     Assessment/Plan: 1. AKI/CKD stage IV in setting of decompensated systolic and diastolic CHF with  escalating doses of IV lasix. Likely cardiorenal syndrome.  1. Did not respond to increased IV lasix 120 mg q6 and IV albumin and started lasix drip on 11/29/20. 2. BUN/Cr up slightly, no uremic symptoms 3. She is a poor candidate for dialysis given her multiple co-morbidities (especially her cardiomyopathy) and poor functional and nutritional status. She is a full code and recommend Palliative care consult to help set goals/limits of care.  2. Acute on chronic systolic and diastolic CHF- profound volume overload. EF 30% with severe TR.  Unclear why her weight increased since her past hospitalization.  Appreciate input from HF team 1. Increase lasix drip and follow UOP and daily weights 2. Continue with dobutamine 3. Possible RHC on Monday 4. Off coreg 3. Complete heart block - s/p PPM, high percentage of RV pacing per Cardiology 4. Abdominal pain- she is markedly distended and has significant pain with minimal pressure. She has a history of gallstones and thickened gallbladder wall. Possible acute cholecystitis. Recommend CT scan of abdomen to further evaluate. 5. Thrombocytopenia - cont to follow 6. Severe protein malnutrition - alb 2.4 and will need protein supplementation. 7. Moderate MR and severe TR- as above recommend heart failure team consultation 8. Hyperkalemia- due to #1. improved with lokelma and increase in IV lasix dose 9. Hyponatremia - due to volume overload.   10. Anemia of CKD stage IV- cont to follow and transfuse prn.  Will give IV feraheme due to low TSAT.  Will start ESA.   Donetta Potts, MD [employer] [phone]

## 2020-11-30 NOTE — Progress Notes (Addendum)
Advanced Heart Failure Rounding Note  PCP-Cardiologist: No primary care provider on file.    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 Now admitted with worsening HF and AKI likely due to cardiorenal syndrome   Echo EF 30% with severe TR  Started on empiric DBA for suspected low output HF and cardiorenal syndrome + Lasix gtt.   Subjective:    On DBA 2.5. Started on lasix gtt at 20/hr yesterday.  She put out at least - 1.9 L in UOP yesterday + 2 unmeasured UOP occurences. Wt down 2 lb.   Scr continues to trend up, 3.31>>3.48>>3.55  K 5.6 (Lokelma given 10 g x 2)    Hgb 7.7     Objective:   Weight Range: 69.4 kg Body mass index is 27.98 kg/m.   Vital Signs:   Temp:  [97.3 F (36.3 C)-98.2 F (36.8 C)] 98.2 F (36.8 C) (03/04 0500) Pulse Rate:  [69-72] 70 (03/04 0500) Resp:  [18-20] 18 (03/04 0500) BP: (91-133)/(36-75) 105/51 (03/04 0500) SpO2:  [95 %-100 %] 95 % (03/04 0500) Weight:  [69.4 kg-70.7 kg] 69.4 kg (03/04 0137) Last BM Date: 11/29/20  Weight change: Filed Weights   11/28/20 0431 11/29/20 1454 11/30/20 0137  Weight: 68.4 kg 70.7 kg 69.4 kg    Intake/Output:   Intake/Output Summary (Last 24 hours) at 11/30/2020 1016 Last data filed at 11/30/2020 0830 Gross per 24 hour  Intake 603.63 ml  Output 2600 ml  Net -1996.37 ml      Physical Exam    General:  Chronically ill appearing. No resp difficulty HEENT: Normal Neck: Supple. JVP elevated . Carotids 2+ bilat; no bruits. No lymphadenopathy or thyromegaly appreciated. Cor: PMI nondisplaced. Regular rate & rhythm. No rubs, gallops or murmurs. Lungs: Clear Abdomen: Soft, nontender, nondistended. No hepatosplenomegaly. No bruits or masses. Good bowel sounds. Extremities: No cyanosis, clubbing, rash, + 2 bilateral LEE up to thighs, s/p left BKA  Neuro: Alert & orientedx3, cranial nerves grossly intact.  moves all 4 extremities w/o difficulty. Affect pleasant   Telemetry   A-paced 70s   EKG    No new EKG to review   Labs    CBC Recent Labs    11/28/20 0347 11/29/20 2256 11/30/20 0719  WBC 5.1 7.6 6.6  NEUTROABS 2.8  --   --   HGB 8.2* 8.1* 7.7*  HCT 24.7* 25.4* 23.6*  MCV 89.2 90.1 88.7  PLT 100* 107* [id number]*   Basic Metabolic Panel Recent Labs    11/29/20 1715 11/30/20 0624  NA 133* 131*  K 4.7 5.6*  CL 97* 95*  CO2 22 21*  GLUCOSE 117* 66*  BUN 75* 87*  CREATININE 3.48* 3.55*  CALCIUM 8.9 8.9   Liver Function Tests No results for input(s): AST, ALT, ALKPHOS, BILITOT, PROT, ALBUMIN in the last 72 hours. No results for input(s): LIPASE, AMYLASE in the last 72 hours. Cardiac Enzymes No results for input(s): CKTOTAL, CKMB, CKMBINDEX, TROPONINI in the last 72 hours.  BNP: BNP (last 3 results) Recent Labs    06/20/20 0956 11/21/20 1105  BNP >4,500.0* >4,500.0*    ProBNP (last 3 results) No results for input(s): PROBNP in the last 8760 hours.   D-Dimer No results for input(s): DDIMER in the last 72 hours. Hemoglobin A1C No results for input(s): HGBA1C in the last 72 hours. Fasting Lipid Panel Recent Labs  11/28/20 1646  CHOL 96  HDL 58  LDLCALC 32  TRIG 32  CHOLHDL 1.7   Thyroid Function Tests No results for input(s): TSH, T4TOTAL, T3FREE, THYROIDAB in the last 72 hours.  Invalid input(s): FREET3  Other results:   Imaging     No results found.   Medications:     Scheduled Medications: . albuterol  2.5 mg Nebulization BID  . atorvastatin  40 mg Oral Daily  . calcitRIOL  0.5 mcg Oral Daily  . metolazone  5 mg Oral Once  . Rivaroxaban  15 mg Oral Q supper  . sodium zirconium cyclosilicate  10 g Oral BID     Infusions: . DOBUTamine 2.5 mcg/kg/min (11/29/20 1711)  . furosemide (LASIX) 200 mg in dextrose 5% 100 mL ('2mg'$ /mL) infusion 20 mg/hr (11/30/20 0949)     PRN Medications:  acetaminophen **OR** acetaminophen,  hydrOXYzine, loperamide, oxyCODONE, polyethylene glycol    Assessment/Plan    1. A/C Systolic Heart Failure  - Etiology unclear possibly from RV pacing?  Has St Jude PPM - Myoview 08/2020  Showed EF 30% with no signs of ischemia. Unable to cath with CKD. No chest pain.  -ECHO this admit with EF 30-35% RV normal. Severe TR - Marked volume overload. Initial poor response to high dose intermittent IV lasix. Urine sodium 39.  - Started on empiric DBA 2.5 + Lasix gtt at 20/hr.  - Diuresing but suboptimal. Nephrology following. ? Increase lasix gtt to 30/hr.  - Continue DBA at 2.5  - if SCr continues to rise and/or difficulty diuresing, plan RHC (likely Monday, Xarelto given last PM)  - Off Coreg w/ suspected low output.   - No dig/spir/arni with elevated creatinine.   2.  CKD Stage IV - Creatinine baseline  2.9-3.3 - SCr up to 3.6 today  - Suspect Cardiorenal. Continue DBA + Lasix gtt  - Nephrology following   3. PAF - In NSR - On Xarelto - Given worsening renal function, ? Switch to Eliquis   4. 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   5. PAD -H/O LBKA   6. Hyperthyroidism  - Thyroid US 11/12/20 with numerous small nodules  -11/22/20 TSH  0.014 Free T3 1.3   7. Severe TR -Noted on ECHO   8. Hyperkalemia  - K 5.6 in the setting of renal failure - Received lokelma 10 g x 2 - Follow daily BMET   9. Anemia  - Hgb has been drifting down from 10 --->8.2->7.7  - Ferritin 135, Iron 44, Sats 15% - Give Feraheme   10. CAD  - mild CAD on cath 2005  - Myoview 2021 no signs of ischemia - On statin + xarelto  11. Social  Referred to SW for assistance with financial difficulties.      Length of Stay: [address], PA-C  11/30/2020, 10:16 AM  Advanced Heart Failure Team Pager [phone] (M-F; 7a - 4p)  Please contact [facility] for night-coverage after hours (4p -7a ) and  weekends on [email or web address]  Patient seen and examined with the above-signed Advanced Practice Provider and/or Housestaff. I personally reviewed laboratory data, imaging studies and relevant notes. I independently examined the patient and formulated the important aspects of the plan. I have edited the note to reflect any of my changes or salient points. I have personally discussed the plan with the patient and/or family.  Urine output picking  up with DBA and lasix gtt. Creatinine essentially stable. Feeling a bit better but still SOB and bloated. K high  General:  Sitting up in bed  No resp difficulty HEENT: normal Neck: supple. JVP to ear Carotids 2+ bilat; no bruits. No lymphadenopathy or thryomegaly appreciated. Cor: PMI nondisplaced. Regular rate & rhythm. 3/6 TR Lungs: clear Abdomen: soft, nontender, + distended. No hepatosplenomegaly. No bruits or masses. Good bowel sounds. Extremities: no cyanosis, clubbing, rash, 3+ edema L BKA Neuro: alert & orientedx3, cranial nerves grossly intact. moves all 4 extremities w/o difficulty. Affect pleasant  Remains massively volume overloaded. Seems to be responding to inotropic support. Continue DBA. Increase lasix gtt. Give Lokelma. Follow clinical course. Can consider RHC on Monday as needed.   Prognosis guarded.   If improves will need to consider PM upgrade to BiV and get closer look to see if RV lead is adherent to TV.   Glori Bickers, MD  1:33 PM

## 2020-11-30 NOTE — Progress Notes (Signed)
RN transferring patient to the bedside commode. IV pole tipped over and struck the patient on the top of her head. Neuro checks implemented. Equal hand grasps, PERRL.No goose egg noted, tenderness to the top of head. Pain 8/10 for head. Paged Dr. Lisabeth Devoid. Dr. Lisabeth Devoid came to bedside to examine the patient.

## 2020-11-30 NOTE — Progress Notes (Signed)
Occupational Therapy Treatment Patient Details Name: [name] [name] MRN: [id number] DOB: [birth year] Today's Date: 11/30/2020    History of present illness Pt is a 70 y/o female admitted secondary to CHF exacerbation. PMH includes s/p L BKA, CKD, DM, a fib, HTN, CHF, CAD, and s/p pacemaker.   OT comments  Pt making steady progress towards OT goals this session. Pt received seated on BSC needing MOD A for pericare in standing. Pt completed x2 stand pivot transfers from BSC>w/c>EOB needing up to MIN A for steadying assist. Pt completed full wash up at sink with up to MOD A for bathing tasks. Pt reports typically using shower bench at home with her aid. Pt would continue to benefit from skilled occupational therapy while admitted and after d/c to address the below listed limitations in order to improve overall functional mobility and facilitate independence with BADL participation. DC plan remains appropriate, will follow acutely per POC.     Follow Up Recommendations  Home health OT    Equipment Recommendations  Other (comment) ([place] AE for dressing/bathing)    Recommendations for Other Services      Precautions / Restrictions Precautions Precautions: Fall Precaution Comments: L BKA Restrictions Weight Bearing Restrictions: No       Mobility Bed Mobility               General bed mobility comments: pt recieved on BSC and returned to sitting EOB    Transfers Overall transfer level: Needs assistance Equipment used: 1 person hand held assist Transfers: Sit to/from [facility] Sit to Stand: Min assist Stand pivot transfers: Min assist       General transfer comment: . pt completed stand pivot transfer from BSC>w/c>EOB, pt sit<>stand x4 during bathing tasks and pericare with pt needing MIN A for steadying assist with dynamic reaching tasks    Balance Overall balance assessment: Needs assistance Sitting-balance support: Feet supported Sitting  balance-Leahy Scale: Good     Standing balance support: Single extremity supported;During functional activity Standing balance-Leahy Scale: Poor Standing balance comment: at least one UE supported during ADLS                           ADL either performed or assessed with clinical judgement   ADL Overall ADL's : Needs assistance/impaired     Grooming: Wash/dry face;Sitting;Supervision/safety   Upper Body Bathing: Moderate assistance;Sitting Upper Body Bathing Details (indicate cue type and reason): sitting at sink from w/c level, MOD A for cleanliness Lower Body Bathing: Moderate assistance;Sit to/from stand Lower Body Bathing Details (indicate cue type and reason): MOD A for balance while pt stood to clean periarea Upper Body Dressing : Set up;Sitting Upper Body Dressing Details (indicate cue type and reason): to don new gown Lower Body Dressing: Total assistance Lower Body Dressing Details (indicate cue type and reason): to don new sock on R LE Toilet Transfer: Minimal assistance;Stand-pivot Toilet Transfer Details (indicate cue type and reason): MIN A for balance for stand pivot transfer from BSC>w/c Toileting- Clothing Manipulation and Hygiene: Moderate assistance;Sit to/from stand Toileting - Clothing Manipulation Details (indicate cue type and reason): MOD A for standing balance for anterior pericare   Tub/Shower Transfer Details (indicate cue type and reason): pt reports bench seat at home with pts aid assisitng with transfers Functional mobility during ADLs: Wheelchair;Min guard General ADL Comments: pt needing assist for functional transfers ( MIN A) however at baseline pt reports not needing assist for functional transfers. Pt  completing w/c mobility within room with min guard d/t line mgmt. pt completed full wash up at sink needing up to MOD A for UB/LB bathing from w/c level     Vision       Perception     Praxis      Cognition Arousal/Alertness:  Awake/alert Behavior During Therapy: WFL for tasks assessed/performed Overall Cognitive Status: Within Functional Limits for tasks assessed                                          Exercises     Shoulder Instructions       General Comments VSS    Pertinent Vitals/ Pain       Pain Assessment: No/denies pain  Home Living                                          Prior Functioning/Environment              Frequency  Min 2X/week        Progress Toward Goals  OT Goals(current goals can now be found in the care plan section)  Progress towards OT goals: Progressing toward goals  Acute Rehab OT Goals Patient Stated Goal: to go home and eat OT Goal Formulation: With patient Time For Goal Achievement: 12/12/20 Potential to Achieve Goals: Good  Plan Discharge plan remains appropriate;Frequency remains appropriate    Co-evaluation                 AM-PAC OT "6 Clicks" Daily Activity     Outcome Measure   Help from another person eating meals?: None Help from another person taking care of personal grooming?: A Little Help from another person toileting, which includes using toliet, bedpan, or urinal?: A Lot Help from another person bathing (including washing, rinsing, drying)?: A Little Help from another person to put on and taking off regular upper body clothing?: None Help from another person to put on and taking off regular lower body clothing?: A Lot 6 Click Score: 18    End of Session Equipment Utilized During Treatment: Gait belt;Other (comment) (w/c)  OT Visit Diagnosis: Other abnormalities of gait and mobility (R26.89);Muscle weakness (generalized) (M62.81)   Activity Tolerance Patient tolerated treatment well   Patient Left in bed;with call bell/phone within reach;with bed alarm set;Other (comment) (sitting EOB; RN aware)   Nurse Communication Mobility status;Other (comment) (wants to know why she is NPO)         Time: [id number] OT Time Calculation (min): 34 min  Charges: OT General Charges $OT Visit: 1 Visit OT Treatments $Self Care/Home Management : 23-37 mins  Harley Alto., COTA/L Acute Rehabilitation Services [place] 11/30/2020, 12:16 PM

## 2020-11-30 NOTE — Progress Notes (Signed)
Orthopedic Tech Progress Note Patient Details:  [name] [name] 1951-04-01 [id number]  Ortho Devices Type of Ortho Device: Louretta Parma boot Ortho Device/Splint Location: RLE Ortho Device/Splint Interventions: Ordered,Application   Post Interventions Patient Tolerated: Well Instructions Provided: [facility] [place] 11/30/2020, 3:51 PM

## 2020-11-30 NOTE — Progress Notes (Signed)
Neuro checks remain intact. Tenderness noted on top of head. No hematoma /bruise /or open area noted on assessment. Ice pack applied. Patient resting comfortably in bed.

## 2020-12-01 DIAGNOSIS — I130 Hypertensive heart and chronic kidney disease with heart failure and stage 1 through stage 4 chronic kidney disease, or unspecified chronic kidney disease: Secondary | ICD-10-CM | POA: Diagnosis not present

## 2020-12-01 DIAGNOSIS — I129 Hypertensive chronic kidney disease with stage 1 through stage 4 chronic kidney disease, or unspecified chronic kidney disease: Secondary | ICD-10-CM | POA: Diagnosis not present

## 2020-12-01 DIAGNOSIS — N184 Chronic kidney disease, stage 4 (severe): Secondary | ICD-10-CM

## 2020-12-01 DIAGNOSIS — I5023 Acute on chronic systolic (congestive) heart failure: Secondary | ICD-10-CM | POA: Diagnosis not present

## 2020-12-01 DIAGNOSIS — N179 Acute kidney failure, unspecified: Secondary | ICD-10-CM | POA: Diagnosis not present

## 2020-12-01 LAB — RENAL FUNCTION PANEL
Albumin: 2.6 g/dL — ABNORMAL LOW (ref 3.5–5.0)
Anion gap: 14 (ref 5–15)
BUN: 83 mg/dL — ABNORMAL HIGH (ref 8–23)
CO2: 24 mmol/L (ref 22–32)
Calcium: 9.3 mg/dL (ref 8.9–10.3)
Chloride: 94 mmol/L — ABNORMAL LOW (ref 98–111)
Creatinine, Ser: 3.63 mg/dL — ABNORMAL HIGH (ref 0.44–1.00)
GFR, Estimated: 13 mL/min — ABNORMAL LOW (ref >60)
Glucose, Bld: 93 mg/dL (ref 70–99)
Phosphorus: 7.2 mg/dL — ABNORMAL HIGH (ref 2.5–4.6)
Potassium: 4.7 mmol/L (ref 3.5–5.1)
Sodium: 132 mmol/L — ABNORMAL LOW (ref 135–145)

## 2020-12-01 LAB — CBC
HCT: 23.9 % — ABNORMAL LOW (ref 36.0–46.0)
Hemoglobin: 7.8 g/dL — ABNORMAL LOW (ref 12.0–15.0)
MCH: 28.7 pg (ref 26.0–34.0)
MCHC: 32.6 g/dL (ref 30.0–36.0)
MCV: 87.9 fL (ref 80.0–100.0)
Platelets: 122 10*3/uL — ABNORMAL LOW (ref 150–400)
RBC: 2.72 MIL/uL — ABNORMAL LOW (ref 3.87–5.11)
RDW: 16.7 % — ABNORMAL HIGH (ref 11.5–15.5)
WBC: 6.2 10*3/uL (ref 4.0–10.5)
nRBC: 0 % (ref 0.0–0.2)

## 2020-12-01 MED ORDER — ACETAZOLAMIDE 250 MG PO TABS
250.0000 mg | ORAL_TABLET | Freq: Two times a day (BID) | ORAL | Status: DC
  Administered 2020-12-01 – 2020-12-03 (×4): 250 mg via ORAL
  Filled 2020-12-01 (×5): qty 1

## 2020-12-01 MED ORDER — SEVELAMER CARBONATE 800 MG PO TABS
1600.0000 mg | ORAL_TABLET | Freq: Once | ORAL | Status: AC
  Administered 2020-12-01: 1600 mg via ORAL
  Filled 2020-12-01: qty 2

## 2020-12-01 MED ORDER — APIXABAN 5 MG PO TABS
5.0000 mg | ORAL_TABLET | Freq: Two times a day (BID) | ORAL | Status: AC
  Administered 2020-12-01 – 2020-12-02 (×3): 5 mg via ORAL
  Filled 2020-12-01 (×3): qty 1

## 2020-12-01 MED ORDER — METOLAZONE 5 MG PO TABS
10.0000 mg | ORAL_TABLET | Freq: Once | ORAL | Status: AC
  Administered 2020-12-01: 10 mg via ORAL
  Filled 2020-12-01: qty 2

## 2020-12-01 NOTE — Progress Notes (Deleted)
   Subjective:   No acute overnight events.  Reports feeling fine. Discussed plan to continue taking fluid off, which she agrees with. No questions or concerns.  Objective:  Vital signs in last 24 hours: Vitals:   11/30/20 1924 11/30/20 2054 12/01/20 0001 12/01/20 0355  BP: (!) 142/59   (!) 162/43  Pulse: 70   69  Resp: 17   17  Temp: (!) 97.5 F (36.4 C)   98.2 F (36.8 C)  TempSrc: Oral   Oral  SpO2: 100% 96%  100%  Weight:   67.5 kg    Physical Exam: General: ill-appearing elderly female, lying in bed, NAD CV: ventricle-paced rate, regular rhythm. Abdomen: distended, mild diffuse tenderness to palpation, abdominal wall edema down to sacrum. MSK: left BKA, UNNA boot in place on RLE, b/l LE edema. Skin: warm and dry. Neuro: AAOx3, no focal deficits noted.  Assessment/Plan:  Principal Problem:   Acute exacerbation of CHF (congestive heart failure) (HCC) Active Problems:   Complete heart block (HCC)   CKD stage 4 secondary to hypertension ([place])   Diabetes mellitus (HCC)   Paroxysmal atrial fibrillation (HCC)  [name] [name] is a 70 year old female with PMH of CAD w/sinus arrest now s/p pacemaker, HFrEF (EF 30-35%), paroxysmal atrial fibrillation on eliquis, CKD stage IV, and normocytic anemia presenting with increasing SHOB and weight gain, admitted for HFrEF exacerbation.  HFrEF Exacerbation CAD, Hx of sinus arrest s/p pacemaker Moderate MR, Severe TR Paroxysmal Atrial Fibrillation Had only 1.7L UOP over past 24 hours despite aggressive diuresis with lasix infusion '@30cc'$ /hr, metolazone '5mg'$ , and dobutamine infusion. Still severely volume overloaded on exam. Nephrology and Advanced HF team following due to worsening kidney function and possible component of cardiogenic shock. Possible plan for right heart cath on Monday by Advanced HF team, so will need to stop anticoagulation tomorrow to minimize bleeding risk. -greatly appreciate nephrology and advanced HF  recommendations -lasix infusion '@30cc'$ /hr -dobutamine infusion -dose of metolazone '10mg'$  today -closely monitor UOP, strict I/O's, daily weights   AKI on CKD Stage IV Hyperkalemia Hyponatremia Hyperphosphatemia Likely component of cardiorenal syndrome. Kidney function continuing to worsen in the setting of aggressive diuresis, but hopeful there will be improvement once patient's volume overload begins to resolve. She is a poor candidate for dialysis due to multiple comorbidities. -sodium 132 >> reflecting inability to remove excess free water -potassium 4.7 this AM after two doses of lokelma yesterday -phosphate 7.2 this AM >> added sevelamer '1600mg'$  once -trend renal function panel, avoid nephrotoxic medications   Prior to Admission Living Arrangement: Home Anticipated Discharge Location: TBD Barriers to Discharge: continued medical management Dispo: Anticipated discharge in approximately 3-4 day(s).   Virl Axe, MD 12/01/2020, 6:22 AM Pager: [phone] After 5pm on weekdays and 1pm on weekends: On Call pager [phone]

## 2020-12-01 NOTE — Progress Notes (Signed)
Patient ID: Kyrielle Drennen, female   DOB: 10/07/1950, 70 y.o.   MRN: [id number] S: Complaining of going to the bathroom all the time. O:BP (!) 141/50 (BP Location: Right Wrist)   Pulse 70   Temp 98.2 F (36.8 C) (Oral)   Resp 17   Wt 67.5 kg   SpO2 100%   BMI 27.23 kg/m   Intake/Output Summary (Last 24 hours) at 12/01/2020 1147 Last data filed at 12/01/2020 1030 Gross per 24 hour  Intake 1718.85 ml  Output 3000 ml  Net -1281.15 ml   Intake/Output: I/O last 3 completed shifts: In: 1598.9 [P.O.:1194; I.V.:287.9; IV Piggyback:117] Out: 4000 [Urine:3700; Stool:300]  Intake/Output this shift:  Total I/O In: 240 [P.O.:240] Out: 800 [Urine:800] Weight change: -3.13 kg Gen: NAD CVS: RRR no rub Resp: decreased BS at bases Abd: distended, + tenderness Ext: 2+ anasarca to mid chest  Recent Labs  Lab 11/27/20 0338 11/28/20 0347 11/28/20 1646 11/29/20 0407 11/29/20 1715 11/30/20 0624 11/30/20 0933 12/01/20 0420  NA 135 134* 133* 130* 133* 131*  --  132*  K 4.8 5.3* 5.5* 4.9 4.7 5.6*  --  4.7  CL 99 98 96* 95* 97* 95*  --  94*  CO2 '25 25 26 24 22 '$ 21*  --  24  GLUCOSE 75 73 103* 115* 117* 66*  --  93  BUN 66* 75* 72* 73* 75* 87*  --  83*  CREATININE 3.09* 3.30* 3.33* 3.31* 3.48* 3.55*  --  3.63*  ALBUMIN  --   --   --   --   --   --  2.7* 2.6*  CALCIUM 8.8* 8.9 8.9 8.8* 8.9 8.9  --  9.3  PHOS  --   --   --   --   --   --   --  7.2*  AST  --   --   --   --   --   --  37  --   ALT  --   --   --   --   --   --  24  --    Liver Function Tests: Recent Labs  Lab 11/30/20 0933 12/01/20 0420  AST 37  --   ALT 24  --   ALKPHOS 92  --   BILITOT 1.5*  --   PROT 5.9*  --   ALBUMIN 2.7* 2.6*   No results for input(s): LIPASE, AMYLASE in the last 168 hours. No results for input(s): AMMONIA in the last 168 hours. CBC: Recent Labs  Lab 11/27/20 0338 11/28/20 0347 11/29/20 2256 11/30/20 0719 12/01/20 0420  WBC 4.9 5.1 7.6 6.6 6.2  NEUTROABS  --  2.8  --   --   --   HGB  8.5* 8.2* 8.1* 7.7* 7.8*  HCT 26.2* 24.7* 25.4* 23.6* 23.9*  MCV 88.8 89.2 90.1 88.7 87.9  PLT 84* 100* 107* 114* 122*   Cardiac Enzymes: No results for input(s): CKTOTAL, CKMB, CKMBINDEX, TROPONINI in the last 168 hours. CBG: Recent Labs  Lab 11/30/20 0619 11/30/20 0754 11/30/20 0813 11/30/20 0839 11/30/20 1139  GLUCAP 72 72 67* 112* 71    Iron Studies:  Recent Labs    11/29/20 1715  IRON 44  TIBC 293  FERRITIN 135   Studies/Results: No results found. Marland Kitchen albuterol  2.5 mg Nebulization BID  . apixaban  5 mg Oral BID  . atorvastatin  40 mg Oral Daily  . calcitRIOL  0.5 mcg Oral Daily  . darbepoetin (ARANESP)  injection - NON-DIALYSIS  25 mcg Subcutaneous Q Fri-1800    BMET    Component Value Date/Time   NA 132 (L) 12/01/2020 0420   NA 142 11/08/2020 1039   K 4.7 12/01/2020 0420   CL 94 (L) 12/01/2020 0420   CO2 24 12/01/2020 0420   GLUCOSE 93 12/01/2020 0420   BUN 83 (H) 12/01/2020 0420   BUN 21 11/08/2020 1039   CREATININE 3.63 (H) 12/01/2020 0420   CREATININE 2.26 (H) 04/07/2018 1330   CALCIUM 9.3 12/01/2020 0420   CALCIUM 9.3 08/28/2011 1648   GFRNONAA 13 (L) 12/01/2020 0420   GFRNONAA 29 (L) 11/10/2012 1115   GFRAA 28 (L) 11/08/2020 1039   GFRAA 33 (L) 11/10/2012 1115   CBC    Component Value Date/Time   WBC 6.2 12/01/2020 0420   RBC 2.72 (L) 12/01/2020 0420   HGB 7.8 (L) 12/01/2020 0420   HGB 11.7 11/25/2018 0931   HCT 23.9 (L) 12/01/2020 0420   HCT 35.8 11/25/2018 0931   PLT 122 (L) 12/01/2020 0420   PLT 146 (L) 11/25/2018 0931   MCV 87.9 12/01/2020 0420   MCV 93 11/25/2018 0931   MCH 28.7 12/01/2020 0420   MCHC 32.6 12/01/2020 0420   RDW 16.7 (H) 12/01/2020 0420   RDW 13.1 11/25/2018 0931   LYMPHSABS 1.2 11/28/2020 0347   LYMPHSABS 2.0 11/25/2018 0931   MONOABS 0.8 11/28/2020 0347   EOSABS 0.3 11/28/2020 0347   EOSABS 0.7 (H) 11/25/2018 0931   BASOSABS 0.0 11/28/2020 0347   BASOSABS 0.1 11/25/2018 0931      Assessment/Plan: 1. AKI/CKD stage IV in setting of decompensated systolic and diastolic CHF with escalating doses of IV lasix. Likely cardiorenal syndrome. 1. Did not respond toincreasedIV lasix 120 mg q6 andIV albumin and started lasix drip on 11/29/20 and dobutamine with marked increase in UOP. 2. BUN/Cr up slightly, no uremic symptoms 3. She is a poor candidate for dialysis given her multiple co-morbidities (especially her cardiomyopathy) and poor functional and nutritional status. She is a full code and recommend Palliative care consult to help set goals/limits of care.  2. Acute on chronic systolic and diastolic CHF- profound volume overload. EF 30% with severe TR.  Unclear why her weight increased since her past hospitalization.  Appreciate input from HF team 1. Increase lasix drip and follow UOP and daily weights 2. Continue with dobutamine 3. Possible RHC on Monday 4. Off coreg 5. Agree with increasing metolazone and follow. 3. Complete heart block - s/p PPM, high percentage of RV pacing per Cardiology 4. Abdominal pain- she is markedly distended and has significant pain with minimal pressure. She has a history of gallstones and thickened gallbladder wall. Possible acute cholecystitis. Recommend CT scan of abdomen to further evaluate. 5. Thrombocytopenia - cont to follow 6. Severe protein malnutrition - alb 2.4 and will need protein supplementation. 7. Moderate MR and severe TR- as above recommend heart failure team consultation 8. Hyperkalemia- due to #1.improved with lokelma andincrease in IV lasix dose 9. Hyponatremia - due to volume overload. 10. Anemia of CKD stage IV- cont to follow and transfuse prn.Will give IV feraheme due to low TSAT.  Will start ESA.   Donetta Potts, MD [employer] [phone]

## 2020-12-01 NOTE — Progress Notes (Addendum)
   Subjective:   No acute overnight events.  Reports feeling fine. Discussed plan to continue taking fluid off, which she agrees with. No questions or concerns.  Objective:  Vital signs in last 24 hours: Vitals:   11/30/20 1924 11/30/20 2054 12/01/20 0001 12/01/20 0355  BP: (!) 142/59   (!) 162/43  Pulse: 70   69  Resp: 17   17  Temp: (!) 97.5 F (36.4 C)   98.2 F (36.8 C)  TempSrc: Oral   Oral  SpO2: 100% 96%  100%  Weight:   67.5 kg     Physical Exam: General: ill-appearing elderly female, lying in bed, NAD CV: ventricle-paced rate, regular rhythm. Abdomen: distended, mild diffuse tenderness to palpation, abdominal wall edema down to sacrum. MSK: left BKA, UNNA boot in place on RLE, b/l LE edema. Skin: warm and dry. Neuro: AAOx3, no focal deficits noted.  Assessment/Plan:  Principal Problem:   Acute exacerbation of CHF (congestive heart failure) (HCC) Active Problems:   Complete heart block (HCC)   CKD stage 4 secondary to hypertension ([place])   Diabetes mellitus (HCC)   Paroxysmal atrial fibrillation (HCC)  [name] [name] is a 70 year old female with PMH of CAD w/sinus arrest now s/p pacemaker, HFrEF (EF 30-35%), paroxysmal atrial fibrillation on eliquis, CKD stage IV, and normocytic anemia presenting with increasing SHOB and weight gain, admitted for HFrEF exacerbation.  HFrEF Exacerbation CAD, Hx of sinus arrest s/p pacemaker Moderate MR, Severe TR Paroxysmal Atrial Fibrillation Had only 1.7L UOP over past 24 hours despite aggressive diuresis with lasix infusion '@30cc'$ /hr, metolazone '5mg'$ , and dobutamine infusion. Still severely volume overloaded on exam. Nephrology and Advanced HF team following due to worsening kidney function and possible component of cardiogenic shock. Possible plan for right heart cath on Monday by Advanced HF team. Spoke with HF team, will give eliquis through tomorrow morning but hold tomorrow night and Monday morning doses. -greatly appreciate  nephrology and advanced HF recommendations -lasix infusion '@30cc'$ /hr -dobutamine infusion -dose of metolazone '10mg'$  today -closely monitor UOP, strict I/O's, daily weights   AKI on CKD Stage IV Hyperkalemia Hyponatremia Hyperphosphatemia Likely component of cardiorenal syndrome. Kidney function continuing to worsen in the setting of aggressive diuresis, but hopeful there will be improvement once patient's volume overload begins to resolve. She is a poor candidate for dialysis due to multiple comorbidities. -sodium 132 >> reflecting inability to remove excess free water -potassium 4.7 this AM after two doses of lokelma yesterday -phosphate 7.2 this AM >> added sevelamer '1600mg'$  once -trend renal function panel, avoid nephrotoxic medications   Prior to Admission Living Arrangement: Home Anticipated Discharge Location: TBD Barriers to Discharge: continued medical management Dispo: Anticipated discharge in approximately 3-4 day(s).   Virl Axe, MD 12/01/2020, 7:36 AM Pager: [phone] After 5pm on weekdays and 1pm on weekends: On Call pager [phone]

## 2020-12-01 NOTE — Progress Notes (Signed)
Advanced Heart Failure Rounding Note  PCP-Cardiologist: No primary care provider on file.    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 Now admitted with worsening HF and AKI likely due to cardiorenal syndrome   Echo EF 30% with severe TR  Started on empiric DBA for suspected low output HF and cardiorenal syndrome + Lasix gtt.   Subjective:    On DBA 2.5. lasix at 30 and metolazone 5. Urine output picking up but still massively volume overloaded  Denies CP, SOB, orthopnea or PND. Feels tight.   Creatinine stuck at 3.5-3.6   Objective:   Weight Range: 67.5 kg Body mass index is 27.23 kg/m.   Vital Signs:   Temp:  [97.5 F (36.4 C)-98.2 F (36.8 C)] 98.2 F (36.8 C) (03/05 0355) Pulse Rate:  [69-70] 70 (03/05 1026) Resp:  [17] 17 (03/05 0355) BP: (141-162)/(43-59) 141/50 (03/05 1026) SpO2:  [96 %-100 %] 100 % (03/05 1026) Weight:  [67.5 kg] 67.5 kg (03/05 0001) Last BM Date: 11/30/20  Weight change: Filed Weights   11/29/20 1454 11/30/20 0137 12/01/20 0001  Weight: 70.7 kg 69.4 kg 67.5 kg    Intake/Output:   Intake/Output Summary (Last 24 hours) at 12/01/2020 1332 Last data filed at 12/01/2020 1300 Gross per 24 hour  Intake 1489.23 ml  Output 2100 ml  Net -610.77 ml      Physical Exam    General:  Chronically ill appearing. No resp difficulty HEENT: normal Neck: supple.JVP to ear. Carotids 2+ bilat; no bruits. No lymphadenopathy or thryomegaly appreciated. Cor: PMI nondisplaced. Regular rate & rhythm. 3/6 TR Lungs: clear Abdomen: soft, nontender, ++ distended. No hepatosplenomegaly. No bruits or masses. Good bowel sounds. Extremities: no cyanosis, clubbing, rash, 3-4+ edema into flanks L BKA Neuro: alert & orientedx3, cranial nerves grossly intact. moves all 4 extremities w/o difficulty. Affect pleasant   Telemetry   A-paced 70s Personally  reviewed   Labs    CBC Recent Labs    11/30/20 0719 12/01/20 0420  WBC 6.6 6.2  HGB 7.7* 7.8*  HCT 23.6* 23.9*  MCV 88.7 87.9  PLT 114* 123XX123*   Basic Metabolic Panel Recent Labs    11/30/20 0624 11/30/20 0933 12/01/20 0420  NA 131*  --  132*  K 5.6*  --  4.7  CL 95*  --  94*  CO2 21*  --  24  GLUCOSE 66*  --  93  BUN 87*  --  83*  CREATININE 3.55*  --  3.63*  CALCIUM 8.9  --  9.3  MG  --  2.2  --   PHOS  --   --  7.2*   Liver Function Tests Recent Labs    11/30/20 0933 12/01/20 0420  AST 37  --   ALT 24  --   ALKPHOS 92  --   BILITOT 1.5*  --   PROT 5.9*  --   ALBUMIN 2.7* 2.6*   No results for input(s): LIPASE, AMYLASE in the last 72 hours. Cardiac Enzymes No results for input(s): CKTOTAL, CKMB, CKMBINDEX, TROPONINI in the last 72 hours.  BNP: BNP (last 3 results) Recent Labs    06/20/20 0956 11/21/20 1105  BNP >4,500.0* >4,500.0*    ProBNP (last 3 results) No results for input(s): PROBNP in the last 8760 hours.   D-Dimer No results for input(s): DDIMER in the last 72 hours. Hemoglobin  A1C No results for input(s): HGBA1C in the last 72 hours. Fasting Lipid Panel Recent Labs    11/28/20 1646  CHOL 96  HDL 58  LDLCALC 32  TRIG 32  CHOLHDL 1.7   Thyroid Function Tests No results for input(s): TSH, T4TOTAL, T3FREE, THYROIDAB in the last 72 hours.  Invalid input(s): FREET3  Other results:   Imaging    No results found.   Medications:     Scheduled Medications: . albuterol  2.5 mg Nebulization BID  . apixaban  5 mg Oral BID  . atorvastatin  40 mg Oral Daily  . calcitRIOL  0.5 mcg Oral Daily  . darbepoetin (ARANESP) injection - NON-DIALYSIS  25 mcg Subcutaneous Q Fri-1800    Infusions: . DOBUTamine 2.5 mcg/kg/min (11/29/20 1711)  . furosemide (LASIX) 200 mg in dextrose 5% 100 mL ('2mg'$ /mL) infusion 30 mg/hr (12/01/20 1234)    PRN Medications: acetaminophen **OR** acetaminophen, hydrOXYzine, loperamide, oxyCODONE,  polyethylene glycol    Assessment/Plan    1. A/C Systolic Heart Failure - Etiology unclear possibly from RV pacing?  Has St Jude PPM - Myoview 08/2020  Showed EF 30% with no signs of ischemia. Unable to cath with CKD. No chest pain.  -ECHO this admit with EF 30-35% RV normal. Severe TR - Marked volume overload. Initial poor response to high dose intermittent IV lasix. Urine sodium 39.  - Now on DBA 2.5, lasix 30 and metolazone. Urine output has picked up some but still modest.  Massively volume overloaded. Creatinine not improving - Agree with increasing metolazone - Will add diamox. Increase DBA to 4 - I think she is end-stage. Likely RHC on Monday - Off Coreg w/ suspected low output.   - No dig/spir/arni with elevated creatinine.  - If improves will need to consider PM upgrade to BiV (to avoid RV pacing) and get closer look to see if RV lead is adherent to TV.    2.  CKD Stage IV - Creatinine baseline  2.9-3.3 - SCr stable 3.5 - 3.6 range - Suspect Cardiorenal. Continue DBA + Lasix gtt  - Nephrology following. Not candidate for HD  3. PAF - In NSR - On Xarelto  4. 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   5. PAD -H/O LBKA   6. Hyperthyroidism  - Thyroid US 11/12/20 with numerous small nodules  -11/22/20 TSH  0.014 Free T3 1.3   7. Severe TR -Noted on ECHO   8. Hyperkalemia  - K 5.6 in the setting of renal failure - Received lokelma 10 g x 2 - K 4.7  9. Anemia  - Hgb has been drifting down from 10 --->8.2->7.7  - Ferritin 135, Iron 44, Sats 15% - Give Feraheme   10. CAD  - mild CAD on cath 2005  - Myoview 2021 no signs of ischemia - On statin + xarelto  11. Social  Referred to SW for assistance with financial difficulties.     Length of Stay: Mona, MD  12/01/2020, 1:32 PM  Advanced Heart Failure Team Pager [phone] (M-F; [place])  Please contact [facility] for night-coverage after hours (4p -7a ) and weekends on [email or web address]

## 2020-12-01 NOTE — Plan of Care (Signed)
  Problem: Pain Managment: ?Goal: General experience of comfort will improve ?Outcome: Completed/Met ?  ?Problem: Nutrition: ?Goal: Adequate nutrition will be maintained ?Outcome: Completed/Met ?  ?

## 2020-12-02 DIAGNOSIS — N184 Chronic kidney disease, stage 4 (severe): Secondary | ICD-10-CM | POA: Diagnosis not present

## 2020-12-02 DIAGNOSIS — I130 Hypertensive heart and chronic kidney disease with heart failure and stage 1 through stage 4 chronic kidney disease, or unspecified chronic kidney disease: Secondary | ICD-10-CM | POA: Diagnosis not present

## 2020-12-02 DIAGNOSIS — I5043 Acute on chronic combined systolic (congestive) and diastolic (congestive) heart failure: Secondary | ICD-10-CM

## 2020-12-02 DIAGNOSIS — I480 Paroxysmal atrial fibrillation: Secondary | ICD-10-CM

## 2020-12-02 DIAGNOSIS — I129 Hypertensive chronic kidney disease with stage 1 through stage 4 chronic kidney disease, or unspecified chronic kidney disease: Secondary | ICD-10-CM | POA: Diagnosis not present

## 2020-12-02 DIAGNOSIS — E119 Type 2 diabetes mellitus without complications: Secondary | ICD-10-CM | POA: Diagnosis not present

## 2020-12-02 DIAGNOSIS — I5023 Acute on chronic systolic (congestive) heart failure: Secondary | ICD-10-CM | POA: Diagnosis not present

## 2020-12-02 LAB — RENAL FUNCTION PANEL
Albumin: 2.7 g/dL — ABNORMAL LOW (ref 3.5–5.0)
Anion gap: 15 (ref 5–15)
BUN: 85 mg/dL — ABNORMAL HIGH (ref 8–23)
CO2: 25 mmol/L (ref 22–32)
Calcium: 9.2 mg/dL (ref 8.9–10.3)
Chloride: 88 mmol/L — ABNORMAL LOW (ref 98–111)
Creatinine, Ser: 3.56 mg/dL — ABNORMAL HIGH (ref 0.44–1.00)
GFR, Estimated: 13 mL/min — ABNORMAL LOW (ref >60)
Glucose, Bld: 100 mg/dL — ABNORMAL HIGH (ref 70–99)
Phosphorus: 7.1 mg/dL — ABNORMAL HIGH (ref 2.5–4.6)
Potassium: 4.3 mmol/L (ref 3.5–5.1)
Sodium: 128 mmol/L — ABNORMAL LOW (ref 135–145)

## 2020-12-02 MED ORDER — ALBUTEROL SULFATE (2.5 MG/3ML) 0.083% IN NEBU: 2.5000 mg | INHALATION_SOLUTION | RESPIRATORY_TRACT | Status: DC | PRN

## 2020-12-02 MED ORDER — SEVELAMER CARBONATE 800 MG PO TABS
1600.0000 mg | ORAL_TABLET | Freq: Three times a day (TID) | ORAL | Status: DC
  Administered 2020-12-02 – 2020-12-08 (×15): 1600 mg via ORAL
  Filled 2020-12-02 (×16): qty 2

## 2020-12-02 MED ORDER — METOLAZONE 5 MG PO TABS
5.0000 mg | ORAL_TABLET | Freq: Two times a day (BID) | ORAL | Status: DC
  Administered 2020-12-02 – 2020-12-03 (×3): 5 mg via ORAL
  Filled 2020-12-02 (×3): qty 1

## 2020-12-02 MED ORDER — SEVELAMER CARBONATE 800 MG PO TABS
1600.0000 mg | ORAL_TABLET | Freq: Once | ORAL | Status: AC
  Administered 2020-12-02: 1600 mg via ORAL
  Filled 2020-12-02: qty 2

## 2020-12-02 NOTE — Progress Notes (Signed)
Patient ID: [name] [name], female   DOB: 05-01-1951, 70 y.o.   MRN: [id number] S: No new complaints.  Increased diuresis overnight. O:BP (!) 132/44 (BP Location: Right Arm)   Pulse 69   Temp (!) 97.5 F (36.4 C) (Oral)   Resp 18   Wt 60.7 kg   SpO2 100%   BMI 24.47 kg/m   Intake/Output Summary (Last 24 hours) at 12/02/2020 1126 Last data filed at 12/02/2020 1040 Gross per 24 hour  Intake 1504.74 ml  Output 5152 ml  Net -3647.26 ml   Intake/Output: I/O last 3 completed shifts: In: 2424.7 [P.O.:1900; I.V.:524.7] Out: 5652 [Urine:5450; Stool:202]  Intake/Output this shift:  Total I/O In: -  Out: 1000 [Urine:1000] Weight change: -6.849 kg Gen: NAD CVS: RRR Resp: CTA Abd: distended, +BS, mildly tender Ext: 2+ anasarca to mid chest  Recent Labs  Lab 11/28/20 0347 11/28/20 1646 11/29/20 0407 11/29/20 1715 11/30/20 0624 11/30/20 0933 12/01/20 0420 12/02/20 0308  NA 134* 133* 130* 133* 131*  --  132* 128*  K 5.3* 5.5* 4.9 4.7 5.6*  --  4.7 4.3  CL 98 96* 95* 97* 95*  --  94* 88*  CO2 '25 26 24 22 '$ 21*  --  24 25  GLUCOSE 73 103* 115* 117* 66*  --  93 100*  BUN 75* 72* 73* 75* 87*  --  83* 85*  CREATININE 3.30* 3.33* 3.31* 3.48* 3.55*  --  3.63* 3.56*  ALBUMIN  --   --   --   --   --  2.7* 2.6* 2.7*  CALCIUM 8.9 8.9 8.8* 8.9 8.9  --  9.3 9.2  PHOS  --   --   --   --   --   --  7.2* 7.1*  AST  --   --   --   --   --  37  --   --   ALT  --   --   --   --   --  24  --   --    Liver Function Tests: Recent Labs  Lab 11/30/20 0933 12/01/20 0420 12/02/20 0308  AST 37  --   --   ALT 24  --   --   ALKPHOS 92  --   --   BILITOT 1.5*  --   --   PROT 5.9*  --   --   ALBUMIN 2.7* 2.6* 2.7*   No results for input(s): LIPASE, AMYLASE in the last 168 hours. No results for input(s): AMMONIA in the last 168 hours. CBC: Recent Labs  Lab 11/27/20 0338 11/28/20 0347 11/29/20 2256 11/30/20 0719 12/01/20 0420  WBC 4.9 5.1 7.6 6.6 6.2  NEUTROABS  --  2.8  --   --   --    HGB 8.5* 8.2* 8.1* 7.7* 7.8*  HCT 26.2* 24.7* 25.4* 23.6* 23.9*  MCV 88.8 89.2 90.1 88.7 87.9  PLT 84* 100* 107* 114* 122*   Cardiac Enzymes: No results for input(s): CKTOTAL, CKMB, CKMBINDEX, TROPONINI in the last 168 hours. CBG: Recent Labs  Lab 11/30/20 0619 11/30/20 0754 11/30/20 0813 11/30/20 0839 11/30/20 1139  GLUCAP 72 72 67* 112* 71    Iron Studies:  Recent Labs    11/29/20 1715  IRON 44  TIBC 293  FERRITIN 135   Studies/Results: No results found. Marland Kitchen acetaZOLAMIDE  250 mg Oral BID  . atorvastatin  40 mg Oral Daily  . calcitRIOL  0.5 mcg Oral Daily  . darbepoetin (ARANESP) injection -  NON-DIALYSIS  25 mcg Subcutaneous Q Fri-1800  . metolazone  5 mg Oral BID  . sevelamer carbonate  1,600 mg Oral TID WC    BMET    Component Value Date/Time   NA 128 (L) 12/02/2020 0308   NA 142 11/08/2020 1039   K 4.3 12/02/2020 0308   CL 88 (L) 12/02/2020 0308   CO2 25 12/02/2020 0308   GLUCOSE 100 (H) 12/02/2020 0308   BUN 85 (H) 12/02/2020 0308   BUN 21 11/08/2020 1039   CREATININE 3.56 (H) 12/02/2020 0308   CREATININE 2.26 (H) 04/07/2018 1330   CALCIUM 9.2 12/02/2020 0308   CALCIUM 9.3 08/28/2011 1648   GFRNONAA 13 (L) 12/02/2020 0308   GFRNONAA 29 (L) 11/10/2012 1115   GFRAA 28 (L) 11/08/2020 1039   GFRAA 33 (L) 11/10/2012 1115   CBC    Component Value Date/Time   WBC 6.2 12/01/2020 0420   RBC 2.72 (L) 12/01/2020 0420   HGB 7.8 (L) 12/01/2020 0420   HGB 11.7 11/25/2018 0931   HCT 23.9 (L) 12/01/2020 0420   HCT 35.8 11/25/2018 0931   PLT 122 (L) 12/01/2020 0420   PLT 146 (L) 11/25/2018 0931   MCV 87.9 12/01/2020 0420   MCV 93 11/25/2018 0931   MCH 28.7 12/01/2020 0420   MCHC 32.6 12/01/2020 0420   RDW 16.7 (H) 12/01/2020 0420   RDW 13.1 11/25/2018 0931   LYMPHSABS 1.2 11/28/2020 0347   LYMPHSABS 2.0 11/25/2018 0931   MONOABS 0.8 11/28/2020 0347   EOSABS 0.3 11/28/2020 0347   EOSABS 0.7 (H) 11/25/2018 0931   BASOSABS 0.0 11/28/2020 0347    BASOSABS 0.1 11/25/2018 0931    Assessment/Plan: 1. AKI/CKD stage IV in setting of decompensated systolic and diastolic CHF with escalating doses of IV lasix. Likely cardiorenal syndrome. 1. Did not respond toincreasedIV lasix 120 mg q6 andIV albuminand started lasix drip on 11/29/20 and dobutamine with marked increase in UOP. 2. She is a poor candidate for dialysis given her multiple co-morbidities (especially her cardiomyopathy) and poor functional and nutritional status. She is a full code and recommend Palliative care consult to help set goals/limits of care.  3. Scr thankfully stable despite IV diuretics but dobutamine likely helping. 2. Acute on chronic systolic and diastolic CHF- profound volume overload. EF 30% with severe TR.Unclear why her weight increased since her past hospitalization.Appreciate input from HF team 1. Continue lasix drip at 30 mg/hrand follow UOP and daily weights 2. Continue with dobutamine 3. Possible RHC on Monday 4. Off coreg 5. Agree with increasing metolazone to 10 mg again today and follow 6. Poor overall prognosis given her high dobutamine demands and borderline BP.  Palliative care consult as above.  3. Complete heart block - s/p PPM, high percentage of RV pacing per Cardiology 4. P. Afib - on xarelto, now in NSR.  5. Thrombocytopenia - cont to follow 6. Severe protein malnutrition - alb 2.4 and will need protein supplementation. 7. Moderate MR and severe TR- as above recommend heart failure team consultation 8. Hyperkalemia- due to #1.improved with lokelma andincrease in IV lasix dose 9. Hyponatremia - due to volume overload. 10. Anemia of CKD stage IV- cont to follow and transfuse prn.Will give IV feraheme due to low TSAT. Started ESA. 11. Disposition - poor overall prognosis as above.  Palliative care consult pending.    Donetta Potts, MD [employer] [phone]

## 2020-12-02 NOTE — Progress Notes (Addendum)
   Subjective:   No acute overnight events.  Still complaining of some pain in right leg and in stomach.   Objective:  Vital signs in last 24 hours: Vitals:   12/01/20 1948 12/02/20 0002 12/02/20 0020 12/02/20 0419  BP: (!) 135/58 (!) 137/45  (!) 132/44  Pulse: 70 69  69  Resp: '18 18  18  '$ Temp: 98 F (36.7 C) 97.8 F (36.6 C)  (!) 97.5 F (36.4 C)  TempSrc: Oral Oral  Oral  SpO2: 99% 98%  100%  Weight:   60.7 kg     Intake/Output Summary (Last 24 hours) at 12/02/2020 1019 Last data filed at 12/02/2020 0843 Gross per 24 hour  Intake 1504.74 ml  Output 4952 ml  Net -3447.26 ml     Physical Exam: General: ill-appearing elderly female, lying in bed, NAD. CV: ventricle-paced rate, regular rhythm. Abdomen: distended, mild diffuse TTP, abdominal wall edema down to sacrum slightly improved from yesterday. MSK: left BKA, UNNA boot in place on RLE, b/l LE edema. Skin: warm and dry Neuro: AAOx3, no focal deficits noted.   Assessment/Plan:  Principal Problem:   Acute exacerbation of CHF (congestive heart failure) (HCC) Active Problems:   Complete heart block (HCC)   CKD stage 4 secondary to hypertension ([place])   Diabetes mellitus (HCC)   Paroxysmal atrial fibrillation (HCC)  [name] [name] is a 70 year old female with PMH of CAD w/sinus arrest now s/p pacemaker, HFrEF (EF 30-35%), paroxysmal atrial fibrillation on eliquis, CKD stage IV, and normocytic anemia presenting with increasing SHOB and weight gain, admitted for HFrEF exacerbation.  HFrEF Exacerbation CAD, Hx of sinus arrest s/p pacemaker Moderate MR, Severe TR Paroxysmal Atrial Fibrillation Had 4.4L UOP over past 24 hours with aggressive diuresis with lasix infusion '@30cc'$ /hr, metolazone '10mg'$ , and dobutamine infusion. Still volume overloaded on exam with slight improvement. Nephrology and Advanced HF team following due to impaired kidney function and possible component of cardiogenic shock. Likely plan for right heart  cath tomorrow by Advanced HF team. Spoke with HF team, will give eliquis through this morning but hold tonight and tomorrow morning's doses. Palliative care consult placed for goals of care discussions. -greatly appreciate nephrology and advanced HF recommendations -lasix infusion '@30cc'$ /hr -dobutamine infusion -metolazone '5mg'$  BID -diamox -closely monitor UOP, strict I/O's, daily weights   AKI on CKD Stage IV Hyperkalemia Hyponatremia Hyperphosphatemia Likely component of cardiorenal syndrome. Kidney function stable from yesterday. Hopeful there will be improvement once volume overload begins to resolve. She is a poor candidate for dialysis due to multiple comorbidities. -sodium 128 >> reflecting inability to remove excess free water -potassium 4.3 this AM -phosphate 7.1 this AM >> added sevelamer '1600mg'$  TID with meals -trend renal function panel, avoid nephrotoxic medications   Prior to Admission Living Arrangement: Home Anticipated Discharge Location: TBD Barriers to Discharge: continued medical management Dispo: Anticipated discharge in approximately 3-4 day(s).   Virl Axe, MD 12/02/2020, 10:12 AM Pager: [phone] After 5pm on weekdays and 1pm on weekends: On Call pager [phone]

## 2020-12-02 NOTE — Progress Notes (Signed)
Patient ID: [name] [name], female   DOB: April 09, 1951, 70 y.o.   MRN: [id number]     Advanced Heart Failure Rounding Note  PCP-Cardiologist: No primary care provider on file.    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 Now admitted with worsening HF and AKI likely due to cardiorenal syndrome   Echo EF 30% with severe TR  Started on empiric DBA for suspected low output HF and cardiorenal syndrome + Lasix gtt.   Subjective:    On DBA 4. lasix at 30 mg/hr. Improved UOP yesterday. Weight trending down.   Denies CP, SOB, orthopnea or PND. Feels tight.   Creatinine stuck at 3.5-3.6   Objective:   Weight Range: 60.7 kg Body mass index is 24.47 kg/m.   Vital Signs:   Temp:  [97.5 F (36.4 C)-98 F (36.7 C)] 97.5 F (36.4 C) (03/06 0419) Pulse Rate:  [69-70] 69 (03/06 0419) Resp:  [18] 18 (03/06 0419) BP: (132-149)/(44-58) 132/44 (03/06 0419) SpO2:  [98 %-100 %] 100 % (03/06 0419) Weight:  [60.7 kg] 60.7 kg (03/06 0020) Last BM Date: 12/01/20  Weight change: Filed Weights   11/30/20 0137 12/01/20 0001 12/02/20 0020  Weight: 69.4 kg 67.5 kg 60.7 kg    Intake/Output:   Intake/Output Summary (Last 24 hours) at 12/02/2020 1026 Last data filed at 12/02/2020 0843 Gross per 24 hour  Intake 1504.74 ml  Output 4952 ml  Net -3447.26 ml      Physical Exam    General: NAD Neck: JVP 16+, no thyromegaly or thyroid nodule.  Lungs: Clear to auscultation bilaterally with normal respiratory effort. CV: Nondisplaced PMI.  Heart regular S1/S2, no S3/S4, 2/6 HSM LLSB.  No peripheral edema.  Abdomen: Soft, nontender, no hepatosplenomegaly, mild distention.  Skin: Intact without lesions or rashes.  Neurologic: Alert and oriented x 3.  Psych: Normal affect. Extremities: No clubbing or cyanosis. S/p left BKA HEENT: Normal.    Telemetry   V-paced 70s Personally reviewed   Labs     CBC Recent Labs    11/30/20 0719 12/01/20 0420  WBC 6.6 6.2  HGB 7.7* 7.8*  HCT 23.6* 23.9*  MCV 88.7 87.9  PLT 114* 123XX123*   Basic Metabolic Panel Recent Labs    11/30/20 0933 12/01/20 0420 12/02/20 0308  NA  --  132* 128*  K  --  4.7 4.3  CL  --  94* 88*  CO2  --  24 25  GLUCOSE  --  93 100*  BUN  --  83* 85*  CREATININE  --  3.63* 3.56*  CALCIUM  --  9.3 9.2  MG 2.2  --   --   PHOS  --  7.2* 7.1*   Liver Function Tests Recent Labs    11/30/20 0933 12/01/20 0420 12/02/20 0308  AST 37  --   --   ALT 24  --   --   ALKPHOS 92  --   --   BILITOT 1.5*  --   --   PROT 5.9*  --   --   ALBUMIN 2.7* 2.6* 2.7*   No results for input(s): LIPASE, AMYLASE in the last 72 hours. Cardiac Enzymes No results for input(s): CKTOTAL, CKMB, CKMBINDEX, TROPONINI in the last 72 hours.  BNP: BNP (last 3 results) Recent Labs    06/20/20 0956 11/21/20 1105  BNP >4,500.0* >4,500.0*    ProBNP (  last 3 results) No results for input(s): PROBNP in the last 8760 hours.   D-Dimer No results for input(s): DDIMER in the last 72 hours. Hemoglobin A1C No results for input(s): HGBA1C in the last 72 hours. Fasting Lipid Panel No results for input(s): CHOL, HDL, LDLCALC, TRIG, CHOLHDL, LDLDIRECT in the last 72 hours. Thyroid Function Tests No results for input(s): TSH, T4TOTAL, T3FREE, THYROIDAB in the last 72 hours.  Invalid input(s): FREET3  Other results:   Imaging    No results found.   Medications:     Scheduled Medications: . acetaZOLAMIDE  250 mg Oral BID  . atorvastatin  40 mg Oral Daily  . calcitRIOL  0.5 mcg Oral Daily  . darbepoetin (ARANESP) injection - NON-DIALYSIS  25 mcg Subcutaneous Q Fri-1800  . metolazone  5 mg Oral BID    Infusions: . DOBUTamine 4 mcg/kg/min (12/01/20 1447)  . furosemide (LASIX) 200 mg in dextrose 5% 100 mL ('2mg'$ /mL) infusion 30 mg/hr (12/02/20 0839)    PRN Medications: acetaminophen **OR** acetaminophen, albuterol,  hydrOXYzine, loperamide, polyethylene glycol    Assessment/Plan    1. A/C Systolic Heart Failure - Etiology unclear possibly from RV pacing?  Has St Jude PPM - Myoview 08/2020  Showed EF 30% with no signs of ischemia. Unable to cath with CKD. No chest pain.  -ECHO this admit with EF 30-35% RV normal. Severe TR - Better diuresis yesterday, creatinine stable at 3.56.  Now on DBA 4, lasix 30 mg/hr.  Will continue acetazolamide and give metolazone 5 mg bid today.  - Still markedly volume overloaded but improving diuresis.  - Think situation is ultimately untenable with medications alone and would need HD to control volume.  Suspect she would be a poor candidate (discussed with Dr. Marval Regal today).  - I think she is end-stage. Will keep NPO at midnight if want RHC.  - Off Coreg w/ suspected low output.   - No dig/spir/arni with elevated creatinine.  - If improves will need to consider PM upgrade to BiV (to avoid RV pacing) and get closer look to see if RV lead is adherent to TV.    2.  CKD Stage IV - Creatinine baseline  2.9-3.3 - SCr stable 3.5 - 3.6 range - Suspect Cardiorenal. Continue DBA + Lasix gtt  - Nephrology following. Suspect she would have a hard time tolerating iHD.   3. PAF - In NSR - On Xarelto  4. 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   5. PAD -H/O LBKA   6. Hyperthyroidism  - Thyroid US 11/12/20 with numerous small nodules  -11/22/20 TSH  0.014 Free T3 1.3   7. Severe TR -Noted on ECHO   8. Hyperkalemia  - K 5.6 in the setting of renal failure - Received lokelma 10 g x 2 - K 4.7  9. Anemia  - Hgb has been drifting down from 10 --->8.2->7.7  - Ferritin 135, Iron 44, Sats 15% - Gave Feraheme   10. CAD  - mild CAD on cath 2005  - Myoview 2021 no signs of ischemia - On statin + xarelto  11. Social  Referred to SW for assistance with financial difficulties.      Length of Stay: 71  Loralie Champagne, MD  12/02/2020, 10:26 AM  Advanced Heart Failure Team Pager [phone] (M-F; 7a - 4p)  Please contact [facility] for night-coverage after hours (4p -7a ) and  weekends on [email or web address]

## 2020-12-02 NOTE — Plan of Care (Signed)
  Problem: Skin Integrity: Goal: Risk for impaired skin integrity will decrease Outcome: Completed/Met

## 2020-12-03 ENCOUNTER — Encounter (HOSPITAL_COMMUNITY)
Admission: AD | Disposition: A | Discharge status: Home / Self Care | Payer: Self-pay | Source: Ambulatory Visit | Attending: Student in an Organized Health Care Education/Training Program

## 2020-12-03 DIAGNOSIS — Z794 Long term (current) use of insulin: Secondary | ICD-10-CM

## 2020-12-03 DIAGNOSIS — I442 Atrioventricular block, complete: Secondary | ICD-10-CM | POA: Diagnosis not present

## 2020-12-03 DIAGNOSIS — N184 Chronic kidney disease, stage 4 (severe): Secondary | ICD-10-CM | POA: Diagnosis not present

## 2020-12-03 DIAGNOSIS — Z7189 Other specified counseling: Secondary | ICD-10-CM

## 2020-12-03 DIAGNOSIS — N179 Acute kidney failure, unspecified: Secondary | ICD-10-CM

## 2020-12-03 DIAGNOSIS — E1122 Type 2 diabetes mellitus with diabetic chronic kidney disease: Secondary | ICD-10-CM

## 2020-12-03 DIAGNOSIS — I5020 Unspecified systolic (congestive) heart failure: Secondary | ICD-10-CM | POA: Diagnosis not present

## 2020-12-03 DIAGNOSIS — Z66 Do not resuscitate: Secondary | ICD-10-CM

## 2020-12-03 DIAGNOSIS — I5023 Acute on chronic systolic (congestive) heart failure: Secondary | ICD-10-CM | POA: Diagnosis not present

## 2020-12-03 DIAGNOSIS — I129 Hypertensive chronic kidney disease with stage 1 through stage 4 chronic kidney disease, or unspecified chronic kidney disease: Secondary | ICD-10-CM | POA: Diagnosis not present

## 2020-12-03 DIAGNOSIS — I130 Hypertensive heart and chronic kidney disease with heart failure and stage 1 through stage 4 chronic kidney disease, or unspecified chronic kidney disease: Secondary | ICD-10-CM | POA: Diagnosis not present

## 2020-12-03 DIAGNOSIS — Z515 Encounter for palliative care: Secondary | ICD-10-CM

## 2020-12-03 HISTORY — PX: RIGHT HEART CATH: CATH118263

## 2020-12-03 LAB — POCT I-STAT EG7
Acid-Base Excess: 6 mmol/L — ABNORMAL HIGH (ref 0.0–2.0)
Acid-Base Excess: 8 mmol/L — ABNORMAL HIGH (ref 0.0–2.0)
Acid-Base Excess: 9 mmol/L — ABNORMAL HIGH (ref 0.0–2.0)
Bicarbonate: 31.4 mmol/L — ABNORMAL HIGH (ref 20.0–28.0)
Bicarbonate: 33.5 mmol/L — ABNORMAL HIGH (ref 20.0–28.0)
Bicarbonate: 34.1 mmol/L — ABNORMAL HIGH (ref 20.0–28.0)
Calcium, Ion: 1.05 mmol/L — ABNORMAL LOW (ref 1.15–1.40)
Calcium, Ion: 1.13 mmol/L — ABNORMAL LOW (ref 1.15–1.40)
Calcium, Ion: 1.15 mmol/L (ref 1.15–1.40)
HCT: 24 % — ABNORMAL LOW (ref 36.0–46.0)
HCT: 25 % — ABNORMAL LOW (ref 36.0–46.0)
HCT: 26 % — ABNORMAL LOW (ref 36.0–46.0)
Hemoglobin: 8.2 g/dL — ABNORMAL LOW (ref 12.0–15.0)
Hemoglobin: 8.5 g/dL — ABNORMAL LOW (ref 12.0–15.0)
Hemoglobin: 8.8 g/dL — ABNORMAL LOW (ref 12.0–15.0)
O2 Saturation: 73 %
O2 Saturation: 73 %
O2 Saturation: 81 %
Potassium: 3.5 mmol/L (ref 3.5–5.1)
Potassium: 3.8 mmol/L (ref 3.5–5.1)
Potassium: 3.8 mmol/L (ref 3.5–5.1)
Sodium: 131 mmol/L — ABNORMAL LOW (ref 135–145)
Sodium: 131 mmol/L — ABNORMAL LOW (ref 135–145)
Sodium: 135 mmol/L (ref 135–145)
TCO2: 33 mmol/L — ABNORMAL HIGH (ref 22–32)
TCO2: 35 mmol/L — ABNORMAL HIGH (ref 22–32)
TCO2: 36 mmol/L — ABNORMAL HIGH (ref 22–32)
pCO2, Ven: 48.9 mmHg (ref 44.0–60.0)
pCO2, Ven: 51 mmHg (ref 44.0–60.0)
pCO2, Ven: 51.2 mmHg (ref 44.0–60.0)
pH, Ven: 7.416 (ref 7.250–7.430)
pH, Ven: 7.426 (ref 7.250–7.430)
pH, Ven: 7.431 — ABNORMAL HIGH (ref 7.250–7.430)
pO2, Ven: 38 mmHg (ref 32.0–45.0)
pO2, Ven: 39 mmHg (ref 32.0–45.0)
pO2, Ven: 45 mmHg (ref 32.0–45.0)

## 2020-12-03 LAB — RENAL FUNCTION PANEL
Albumin: 2.5 g/dL — ABNORMAL LOW (ref 3.5–5.0)
Anion gap: 14 (ref 5–15)
BUN: 91 mg/dL — ABNORMAL HIGH (ref 8–23)
CO2: 29 mmol/L (ref 22–32)
Calcium: 9.6 mg/dL (ref 8.9–10.3)
Chloride: 86 mmol/L — ABNORMAL LOW (ref 98–111)
Creatinine, Ser: 4.25 mg/dL — ABNORMAL HIGH (ref 0.44–1.00)
GFR, Estimated: 11 mL/min — ABNORMAL LOW (ref >60)
Glucose, Bld: 89 mg/dL (ref 70–99)
Phosphorus: 7.5 mg/dL — ABNORMAL HIGH (ref 2.5–4.6)
Potassium: 4.2 mmol/L (ref 3.5–5.1)
Sodium: 129 mmol/L — ABNORMAL LOW (ref 135–145)

## 2020-12-03 LAB — CBC
HCT: 22.8 % — ABNORMAL LOW (ref 36.0–46.0)
Hemoglobin: 7.8 g/dL — ABNORMAL LOW (ref 12.0–15.0)
MCH: 29.8 pg (ref 26.0–34.0)
MCHC: 34.2 g/dL (ref 30.0–36.0)
MCV: 87 fL (ref 80.0–100.0)
Platelets: 146 10*3/uL — ABNORMAL LOW (ref 150–400)
RBC: 2.62 MIL/uL — ABNORMAL LOW (ref 3.87–5.11)
RDW: 16.5 % — ABNORMAL HIGH (ref 11.5–15.5)
WBC: 6.3 10*3/uL (ref 4.0–10.5)
nRBC: 0 % (ref 0.0–0.2)

## 2020-12-03 LAB — GLUCOSE, CAPILLARY
Glucose-Capillary: 127 mg/dL — ABNORMAL HIGH (ref 70–99)
Glucose-Capillary: 104 mg/dL — ABNORMAL HIGH (ref 70–99)
Glucose-Capillary: 95 mg/dL (ref 70–99)
Glucose-Capillary: 91 mg/dL (ref 70–99)
Glucose-Capillary: 109 mg/dL — ABNORMAL HIGH (ref 70–99)
Glucose-Capillary: 105 mg/dL — ABNORMAL HIGH (ref 70–99)
Glucose-Capillary: 117 mg/dL — ABNORMAL HIGH (ref 70–99)
Glucose-Capillary: 93 mg/dL (ref 70–99)
Glucose-Capillary: 78 mg/dL (ref 70–99)
Glucose-Capillary: 140 mg/dL — ABNORMAL HIGH (ref 70–99)
Glucose-Capillary: 129 mg/dL — ABNORMAL HIGH (ref 70–99)
Glucose-Capillary: 85 mg/dL (ref 70–99)
Glucose-Capillary: 86 mg/dL (ref 70–99)
Glucose-Capillary: 131 mg/dL — ABNORMAL HIGH (ref 70–99)
Glucose-Capillary: 121 mg/dL — ABNORMAL HIGH (ref 70–99)

## 2020-12-03 SURGERY — RIGHT HEART CATH
Anesthesia: LOCAL

## 2020-12-03 MED ORDER — LIDOCAINE HCL (PF) 1 % IJ SOLN
INTRAMUSCULAR | Status: AC
  Filled 2020-12-03: qty 30

## 2020-12-03 MED ORDER — SODIUM CHLORIDE 0.9% FLUSH: 3.0000 mL | INTRAVENOUS | Status: DC | PRN

## 2020-12-03 MED ORDER — DICLOFENAC SODIUM 1 % EX GEL
4.0000 g | Freq: Three times a day (TID) | CUTANEOUS | Status: DC | PRN
  Administered 2020-12-03: 4 g via TOPICAL
  Filled 2020-12-03: qty 100

## 2020-12-03 MED ORDER — HEPARIN (PORCINE) IN NACL 1000-0.9 UT/500ML-% IV SOLN
INTRAVENOUS | Status: DC | PRN
  Administered 2020-12-03: 500 mL

## 2020-12-03 MED ORDER — APIXABAN 5 MG PO TABS
5.0000 mg | ORAL_TABLET | Freq: Two times a day (BID) | ORAL | Status: DC
  Administered 2020-12-03 – 2020-12-04 (×2): 5 mg via ORAL
  Filled 2020-12-03 (×2): qty 1

## 2020-12-03 MED ORDER — SODIUM CHLORIDE 0.9 % IV SOLN: 250.0000 mL | INTRAVENOUS | Status: DC | PRN

## 2020-12-03 MED ORDER — ACETAMINOPHEN 325 MG PO TABS
650.0000 mg | ORAL_TABLET | ORAL | Status: DC | PRN
  Administered 2020-12-07: 650 mg via ORAL
  Filled 2020-12-03: qty 2

## 2020-12-03 MED ORDER — SODIUM CHLORIDE 0.9% FLUSH
3.0000 mL | Freq: Two times a day (BID) | INTRAVENOUS | Status: DC
  Administered 2020-12-03 – 2020-12-08 (×11): 3 mL via INTRAVENOUS

## 2020-12-03 MED ORDER — LIDOCAINE HCL (PF) 1 % IJ SOLN
INTRAMUSCULAR | Status: DC | PRN
  Administered 2020-12-03: 10 mL

## 2020-12-03 MED ORDER — SODIUM CHLORIDE 0.9 % IV SOLN: INTRAVENOUS | Status: DC

## 2020-12-03 MED ORDER — ONDANSETRON HCL 4 MG/2ML IJ SOLN: 4.0000 mg | Freq: Four times a day (QID) | INTRAMUSCULAR | Status: DC | PRN

## 2020-12-03 MED ORDER — HEPARIN (PORCINE) IN NACL 1000-0.9 UT/500ML-% IV SOLN
INTRAVENOUS | Status: AC
  Filled 2020-12-03: qty 500

## 2020-12-03 MED ORDER — SODIUM CHLORIDE 0.9% FLUSH
3.0000 mL | Freq: Two times a day (BID) | INTRAVENOUS | Status: DC
  Administered 2020-12-03 – 2020-12-08 (×8): 3 mL via INTRAVENOUS

## 2020-12-03 MED ORDER — HYDRALAZINE HCL 20 MG/ML IJ SOLN: 10.0000 mg | INTRAMUSCULAR | Status: AC | PRN

## 2020-12-03 MED ORDER — LABETALOL HCL 5 MG/ML IV SOLN: 10.0000 mg | INTRAVENOUS | Status: AC | PRN

## 2020-12-03 MED ORDER — ENOXAPARIN SODIUM 30 MG/0.3ML ~~LOC~~ SOLN: 30.0000 mg | SUBCUTANEOUS | Status: DC

## 2020-12-03 MED ORDER — ASPIRIN 81 MG PO CHEW
81.0000 mg | CHEWABLE_TABLET | ORAL | Status: AC
  Administered 2020-12-03: 81 mg via ORAL
  Filled 2020-12-03: qty 1

## 2020-12-03 SURGICAL SUPPLY — 11 items
CATH SWAN GANZ 7F STRAIGHT (CATHETERS) ×2 IMPLANT
COVER SWIFTLINK CONNECTOR (BAG) ×2 IMPLANT
KIT MICROPUNCTURE NIT STIFF (SHEATH) ×2 IMPLANT
PACK CARDIAC CATHETERIZATION (CUSTOM PROCEDURE TRAY) ×2 IMPLANT
SHEATH GLIDE SLENDER 4/5FR (SHEATH) ×2 IMPLANT
SHEATH PINNACLE 7F 10CM (SHEATH) ×2 IMPLANT
SHEATH PROBE COVER 6X72 (BAG) ×2 IMPLANT
TRANSDUCER W/STOPCOCK (MISCELLANEOUS) ×2 IMPLANT
TUBING ART PRESS 72  MALE/FEM (TUBING) ×2
TUBING ART PRESS 72 MALE/FEM (TUBING) ×1 IMPLANT
WIRE EMERALD 3MM-J .025X260CM (WIRE) ×2 IMPLANT

## 2020-12-03 NOTE — Progress Notes (Signed)
Patient ID: [name] [name], female   DOB: 09/01/1951, 70 y.o.   MRN: [id number] S: Patient is frustrated that she cannot eat today.  States that she feels about the same and is working with therapy. O:BP (!) 153/53 (BP Location: Right Arm)   Pulse 70   Temp 98.4 F (36.9 C) (Oral)   Resp 18   Wt 61.1 kg   SpO2 100%   BMI 24.62 kg/m   Intake/Output Summary (Last 24 hours) at 12/03/2020 1057 Last data filed at 12/03/2020 0600 Gross per 24 hour  Intake 924.61 ml  Output 3300 ml  Net -2375.39 ml   Intake/Output: I/O last 3 completed shifts: In: 2089.4 [P.O.:960; I.V.:1129.4] Out: 7852 [Urine:7850; Stool:2]  Intake/Output this shift:  No intake/output data recorded. Weight change: 0.363 kg Gen: Appears chronically ill, sitting on the side of bed CVS: Normal rate, distended neck veins Resp: Bilateral chest rise, mild increased work of breathing Abd: Mild distention Ext: Lower extremity edema trace  Recent Labs  Lab 11/28/20 1646 11/29/20 0407 11/29/20 1715 11/30/20 0624 11/30/20 0933 12/01/20 0420 12/02/20 0308 12/03/20 0423  NA 133* 130* 133* 131*  --  132* 128* 129*  K 5.5* 4.9 4.7 5.6*  --  4.7 4.3 4.2  CL 96* 95* 97* 95*  --  94* 88* 86*  CO2 '26 24 22 '$ 21*  --  '24 25 29  '$ GLUCOSE 103* 115* 117* 66*  --  93 100* 89  BUN 72* 73* 75* 87*  --  83* 85* 91*  CREATININE 3.33* 3.31* 3.48* 3.55*  --  3.63* 3.56* 4.25*  ALBUMIN  --   --   --   --  2.7* 2.6* 2.7* 2.5*  CALCIUM 8.9 8.8* 8.9 8.9  --  9.3 9.2 9.6  PHOS  --   --   --   --   --  7.2* 7.1* 7.5*  AST  --   --   --   --  37  --   --   --   ALT  --   --   --   --  24  --   --   --    Liver Function Tests: Recent Labs  Lab 11/30/20 0933 12/01/20 0420 12/02/20 0308 12/03/20 0423  AST 37  --   --   --   ALT 24  --   --   --   ALKPHOS 92  --   --   --   BILITOT 1.5*  --   --   --   PROT 5.9*  --   --   --   ALBUMIN 2.7* 2.6* 2.7* 2.5*   No results for input(s): LIPASE, AMYLASE in the last 168 hours. No results  for input(s): AMMONIA in the last 168 hours. CBC: Recent Labs  Lab 11/28/20 0347 11/29/20 2256 11/30/20 0719 12/01/20 0420 12/03/20 0423  WBC 5.1 7.6 6.6 6.2 6.3  NEUTROABS 2.8  --   --   --   --   HGB 8.2* 8.1* 7.7* 7.8* 7.8*  HCT 24.7* 25.4* 23.6* 23.9* 22.8*  MCV 89.2 90.1 88.7 87.9 87.0  PLT 100* 107* 114* 122* 146*   Cardiac Enzymes: No results for input(s): CKTOTAL, CKMB, CKMBINDEX, TROPONINI in the last 168 hours. CBG: Recent Labs  Lab 11/30/20 0619 11/30/20 0754 11/30/20 0813 11/30/20 0839 11/30/20 1139  GLUCAP 72 72 67* 112* 71    Iron Studies:  No results for input(s): IRON, TIBC, TRANSFERRIN, FERRITIN in the last  72 hours. Studies/Results: No results found. Marland Kitchen aspirin  81 mg Oral Pre-Cath  . atorvastatin  40 mg Oral Daily  . calcitRIOL  0.5 mcg Oral Daily  . darbepoetin (ARANESP) injection - NON-DIALYSIS  25 mcg Subcutaneous Q Fri-1800  . metolazone  5 mg Oral BID  . sevelamer carbonate  1,600 mg Oral TID WC  . sodium chloride flush  3 mL Intravenous Q12H    BMET    Component Value Date/Time   NA 129 (L) 12/03/2020 0423   NA 142 11/08/2020 1039   K 4.2 12/03/2020 0423   CL 86 (L) 12/03/2020 0423   CO2 29 12/03/2020 0423   GLUCOSE 89 12/03/2020 0423   BUN 91 (H) 12/03/2020 0423   BUN 21 11/08/2020 1039   CREATININE 4.25 (H) 12/03/2020 0423   CREATININE 2.26 (H) 04/07/2018 1330   CALCIUM 9.6 12/03/2020 0423   CALCIUM 9.3 08/28/2011 1648   GFRNONAA 11 (L) 12/03/2020 0423   GFRNONAA 29 (L) 11/10/2012 1115   GFRAA 28 (L) 11/08/2020 1039   GFRAA 33 (L) 11/10/2012 1115   CBC    Component Value Date/Time   WBC 6.3 12/03/2020 0423   RBC 2.62 (L) 12/03/2020 0423   HGB 7.8 (L) 12/03/2020 0423   HGB 11.7 11/25/2018 0931   HCT 22.8 (L) 12/03/2020 0423   HCT 35.8 11/25/2018 0931   PLT 146 (L) 12/03/2020 0423   PLT 146 (L) 11/25/2018 0931   MCV 87.0 12/03/2020 0423   MCV 93 11/25/2018 0931   MCH 29.8 12/03/2020 0423   MCHC 34.2 12/03/2020 0423    RDW 16.5 (H) 12/03/2020 0423   RDW 13.1 11/25/2018 0931   LYMPHSABS 1.2 11/28/2020 0347   LYMPHSABS 2.0 11/25/2018 0931   MONOABS 0.8 11/28/2020 0347   EOSABS 0.3 11/28/2020 0347   EOSABS 0.7 (H) 11/25/2018 0931   BASOSABS 0.0 11/28/2020 0347   BASOSABS 0.1 11/25/2018 0931    Assessment/Plan: 1. AKI/CKD stage IV in setting of decompensated systolic and diastolic CHF with escalating doses of IV lasix. Likely cardiorenal syndrome.Creatinine slightly worse today likely with brisk diuresis. 1. Audiology decreasing diuretic regimen today 2. Follow-up RHC 3. She is a poor candidate for dialysis given her multiple co-morbidities (especially her cardiomyopathy) and poor functional and nutritional status. She is a full code and recommend Palliative care consult to help set goals/limits of care.  4. Monitor creatinine closely 2. Acute on chronic systolic and diastolic CHF- profound volume overload. EF 30% with severe TR.Unclear why her weight increased since her past hospitalization.Appreciate input from HF team 1. Decreasing diuretics today 2. Follow-up RHC 3. Recommend palliative care consult 3. Complete heart block - s/p PPM, high percentage of RV pacing per Cardiology 4. P. Afib - on xarelto, now in NSR.  5. Thrombocytopenia - cont to follow 6. Severe protein malnutrition - alb 2.4 and will need protein supplementation. 7. Moderate MR and severe TR- as above recommend heart failure team consultation 8. Hyponatremia - due to volume overload. 9. Hyperphosphatemia: Phosphorus 7.5 today.  Add sevelamer 800 mg 3 times daily 10. Anemia of CKD stage IV- cont to follow and transfuse prn.Will give IV feraheme due to low TSAT. Started ESA. 11. Disposition - poor overall prognosis as above.  Palliative care consult pending.

## 2020-12-03 NOTE — Progress Notes (Signed)
All belongings were returned to the pt at the pt's request. All items were verified with NT at the bedside with pt and belongings list. Pt stated she gave [place] her "blue birthstone ring". However, prior to cath, this ring was not verified with jewelry by with pt, nurse or NT. Pt found two yellow metal rings, one with a clear blue stone and one in the shape of an oval in her purse. Pt stated all items were accounted for an signed valuables sheet.

## 2020-12-03 NOTE — H&P (View-Only) (Signed)
Patient ID: [name] [name], female   DOB: Mar 03, 1951, 70 y.o.   MRN: [id number]     Advanced Heart Failure Rounding Note  PCP-Cardiologist: No primary care provider on file.    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 Now admitted with worsening HF and AKI likely due to cardiorenal syndrome   Echo EF 30% with severe TR  Started on empiric DBA for suspected low output HF and cardiorenal syndrome + Lasix gtt.   Subjective:    On DBA 4. lasix at 30 mg/hr, acetazolamide and on metolazone 2.'5mg'$  BID.  6L out yesterday (has not been completely documented)  Denies CP, SOB, orthopnea or PND. Complaining of muscle soreness and cramping in arms and legs this morning.    Creatinine worsened to 4.25 from 3.5 yesterday   Objective:   Weight Range: 61.1 kg Body mass index is 24.62 kg/m.   Vital Signs:   Temp:  [97.5 F (36.4 C)-98.4 F (36.9 C)] 98.4 F (36.9 C) (03/07 0438) Pulse Rate:  [68-70] 70 (03/07 0438) Resp:  [18] 18 (03/07 0438) BP: (128-153)/(52-73) 153/53 (03/07 0438) SpO2:  [97 %-100 %] 100 % (03/07 0438) Weight:  [61.1 kg] 61.1 kg (03/07 0100) Last BM Date: 12/01/20  Weight change: Filed Weights   12/01/20 0001 12/02/20 0020 12/03/20 0100  Weight: 67.5 kg 60.7 kg 61.1 kg    Intake/Output:   Intake/Output Summary (Last 24 hours) at 12/03/2020 0746 Last data filed at 12/03/2020 0600 Gross per 24 hour  Intake 1044.61 ml  Output 4300 ml  Net -3255.39 ml      Physical Exam    Cardiac: Neck veins distended, normal rate and rhythm, clear s1 and s2, 3/6 TR, rubs or gallops, no LE edema, left bka Pulmonary: distant breath sounds, no rales, not in distress Abdominal: soft and nontender Psych: Alert, conversant, in good spirits   Telemetry   V-paced 70s Personally reviewed   Labs    CBC Recent Labs    12/01/20 0420 12/03/20 0423  WBC 6.2 6.3  HGB 7.8* 7.8*   HCT 23.9* 22.8*  MCV 87.9 87.0  PLT 122* 123456*   Basic Metabolic Panel Recent Labs    11/30/20 0933 12/01/20 0420 12/02/20 0308 12/03/20 0423  NA  --    < > 128* 129*  K  --    < > 4.3 4.2  CL  --    < > 88* 86*  CO2  --    < > 25 29  GLUCOSE  --    < > 100* 89  BUN  --    < > 85* 91*  CREATININE  --    < > 3.56* 4.25*  CALCIUM  --    < > 9.2 9.6  MG 2.2  --   --   --   PHOS  --    < > 7.1* 7.5*   < > = values in this interval not displayed.   Liver Function Tests Recent Labs    11/30/20 0933 12/01/20 0420 12/02/20 0308 12/03/20 0423  AST 37  --   --   --   ALT 24  --   --   --   ALKPHOS 92  --   --   --   BILITOT 1.5*  --   --   --   PROT 5.9*  --   --   --  ALBUMIN 2.7*   < > 2.7* 2.5*   < > = values in this interval not displayed.   No results for input(s): LIPASE, AMYLASE in the last 72 hours. Cardiac Enzymes No results for input(s): CKTOTAL, CKMB, CKMBINDEX, TROPONINI in the last 72 hours.  BNP: BNP (last 3 results) Recent Labs    06/20/20 0956 11/21/20 1105  BNP >4,500.0* >4,500.0*    ProBNP (last 3 results) No results for input(s): PROBNP in the last 8760 hours.   D-Dimer No results for input(s): DDIMER in the last 72 hours. Hemoglobin A1C No results for input(s): HGBA1C in the last 72 hours. Fasting Lipid Panel No results for input(s): CHOL, HDL, LDLCALC, TRIG, CHOLHDL, LDLDIRECT in the last 72 hours. Thyroid Function Tests No results for input(s): TSH, T4TOTAL, T3FREE, THYROIDAB in the last 72 hours.  Invalid input(s): FREET3  Other results:   Imaging    No results found.   Medications:     Scheduled Medications: . acetaZOLAMIDE  250 mg Oral BID  . atorvastatin  40 mg Oral Daily  . calcitRIOL  0.5 mcg Oral Daily  . darbepoetin (ARANESP) injection - NON-DIALYSIS  25 mcg Subcutaneous Q Fri-1800  . metolazone  5 mg Oral BID  . sevelamer carbonate  1,600 mg Oral TID WC    Infusions: . DOBUTamine 4 mcg/kg/min (12/02/20  1516)  . furosemide (LASIX) 200 mg in dextrose 5% 100 mL ('2mg'$ /mL) infusion 30 mg/hr (12/03/20 0436)    PRN Medications: acetaminophen **OR** acetaminophen, albuterol, diclofenac Sodium, hydrOXYzine, loperamide, polyethylene glycol    Assessment/Plan    1. A/C Systolic Heart Failure - Etiology unclear possibly from RV pacing?  Has St Jude PPM - Myoview 08/2020  Showed EF 30% with no signs of ischemia. Unable to cath with CKD. No chest pain.  -ECHO this admit with EF 30-35% RV normal. Severe TR - Brisk diuresis yesterday, creatinine worsened at 4.25.  On DBA 4, lasix 30 mg/hr acetazolimide, metolazone 2.5 mg BID.   --Decrease diuretics today.  Has had improvement in peripheral edema, breathing unlabored working with PT took frequent breaks but mostly limited by muscle pain, distended neck veins but in the setting of severe TR - Take for RHC today to assess hemodynamics/output - Poor candidate for dialysis  - Off Coreg w/ suspected low output.   - No dig/spir/arni with elevated creatinine.  - If improves will need to consider PM upgrade to BiV (to avoid RV pacing) and get closer look to see if RV lead is adherent to TV.    2.  CKD Stage IV - Creatinine baseline  2.9-3.3 - SCr worsened to 4.25, may be a little dry decrease diuretics - Suspect Contribution from Cardiorenal. Continue DBA  - Nephrology following. Suspect she would have a hard time tolerating iHD.   3. PAF - In NSR - On Xarelto  4. 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   5. PAD -H/O LBKA   6. Hyperthyroidism  - Thyroid US 11/12/20 with numerous small nodules  -11/22/20 TSH  0.014 Free T3 1.3   7. Severe TR -Noted on ECHO   8. Hyperkalemia  - K 5.6 in the setting of renal failure - Received lokelma  - K stabilized 4.2 currently  9. Anemia  - Hgb has been drifting down from 10 --->8.2->7.7  - Ferritin 135, Iron 44, Sats  15% - Received IV Feraheme, nephro giving  ESA  10. CAD  - mild CAD on cath 2005  - Myoview 2021 no signs of ischemia - On statin + xarelto  11. Social  Referred to SW for assistance with financial difficulties.     Length of Stay: 12  Katherine Roan, MD  12/03/2020, 7:46 AM  Advanced Heart Failure Team Pager [phone] (M-F; 7a - 4p)  Please contact [facility] for night-coverage after hours (4p -7a ) and weekends on [email or web address]

## 2020-12-03 NOTE — Progress Notes (Addendum)
Patient ID: [name] [name], female   DOB: April 25, 1951, 70 y.o.   MRN: [id number]     Advanced Heart Failure Rounding Note  PCP-Cardiologist: No primary care provider on file.    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 Now admitted with worsening HF and AKI likely due to cardiorenal syndrome   Echo EF 30% with severe TR  Started on empiric DBA for suspected low output HF and cardiorenal syndrome + Lasix gtt.   Subjective:    On DBA 4. lasix at 30 mg/hr, acetazolamide and on metolazone 2.'5mg'$  BID.  6L out yesterday (has not been completely documented)  Denies CP, SOB, orthopnea or PND. Complaining of muscle soreness and cramping in arms and legs this morning.    Creatinine worsened to 4.25 from 3.5 yesterday   Objective:   Weight Range: 61.1 kg Body mass index is 24.62 kg/m.   Vital Signs:   Temp:  [97.5 F (36.4 C)-98.4 F (36.9 C)] 98.4 F (36.9 C) (03/07 0438) Pulse Rate:  [68-70] 70 (03/07 0438) Resp:  [18] 18 (03/07 0438) BP: (128-153)/(52-73) 153/53 (03/07 0438) SpO2:  [97 %-100 %] 100 % (03/07 0438) Weight:  [61.1 kg] 61.1 kg (03/07 0100) Last BM Date: 12/01/20  Weight change: Filed Weights   12/01/20 0001 12/02/20 0020 12/03/20 0100  Weight: 67.5 kg 60.7 kg 61.1 kg    Intake/Output:   Intake/Output Summary (Last 24 hours) at 12/03/2020 0746 Last data filed at 12/03/2020 0600 Gross per 24 hour  Intake 1044.61 ml  Output 4300 ml  Net -3255.39 ml      Physical Exam    Cardiac: Neck veins distended, normal rate and rhythm, clear s1 and s2, 3/6 TR, rubs or gallops, no LE edema, left bka Pulmonary: distant breath sounds, no rales, not in distress Abdominal: soft and nontender Psych: Alert, conversant, in good spirits   Telemetry   V-paced 70s Personally reviewed   Labs    CBC Recent Labs    12/01/20 0420 12/03/20 0423  WBC 6.2 6.3  HGB 7.8* 7.8*   HCT 23.9* 22.8*  MCV 87.9 87.0  PLT 122* 123456*   Basic Metabolic Panel Recent Labs    11/30/20 0933 12/01/20 0420 12/02/20 0308 12/03/20 0423  NA  --    < > 128* 129*  K  --    < > 4.3 4.2  CL  --    < > 88* 86*  CO2  --    < > 25 29  GLUCOSE  --    < > 100* 89  BUN  --    < > 85* 91*  CREATININE  --    < > 3.56* 4.25*  CALCIUM  --    < > 9.2 9.6  MG 2.2  --   --   --   PHOS  --    < > 7.1* 7.5*   < > = values in this interval not displayed.   Liver Function Tests Recent Labs    11/30/20 0933 12/01/20 0420 12/02/20 0308 12/03/20 0423  AST 37  --   --   --   ALT 24  --   --   --   ALKPHOS 92  --   --   --   BILITOT 1.5*  --   --   --   PROT 5.9*  --   --   --  ALBUMIN 2.7*   < > 2.7* 2.5*   < > = values in this interval not displayed.   No results for input(s): LIPASE, AMYLASE in the last 72 hours. Cardiac Enzymes No results for input(s): CKTOTAL, CKMB, CKMBINDEX, TROPONINI in the last 72 hours.  BNP: BNP (last 3 results) Recent Labs    06/20/20 0956 11/21/20 1105  BNP >4,500.0* >4,500.0*    ProBNP (last 3 results) No results for input(s): PROBNP in the last 8760 hours.   D-Dimer No results for input(s): DDIMER in the last 72 hours. Hemoglobin A1C No results for input(s): HGBA1C in the last 72 hours. Fasting Lipid Panel No results for input(s): CHOL, HDL, LDLCALC, TRIG, CHOLHDL, LDLDIRECT in the last 72 hours. Thyroid Function Tests No results for input(s): TSH, T4TOTAL, T3FREE, THYROIDAB in the last 72 hours.  Invalid input(s): FREET3  Other results:   Imaging    No results found.   Medications:     Scheduled Medications: . acetaZOLAMIDE  250 mg Oral BID  . atorvastatin  40 mg Oral Daily  . calcitRIOL  0.5 mcg Oral Daily  . darbepoetin (ARANESP) injection - NON-DIALYSIS  25 mcg Subcutaneous Q Fri-1800  . metolazone  5 mg Oral BID  . sevelamer carbonate  1,600 mg Oral TID WC    Infusions: . DOBUTamine 4 mcg/kg/min (12/02/20  1516)  . furosemide (LASIX) 200 mg in dextrose 5% 100 mL ('2mg'$ /mL) infusion 30 mg/hr (12/03/20 0436)    PRN Medications: acetaminophen **OR** acetaminophen, albuterol, diclofenac Sodium, hydrOXYzine, loperamide, polyethylene glycol    Assessment/Plan    1. A/C Systolic Heart Failure - Etiology unclear possibly from RV pacing?  Has St Jude PPM - Myoview 08/2020  Showed EF 30% with no signs of ischemia. Unable to cath with CKD. No chest pain.  -ECHO this admit with EF 30-35% RV normal. Severe TR - Brisk diuresis yesterday, creatinine worsened at 4.25.  On DBA 4, lasix 30 mg/hr acetazolimide, metolazone 2.5 mg BID.   --Decrease diuretics today.  Has had improvement in peripheral edema, breathing unlabored working with PT took frequent breaks but mostly limited by muscle pain, distended neck veins but in the setting of severe TR - Take for RHC today to assess hemodynamics/output - Poor candidate for dialysis  - Off Coreg w/ suspected low output.   - No dig/spir/arni with elevated creatinine.  - If improves will need to consider PM upgrade to BiV (to avoid RV pacing) and get closer look to see if RV lead is adherent to TV.    2.  CKD Stage IV - Creatinine baseline  2.9-3.3 - SCr worsened to 4.25, may be a little dry decrease diuretics - Suspect Contribution from Cardiorenal. Continue DBA  - Nephrology following. Suspect she would have a hard time tolerating iHD.   3. PAF - In NSR - On Xarelto  4. 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   5. PAD -H/O LBKA   6. Hyperthyroidism  - Thyroid US 11/12/20 with numerous small nodules  -11/22/20 TSH  0.014 Free T3 1.3   7. Severe TR -Noted on ECHO   8. Hyperkalemia  - K 5.6 in the setting of renal failure - Received lokelma  - K stabilized 4.2 currently  9. Anemia  - Hgb has been drifting down from 10 --->8.2->7.7  - Ferritin 135, Iron 44, Sats  15% - Received IV Feraheme, nephro giving  ESA  10. CAD  - mild CAD on cath 2005  - Myoview 2021 no signs of ischemia - On statin + xarelto  11. Social  Referred to SW for assistance with financial difficulties.     Length of Stay: 12  Katherine Roan, MD  12/03/2020, 7:46 AM  Advanced Heart Failure Team Pager [phone] (M-F; 7a - 4p)  Please contact [facility] for night-coverage after hours (4p -7a ) and weekends on [email or web address]  Patient seen and examined with the above-signed Advanced Practice Provider and/or Housestaff. I personally reviewed laboratory data, imaging studies and relevant notes. I independently examined the patient and formulated the important aspects of the plan. I have edited the note to reflect any of my changes or salient points. I have personally discussed the plan with the patient and/or family.  She continues to feel weak. C/o cramping. Denies SOB   Patient underwent RHC today on dobutamine 18mg/kg/min  RA = 11 RV = 54/12 PA = 58/19 (35) PCW = 21 (v = 38) Fick cardiac output/index = 7.8/4.8 PVR = 1.8 WU Ao sat = 99% PA sat = 73%, 73%  Assessment:  1. Biventricular HF with mild to moderate volume overload 2. Prominent v-waves in PCWP tracing c/w severe MR or diastolic dysfunction 3. High cardiac output on dobutamine  General:  Weak appearing. No resp difficulty HEENT: normal Neck: supple. JVP to jaw Carotids 2+ bilat; no bruits. No lymphadenopathy or thryomegaly appreciated. Cor: PMI nondisplaced. Regular rate & rhythm. No rubs, gallops or murmurs. Lungs: clear Abdomen: soft, nontender, nondistended. No hepatosplenomegaly. No bruits or masses. Good bowel sounds. Extremities: no cyanosis, clubbing, rash, 1+ edema s/p L BKA Neuro: alert & orientedx3, cranial nerves grossly intact. moves all 4 extremities w/o difficulty. Affect pleasant  RHC shows moderate persistent volume overload with normal/high CO on dobutamine in the face of rising  serum creatinine. So I suspect her intrinsic renal dysfunction is playing a significant role in her clinical deterioration in addition to her HF  I suspect this is as good as we can get her. Will hold lasix for now and continue dobutamine and see how she does. If we can stabilize her will try to wean dobutamine slowly but doubt she has the reserve to maintain. Agree with Palliative Care discussions.   DGlori Bickers MD  10:00 PM

## 2020-12-03 NOTE — Interval H&P Note (Signed)
History and Physical Interval Note:  12/03/2020 1:17 PM  [name] [name] [name]  has presented today for surgery, with the diagnosis of heart failure.  The various methods of treatment have been discussed with the patient and family. After consideration of risks, benefits and other options for treatment, the patient has consented to  Procedure(s): RIGHT HEART CATH (N/A) as a surgical intervention.  The patient's history has been reviewed, patient examined, no change in status, stable for surgery.  I have reviewed the patient's chart and labs.  Questions were answered to the patient's satisfaction.     Daniel Bensimhon

## 2020-12-03 NOTE — Progress Notes (Signed)
Pt was picked up for cath lab. Valuables and jewelry were collected by primary RN and primary nurse tech [facility] RN, Elza Rafter NT). Contents of purse were verified and listed at the nurses' station. All money, cards and jewelry were then confirmed and placed in security bag and placed at the nurses' station.

## 2020-12-03 NOTE — Progress Notes (Signed)
OT Cancellation Note  Patient Details Name: [name] [name] MRN: [id number] DOB: [birth year]   Cancelled Treatment:    Reason Eval/Treat Not Completed: Patient at procedure or test/ unavailable;Other (comment) Pt out of room for heart cath, will check back as time allows and pt medically appropriate.   Corinne Ports K., COTA/L Acute Rehabilitation Services [phone]   Precious Haws 12/03/2020, 2:08 PM

## 2020-12-03 NOTE — Progress Notes (Signed)
Physical Therapy Treatment Patient Details Name: [name] [name] MRN: [id number] DOB: 1951/05/21 Today's Date: 12/03/2020    History of Present Illness Pt is a 70 y/o female admitted secondary to CHF exacerbation. Awaiting possible RHC 12/03/20? PMH includes s/p L BKA, CKD, DM, a fib, HTN, CHF, CAD, and s/p pacemaker.    PT Comments    Patient progressing slowly towards PT goals. Swelling is improved in BLEs however noted to be tender to touch bilaterally. Worked on multiple SPT to/from [facility] and w/c during session with Min guard assist as well as propelling w/c for there ex and cardiovascular endurance with supervision. Pt fatigues quickly when propelling w/c needing multiple rest breaks. Reports LUE soreness/tightness impacting ability to propel w/c long distances. Frustrated she has not had her [place] yet and eager to return home. Will continue to follow and progress as tolerated.   Follow Up Recommendations  Home health PT     Equipment Recommendations  None recommended by PT    Recommendations for Other Services       Precautions / Restrictions Precautions Precautions: Fall;Other (comment) Precaution Comments: Lft BKA    Mobility  Bed Mobility               General bed mobility comments: Sitting on BSC upon PT arrival.    Transfers Overall transfer level: Needs assistance Equipment used: None Transfers: Sit to/from [employer] Transfers Sit to Stand: Min guard Stand pivot transfers: Min guard       General transfer comment: SPT BSC to bed, SPT bed to/from w/c with Min guard assist and supporting w/c. Slow but no assist needed. Reports stiffness and soreness in RLE/LUE. Stood from [facility] x1, from EOB x1, from w/c x1.  Ambulation/Gait             General Gait Details: Does not ambulate   Theme park manager mobility: Yes Wheelchair propulsion: Both upper extremities Wheelchair parts:  Supervision/cueing Distance: 29' + 30' + 30' Wheelchair Assistance Details (indicate cue type and reason): Some difficulty due to pain/tightness in LUE, fatigues needing rest breaks.  Modified Rankin (Stroke Patients Only)       Balance Overall balance assessment: Needs assistance Sitting-balance support: Feet unsupported;No upper extremity supported Sitting balance-Leahy Scale: Good     Standing balance support: During functional activity Standing balance-Leahy Scale: Poor Standing balance comment: Reliant on UE support in standing. Able to perform pericare without assist.                            Cognition Arousal/Alertness: Awake/alert Behavior During Therapy: WFL for tasks assessed/performed Overall Cognitive Status: Within Functional Limits for tasks assessed                                 General Comments: Likely close to baseline, upset that she has not had her procedure done yet and wants to go home.      Exercises      General Comments General comments (skin integrity, edema, etc.): Some swelling noted in left residual limb and tender to touch BLEs.      Pertinent Vitals/Pain Pain Assessment: Faces Faces Pain Scale: Hurts little more Pain Location: LEs and LUE tender wtih movement/touch Pain Descriptors / Indicators: Tender;Tightness;Sore Pain Intervention(s): Monitored during session;Repositioned;Limited activity within patient's tolerance  Home Living                      Prior Function            PT Goals (current goals can now be found in the care plan section) Progress towards PT goals: Progressing toward goals    Frequency    Min 3X/week      PT Plan Current plan remains appropriate    Co-evaluation              AM-PAC PT "6 Clicks" Mobility   Outcome Measure  Help needed turning from your back to your side while in a flat bed without using bedrails?: None Help needed moving from lying on  your back to sitting on the side of a flat bed without using bedrails?: None Help needed moving to and from a bed to a chair (including a wheelchair)?: A Little Help needed standing up from a chair using your arms (e.g., wheelchair or bedside chair)?: A Little Help needed to walk in hospital room?: A Lot Help needed climbing 3-5 steps with a railing? : A Lot 6 Click Score: 18    End of Session Equipment Utilized During Treatment: Gait belt Activity Tolerance: Patient tolerated treatment well Patient left: in bed;with call bell/phone within reach Nurse Communication: Mobility status PT Visit Diagnosis: Unsteadiness on feet (R26.81);Muscle weakness (generalized) (M62.81);Pain Pain - Right/Left: Left Pain - part of body: Shoulder     Time: [id number] PT Time Calculation (min) (ACUTE ONLY): 30 min  Charges:  $Therapeutic Exercise: 8-22 mins $Therapeutic Activity: 8-22 mins                     Marisa Severin, PT, DPT Acute Rehabilitation Services Pager [phone] Office [place] 12/03/2020, 8:59 AM
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
## 2020-12-03 NOTE — Progress Notes (Signed)
   Subjective:   No acute overnight events.  Still having pain in left arm and in stomach. States that she has some phlegm in her throat that she is trying to cough up.   Objective:  Vital signs in last 24 hours: Vitals:   12/02/20 2011 12/03/20 0046 12/03/20 0100 12/03/20 0438  BP: (!) 150/73 (!) 137/52  (!) 153/53  Pulse: 70 69  70  Resp: '18 18  18  '$ Temp: (!) 97.5 F (36.4 C) 98.3 F (36.8 C)  98.4 F (36.9 C)  TempSrc: Oral Oral  Oral  SpO2: 100% 97%  100%  Weight:   61.1 kg     Intake/Output Summary (Last 24 hours) at 12/03/2020 1222 Last data filed at 12/03/2020 0600 Gross per 24 hour  Intake 924.61 ml  Output 3300 ml  Net -2375.39 ml    Physical Exam: General: ill-appearing elderly female, lying in bed, NAD. CV: ventricle-paced rate, regular rhythm. Pulm: CTABL, no adventitious sounds noted. Abdomen: distended, mild diffuse TTP, abdominal wall edema down to sacrum. MSK: left BKA, UNNA boot in place on RLE. LE edema looks improved bilaterally. Skin: warm and dry Neuro: AAOx3, no focal deficits noted.  Assessment/Plan:  Principal Problem:   Acute exacerbation of CHF (congestive heart failure) (HCC) Active Problems:   Complete heart block (HCC)   CKD stage 4 secondary to hypertension ([place])   Diabetes mellitus (HCC)   Paroxysmal atrial fibrillation (HCC)  [name] [name] is a 70 year old female with PMH of CAD w/sinus arrest now s/p pacemaker, HFrEF (EF 30-35%), paroxysmal atrial fibrillation on eliquis, CKD stage IV, and normocytic anemia presenting with increasing SHOB and weight gain, admitted for HFrEF exacerbation.  HFrEF Exacerbation CAD, Hx of sinus arrest s/p pacemaker Moderate MR, Severe TR Paroxysmal Atrial Fibrillation Had 4.3L UOP over past 24 hours with aggressive diuresis with lasix infusion '@30cc'$ /hr, metolazone '5mg'$  BID, diamox, and dobutamine infusion. Still has significant abdominal wall edema, although lower extremity edema is much improved.  Nephrology and Advanced HF team following due to impaired kidney function and possible component of cardiogenic shock. Plan for right heart cath today by Advanced HF team to assess hemodynamics/output. Eliquis dose is being held at this time in anticipation of RHC. Palliative care consult placed for goals of care discussions. -greatly appreciate nephrology, advanced HF, and palliative care assistance -decreasing diuretics today as per Advanced HF -f/u right heart cath results -closely monitor UOP, strict I/O's, daily weights   AKI on CKD Stage IV Hyperkalemia Hyponatremia Hyperphosphatemia Likely component of cardiorenal syndrome. Worsening kidney function in the setting of aggressive diuresis. May be a little dry, so will decrease diuretic dose. Unfortunately, she is a poor candidate for dialysis due to multiple comorbidities as per nephrology. -sodium 129 >> due to volume overload -potassium stable at 4.2 this AM -phosphate 7.5 this AM >> continue sevelamer '1600mg'$  TID with meals -trend renal function panel, avoid nephrotoxic medications   Prior to Admission Living Arrangement: Home Anticipated Discharge Location: TBD Barriers to Discharge: continued medical management Dispo: Anticipated discharge in approximately 3-4 day(s).   Virl Axe, MD 12/03/2020, 12:22 PM Pager: [phone] After 5pm on weekdays and 1pm on weekends: On Call pager [phone]

## 2020-12-03 NOTE — Progress Notes (Signed)
Pt removed blood pressure cuff and refused all sequential vitals following cath. Pt is stable. No distress noted. NO needs or concerns at this time. No bleeding noted. Will continue to monitor.

## 2020-12-03 NOTE — Care Management Important Message (Signed)
Important Message  Patient Details  Name: [name] [name] MRN: [id number] Date of Birth: Jan 21, 1951   Medicare Important Message Given:  Yes     Shelda Altes 12/03/2020, 9:59 AM

## 2020-12-04 ENCOUNTER — Encounter (HOSPITAL_COMMUNITY): Payer: Self-pay | Admitting: Internal Medicine

## 2020-12-04 ENCOUNTER — Other Ambulatory Visit: Payer: Self-pay | Admitting: Internal Medicine

## 2020-12-04 DIAGNOSIS — I442 Atrioventricular block, complete: Secondary | ICD-10-CM | POA: Diagnosis not present

## 2020-12-04 DIAGNOSIS — I5082 Biventricular heart failure: Secondary | ICD-10-CM | POA: Diagnosis not present

## 2020-12-04 DIAGNOSIS — I5023 Acute on chronic systolic (congestive) heart failure: Secondary | ICD-10-CM | POA: Diagnosis not present

## 2020-12-04 DIAGNOSIS — N179 Acute kidney failure, unspecified: Secondary | ICD-10-CM | POA: Diagnosis not present

## 2020-12-04 DIAGNOSIS — I509 Heart failure, unspecified: Secondary | ICD-10-CM

## 2020-12-04 DIAGNOSIS — I130 Hypertensive heart and chronic kidney disease with heart failure and stage 1 through stage 4 chronic kidney disease, or unspecified chronic kidney disease: Secondary | ICD-10-CM | POA: Diagnosis not present

## 2020-12-04 DIAGNOSIS — I129 Hypertensive chronic kidney disease with stage 1 through stage 4 chronic kidney disease, or unspecified chronic kidney disease: Secondary | ICD-10-CM | POA: Diagnosis not present

## 2020-12-04 DIAGNOSIS — N184 Chronic kidney disease, stage 4 (severe): Secondary | ICD-10-CM | POA: Diagnosis not present

## 2020-12-04 LAB — GLUCOSE, CAPILLARY
Glucose-Capillary: 102 mg/dL — ABNORMAL HIGH (ref 70–99)
Glucose-Capillary: 143 mg/dL — ABNORMAL HIGH (ref 70–99)
Glucose-Capillary: 123 mg/dL — ABNORMAL HIGH (ref 70–99)

## 2020-12-04 LAB — RENAL FUNCTION PANEL
Albumin: 2.6 g/dL — ABNORMAL LOW (ref 3.5–5.0)
Anion gap: 15 (ref 5–15)
BUN: 101 mg/dL — ABNORMAL HIGH (ref 8–23)
CO2: 29 mmol/L (ref 22–32)
Calcium: 9.4 mg/dL (ref 8.9–10.3)
Chloride: 87 mmol/L — ABNORMAL LOW (ref 98–111)
Creatinine, Ser: 4.52 mg/dL — ABNORMAL HIGH (ref 0.44–1.00)
GFR, Estimated: 10 mL/min — ABNORMAL LOW (ref >60)
Glucose, Bld: 95 mg/dL (ref 70–99)
Phosphorus: 8.1 mg/dL — ABNORMAL HIGH (ref 2.5–4.6)
Potassium: 3.8 mmol/L (ref 3.5–5.1)
Sodium: 131 mmol/L — ABNORMAL LOW (ref 135–145)

## 2020-12-04 LAB — CBC
HCT: 24.9 % — ABNORMAL LOW (ref 36.0–46.0)
Hemoglobin: 8.1 g/dL — ABNORMAL LOW (ref 12.0–15.0)
MCH: 29 pg (ref 26.0–34.0)
MCHC: 32.5 g/dL (ref 30.0–36.0)
MCV: 89.2 fL (ref 80.0–100.0)
Platelets: 148 10*3/uL — ABNORMAL LOW (ref 150–400)
RBC: 2.79 MIL/uL — ABNORMAL LOW (ref 3.87–5.11)
RDW: 16.9 % — ABNORMAL HIGH (ref 11.5–15.5)
WBC: 6.5 10*3/uL (ref 4.0–10.5)
nRBC: 0 % (ref 0.0–0.2)

## 2020-12-04 MED ORDER — HYDROMORPHONE HCL 2 MG PO TABS
2.0000 mg | ORAL_TABLET | ORAL | Status: DC | PRN
  Administered 2020-12-06 (×3): 2 mg via ORAL
  Filled 2020-12-04 (×3): qty 1

## 2020-12-04 MED ORDER — TORSEMIDE 20 MG PO TABS
60.0000 mg | ORAL_TABLET | Freq: Two times a day (BID) | ORAL | Status: DC
  Administered 2020-12-04: 60 mg via ORAL
  Filled 2020-12-04: qty 3

## 2020-12-04 MED ORDER — HYDROMORPHONE HCL 1 MG/ML IJ SOLN: 1.0000 mg | INTRAMUSCULAR | Status: DC | PRN

## 2020-12-04 MED ORDER — APIXABAN 2.5 MG PO TABS
2.5000 mg | ORAL_TABLET | Freq: Two times a day (BID) | ORAL | Status: DC
  Administered 2020-12-04 – 2020-12-05 (×3): 2.5 mg via ORAL
  Filled 2020-12-04 (×3): qty 1

## 2020-12-04 MED ORDER — DOBUTAMINE IN D5W 4-5 MG/ML-% IV SOLN
2.5000 ug/kg/min | INTRAVENOUS | Status: DC
  Administered 2020-12-04 – 2020-12-06 (×2): 2.5 ug/kg/min via INTRAVENOUS
  Filled 2020-12-04 (×2): qty 250

## 2020-12-04 NOTE — Progress Notes (Addendum)
Patient ID: [name] [name], female   DOB: [birth year], 70 y.o.   MRN: [id number] S: Patient feeling better today.  Heart catheterization demonstrated decent volume status.  Significant heart failure as expected O:BP (!) 155/56 (BP Location: Right Arm) Comment: recheck  Pulse 70   Temp 98.2 F (36.8 C) (Oral)   Resp 18   Wt 57.7 kg   SpO2 100%   BMI 23.27 kg/m   Intake/Output Summary (Last 24 hours) at 12/04/2020 1001 Last data filed at 12/04/2020 0450 Gross per 24 hour  Intake 643.04 ml  Output 2701 ml  Net -2057.96 ml   Intake/Output: I/O last 3 completed shifts: In: 1012.9 [P.O.:720; I.V.:292.9] Out: 5901 [Urine:5900; Stool:1]  Intake/Output this shift:  No intake/output data recorded. Weight change: -3.357 kg Gen: Appears chronically ill, sitting on the side of bed CVS: Normal rate, distended neck veins Resp: Bilateral chest rise, mild increased work of breathing Abd: Mild distention Ext: Lower extremity edema trace  Recent Labs  Lab 11/29/20 0407 11/29/20 1715 11/30/20 [id number] 11/30/20 0933 12/01/20 0420 12/02/20 0308 12/03/20 0423 12/03/20 1403 12/03/20 1404 12/03/20 1408 12/04/20 0402  NA 130* 133* 131*  --  132* 128* 129* 131* 135 131* 131*  K 4.9 4.7 5.6*  --  4.7 4.3 4.2 3.8 3.5 3.8 3.8  CL 95* 97* 95*  --  94* 88* 86*  --   --   --  87*  CO2 24 22 21*  --  '24 25 29  '$ --   --   --  29  GLUCOSE 115* 117* 66*  --  93 100* 89  --   --   --  95  BUN 73* 75* 87*  --  83* 85* 91*  --   --   --  101*  CREATININE 3.31* 3.48* 3.55*  --  3.63* 3.56* 4.25*  --   --   --  4.52*  ALBUMIN  --   --   --  2.7* 2.6* 2.7* 2.5*  --   --   --  2.6*  CALCIUM 8.8* 8.9 8.9  --  9.3 9.2 9.6  --   --   --  9.4  PHOS  --   --   --   --  7.2* 7.1* 7.5*  --   --   --  8.1*  AST  --   --   --  37  --   --   --   --   --   --   --   ALT  --   --   --  24  --   --   --   --   --   --   --    Liver Function Tests: Recent Labs  Lab 11/30/20 0933 12/01/20 0420 12/02/20 0308  12/03/20 0423 12/04/20 0402  AST 37  --   --   --   --   ALT 24  --   --   --   --   ALKPHOS 92  --   --   --   --   BILITOT 1.5*  --   --   --   --   PROT 5.9*  --   --   --   --   ALBUMIN 2.7*   < > 2.7* 2.5* 2.6*   < > = values in this interval not displayed.   No results for input(s): LIPASE, AMYLASE in the last 168 hours. No results for input(s):  AMMONIA in the last 168 hours. CBC: Recent Labs  Lab 11/28/20 0347 11/29/20 2256 11/30/20 0719 12/01/20 0420 12/03/20 0423 12/03/20 1403 12/03/20 1404 12/03/20 1408 12/04/20 0402  WBC 5.1 7.6 6.6 6.2 6.3  --   --   --  6.5  NEUTROABS 2.8  --   --   --   --   --   --   --   --   HGB 8.2* 8.1* 7.7* 7.8* 7.8*   < > 8.5* 8.8* 8.1*  HCT 24.7* 25.4* 23.6* 23.9* 22.8*   < > 25.0* 26.0* 24.9*  MCV 89.2 90.1 88.7 87.9 87.0  --   --   --  89.2  PLT 100* 107* 114* 122* 146*  --   --   --  148*   < > = values in this interval not displayed.   Cardiac Enzymes: No results for input(s): CKTOTAL, CKMB, CKMBINDEX, TROPONINI in the last 168 hours. CBG: Recent Labs  Lab 12/03/20 0528 12/03/20 1217 12/03/20 1638 12/03/20 2112 12/04/20 0605  GLUCAP 85 86 131* 121* 102*    Iron Studies:  No results for input(s): IRON, TIBC, TRANSFERRIN, FERRITIN in the last 72 hours. Studies/Results: CARDIAC CATHETERIZATION  Result Date: 12/03/2020 Findings: On dobutamine 106mg/kg/min RA = 11 RV = 54/12 PA = 58/19 (35) PCW = 21 (v = 38) Fick cardiac output/index = 7.8/4.8 PVR = 1.8 WU Ao sat = 99% PA sat = 73%, 73% Assessment: 1. Biventricular HF with mild to moderate volume overload 2. Prominent v-waves in PCWP tracing c/w severe MR or diastolic dysfunction 3. High cardiac output on dobutamine Plan/Discussion: I think this is about as good as we can get her. Will hold lasix. Wean DBA slowly. Follow creatinine. Continue palliative discussions. DGlori Bickers MD 2:15 PM  . apixaban  5 mg Oral BID  . atorvastatin  40 mg Oral Daily  . calcitRIOL  0.5 mcg  Oral Daily  . darbepoetin (ARANESP) injection - NON-DIALYSIS  25 mcg Subcutaneous Q Fri-1800  . sevelamer carbonate  1,600 mg Oral TID WC  . sodium chloride flush  3 mL Intravenous Q12H  . sodium chloride flush  3 mL Intravenous Q12H  . torsemide  60 mg Oral BID    BMET    Component Value Date/Time   NA 131 (L) 12/04/2020 0402   NA 142 11/08/2020 1039   K 3.8 12/04/2020 0402   CL 87 (L) 12/04/2020 0402   CO2 29 12/04/2020 0402   GLUCOSE 95 12/04/2020 0402   BUN 101 (H) 12/04/2020 0402   BUN 21 11/08/2020 1039   CREATININE 4.52 (H) 12/04/2020 0402   CREATININE 2.26 (H) 04/07/2018 1330   CALCIUM 9.4 12/04/2020 0402   CALCIUM 9.3 08/28/2011 1648   GFRNONAA 10 (L) 12/04/2020 0402   GFRNONAA 29 (L) 11/10/2012 1115   GFRAA 28 (L) 11/08/2020 1039   GFRAA 33 (L) 11/10/2012 1115   CBC    Component Value Date/Time   WBC 6.5 12/04/2020 0402   RBC 2.79 (L) 12/04/2020 0402   HGB 8.1 (L) 12/04/2020 0402   HGB 11.7 11/25/2018 0931   HCT 24.9 (L) 12/04/2020 0402   HCT 35.8 11/25/2018 0931   PLT 148 (L) 12/04/2020 0402   PLT 146 (L) 11/25/2018 0931   MCV 89.2 12/04/2020 0402   MCV 93 11/25/2018 0931   MCH 29.0 12/04/2020 0402   MCHC 32.5 12/04/2020 0402   RDW 16.9 (H) 12/04/2020 0402   RDW 13.1 11/25/2018 0931   LYMPHSABS 1.2  11/28/2020 0347   LYMPHSABS 2.0 11/25/2018 0931   MONOABS 0.8 11/28/2020 0347   EOSABS 0.3 11/28/2020 0347   EOSABS 0.7 (H) 11/25/2018 0931   BASOSABS 0.0 11/28/2020 0347   BASOSABS 0.1 11/25/2018 0931    Assessment/Plan: 1. AKI/CKD stage IV in setting of decompensated systolic and diastolic CHF with escalating doses of IV lasix. Likely cardiorenal syndrome. Creatinine continues to be slightly worse today. 1. Switch to oral diuretic today 2. Maintain current volume status 3. Monitor creatinine closely; hopefully will stabilize in the coming days 4. Long discussion with the patient today.  Also discussed with palliative care yesterday.  She is DNR but  wants to try dialysis if needed.  Hopefully kidney function will recover soon but would consider a time trial of dialysis if her kidney failure worsened.  Would need to be off dobutamine first. 2. Acute on chronic systolic and diastolic CHF- profound volume overload. EF 30% with severe TR.now significantly improved volume status.  Heart failure managing.  Continues on dobutamine.  Hoping to see improvement in kidney function before weaning 3. Complete heart block - s/p PPM, high percentage of RV pacing per Cardiology 4. P. Afib - on xarelto, now in NSR.  5. Severe protein malnutrition -encourage p.o. intake 6. Moderate MR and severe TR- as above recommend heart failure team consultation 7. Hyponatremia - due to volume overload.Stable 8. Hyperphosphatemia: Continue sevelamer 3 times daily 9. Anemia of CKD stage IV- cont to follow and transfuse prn.Will give IV feraheme due to low TSAT. Started ESA. 10. Disposition - poor overall prognosis as above.  Palliative care assisting.  Appreciate help

## 2020-12-04 NOTE — Progress Notes (Signed)
OT Cancellation Note  Patient Details Name: Tarah Stalvey MRN: [id number] DOB: 08/30/1951   Cancelled Treatment:    Reason Eval/Treat Not Completed: Patient declined, no reason specified (Pt stating "I've been getting up and down all this time and I want to sleep." Not able to assist pt with bed mobility as pt wanting to rest. OT to continue to follow for OT intervention.)   Jefferey Pica, OTR/L [place] Pager: [phone] Office: [phone]   Ruthe Roemer C 12/04/2020, 5:21 PM

## 2020-12-04 NOTE — Progress Notes (Addendum)
Patient ID: [name] [name], female   DOB: [birth year], 70 y.o.   MRN: [id number]     Advanced Heart Failure Rounding Note  PCP-Cardiologist: No primary care provider on file.    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 Now admitted with worsening HF and AKI likely due to cardiorenal syndrome  Echo EF 30% with severe TR  Started on empiric DBA for suspected low output HF and cardiorenal syndrome + Lasix gtt.   Subjective:    Decreased diuretics yesterday but patient still with 3.2L output net neg 2.5L.  Her breathing has improved, Cr slightly worse today 4.52 from 4.25, BUN now 100.   Cramping and muscle pain with significant improvement compared to yesterday.    Patient underwent RHC 3/7 on dobutamine 69mg/kg/min  RA = 11 RV = 54/12 PA = 58/19 (35) PCW = 21 (v = 38) Fick cardiac output/index = 7.8/4.8 PVR = 1.8 WU Ao sat = 99% PA sat = 73%, 73%  Assessment:  1. Biventricular HF with mild to moderate volume overload 2. Prominent v-waves in PCWP tracing c/w severe MR or diastolic dysfunction 3. High cardiac output on dobutamine  Objective:   Weight Range: 57.7 kg Body mass index is 23.27 kg/m.   Vital Signs:   Temp:  [97.4 F (36.3 C)-98.2 F (36.8 C)] 98.2 F (36.8 C) (03/08 0333) Pulse Rate:  [63-89] 70 (03/08 0333) Resp:  [11-18] 18 (03/08 0333) BP: (108-165)/(35-59) 155/56 (03/08 0400) SpO2:  [100 %] 100 % (03/08 0333) Weight:  [57.7 kg] 57.7 kg (03/08 0333) Last BM Date: 12/03/20  Weight change: Filed Weights   12/02/20 0020 12/03/20 0100 12/04/20 0333  Weight: 60.7 kg 61.1 kg 57.7 kg    Intake/Output:   Intake/Output Summary (Last 24 hours) at 12/04/2020 0841 Last data filed at 12/04/2020 0450 Gross per 24 hour  Intake 643.04 ml  Output 3201 ml  Net -2557.96 ml      Physical Exam    Cardiac:  normal rate and rhythm, clear s1 and s2, 3/6 TR, rubs  or gallops, no LE edema, left bka Pulmonary: RLL rales otherwise distant breath sounds, not in distress Abdominal: soft, no guarding  Psych: Alert, conversant, in good spirits   Telemetry   V-paced 70s Personally reviewed   Labs    CBC Recent Labs    12/03/20 0423 12/03/20 1403 12/03/20 1408 12/04/20 0402  WBC 6.3  --   --  6.5  HGB 7.8*   < > 8.8* 8.1*  HCT 22.8*   < > 26.0* 24.9*  MCV 87.0  --   --  89.2  PLT 146*  --   --  148*   < > = values in this interval not displayed.   Basic Metabolic Panel Recent Labs    12/03/20 0423 12/03/20 1403 12/03/20 1408 12/04/20 0402  NA 129*   < > 131* 131*  K 4.2   < > 3.8 3.8  CL 86*  --   --  87*  CO2 29  --   --  29  GLUCOSE 89  --   --  95  BUN 91*  --   --  101*  CREATININE 4.25*  --   --  4.52*  CALCIUM 9.6  --   --  9.4  PHOS 7.5*  --   --  8.1*   < > = values  in this interval not displayed.   Liver Function Tests Recent Labs    12/03/20 0423 12/04/20 0402  ALBUMIN 2.5* 2.6*   No results for input(s): LIPASE, AMYLASE in the last 72 hours. Cardiac Enzymes No results for input(s): CKTOTAL, CKMB, CKMBINDEX, TROPONINI in the last 72 hours.  BNP: BNP (last 3 results) Recent Labs    06/20/20 0956 11/21/20 1105  BNP >4,500.0* >4,500.0*    ProBNP (last 3 results) No results for input(s): PROBNP in the last 8760 hours.   D-Dimer No results for input(s): DDIMER in the last 72 hours. Hemoglobin A1C No results for input(s): HGBA1C in the last 72 hours. Fasting Lipid Panel No results for input(s): CHOL, HDL, LDLCALC, TRIG, CHOLHDL, LDLDIRECT in the last 72 hours. Thyroid Function Tests No results for input(s): TSH, T4TOTAL, T3FREE, THYROIDAB in the last 72 hours.  Invalid input(s): FREET3  Other results:   Imaging    CARDIAC CATHETERIZATION  Result Date: 12/03/2020 Findings: On dobutamine 41mg/kg/min RA = 11 RV = 54/12 PA = 58/19 (35) PCW = 21 (v = 38) Fick cardiac output/index = 7.8/4.8 PVR = 1.8  WU Ao sat = 99% PA sat = 73%, 73% Assessment: 1. Biventricular HF with mild to moderate volume overload 2. Prominent v-waves in PCWP tracing c/w severe MR or diastolic dysfunction 3. High cardiac output on dobutamine Plan/Discussion: I think this is about as good as we can get her. Will hold lasix. Wean DBA slowly. Follow creatinine. Continue palliative discussions. DGlori Bickers MD 2:15 PM    Medications:     Scheduled Medications: . apixaban  5 mg Oral BID  . atorvastatin  40 mg Oral Daily  . calcitRIOL  0.5 mcg Oral Daily  . darbepoetin (ARANESP) injection - NON-DIALYSIS  25 mcg Subcutaneous Q Fri-1800  . sevelamer carbonate  1,600 mg Oral TID WC  . sodium chloride flush  3 mL Intravenous Q12H  . sodium chloride flush  3 mL Intravenous Q12H    Infusions: . sodium chloride    . DOBUTamine 4 mcg/kg/min (12/02/20 1516)    PRN Medications: sodium chloride, acetaminophen, albuterol, diclofenac Sodium, hydrOXYzine, loperamide, ondansetron (ZOFRAN) IV, polyethylene glycol, sodium chloride flush    Assessment/Plan    1. A/C Systolic Heart Failure - Etiology unclear possibly from RV pacing?  Has St Jude PPM - Myoview 08/2020  Showed EF 30% with no signs of ischemia. Unable to cath with CKD. No chest pain.  -ECHO this admit with EF 30-35% RV normal. Severe TR - Continue dobutamine at 4 today - Switch to maintenance oral diuretic today - Poor candidate for dialysis  - Off Coreg w/ suspected low output.   - No dig/spir/arni with elevated creatinine.   2.  CKD Stage IV - Creatinine baseline  2.9-3.3 - Cr slightly worse at 4.52 today, she has low oncotic pressure, also may not be tolerating diuresis due to intrinsic renal disease, continue inotropic support and see if she can stabilize  - Nephrology following. They also feel she would have a hard time tolerating iHD.   3. PAF - In NSR - On eliquis  4. CHB and has ST jude PPM  - Followed by EP in the community  - Offered  upgrade due high percentage of RV pacing.   5. PAD -H/O LBKA   6. Hyperthyroidism  - Thyroid [place] with numerous small nodules  -11/22/20 TSH  0.014 Free T3 1.3   7. Severe TR -Noted on ECHO   8. Hyperkalemia  -  K 5.6 in the setting of renal failure - Received lokelma  - K stabilized 3.8 currently  9. Anemia  - Hgb has been drifting down from 10 ->8.2->7.7  - Ferritin 135, Iron 44, Sats 15% - Received IV Feraheme, nephro giving ESA  10. CAD  - mild CAD on cath 2005  - Myoview 2021 no signs of ischemia - On statin + eliquis  11. Social  Referred to SW for assistance with financial difficulties.    Length of Stay: [place], MD  12/04/2020, 8:41 AM  Advanced Heart Failure Team Pager [phone] (M-F; [place])  Please contact [facility] for night-coverage after hours (4p -7a ) and weekends on [email or web address]   Patient seen and examined with the above-signed Advanced Practice Provider and/or Housestaff. I personally reviewed laboratory data, imaging studies and relevant notes. I independently examined the patient and formulated the important aspects of the plan. I have edited the note to reflect any of my changes or salient points. I have personally discussed the plan with the patient and/or family.  She feels better today. Denies SON, orthopnea or PND. Wants to go home. Creatinine slightly worse despite ongoing DBA support   General: Sitting in WC No resp difficulty HEENT: normal Neck: supple. JVP 9-10 Carotids 2+ bilat; no bruits. No lymphadenopathy or thryomegaly appreciated. Cor: PMI nondisplaced. Regular rate & rhythm. No rubs, gallops or murmurs. Lungs: clear Abdomen: soft, nontender, nondistended. No hepatosplenomegaly. No bruits or masses. Good bowel sounds. Extremities: no cyanosis, clubbing, rash, 2+ edema into thigh s/p L BXA Neuro: alert & orientedx3, cranial nerves grossly intact. moves all 4 extremities w/o difficulty. Affect  pleasant  RHC results reviewed with her. I think options are very limited and 32-monthprognosis quite guarded. Hopefully renal function will stabilize. From a HF perspective we will start to wean DBA and try to get her on stable dose of oral diuretics with hopes of keeping as much fluid off as possible without straining her kidneys further. Appreciate IM, Renal and Palliative Care input. Given the severity of her HF, I do not see anyway that she will tolerate iHD for any length of time.   DGlori Bickers MD  1:21 PM

## 2020-12-04 NOTE — Progress Notes (Signed)
Physical Therapy Treatment Patient Details Name: [name] [name] MRN: [id number] DOB: 04-08-1951 Today's Date: 12/04/2020    History of Present Illness Pt is a 70 y/o female admitted secondary to CHF exacerbation. s/p RHC 12/03/20. PMH includes s/p L BKA, CKD, DM, a fib, HTN, CHF, CAD, and s/p pacemaker.    PT Comments    Patient progressing well towards PT goals. Eager and adamant about wanting to go home. Worked on w/c mobility and pt better able to tolerate propelling w/c with BUEs and RLE this date. Able to tolerate propelling w/c forward without less rest breaks today and no cues needed. Pt continues to have cognitive deficits relating to memory and awareness of medical situation but likely close to baseline. Will continue to follow and progress.   Follow Up Recommendations  Home health PT;Supervision - Intermittent     Equipment Recommendations  None recommended by PT    Recommendations for Other Services       Precautions / Restrictions Precautions Precautions: Fall;Other (comment) Precaution Comments: Lft BKA    Mobility  Bed Mobility               General bed mobility comments: Up in w/c upon PT arrival.    Transfers                    Ambulation/Gait                 Secretary/administrator Wheelchair mobility: Yes Wheelchair propulsion: Both upper extremities;Right lower extremity Wheelchair parts: Independent Distance: 125' + 21' Wheelchair Assistance Details (indicate cue type and reason): Pt using BUEs and RLE vs just RUE/RLE, no assist needed today. Fatigues less today but still needs rest breaks.  Modified Rankin (Stroke Patients Only)       Balance Overall balance assessment: Needs assistance Sitting-balance support: Feet supported;No upper extremity supported Sitting balance-Leahy Scale: Good                                      Cognition Arousal/Alertness:  Awake/alert Behavior During Therapy: WFL for tasks assessed/performed Overall Cognitive Status: History of cognitive impairments - at baseline                                 General Comments: Pt with poor memory; not remembering heart cath yesterday or if she ordered lunch. Wants to go home. Likely at baseline.      Exercises      General Comments General comments (skin integrity, edema, etc.): Swelling better in left residual limb.      Pertinent Vitals/Pain Pain Assessment: Faces Faces Pain Scale: Hurts little more Pain Location: LUE Pain Descriptors / Indicators: Sore Pain Intervention(s): Monitored during session;Repositioned    Home Living                      Prior Function            PT Goals (current goals can now be found in the care plan section) Progress towards PT goals: Progressing toward goals    Frequency    Min 3X/week      PT Plan Current plan remains appropriate    Co-evaluation  AM-PAC PT "6 Clicks" Mobility   Outcome Measure  Help needed turning from your back to your side while in a flat bed without using bedrails?: None Help needed moving from lying on your back to sitting on the side of a flat bed without using bedrails?: None Help needed moving to and from a bed to a chair (including a wheelchair)?: A Little Help needed standing up from a chair using your arms (e.g., wheelchair or bedside chair)?: A Little Help needed to walk in hospital room?: A Lot Help needed climbing 3-5 steps with a railing? : Total 6 Click Score: 17    End of Session   Activity Tolerance: Patient tolerated treatment well Patient left: in chair;with call bell/phone within reach (in personal w/c) Nurse Communication: Mobility status PT Visit Diagnosis: Unsteadiness on feet (R26.81);Muscle weakness (generalized) (M62.81)     Time: [id number] PT Time Calculation (min) (ACUTE ONLY): 23 min  Charges:  $Therapeutic  Exercise: 23-37 mins                     Marisa Severin, PT, DPT Acute Rehabilitation Services Pager [phone] Office [phone]       Marguarite Arbour A Sabra Heck 12/04/2020, 12:55 PM

## 2020-12-04 NOTE — Progress Notes (Signed)
   Subjective:   No acute overnight events, aside from completion of right heart catheter.  She reports feeling good today, although she understands that her health has taken a bad turn. She is still having a lot of pain and would like for her pain to be well-controlled.   Objective:  Vital signs in last 24 hours: Vitals:   12/03/20 2137 12/04/20 0333 12/04/20 0400 12/04/20 1117  BP: (!) 131/50 (!) 165/53 (!) 155/56 (!) 120/52  Pulse: 69 70  69  Resp:  18  18  Temp:  98.2 F (36.8 C)  98.5 F (36.9 C)  TempSrc:  Oral  Oral  SpO2:  100%  100%  Weight:  57.7 kg      Intake/Output Summary (Last 24 hours) at 12/04/2020 1313 Last data filed at 12/04/2020 0450 Gross per 24 hour  Intake 643.04 ml  Output 2701 ml  Net -2057.96 ml    Physical Exam: General: critically ill-appearing elderly female, lying in bed, NAD. CV: ventricle-paced rate, regular rhythm. Abdomen: distended, mild diffuse TTP, abdominal wall edema MSK: left BKA, UNNA boot in place on RLE Skin: warm and dry Neuro: AAOx4, no focal deficits noted.   Assessment/Plan:  Principal Problem:   Acute exacerbation of CHF (congestive heart failure) (HCC) Active Problems:   Complete heart block (HCC)   CKD stage 4 secondary to hypertension ([place])   Diabetes mellitus (HCC)   Paroxysmal atrial fibrillation (HCC)   AKI (acute kidney injury) ([place])  [name] [name] is a 70 year old female with PMH of CAD w/sinus arrest now s/p pacemaker, HFrEF (EF 30-35%), paroxysmal atrial fibrillation on eliquis, CKD stage IV, and normocytic anemia presenting with increasing SHOB and weight gain, admitted for biventricular heart failure exacerbation.  Biventricular Heart Failure CAD, Hx of sinus arrest s/p pacemaker Moderate MR, Severe TR Paroxysmal Atrial Fibrillation Right heart cath performed by Advanced HF yesterday with findings of biventricular HF with mild-moderate volume overload and prominent v-waves in PCWP trace indicating severe  MR or diastolic dysfunction. Advised that current volume status is likely the best we can get her to, with plan to decrease diuretics and dobutamine to be weaned slowly. Palliative care saw patient, CODE status changed to DNR/DNI. Patient aware of her clinical condition and reports wanting her pain to be controlled. Overall, patient unfortunately with poor prognosis. -greatly appreciate assistance from advanced HF, nephrology, and palliative care -PO dilaudid '2mg'$  prn for severe pain -IV dilaudid '1mg'$  prn for breakthrough pain -transitioned to PO torsemide '60mg'$  BID -slowly weaning dobutamine infusion -restarted eliquis for pAF   AKI on CKD Stage IV Cardiorenal syndrome Hyponatremia Hyperphosphatemia Unfortunately, she is a poor candidate for dialysis due to multiple comorbidities as per nephrology. However, patient would like to try dialysis if needed. Nephrology may consider time trial of dialysis if patient's kidney failure worsens, but patient would need to be off of dobutamine prior to this. -sodium 131 >> due to volume overload -potassium stable at 3.8 this AM -phosphate 8.1 this AM >> continue sevelamer '1600mg'$  TID with meals -trend renal function panel, avoid nephrotoxic medications    Prior to Admission Living Arrangement: Home Anticipated Discharge Location: TBD Barriers to Discharge: continued medical management Dispo: Anticipated discharge in approximately 3-4 day(s).   Virl Axe, MD 12/04/2020, 1:13 PM Pager: [phone] After 5pm on weekdays and 1pm on weekends: On Call pager [phone]

## 2020-12-04 NOTE — Progress Notes (Signed)
Daily Progress Note   Patient Name: [name] [name]       Date: 12/04/2020 DOB: 28-Oct-1950  Age: 70 y.o. MRN#: [id number] Attending Physician: Axel Filler, * Primary Care Physician: [name], MD Admit Date: 11/21/2020  Reason for Consultation/Follow-up: Establishing goals of care  Subjective: Chart Reviewed.   Patient awake, alert and oriented x3. She reports she is feeling much better today compared to other days. Denies pain or shortness of breath.   We reviewed goals of care discussions. Updates provided. Patient verbalized understanding and able to acknowledge discussions appropriately.   Patient states she is open to attempting dialysis if needed. She continues to compare her awareness of her friend who is currently undergoing dialysis and her ability to still function. Support provided, however with emphasis on patient not comparing her friend's overall state of health with hers. Education provided on what dialysis could look like with consideration to her poor heart health and overall functional state. Ms. mindel verhelst understanding.   We discussed best case and worst case scenrios. Patient reports she is hopeful she does not need to make a decision regarding dialysis and can return home with caregivers.   Patient is requesting outpatient palliative support at discharge.   Education provided on advanced directives including MOST form. Ms. Domina shares she had a feeding tube in the past and almost died. She does not wish to have similar encounters again. She states she has an advanced directive at home that she completed during a previous hospitalization naming her son and her friend Ihor Austin. She shares her son is now in prison and would possibly need to update her documents. Chaplain support offered which she agreed.   MOST form completed. Ms. Marcin outlined her wishes for the following treatment decisions:  Cardiopulmonary Resuscitation: Do Not  Attempt Resuscitation (DNR/No CPR)  Medical Interventions: Limited Additional Interventions: Use medical treatment, IV fluids and cardiac monitoring as indicated, DO NOT USE intubation or mechanical ventilation. May consider use of less invasive airway support such as BiPAP or CPAP. Also provide comfort measures. Transfer to the hospital if indicated. Avoid intensive care.   Antibiotics: Antibiotics if indicated  IV Fluids: IV fluids for a defined trial period  Feeding Tube: No feeding tube    Length of Stay: 13 days  Vital Signs: BP (!) 120/52 (BP Location: Left Arm)   Pulse 69   Temp 98.5 F (36.9 C) (Oral)   Resp 18   Wt 57.7 kg   SpO2 100%   BMI 23.27 kg/m  SpO2: SpO2: 100 % O2 Device: O2 Device: Room Air O2 Flow Rate:    Physical Exam: No acute distress Normal breathing pattern A&O x3            Palliative Care Assessment & Plan  HPI: Palliative Care consult requested for goals of care discussion in this 70 y.o. female with multiple medical problems including PAD with left BKA, systolic CHF, CKD 4, PAF (rivaroxaban), pacemaker, tobacco use, and hyperthyroidism. She presented to the ED from home with complaints of worsening shortness of breath. Patient recently discharged from hospital on 11/14/2020 with similar presentation. Patient being followed by Nephrology and deemed poor candidate for HD due to severe heart disease. She is is being followed by the Heart Failure team and scheduled for heart catheterization today.    Code Status:  DNR  Goals of Care/Recommendations:  Treat the treatable  Patient open to dialysis attempts if needed. Understands if she does not tolerate well  she would then make decisions to discontinue.   Education provided and MOST form completed. Form on chart.  Patient requesting assistance with updating advanced directives. (Spiritual support referral placed)  Patient requesting outpatient Palliative support.   PMT will continue to support and  follow as needed. Please call team line for urgent needs.   Prognosis: Guarded  Discharge Planning: To Be Determined with Palliative   Thank you for allowing the Palliative Medicine Team to assist in the care of this patient.  Time Total: 45 min.   Visit consisted of counseling and education dealing with the complex and emotionally intense issues of symptom management and palliative care in the setting of serious and potentially life-threatening illness.Greater than 50%  of this time was spent counseling and coordinating care related to the above assessment and plan.  Alda Lea, AGPCNP-BC  Palliative Medicine Team [phone]

## 2020-12-05 DIAGNOSIS — I5082 Biventricular heart failure: Secondary | ICD-10-CM | POA: Diagnosis not present

## 2020-12-05 DIAGNOSIS — N179 Acute kidney failure, unspecified: Secondary | ICD-10-CM | POA: Diagnosis not present

## 2020-12-05 DIAGNOSIS — I130 Hypertensive heart and chronic kidney disease with heart failure and stage 1 through stage 4 chronic kidney disease, or unspecified chronic kidney disease: Secondary | ICD-10-CM | POA: Diagnosis not present

## 2020-12-05 DIAGNOSIS — I5023 Acute on chronic systolic (congestive) heart failure: Secondary | ICD-10-CM | POA: Diagnosis not present

## 2020-12-05 DIAGNOSIS — N184 Chronic kidney disease, stage 4 (severe): Secondary | ICD-10-CM | POA: Diagnosis not present

## 2020-12-05 LAB — CBC
HCT: 22.5 % — ABNORMAL LOW (ref 36.0–46.0)
Hemoglobin: 7.3 g/dL — ABNORMAL LOW (ref 12.0–15.0)
MCH: 29 pg (ref 26.0–34.0)
MCHC: 32.4 g/dL (ref 30.0–36.0)
MCV: 89.3 fL (ref 80.0–100.0)
Platelets: 163 10*3/uL (ref 150–400)
RBC: 2.52 MIL/uL — ABNORMAL LOW (ref 3.87–5.11)
RDW: 17.2 % — ABNORMAL HIGH (ref 11.5–15.5)
WBC: 6.2 10*3/uL (ref 4.0–10.5)
nRBC: 0 % (ref 0.0–0.2)

## 2020-12-05 LAB — GLUCOSE, CAPILLARY
Glucose-Capillary: 113 mg/dL — ABNORMAL HIGH (ref 70–99)
Glucose-Capillary: 150 mg/dL — ABNORMAL HIGH (ref 70–99)
Glucose-Capillary: 126 mg/dL — ABNORMAL HIGH (ref 70–99)
Glucose-Capillary: 123 mg/dL — ABNORMAL HIGH (ref 70–99)

## 2020-12-05 LAB — RENAL FUNCTION PANEL
Albumin: 2.5 g/dL — ABNORMAL LOW (ref 3.5–5.0)
Anion gap: 14 (ref 5–15)
BUN: 112 mg/dL — ABNORMAL HIGH (ref 8–23)
CO2: 30 mmol/L (ref 22–32)
Calcium: 8.8 mg/dL — ABNORMAL LOW (ref 8.9–10.3)
Chloride: 90 mmol/L — ABNORMAL LOW (ref 98–111)
Creatinine, Ser: 4.69 mg/dL — ABNORMAL HIGH (ref 0.44–1.00)
GFR, Estimated: 10 mL/min — ABNORMAL LOW (ref >60)
Glucose, Bld: 98 mg/dL (ref 70–99)
Phosphorus: 7.2 mg/dL — ABNORMAL HIGH (ref 2.5–4.6)
Potassium: 3.4 mmol/L — ABNORMAL LOW (ref 3.5–5.1)
Sodium: 134 mmol/L — ABNORMAL LOW (ref 135–145)

## 2020-12-05 MED ORDER — POTASSIUM CHLORIDE CRYS ER 20 MEQ PO TBCR
40.0000 meq | EXTENDED_RELEASE_TABLET | Freq: Once | ORAL | Status: AC
  Administered 2020-12-05: 40 meq via ORAL
  Filled 2020-12-05: qty 2

## 2020-12-05 MED ORDER — POTASSIUM CHLORIDE CRYS ER 20 MEQ PO TBCR: 40.0000 meq | EXTENDED_RELEASE_TABLET | Freq: Two times a day (BID) | ORAL | Status: DC

## 2020-12-05 MED ORDER — TORSEMIDE 20 MG PO TABS
60.0000 mg | ORAL_TABLET | Freq: Two times a day (BID) | ORAL | Status: DC
  Administered 2020-12-06 – 2020-12-07 (×4): 60 mg via ORAL
  Filled 2020-12-05 (×5): qty 3

## 2020-12-05 MED ORDER — SODIUM CHLORIDE 0.9 % IV SOLN
510.0000 mg | Freq: Once | INTRAVENOUS | Status: AC
  Administered 2020-12-05: 510 mg via INTRAVENOUS
  Filled 2020-12-05 (×2): qty 17

## 2020-12-05 NOTE — Progress Notes (Signed)
Attempt 2 was made to notify attending MD for bloody stool via pager.

## 2020-12-05 NOTE — Progress Notes (Signed)
° °  Subjective:   No acute overnight events.  Reports feeling alright today. Asking when she can go home.   Objective:  Vital signs in last 24 hours: Vitals:   12/04/20 1117 12/04/20 2028 12/05/20 0334 12/05/20 1300  BP: (!) 120/52 (!) 124/38 (!) 147/48 (!) 97/52  Pulse: 69 69 70 70  Resp: '18 16 16 18  '$ Temp: 98.5 F (36.9 C) 98.3 F (36.8 C) 98.3 F (36.8 C) 97.7 F (36.5 C)  TempSrc: Oral Oral Oral Oral  SpO2: 100% 100% 100% 99%  Weight:   56.4 kg     Intake/Output Summary (Last 24 hours) at 12/05/2020 1355 Last data filed at 12/05/2020 [id number] Gross per 24 hour  Intake 560 ml  Output 1700 ml  Net -1140 ml    Physical Exam: General: critically ill-appearing elderly female, lying in bed, NAD. CV: ventricle-paced rate, regular rhythm. Abdomen: distended, diffuse TTP, abdominal wall edema still present but slightly improved. MSK: left BKA, trace edema bilaterally. Neuro: AAOx4, no focal deficits noted.   Assessment/Plan:  Principal Problem:   Acute exacerbation of CHF (congestive heart failure) (HCC) Active Problems:   Complete heart block (HCC)   CKD stage 4 secondary to hypertension ([place])   Diabetes mellitus (HCC)   Paroxysmal atrial fibrillation (HCC)   AKI (acute kidney injury) ([place])  Sonam Karaffa is a 70 year old female with PMH of CAD w/sinus arrest now s/p pacemaker, HFrEF (EF 30-35%), paroxysmal atrial fibrillation on eliquis, CKD stage IV, and normocytic anemia presenting with increasing SHOB and weight gain, admitted for biventricular heart failure exacerbation.  Biventricular Heart Failure CAD, Hx of sinus arrest s/p pacemaker Moderate MR, Severe TR Paroxysmal Atrial Fibrillation Overall, patient unfortunately with poor prognosis. Holding diuretics today due to continued worsening kidney function. Weaning dobutamine infusion as per Advanced HF with plan to stop it tomorrow. Discussed with Advanced HF, will restart coreg during outpatient clinic visit but no  other medications indicated for patient at this time. Palliative care following for goals of care discussions. Patient is DNR/DNI. -greatly appreciate assistance from Advanced HF, nephrology, and palliative care -pain control with PO dilaudid '2mg'$  prn for severe pain and IV dilaudid '1mg'$  prn for breakthrough pain  AKI on CKD Stage IV Cardiorenal syndrome Hyponatremia Hyperphosphatemia Unfortunately, she is a poor candidate for dialysis due to multiple comorbidities as per nephrology. However, patient would like to try dialysis if needed. Nephrology may consider time trial of dialysis if patient continues to have worsening kidney function after stopping dobutamine infusion. -sodium 134 >> due to volume overload -potassium 3.4 this AM, replete with one dose of PO KCl 41mq -phosphate 7.2 this AM >> continue sevelamer '1600mg'$  TID with meals -trend renal function panel, avoid nephrotoxic medications    Prior to Admission Living Arrangement: Home Anticipated Discharge Location: TBD Barriers to Discharge: continued medical management Dispo: Anticipated discharge in approximately 3-4 day(s).   JVirl Axe MD 12/05/2020, 1:55 PM Pager: [phone] 5pm on weekdays and 1pm on weekends: On Call pager [phone]

## 2020-12-05 NOTE — Progress Notes (Signed)
Occupational Therapy Treatment Patient Details Name: [name] [name] MRN: [id number] DOB: 11/27/1950 Today's Date: 12/05/2020    History of present illness Pt is a 70 y/o female admitted secondary to CHF exacerbation. s/p RHC 12/03/20. PMH includes s/p L BKA, CKD, DM, a fib, HTN, CHF, CAD, and s/p pacemaker.   OT comments   Pt making steady progress towards OT goals this session. Pt received seated EOB requesting to wash up. Overall, pt requiring less physical assist for functional transfers this session needing min guard assist for x2 stand pivot transfer from EOB<>BSC, however pt requried increased cues for safety awareness this session.Pt would continue to benefit from skilled occupational therapy while admitted and after d/c to address the below listed limitations in order to improve overall functional mobility and facilitate independence with BADL participation. DC plan remains appropriate, will follow acutely per POC.     Follow Up Recommendations  Home health OT    Equipment Recommendations  Other (comment) (Bertie AE for dressing/bathing)    Recommendations for Other Services      Precautions / Restrictions Precautions Precautions: Fall;Other (comment) Precaution Comments: Lft BKA Restrictions Weight Bearing Restrictions: Yes LLE Weight Bearing: Non weight bearing (L BKA)       Mobility Bed Mobility               General bed mobility comments: sitting EOB upon arrival and returned to EOB at end of session    Transfers Overall transfer level: Needs assistance Equipment used: 1 person hand held assist Transfers: Sit to/from [facility] Sit to Stand: Min guard Stand pivot transfers: Min guard       General transfer comment: MIN guard to sit<>stand from EOB for LB bathing, MIN guard for stand pivot transfer from EOB<>BSC needing light assist for steadying. Pt required cues for safety as pt trying to hold onto rolling side table for balance.     Balance Overall balance assessment: Needs assistance Sitting-balance support: Feet supported;No upper extremity supported Sitting balance-Leahy Scale: Good Sitting balance - Comments: sitting EOB for bathing tasks with no LOB   Standing balance support: Single extremity supported;During functional activity Standing balance-Leahy Scale: Poor Standing balance comment: at least one UE supported during standing ADL tasks                           ADL either performed or assessed with clinical judgement   ADL Overall ADL's : Needs assistance/impaired     Grooming: Wash/dry face;Sitting;Supervision/safety;Applying deodorant   Upper Body Bathing: Moderate assistance;Adhering to UE precautions Upper Body Bathing Details (indicate cue type and reason): MOD A for cleanliness sitting EOB Lower Body Bathing: Sit to/from stand;Minimal assistance Lower Body Bathing Details (indicate cue type and reason): MIN A fro standing balance from EOB while pt completed LB bathing in standing Upper Body Dressing : Minimal assistance;Sitting Upper Body Dressing Details (indicate cue type and reason): to don new gown     Toilet Transfer: Min Statistician Details (indicate cue type and reason): min guard for stand pivot transfer from EOB<>BSC with no AD Toileting- Clothing Manipulation and Hygiene: Total assistance;Sit to/from stand Toileting - Clothing Manipulation Details (indicate cue type and reason): total A for posterior pericare in standing d/t cleanliness     Functional mobility during ADLs: Min guard General ADL Comments: pt progressing this session needing less physical asssit to complete stand pivot toilet transfer, pt completed full wash up from EOB with up to  MOD A for bathing tasks     Vision       Perception     Praxis      Cognition Arousal/Alertness: Awake/alert Behavior During Therapy: WFL for tasks assessed/performed Overall Cognitive Status:  Impaired/Different from baseline Area of Impairment: Safety/judgement                         Safety/Judgement: Decreased awareness of safety     General Comments: pt with impaired safety awareness attempting to sit<>stand by using rolling side table for steadying assist        Exercises     Shoulder Instructions       General Comments pt with blood in stool- alerted RN    Pertinent Vitals/ Pain       Pain Assessment: No/denies pain  Home Living                                          Prior Functioning/Environment              Frequency  Min 2X/week        Progress Toward Goals  OT Goals(current goals can now be found in the care plan section)  Progress towards OT goals: Progressing toward goals  Acute Rehab OT Goals Patient Stated Goal: to go home OT Goal Formulation: With patient Time For Goal Achievement: 12/12/20 Potential to Achieve Goals: Good  Plan Discharge plan remains appropriate;Frequency remains appropriate    Co-evaluation                 AM-PAC OT "6 Clicks" Daily Activity     Outcome Measure   Help from another person eating meals?: None Help from another person taking care of personal grooming?: A Little Help from another person toileting, which includes using toliet, bedpan, or urinal?: A Little Help from another person bathing (including washing, rinsing, drying)?: A Little Help from another person to put on and taking off regular upper body clothing?: None Help from another person to put on and taking off regular lower body clothing?: A Little 6 Click Score: 20    End of Session Equipment Utilized During Treatment: Gait belt;Other (comment) (BSC)  OT Visit Diagnosis: Other abnormalities of gait and mobility (R26.89);Muscle weakness (generalized) (M62.81)   Activity Tolerance Patient tolerated treatment well   Patient Left in bed;with call bell/phone within reach;Other (comment) (sitting  EOB)   Nurse Communication Mobility status;Other (comment) (please look at stool in BR)        [id number] OT Time Calculation (min): 33 min  Charges: OT General Charges $OT Visit: 1 Visit OT Treatments $Self Care/Home Management : 23-37 mins  Harley Alto., COTA/L Acute Rehabilitation Services [phone]    Precious Haws 12/05/2020, 11:41 AM

## 2020-12-05 NOTE — Progress Notes (Signed)
Attempt 3 was made to notify the attending MD via pager, day shift pager number [phone] called. Waiting for response.  Pt is resting in bed. NO signs of distress noted. NO needs or concerns at this time. No pain noted. Eliquis was not given until further order have been given.

## 2020-12-05 NOTE — Progress Notes (Signed)
Bloody stool x1 noted. Attempt 1 to reach attending MD via pager.

## 2020-12-05 NOTE — Progress Notes (Signed)
Patient ID: [name] [name], female   DOB: [birth year], 70 y.o.   MRN: [id number] S: Patient states she feels fine today but would like to go home.  No other complaints O:BP (!) 147/48 (BP Location: Right Wrist)   Pulse 70   Temp 98.3 F (36.8 C) (Oral)   Resp 16   Wt 56.4 kg   SpO2 100%   BMI 22.75 kg/m   Intake/Output Summary (Last 24 hours) at 12/05/2020 1146 Last data filed at 12/05/2020 0752 Gross per 24 hour  Intake 810 ml  Output 1900 ml  Net -1090 ml   Intake/Output: I/O last 3 completed shifts: In: 1203 [P.O.:1180; I.V.:23] Out: 3200 [Urine:3200]  Intake/Output this shift:  Total I/O In: -  Out: 400 [Urine:400] Weight change: -1.27 kg Gen: Appears chronically ill, sitting on the side of bed CVS: Normal rate, distended neck veins Resp: Bilateral chest rise, mild increased work of breathing Abd: Mild distention Ext: Lower extremity edema trace  Recent Labs  Lab 11/29/20 1715 11/30/20 0624 11/30/20 0933 12/01/20 0420 12/02/20 0308 12/03/20 0423 12/03/20 1403 12/03/20 1404 12/03/20 1408 12/04/20 0402 12/05/20 0341  NA 133* 131*  --  132* 128* 129* 131* 135 131* 131* 134*  K 4.7 5.6*  --  4.7 4.3 4.2 3.8 3.5 3.8 3.8 3.4*  CL 97* 95*  --  94* 88* 86*  --   --   --  87* 90*  CO2 22 21*  --  '24 25 29  '$ --   --   --  29 30  GLUCOSE 117* 66*  --  93 100* 89  --   --   --  95 98  BUN 75* 87*  --  83* 85* 91*  --   --   --  101* 112*  CREATININE 3.48* 3.55*  --  3.63* 3.56* 4.25*  --   --   --  4.52* 4.69*  ALBUMIN  --   --  2.7* 2.6* 2.7* 2.5*  --   --   --  2.6* 2.5*  CALCIUM 8.9 8.9  --  9.3 9.2 9.6  --   --   --  9.4 8.8*  PHOS  --   --   --  7.2* 7.1* 7.5*  --   --   --  8.1* 7.2*  AST  --   --  37  --   --   --   --   --   --   --   --   ALT  --   --  24  --   --   --   --   --   --   --   --    Liver Function Tests: Recent Labs  Lab 11/30/20 0933 12/01/20 0420 12/03/20 0423 12/04/20 0402 12/05/20 0341  AST 37  --   --   --   --   ALT 24  --   --    --   --   ALKPHOS 92  --   --   --   --   BILITOT 1.5*  --   --   --   --   PROT 5.9*  --   --   --   --   ALBUMIN 2.7*   < > 2.5* 2.6* 2.5*   < > = values in this interval not displayed.   No results for input(s): LIPASE, AMYLASE in the last 168 hours. No results for input(s): AMMONIA in the  last 168 hours. CBC: Recent Labs  Lab 11/30/20 0719 12/01/20 0420 12/03/20 0423 12/03/20 1403 12/03/20 1408 12/04/20 0402 12/05/20 0341  WBC 6.6 6.2 6.3  --   --  6.5 6.2  HGB 7.7* 7.8* 7.8*   < > 8.8* 8.1* 7.3*  HCT 23.6* 23.9* 22.8*   < > 26.0* 24.9* 22.5*  MCV 88.7 87.9 87.0  --   --  89.2 89.3  PLT 114* 122* 146*  --   --  148* 163   < > = values in this interval not displayed.   Cardiac Enzymes: No results for input(s): CKTOTAL, CKMB, CKMBINDEX, TROPONINI in the last 168 hours. CBG: Recent Labs  Lab 12/03/20 2112 12/04/20 0605 12/04/20 1700 12/04/20 2059 12/05/20 0600  GLUCAP 121* 102* 143* 123* 113*    Iron Studies:  No results for input(s): IRON, TIBC, TRANSFERRIN, FERRITIN in the last 72 hours. Studies/Results: CARDIAC CATHETERIZATION  Result Date: 12/03/2020 Findings: On dobutamine 41mg/kg/min RA = 11 RV = 54/12 PA = 58/19 (35) PCW = 21 (v = 38) Fick cardiac output/index = 7.8/4.8 PVR = 1.8 WU Ao sat = 99% PA sat = 73%, 73% Assessment: 1. Biventricular HF with mild to moderate volume overload 2. Prominent v-waves in PCWP tracing c/w severe MR or diastolic dysfunction 3. High cardiac output on dobutamine Plan/Discussion: I think this is about as good as we can get her. Will hold lasix. Wean DBA slowly. Follow creatinine. Continue palliative discussions. DGlori Bickers MD 2:15 PM  . apixaban  2.5 mg Oral BID  . atorvastatin  40 mg Oral Daily  . calcitRIOL  0.5 mcg Oral Daily  . darbepoetin (ARANESP) injection - NON-DIALYSIS  25 mcg Subcutaneous Q Fri-1800  . sevelamer carbonate  1,600 mg Oral TID WC  . sodium chloride flush  3 mL Intravenous Q12H  . sodium chloride  flush  3 mL Intravenous Q12H  . [START ON 12/06/2020] torsemide  60 mg Oral BID    BMET    Component Value Date/Time   NA 134 (L) 12/05/2020 0341   NA 142 11/08/2020 1039   K 3.4 (L) 12/05/2020 0341   CL 90 (L) 12/05/2020 0341   CO2 30 12/05/2020 0341   GLUCOSE 98 12/05/2020 0341   BUN 112 (H) 12/05/2020 0341   BUN 21 11/08/2020 1039   CREATININE 4.69 (H) 12/05/2020 0341   CREATININE 2.26 (H) 04/07/2018 1330   CALCIUM 8.8 (L) 12/05/2020 0341   CALCIUM 9.3 08/28/2011 1648   GFRNONAA 10 (L) 12/05/2020 0341   GFRNONAA 29 (L) 11/10/2012 1115   GFRAA 28 (L) 11/08/2020 1039   GFRAA 33 (L) 11/10/2012 1115   CBC    Component Value Date/Time   WBC 6.2 12/05/2020 0341   RBC 2.52 (L) 12/05/2020 0341   HGB 7.3 (L) 12/05/2020 0341   HGB 11.7 11/25/2018 0931   HCT 22.5 (L) 12/05/2020 0341   HCT 35.8 11/25/2018 0931   PLT 163 12/05/2020 0341   PLT 146 (L) 11/25/2018 0931   MCV 89.3 12/05/2020 0341   MCV 93 11/25/2018 0931   MCH 29.0 12/05/2020 0341   MCHC 32.4 12/05/2020 0341   RDW 17.2 (H) 12/05/2020 0341   RDW 13.1 11/25/2018 0931   LYMPHSABS 1.2 11/28/2020 0347   LYMPHSABS 2.0 11/25/2018 0931   MONOABS 0.8 11/28/2020 0347   EOSABS 0.3 11/28/2020 0347   EOSABS 0.7 (H) 11/25/2018 0931   BASOSABS 0.0 11/28/2020 0347   BASOSABS 0.1 11/25/2018 0931    Assessment/Plan: 1. AKI/CKD stage  IV in setting of decompensated systolic and diastolic CHF with escalating doses of IV lasix. Likely cardiorenal syndrome. Creatinine continues to be slightly worse today 1. We will pause oral diuretics today, ordered to resume tomorrow, potassium supplementation adjusted 2. Dobutamine decreased today 3. Maintain current volume status 4. Monitor creatinine closely; hopefully will stabilize in the coming days 5. I have personally had a long conversation with the patient as well as palliative care.  She is DNR but wants to try dialysis if needed.  2. Acute on chronic systolic and diastolic CHF-  profound volume overload. EF 30% with severe TR.now significantly improved volume status.  Heart failure managing.  Dobutamine is being weaned 3. Complete heart block - s/p PPM, high percentage of RV pacing per Cardiology 4. P. Afib - on xarelto, now in NSR.  5. Severe protein malnutrition -encourage p.o. intake 6. Moderate MR and severe TR- as above recommend heart failure team consultation 7. Hyponatremia - due to volume overload.Stable 8. Hyperphosphatemia: Continue sevelamer 3 times daily 9. Anemia of CKD stage IV- cont to follow and transfuse prn.Will give IV feraheme due to low TSAT. Started ESA. 10. Disposition - poor overall prognosis as above.  Palliative care assisting.  Appreciate help

## 2020-12-05 NOTE — Progress Notes (Signed)
Attending aware. Resident will come evaluate pt. Given permission to admin eliquis.

## 2020-12-05 NOTE — Progress Notes (Addendum)
Patient ID: [name] [name], female   DOB: [birth year], 70 y.o.   MRN: [id number]     Advanced Heart Failure Rounding Note  PCP-Cardiologist: No primary care provider on file.    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 Now admitted with worsening HF and AKI likely due to cardiorenal syndrome  Echo EF 30% with severe TR  Started on empiric DBA for suspected low output HF and cardiorenal syndrome + Lasix gtt.   Subjective:    Transitioned to oral diuretic yesterday. Still net neg about a liter and weight down another kilo. Her breathing is stable, Cr slightly worse today 4.69 from 4.52 yesterday, BUN now 112.  Pain and cramping continued to improve has not required any narcotics. She denies any nausea, change in taste.    Patient underwent RHC 3/7 on dobutamine 66mg/kg/min  RA = 11 RV = 54/12 PA = 58/19 (35) PCW = 21 (v = 38) Fick cardiac output/index = 7.8/4.8 PVR = 1.8 WU Ao sat = 99% PA sat = 73%, 73%  Assessment:  1. Biventricular HF with mild to moderate volume overload 2. Prominent v-waves in PCWP tracing c/w severe MR or diastolic dysfunction 3. High cardiac output on dobutamine  Objective:   Weight Range: 56.4 kg Body mass index is 22.75 kg/m.   Vital Signs:   Temp:  [98.3 F (36.8 C)-98.5 F (36.9 C)] 98.3 F (36.8 C) (03/09 0334) Pulse Rate:  [69-70] 70 (03/09 0334) Resp:  [16-18] 16 (03/09 0334) BP: (120-147)/(38-52) 147/48 (03/09 0334) SpO2:  [100 %] 100 % (03/09 0334) Weight:  [56.4 kg] 56.4 kg (03/09 0334) Last BM Date: 12/03/20  Weight change: Filed Weights   12/03/20 0100 12/04/20 0333 12/05/20 0334  Weight: 61.1 kg 57.7 kg 56.4 kg    Intake/Output:   Intake/Output Summary (Last 24 hours) at 12/05/2020 0806 Last data filed at 12/05/2020 0752 Gross per 24 hour  Intake 1060 ml  Output 2300 ml  Net -1240 ml      Physical Exam    Cardiac:   Neck veins flat, normal rate and rhythm, clear s1 and s2, 3/6 TR, rubs or gallops, no LE edema, left bka Pulmonary: bibasilar rales otherwise distant breath sounds, not in distress Abdominal: soft, no guarding  Psych: Alert, conversant, in good spirits   Telemetry   V-paced 70s Personally reviewed   Labs    CBC Recent Labs    12/04/20 0402 12/05/20 0341  WBC 6.5 6.2  HGB 8.1* 7.3*  HCT 24.9* 22.5*  MCV 89.2 89.3  PLT 148* [id number]  Basic Metabolic Panel Recent Labs    12/04/20 0402 12/05/20 0341  NA 131* 134*  K 3.8 3.4*  CL 87* 90*  CO2 29 30  GLUCOSE 95 98  BUN 101* 112*  CREATININE 4.52* 4.69*  CALCIUM 9.4 8.8*  PHOS 8.1* 7.2*   Liver Function Tests Recent Labs    12/04/20 0402 12/05/20 0341  ALBUMIN 2.6* 2.5*   No results for input(s): LIPASE, AMYLASE in the last 72 hours. Cardiac Enzymes No results for input(s): CKTOTAL, CKMB, CKMBINDEX, TROPONINI in the last 72 hours.  BNP: BNP (last 3 results) Recent Labs    06/20/20 0956 11/21/20 1105  BNP >4,500.0* >4,500.0*    ProBNP (last 3 results) No results for input(s): PROBNP in the last 8760 hours.   D-Dimer No results for input(s): DDIMER in  the last 72 hours. Hemoglobin A1C No results for input(s): HGBA1C in the last 72 hours. Fasting Lipid Panel No results for input(s): CHOL, HDL, LDLCALC, TRIG, CHOLHDL, LDLDIRECT in the last 72 hours. Thyroid Function Tests No results for input(s): TSH, T4TOTAL, T3FREE, THYROIDAB in the last 72 hours.  Invalid input(s): FREET3  Other results:   Imaging    No results found.   Medications:     Scheduled Medications: . apixaban  2.5 mg Oral BID  . atorvastatin  40 mg Oral Daily  . calcitRIOL  0.5 mcg Oral Daily  . darbepoetin (ARANESP) injection - NON-DIALYSIS  25 mcg Subcutaneous Q Fri-1800  . potassium chloride  40 mEq Oral Once  . sevelamer carbonate  1,600 mg Oral TID WC  . sodium chloride flush  3 mL Intravenous Q12H  . sodium chloride  flush  3 mL Intravenous Q12H  . [START ON 12/06/2020] torsemide  60 mg Oral BID    Infusions: . sodium chloride    . DOBUTamine 2.5 mcg/kg/min (12/04/20 1409)  . ferumoxytol      PRN Medications: sodium chloride, acetaminophen, albuterol, diclofenac Sodium, HYDROmorphone (DILAUDID) injection, HYDROmorphone, hydrOXYzine, loperamide, ondansetron (ZOFRAN) IV, polyethylene glycol, sodium chloride flush    Assessment/Plan    1. A/C Systolic Heart Failure - Etiology unclear possibly from RV pacing?  Has [facility] PPM - Myoview 08/2020  Showed EF 30% with no signs of ischemia. Unable to cath with CKD. No chest pain.  -ECHO this admit with EF 30-35% RV normal. Severe TR - Continue weaning dobutamine - volume status and breathing has improved with diuresis and dobutamine support but with worsening renal function - diuretic was held today by nephrology - Poor candidate for dialysis  - Off Coreg w/ suspected low output.   - No dig/spir/arni with elevated creatinine.   2.  CKD Stage IV - Creatinine baseline  2.9-3.3 - Cr continues to worsen, diuretics held today by nephrology, she has low oncotic pressure likely has nephrotic syndrome, also may not be tolerating diuresis due to intrinsic renal disease, see if she will stabilize - Nephrology following. They also feel she would have a hard time tolerating iHD but given her goals of care are willing to do a trial if necessary  3. PAF - In NSR - On eliquis  4. 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   5. PAD -H/O LBKA   6. Hyperthyroidism  - Thyroid US 11/12/20 with numerous small nodules  -11/22/20 TSH  0.014 Free T3 1.3   7. Severe TR -Noted on ECHO   8. Hyperkalemia  -resolved  9. Anemia  - Hgb has been drifting down from 10 ->8.2->7.7  - Ferritin 135, Iron 44, Sats 15% - Received IV Feraheme, nephro giving ESA  10. CAD  - mild CAD on cath 2005   - Myoview 2021 no signs of ischemia - On statin + eliquis  11. Social  Referred to SW for assistance with financial difficulties.    Length of Stay: [place], [place]  12/05/2020, 8:06 AM  Advanced Heart Failure Team Pager [phone] (M-F; 7a - 4p)  Please contact [facility] for night-coverage after hours (4p -7a ) and weekends on [email or web address]  Patient seen and examined with the above-signed Advanced Practice Provider and/or Housestaff. I personally reviewed laboratory data, imaging studies and relevant notes. I independently examined the patient and formulated  the important aspects of the plan. I have edited the note to reflect any of my changes or salient points. I have personally discussed the plan with the patient and/or family.  Remains on DBA at 2.5. Continues to diurese on oral diuretics. Creatinine slightly worse today. Denies SOB,orthopnea or PND.   General:  Sitting on edge of bed No resp difficulty HEENT: normal Neck: supple. CVP 8-9. Carotids 2+ bilat; no bruits. No lymphadenopathy or thryomegaly appreciated. Cor: PMI nondisplaced. Regular rate & rhythm. 2/6 TR Lungs: clear Abdomen: soft, nontender, nondistended. No hepatosplenomegaly. No bruits or masses. Good bowel sounds. Extremities: no cyanosis, clubbing, rash, tr-1+ edema L BKA Neuro: alert & orientedx3, cranial nerves grossly intact. moves all 4 extremities w/o difficulty. Affect pleasant  Agree with holding diuretics today. Will continue DBA one more day and plan to stop tomorrow with no plans to restart. She says she is willing to try HD if she needs it but I doubt she would tolerate for very long given the severity of her cardiomyopathy.  Glori Bickers, MD  10:14 AM

## 2020-12-06 DIAGNOSIS — I5023 Acute on chronic systolic (congestive) heart failure: Secondary | ICD-10-CM | POA: Diagnosis not present

## 2020-12-06 DIAGNOSIS — N184 Chronic kidney disease, stage 4 (severe): Secondary | ICD-10-CM | POA: Diagnosis not present

## 2020-12-06 DIAGNOSIS — N179 Acute kidney failure, unspecified: Secondary | ICD-10-CM | POA: Diagnosis not present

## 2020-12-06 DIAGNOSIS — D62 Acute posthemorrhagic anemia: Secondary | ICD-10-CM

## 2020-12-06 DIAGNOSIS — I130 Hypertensive heart and chronic kidney disease with heart failure and stage 1 through stage 4 chronic kidney disease, or unspecified chronic kidney disease: Secondary | ICD-10-CM | POA: Diagnosis not present

## 2020-12-06 DIAGNOSIS — I5082 Biventricular heart failure: Secondary | ICD-10-CM | POA: Diagnosis not present

## 2020-12-06 LAB — GLUCOSE, CAPILLARY
Glucose-Capillary: 119 mg/dL — ABNORMAL HIGH (ref 70–99)
Glucose-Capillary: 143 mg/dL — ABNORMAL HIGH (ref 70–99)
Glucose-Capillary: 157 mg/dL — ABNORMAL HIGH (ref 70–99)
Glucose-Capillary: 117 mg/dL — ABNORMAL HIGH (ref 70–99)

## 2020-12-06 LAB — RENAL FUNCTION PANEL
Albumin: 2.5 g/dL — ABNORMAL LOW (ref 3.5–5.0)
Anion gap: 12 (ref 5–15)
BUN: 116 mg/dL — ABNORMAL HIGH (ref 8–23)
CO2: 30 mmol/L (ref 22–32)
Calcium: 8.7 mg/dL — ABNORMAL LOW (ref 8.9–10.3)
Chloride: 94 mmol/L — ABNORMAL LOW (ref 98–111)
Creatinine, Ser: 4.54 mg/dL — ABNORMAL HIGH (ref 0.44–1.00)
GFR, Estimated: 10 mL/min — ABNORMAL LOW (ref >60)
Glucose, Bld: 152 mg/dL — ABNORMAL HIGH (ref 70–99)
Phosphorus: 6.8 mg/dL — ABNORMAL HIGH (ref 2.5–4.6)
Potassium: 3.8 mmol/L (ref 3.5–5.1)
Sodium: 136 mmol/L (ref 135–145)

## 2020-12-06 LAB — CBC WITH DIFFERENTIAL/PLATELET
Abs Immature Granulocytes: 0.04 10*3/uL (ref 0.00–0.07)
Basophils Absolute: 0 10*3/uL (ref 0.0–0.1)
Basophils Relative: 1 %
Eosinophils Absolute: 0.6 10*3/uL — ABNORMAL HIGH (ref 0.0–0.5)
Eosinophils Relative: 7 %
HCT: 23.2 % — ABNORMAL LOW (ref 36.0–46.0)
Hemoglobin: 8 g/dL — ABNORMAL LOW (ref 12.0–15.0)
Immature Granulocytes: 1 %
Lymphocytes Relative: 11 %
Lymphs Abs: 0.9 10*3/uL (ref 0.7–4.0)
MCH: 30.8 pg (ref 26.0–34.0)
MCHC: 34.5 g/dL (ref 30.0–36.0)
MCV: 89.2 fL (ref 80.0–100.0)
Monocytes Absolute: 1 10*3/uL (ref 0.1–1.0)
Monocytes Relative: 12 %
Neutro Abs: 5.7 10*3/uL (ref 1.7–7.7)
Neutrophils Relative %: 68 %
Platelets: 168 10*3/uL (ref 150–400)
RBC: 2.6 MIL/uL — ABNORMAL LOW (ref 3.87–5.11)
RDW: 16.7 % — ABNORMAL HIGH (ref 11.5–15.5)
WBC: 8.4 10*3/uL (ref 4.0–10.5)
nRBC: 0 % (ref 0.0–0.2)

## 2020-12-06 LAB — CBC
HCT: 22.9 % — ABNORMAL LOW (ref 36.0–46.0)
Hemoglobin: 7.8 g/dL — ABNORMAL LOW (ref 12.0–15.0)
MCH: 30.5 pg (ref 26.0–34.0)
MCHC: 34.1 g/dL (ref 30.0–36.0)
MCV: 89.5 fL (ref 80.0–100.0)
Platelets: 162 10*3/uL (ref 150–400)
RBC: 2.56 MIL/uL — ABNORMAL LOW (ref 3.87–5.11)
RDW: 16.5 % — ABNORMAL HIGH (ref 11.5–15.5)
WBC: 7.4 10*3/uL (ref 4.0–10.5)
nRBC: 0 % (ref 0.0–0.2)

## 2020-12-06 LAB — PREPARE RBC (CROSSMATCH)

## 2020-12-06 MED ORDER — DARBEPOETIN ALFA 60 MCG/0.3ML IJ SOSY
60.0000 ug | PREFILLED_SYRINGE | INTRAMUSCULAR | Status: DC
  Administered 2020-12-07: 60 ug via SUBCUTANEOUS
  Filled 2020-12-06: qty 0.3

## 2020-12-06 MED ORDER — POTASSIUM CHLORIDE CRYS ER 20 MEQ PO TBCR
40.0000 meq | EXTENDED_RELEASE_TABLET | Freq: Once | ORAL | Status: AC
  Administered 2020-12-06: 40 meq via ORAL
  Filled 2020-12-06: qty 2

## 2020-12-06 MED ORDER — SODIUM CHLORIDE 0.9% IV SOLUTION: Freq: Once | INTRAVENOUS | Status: AC

## 2020-12-06 NOTE — Progress Notes (Signed)
Orthopedic Tech Progress Note Patient Details:  [name] [name] 11/19/1950 [id number]  Ortho Devices Type of Ortho Device: Louretta Parma boot Ortho Device/Splint Location: RLE Ortho Device/Splint Interventions: Ordered,Application,Adjustment   Post Interventions Patient Tolerated: Well Instructions Provided: Care of device,Poper ambulation with device   [name] [name] 12/06/2020, 6:44 PM

## 2020-12-06 NOTE — Progress Notes (Signed)
This chaplain responded to PMT consult for spiritual care and completing Pt. HCPOA.  The Pt. is awake and willing to participate in chaplain education on the role of HCPOA.    The HCPOA document was completed by the Pt. and placed on the bedside table. The chaplain will attempt a return to facilitate the notarization of the document with the Pt. today.

## 2020-12-06 NOTE — Progress Notes (Addendum)
Patient ID: [name] [name], female   DOB: 1951-04-04, 70 y.o.   MRN: [id number] S: Patient states she is frustrated that she has to stay in the hospital.  Otherwise feels fairly well. O:BP (!) 125/105 (BP Location: Left Arm)   Pulse 70   Temp 97.8 F (36.6 C) (Oral)   Resp 18   Wt 56.8 kg Comment: scale b  SpO2 100%   BMI 22.92 kg/m   Intake/Output Summary (Last 24 hours) at 12/06/2020 1107 Last data filed at 12/06/2020 1026 Gross per 24 hour  Intake 1027.58 ml  Output 800 ml  Net 227.58 ml   Intake/Output: I/O last 3 completed shifts: In: [id number] [P.O.:730; I.V.:3; Blood:315] Out: 2100 [Urine:2100]  Intake/Output this shift:  Total I/O In: 589.6 [P.O.:120; I.V.:25; Blood:444.6] Out: -  Weight change: 0.408 kg Gen: Appears chronically ill, lying in bed, no distress CVS: Normal rate, distended neck veins Resp: Bilateral chest rise, mild increased work of breathing Abd: Mild distention Ext: Lower extremity edema trace  Recent Labs  Lab 11/30/20 0624 11/30/20 0933 12/01/20 0420 12/02/20 0308 12/03/20 0423 12/03/20 1403 12/03/20 1404 12/03/20 1408 12/04/20 0402 12/05/20 0341 12/06/20 0147  NA 131*  --  132* 128* 129* 131* 135 131* 131* 134* 136  K 5.6*  --  4.7 4.3 4.2 3.8 3.5 3.8 3.8 3.4* 3.8  CL 95*  --  94* 88* 86*  --   --   --  87* 90* 94*  CO2 21*  --  '24 25 29  '$ --   --   --  '29 30 30  '$ GLUCOSE 66*  --  93 100* 89  --   --   --  95 98 152*  BUN 87*  --  83* 85* 91*  --   --   --  101* 112* 116*  CREATININE 3.55*  --  3.63* 3.56* 4.25*  --   --   --  4.52* 4.69* 4.54*  ALBUMIN  --  2.7* 2.6* 2.7* 2.5*  --   --   --  2.6* 2.5* 2.5*  CALCIUM 8.9  --  9.3 9.2 9.6  --   --   --  9.4 8.8* 8.7*  PHOS  --   --  7.2* 7.1* 7.5*  --   --   --  8.1* 7.2* 6.8*  AST  --  37  --   --   --   --   --   --   --   --   --   ALT  --  24  --   --   --   --   --   --   --   --   --    Liver Function Tests: Recent Labs  Lab 11/30/20 0933 12/01/20 0420 12/04/20 0402  12/05/20 0341 12/06/20 0147  AST 37  --   --   --   --   ALT 24  --   --   --   --   ALKPHOS 92  --   --   --   --   BILITOT 1.5*  --   --   --   --   PROT 5.9*  --   --   --   --   ALBUMIN 2.7*   < > 2.6* 2.5* 2.5*   < > = values in this interval not displayed.   No results for input(s): LIPASE, AMYLASE in the last 168 hours. No results for input(s): AMMONIA  in the last 168 hours. CBC: Recent Labs  Lab 12/01/20 0420 12/03/20 0423 12/03/20 1403 12/04/20 0402 12/05/20 0341 12/06/20 0147  WBC 6.2 6.3  --  6.5 6.2 6.9  HGB 7.8* 7.8*   < > 8.1* 7.3* 6.6*  HCT 23.9* 22.8*   < > 24.9* 22.5* 20.2*  MCV 87.9 87.0  --  89.2 89.3 91.4  PLT 122* 146*  --  148* 163 158   < > = values in this interval not displayed.   Cardiac Enzymes: No results for input(s): CKTOTAL, CKMB, CKMBINDEX, TROPONINI in the last 168 hours. CBG: Recent Labs  Lab 12/05/20 1255 12/05/20 1620 12/05/20 2112 12/06/20 0617 12/06/20 1104  GLUCAP 150* 126* 123* 119* 143*    Iron Studies:  No results for input(s): IRON, TIBC, TRANSFERRIN, FERRITIN in the last 72 hours. Studies/Results: No results found. Marland Kitchen atorvastatin  40 mg Oral Daily  . calcitRIOL  0.5 mcg Oral Daily  . [START ON 12/07/2020] darbepoetin (ARANESP) injection - NON-DIALYSIS  60 mcg Subcutaneous Q Fri-1800  . sevelamer carbonate  1,600 mg Oral TID WC  . sodium chloride flush  3 mL Intravenous Q12H  . sodium chloride flush  3 mL Intravenous Q12H  . torsemide  60 mg Oral BID    BMET    Component Value Date/Time   NA 136 12/06/2020 0147   NA 142 11/08/2020 1039   K 3.8 12/06/2020 0147   CL 94 (L) 12/06/2020 0147   CO2 30 12/06/2020 0147   GLUCOSE 152 (H) 12/06/2020 0147   BUN 116 (H) 12/06/2020 0147   BUN 21 11/08/2020 1039   CREATININE 4.54 (H) 12/06/2020 0147   CREATININE 2.26 (H) 04/07/2018 1330   CALCIUM 8.7 (L) 12/06/2020 0147   CALCIUM 9.3 08/28/2011 1648   GFRNONAA 10 (L) 12/06/2020 0147   GFRNONAA 29 (L) 11/10/2012 1115    GFRAA 28 (L) 11/08/2020 1039   GFRAA 33 (L) 11/10/2012 1115   CBC    Component Value Date/Time   WBC 6.9 12/06/2020 0147   RBC 2.21 (L) 12/06/2020 0147   HGB 6.6 (LL) 12/06/2020 0147   HGB 11.7 11/25/2018 0931   HCT 20.2 (L) 12/06/2020 0147   HCT 35.8 11/25/2018 0931   PLT 158 12/06/2020 0147   PLT 146 (L) 11/25/2018 0931   MCV 91.4 12/06/2020 0147   MCV 93 11/25/2018 0931   MCH 29.9 12/06/2020 0147   MCHC 32.7 12/06/2020 0147   RDW 17.5 (H) 12/06/2020 0147   RDW 13.1 11/25/2018 0931   LYMPHSABS 1.2 11/28/2020 0347   LYMPHSABS 2.0 11/25/2018 0931   MONOABS 0.8 11/28/2020 0347   EOSABS 0.3 11/28/2020 0347   EOSABS 0.7 (H) 11/25/2018 0931   BASOSABS 0.0 11/28/2020 0347   BASOSABS 0.1 11/25/2018 0931    Assessment/Plan: 1. AKI/CKD stage IV in setting of decompensated systolic and diastolic CHF with escalating doses of IV lasix. Likely cardiorenal syndrome. Creatinine continues to be slightly worse today 1. Diuretics paused yesterday.  Continuing today.  Creatinine stable 2. Dobutamine being weaned 3. Maintain current volume status 4. Monitor creatinine closely; hopefully will stabilize in the coming days 5. I have personally had a long conversation with the patient as well as palliative care.  She is DNR but wants to try dialysis if needed.  She really wants to leave the hospital and we discussed that she cannot do this until her kidney function is improving off of dobutamine 6. I would not start dialysis preemptively given her wishes.  Goal  is to wait as long as possible per patient 2. Acute on chronic systolic and diastolic CHF- profound volume overload initially. EF 30% with severe TR.now significantly improved volume status.  Heart failure managing.  Dobutamine is being weaned 3. Complete heart block - s/p PPM, high percentage of RV pacing per Cardiology 4. P. Afib - on xarelto, now in NSR.  5. Severe protein malnutrition -encourage p.o. intake 6. Moderate MR and severe  TR- as above recommend heart failure team consultation 7. Hyponatremia - due to volume overload.Stable 8. Hyperphosphatemia: Continue sevelamer 3 times daily 9. Anemia of CKD stage IV- cont to follow and transfuse prn.She has been given Feraheme and is on ESA.  Receiving a transfusion today 10. Disposition - poor overall prognosis as above.  Palliative care assisting.  Appreciate help

## 2020-12-06 NOTE — Progress Notes (Signed)
   Subjective:   No acute overnight events.  Feels the same as yesterday. Asking again when she can go home. Does not want to go to a nursing facility from here.   Objective:  Vital signs in last 24 hours: Vitals:   12/06/20 0506 12/06/20 0524 12/06/20 0850 12/06/20 1104  BP: (!) 101/50 (!) 101/45 (!) 105/50 (!) 125/105  Pulse: 69 70 70 70  Resp: '18 18 18 18  '$ Temp: 98.2 F (36.8 C) 98 F (36.7 C) 98 F (36.7 C) 97.8 F (36.6 C)  TempSrc: Oral Oral Oral Oral  SpO2: 100% 94% 99% 100%  Weight:        Intake/Output Summary (Last 24 hours) at 12/06/2020 1307 Last data filed at 12/06/2020 1107 Gross per 24 hour  Intake 1124.72 ml  Output 800 ml  Net 324.72 ml    Physical Exam: General: critically ill-appearing elderly female, lying in bed, NAD. CV: ventricle-paced rate, regular rhythm. Abdomen: distended, diffuse TTP, abdominal wall edema present. MSK: left BKA, trace edema bilaterally. Neuro: AAOx3, no focal deficits.  Assessment/Plan:  Principal Problem:   Acute exacerbation of CHF (congestive heart failure) (HCC) Active Problems:   Complete heart block (HCC)   CKD stage 4 secondary to hypertension ([place])   Diabetes mellitus (HCC)   Paroxysmal atrial fibrillation (HCC)   AKI (acute kidney injury) ([place])  [name] [name] is a 70 year old female with PMH of CAD w/sinus arrest now s/p pacemaker, HFrEF (EF 30-35%), paroxysmal atrial fibrillation on eliquis, CKD stage IV, and normocytic anemia presenting with increasing SHOB and weight gain, admitted for biventricular heart failure exacerbation complicated by cardiorenal syndrome.  Biventricular Heart Failure CAD, Hx of sinus arrest s/p pacemaker Moderate MR, Severe TR Paroxysmal Atrial Fibrillation Overall, patient unfortunately with poor prognosis. Restarted maintenance diuretics today as per Advanced HF. Stopped dobutamine infusion today. Coreg will be restarted during outpatient clinic visit with cardiology. Palliative  care following for [place] discussions. Patient is DNR/DNI. She would like to go home soon, so may need to consider optimizing her condition as much as possible in the upcoming days with plan to continue medical management as outpatient. -greatly appreciate assistance from Advanced HF, nephrology, and palliative care   AKI on CKD Stage IV Cardiorenal syndrome Hyponatremia Hyperphosphatemia Unfortunately, she is a poor candidate for dialysis due to multiple comorbidities as per nephrology. However, patient would like to try dialysis if needed. Nephrology may consider time trial of dialysis if patient continues to have worsening kidney function after having stopped dobutamine infusion today. -phosphate 6.8 this AM >> continue sevelamer '1600mg'$  TID with meals -trend renal function panel, avoid nephrotoxic medications    Acute blood loss anemia vs Anemia of chronic disease from above Patient has been having hematochezia. Hgb gradually downtrending, at 6.6 overnight. S/p 1 unit pRBCs with good response to Hgb 7.8. Ferritin 135, Iron 44, Sats 15%. Received IV feraheme x2. Started on ESA as per nephrology. Likely would not benefit from extensive GI workup given poor prognosis from multiple comorbidities (see above).   Prior to Admission Living Arrangement: Home Anticipated Discharge Location: TBD Barriers to Discharge: continued medical management Dispo: Anticipated discharge in approximately 2-3 day(s).   Virl Axe, MD 12/06/2020, 1:07 PM Pager: [phone] After 5pm on weekdays and 1pm on weekends: On Call pager [phone]

## 2020-12-06 NOTE — Progress Notes (Signed)
Physical Therapy Treatment Patient Details Name: [name] [name] MRN: [id number] DOB: 1951/05/20 Today's Date: 12/06/2020    History of Present Illness Pt is a 70 y/o female admitted secondary to CHF exacerbation. s/p RHC 12/03/20. Pt developed hematochezia on 12/05/2020. PMH includes s/p L BKA, CKD, DM, a fib, HTN, CHF, CAD, and s/p pacemaker.    PT Comments    Pt tolerates treatment well with increased tolerance for wheelchair mobility in a single bout. Pt continues to demonstrate improved transfer quality and good dynamic standing balance, performing hygiene tasks in standing during today's session. Pt will continue to benefit from acute PT services to further improve activity tolerance and to aide in a return to the patient's baseline.   Follow Up Recommendations  Home health PT;Supervision - Intermittent     Equipment Recommendations  None recommended by PT    Recommendations for Other Services       Precautions / Restrictions Precautions Precautions: Fall;Other (comment) Precaution Comments: Lft BKA Restrictions Weight Bearing Restrictions: No LLE Weight Bearing:  (L BKA)    Mobility  Bed Mobility Overal bed mobility: Needs Assistance Bed Mobility: Supine to Sit     Supine to sit: Min assist     General bed mobility comments: pt requires assistance to pull into sitting with UE support of PT    Transfers Overall transfer level: Needs assistance Equipment used: None Transfers: Sit to/from [facility] Sit to Stand: Min guard;Supervision Stand pivot transfers: Min guard       General transfer comment: pt prefers wheelchair directly in front on patient, performing 180 degree pivot to transfer  Ambulation/Gait                 Theme park manager mobility: Yes Wheelchair propulsion: Both upper extremities;Right upper extremity;Right lower extremity Wheelchair parts:  Independent Distance: 7' + 150' Wheelchair Assistance Details (indicate cue type and reason): pt utilzing BUEs and then later RUE and RLE  Modified Rankin (Stroke Patients Only)       Balance Overall balance assessment: Needs assistance Sitting-balance support: No upper extremity supported;Feet supported Sitting balance-Leahy Scale: Good     Standing balance support: Single extremity supported Standing balance-Leahy Scale: Poor Standing balance comment: reliant on unilateral UE support but able to perform hygiene tasks in SLS on RLE                            Cognition Arousal/Alertness: Awake/alert Behavior During Therapy: WFL for tasks assessed/performed Overall Cognitive Status: Within Functional Limits for tasks assessed                                        Exercises      General Comments General comments (skin integrity, edema, etc.): pt with blood noted on pad, RN aware of hematochezie      Pertinent Vitals/Pain Pain Assessment: No/denies pain    Home Living                      Prior Function            PT Goals (current goals can now be found in the care plan section) Acute Rehab PT Goals Patient Stated Goal: to go home Progress towards PT goals: Progressing toward goals  Frequency    Min 3X/week      PT Plan Current plan remains appropriate    Co-evaluation              AM-PAC PT "6 Clicks" Mobility   Outcome Measure  Help needed turning from your back to your side while in a flat bed without using bedrails?: A Little Help needed moving from lying on your back to sitting on the side of a flat bed without using bedrails?: A Little Help needed moving to and from a bed to a chair (including a wheelchair)?: A Little Help needed standing up from a chair using your arms (e.g., wheelchair or bedside chair)?: A Little Help needed to walk in hospital room?: A Lot Help needed climbing 3-5 steps with a  railing? : Total 6 Click Score: 15    End of Session   Activity Tolerance: Patient tolerated treatment well Patient left: in bed;with call bell/phone within reach Nurse Communication: Mobility status PT Visit Diagnosis: Unsteadiness on feet (R26.81);Muscle weakness (generalized) (M62.81) Pain - Right/Left: Left     Time: [id number] PT Time Calculation (min) (ACUTE ONLY): 20 min  Charges:  $Therapeutic Activity: 8-22 mins                     Zenaida Niece, PT, DPT Acute Rehabilitation Pager: [phone]    Zenaida Niece 12/06/2020, 2:41 PM

## 2020-12-06 NOTE — Progress Notes (Signed)
Received call from lab for critical hemoglobin of 6.6. Dr. Lisabeth Devoid paged via [facility] communication. Dr. Lisabeth Devoid returned call promptly. Notified of result.

## 2020-12-06 NOTE — Progress Notes (Signed)
This chaplain is present with the Pt., notary, and two witnesses for the notarizing of the Pt. HCPOA.  The Pt. named [name] [name] as her Healthcare Agent; if this person is unwilling or unable to serve [name] will serve as the healthcare agent.  The chaplain gave the Pt. the original HCPOA and two copies. A copy of the Pt. Advance Directive: HCPOA will be scanned into the Pt. EMR.  This chaplain is available for F/U spiritual care as needed.

## 2020-12-06 NOTE — Progress Notes (Addendum)
Patient ID: [name] [name], female   DOB: Jan 28, 1951, 70 y.o.   MRN: [id number]     Advanced Heart Failure Rounding Note  PCP-Cardiologist: No primary care provider on file.    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 Now admitted with worsening HF and AKI likely due to cardiorenal syndrome  Echo EF 30% with severe TR  Started on empiric DBA for suspected low output HF and cardiorenal syndrome + Lasix gtt.   Subjective:    Diuretics held yesterday, Cr 4.54 from 4.69 BUN 116.  Weight is stable, Dobutamine running at 2.5 hgb drifted down to 6.7.  Reports 2 bloody bowel movements this am and one yesterday evening. She received another dose of feraheme yesterday, eliquis stopped getting PRBc's.  Says her breathing is stable.    Patient underwent RHC 3/7 on dobutamine 77mg/kg/min  RA = 11 RV = 54/12 PA = 58/19 (35) PCW = 21 (v = 38) Fick cardiac output/index = 7.8/4.8 PVR = 1.8 WU Ao sat = 99% PA sat = 73%, 73%  Assessment:  1. Biventricular HF with mild to moderate volume overload 2. Prominent v-waves in PCWP tracing c/w severe MR or diastolic dysfunction 3. High cardiac output on dobutamine  Objective:   Weight Range: 56.8 kg Body mass index is 22.92 kg/m.   Vital Signs:   Temp:  [97.6 F (36.4 C)-98.2 F (36.8 C)] 98 F (36.7 C) (03/10 0524) Pulse Rate:  [69-70] 70 (03/10 0524) Resp:  [18-20] 18 (03/10 0524) BP: (97-115)/(45-60) 101/45 (03/10 0524) SpO2:  [94 %-100 %] 94 % (03/10 0524) Weight:  [56.8 kg] 56.8 kg (03/10 0325) Last BM Date: 12/05/20  Weight change: Filed Weights   12/04/20 0333 12/05/20 0334 12/06/20 0325  Weight: 57.7 kg 56.4 kg 56.8 kg    Intake/Output:   Intake/Output Summary (Last 24 hours) at 12/06/2020 0750 Last data filed at 12/06/2020 0509 Gross per 24 hour  Intake 688 ml  Output 1300 ml  Net -612 ml      Physical Exam     Cardiac:  Neck veins flat, normal rate and rhythm, clear s1 and s2, 3/6 TR, rubs or gallops, no LE edema, left bka Pulmonary: bibasilar rales, not in distress Abdominal: soft, no guarding  Psych: Alert, conversant, in good spirits   Telemetry   V-paced 70s Personally reviewed   Labs    CBC Recent Labs    12/05/20 0341 12/06/20 0147  WBC 6.2 6.9  HGB 7.3* 6.6*  HCT 22.5* 20.2*  MCV 89.3 91.4  PLT 163 10000000  Basic Metabolic Panel Recent Labs    12/05/20 0341 12/06/20 0147  NA 134* 136  K 3.4* 3.8  CL 90* 94*  CO2 30 30  GLUCOSE 98 152*  BUN 112* 116*  CREATININE 4.69* 4.54*  CALCIUM 8.8* 8.7*  PHOS 7.2* 6.8*   Liver Function Tests Recent Labs    12/05/20 0341 12/06/20 0147  ALBUMIN 2.5* 2.5*   No results for input(s): LIPASE, AMYLASE in the last 72 hours. Cardiac Enzymes No results for input(s): CKTOTAL, CKMB, CKMBINDEX, TROPONINI in the last 72 hours.  BNP: BNP (last 3 results) Recent Labs    06/20/20 0956 11/21/20 1105  BNP >4,500.0* >4,500.0*    ProBNP (last 3 results) No results for input(s): PROBNP in the last 8760 hours.   D-Dimer No results for input(s): DDIMER in the last  72 hours. Hemoglobin A1C No results for input(s): HGBA1C in the last 72 hours. Fasting Lipid Panel No results for input(s): CHOL, HDL, LDLCALC, TRIG, CHOLHDL, LDLDIRECT in the last 72 hours. Thyroid Function Tests No results for input(s): TSH, T4TOTAL, T3FREE, THYROIDAB in the last 72 hours.  Invalid input(s): FREET3  Other results:   Imaging    No results found.   Medications:     Scheduled Medications: . atorvastatin  40 mg Oral Daily  . calcitRIOL  0.5 mcg Oral Daily  . [START ON 12/07/2020] darbepoetin (ARANESP) injection - NON-DIALYSIS  60 mcg Subcutaneous Q Fri-1800  . potassium chloride  40 mEq Oral Once  . sevelamer carbonate  1,600 mg Oral TID WC  . sodium chloride flush  3 mL Intravenous Q12H  . sodium chloride flush  3 mL Intravenous Q12H   . torsemide  60 mg Oral BID    Infusions: . sodium chloride    . DOBUTamine 2.5 mcg/kg/min (12/06/20 0250)    PRN Medications: sodium chloride, acetaminophen, albuterol, diclofenac Sodium, HYDROmorphone (DILAUDID) injection, HYDROmorphone, hydrOXYzine, loperamide, ondansetron (ZOFRAN) IV, polyethylene glycol, sodium chloride flush    Assessment/Plan    1. A/C Systolic Heart Failure - Etiology unclear possibly from RV pacing?  Has [facility] Jude PPM - Myoview 08/2020  Showed EF 30% with no signs of ischemia. Unable to cath with CKD. No chest pain.  -ECHO this admit with EF 30-35% RV normal. Severe TR - Continue weaning dobutamine now at 2.5, will turn off today - volume status and breathing has improved with diuresis and dobutamine support but with worsening renal function - oral diuretics ordered to restart today, nephrology managing - Poor candidate for dialysis  - Off Coreg w/ suspected low output.   - No dig/spir/arni with elevated creatinine.   2.  CKD Stage IV - Creatinine baseline  2.9-3.3 - Cr 4.54 from 4.69 , diuretics held yesterday, she has low oncotic pressure low hgb, likely has nephrotic syndrome, also may not be tolerating diuresis due to intrinsic renal disease, see if she will stabilize -maintenance diuretic planned to restart today nephrology managing - Nephrology following. They also feel she would have a hard time tolerating iHD but given her goals of care are willing to do a trial if necessary  3. PAF - In NSR - eliquis held for GI bleed and low hgb  4. 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   5. PAD -H/O LBKA   6. Hyperthyroidism  - Thyroid US 11/12/20 with numerous small nodules  -11/22/20 TSH  0.014 Free T3 1.3   7. Severe TR -Noted on ECHO   8. Hyperkalemia  -resolved  9. Blood loss Anemia with concomitant anemia of chronic disease - GI blood loss, having melanic  stools - Hgb has been drifting down from 10 ->8.2->7.7>6.7 - Ferritin 135, Iron 44, Sats 15% - Received IV Feraheme x2, nephro giving ESA -PRBC's ordered today, eliquis held   10. CAD  - mild CAD on cath 2005  - Myoview 2021 no signs of ischemia - On statin + eliquis but eliquis held with GI bleeding  11. Social  Referred to SW for assistance with financial difficulties.    Length of Stay: Craig, MD  12/06/2020, 7:50 AM  Advanced Heart Failure Team Pager [phone] (M-F; [place])  Please contact [facility] for night-coverage after hours (4p -7a )  and weekends on [email or web address]  Patient seen and examined with the above-signed Advanced Practice Provider and/or Housestaff. I personally reviewed laboratory data, imaging studies and relevant notes. I independently examined the patient and formulated the important aspects of the plan. I have edited the note to reflect any of my changes or salient points. I have personally discussed the plan with the patient and/or family.  Had bloody BMs overnight with hgb down to 6.6 Got RBCS today. Feels fatigued denies SOB, Creatinine minimally improved  General:  Lying in bed . No resp difficulty HEENT: normal Neck: supple. JVP to jaw Carotids 2+ bilat; no bruits. No lymphadenopathy or thryomegaly appreciated. Cor: PMI nondisplaced. Regular rate & rhythm. 2/6 TR Lungs: clear Abdomen: soft, nontender, nondistended. No hepatosplenomegaly. No bruits or masses. Good bowel sounds. Extremities: no cyanosis, clubbing, rash, tr edema s/p L BKA Neuro: alert & orientedx3, cranial nerves grossly intact. moves all 4 extremities w/o difficulty. Affect pleasant  Volume status is much improved. We will stop dobutamine today with no plans to restart. Agree with restarting oral diuretics once bleeding is stable. Stop Eliquis for now. Hopefully creatinine will trend down and we can get her home. Would strongly recommend hospice support at d/c.    Glori Bickers, MD  7:44 PM

## 2020-12-07 ENCOUNTER — Encounter (HOSPITAL_COMMUNITY): Payer: Self-pay | Admitting: Internal Medicine

## 2020-12-07 DIAGNOSIS — N184 Chronic kidney disease, stage 4 (severe): Secondary | ICD-10-CM | POA: Diagnosis not present

## 2020-12-07 DIAGNOSIS — I130 Hypertensive heart and chronic kidney disease with heart failure and stage 1 through stage 4 chronic kidney disease, or unspecified chronic kidney disease: Secondary | ICD-10-CM | POA: Diagnosis not present

## 2020-12-07 DIAGNOSIS — I5023 Acute on chronic systolic (congestive) heart failure: Secondary | ICD-10-CM | POA: Diagnosis not present

## 2020-12-07 DIAGNOSIS — I5082 Biventricular heart failure: Secondary | ICD-10-CM | POA: Diagnosis not present

## 2020-12-07 LAB — TYPE AND SCREEN
ABO/RH(D): O POS
Antibody Screen: NEGATIVE
Unit division: 0

## 2020-12-07 LAB — CBC
HCT: 20.2 % — ABNORMAL LOW (ref 36.0–46.0)
HCT: 22.3 % — ABNORMAL LOW (ref 36.0–46.0)
HCT: 22.7 % — ABNORMAL LOW (ref 36.0–46.0)
Hemoglobin: 6.6 g/dL — CL (ref 12.0–15.0)
Hemoglobin: 7.3 g/dL — ABNORMAL LOW (ref 12.0–15.0)
Hemoglobin: 7.3 g/dL — ABNORMAL LOW (ref 12.0–15.0)
MCH: 29.9 pg (ref 26.0–34.0)
MCH: 29.8 pg (ref 26.0–34.0)
MCH: 30.2 pg (ref 26.0–34.0)
MCHC: 32.7 g/dL (ref 30.0–36.0)
MCHC: 32.7 g/dL (ref 30.0–36.0)
MCHC: 32.2 g/dL (ref 30.0–36.0)
MCV: 91.4 fL (ref 80.0–100.0)
MCV: 91 fL (ref 80.0–100.0)
MCV: 93.8 fL (ref 80.0–100.0)
Platelets: 158 10*3/uL (ref 150–400)
Platelets: 154 10*3/uL (ref 150–400)
Platelets: 145 10*3/uL — ABNORMAL LOW (ref 150–400)
RBC: 2.21 MIL/uL — ABNORMAL LOW (ref 3.87–5.11)
RBC: 2.45 MIL/uL — ABNORMAL LOW (ref 3.87–5.11)
RBC: 2.42 MIL/uL — ABNORMAL LOW (ref 3.87–5.11)
RDW: 17.5 % — ABNORMAL HIGH (ref 11.5–15.5)
RDW: 16.9 % — ABNORMAL HIGH (ref 11.5–15.5)
RDW: 17.5 % — ABNORMAL HIGH (ref 11.5–15.5)
WBC: 6.9 10*3/uL (ref 4.0–10.5)
WBC: 8 10*3/uL (ref 4.0–10.5)
WBC: 8.7 10*3/uL (ref 4.0–10.5)
nRBC: 0 % (ref 0.0–0.2)
nRBC: 0 % (ref 0.0–0.2)
nRBC: 0 % (ref 0.0–0.2)

## 2020-12-07 LAB — GLUCOSE, CAPILLARY
Glucose-Capillary: 81 mg/dL (ref 70–99)
Glucose-Capillary: 167 mg/dL — ABNORMAL HIGH (ref 70–99)
Glucose-Capillary: 176 mg/dL — ABNORMAL HIGH (ref 70–99)
Glucose-Capillary: 120 mg/dL — ABNORMAL HIGH (ref 70–99)

## 2020-12-07 LAB — BASIC METABOLIC PANEL
Anion gap: 13 (ref 5–15)
BUN: 115 mg/dL — ABNORMAL HIGH (ref 8–23)
CO2: 28 mmol/L (ref 22–32)
Calcium: 9 mg/dL (ref 8.9–10.3)
Chloride: 96 mmol/L — ABNORMAL LOW (ref 98–111)
Creatinine, Ser: 4.35 mg/dL — ABNORMAL HIGH (ref 0.44–1.00)
GFR, Estimated: 11 mL/min — ABNORMAL LOW (ref >60)
Glucose, Bld: 159 mg/dL — ABNORMAL HIGH (ref 70–99)
Potassium: 4 mmol/L (ref 3.5–5.1)
Sodium: 137 mmol/L (ref 135–145)

## 2020-12-07 LAB — BPAM RBC
Blood Product Expiration Date: 202204112359
ISSUE DATE / TIME: 202203100459
Unit Type and Rh: 5100

## 2020-12-07 LAB — PHOSPHORUS: Phosphorus: 6.6 mg/dL — ABNORMAL HIGH (ref 2.5–4.6)

## 2020-12-07 NOTE — Progress Notes (Addendum)
Patient ID: [name] [name], female   DOB: 1950-10-01, 70 y.o.   MRN: [id number] S: Very tired today.  Resting.  No complaints O:BP (!) 135/51 (BP Location: Right Arm)   Pulse 70   Temp 97.9 F (36.6 C) (Oral)   Resp 16   Wt 56.4 kg Comment: scale b  SpO2 98%   BMI 22.75 kg/m   Intake/Output Summary (Last 24 hours) at 12/07/2020 1109 Last data filed at 12/07/2020 0857 Gross per 24 hour  Intake 320 ml  Output 1101 ml  Net -781 ml   Intake/Output: I/O last 3 completed shifts: In: 1324.7 [P.O.:440; I.V.:125.1; Blood:759.6] Out: 1401 [Urine:1400; Stool:1]  Intake/Output this shift:  Total I/O In: 120 [P.O.:120] Out: 500 [Urine:500] Weight change: -0.408 kg Gen: Appears chronically ill, lying in bed, no distress CVS: Normal rate, no peripheral edema Resp: Bilateral chest rise, mild increased work of breathing Abd: Mild distention Ext: Warm and well perfused  Recent Labs  Lab 12/01/20 0420 12/02/20 0308 12/03/20 0423 12/03/20 1403 12/03/20 1404 12/03/20 1408 12/04/20 0402 12/05/20 0341 12/06/20 0147 12/07/20 0641  NA 132* 128* 129* 131* 135 131* 131* 134* 136 137  K 4.7 4.3 4.2 3.8 3.5 3.8 3.8 3.4* 3.8 4.0  CL 94* 88* 86*  --   --   --  87* 90* 94* 96*  CO2 '24 25 29  '$ --   --   --  '29 30 30 28  '$ GLUCOSE 93 100* 89  --   --   --  95 98 152* 159*  BUN 83* 85* 91*  --   --   --  101* 112* 116* 115*  CREATININE 3.63* 3.56* 4.25*  --   --   --  4.52* 4.69* 4.54* 4.35*  ALBUMIN 2.6* 2.7* 2.5*  --   --   --  2.6* 2.5* 2.5*  --   CALCIUM 9.3 9.2 9.6  --   --   --  9.4 8.8* 8.7* 9.0  PHOS 7.2* 7.1* 7.5*  --   --   --  8.1* 7.2* 6.8*  --    Liver Function Tests: Recent Labs  Lab 12/04/20 0402 12/05/20 0341 12/06/20 0147  ALBUMIN 2.6* 2.5* 2.5*   No results for input(s): LIPASE, AMYLASE in the last 168 hours. No results for input(s): AMMONIA in the last 168 hours. CBC: Recent Labs  Lab 12/05/20 0341 12/06/20 0147 12/06/20 1008 12/06/20 1440 12/07/20 0245  WBC  6.2 6.9 7.4 8.4 8.0  NEUTROABS  --   --   --  5.7  --   HGB 7.3* 6.6* 7.8* 8.0* 7.3*  HCT 22.5* 20.2* 22.9* 23.2* 22.3*  MCV 89.3 91.4 89.5 89.2 91.0  PLT 163 158 162 168 154   Cardiac Enzymes: No results for input(s): CKTOTAL, CKMB, CKMBINDEX, TROPONINI in the last 168 hours. CBG: Recent Labs  Lab 12/06/20 0617 12/06/20 1104 12/06/20 1632 12/06/20 2112 12/07/20 0541  GLUCAP 119* 143* 157* 117* 81    Iron Studies:  No results for input(s): IRON, TIBC, TRANSFERRIN, FERRITIN in the last 72 hours. Studies/Results: No results found. Marland Kitchen atorvastatin  40 mg Oral Daily  . calcitRIOL  0.5 mcg Oral Daily  . darbepoetin (ARANESP) injection - NON-DIALYSIS  60 mcg Subcutaneous Q Fri-1800  . sevelamer carbonate  1,600 mg Oral TID WC  . sodium chloride flush  3 mL Intravenous Q12H  . sodium chloride flush  3 mL Intravenous Q12H  . torsemide  60 mg Oral BID    BMET  Component Value Date/Time   NA 137 12/07/2020 0641   NA 142 11/08/2020 1039   K 4.0 12/07/2020 0641   CL 96 (L) 12/07/2020 0641   CO2 28 12/07/2020 0641   GLUCOSE 159 (H) 12/07/2020 0641   BUN 115 (H) 12/07/2020 0641   BUN 21 11/08/2020 1039   CREATININE 4.35 (H) 12/07/2020 0641   CREATININE 2.26 (H) 04/07/2018 1330   CALCIUM 9.0 12/07/2020 0641   CALCIUM 9.3 08/28/2011 1648   GFRNONAA 11 (L) 12/07/2020 0641   GFRNONAA 29 (L) 11/10/2012 1115   GFRAA 28 (L) 11/08/2020 1039   GFRAA 33 (L) 11/10/2012 1115   CBC    Component Value Date/Time   WBC 8.0 12/07/2020 0245   RBC 2.45 (L) 12/07/2020 0245   HGB 7.3 (L) 12/07/2020 0245   HGB 11.7 11/25/2018 0931   HCT 22.3 (L) 12/07/2020 0245   HCT 35.8 11/25/2018 0931   PLT 154 12/07/2020 0245   PLT 146 (L) 11/25/2018 0931   MCV 91.0 12/07/2020 0245   MCV 93 11/25/2018 0931   MCH 29.8 12/07/2020 0245   MCHC 32.7 12/07/2020 0245   RDW 16.9 (H) 12/07/2020 0245   RDW 13.1 11/25/2018 0931   LYMPHSABS 0.9 12/06/2020 1440   LYMPHSABS 2.0 11/25/2018 0931   MONOABS  1.0 12/06/2020 1440   EOSABS 0.6 (H) 12/06/2020 1440   EOSABS 0.7 (H) 11/25/2018 0931   BASOSABS 0.0 12/06/2020 1440   BASOSABS 0.1 11/25/2018 0931    Assessment/Plan: 1. AKI/CKD stage IV in setting of decompensated systolic and diastolic CHF with escalating doses of IV lasix. Now more euvolemic with oral diuretics ordered.  AKI likely cardiorenal syndrome. Creatinine continues to be slightly worse today 1. Continue with oral diuretics 2. Off dobutamine 3. Maintain current volume status 4. Creatinine slightly improved today 5. I have personally had a long conversation with the patient as well as palliative care.  She is DNR but wants to try dialysis if needed.  She really wants to leave the hospital and we discussed that she cannot do this until her kidney function is improving off of dobutamine 6. Continue to hold on dialysis.  Hopefully can leave the hospital and we can have further discussions regarding dialysis then.   2. Acute on chronic systolic and diastolic CHF- profound volume overload initially. EF 30% with severe TR.now significantly improved volume status.  Heart failure managing.  Off dobutamine 3. Complete heart block - s/p PPM, high percentage of RV pacing per Cardiology 4. P. Afib -normal rates at this time 5. Severe protein malnutrition -encourage p.o. intake 6. Moderate MR and severe TR- as above recommend heart failure team consultation 7. Hyponatremia - due to volume overload.Resolved 8. Hyperphosphatemia: Continue sevelamer 3 times daily.  Add on phosphorus today 9. Anemia of CKD stage IV- cont to follow and transfuse prn.She has been given Feraheme and is on ESA.  Status post transfusion on 3/10 10. Disposition - poor overall prognosis as above.  Palliative care assisting.  If crt imporving tomorrow off dobut she is likely okay for DC from my standpoint.

## 2020-12-07 NOTE — Progress Notes (Signed)
OT Cancellation Note  Patient Details Name: Dorissa Lonzo MRN: [id number] DOB: 1951/07/16   Cancelled Treatment:    Reason Eval/Treat Not Completed: Other (comment). Second attempt to see pt today. First attempt pt asking to sleep a bit longer and pt now with arrival of lunch tray. Plan to reattempt as able.   Tyrone Schimke, OT Acute Rehabilitation Services Pager: [phone] Office: [phone]  12/07/2020, 12:04 PM

## 2020-12-07 NOTE — Discharge Summary (Signed)
Name: [name] [name] MRN: [id number] DOB: 01-07-1951 70 y.o. PCP: [name], MD  Date of Admission: 11/21/2020  2:08 PM Date of Discharge: 12/08/2020 Attending Physician: Axel Filler, *  Discharge Diagnosis: 1. 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 CAD with sinus arrest s/p pacemaker.  2. AKI on CKD Stage IV. Cardiorenal syndrome  3. acute on chronic anemia    Discharge Medications: Allergies as of 12/08/2020      Reactions   Penicillins Itching   Did it involve swelling of the face/tongue/throat, SOB, or low BP? No Did it involve sudden or severe rash/hives, skin peeling, or any reaction on the inside of your mouth or nose? No Did you need to seek medical attention at a hospital or doctor's office? No When did it last happen?10 years If all above answers are "NO", may proceed with cephalosporin use.      Medication List    STOP taking these medications   BiDil 20-37.5 MG tablet Generic drug: isosorbide-hydrALAZINE   carvedilol 12.5 MG tablet Commonly known as: COREG   empagliflozin 10 MG Tabs tablet Commonly known as: JARDIANCE   furosemide 80 MG tablet Commonly known as: Lasix   gabapentin 300 MG capsule Commonly known as: NEURONTIN   loperamide 2 MG capsule Commonly known as: IMODIUM   potassium chloride 10 MEQ tablet Commonly known as: KLOR-CON   sodium bicarbonate 650 MG tablet   valsartan 80 MG tablet Commonly known as: DIOVAN   Xarelto 15 MG Tabs tablet Generic drug: Rivaroxaban     TAKE these medications   albuterol 108 (90 Base) MCG/ACT inhaler Commonly known as: ProAir HFA Inhale 2 puffs into the lungs every 6 (six) hours as needed for wheezing or shortness of breath (cough).   atorvastatin 40 MG tablet Commonly known as: LIPITOR Take 1 tablet (40 mg total) by mouth daily.   calcitRIOL 0.5 MCG capsule Commonly known as: ROCALTROL Take 1 capsule (0.5 mcg total) by  mouth daily. What changed: Another medication with the same name was removed. Continue taking this medication, and follow the directions you see here.   diclofenac Sodium 1 % Gel Commonly known as: VOLTAREN Apply 2 g topically 4 (four) times daily.   hydrocerin Crea Apply 1 application topically 2 (two) times daily.   hydrOXYzine 10 MG tablet Commonly known as: ATARAX/VISTARIL Take 1 tablet (10 mg total) by mouth 2 (two) times daily as needed for itching.   pantoprazole 40 MG tablet Commonly known as: PROTONIX Take 1 tablet (40 mg total) by mouth daily.   sevelamer carbonate 800 MG tablet Commonly known as: RENVELA Take 2 tablets (1,600 mg total) by mouth 3 (three) times daily with meals.   Torsemide 60 MG Tabs Take 60 mg by mouth daily. Start taking on: December 09, 2020   Travoprost (BAK Free) 0.004 % Soln ophthalmic solution Commonly known as: TRAVATAN Place 1 drop into both eyes at bedtime.       Disposition and follow-up:   [name] [name] was discharged from [facility] in fair condition.  At the hospital follow up visit please address:  Patient spent 16 days in the hospital for acute on chronic heart failure with reduced ejection fraction complicated by cardiorenal syndrome.  Patient also had hematochezia and melena on stay. Her Eliquis was stopped which she is on for paroxysmal atrial fibrillation.  I recommend not restarting at this time.  Patient has CKD stage V and progressing toward  ESRD.  Her heart failure was optimized on admission by her heart failure team.  Ultimately she is not a candidate for ACE/ARB/Arni because of CKD or beta-blocker in setting of low output heart failure.  Nephrology is going to consider HD.  Patient is likely not a good candidate for outpatient hospice as she does not have 24-hour support and has hopes of hemodialysis on her prolong her life.  A outpatient palliative care referral was placed but patient was reported to  deny this by social work.   At follow-up please assess volume status and adjust torsemide dose as needed Check CBC for anemia Discuss palliative care referral, given patient has refused discharge Discussed follow-up with heart failure and nephrology  4.  Labs / imaging needed at time of follow-up: CBC, BMP  5.  Pending labs/ test needing follow-up: None  Follow-up Appointments:  Follow-up Information    [facility] Follow up.   Specialty: Home Health Services Why: HHPT, HHOT,  [place] [address]        [name], MD.   Specialty: Internal Medicine Contact information: [address] [place] [address] [phone]        [facility] Follow up on 12/24/2020.   Specialty: Cardiology Why: 1200  Contact information: [id number] [place] [phone]       [facility] [place] Follow up.   Why: Dr. Joylene Grapes will arrange for you to be scheduled.  If you do not hear from [facility] in 1 week call their office Contact information: [place] [address] [phone]               Hospital Course by problem list: 1. 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 CAD with sinus arrest s/p pacemaker. Presented with increasing dyspnea and weight gain since discharge on 11/14/2020, readmitted for acute decompensated HFrEF. ECHO showing LVEF 30-35%, global hypokinesis, grade II diastolic dysfunction, moderately elevated pulmonary artery systolic pressure, severe left atrial dilation, moderate right atrial dilation, severe tricuspid regurgitation. Cardiology ([facility]) and Nephrology consulted to help with aggressive diuresis in the setting of worsening kidney function. Home coreg stopped due to low output HF as per cardiology. Did not improve despite aggressive diuresis with IV lasix, metolazone, and  dobutamine infusion. Right heart catheterization (12/03/2020) showing biventricular HF with volume overload. Ultimately began to offload volume with total ~15L since admission. Transitioned back to home torsemide '60mg'$  BID as cardiology advised that we would not be able to offload more volume at this time given other comorbidities. Dobutamine infusion stopped on 12/06/2020. Kidney function stabilized off of dobutamine. Patient had increased output on BID dosing, discharged on torsemide 60 mg once daily  Palliative care was consulted for goals of care discussions. As per patient's wishes, her CODE status was changed to DNR/DNI. Her best friend, Ihor Austin, was designated to be her Press photographer. Physical therapy worked with patient throughout, recommended home health services with PT and intermittent supervision.  2. AKI on CKD Stage IV. Cardiorenal syndrome. Related to heart failure (see above). She is a poor candidate for dialysis, as per nephrology, given multiple comorbidities. However, patient's wishes were to keep dialysis as a last resort. On admission, Cr 2.8 (GFR 18). Continued to significantly worsen in the setting of aggressive diuresis despite inotropic support. Started on sevelamer '1600mg'$  TID with meals due to persistent hyperphosphatemia, and sevelamer  will need to be continued as outpatient. Also had persistent hyponatremia from volume overload. Intermittent hypokalemia seen in the setting of aggressive diuresis, so required potassium repletion repeatedly. Kidney function stabilized after dobutamine infusion was stopped, but stayed around Cr 4.3 (GFR 11). As per nephrology, can further discuss possibility of dialysis as an outpatient.   3. Acute blood loss anemia vs acute on chronic anemia from above. During course of admission, patient began having some hematochezia and melena. Hgb gradually downtrended to 6.6 over the course of weeks, requiring 1 unit of pRBCs with good response to  Hgb 8. Home eliquis was held. Ferritin 135, Iron 44, Sats 15%. Received IV feraheme x2. Started on ESA as per nephrology for suspected acute on chronic anemia from severe renal disease. Possibility of slow GI bleed remains, but patient not a candidate for extensive GI workup given extensive comorbidities with very poor prognosis.  Hemoglobin stable at discharge and no report of melena or hematochezia overnight    Discharge Exam:   BP 115/64 (BP Location: Right Arm)   Pulse 69   Temp 98 F (36.7 C)   Resp 16   Wt 55.2 kg   SpO2 100%   BMI 22.24 kg/m  Discharge exam:  General: Chronically ill-appearing woman, lying in bed in no acute distress CV: Regular rate and rhythm, 3/6 holosystolic murmur left lower sternal border.  JVD Abdomen: Normal bowel sounds, soft, nontender Extremity: Left BKA, no lower extremity edema  Pertinent Labs, Studies, and Procedures:  CBC Latest Ref Rng & Units 12/08/2020 12/07/2020 12/07/2020  WBC 4.0 - 10.5 K/uL 8.6 8.7 8.0  Hemoglobin 12.0 - 15.0 g/dL 7.2(L) 7.3(L) 7.3(L)  Hematocrit 36.0 - 46.0 % 21.9(L) 22.7(L) 22.3(L)  Platelets 150 - 400 K/uL 157 145(L) 154   CMP Latest Ref Rng & Units 12/08/2020 12/07/2020 12/06/2020  Glucose 70 - 99 mg/dL 117(H) 159(H) 152(H)  BUN 8 - 23 mg/dL 119(H) 115(H) 116(H)  Creatinine 0.44 - 1.00 mg/dL 4.25(H) 4.35(H) 4.54(H)  Sodium 135 - 145 mmol/L 137 137 136  Potassium 3.5 - 5.1 mmol/L 3.7 4.0 3.8  Chloride 98 - 111 mmol/L 95(L) 96(L) 94(L)  CO2 22 - 32 mmol/L '29 28 30  '$ Calcium 8.9 - 10.3 mg/dL 9.0 9.0 8.7(L)  Total Protein 6.5 - 8.1 g/dL - - -  Total Bilirubin 0.3 - 1.2 mg/dL - - -  Alkaline Phos 38 - 126 U/L - - -  AST 15 - 41 U/L - - -  ALT 0 - 44 U/L - - -   BNP    Component Value Date/Time   BNP >4,500.0 (H) 11/21/2020 1105    Iron/TIBC/Ferritin/ %Sat    Component Value Date/Time   IRON 44 11/29/2020 1715   IRON 52 08/03/2018 1004   TIBC 293 11/29/2020 1715   TIBC 168 (L) 08/03/2018 1004   FERRITIN 135  11/29/2020 1715   FERRITIN 444 (H) 08/03/2018 1004   IRONPCTSAT 15 11/29/2020 1715   IRONPCTSAT 31 08/03/2018 1004   Urinalysis    Component Value Date/Time   COLORURINE YELLOW 11/28/2020 1800   APPEARANCEUR CLEAR 11/28/2020 1800   LABSPEC 1.005 11/28/2020 1800   PHURINE 5.0 11/28/2020 1800   GLUCOSEU NEGATIVE 11/28/2020 1800   GLUCOSEU 100 (A) 03/18/2010 2254   HGBUR SMALL (A) 11/28/2020 1800   BILIRUBINUR NEGATIVE 11/28/2020 1800   KETONESUR NEGATIVE 11/28/2020 1800   PROTEINUR NEGATIVE 11/28/2020 1800   UROBILINOGEN 0.2 11/02/2014 2322   NITRITE NEGATIVE 11/28/2020 1800   LEUKOCYTESUR NEGATIVE 11/28/2020  1800   Lipid Panel     Component Value Date/Time   CHOL 96 11/28/2020 1646   TRIG 32 11/28/2020 1646   HDL 58 11/28/2020 1646   CHOLHDL 1.7 11/28/2020 1646   VLDL 6 11/28/2020 1646   LDLCALC 32 11/28/2020 1646   DG Chest 2 View  Result Date: 11/21/2020 CLINICAL DATA:  Shortness of breath. EXAM: CHEST - 2 VIEW COMPARISON:  11/11/2020. FINDINGS: Cardiac pacer in stable position. Cardiomegaly. Mild bilateral interstitial prominence and small bilateral pleural effusions. Findings suggest CHF. Pneumonitis cannot be excluded. Findings have increased slightly from prior exam. IMPRESSION: Cardiac pacer in stable position. Cardiomegaly. Mild bilateral interstitial prominence and small bilateral pleural effusions. Findings suggest CHF. Findings have increased slightly from prior exam. Electronically Signed   By: [place]   On: 11/21/2020 13:14   ECHOCARDIOGRAM COMPLETE  Result Date: 11/22/2020    ECHOCARDIOGRAM REPORT   Patient Name:   [name] [name] Date of Exam: 11/22/2020 Medical Rec #:  [id number]          Height:       62.0 in Accession #:    [id number]         Weight:       142.2 lb Date of Birth:  21-Oct-1950          BSA:          1.654 m Patient Age:    30 years           BP:           141/56 mmHg Patient Gender: F                  HR:           70 bpm. Exam Location:   Inpatient Procedure: 2D Echo, Cardiac Doppler and Color Doppler Indications:    I50.9* Heart failure (unspecified)  History:        Patient has prior history of Echocardiogram examinations, most                 recent 06/29/2020. CHF, Pacemaker, Arrythmias:Atrial                 Fibrillation; Risk Factors:Hypertension, Diabetes and                 Dyslipidemia.  Sonographer:    Raquel Sarna Senior RDCS Referring Phys: [id number] Raynaldo Opitz RAINES IMPRESSIONS  1. Left ventricular ejection fraction, by estimation, is 30 to 35%. The left ventricle has moderately decreased function. The left ventricle demonstrates global hypokinesis. Left ventricular diastolic parameters are consistent with Grade II diastolic dysfunction (pseudonormalization).  2. Right ventricular systolic function is normal. The right ventricular size is normal. There is moderately elevated pulmonary artery systolic pressure. The estimated right ventricular systolic pressure is 99991111 mmHg.  3. Left atrial size was severely dilated.  4. Right atrial size was moderately dilated.  5. The mitral valve is normal in structure. Mild mitral valve regurgitation. No evidence of mitral stenosis.  6. Tricuspid valve regurgitation is severe.  7. The aortic valve is normal in structure. Aortic valve regurgitation is not visualized. No aortic stenosis is present.  8. The inferior vena cava is normal in size with greater than 50% respiratory variability, suggesting right atrial pressure of 3 mmHg. Comparison(s): LV mildly improved. FINDINGS  Left Ventricle: Left ventricular ejection fraction, by estimation, is 30 to 35%. The left ventricle has moderately decreased function. The left ventricle demonstrates global hypokinesis. The left ventricular  internal cavity size was normal in size. There is no left ventricular hypertrophy. Left ventricular diastolic parameters are consistent with Grade II diastolic dysfunction (pseudonormalization). Right Ventricle: The right ventricular  size is normal. No increase in right ventricular wall thickness. Right ventricular systolic function is normal. There is moderately elevated pulmonary artery systolic pressure. The tricuspid regurgitant velocity is 3.26 m/s, and with an assumed right atrial pressure of 15 mmHg, the estimated right ventricular systolic pressure is 99991111 mmHg. Left Atrium: Left atrial size was severely dilated. Right Atrium: Right atrial size was moderately dilated. Pericardium: There is no evidence of pericardial effusion. Mitral Valve: The mitral valve is normal in structure. Mild mitral valve regurgitation. No evidence of mitral valve stenosis. MV peak gradient, 6.2 mmHg. The mean mitral valve gradient is 3.0 mmHg. Tricuspid Valve: The tricuspid valve is normal in structure. Tricuspid valve regurgitation is severe. No evidence of tricuspid stenosis. Aortic Valve: The aortic valve is normal in structure. Aortic valve regurgitation is not visualized. No aortic stenosis is present. Pulmonic Valve: The pulmonic valve was normal in structure. Pulmonic valve regurgitation is not visualized. No evidence of pulmonic stenosis. Aorta: The aortic root is normal in size and structure. Venous: The inferior vena cava is normal in size with greater than 50% respiratory variability, suggesting right atrial pressure of 3 mmHg. IAS/Shunts: No atrial level shunt detected by color flow Doppler. Additional Comments: A pacer wire is visualized.  LEFT VENTRICLE PLAX 2D LVIDd:         4.80 cm     Diastology LVIDs:         3.40 cm     LV e' medial:    4.43 cm/s LV PW:         1.00 cm     LV E/e' medial:  25.1 LV IVS:        1.00 cm     LV e' lateral:   5.48 cm/s LVOT diam:     1.90 cm     LV E/e' lateral: 20.3 LV SV:         43 LV SV Index:   26 LVOT Area:     2.84 cm  LV Volumes (MOD) LV vol d, MOD A2C: 77.2 ml LV vol d, MOD A4C: 75.2 ml LV vol s, MOD A2C: 46.4 ml LV vol s, MOD A4C: 48.5 ml LV SV MOD A2C:     30.8 ml LV SV MOD A4C:     75.2 ml LV SV MOD  BP:      26.5 ml RIGHT VENTRICLE RV S prime:     8.40 cm/s TAPSE (M-mode): 2.0 cm LEFT ATRIUM             Index       RIGHT ATRIUM           Index LA diam:        4.40 cm 2.66 cm/m  RA Area:     17.00 cm LA Vol (A2C):   69.0 ml 41.72 ml/m RA Volume:   45.70 ml  27.63 ml/m LA Vol (A4C):   80.2 ml 48.50 ml/m LA Biplane Vol: 76.5 ml 46.26 ml/m  AORTIC VALVE LVOT Vmax:   68.50 cm/s LVOT Vmean:  46.200 cm/s LVOT VTI:    0.151 m  AORTA Ao Root diam: 3.10 cm Ao Asc diam:  3.20 cm MITRAL VALVE                TRICUSPID VALVE MV Area (PHT): 4.31 cm  TR Peak grad:   42.5 mmHg MV Area VTI:   1.28 cm     TR Vmax:        326.00 cm/s MV Peak grad:  6.2 mmHg MV Mean grad:  3.0 mmHg     SHUNTS MV Vmax:       1.24 m/s     Systemic VTI:  0.15 m MV Vmean:      76.9 cm/s    Systemic Diam: 1.90 cm MV Decel Time: 176 msec MV E velocity: 111.00 cm/s MV A velocity: 53.60 cm/s MV E/A ratio:  2.07 Candee Furbish MD Electronically signed by Candee Furbish MD Signature Date/Time: 11/22/2020/1:29:09 PM    Final    VAS [place] UPPER EXTREMITY VENOUS DUPLEX  Result Date: 11/22/2020 UPPER VENOUS STUDY  Indications: Swelling Risk Factors: None identified. Comparison Study: No previous Performing Technologist: Vonzell Schlatter RVT  Examination Guidelines: A complete evaluation includes B-mode imaging, spectral Doppler, color Doppler, and power Doppler as needed of all accessible portions of each vessel. Bilateral testing is considered an integral part of a complete examination. Limited examinations for reoccurring indications may be performed as noted.  Left Findings: +----------+------------+---------+-----------+----------+-------+ LEFT      CompressiblePhasicitySpontaneousPropertiesSummary +----------+------------+---------+-----------+----------+-------+ IJV           Full       Yes       Yes                      +----------+------------+---------+-----------+----------+-------+ Subclavian    Full       Yes       Yes                       +----------+------------+---------+-----------+----------+-------+ Axillary      Full       Yes       Yes                      +----------+------------+---------+-----------+----------+-------+ Brachial      Full       Yes       Yes                      +----------+------------+---------+-----------+----------+-------+ Radial        Full                                          +----------+------------+---------+-----------+----------+-------+ Ulnar         Full                                          +----------+------------+---------+-----------+----------+-------+ Cephalic      Full                                          +----------+------------+---------+-----------+----------+-------+ Basilic       Full                                          +----------+------------+---------+-----------+----------+-------+  Summary:  Left: No evidence of deep vein thrombosis in the upper extremity.  No evidence of superficial vein thrombosis in the upper extremity.  *See table(s) above for measurements and observations.  Diagnosing physician: Jamelle Haring Electronically signed by Jamelle Haring on 11/22/2020 at 8:13:33 PM.    Final     Discharge Instructions: Discharge Instructions    ([place]) Call MD:  Anytime you have any of the following symptoms: 1) 3 pound weight gain in 24 hours or 5 pounds in 1 week 2) shortness of breath, with or without a dry hacking cough 3) swelling in the hands, feet or stomach 4) if you have to sleep on extra pillows at night in order to breathe.   Complete by: As directed    Amb Referral to Palliative Care   Complete by: As directed    Diet - low sodium heart healthy   Complete by: As directed    Increase activity slowly   Complete by: As directed       Signed: Madalyn Rob, MD 12/08/2020, 12:16 PM   Pager: [phone]

## 2020-12-07 NOTE — Progress Notes (Addendum)
Patient ID: [name] [name], female   DOB: December 07, 1950, 70 y.o.   MRN: [id number]     Advanced Heart Failure Rounding Note  PCP-Cardiologist: No primary care provider on file.    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 Now admitted with worsening HF and AKI likely due to cardiorenal syndrome  Echo EF 30% with severe TR  Started on empiric DBA for suspected low output HF and cardiorenal syndrome + Lasix gtt.   Subjective:    Hgb 7.3, cr stable at 4.35, weight stable at 124lbs .  Torsemide restarted yesterday.  She denies any further bleeding.  Her breathing is stable, she denies any chest pain.     Patient underwent RHC 3/7 on dobutamine 73mg/kg/min  RA = 11 RV = 54/12 PA = 58/19 (35) PCW = 21 (v = 38) Fick cardiac output/index = 7.8/4.8 PVR = 1.8 WU Ao sat = 99% PA sat = 73%, 73%  Assessment:  1. Biventricular HF with mild to moderate volume overload 2. Prominent v-waves in PCWP tracing c/w severe MR or diastolic dysfunction 3. High cardiac output on dobutamine  Objective:   Weight Range: 56.4 kg Body mass index is 22.75 kg/m.   Vital Signs:   Temp:  [97.8 F (36.6 C)-98 F (36.7 C)] 97.9 F (36.6 C) (03/11 0509) Pulse Rate:  [69-70] 70 (03/11 0509) Resp:  [16-20] 16 (03/11 0509) BP: (105-143)/(50-105) 135/51 (03/11 0509) SpO2:  [98 %-100 %] 98 % (03/11 0509) Weight:  [56.4 kg] 56.4 kg (03/11 0509) Last BM Date: 12/06/20  Weight change: Filed Weights   12/05/20 0334 12/06/20 0325 12/07/20 0509  Weight: 56.4 kg 56.8 kg 56.4 kg    Intake/Output:   Intake/Output Summary (Last 24 hours) at 12/07/2020 0805 Last data filed at 12/07/2020 0515 Gross per 24 hour  Intake 886.72 ml  Output 601 ml  Net 285.72 ml      Physical Exam    Cardiac:  Neck veins flat, normal rate and rhythm, clear s1 and s2, 3/6 TR, rubs or gallops, no LE edema, left bka Pulmonary:  bibasilar rales, not in distress Abdominal: soft, no guarding  Psych: Alert, conversant, in good spirits   Telemetry   V-paced 70s Personally reviewed   Labs    CBC Recent Labs    12/06/20 1440 12/07/20 0245  WBC 8.4 8.0  NEUTROABS 5.7  --   HGB 8.0* 7.3*  HCT 23.2* 22.3*  MCV 89.2 91.0  PLT 168 [id number]  Basic Metabolic Panel Recent Labs    12/05/20 0341 12/06/20 0147 12/07/20 0641  NA 134* 136 137  K 3.4* 3.8 4.0  CL 90* 94* 96*  CO2 '30 30 28  '$ GLUCOSE 98 152* 159*  BUN 112* 116* 115*  CREATININE 4.69* 4.54* 4.35*  CALCIUM 8.8* 8.7* 9.0  PHOS 7.2* 6.8*  --    Liver Function Tests Recent Labs    12/05/20 0341 12/06/20 0147  ALBUMIN 2.5* 2.5*   No results for input(s): LIPASE, AMYLASE in the last 72 hours. Cardiac Enzymes No results for input(s): CKTOTAL, CKMB, CKMBINDEX, TROPONINI in the last 72 hours.  BNP: BNP (last 3 results) Recent Labs    06/20/20 0956 11/21/20 1105  BNP >4,500.0* >4,500.0*    ProBNP (last 3 results) No results for input(s): PROBNP in the last 8760 hours.   D-Dimer No results for input(s): DDIMER in the last  72 hours. Hemoglobin A1C No results for input(s): HGBA1C in the last 72 hours. Fasting Lipid Panel No results for input(s): CHOL, HDL, LDLCALC, TRIG, CHOLHDL, LDLDIRECT in the last 72 hours. Thyroid Function Tests No results for input(s): TSH, T4TOTAL, T3FREE, THYROIDAB in the last 72 hours.  Invalid input(s): FREET3  Other results:   Imaging    No results found.   Medications:     Scheduled Medications: . atorvastatin  40 mg Oral Daily  . calcitRIOL  0.5 mcg Oral Daily  . darbepoetin (ARANESP) injection - NON-DIALYSIS  60 mcg Subcutaneous Q Fri-1800  . sevelamer carbonate  1,600 mg Oral TID WC  . sodium chloride flush  3 mL Intravenous Q12H  . sodium chloride flush  3 mL Intravenous Q12H  . torsemide  60 mg Oral BID    Infusions: . sodium chloride      PRN Medications: sodium chloride,  acetaminophen, albuterol, diclofenac Sodium, HYDROmorphone (DILAUDID) injection, HYDROmorphone, hydrOXYzine, loperamide, ondansetron (ZOFRAN) IV, polyethylene glycol, sodium chloride flush    Assessment/Plan    1. A/C Systolic Heart Failure - Etiology unclear possibly from RV pacing?  Has [facility] Jude PPM - Myoview 08/2020  Showed EF 30% with no signs of ischemia. Unable to cath with CKD. No chest pain.  -ECHO this admit with EF 30-35% RV normal. Severe TR - dobutamine stopped 3/10 - volume status and breathing improved with diuresis and dobutamine support but with worsening renal function - remains on maintenance torsemide, nephrology managing - Poor candidate for dialysis  - Off Coreg w/ suspected low output.   - No dig/spir/arni with elevated creatinine.   2.  CKD Stage IV - Creatinine baseline  2.9-3.3 - she has low oncotic pressure low hgb, may be an element of nephrotic syndrome also has low hgb, intrinsic renal diseaseworsened with diuresis despite excellent cardiac output on dobutamine , seems to have stabilized -maintenance diuretic started yesterday torsemide 60 bid - Nephrology following. They also feel she would have a hard time tolerating iHD but given her goals of care are willing to do a trial if necessary  3. PAF - In NSR - eliquis held for GI bleed and low hgb  4. 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   5. PAD -H/O LBKA   6. Hyperthyroidism  - Thyroid US 11/12/20 with numerous small nodules  -11/22/20 TSH  0.014 Free T3 1.3   7. Severe TR -Noted on ECHO   8. Hyperkalemia  -resolved  9. Blood loss Anemia with concomitant anemia of chronic disease - GI blood loss, having melanic stools which according to patient have stopped with holding eliquis but hgb seems to be drifting down still 7.3 today compared with 8 yesterday after she received PRBC's - Ferritin 135, Iron 44, Sats 15% -  Received IV Feraheme x2, nephro giving ESA - eliquis still on hold   10. CAD  - mild CAD on cath 2005  - Myoview 2021 no signs of ischemia - On statin + eliquis but eliquis held with GI bleeding  11. Social  Referred to SW for assistance with financial difficulties.    Length of Stay: [place], MD  12/07/2020, 8:05 AM  Advanced Heart Failure Team Pager [phone] (M-F; [place])  Please contact [facility] for night-coverage after hours (4p -7a ) and weekends on [email or web address]    Patient seen and examined with  the above-signed Energy manager and/or Housestaff. I personally reviewed laboratory data, imaging studies and relevant notes. I independently examined the patient and formulated the important aspects of the plan. I have edited the note to reflect any of my changes or salient points. I have personally discussed the plan with the patient and/or family.  Dobutamine stopped yesterday. Feels ok. Back on oral diuretics. Creatinine slightly improved. Weigh down a pound overnight  General:  Weak appearing. No resp difficulty HEENT: normal Neck: supple. JVP to jaw. Carotids 2+ bilat; no bruits. No lymphadenopathy or thryomegaly appreciated. Cor: PMI nondisplaced. Regular rate & rhythm. 2/6 TR Lungs: clear Abdomen: soft, nontender, nondistended. No hepatosplenomegaly. No bruits or masses. Good bowel sounds. Extremities: no cyanosis, clubbing, rash, trace-1+ edema  L BKA Neuro: alert & orientedx3, cranial nerves grossly intact. moves all 4 extremities w/o difficulty. Affect pleasant  I think this is as good as we can get her. She is not candidate for home inotropes. No ACE/ARB/ARNI with CKD. No b-blocker with low output. Continue torsemide at 60 bid. Would try to keep SBP > 130. If going up can add Bidil gently. Would strongly consider Hospice support on d/c. Will arrange f/u in HF Clinic   Glori Bickers, MD  10:29 AM

## 2020-12-07 NOTE — Plan of Care (Signed)
  Problem: Clinical Measurements: Goal: Will remain free from infection Outcome: Completed/Met   Problem: Clinical Measurements: Goal: Respiratory complications will improve Outcome: Completed/Met   Problem: Clinical Measurements: Goal: Cardiovascular complication will be avoided Outcome: Completed/Met   Problem: Elimination: Goal: Will not experience complications related to urinary retention Outcome: Completed/Met

## 2020-12-07 NOTE — Care Management Important Message (Signed)
Important Message  Patient Details  Name: [name] [name] MRN: [id number] Date of Birth: Mar 22, 1951   Medicare Important Message Given:  Yes     Shelda Altes 12/07/2020, 10:03 AM

## 2020-12-07 NOTE — Progress Notes (Signed)
   Subjective:   No acute overnight events.  Says she is sleepy, but feels good today. No stomach pain today. Had 2 small dark/black-colored stools today.  Objective:  Vital signs in last 24 hours: Vitals:   12/06/20 2000 12/07/20 0000 12/07/20 0509 12/07/20 1119  BP: (!) 127/50 (!) 143/64 (!) 135/51 (!) 126/44  Pulse: 70 69 70 70  Resp: '18  16 16  '$ Temp: 97.8 F (36.6 C)  97.9 F (36.6 C) 98.6 F (37 C)  TempSrc: Oral  Oral Oral  SpO2: 100%  98% 100%  Weight:   56.4 kg     Intake/Output Summary (Last 24 hours) at 12/07/2020 1231 Last data filed at 12/07/2020 1122 Gross per 24 hour  Intake 320 ml  Output 1301 ml  Net -981 ml    Physical Exam: General: critically ill-appearing elderly female, lying in bed, NAD. CV: ventricle-paced rate, regular rhythm. Abdomen: distended, no TTP, abdominal wall edema present but improved. MSK: left BKA, minimal edema bilaterally. Neuro: AAOx4, no focal deficits noted.   Assessment/Plan:  Principal Problem:   Acute exacerbation of CHF (congestive heart failure) (HCC) Active Problems:   Complete heart block (HCC)   CKD stage 4 secondary to hypertension ([place])   Diabetes mellitus (HCC)   Paroxysmal atrial fibrillation (HCC)   AKI (acute kidney injury) ([place])  [name] [name] is a 70 year old female with PMH of CAD w/sinus arrest now s/p pacemaker, HFrEF (EF 30-35%), paroxysmal atrial fibrillation on eliquis, CKD stage IV, and normocytic anemia presenting with increasing SHOB and weight gain, admitted for biventricular heart failure exacerbation complicated by cardiorenal syndrome.  Biventricular Heart Failure CAD, Hx of sinus arrest s/p pacemaker Moderate MR, Severe TR Paroxysmal Atrial Fibrillation Overall, poor prognosis unfortunately. Maintenance diuretics restarted yesterday as per Advanced HF. Coreg will be restarted during outpatient clinic visit with cardiology. Dobutamine was stopped on 3/10. Palliative care following for [place]  discussions. Patient is DNR/DNI. Will need to optimize patient's medications as much as possible as she would like to go back home soon. PT recommending [facility] with intermittent supervision. Best friend, Ihor Austin, established as patient's Healthcare Power of Attorney yesterday. -greatly appreciate assistance from Advanced HF, nephrology, and palliative care -continue torsemide '60mg'$  BID   AKI on CKD Stage IV Cardiorenal syndrome Hyponatremia Hyperphosphatemia Unfortunately, she is a poor candidate for dialysis due to multiple comorbidities as per nephrology. However, patient would like to try dialysis if needed but hopefully this will not be needed during this admission. Kidney function appears to have stabilized with slight improvement in creatinine seen on labs today.  -continue sevelamer '1600mg'$  TID with meals -trend renal function panel, avoid nephrotoxic medications    Acute blood loss anemia vs Anemia of chronic disease from above Patient still having small episodes of melena. S/p 1 unit pRBCs. Started on ESA as per nephrology. Likely would not benefit from extensive GI workup given poor prognosis from multiple comorbidities (see above). -Hgb 8 > 7.3 this AM, will continue to trend CBC and vitals to assess hemodynamic stability   Prior to Admission Living Arrangement: Home Anticipated Discharge Location: TBD Barriers to Discharge: continued medical management Dispo: Anticipated discharge in approximately 2-3 day(s).   Virl Axe, MD 12/07/2020, 12:31 PM Pager: [phone] After 5pm on weekdays and 1pm on weekends: On Call pager [phone]

## 2020-12-08 DIAGNOSIS — I5082 Biventricular heart failure: Secondary | ICD-10-CM | POA: Diagnosis not present

## 2020-12-08 DIAGNOSIS — I5023 Acute on chronic systolic (congestive) heart failure: Secondary | ICD-10-CM | POA: Diagnosis not present

## 2020-12-08 DIAGNOSIS — I130 Hypertensive heart and chronic kidney disease with heart failure and stage 1 through stage 4 chronic kidney disease, or unspecified chronic kidney disease: Secondary | ICD-10-CM | POA: Diagnosis not present

## 2020-12-08 DIAGNOSIS — N184 Chronic kidney disease, stage 4 (severe): Secondary | ICD-10-CM | POA: Diagnosis not present

## 2020-12-08 LAB — RENAL FUNCTION PANEL
Albumin: 2.7 g/dL — ABNORMAL LOW (ref 3.5–5.0)
Anion gap: 13 (ref 5–15)
BUN: 119 mg/dL — ABNORMAL HIGH (ref 8–23)
CO2: 29 mmol/L (ref 22–32)
Calcium: 9 mg/dL (ref 8.9–10.3)
Chloride: 95 mmol/L — ABNORMAL LOW (ref 98–111)
Creatinine, Ser: 4.25 mg/dL — ABNORMAL HIGH (ref 0.44–1.00)
GFR, Estimated: 11 mL/min — ABNORMAL LOW (ref >60)
Glucose, Bld: 117 mg/dL — ABNORMAL HIGH (ref 70–99)
Phosphorus: 7 mg/dL — ABNORMAL HIGH (ref 2.5–4.6)
Potassium: 3.7 mmol/L (ref 3.5–5.1)
Sodium: 137 mmol/L (ref 135–145)

## 2020-12-08 LAB — GLUCOSE, CAPILLARY
Glucose-Capillary: 143 mg/dL — ABNORMAL HIGH (ref 70–99)
Glucose-Capillary: 111 mg/dL — ABNORMAL HIGH (ref 70–99)

## 2020-12-08 LAB — CBC
HCT: 21.9 % — ABNORMAL LOW (ref 36.0–46.0)
Hemoglobin: 7.2 g/dL — ABNORMAL LOW (ref 12.0–15.0)
MCH: 30.4 pg (ref 26.0–34.0)
MCHC: 32.9 g/dL (ref 30.0–36.0)
MCV: 92.4 fL (ref 80.0–100.0)
Platelets: 157 10*3/uL (ref 150–400)
RBC: 2.37 MIL/uL — ABNORMAL LOW (ref 3.87–5.11)
RDW: 17.6 % — ABNORMAL HIGH (ref 11.5–15.5)
WBC: 8.6 10*3/uL (ref 4.0–10.5)
nRBC: 0 % (ref 0.0–0.2)

## 2020-12-08 LAB — MAGNESIUM: Magnesium: 2.3 mg/dL (ref 1.7–2.4)

## 2020-12-08 MED ORDER — CALCITRIOL 0.5 MCG PO CAPS: 0.5000 ug | ORAL_CAPSULE | Freq: Every day | ORAL | 2 refills | Status: DC

## 2020-12-08 MED ORDER — TORSEMIDE 60 MG PO TABS: 60.0000 mg | ORAL_TABLET | Freq: Every day | ORAL | 2 refills | Status: DC

## 2020-12-08 MED ORDER — SEVELAMER CARBONATE 800 MG PO TABS: 1600.0000 mg | ORAL_TABLET | Freq: Three times a day (TID) | ORAL | 2 refills | Status: DC

## 2020-12-08 MED ORDER — TORSEMIDE 20 MG PO TABS
60.0000 mg | ORAL_TABLET | Freq: Every day | ORAL | Status: DC
Start: 2020-12-08 — End: 2020-12-08
  Administered 2020-12-08: 60 mg via ORAL
  Filled 2020-12-08: qty 3

## 2020-12-08 NOTE — TOC Transition Note (Signed)
Transition of Care [facility]) - CM/SW Discharge Note   Patient Details  Name: [name] [name] MRN: [id number] Date of Birth: 1950-10-26  Transition of Care [facility]) CM/SW Contact:  Carles Collet, RN Phone Number: 12/08/2020, 12:08 PM   Clinical Narrative:   [name] w patient by phone. Confirmed that she has transportation home. Patient declined outpatient palliative referral, notified [name] of DC. No DME needs identified.     Final next level of care: National Barriers to Discharge: No Barriers Identified   Patient Goals and CMS Choice Patient states their goals for this hospitalization and ongoing recovery are:: get better CMS Medicare.gov Compare Post Acute Care list provided to:: Patient Choice offered to / list presented to : Patient  Discharge Placement                       Discharge Plan and Services In-house Referral: NA Discharge Planning Services: CM Consult Post Acute Care Choice: Home Health            DME Agency: NA       HH Arranged: PT,OT [place] Agency: [facility]) Date [place]: 12/08/20 Time [place]: 1208 Representative spoke with at [place]: Corene Cornea  Social Determinants of Health ([place]) Interventions Food Insecurity Interventions: Intervention Not Indicated Financial Strain Interventions: Intervention Not Indicated (Pt. reports wanting to leave hospital due to concerns about paying her bills by the 8th of the month. She pays her bills by certified bank check at the bank and expresses concerns about not being able to do that. CSW provided support.) Housing Interventions: Intervention Not Indicated,Other (Comment) (Pt. lives at [facility]) Transportation Interventions: [employer] ([employer]),Other (Comment) (Patient utilizes Medicaid transportation and Access [place]/[employer]AT)   Readmission Risk Interventions Readmission Risk Prevention Plan 11/29/2020  Transportation Screening  Complete  PCP or Specialist Appt within 3-5 Days Complete  HRI or Airport Complete  Social Work Consult for [place] Planning/Counseling Complete  Palliative Care Screening Not Applicable  Medication Review Press photographer) Complete  Some recent data might be hidden

## 2020-12-08 NOTE — Progress Notes (Signed)
Patient ID: [name] [name], female   DOB: 02/26/1951, 70 y.o.   MRN: [id number] S: Patient states she is doing well today.  Denies any complaints.  Wants to go home O:BP (!) 128/57 (BP Location: Right Arm)   Pulse 69   Temp 98.9 F (37.2 C) (Oral)   Resp 19   Wt 55.2 kg   SpO2 100%   BMI 22.24 kg/m   Intake/Output Summary (Last 24 hours) at 12/08/2020 1020 Last data filed at 12/08/2020 0801 Gross per 24 hour  Intake 780 ml  Output 2550 ml  Net -1770 ml   Intake/Output: I/O last 3 completed shifts: In: 33 [P.O.:860] Out: 3651 [Urine:3650; Stool:1]  Intake/Output this shift:  Total I/O In: 240 [P.O.:240] Out: -  Weight change: -1.27 kg Gen: Appears chronically ill, lying in bed, no distress CVS: Normal rate, no peripheral edema Resp: Bilateral chest rise, mild increased work of breathing Abd: Mild distention Ext: Warm and well perfused  Recent Labs  Lab 12/02/20 0308 12/03/20 0423 12/03/20 1403 12/03/20 1404 12/03/20 1408 12/04/20 0402 12/05/20 0341 12/06/20 0147 12/07/20 0641 12/08/20 0307  NA 128* 129*   < > 135 131* 131* 134* 136 137 137  K 4.3 4.2   < > 3.5 3.8 3.8 3.4* 3.8 4.0 3.7  CL 88* 86*  --   --   --  87* 90* 94* 96* 95*  CO2 25 29  --   --   --  '29 30 30 28 29  '$ GLUCOSE 100* 89  --   --   --  95 98 152* 159* 117*  BUN 85* 91*  --   --   --  101* 112* 116* 115* 119*  CREATININE 3.56* 4.25*  --   --   --  4.52* 4.69* 4.54* 4.35* 4.25*  ALBUMIN 2.7* 2.5*  --   --   --  2.6* 2.5* 2.5*  --  2.7*  CALCIUM 9.2 9.6  --   --   --  9.4 8.8* 8.7* 9.0 9.0  PHOS 7.1* 7.5*  --   --   --  8.1* 7.2* 6.8* 6.6* 7.0*   < > = values in this interval not displayed.   Liver Function Tests: Recent Labs  Lab 12/05/20 0341 12/06/20 0147 12/08/20 0307  ALBUMIN 2.5* 2.5* 2.7*   No results for input(s): LIPASE, AMYLASE in the last 168 hours. No results for input(s): AMMONIA in the last 168 hours. CBC: Recent Labs  Lab 12/06/20 1008 12/06/20 1440 12/07/20 0245  12/07/20 1639 12/08/20 0307  WBC 7.4 8.4 8.0 8.7 8.6  NEUTROABS  --  5.7  --   --   --   HGB 7.8* 8.0* 7.3* 7.3* 7.2*  HCT 22.9* 23.2* 22.3* 22.7* 21.9*  MCV 89.5 89.2 91.0 93.8 92.4  PLT 162 168 154 145* 157   Cardiac Enzymes: No results for input(s): CKTOTAL, CKMB, CKMBINDEX, TROPONINI in the last 168 hours. CBG: Recent Labs  Lab 12/07/20 0541 12/07/20 1120 12/07/20 1555 12/07/20 2113 12/08/20 0613  GLUCAP 81 167* 176* 120* 143*    Iron Studies:  No results for input(s): IRON, TIBC, TRANSFERRIN, FERRITIN in the last 72 hours. Studies/Results: No results found. Marland Kitchen atorvastatin  40 mg Oral Daily  . calcitRIOL  0.5 mcg Oral Daily  . darbepoetin (ARANESP) injection - NON-DIALYSIS  60 mcg Subcutaneous Q Fri-1800  . sevelamer carbonate  1,600 mg Oral TID WC  . sodium chloride flush  3 mL Intravenous Q12H  . sodium  chloride flush  3 mL Intravenous Q12H  . torsemide  60 mg Oral Daily    BMET    Component Value Date/Time   NA 137 12/08/2020 0307   NA 142 11/08/2020 1039   K 3.7 12/08/2020 0307   CL 95 (L) 12/08/2020 0307   CO2 29 12/08/2020 0307   GLUCOSE 117 (H) 12/08/2020 0307   BUN 119 (H) 12/08/2020 0307   BUN 21 11/08/2020 1039   CREATININE 4.25 (H) 12/08/2020 0307   CREATININE 2.26 (H) 04/07/2018 1330   CALCIUM 9.0 12/08/2020 0307   CALCIUM 9.3 08/28/2011 1648   GFRNONAA 11 (L) 12/08/2020 0307   GFRNONAA 29 (L) 11/10/2012 1115   GFRAA 28 (L) 11/08/2020 1039   GFRAA 33 (L) 11/10/2012 1115   CBC    Component Value Date/Time   WBC 8.6 12/08/2020 0307   RBC 2.37 (L) 12/08/2020 0307   HGB 7.2 (L) 12/08/2020 0307   HGB 11.7 11/25/2018 0931   HCT 21.9 (L) 12/08/2020 0307   HCT 35.8 11/25/2018 0931   PLT 157 12/08/2020 0307   PLT 146 (L) 11/25/2018 0931   MCV 92.4 12/08/2020 0307   MCV 93 11/25/2018 0931   MCH 30.4 12/08/2020 0307   MCHC 32.9 12/08/2020 0307   RDW 17.6 (H) 12/08/2020 0307   RDW 13.1 11/25/2018 0931   LYMPHSABS 0.9 12/06/2020 1440    LYMPHSABS 2.0 11/25/2018 0931   MONOABS 1.0 12/06/2020 1440   EOSABS 0.6 (H) 12/06/2020 1440   EOSABS 0.7 (H) 11/25/2018 0931   BASOSABS 0.0 12/06/2020 1440   BASOSABS 0.1 11/25/2018 0931    Assessment/Plan: 1. AKI/CKD stage IV in setting of decompensated systolic and diastolic CHF with escalating doses of IV lasix. Now more euvolemic with oral diuretics ordered.  AKI likely cardiorenal syndrome. Creatinine slightly improved/stable 1. Robust response to twice daily torsemide yesterday, decreased to daily 2. Off dobutamine 3. Maintain current volume status 4. I have personally had a long conversation with the patient as well as palliative care.  She is DNR but wants to try dialysis if needed.  She really wants to leave the hospital and we discussed that she cannot do this until her kidney function is improving off of dobutamine 5. No indications for dialysis at this time.  She is stable for discharge from our standpoint.  I will arrange follow-up for her in our office 2. Acute on chronic systolic and diastolic CHF- profound volume overload initially. EF 30% with severe TR.now significantly improved volume status.  Heart failure managing.  Off dobutamine 3. Complete heart block - s/p PPM, high percentage of RV pacing per Cardiology 4. P. Afib -normal rates at this time 5. Severe protein malnutrition -encourage p.o. intake 6. Moderate MR and severe TR- as above recommend heart failure team consultation 7. Hyponatremia - due to volume overload.Resolved 8. Hyperphosphatemia: Continue sevelamer 3 times daily 9. Anemia of CKD stage IV- cont to follow and transfuse prn.She has been given Feraheme and is on ESA; last received 60 MCG on 3/11.  Status post transfusion on 3/10 10. Disposition - poor overall prognosis as above.  Palliative care assisting.  She is stable to discharge at this standpoint.  I will sign off.  If further help is needed please do not hesitate to contact us.

## 2020-12-08 NOTE — Progress Notes (Signed)
Subjective:  Patient reports she is doing well this morning and continues to want to discharge home.  She had a normal colored bowel movement overnight. Denies any red or black bowel movements overnight.  We reviewed her understanding of her heart and kidney disease.    Objective:  Vital signs in last 24 hours: Vitals:   12/07/20 0509 12/07/20 1119 12/07/20 1948 12/08/20 0131  BP: (!) 135/51 (!) 126/44 (!) 113/92   Pulse: 70 70 70   Resp: '16 16 18   '$ Temp: 97.9 F (36.6 C) 98.6 F (37 C) 98.6 F (37 C)   TempSrc: Oral Oral Oral   SpO2: 98% 100% 100%   Weight: 56.4 kg   55.2 kg    Intake/Output Summary (Last 24 hours) at 12/08/2020 0534 Last data filed at 12/08/2020 0131 Gross per 24 hour  Intake 540 ml  Output 3050 ml  Net -2510 ml    General: Chronically ill-appearing woman, lying in bed in no acute distress CV: Regular rate and rhythm, 3/6 holosystolic murmur left lower sternal border.  JVD Abdomen: Normal bowel sounds, soft, nontender Extremity: Left BKA, no lower extremity edema   Assessment/Plan:  Principal Problem:   Acute exacerbation of CHF (congestive heart failure) (HCC) Active Problems:   Complete heart block (HCC)   CKD stage 4 secondary to hypertension (HCC)   Diabetes mellitus (HCC)   Paroxysmal atrial fibrillation (HCC)   AKI (acute kidney injury) ([place])   This is hospital day #16 for [name] [name] a 70 year old person with PMH of CAD with sinus arrest now s/p pacemaker, HFrEF ( Echo 2/24 EF 30-35%, RV nl, Severe TR,mild MR), and CKD stage IV who was admitted for acute on chronic heart failure with AKI secondary to cardiorenal syndrome.  Biventricular Heart Failure CAD, Hx of sinus arrest s/p pacemaker Moderate MR, Severe TR Paroxysmal Atrial Fibrillation -Appreciate HF Cards following and recommendations. Patient has [facility] Jude PPM , heart failure possibly secondary to LV pacing. Patient is now off of dobutamine since 3/10.   -Patient is net  -13 L on admission, weight 55.2 kg today.  Goal is to maintain this weight, now on torsemide 60 mg daily.  Patient is not a candidate for ACE/ARB/Arni with CKD. She has low output heart failure not a candidate for beta-blocker.  AKI on CKD Stage IV secondary to cardiorenal syndrome Hyperphosphatemia -Baseline creatinine 2.4-2.98 over the last 2 years. (Creatinine 4.25, BUN 119, Phos 7, corrected calcium 10, K 3.7) Patient is now off dobutamine.  Patient had good urine output yesterday, 2.8 L. Torsemide dose changed to 60 mg once daily.  Nephrology will arrange close follow up in there clinic.   -continue sevelamer '1600mg'$  TID with meals -trend renal function panel, avoid nephrotoxic medications   Hx of Paroxysmal Atrial Fibrillation Acute blood loss anemia vs Anemia of chronic disease from above Patient has had multiple small episodes of melena. S/p 1 unit pRBCs on 3/10.  S/p Feraheme and ESA as per nephrology.  Eliquis has been held and we will not resume at this time.  - Hemoglobin stable today, hemoglobin 7.2  Goals of care: Overall patient has a poor prognosis given biventricular heart failure and CKD stage IV.  Palliative team has been consulted and greatly appreciate their assistance with goals of care discussions.  Patient is DNR/DNI.  Her best friend Ihor Austin was established as healthcare power of attorney.   - Referral to outpatient palliative care. Patient has a nursing aid which will be  coming to help her daily and on weekends. In addition she will have [facility] and OT.    Prior to Admission Living Arrangement: Home Anticipated Discharge Location: Home with [facility] PT/OT and palliative care Barriers to Discharge: continued medical management Dispo: Anticipated discharge today.  Madalyn Rob, MD 12/08/2020, 5:34 AM Pager: [phone] After 5pm on weekdays and 1pm on weekends: On Call pager [phone]

## 2020-12-10 ENCOUNTER — Telehealth: Payer: Self-pay

## 2020-12-10 ENCOUNTER — Encounter: Payer: Medicare Other | Admitting: Internal Medicine

## 2020-12-10 NOTE — Telephone Encounter (Incomplete)
Transition Care Management Follow-up Telephone Call  Date of discharge and from where: 12/08/2020 from [facility]  How have you been since you were released from the hospital? Pt stated that she was feeling okay. Pt stated that she has some diarrhea, which was also happening during the hospital stay.   Any questions or concerns? {YES/NO TOC TRACKING USE FOR MANAGED MEDICAID REPORTING:24185}  Items Reviewed:  Did the pt receive and understand the discharge instructions provided? {YES/NO:21197}  Medications obtained and verified? {YES/NO:21197}  Other? {YES/NO:21197}  Any new allergies since your discharge? {YES/NO:21197}  Dietary orders reviewed? {YES/NO TITLE CASE:22902}  Do you have support at home? {YES/NO:21197}  Home Care and Equipment/Supplies: Were home health services ordered? {Response; yes/no/na:63} If so, what is the name of the agency? ***  Has the agency set up a time to come to the patient's home? {Response; yes/no/na:63} Were any new equipment or medical supplies ordered?  [id number] What is the name of the medical supply agency? *** Were you able to get the supplies/equipment? {Response; yes/no/na:63} Do you have any questions related to the use of the equipment or supplies? [id number]  Functional Questionnaire: (I = Independent and D = Dependent) ADLs: ***  Bathing/Dressing- ***  Meal Prep- ***  Eating- ***  Maintaining continence- ***  Transferring/Ambulation- ***  Managing Meds- ***  Follow up appointments reviewed:   PCP Hospital f/u appt confirmed? {YES/NO:21197} Scheduled to see *** on *** @ ***.  [facility] f/u appt confirmed? {YES/NO:21197} Scheduled to see *** on *** @ ***.  Are transportation arrangements needed? {YES/NO:21197}  If their condition worsens, is the pt aware to call PCP or go to the Emergency Dept.? {YES/NO TITLE CASE:22902}  Was the patient provided with contact information for the PCP's office  or ED? {YES/NO TITLE CASE:22902}  Was to pt encouraged to call back with questions or concerns? {YES/NO TITLE CASE:22902}

## 2020-12-10 NOTE — Telephone Encounter (Signed)
Call from [facility] to inform office of  delay of care in starting physical therapy.   Pt had declined weekend start and asked PT for Monday appt ( for today),  Pt is now requesting an appt for Wed [facility] requesting verbal authorization to extend PT with a new start date of Wed 03/16 Verbal authorization given.Regenia Skeeter, Anila Bojarski Cassady3/14/20229:20 AM

## 2020-12-10 NOTE — Telephone Encounter (Signed)
Transition Care Management Follow-up Telephone Call  Date of discharge and from where: 12/08/2020 from [facility]  How have you been since you were released from the hospital? Pt stated that she was feeling okay. Pt stated that she has some diarrhea, which was also happening during the hospital stay. Pt was surprised when I spoke to her about the medications that were discontinued. I am not very sure if the patient has actually started the new medications or stopped the old medications and pt seemed a little confused about what medications to take.   Any questions or concerns? No  Items Reviewed:  Did the pt receive and understand the discharge instructions provided? Yes   Medications obtained and verified? Yes   Other? No   Any new allergies since your discharge? No   Dietary orders reviewed? DM and Heart Healthy/ Low Sodium  Do you have support at home? No   Home Care and Equipment/Supplies: Were home health services ordered? Yes If so, what is the name of the agency? [facility]  Has the agency set up a time to come to the patient's home? Scheduled for Wednesday per pt request.  Were any new equipment or medical supplies ordered?  No What is the name of the medical supply agency? n/a Were you able to get the supplies/equipment? n/a Do you have any questions related to the use of the equipment or supplies? No  Functional Questionnaire: (I = Independent and D = Dependent) ADLs: I  Bathing/Dressing- I  Meal Prep- D - pt has an aid that cooks for her.   Eating- I  Maintaining continence- I  Transferring/Ambulation- D - patient uses a wheelchair  Managing Meds- I   Follow up appointments reviewed:   PCP Hospital f/u appt confirmed? Yes  Scheduled to see Loura Pardon, DO on 12/19/2020 @ 09:45am.  [facility] f/u appt confirmed? Yes  Scheduled to see Marilynne Halsted, MD on 01/17/2021 @ 10:20am. Appointment with the Kidney Specialist has not been made  yet, per pt.   Are transportation arrangements needed? No   If their condition worsens, is the pt aware to call PCP or go to the Emergency Dept.? Yes  Was the patient provided with contact information for the PCP's office or ED? Yes  Was to pt encouraged to call back with questions or concerns? Yes    Call was started on Monday 12/10/2020. During the call I mentioned to patient that she has an appointment with [facility] in 10 minutes. Call was suspended until Tuesday 12/11/2020

## 2020-12-10 NOTE — Telephone Encounter (Signed)
Requesting to speak with a nurse about med for stool. Please call pt back.

## 2020-12-11 ENCOUNTER — Other Ambulatory Visit: Payer: Self-pay | Admitting: Internal Medicine

## 2020-12-11 DIAGNOSIS — I25118 Atherosclerotic heart disease of native coronary artery with other forms of angina pectoris: Secondary | ICD-10-CM

## 2020-12-11 DIAGNOSIS — K529 Noninfective gastroenteritis and colitis, unspecified: Secondary | ICD-10-CM

## 2020-12-11 DIAGNOSIS — I739 Peripheral vascular disease, unspecified: Secondary | ICD-10-CM

## 2020-12-11 NOTE — Telephone Encounter (Signed)
Returned call to patient, patient states she's not sure what she called about.  RN informed patient that RN had a message that patient had called yesterday afternoon requesting a nurse call her back regarding medicine for stool.  Patient states "I'm not sure what I was calling about, I'm not sure if someone called me back and I will call back if I need something". SChaplin, RN,BSN

## 2020-12-11 NOTE — Telephone Encounter (Signed)
Next appt scheduled 12/19/20 with Dr Sharon Seller.

## 2020-12-11 NOTE — Telephone Encounter (Signed)
Need refills on diarrhea medicine ; pt contact [place], [place]

## 2020-12-12 ENCOUNTER — Telehealth: Payer: Self-pay

## 2020-12-12 ENCOUNTER — Telehealth: Payer: Self-pay | Admitting: *Deleted

## 2020-12-12 NOTE — Telephone Encounter (Signed)
I agree

## 2020-12-12 NOTE — Telephone Encounter (Signed)
Received TC from Andee Lineman, PT with [facility] start of care date today.  Wants orders for PT: 2X/week for 4 weeks 1X/week for 4 weeks To work on strength and conditioning. Verbal order given. Will route to red team for agreement or denial. Thanks, SChaplin, RN,BSN

## 2020-12-12 NOTE — Telephone Encounter (Signed)
Agree with VO for HHOT to work on ADLs, upper body strength, transfers

## 2020-12-12 NOTE — Telephone Encounter (Signed)
OT with [facility] called in requesting VO for [facility] OT to work on ADLs, upper body strength, and transfers. Verbal auth given. Will route to Red Team for agreement/denial.

## 2020-12-18 ENCOUNTER — Ambulatory Visit: Payer: Medicare Other | Admitting: *Deleted

## 2020-12-18 ENCOUNTER — Other Ambulatory Visit: Payer: Self-pay | Admitting: *Deleted

## 2020-12-18 DIAGNOSIS — N184 Chronic kidney disease, stage 4 (severe): Secondary | ICD-10-CM

## 2020-12-18 DIAGNOSIS — I129 Hypertensive chronic kidney disease with stage 1 through stage 4 chronic kidney disease, or unspecified chronic kidney disease: Secondary | ICD-10-CM

## 2020-12-18 DIAGNOSIS — I10 Essential (primary) hypertension: Secondary | ICD-10-CM

## 2020-12-18 DIAGNOSIS — I5020 Unspecified systolic (congestive) heart failure: Secondary | ICD-10-CM

## 2020-12-18 DIAGNOSIS — E1122 Type 2 diabetes mellitus with diabetic chronic kidney disease: Secondary | ICD-10-CM

## 2020-12-18 DIAGNOSIS — I480 Paroxysmal atrial fibrillation: Secondary | ICD-10-CM

## 2020-12-18 NOTE — Patient Instructions (Signed)
Visit Information It was nice speaking with you today. Please bring all of your medications with you to your clinic appointment tomorrow. Patient Care Plan: CCM RN - Improve Medication Taking Behavior    Problem Identified: Medication Adherence (Wellness)     Goal: Medication Adherence Improved   Start Date: 09/19/2020  Expected End Date: 03/28/2021  Recent Progress: Not on track  Priority: High  Note:   Current Barriers:  . Non-adherence to prescribed medication regimen . Does not adhere to prescribed medication regimen- spoke with patient via phone, she was hospitalized from 2/23-3/12/22 for acute on chronic heart failure and cardiorenal syndrome and acute on chronic anemia, she was discharge back to her apartment with home health services of PT/OT, when asked, patient states the reason she was hospitalized was "a problem with my kidneys" , she says she was told she may need dialysis but she has not decided if she would agree to the treatment, she states her arms and right leg "look normal" when asked about extremity swelling , during medication reconciliation she said she was still taking the Xarelto, when this RN CCM voiced grave concern that she is unable to take her medications as prescribed and therefore unable to effectively manage her chronic disease states and suggested she meet with the clinic pharmacist she again refused, she also says she wants to remain in her apartment and does not feel she needs assisted living, she is aware she has a clinic appointment tomorrow and has arranged transportation, patient states at the request of this CCM RN, she will bring her list of medications with her to her clinic appointment tomorrow, she was in agreement to list her [place], as a contact on her chart, patient verified she is still receiving home health services of PT and OT from [place]  Nurse Case Manager Clinical Goal(s):  Marland Kitchen Over the next 30-60 days, patient will work with  home health RN and health care team to improve medication taking behavior  Interventions:  . 1:1 collaboration with [name], MD regarding development and update of comprehensive plan of care as evidenced by provider attestation and co-signature . Inter-disciplinary care team collaboration (see longitudinal plan of care) . Reminded patient to bring medications and/or list of current medications she is taking with her to clinic appointment on 12/19/20 . Messaged clinic provider for clinic appointment on 3/23 that patient is not taking her medications as prescribed and that she was told by this CCM RN to stop the Xarelto; suggested provide write order for [facility] to assist with medication taking behavior Patient Goals/Self-Care Activities Over the next 30-60 days, patient will:  - Patient will self administer medications as prescribed Patient will attend all scheduled provider appointments Patient will call pharmacy for medication refills Meet with pharmacist to address medication taking barriers  Follow Up Plan: The care management team will reach out to the patient again over the next 30-60 days.        Task: Optimize Medication Use   Due Date: 03/28/2021  Priority: ASAP  Outcome: Negative  Note:   Care Management Activities:    - barriers to medication adherence identified - messaged provider for order for [facility] to assist patient with medication taking behavior - medication-adherence assessment completed    Notes:        The patient verbalized understanding of instructions, educational materials, and care plan provided today and declined offer to receive copy of patient instructions, educational materials, and care plan.  The care management team will reach out to the patient again over the next 30-60 days.   [name] RN, CCM, [facility] RN Care Manager [phone]

## 2020-12-18 NOTE — Chronic Care Management (AMB) (Signed)
Care Management    RN Visit Note  12/18/2020 Name: [name] [name] MRN: [id number] DOB: April 02, 1951  Subjective: [name] [name] is a 70 y.o. year old female who is a primary care patient of [name], MD. The care management team was consulted for assistance with disease management and care coordination needs.    Engaged with patient by telephone for follow up visit in response to provider referral for case management and/or care coordination services.   Consent to Services:   [name] was given information about Care Management services today including:  1. Care Management services includes personalized support from designated clinical staff supervised by her physician, including individualized plan of care and coordination with other care providers 2. 24/7 contact phone numbers for assistance for urgent and routine care needs. 3. The patient may stop case management services at any time by phone call to the office staff.  Patient agreed to services and consent obtained.   Assessment: Review of patient past medical history, allergies, medications, health status, including review of consultants reports, laboratory and other test data, was performed as part of comprehensive evaluation and provision of chronic care management services.   SDOH (Social Determinants of Health) assessments and interventions performed:    Care Plan  Allergies  Allergen Reactions  . Penicillins Itching    Did it involve swelling of the face/tongue/throat, SOB, or low BP? No Did it involve sudden or severe rash/hives, skin peeling, or any reaction on the inside of your mouth or nose? No Did you need to seek medical attention at a hospital or doctor's office? No When did it last happen?10 years If all above answers are "NO", may proceed with cephalosporin use.     Outpatient Encounter Medications as of 12/18/2020  Medication Sig  . albuterol (PROAIR HFA) 108 (90 Base) MCG/ACT inhaler  Inhale 2 puffs into the lungs every 6 (six) hours as needed for wheezing or shortness of breath (cough).  Marland Kitchen atorvastatin (LIPITOR) 40 MG tablet Take 1 tablet (40 mg total) by mouth daily.  . calcitRIOL (ROCALTROL) 0.5 MCG capsule Take 1 capsule (0.5 mcg total) by mouth daily.  . diclofenac Sodium (VOLTAREN) 1 % GEL Apply 2 g topically 4 (four) times daily.  . hydrocerin (EUCERIN) CREA Apply 1 application topically 2 (two) times daily.  . hydrOXYzine (ATARAX/VISTARIL) 10 MG tablet Take 1 tablet (10 mg total) by mouth 2 (two) times daily as needed for itching.  Marland Kitchen JARDIANCE 10 MG TABS tablet Take 10 mg by mouth daily.  Marland Kitchen loperamide (IMODIUM) 2 MG capsule Take 1 tablet (2 mg total) by mouth 4 (four) times daily as needed for diarrhea or loose stools.  . pantoprazole (PROTONIX) 40 MG tablet Take 1 tablet (40 mg total) by mouth daily.  . sevelamer carbonate (RENVELA) 800 MG tablet Take 2 tablets (1,600 mg total) by mouth 3 (three) times daily with meals.  . torsemide (DEMADEX) 20 MG tablet Take 60 mg by mouth daily.  Marland Kitchen torsemide 60 MG TABS Take 60 mg by mouth daily.  . Travoprost, BAK Free, (TRAVATAN) 0.004 % SOLN ophthalmic solution Place 1 drop into both eyes at bedtime. (Patient taking differently: Place 1 drop into both eyes at bedtime.)   No facility-administered encounter medications on file as of 12/18/2020.    Patient Active Problem List   Diagnosis Date Noted  . AKI (acute kidney injury) ([place])   . Hypervolemia 11/21/2020  . Hyperthyroidism 11/14/2020  . Acute cholecystitis 11/11/2020  . Callus 11/07/2020  .  Heart failure with reduced ejection fraction ([place]) 10/05/2020  . Paroxysmal atrial fibrillation ([place]) 08/30/2020  . Secondary hypercoagulable state ([place]) 08/30/2020  . Diabetes mellitus ([place]) 07/13/2020  . Acute exacerbation of CHF (congestive heart failure) ([place]) 06/20/2020  . Pruritus 06/20/2020  . Status post bilateral below knee amputation ([place]) 04/20/2019  . Chronic cough  08/03/2018  . Status post reverse total shoulder replacement, left   . Neck pain, chronic 10/20/2017  . Cognitive impairment 08/28/2016  . Cardiomyopathy ([place])   . Vitamin D deficiency 06/01/2014  . 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[place]) 03/11/2013  . Preventative health care 02/09/2013  . Below knee amputation status, left 12/20/2012  . CKD stage 4 secondary to hypertension ([place]) 11/04/2012  . Normocytic anemia 11/04/2012  . 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 Chronic diarrhea 08/25/2012  . Complete heart block ([place])   . PACEMAKER-[place] 03/15/2010  . HYPERPARATHYROIDISM, SECONDARY 01/16/2010  . Hyperlipidemia 11/23/2007  . DIABETIC  RETINOPATHY 08/12/2006  . DIABETIC PERIPHERAL NEUROPATHY 08/12/2006  . Essential hypertension 08/12/2006  . PERIPHERAL VASCULAR DISEASE 08/12/2006    Conditions to be addressed/monitored: NIDDM,  HTN, HLD, CAD, HF, CKD stage 4, PAF, CHB with pacemaker, L BKA, hyperthyroidism  Care Plan : CCM RN - Improve Medication Taking Behavior  Updates made by [name], RN since 12/18/2020 12:00 AM    Problem: Medication Adherence (Wellness)     Goal: Medication Adherence Improved   Start Date: 09/19/2020  Expected End Date: 03/28/2021  Recent Progress: Not on track  Priority: High  Note:   Current Barriers:  . Non-adherence to prescribed medication regimen . Does not adhere to prescribed medication regimen- spoke with patient via phone, she was hospitalized from 2/23-3/12/22 for acute on chronic heart failure and cardiorenal syndrome and acute on chronic anemia, she was discharge back to her apartment with home health services of PT/OT, when asked, patient states the reason she was hospitalized was "a problem with my kidneys" , she says she was told she may need dialysis but she has not decided if she would agree to the treatment, she states her arms and right leg "look normal" when asked about extremity swelling ,  during medication reconciliation she said she was still taking the Xarelto, when this RN CCM voiced grave concern that she is unable to take her medications as prescribed and therefore unable to effectively manage her chronic disease states and suggested she meet with the clinic pharmacist she again refused, she also says she wants to remain in her apartment and does not feel she needs assisted living, she is aware she has a clinic appointment tomorrow and has arranged transportation, patient states at the request of this CCM RN, she will bring her list of medications with her to her clinic appointment tomorrow, she was in agreement to list her [place], as a contact on her chart, patient verified she is still receiving home health services of PT and OT from [place]  Nurse Case Manager Clinical Goal(s):  Marland Kitchen Over the next 30-60 days, patient will work with home health RN and health care team to improve medication taking behavior  Interventions:   1:1 collaboration with [name], MD regarding development and update of comprehensive plan of care as evidenced by provider attestation and co-signature  Inter-disciplinary care team collaboration (see longitudinal plan of care)  Reminded patient to bring medications and/or list of current medications she is taking with her to clinic appointment on  12/19/20  Messaged clinic provider for clinic appointment on 3/23 that patient is not taking her medications as prescribed and that she was told by this CCM RN to stop the Xarelto; suggested provide write order for [facility] to assist with medication taking behavior Patient Goals/Self-Care Activities Over the next 30-60 days, patient will:  - Patient will self administer medications as prescribed Patient will attend all scheduled provider appointments Patient will call pharmacy for medication refills Meet with pharmacist to address medication taking barriers  Follow Up Plan: The care  management team will reach out to the patient again over the next 30-60 days.        Task: Optimize Medication Use   Due Date: 03/28/2021  Priority: ASAP  Outcome: Negative  Note:   Care Management Activities:    - barriers to medication adherence identified - messaged provider for order for [facility] to assist patient with medication taking behavior - medication-adherence assessment completed    Notes:        Plan: The care management team will reach out to the patient again over the next 30-60 days.  [name] RN, CCM, [facility] RN Care Manager [phone]

## 2020-12-19 ENCOUNTER — Encounter: Payer: Self-pay | Admitting: Internal Medicine

## 2020-12-19 ENCOUNTER — Other Ambulatory Visit: Payer: Self-pay

## 2020-12-19 ENCOUNTER — Ambulatory Visit (INDEPENDENT_AMBULATORY_CARE_PROVIDER_SITE_OTHER): Payer: Medicare Other | Admitting: Internal Medicine

## 2020-12-19 VITALS — BP 143/73 | HR 69 | Temp 98.1°F | Ht 62.0 in | Wt 117.0 lb

## 2020-12-19 DIAGNOSIS — N184 Chronic kidney disease, stage 4 (severe): Secondary | ICD-10-CM

## 2020-12-19 DIAGNOSIS — I129 Hypertensive chronic kidney disease with stage 1 through stage 4 chronic kidney disease, or unspecified chronic kidney disease: Secondary | ICD-10-CM | POA: Diagnosis not present

## 2020-12-19 DIAGNOSIS — I480 Paroxysmal atrial fibrillation: Secondary | ICD-10-CM

## 2020-12-19 DIAGNOSIS — I5020 Unspecified systolic (congestive) heart failure: Secondary | ICD-10-CM | POA: Diagnosis not present

## 2020-12-19 DIAGNOSIS — Z789 Other specified health status: Secondary | ICD-10-CM | POA: Diagnosis not present

## 2020-12-19 DIAGNOSIS — I25118 Atherosclerotic heart disease of native coronary artery with other forms of angina pectoris: Secondary | ICD-10-CM | POA: Diagnosis not present

## 2020-12-19 DIAGNOSIS — D631 Anemia in chronic kidney disease: Secondary | ICD-10-CM

## 2020-12-19 DIAGNOSIS — D649 Anemia, unspecified: Secondary | ICD-10-CM

## 2020-12-19 NOTE — Assessment & Plan Note (Addendum)
During admission had BRBPR and hematochezia thought to be secondary to hemorrhoids and possible slow GI bleed and received transfusion 2U. She has not had any recurrence of bleeding or dark stools since discharge. No weakness or dizziness.   - Discussed importance of restarting xarelto if CBC is stable and her risk of stroke from atrial fibrillation. She is hesitant but we will follow-up CBC and discuss.   ADDENDUM: hemoglobin stable. Will restarted xaretlo. Discussed risks and benefits and she is agreeable.

## 2020-12-19 NOTE — Assessment & Plan Note (Signed)
She has been doing well since hospital discharge. Recent admission for HF exacerbation, found to have EF of 30-35% (prior 20-25%), GIID dysfunction, with RV systolic pressure 99991111 mmHg and several left atrial dilation, moderate right atrial dilation. Today no shortness of breath, lower extremity swelling or swelling in her abdomen. She has been taking her torsemide although is not quite sure of the dosing. She forgot to bring her medications today. She has follow-up with cardiology and believes she has f/u with nephrology. Recommended she call the clinic if she does not have an appointment or is not sure when and we can provide their information.    - check BMP - cont. Torsemide 60 mg  - cont. jardiance  - requesting med rec with pharmacy, she is agreeable to this.

## 2020-12-19 NOTE — Assessment & Plan Note (Signed)
With recent hospital discharge there has been some concern for patient being able to manage her medications and complete ADLs at home. She is following with social work and was initially thought to have continued xarelto when instructed to d/c at discharge. Social work requested she bring her medications with her, which she forgot.  She states today she has not been taking the xarelto but is unsure what medications she is taking. She does not want to use a pill box and states she is following the meds as directed on the bottle. She does appear euvolemic today but is at high risk. She has a Chartered certified accountant that comes by 5 days a week to check on her. She states she also has been having a nurse come by since hospital discharge.   - will have pharmacy perform med rec, she is agreeable.

## 2020-12-19 NOTE — Patient Instructions (Addendum)
Thank you for allowing [place] to provide your care today. Today we discussed your heart failure and recent hemorrhoidal bleeding.     I have ordered the following labs for you:  CBC and basic metabolic panel    I will call with results, and we can discuss restarting your Xarelto.   Today we made the following changes to your medications:   I have also placed a referral to the pharmacist to go over your medications.   Please follow-up in three months.    Please call the internal medicine center clinic if you have any questions or concerns, we [facility] be able to help and keep you from a long and expensive emergency room wait. Our clinic and after hours phone number is [phone], the best time to call is Monday through Friday 9 am to 4 pm but there is always someone available 24/7 if you have an emergency. If you need medication refills please notify your pharmacy one week in advance and they will send [place] a request.
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
## 2020-12-20 ENCOUNTER — Ambulatory Visit (INDEPENDENT_AMBULATORY_CARE_PROVIDER_SITE_OTHER): Payer: Medicare Other

## 2020-12-20 ENCOUNTER — Telehealth: Payer: Self-pay

## 2020-12-20 DIAGNOSIS — I429 Cardiomyopathy, unspecified: Secondary | ICD-10-CM

## 2020-12-20 LAB — CBC
Hematocrit: 29.6 % — ABNORMAL LOW (ref 34.0–46.6)
Hemoglobin: 9.7 g/dL — ABNORMAL LOW (ref 11.1–15.9)
MCH: 31.3 pg (ref 26.6–33.0)
MCHC: 32.8 g/dL (ref 31.5–35.7)
MCV: 96 fL (ref 79–97)
Platelets: 190 10*3/uL (ref 150–450)
RBC: 3.1 x10E6/uL — ABNORMAL LOW (ref 3.77–5.28)
RDW: 14.5 % (ref 11.7–15.4)
WBC: 6.7 10*3/uL (ref 3.4–10.8)

## 2020-12-20 LAB — CUP PACEART REMOTE DEVICE CHECK
Battery Remaining Longevity: 105 mo
Battery Remaining Percentage: 95.5 %
Battery Voltage: 2.99 V
Brady Statistic AP VP Percent: 0 %
Brady Statistic AP VS Percent: 0 %
Brady Statistic AS VP Percent: 0 %
Brady Statistic AS VS Percent: 0 %
Brady Statistic RA Percent Paced: 0 %
Brady Statistic RV Percent Paced: 99 %
Date Time Interrogation Session: 20220323174057
Implantable Lead Implant Date: 20110225
Implantable Lead Implant Date: 20110225
Implantable Lead Location: 753859
Implantable Lead Location: 753860
Implantable Pulse Generator Implant Date: 20200306
Lead Channel Impedance Value: 340 Ohm
Lead Channel Impedance Value: 360 Ohm
Lead Channel Pacing Threshold Amplitude: 0.75 V
Lead Channel Pacing Threshold Pulse Width: 0.4 ms
Lead Channel Sensing Intrinsic Amplitude: 1.4 mV
Lead Channel Sensing Intrinsic Amplitude: 10.5 mV
Lead Channel Setting Pacing Amplitude: 2.5 V
Lead Channel Setting Pacing Amplitude: 2.5 V
Lead Channel Setting Pacing Pulse Width: 0.4 ms
Lead Channel Setting Sensing Sensitivity: 4 mV
Pulse Gen Model: 2272
Pulse Gen Serial Number: 9114794

## 2020-12-20 LAB — BMP8+ANION GAP
Anion Gap: 21 mmol/L — ABNORMAL HIGH (ref 10.0–18.0)
BUN/Creatinine Ratio: 16 (ref 12–28)
BUN: 47 mg/dL — ABNORMAL HIGH (ref 8–27)
CO2: 15 mmol/L — ABNORMAL LOW (ref 20–29)
Calcium: 9.3 mg/dL (ref 8.7–10.3)
Chloride: 113 mmol/L — ABNORMAL HIGH (ref 96–106)
Creatinine, Ser: 2.99 mg/dL — ABNORMAL HIGH (ref 0.57–1.00)
Glucose: 82 mg/dL (ref 65–99)
Potassium: 4.5 mmol/L (ref 3.5–5.2)
Sodium: 149 mmol/L — ABNORMAL HIGH (ref 134–144)
eGFR: 16 mL/min/{1.73_m2} — ABNORMAL LOW (ref >59)

## 2020-12-20 NOTE — Telephone Encounter (Signed)
Carelink alert received for persistent AF since 11/09/20. This is a know issue and MD aware. Unfortunately patient missed last 2 scheduled appointments with device clinic/Dr. Lovena Le. Patient hospitalized during both scheduled appointments. Assessed today via telephone for s/s persistent AF and HF. [place] was discontinued in hospital for Acute GI bleed. Patient denies increased SOB, edema, or chest pain/palpitations. VP '@70'$ . Stressed importance of follow up with Dr. Lovena Le for [facility] upgrade as discussed during [facility] 09/11/21. Patient agrees. Will send to scheduling.

## 2020-12-21 ENCOUNTER — Ambulatory Visit: Payer: Medicare Other | Admitting: *Deleted

## 2020-12-21 DIAGNOSIS — N184 Chronic kidney disease, stage 4 (severe): Secondary | ICD-10-CM

## 2020-12-21 DIAGNOSIS — E1122 Type 2 diabetes mellitus with diabetic chronic kidney disease: Secondary | ICD-10-CM

## 2020-12-21 DIAGNOSIS — I480 Paroxysmal atrial fibrillation: Secondary | ICD-10-CM

## 2020-12-21 DIAGNOSIS — I10 Essential (primary) hypertension: Secondary | ICD-10-CM

## 2020-12-21 DIAGNOSIS — I129 Hypertensive chronic kidney disease with stage 1 through stage 4 chronic kidney disease, or unspecified chronic kidney disease: Secondary | ICD-10-CM

## 2020-12-21 NOTE — Chronic Care Management (AMB) (Signed)
Care Management    RN Visit Note  12/21/2020 Name: [name] [name] MRN: [id number] DOB: 1950-12-22  Subjective: [name] [name] is a 70 y.o. year old female who is a primary care patient of [name] Payment, MD. The care management team was consulted for assistance with disease management and care coordination needs.    Consent to Services:   [name] [name] was given information about Care Management services today including:  1. Care Management services includes personalized support from designated clinical staff supervised by her physician, including individualized plan of care and coordination with other care providers 2. 24/7 contact phone numbers for assistance for urgent and routine care needs. 3. The patient may stop case management services at any time by phone call to the office staff.  Patient agreed to services and consent obtained.   Assessment: Review of patient past medical history, allergies, medications, health status, including review of consultants reports, laboratory and other test data, was performed as part of comprehensive evaluation and provision of chronic care management services.   SDOH (Social Determinants of Health) assessments and interventions performed:    Care Plan  Allergies  Allergen Reactions  . Penicillins Itching    Did it involve swelling of the face/tongue/throat, SOB, or low BP? No Did it involve sudden or severe rash/hives, skin peeling, or any reaction on the inside of your mouth or nose? No Did you need to seek medical attention at a hospital or doctor's office? No When did it last happen?10 years If all above answers are "NO", may proceed with cephalosporin use.     Outpatient Encounter Medications as of 12/21/2020  Medication Sig  . albuterol (PROAIR HFA) 108 (90 Base) MCG/ACT inhaler Inhale 2 puffs into the lungs every 6 (six) hours as needed for wheezing or shortness of breath (cough).  Marland Kitchen atorvastatin (LIPITOR) 40 MG tablet  Take 1 tablet (40 mg total) by mouth daily.  . calcitRIOL (ROCALTROL) 0.5 MCG capsule Take 1 capsule (0.5 mcg total) by mouth daily.  . diclofenac Sodium (VOLTAREN) 1 % GEL Apply 2 g topically 4 (four) times daily.  . hydrocerin (EUCERIN) CREA Apply 1 application topically 2 (two) times daily.  . hydrOXYzine (ATARAX/VISTARIL) 10 MG tablet Take 1 tablet (10 mg total) by mouth 2 (two) times daily as needed for itching.  Marland Kitchen JARDIANCE 10 MG TABS tablet Take 10 mg by mouth daily.  Marland Kitchen loperamide (IMODIUM) 2 MG capsule Take 1 tablet (2 mg total) by mouth 4 (four) times daily as needed for diarrhea or loose stools.  . pantoprazole (PROTONIX) 40 MG tablet Take 1 tablet (40 mg total) by mouth daily.  . sevelamer carbonate (RENVELA) 800 MG tablet Take 2 tablets (1,600 mg total) by mouth 3 (three) times daily with meals.  . torsemide 60 MG TABS Take 60 mg by mouth daily.  . Travoprost, BAK Free, (TRAVATAN) 0.004 % SOLN ophthalmic solution Place 1 drop into both eyes at bedtime. (Patient taking differently: Place 1 drop into both eyes at bedtime.)   No facility-administered encounter medications on file as of 12/21/2020.    Patient Active Problem List   Diagnosis Date Noted  . Deficit in activities of daily living (ADL) 12/19/2020  . AKI (acute kidney injury) ([place])   . Hypervolemia 11/21/2020  . Hyperthyroidism 11/14/2020  . Acute cholecystitis 11/11/2020  . Callus 11/07/2020  . Heart failure with reduced ejection fraction ([place]) 10/05/2020  . Paroxysmal atrial fibrillation ([place]) 08/30/2020  . Secondary hypercoagulable state ([place]) 08/30/2020  .  Diabetes mellitus ([place]) 07/13/2020  . Acute exacerbation of CHF (congestive heart failure) ([place]) 06/20/2020  . Pruritus 06/20/2020  . Status post bilateral below knee amputation ([place]) 04/20/2019  . Chronic cough 08/03/2018  . Status post reverse total shoulder replacement, left   . Neck pain, chronic 10/20/2017  . Cognitive impairment 08/28/2016  .  Cardiomyopathy ([place])   . Vitamin D deficiency 06/01/2014  . 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[place]) 03/11/2013  . Preventative health care 02/09/2013  . Below knee amputation status, left 12/20/2012  . CKD stage 4 secondary to hypertension ([place]) 11/04/2012  . Normocytic anemia 11/04/2012  . 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 Chronic diarrhea 08/25/2012  . Complete heart block ([place])   . PACEMAKER-[facility] 03/15/2010  . HYPERPARATHYROIDISM, SECONDARY 01/16/2010  . Hyperlipidemia 11/23/2007  . DIABETIC  RETINOPATHY 08/12/2006  . DIABETIC PERIPHERAL NEUROPATHY 08/12/2006  . Essential hypertension 08/12/2006  . PERIPHERAL VASCULAR DISEASE 08/12/2006    Conditions to be addressed/monitored: NIDDM,  HTN, HLD, CAD, HF, CKD stage 4, PAF, CHB with pacemaker, L BKA, hyperthyroidism  Care Plan : CCM RN - Improve Medication Taking Behavior  Updates made by [name] Ellison, RN since 12/21/2020 12:00 AM    Problem: Medication Adherence (Wellness)     Goal: Medication Adherence Improved   Start Date: 09/19/2020  Expected End Date: 03/28/2021  Recent Progress: Not on track  Priority: High  Note:   Current Barriers:  . Non-adherence to prescribed medication regimen . Does not adhere to prescribed medication regimen- spoke with patient via phone, she was hospitalized from 2/23-3/12/22 for acute on chronic heart failure and cardiorenal syndrome and acute on chronic anemia, she was discharge back to her apartment with home health services of PT/OT, when asked, patient states the reason she was hospitalized was "a problem with my kidneys" , she says she was told she may need dialysis but she has not decided if she would agree to the treatment, she states her arms and right leg "look normal" when asked about extremity swelling , during medication reconciliation she said she was still taking the Xarelto, when this RN CCM voiced grave concern that she is unable to take her  medications as prescribed and therefore unable to effectively manage her chronic disease states and suggested she meet with the clinic pharmacist she again refused, she also says she wants to remain in her apartment and does not feel she needs assisted living, she is aware she has a clinic appointment tomorrow and has arranged transportation, patient states at the request of this CCM RN, she will bring her list of medications with her to her clinic appointment tomorrow, she was in agreement to list her [place], as a contact on her chart, patient verified she is still receiving home health services of PT and OT from [place]  Nurse Case Manager Clinical Goal(s):  Marland Kitchen Over the next 30-60 days, patient will work with home health RN and health care team to improve medication taking behavior  Interventions:  . 1:1 collaboration with [name] Payment, MD regarding development and update of comprehensive plan of care as evidenced by provider attestation and co-signature . Inter-disciplinary care team collaboration (see longitudinal plan of care) . Reminded patient to bring medications and/or list of current medications she is taking with her to clinic appointment on 12/19/20 . Messaged clinic provider for clinic appointment on 3/23 that patient is not taking her medications as prescribed and that she was told  by this CCM RN to stop the Xarelto; suggested provide write order for [facility] to assist with medication taking behavior . 12/21/20- Per provider request, sent community message to [facility]) representative to confirm patient is not currently receiving [facility] services. Forwarded reply from [facility] representative that patient is not receiving [facility] services and requested provider write order  Patient Goals/Self-Care Activities Over the next 30-60 days, patient will:  - Patient will self administer medications as prescribed Patient will attend all scheduled provider appointments Patient  will call pharmacy for medication refills Meet with pharmacist to address medication taking barriers  Follow Up Plan: The care management team will reach out to the patient again over the next 30-60 days.          [name] Churn RN, CCM, [facility] RN Care Manager [phone]

## 2020-12-21 NOTE — Progress Notes (Signed)
Internal Medicine Clinic Attending  I saw and evaluated the patient.  I personally confirmed the key portions of the history and exam documented by Dr. Sharon Seller and I reviewed pertinent patient test results.  The assessment, diagnosis, and plan were formulated together and I agree with the documentation in the resident's note.   It is inspiring to see [name] [name] make a nice recovery after her recent hospitalization for severe heart failure with cardiorenal syndrome. She is enjoying her life back at home and remains fiercely independent. She certainly is at risk for future illness, but her current medication regimen seems to be optimizing her chronic conditions. I did counsel her to resume xarelto for prevention of stroke, she had some minor hemorrhoidal bleeding in the hospital, but on balance I think the benefits outweigh the risk of bleeding.

## 2020-12-24 ENCOUNTER — Ambulatory Visit: Payer: Medicare Other | Admitting: *Deleted

## 2020-12-24 ENCOUNTER — Other Ambulatory Visit: Payer: Self-pay | Admitting: Internal Medicine

## 2020-12-24 ENCOUNTER — Encounter (HOSPITAL_COMMUNITY): Payer: Medicare Other

## 2020-12-24 DIAGNOSIS — I5020 Unspecified systolic (congestive) heart failure: Secondary | ICD-10-CM

## 2020-12-24 DIAGNOSIS — Z789 Other specified health status: Secondary | ICD-10-CM

## 2020-12-24 DIAGNOSIS — N184 Chronic kidney disease, stage 4 (severe): Secondary | ICD-10-CM

## 2020-12-24 DIAGNOSIS — I129 Hypertensive chronic kidney disease with stage 1 through stage 4 chronic kidney disease, or unspecified chronic kidney disease: Secondary | ICD-10-CM

## 2020-12-24 DIAGNOSIS — E1122 Type 2 diabetes mellitus with diabetic chronic kidney disease: Secondary | ICD-10-CM

## 2020-12-24 DIAGNOSIS — I10 Essential (primary) hypertension: Secondary | ICD-10-CM

## 2020-12-24 MED ORDER — RIVAROXABAN 15 MG PO TABS: 15.0000 mg | ORAL_TABLET | Freq: Every day | ORAL | 5 refills | Status: DC

## 2020-12-24 NOTE — Chronic Care Management (AMB) (Signed)
Care Management    RN Visit Note  12/24/2020 Name: [name] [name] MRN: [id number] DOB: 1951/08/23  Subjective: [name] [name] is a 70 y.o. year old female who is a primary care patient of [name], MD. The care management team was consulted for assistance with disease management and care coordination.  Consent to Services:   [name] [name] was given information about Care Management services today including:  1. Care Management services includes personalized support from designated clinical staff supervised by her physician, including individualized plan of care and coordination with other care providers 2. 24/7 contact phone numbers for assistance for urgent and routine care needs. 3. The patient may stop case management services at any time by phone call to the office staff.  Patient agreed to services and consent obtained.   Assessment: Review of patient past medical history, allergies, medications, health status, including review of consultants reports, laboratory and other test data, was performed as part of comprehensive evaluation and provision of chronic care management services.   SDOH (Social Determinants of Health) assessments and interventions performed:    Care Plan  Allergies  Allergen Reactions  . Penicillins Itching    Did it involve swelling of the face/tongue/throat, SOB, or low BP? No Did it involve sudden or severe rash/hives, skin peeling, or any reaction on the inside of your mouth or nose? No Did you need to seek medical attention at a hospital or doctor's office? No When did it last happen?10 years If all above answers are "NO", may proceed with cephalosporin use.     Outpatient Encounter Medications as of 12/24/2020  Medication Sig  . albuterol (PROAIR HFA) 108 (90 Base) MCG/ACT inhaler Inhale 2 puffs into the lungs every 6 (six) hours as needed for wheezing or shortness of breath (cough).  Marland Kitchen atorvastatin (LIPITOR) 40 MG tablet Take 1  tablet (40 mg total) by mouth daily.  . calcitRIOL (ROCALTROL) 0.5 MCG capsule Take 1 capsule (0.5 mcg total) by mouth daily.  . diclofenac Sodium (VOLTAREN) 1 % GEL Apply 2 g topically 4 (four) times daily.  . hydrocerin (EUCERIN) CREA Apply 1 application topically 2 (two) times daily.  . hydrOXYzine (ATARAX/VISTARIL) 10 MG tablet Take 1 tablet (10 mg total) by mouth 2 (two) times daily as needed for itching.  Marland Kitchen JARDIANCE 10 MG TABS tablet Take 10 mg by mouth daily.  Marland Kitchen loperamide (IMODIUM) 2 MG capsule Take 1 tablet (2 mg total) by mouth 4 (four) times daily as needed for diarrhea or loose stools.  . pantoprazole (PROTONIX) 40 MG tablet Take 1 tablet (40 mg total) by mouth daily.  . Rivaroxaban (XARELTO) 15 MG TABS tablet Take 1 tablet (15 mg total) by mouth daily with supper.  . sevelamer carbonate (RENVELA) 800 MG tablet Take 2 tablets (1,600 mg total) by mouth 3 (three) times daily with meals.  . torsemide 60 MG TABS Take 60 mg by mouth daily.  . Travoprost, BAK Free, (TRAVATAN) 0.004 % SOLN ophthalmic solution Place 1 drop into both eyes at bedtime. (Patient taking differently: Place 1 drop into both eyes at bedtime.)   No facility-administered encounter medications on file as of 12/24/2020.    Patient Active Problem List   Diagnosis Date Noted  . Deficit in activities of daily living (ADL) 12/19/2020  . AKI (acute kidney injury) ([place])   . Hypervolemia 11/21/2020  . Hyperthyroidism 11/14/2020  . Acute cholecystitis 11/11/2020  . Callus 11/07/2020  . Heart failure with reduced ejection fraction ([place]) 10/05/2020  .  Paroxysmal atrial fibrillation ([place]) 08/30/2020  . Secondary hypercoagulable state ([place]) 08/30/2020  . Diabetes mellitus ([place]) 07/13/2020  . Acute exacerbation of CHF (congestive heart failure) ([place]) 06/20/2020  . Pruritus 06/20/2020  . Status post bilateral below knee amputation ([place]) 04/20/2019  . Chronic cough 08/03/2018  . Status post reverse total shoulder  replacement, left   . Neck pain, chronic 10/20/2017  . Cognitive impairment 08/28/2016  . Cardiomyopathy ([place])   . Vitamin D deficiency 06/01/2014  . 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[place]) 03/11/2013  . Preventative health care 02/09/2013  . Below knee amputation status, left 12/20/2012  . CKD stage 4 secondary to hypertension ([place]) 11/04/2012  . Normocytic anemia 11/04/2012  . 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 Chronic diarrhea 08/25/2012  . Complete heart block ([place])   . PACEMAKER-[facility] 03/15/2010  . HYPERPARATHYROIDISM, SECONDARY 01/16/2010  . Hyperlipidemia 11/23/2007  . DIABETIC  RETINOPATHY 08/12/2006  . DIABETIC PERIPHERAL NEUROPATHY 08/12/2006  . Essential hypertension 08/12/2006  . PERIPHERAL VASCULAR DISEASE 08/12/2006    Conditions to be addressed/monitored: NIDDM,  HTN, HLD, CAD, HF, CKD stage 4, PAF, CHB with pacemaker, L BKA, hyperthyroidism  Care Plan : CCM RN - Improve Medication Taking Behavior  Updates made by [name], RN since 12/24/2020 12:00 AM    Problem: Medication Adherence (Wellness)     Goal: Medication Adherence Improved   Start Date: 09/19/2020  Expected End Date: 03/28/2021  Recent Progress: Not on track  Priority: High  Note:   Current Barriers:  . Non-adherence to prescribed medication regimen . Does not adhere to prescribed medication regimen- spoke with patient via phone, she was hospitalized from 2/23-3/12/22 for acute on chronic heart failure and cardiorenal syndrome and acute on chronic anemia, she was discharge back to her apartment with home health services of PT/OT, when asked, patient states the reason she was hospitalized was "a problem with my kidneys" , she says she was told she may need dialysis but she has not decided if she would agree to the treatment, she states her arms and right leg "look normal" when asked about extremity swelling , during medication reconciliation she said she was  still taking the Xarelto, when this RN CCM voiced grave concern that she is unable to take her medications as prescribed and therefore unable to effectively manage her chronic disease states and suggested she meet with the clinic pharmacist she again refused, she also says she wants to remain in her apartment and does not feel she needs assisted living, she is aware she has a clinic appointment tomorrow and has arranged transportation, patient states at the request of this CCM RN, she will bring her list of medications with her to her clinic appointment tomorrow, she was in agreement to list her [place], as a contact on her chart, patient verified she is still receiving home health services of PT and OT from [place]  Nurse Case Manager Clinical Goal(s):  Marland Kitchen Over the next 30-60 days, patient will work with home health RN and health care team to improve medication taking behavior  Interventions:  . 1:1 collaboration with [name], MD regarding development and update of comprehensive plan of care as evidenced by provider attestation and co-signature . Inter-disciplinary care team collaboration (see longitudinal plan of care) . Reminded patient to bring medications and/or list of current medications she is taking with her to clinic appointment on 12/19/20 . Messaged clinic provider for clinic appointment on 3/23  that patient is not taking her medications as prescribed and that she was told by this CCM RN to stop the Xarelto; suggested provide write order for [facility] to assist with medication taking behavior . 12/21/20- Per provider request, sent community message to [facility]) representative to confirm patient is not currently receiving [facility] services. Forwarded reply from [facility] representative that patient is not receiving [facility] services and requested provider write order  . 12/24/20- Secured order for [facility] from provider and notified Patsy [place], [place]  representative, that order was written Patient Goals/Self-Care Activities Over the next 30-60 days, patient will:  - Patient will self administer medications as prescribed Patient will attend all scheduled provider appointments Patient will call pharmacy for medication refills Meet with pharmacist to address medication taking barriers  Follow Up Plan: The care management team will reach out to the patient again over the next 30-60 days.          [name] RN, CCM, [facility] RN Care Manager [phone]

## 2020-12-24 NOTE — Progress Notes (Signed)
Internal Medicine Clinic Resident  I have personally reviewed this encounter including the documentation in this note and/or discussed this patient with the care management provider. I will address any urgent items identified by the care management provider and will communicate my actions to the patient's PCP. I have reviewed the patient's CCM visit with my supervising attending, Dr Heber Baneberry.  Mitzi Hansen, MD Internal Medicine Resident PGY-2 [facility] Pager: [phone] 12/24/2020 11:45 AM

## 2020-12-24 NOTE — Progress Notes (Signed)
Internal Medicine Clinic Resident  I have personally reviewed this encounter including the documentation in this note and/or discussed this patient with the care management provider. I will address any urgent items identified by the care management provider and will communicate my actions to the patient's PCP. I have reviewed the patient's CCM visit with my supervising attending, Dr Heber [place].  Mitzi Hansen, MD Internal Medicine Resident PGY-2 [facility] Pager: [phone] 12/24/2020 11:54 AM

## 2020-12-24 NOTE — Addendum Note (Signed)
Addended by: Molli Hazard A on: 12/24/2020 10:02 AM   Modules accepted: Orders

## 2020-12-24 NOTE — Progress Notes (Signed)
H

## 2020-12-25 ENCOUNTER — Telehealth: Payer: Self-pay

## 2020-12-25 ENCOUNTER — Other Ambulatory Visit: Payer: Self-pay | Admitting: Internal Medicine

## 2020-12-25 DIAGNOSIS — K529 Noninfective gastroenteritis and colitis, unspecified: Secondary | ICD-10-CM

## 2020-12-25 NOTE — Telephone Encounter (Signed)
[place] is calling a request for pt loperamide (IMODIUM) 2 MG capsule sent to  [facility], [facility] Phone:  [phone]  Fax:  [phone]

## 2020-12-25 NOTE — Telephone Encounter (Signed)
Se  surescript request.

## 2021-01-01 ENCOUNTER — Telehealth: Payer: Self-pay

## 2021-01-01 NOTE — Progress Notes (Signed)
Remote pacemaker transmission.   

## 2021-01-01 NOTE — Telephone Encounter (Signed)
I spoke to [facility] [phone] will see if they will be able to help patient. I will follow up with them on Thursday April 7 to see if they can help and get the start of care date [place], [place] C4/5/20223:25 PM

## 2021-01-02 ENCOUNTER — Telehealth: Payer: Self-pay

## 2021-01-02 NOTE — Telephone Encounter (Signed)
Spoke with patient and scheduled an in-person Palliative Consult for 01/16/21 @ 12:30PM  COVID screening was negative. No pets in home. Patient lives alone.  Consent obtained; updated Outlook/Netsmart/Team List and Epic.  Family is aware they may be receiving a call from NP the day before or day of to confirm appointment.

## 2021-01-03 ENCOUNTER — Encounter: Payer: Medicare Other | Admitting: Pharmacist

## 2021-01-04 NOTE — Telephone Encounter (Signed)
Colan Neptune, Collan Schoenfeld C, [place]; Patsy [place] Yes ma'am we have being seeing her since 3/16 for SN/PT/OT. Our orders came from her [facility] discharge.   thanks.  Angie        Previous Messages   ----- Message -----  From: Judge Stall, NT  Sent: 01/03/2021  3:52 PM EDT  To: Patsy [place]  Subject: home health                    Hi angela just checking to see if this patient is receiving home health services from your agency [place], [place] C4/7/20223:52 PM

## 2021-01-07 ENCOUNTER — Other Ambulatory Visit: Payer: Self-pay | Admitting: Student

## 2021-01-07 ENCOUNTER — Other Ambulatory Visit: Payer: Self-pay | Admitting: Internal Medicine

## 2021-01-07 DIAGNOSIS — I10 Essential (primary) hypertension: Secondary | ICD-10-CM

## 2021-01-08 ENCOUNTER — Other Ambulatory Visit: Payer: Self-pay

## 2021-01-08 ENCOUNTER — Ambulatory Visit (INDEPENDENT_AMBULATORY_CARE_PROVIDER_SITE_OTHER): Payer: Medicare Other | Admitting: Pharmacist

## 2021-01-08 DIAGNOSIS — Z79899 Other long term (current) drug therapy: Secondary | ICD-10-CM | POA: Diagnosis present

## 2021-01-08 DIAGNOSIS — I25118 Atherosclerotic heart disease of native coronary artery with other forms of angina pectoris: Secondary | ICD-10-CM | POA: Diagnosis not present

## 2021-01-08 NOTE — Patient Instructions (Signed)
[name] [name] it was a pleasure seeing you today.   Today we reviewed all of the medications you are currently taking. Included is an updated medication list. Please continue taking all medications as prescribed on this list.  If you have any questions please call the clinic and ask to speak with me.  Follow-up with provider at next scheduled appt.

## 2021-01-08 NOTE — Progress Notes (Signed)
   Subjective/Objective:    Patient ID: [name] [name], female    DOB: [birth year], 70 y.o.   MRN: [id number]  HPI Patient is a 70 y.o. female who presents for medication review and management.  She is in good spirits and presents with assistance of wheelchair. Patient was referred on 11/21/20 and last seen by provider on 12/19/20.  Patient did not bring her medications with her but states that she will know if she is taking them when she hears the name. I reviewed all of the medications currently on the patient's medication list and she stated there were no missing medications or anything additional that she was taking. Despite this information, when looking at outside medication reconciliation, three medications: gabapentin, valsartan, and BiDil were listed. Patient states she is in fact taking those medications as well. "If they are listed then I am taking them." She states she uses a pill organizer that she fills weekly to help her remember. Patient was in a rush to leave and after reviewing medications asked if she could go.   Medication Adherence Questionnaire (A score of 2 or more points indicates risk for nonadherence)  Do you know what each of your medicines is for? 1 (1 point if no)  Do you ever have trouble remembering to take your medicine? 0 (2 points if yes)  Do you ever not take a medicine because you feel you do not need it?  0 (1 point if yes)  Do you think that any of your medicines is not helping you? 0 (1 point if yes)  Do you have any physical problems such as vision loss that keep you from taking your medicines as prescribed?  0 (2 points if yes)  Do you think any of your medicine is causing a side effect? 0 (1 point if yes)  Do you know the names of ALL of your medicines? 1 (1 point if no)  Do you think that you need ALL of your medicines? 0 (1 point if no)  In the past 6 months, have you missed getting a refill or a new prescription filled on time?  0 (1 point if yes)   How often do you miss taking a dose of medicine?  1 Never (0 points), 1 or 2 times a month (1 points), 1 time a week (2 points), 2 or more times a week (3 points).   TOTAL SCORE 3/14    Assessment/Plan:   Understanding of regimen: poor  Understanding of indications: fair  Potential of compliance: fair  Patient has known adherence challenges based on score of 3 for questionnaire. Barriers include: lack of knowledge and occasional forgetfulness. Patient has longstanding barriers with adherence to medications. Information provided during visit today differs from recent discussions other providers have had with patient. Without medications physically present there is some uncertainty regarding which medications the patient is taking. Medication list reviewed and updated. Patient was provided with a printed medication list.   Follow-up appointment with provider at next scheduled appt. Written patient instructions provided.  This appointment required 30 minutes of patient care (this includes precharting, chart review, review of results, and face-to-face care).  Thank you for involving pharmacy to assist in providing this patient's care.

## 2021-01-14 ENCOUNTER — Other Ambulatory Visit: Payer: Self-pay

## 2021-01-14 ENCOUNTER — Other Ambulatory Visit: Payer: Self-pay | Admitting: Student

## 2021-01-14 DIAGNOSIS — L299 Pruritus, unspecified: Secondary | ICD-10-CM

## 2021-01-14 MED ORDER — HYDROXYZINE HCL 10 MG PO TABS: 10.0000 mg | ORAL_TABLET | Freq: Two times a day (BID) | ORAL | 0 refills | Status: DC | PRN

## 2021-01-14 NOTE — Telephone Encounter (Signed)
Need refill on  hydrOXYzine (ATARAX/VISTARIL) 10 MG tablet  ;pt contact [place], [place]

## 2021-01-16 ENCOUNTER — Other Ambulatory Visit: Payer: Self-pay

## 2021-01-16 ENCOUNTER — Other Ambulatory Visit: Payer: Medicaid Other | Admitting: Hospice

## 2021-01-16 DIAGNOSIS — R0600 Dyspnea, unspecified: Secondary | ICD-10-CM

## 2021-01-16 DIAGNOSIS — R0609 Other forms of dyspnea: Secondary | ICD-10-CM

## 2021-01-16 DIAGNOSIS — I5020 Unspecified systolic (congestive) heart failure: Secondary | ICD-10-CM

## 2021-01-16 DIAGNOSIS — Z515 Encounter for palliative care: Secondary | ICD-10-CM

## 2021-01-16 NOTE — Progress Notes (Signed)
[facility] Consult Note Telephone: [phone]  Fax: [phone]  PATIENT NAME: [name] [name] [address] [place] [address] [phone] (home)  DOB: [birth year] MRN: [id number]  PRIMARY CARE PROVIDER:    [name], MD,  [place] [place] [address] [phone]  REFERRING PROVIDER:   [name], MD [place],  [place] [address] [phone]  RESPONSIBLE PARTY:  Self HCPOA - friend - [name] [name]    Name Relation Home Work Thompson Sister [phone]  [phone]   [name] [name] [phone]  [phone]   [place]   [phone]      I met face to face with patient and family at home. Palliative Care was asked to follow this patient by consultation request of  [name] Payment, MD to address advance care planning and complex medical decision making. This is the initial visit.    ASSESSMENT AND / RECOMMENDATIONS:   Advance Care Planning: Our advance care planning conversation included a discussion about:     The value and importance of advance care planning   Difference between Hospice and Palliative care  Exploration of goals of care in the event of a sudden injury or illness   Identification and preparation of a healthcare agent   Review and updating or creation of an  advance directive document .  Decision not to resuscitate or to de-escalate disease focused treatments due to poor prognosis.   She said she does not want dialysis.  CODE STATUS: Patient affirmed she is a DNR  Goals of Care: Goals include to maximize quality of life and symptom management. Patient elects she does not want dialysis.    I spent 46  minutes providing this initial consultation. More than 50% of the time in this consultation was spent on counseling patient and coordinating  communication. --------------------------------------------------------------------------------------------------------------------------------------  Symptom Management/Plan: Shortness of breath: Use rescue inhaler Albuterol as ordered. Calming. Slow deep breathing. Pacing activities and rest in-between. Use of fan for air circulation. Continue Torsemide, Bidil HTN: Patient  Is taking  Valsartan as ordered. Follow up with Cardiologist as planned 01/17/2021.  Pulmonolgist consult as needed.  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  Type 2 DM: controlled with diet. Last A1c 11/12/2020 is 5.  Follow up: Palliative care will continue to follow for complex medical decision making, advance care planning, and clarification of goals. Return 6 weeks or prn.Encouraged to call provider sooner with any concerns.   PPS: 40%, self propels wheelchair  HOSPICE ELIGIBILITY/DIAGNOSIS: TBD  Chief Complaint: Palliative Initial visit/Shortness of breath  HISTORY OF PRESENT ILLNESS:  [name] [name] is a 70 y.o. year old female  with multiple medical conditions including  Shortness of breath which is chronic, intermittent, worsened with exertion in the last 3 days. Shortness of breath impairs her independence; likely related to history of worsening diastolic congestive heart failure with worsening chronic kidney disease. Patient was hospitalized for biventricular heart failure 2/23 - 12/08/2020. Patient is followed by a Cardiologist. She reports that resting and taking her her water pill are helpful. History of  CKD IV, chronic heart failure with reduced ejection fraction complicated by cardiorenal syndrome, CAD with sinus arrest s/p pacemaker.  History obtained from review of EMR, discussion with primary team, caregiver, family and/or [name] [name].  Review and summarization of Epic records shows history from other than patient. Rest of  10 point ROS asked and  negative.  Palliative Care was asked to follow this patient by consultation request of [name] Payment, MD to help address complex decision making in the context of advance care planning and goals of care clarification.    Review of lab tests/diagnostics   Results for [name] [name] (MRN [id number]) as of 01/16/2021 11:45  Ref. Range 12/19/2020 10:42  Sodium Latest Ref Range: 134 - 144 mmol/L 149 (H)  Potassium Latest Ref Range: 3.5 - 5.2 mmol/L 4.5  Chloride Latest Ref Range: 96 - 106 mmol/L 113 (H)  CO2 Latest Ref Range: 20 - 29 mmol/L 15 (L)  Glucose Latest Ref Range: 65 - 99 mg/dL 82  BUN Latest Ref Range: 8 - 27 mg/dL 47 (H)  Creatinine Latest Ref Range: 0.57 - 1.00 mg/dL 2.99 (H)  Calcium Latest Ref Range: 8.7 - 10.3 mg/dL 9.3  Anion gap Latest Ref Range: 10.0 - 18.0 mmol/L 21.0 (H)  BUN/Creatinine Ratio Latest Ref Range: 12 - 28  16  EGFR Latest Ref Range: >59 mL/min/1.73 16 (L)   Results for [name] [name] (MRN [id number]) as of 01/16/2021 17:12  Ref. Range 11/12/2020 01:19  Hemoglobin A1C Latest Ref Range: 4.8 - 5.6 % 5.0   ROS General: NAD EYES: denies vision changes ENMT: denies dysphagia Cardiovascular: denies chest pain/discomfort Pulmonary: denies cough, denies SOB at rest but with exertion Abdomen: endorses good appetite, denies constipation, endorses continence of bowel GU: denies dysuria, urinary frequency MSK:  Endorses weakness, no falls reported Skin: denies rashes or wounds Neurological: denies pain, denies insomnia Psych: Endorses positive mood Heme/lymph/immuno: denies bruises, abnormal bleeding  Physical Exam: R18 P 98% RA P71 refused BP check Constitutional: NAD General: Well groomed EYES: anicteric sclera, lids intact, no discharge  ENMT: Moist mucous membrane CV: S1 S2, RRR, no LE edema Pulmonary: LCTA, no increased work of breathing, no cough, Abdomen: active BS + 4 quadrants, soft and non tender, no ascites GU: no suprapubic  tenderness MSK: weakness, L BKA Skin: warm and dry, no rashes or wounds on visible skin Neuro:  weakness, otherwise non focal Psych: non-anxious affect Hem/lymph/immuno: no widespread bruising   PAST MEDICAL HISTORY:  Active Ambulatory Problems    Diagnosis Date Noted  . DIABETIC  RETINOPATHY 08/12/2006  . DIABETIC PERIPHERAL NEUROPATHY 08/12/2006  . HYPERPARATHYROIDISM, SECONDARY 01/16/2010  . Hyperlipidemia 11/23/2007  . Essential hypertension 08/12/2006  . PERIPHERAL VASCULAR DISEASE 08/12/2006  . PACEMAKER-[facility] 03/15/2010  . Complete heart block ([place])   . Chronic diarrhea 08/25/2012  . CKD stage 4 secondary to hypertension ([place]) 11/04/2012  . Normocytic anemia 11/04/2012  . 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 Below knee amputation status, left 12/20/2012  . Preventative health care 02/09/2013  . 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[place]) 03/11/2013  . Vitamin D deficiency 06/01/2014  . Cardiomyopathy ([place])   . Cognitive impairment 08/28/2016  . Neck pain, chronic 10/20/2017  . Status post reverse total shoulder replacement, left   . Chronic cough 08/03/2018  . Status post bilateral below knee amputation ([place]) 04/20/2019  . Acute exacerbation of CHF (congestive heart failure) ([place]) 06/20/2020  . Pruritus 06/20/2020  . Diabetes mellitus ([place]) 07/13/2020  . Paroxysmal atrial fibrillation ([place]) 08/30/2020  . Secondary hypercoagulable state ([place]) 08/30/2020  . Heart failure with reduced ejection fraction ([place]) 10/05/2020  . Callus 11/07/2020  . Acute cholecystitis 11/11/2020  . Hyperthyroidism 11/14/2020  . Hypervolemia 11/21/2020  . AKI (acute kidney injury) ([place])   . Deficit  in activities of daily living (ADL) 12/19/2020   Resolved Ambulatory Problems    Diagnosis Date Noted  . ONYCHOMYCOSIS 08/12/2006  . IDDM 08/12/2006  . NEPHROPATHY, DIABETIC 08/12/2006  . ANEMIA, NORMOCYTIC 08/22/2008  . TOBACCO USER 11/23/2007  . Carpal  tunnel syndrome 08/12/2006  . CORONARY ARTERY DISEASE 08/12/2006  . AV BLOCK, 1ST DEGREE 03/15/2010  . Cardiac arrest ([place]) 06/02/2004  . ALLERGIC RHINITIS, SEASONAL, MILD 08/12/2006  . NEPHROLITHIASIS 08/07/2008  . FOOT ULCER 08/12/2006  . OSTEOARTHRITIS, HAND 08/12/2006  . ALCOHOL ABUSE, HX OF 08/12/2006  . TOTAL ABDOMINAL HYSTERECTOMY, HX OF 08/12/2006  . Sinus arrest   . Tension headache 02/07/2011  . Dizziness 05/08/2011  . Chills (without fever) 08/28/2011  . Hematochezia 09/27/2011  . Colitis, ischemic ([place]) 10/09/2011  . Right shoulder pain 10/17/2011  . Atherosclerosis of native arteries of the extremities with ulceration(440.23) 12/03/2011  . Atherosclerosis of native arteries of the extremities with intermittent claudication 01/28/2012  . Seasonal allergies 02/09/2012  . Abscess of left foot 10/29/2012  . "walking corpse" syndrome 11/04/2012  . Thrombocytopenia ([place]) 11/04/2012  . s/p Hip fracture 11/04/2012  . Adjustment disorder with mixed anxiety and depressed mood 01/05/2013  . Aftercare following surgery of the circulatory system, [place] 01/26/2013  . Atherosclerosis of native arteries of the extremities with ulceration(440.23) 08/03/2013  . Aftercare following surgery of the circulatory system, [place] 08/03/2013  . Depression 10/19/2013  . SOB (shortness of breath) 06/01/2014  . Diabetic peripheral neuropathy associated with type 2 diabetes mellitus ([place]) 08/28/2014  . Dyspnea 11/02/2014  . Pulmonary edema 11/02/2014  . Pleural effusion 11/02/2014  . Acute on chronic combined systolic and diastolic CHF (congestive heart failure) ([place]) 11/03/2014  . C. difficile diarrhea 11/04/2014  . Fall 08/28/2016  . Trigger finger 11/14/2016  . Adhesive capsulitis of left shoulder 12/11/2017  . Aftercare following left shoulder joint replacement surgery 03/30/2018  . Porokeratosis 04/20/2019  . Laceration of skin of right lower leg 03/14/2020   Past Medical History:   Diagnosis Date  . Acute GI bleeding 09/26/11  . Anemia   . Atrioventricular block, complete ([place])   . Bilateral carpal tunnel syndrome   . Blood transfusion 2005  . CA - cardiac arrest   . CKD (chronic kidney disease), stage IV ([place])   . diabetes mellitus 30 yrs  . Diabetic foot ulcer ([place])   . Glaucoma   . Headache(784.0)   . History of alcohol abuse   . History of kidney stones   . Hypercholesteremia   . Hypertension   . Incidental pulmonary nodule 07/22/08  . Memory loss of   . OA (osteoarthritis)   . Onychomycosis   . Pacemaker - [facility] [employer] 11/24/2009  . PVD (peripheral vascular disease) ([place])   . Rotator cuff tear 01/2017  . Tobacco abuse     SOCIAL HX:  Social History   Tobacco Use  . Smoking status: Current Every Day Smoker    Packs/day: 0.10    Years: 50.00    Pack years: 5.00    Types: Cigarettes  . Smokeless tobacco: Never Used  . Tobacco comment: 1 cigarette daily  Substance Use Topics  . Alcohol use: No    Alcohol/week: 1.0 standard drink    Types: 1 Cans of beer per week    Comment: 12/16/2012 "last beer was last month; have one q once in awhile"    Family /Caregiver/Community Supports: Patient has a home health aid 7 days, 2 hours a day.  FAMILY HX:  Family History  Problem Relation Age of Onset  . Heart disease Mother        died at 24  . Diabetes Mother   . Coronary artery disease Sister        in her 81s  . Stroke Father   . Hypertension Maternal Aunt   . Diabetes Maternal Aunt   . Breast cancer Neg Hx       ALLERGIES:  Allergies  Allergen Reactions  . Penicillins Itching    Did it involve swelling of the face/tongue/throat, SOB, or low BP? No Did it involve sudden or severe rash/hives, skin peeling, or any reaction on the inside of your mouth or nose? No Did you need to seek medical attention at a hospital or doctor's office? No When did it last happen?10 years If all above answers are "NO", may proceed with cephalosporin  use.       PERTINENT MEDICATIONS:  Outpatient Encounter Medications as of 01/16/2021  Medication Sig  . albuterol (PROAIR HFA) 108 (90 Base) MCG/ACT inhaler Inhale 2 puffs into the lungs every 6 (six) hours as needed for wheezing or shortness of breath (cough).  Marland Kitchen atorvastatin (LIPITOR) 40 MG tablet Take 1 tablet (40 mg total) by mouth daily.  Marland Kitchen BIDIL 20-37.5 MG tablet Take 1 tablet by mouth 3 (three) times daily.  . calcitRIOL (ROCALTROL) 0.5 MCG capsule Take 1 capsule (0.5 mcg total) by mouth daily.  . diclofenac Sodium (VOLTAREN) 1 % GEL Apply 2 g topically 4 (four) times daily.  Marland Kitchen gabapentin (NEURONTIN) 300 MG capsule Take 1 capsule by mouth 2 (two) times daily.  . hydrocerin (EUCERIN) CREA Apply 1 application topically 2 (two) times daily. (Patient not taking: Reported on 01/08/2021)  . hydrOXYzine (ATARAX/VISTARIL) 10 MG tablet Take 1 tablet (10 mg total) by mouth 2 (two) times daily as needed for itching.  Marland Kitchen JARDIANCE 10 MG TABS tablet Take 1 tablet (10 mg total) by mouth daily before breakfast.  . loperamide (IMODIUM) 2 MG capsule Take 1 capsule(2 mg total) by mouth 4 (four) times daily as needed for diarrhea or loose stools. (Patient not taking: Reported on 01/08/2021)  . pantoprazole (PROTONIX) 40 MG tablet Take 1 tablet (40 mg total) by mouth daily.  . Rivaroxaban (XARELTO) 15 MG TABS tablet Take 1 tablet (15 mg total) by mouth daily with supper.  . sevelamer carbonate (RENVELA) 800 MG tablet Take 2 tablets (1,600 mg total) by mouth 3 (three) times daily with meals.  . torsemide 60 MG TABS Take 60 mg by mouth daily.  . Travoprost, BAK Free, (TRAVATAN) 0.004 % SOLN ophthalmic solution Place 1 drop into both eyes at bedtime. (Patient taking differently: Place 1 drop into both eyes at bedtime.)  . valsartan (DIOVAN) 80 MG tablet Take 1 tablet by mouth 2 (two) times daily.   No facility-administered encounter medications on file as of 01/16/2021.    Thank you for the opportunity to  participate in the care of [name] [name].  The palliative care team will continue to follow. Please call our office at [phone] if we can be of additional assistance.   Note: Portions of this note were generated with [employer]. Dictation errors may occur despite best attempts at proofreading.  Teodoro Spray, NP

## 2021-01-17 ENCOUNTER — Other Ambulatory Visit: Payer: Self-pay

## 2021-01-17 ENCOUNTER — Encounter (HOSPITAL_COMMUNITY): Payer: Self-pay | Admitting: Internal Medicine

## 2021-01-17 ENCOUNTER — Ambulatory Visit (HOSPITAL_COMMUNITY)
Admission: RE | Admit: 2021-01-17 | Discharge: 2021-01-17 | Disposition: A | Discharge status: Home / Self Care | Payer: Medicare Other | Source: Ambulatory Visit | Attending: Internal Medicine | Admitting: Internal Medicine

## 2021-01-17 VITALS — BP 138/80 | HR 70 | Wt 138.0 lb

## 2021-01-17 DIAGNOSIS — I2510 Atherosclerotic heart disease of native coronary artery without angina pectoris: Secondary | ICD-10-CM | POA: Diagnosis not present

## 2021-01-17 DIAGNOSIS — Z79899 Other long term (current) drug therapy: Secondary | ICD-10-CM | POA: Diagnosis not present

## 2021-01-17 DIAGNOSIS — Z89512 Acquired absence of left leg below knee: Secondary | ICD-10-CM | POA: Diagnosis not present

## 2021-01-17 DIAGNOSIS — Z720 Tobacco use: Secondary | ICD-10-CM

## 2021-01-17 DIAGNOSIS — Z8249 Family history of ischemic heart disease and other diseases of the circulatory system: Secondary | ICD-10-CM | POA: Insufficient documentation

## 2021-01-17 DIAGNOSIS — I5022 Chronic systolic (congestive) heart failure: Secondary | ICD-10-CM

## 2021-01-17 DIAGNOSIS — N184 Chronic kidney disease, stage 4 (severe): Secondary | ICD-10-CM | POA: Diagnosis not present

## 2021-01-17 DIAGNOSIS — Z9111 Patient's noncompliance with dietary regimen: Secondary | ICD-10-CM | POA: Diagnosis not present

## 2021-01-17 DIAGNOSIS — F17210 Nicotine dependence, cigarettes, uncomplicated: Secondary | ICD-10-CM | POA: Insufficient documentation

## 2021-01-17 DIAGNOSIS — E1142 Type 2 diabetes mellitus with diabetic polyneuropathy: Secondary | ICD-10-CM | POA: Diagnosis not present

## 2021-01-17 DIAGNOSIS — I480 Paroxysmal atrial fibrillation: Secondary | ICD-10-CM | POA: Insufficient documentation

## 2021-01-17 DIAGNOSIS — Z7901 Long term (current) use of anticoagulants: Secondary | ICD-10-CM | POA: Insufficient documentation

## 2021-01-17 DIAGNOSIS — Z8674 Personal history of sudden cardiac arrest: Secondary | ICD-10-CM | POA: Insufficient documentation

## 2021-01-17 DIAGNOSIS — I130 Hypertensive heart and chronic kidney disease with heart failure and stage 1 through stage 4 chronic kidney disease, or unspecified chronic kidney disease: Secondary | ICD-10-CM | POA: Diagnosis present

## 2021-01-17 DIAGNOSIS — I5020 Unspecified systolic (congestive) heart failure: Secondary | ICD-10-CM

## 2021-01-17 DIAGNOSIS — Z950 Presence of cardiac pacemaker: Secondary | ICD-10-CM | POA: Diagnosis not present

## 2021-01-17 DIAGNOSIS — Z791 Long term (current) use of non-steroidal anti-inflammatories (NSAID): Secondary | ICD-10-CM | POA: Diagnosis not present

## 2021-01-17 DIAGNOSIS — I442 Atrioventricular block, complete: Secondary | ICD-10-CM | POA: Diagnosis not present

## 2021-01-17 DIAGNOSIS — Z7984 Long term (current) use of oral hypoglycemic drugs: Secondary | ICD-10-CM | POA: Diagnosis not present

## 2021-01-17 DIAGNOSIS — E1122 Type 2 diabetes mellitus with diabetic chronic kidney disease: Secondary | ICD-10-CM | POA: Insufficient documentation

## 2021-01-17 DIAGNOSIS — Z833 Family history of diabetes mellitus: Secondary | ICD-10-CM | POA: Insufficient documentation

## 2021-01-17 HISTORY — DX: Heart failure, unspecified: I50.9

## 2021-01-17 LAB — BASIC METABOLIC PANEL
Anion gap: 8 (ref 5–15)
BUN: 48 mg/dL — ABNORMAL HIGH (ref 8–23)
CO2: 14 mmol/L — ABNORMAL LOW (ref 22–32)
Calcium: 8.7 mg/dL — ABNORMAL LOW (ref 8.9–10.3)
Chloride: 117 mmol/L — ABNORMAL HIGH (ref 98–111)
Creatinine, Ser: 3.58 mg/dL — ABNORMAL HIGH (ref 0.44–1.00)
GFR, Estimated: 13 mL/min — ABNORMAL LOW (ref >60)
Glucose, Bld: 101 mg/dL — ABNORMAL HIGH (ref 70–99)
Potassium: 5.1 mmol/L (ref 3.5–5.1)
Sodium: 139 mmol/L (ref 135–145)

## 2021-01-17 LAB — BRAIN NATRIURETIC PEPTIDE: B Natriuretic Peptide: >4500 pg/mL — ABNORMAL HIGH (ref 0.0–100.0)

## 2021-01-17 MED ORDER — TORSEMIDE 60 MG PO TABS: 60.0000 mg | ORAL_TABLET | Freq: Two times a day (BID) | ORAL | 3 refills | Status: DC

## 2021-01-17 NOTE — Patient Instructions (Signed)
Increase Torsemide to 60 mg (3 tabs) Twice daily   Labs done today, we will call you for abnormal results  Your physician recommends that you schedule a follow-up appointment in: 2-3 weeks  If you have any questions or concerns before your next appointment please send [place] a message through [place] or call our office at [phone].    TO LEAVE A MESSAGE FOR THE NURSE SELECT OPTION 2, PLEASE LEAVE A MESSAGE INCLUDING: . YOUR NAME . DATE OF BIRTH . CALL BACK NUMBER . REASON FOR CALL**this is important as we prioritize the call backs  [place] AS LONG AS YOU CALL BEFORE 4:00 PM  At the [facility], you and your health needs are our priority. As part of our continuing mission to provide you with exceptional heart care, we have created designated Provider Care Teams. These Care Teams include your primary Cardiologist (physician) and Advanced Practice Providers (APPs- Physician Assistants and Nurse Practitioners) who all work together to provide you with the care you need, when you need it.   You may see any of the following providers on your designated Care Team at your next follow up: Marland Kitchen Dr Glori Bickers . Dr Loralie Champagne . Dr Vickki Muff . Darrick Grinder, NP . Lyda Jester, [place] . Audry Riles, PharmD   Please be sure to bring in all your medications bottles to every appointment.
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
## 2021-01-21 ENCOUNTER — Ambulatory Visit: Payer: Medicare Other | Admitting: *Deleted

## 2021-01-21 DIAGNOSIS — I129 Hypertensive chronic kidney disease with stage 1 through stage 4 chronic kidney disease, or unspecified chronic kidney disease: Secondary | ICD-10-CM

## 2021-01-21 DIAGNOSIS — I10 Essential (primary) hypertension: Secondary | ICD-10-CM

## 2021-01-21 DIAGNOSIS — I5020 Unspecified systolic (congestive) heart failure: Secondary | ICD-10-CM

## 2021-01-21 DIAGNOSIS — E1122 Type 2 diabetes mellitus with diabetic chronic kidney disease: Secondary | ICD-10-CM

## 2021-01-21 DIAGNOSIS — I480 Paroxysmal atrial fibrillation: Secondary | ICD-10-CM

## 2021-01-21 DIAGNOSIS — N184 Chronic kidney disease, stage 4 (severe): Secondary | ICD-10-CM

## 2021-01-21 NOTE — Chronic Care Management (AMB) (Signed)
Care Management    RN Visit Note  01/21/2021 Name: [name] [name] MRN: [id number] DOB: 01-Oct-1950  Subjective: [name] [name] is a 70 y.o. year old female who is a primary care patient of [name] Payment, MD. The care management team was consulted for assistance with disease management and care coordination needs.    Engaged with patient by telephone for follow up visit in response to provider referral for case management and/or care coordination services.   Consent to Services:   [name] was given information about Care Management services today including:  1. Care Management services includes personalized support from designated clinical staff supervised by her physician, including individualized plan of care and coordination with other care providers 2. 24/7 contact phone numbers for assistance for urgent and routine care needs. 3. The patient may stop case management services at any time by phone call to the office staff.  Patient agreed to services and consent obtained.   Assessment: Review of patient past medical history, allergies, medications, health status, including review of consultants reports, laboratory and other test data, was performed as part of comprehensive evaluation and provision of chronic care management services.   SDOH (Social Determinants of Health) assessments and interventions performed:    Care Plan  Allergies  Allergen Reactions  . Penicillins Itching    Did it involve swelling of the face/tongue/throat, SOB, or low BP? No Did it involve sudden or severe rash/hives, skin peeling, or any reaction on the inside of your mouth or nose? No Did you need to seek medical attention at a hospital or doctor's office? No When did it last happen?10 years If all above answers are "NO", may proceed with cephalosporin use.     Outpatient Encounter Medications as of 01/21/2021  Medication Sig Note  . albuterol (PROAIR HFA) 108 (90 Base) MCG/ACT  inhaler Inhale 2 puffs into the lungs every 6 (six) hours as needed for wheezing or shortness of breath (cough). 01/17/2021: .   . atorvastatin (LIPITOR) 40 MG tablet Take 1 tablet (40 mg total) by mouth daily.   Marland Kitchen BIDIL 20-37.5 MG tablet Take 1 tablet by mouth 3 (three) times daily.   . calcitRIOL (ROCALTROL) 0.5 MCG capsule Take 1 capsule (0.5 mcg total) by mouth daily.   . diclofenac Sodium (VOLTAREN) 1 % GEL Apply 2 g topically 4 (four) times daily.   Marland Kitchen gabapentin (NEURONTIN) 300 MG capsule Take 1 capsule by mouth 2 (two) times daily.   . hydrocerin (EUCERIN) CREA Apply 1 application topically 2 (two) times daily.   . hydrOXYzine (ATARAX/VISTARIL) 10 MG tablet Take 1 tablet (10 mg total) by mouth 2 (two) times daily as needed for itching.   Marland Kitchen JARDIANCE 10 MG TABS tablet Take 1 tablet (10 mg total) by mouth daily before breakfast.   . loperamide (IMODIUM) 2 MG capsule Take 1 capsule(2 mg total) by mouth 4 (four) times daily as needed for diarrhea or loose stools.   . pantoprazole (PROTONIX) 40 MG tablet Take 1 tablet (40 mg total) by mouth daily.   . Rivaroxaban (XARELTO) 15 MG TABS tablet Take 1 tablet (15 mg total) by mouth daily with supper.   . sevelamer carbonate (RENVELA) 800 MG tablet Take 2 tablets (1,600 mg total) by mouth 3 (three) times daily with meals.   . Torsemide 60 MG TABS Take 60 mg by mouth in the morning and at bedtime.   . Travoprost, BAK Free, (TRAVATAN) 0.004 % SOLN ophthalmic solution Place 1 drop into  both eyes at bedtime.   . valsartan (DIOVAN) 80 MG tablet Take 1 tablet by mouth 2 (two) times daily.    No facility-administered encounter medications on file as of 01/21/2021.    Patient Active Problem List   Diagnosis Date Noted  . Deficit in activities of daily living (ADL) 12/19/2020  . AKI (acute kidney injury) ([place])   . Hypervolemia 11/21/2020  . Hyperthyroidism 11/14/2020  . Acute cholecystitis 11/11/2020  . Callus 11/07/2020  . Heart failure with reduced  ejection fraction ([place]) 10/05/2020  . Paroxysmal atrial fibrillation ([place]) 08/30/2020  . Secondary hypercoagulable state ([place]) 08/30/2020  . Diabetes mellitus ([place]) 07/13/2020  . Acute exacerbation of CHF (congestive heart failure) ([place]) 06/20/2020  . Pruritus 06/20/2020  . Status post bilateral below knee amputation ([place]) 04/20/2019  . Chronic cough 08/03/2018  . Status post reverse total shoulder replacement, left   . Neck pain, chronic 10/20/2017  . Cognitive impairment 08/28/2016  . Cardiomyopathy ([place])   . Vitamin D deficiency 06/01/2014  . 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[place]) 03/11/2013  . Preventative health care 02/09/2013  . Below knee amputation status, left 12/20/2012  . CKD stage 4 secondary to hypertension ([place]) 11/04/2012  . Normocytic anemia 11/04/2012  . 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 Chronic diarrhea 08/25/2012  . Complete heart block ([place])   . PACEMAKER-[place] 03/15/2010  . HYPERPARATHYROIDISM, SECONDARY 01/16/2010  . Hyperlipidemia 11/23/2007  . DIABETIC  RETINOPATHY 08/12/2006  . DIABETIC PERIPHERAL NEUROPATHY 08/12/2006  . Essential hypertension 08/12/2006  . PERIPHERAL VASCULAR DISEASE 08/12/2006    Conditions to be addressed/monitored: NIDDM,  HTN, HLD, CAD, HF, CKD stage 4, PAF, CHB with pacemaker, L BKA, hyperthyroidism  Care Plan : CCM RN - Improve Medication Taking Behavior  Updates made by [name], RN since 01/21/2021 12:00 AM    Problem: Medication Adherence ([facility])     Goal: Medication Adherence Improved   Start Date: 09/19/2020  Expected End Date: 03/28/2021  Recent Progress: Not on track  Priority: High  Note:   Current Barriers:  . Non-adherence to prescribed medication regimen . Does not adhere to prescribed medication regimen- spoke with patient via phone, states she is taking two 20 mg torsemide daily  not 3 twice daily as directed by Dr Mahalia Longest on 4/21. she states her arms and right  leg "look normal" when asked about extremity swelling, she is aware and can correctly state the dates of her upcoming appointments for podiatrist and mammogram   Nurse Case Manager Clinical Goal(s):  Marland Kitchen Over the next 30-60 days, patient will work with home health RN and health care team to improve medication taking behavior  Interventions:  . 1:1 collaboration with [name] Payment, MD regarding development and update of comprehensive plan of care as evidenced by provider attestation and co-signature . Inter-disciplinary care team collaboration (see longitudinal plan of care) . Reminded patient to bring medications and/or list of current medications she is taking with her to all provider appointments . Reviewed with patient Dr Bensimhon's directions during HF clinic visit on 4/21 regarding torsemide and limiting fluids  Patient Goals/Self-Care Activities Over the next 30-60 days, patient will:  - Patient will self administer medications as prescribed Patient will attend all scheduled provider appointments Patient will call pharmacy for medication refills Meet with pharmacist to address medication taking barriers  Follow Up Plan: The care management team will reach out to the patient again over the next 30-60 days.  [name] RN, CCM, [facility] RN Care Manager [phone]

## 2021-01-21 NOTE — Patient Instructions (Signed)
Visit Information It was nice speaking with you today. Goals Addressed            This Visit's Progress   . Track and Manage Symptoms-Heart Failure       Timeframe:  Long-Range Goal Priority:  High Start Date:     12/18/20                        Expected End Date:      ongoing                 Follow Up Date 02/22/21   - know when to call the doctor - track symptoms and what helps feel better or worse    Why is this important?    You will be able to handle your symptoms better if you keep track of them.   Making some simple changes to your lifestyle will help.   Eating healthy is one thing you can do to take good care of yourself.    Notes:        The patient verbalized understanding of instructions, educational materials, and care plan provided today and declined offer to receive copy of patient instructions, educational materials, and care plan.   The care management team will reach out to the patient again over the next 30-60 days.   [name] RN, CCM, [facility] RN Care Manager [phone]

## 2021-01-22 ENCOUNTER — Other Ambulatory Visit: Payer: Self-pay | Admitting: Internal Medicine

## 2021-01-22 DIAGNOSIS — K529 Noninfective gastroenteritis and colitis, unspecified: Secondary | ICD-10-CM

## 2021-01-22 MED ORDER — LOPERAMIDE HCL 2 MG PO CAPS: ORAL_CAPSULE | ORAL | 0 refills | Status: DC

## 2021-01-24 ENCOUNTER — Other Ambulatory Visit: Payer: Self-pay | Admitting: Student

## 2021-01-24 DIAGNOSIS — L299 Pruritus, unspecified: Secondary | ICD-10-CM

## 2021-01-24 NOTE — Telephone Encounter (Signed)
Pt states she takes one twice daily everyday... #30 only last 2wks.  Will send info to appropriate MD to increase qty if appropriate.  Please advise  Despina Hidden Cassady4/28/20221:43 PM

## 2021-01-24 NOTE — Telephone Encounter (Signed)
This appears to be a duplicate. The medication was refilled 1 week ago.

## 2021-01-29 ENCOUNTER — Encounter: Payer: Self-pay | Admitting: Dietician

## 2021-01-29 ENCOUNTER — Other Ambulatory Visit: Payer: Self-pay | Admitting: Student

## 2021-01-29 ENCOUNTER — Other Ambulatory Visit: Payer: Self-pay | Admitting: Internal Medicine

## 2021-01-29 ENCOUNTER — Telehealth: Payer: Self-pay | Admitting: Dietician

## 2021-01-29 DIAGNOSIS — K529 Noninfective gastroenteritis and colitis, unspecified: Secondary | ICD-10-CM

## 2021-01-29 DIAGNOSIS — I10 Essential (primary) hypertension: Secondary | ICD-10-CM

## 2021-01-29 NOTE — Telephone Encounter (Signed)
[name] [name] reports she sees Dr. Katy Fitch and has been there int he past year.  requested report for 10/29/20.

## 2021-01-30 ENCOUNTER — Other Ambulatory Visit: Payer: Self-pay

## 2021-01-30 ENCOUNTER — Encounter: Payer: Self-pay | Admitting: Internal Medicine

## 2021-01-30 ENCOUNTER — Ambulatory Visit (INDEPENDENT_AMBULATORY_CARE_PROVIDER_SITE_OTHER): Payer: Medicare Other | Admitting: Internal Medicine

## 2021-01-30 VITALS — BP 155/85 | HR 69 | Ht 62.0 in | Wt 144.2 lb

## 2021-01-30 DIAGNOSIS — I480 Paroxysmal atrial fibrillation: Secondary | ICD-10-CM

## 2021-01-30 DIAGNOSIS — I429 Cardiomyopathy, unspecified: Secondary | ICD-10-CM | POA: Diagnosis not present

## 2021-01-30 DIAGNOSIS — I2510 Atherosclerotic heart disease of native coronary artery without angina pectoris: Secondary | ICD-10-CM

## 2021-01-30 DIAGNOSIS — I442 Atrioventricular block, complete: Secondary | ICD-10-CM

## 2021-01-30 DIAGNOSIS — I739 Peripheral vascular disease, unspecified: Secondary | ICD-10-CM

## 2021-01-30 DIAGNOSIS — Z950 Presence of cardiac pacemaker: Secondary | ICD-10-CM | POA: Diagnosis not present

## 2021-01-30 DIAGNOSIS — R600 Localized edema: Secondary | ICD-10-CM
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
## 2021-01-30 NOTE — Telephone Encounter (Signed)
This patient was prescribed loperamide 1 week ago, so will not refill at this time.

## 2021-01-30 NOTE — Patient Instructions (Addendum)
Medication Instructions:  Your physician recommends that you continue on your current medications as directed. Please refer to the Current Medication list given to you today.  Labwork: None ordered.  Testing/Procedures: None ordered.  Follow-Up: Your physician wants you to follow-up in: one year with [name], MD or one of the following Advanced Practice Providers on your designated Care Team:    Chanetta Marshall, NP  Tommye Standard, PA-C  Legrand Como "Jonni Sanger" [place], [place]  Remote monitoring is used to monitor your Pacemaker from home. This monitoring reduces the number of office visits required to check your device to one time per year. It allows [place] to keep an eye on the functioning of your device to ensure it is working properly. You are scheduled for a device check from home on 03/21/2021. You may send your transmission at any time that day. If you have a wireless device, the transmission will be sent automatically. After your physician reviews your transmission, you will receive a postcard with your next transmission date.  Any Other Special Instructions Will Be Listed Below (If Applicable).  If you need a refill on your cardiac medications before your next appointment, please call your pharmacy.

## 2021-02-01 ENCOUNTER — Inpatient Hospital Stay (HOSPITAL_COMMUNITY)
Admission: EM | Admit: 2021-02-01 | Discharge: 2021-02-08 | DRG: 291 | Disposition: A | Discharge status: Home Health | Payer: Medicare Other | Attending: Internal Medicine | Admitting: Internal Medicine

## 2021-02-01 ENCOUNTER — Encounter (HOSPITAL_COMMUNITY): Payer: Self-pay | Admitting: Emergency Medicine

## 2021-02-01 ENCOUNTER — Emergency Department (HOSPITAL_COMMUNITY): Payer: Medicare Other

## 2021-02-01 DIAGNOSIS — Z90710 Acquired absence of both cervix and uterus: Secondary | ICD-10-CM

## 2021-02-01 DIAGNOSIS — I5023 Acute on chronic systolic (congestive) heart failure: Secondary | ICD-10-CM | POA: Diagnosis not present

## 2021-02-01 DIAGNOSIS — D539 Nutritional anemia, unspecified: Secondary | ICD-10-CM | POA: Diagnosis present

## 2021-02-01 DIAGNOSIS — Z833 Family history of diabetes mellitus: Secondary | ICD-10-CM | POA: Diagnosis not present

## 2021-02-01 DIAGNOSIS — Z90722 Acquired absence of ovaries, bilateral: Secondary | ICD-10-CM

## 2021-02-01 DIAGNOSIS — E872 Acidosis: Secondary | ICD-10-CM | POA: Diagnosis present

## 2021-02-01 DIAGNOSIS — D509 Iron deficiency anemia, unspecified: Secondary | ICD-10-CM | POA: Diagnosis present

## 2021-02-01 DIAGNOSIS — I129 Hypertensive chronic kidney disease with stage 1 through stage 4 chronic kidney disease, or unspecified chronic kidney disease: Secondary | ICD-10-CM | POA: Diagnosis present

## 2021-02-01 DIAGNOSIS — Z20822 Contact with and (suspected) exposure to covid-19: Secondary | ICD-10-CM | POA: Diagnosis not present

## 2021-02-01 DIAGNOSIS — R601 Generalized edema: Secondary | ICD-10-CM

## 2021-02-01 DIAGNOSIS — I5043 Acute on chronic combined systolic (congestive) and diastolic (congestive) heart failure: Secondary | ICD-10-CM | POA: Diagnosis not present

## 2021-02-01 DIAGNOSIS — I482 Chronic atrial fibrillation, unspecified: Secondary | ICD-10-CM | POA: Diagnosis present

## 2021-02-01 DIAGNOSIS — M7989 Other specified soft tissue disorders: Secondary | ICD-10-CM | POA: Diagnosis present

## 2021-02-01 DIAGNOSIS — Z87442 Personal history of urinary calculi: Secondary | ICD-10-CM

## 2021-02-01 DIAGNOSIS — D696 Thrombocytopenia, unspecified: Secondary | ICD-10-CM | POA: Diagnosis present

## 2021-02-01 DIAGNOSIS — I2510 Atherosclerotic heart disease of native coronary artery without angina pectoris: Secondary | ICD-10-CM | POA: Diagnosis present

## 2021-02-01 DIAGNOSIS — I130 Hypertensive heart and chronic kidney disease with heart failure and stage 1 through stage 4 chronic kidney disease, or unspecified chronic kidney disease: Secondary | ICD-10-CM | POA: Diagnosis not present

## 2021-02-01 DIAGNOSIS — I509 Heart failure, unspecified: Secondary | ICD-10-CM

## 2021-02-01 DIAGNOSIS — Z89512 Acquired absence of left leg below knee: Secondary | ICD-10-CM

## 2021-02-01 DIAGNOSIS — I480 Paroxysmal atrial fibrillation: Secondary | ICD-10-CM | POA: Diagnosis present

## 2021-02-01 DIAGNOSIS — R680 Hypothermia, not associated with low environmental temperature: Secondary | ICD-10-CM | POA: Diagnosis not present

## 2021-02-01 DIAGNOSIS — E7800 Pure hypercholesterolemia, unspecified: Secondary | ICD-10-CM | POA: Diagnosis present

## 2021-02-01 DIAGNOSIS — R609 Edema, unspecified: Secondary | ICD-10-CM | POA: Diagnosis not present

## 2021-02-01 DIAGNOSIS — E1165 Type 2 diabetes mellitus with hyperglycemia: Secondary | ICD-10-CM | POA: Diagnosis present

## 2021-02-01 DIAGNOSIS — D6869 Other thrombophilia: Secondary | ICD-10-CM | POA: Diagnosis not present

## 2021-02-01 DIAGNOSIS — N184 Chronic kidney disease, stage 4 (severe): Secondary | ICD-10-CM | POA: Diagnosis present

## 2021-02-01 DIAGNOSIS — E1122 Type 2 diabetes mellitus with diabetic chronic kidney disease: Secondary | ICD-10-CM | POA: Diagnosis present

## 2021-02-01 DIAGNOSIS — I5020 Unspecified systolic (congestive) heart failure: Secondary | ICD-10-CM | POA: Diagnosis present

## 2021-02-01 DIAGNOSIS — J810 Acute pulmonary edema: Secondary | ICD-10-CM

## 2021-02-01 DIAGNOSIS — R0902 Hypoxemia: Secondary | ICD-10-CM | POA: Diagnosis present

## 2021-02-01 DIAGNOSIS — Z8249 Family history of ischemic heart disease and other diseases of the circulatory system: Secondary | ICD-10-CM | POA: Diagnosis not present

## 2021-02-01 DIAGNOSIS — F17210 Nicotine dependence, cigarettes, uncomplicated: Secondary | ICD-10-CM | POA: Diagnosis not present

## 2021-02-01 DIAGNOSIS — Z8674 Personal history of sudden cardiac arrest: Secondary | ICD-10-CM

## 2021-02-01 DIAGNOSIS — Z79899 Other long term (current) drug therapy: Secondary | ICD-10-CM

## 2021-02-01 DIAGNOSIS — Z950 Presence of cardiac pacemaker: Secondary | ICD-10-CM

## 2021-02-01 DIAGNOSIS — Z823 Family history of stroke: Secondary | ICD-10-CM

## 2021-02-01 DIAGNOSIS — I739 Peripheral vascular disease, unspecified: Secondary | ICD-10-CM | POA: Diagnosis present

## 2021-02-01 DIAGNOSIS — E1151 Type 2 diabetes mellitus with diabetic peripheral angiopathy without gangrene: Secondary | ICD-10-CM | POA: Diagnosis present

## 2021-02-01 DIAGNOSIS — M79602 Pain in left arm: Secondary | ICD-10-CM | POA: Diagnosis not present

## 2021-02-01 DIAGNOSIS — E1140 Type 2 diabetes mellitus with diabetic neuropathy, unspecified: Secondary | ICD-10-CM | POA: Diagnosis present

## 2021-02-01 DIAGNOSIS — R0989 Other specified symptoms and signs involving the circulatory and respiratory systems: Secondary | ICD-10-CM | POA: Diagnosis not present

## 2021-02-01 LAB — I-STAT ARTERIAL BLOOD GAS, ED
Acid-base deficit: 10 mmol/L — ABNORMAL HIGH (ref 0.0–2.0)
Bicarbonate: 18 mmol/L — ABNORMAL LOW (ref 20.0–28.0)
Calcium, Ion: 1.25 mmol/L (ref 1.15–1.40)
HCT: 40 % (ref 36.0–46.0)
Hemoglobin: 13.6 g/dL (ref 12.0–15.0)
O2 Saturation: 30 %
Patient temperature: 97.9
Potassium: 4.4 mmol/L (ref 3.5–5.1)
Sodium: 144 mmol/L (ref 135–145)
TCO2: 19 mmol/L — ABNORMAL LOW (ref 22–32)
pCO2 arterial: 46.8 mmHg (ref 32.0–48.0)
pH, Arterial: 7.192 — CL (ref 7.350–7.450)
pO2, Arterial: 23 mmHg — CL (ref 83.0–108.0)

## 2021-02-01 LAB — CBC WITH DIFFERENTIAL/PLATELET
Abs Immature Granulocytes: 0.01 10*3/uL (ref 0.00–0.07)
Basophils Absolute: 0 10*3/uL (ref 0.0–0.1)
Basophils Relative: 1 %
Eosinophils Absolute: 0.2 10*3/uL (ref 0.0–0.5)
Eosinophils Relative: 5 %
HCT: 35.9 % — ABNORMAL LOW (ref 36.0–46.0)
Hemoglobin: 11 g/dL — ABNORMAL LOW (ref 12.0–15.0)
Immature Granulocytes: 0 %
Lymphocytes Relative: 19 %
Lymphs Abs: 0.6 10*3/uL — ABNORMAL LOW (ref 0.7–4.0)
MCH: 30.9 pg (ref 26.0–34.0)
MCHC: 30.6 g/dL (ref 30.0–36.0)
MCV: 100.8 fL — ABNORMAL HIGH (ref 80.0–100.0)
Monocytes Absolute: 0.4 10*3/uL (ref 0.1–1.0)
Monocytes Relative: 11 %
Neutro Abs: 2.2 10*3/uL (ref 1.7–7.7)
Neutrophils Relative %: 64 %
Platelets: 92 10*3/uL — ABNORMAL LOW (ref 150–400)
RBC: 3.56 MIL/uL — ABNORMAL LOW (ref 3.87–5.11)
RDW: 17.3 % — ABNORMAL HIGH (ref 11.5–15.5)
WBC: 3.5 10*3/uL — ABNORMAL LOW (ref 4.0–10.5)
nRBC: 0 % (ref 0.0–0.2)

## 2021-02-01 LAB — BASIC METABOLIC PANEL
Anion gap: 6 (ref 5–15)
BUN: 50 mg/dL — ABNORMAL HIGH (ref 8–23)
CO2: 18 mmol/L — ABNORMAL LOW (ref 22–32)
Calcium: 8.4 mg/dL — ABNORMAL LOW (ref 8.9–10.3)
Chloride: 118 mmol/L — ABNORMAL HIGH (ref 98–111)
Creatinine, Ser: 3.04 mg/dL — ABNORMAL HIGH (ref 0.44–1.00)
GFR, Estimated: 16 mL/min — ABNORMAL LOW (ref >60)
Glucose, Bld: 80 mg/dL (ref 70–99)
Potassium: 4.6 mmol/L (ref 3.5–5.1)
Sodium: 142 mmol/L (ref 135–145)

## 2021-02-01 LAB — I-STAT VENOUS BLOOD GAS, ED
Acid-base deficit: 13 mmol/L — ABNORMAL HIGH (ref 0.0–2.0)
Bicarbonate: 13.3 mmol/L — ABNORMAL LOW (ref 20.0–28.0)
Calcium, Ion: 1.07 mmol/L — ABNORMAL LOW (ref 1.15–1.40)
HCT: 39 % (ref 36.0–46.0)
Hemoglobin: 13.3 g/dL (ref 12.0–15.0)
O2 Saturation: 86 %
Potassium: 4.3 mmol/L (ref 3.5–5.1)
Sodium: 143 mmol/L (ref 135–145)
TCO2: 14 mmol/L — ABNORMAL LOW (ref 22–32)
pCO2, Ven: 32 mmHg — ABNORMAL LOW (ref 44.0–60.0)
pH, Ven: 7.227 — ABNORMAL LOW (ref 7.250–7.430)
pO2, Ven: 60 mmHg — ABNORMAL HIGH (ref 32.0–45.0)

## 2021-02-01 LAB — BRAIN NATRIURETIC PEPTIDE: B Natriuretic Peptide: >4500 pg/mL — ABNORMAL HIGH (ref 0.0–100.0)

## 2021-02-01 LAB — RESP PANEL BY RT-PCR (FLU A&B, COVID) ARPGX2
Influenza A by PCR: NEGATIVE
Influenza B by PCR: NEGATIVE
SARS Coronavirus 2 by RT PCR: NEGATIVE

## 2021-02-01 LAB — HEPATIC FUNCTION PANEL
ALT: 18 U/L (ref 0–44)
AST: 24 U/L (ref 15–41)
Albumin: 3.3 g/dL — ABNORMAL LOW (ref 3.5–5.0)
Alkaline Phosphatase: 112 U/L (ref 38–126)
Bilirubin, Direct: 0.4 mg/dL — ABNORMAL HIGH (ref 0.0–0.2)
Indirect Bilirubin: 0.8 mg/dL (ref 0.3–0.9)
Total Bilirubin: 1.2 mg/dL (ref 0.3–1.2)
Total Protein: 6.8 g/dL (ref 6.5–8.1)

## 2021-02-01 LAB — TROPONIN I (HIGH SENSITIVITY)
Troponin I (High Sensitivity): 22 ng/L — ABNORMAL HIGH (ref <18)
Troponin I (High Sensitivity): 23 ng/L — ABNORMAL HIGH (ref <18)

## 2021-02-01 MED ORDER — CALCITRIOL 0.5 MCG PO CAPS
0.5000 ug | ORAL_CAPSULE | Freq: Every day | ORAL | Status: DC
  Administered 2021-02-02 – 2021-02-08 (×7): 0.5 ug via ORAL
  Filled 2021-02-01 (×7): qty 1

## 2021-02-01 MED ORDER — APIXABAN 2.5 MG PO TABS
2.5000 mg | ORAL_TABLET | Freq: Two times a day (BID) | ORAL | Status: DC
  Administered 2021-02-02 – 2021-02-08 (×14): 2.5 mg via ORAL
  Filled 2021-02-01 (×14): qty 1

## 2021-02-01 MED ORDER — SENNOSIDES-DOCUSATE SODIUM 8.6-50 MG PO TABS: 1.0000 | ORAL_TABLET | Freq: Every evening | ORAL | Status: DC | PRN

## 2021-02-01 MED ORDER — ACETAMINOPHEN 325 MG PO TABS: 650.0000 mg | ORAL_TABLET | Freq: Four times a day (QID) | ORAL | Status: DC | PRN

## 2021-02-01 MED ORDER — SEVELAMER CARBONATE 800 MG PO TABS
1600.0000 mg | ORAL_TABLET | Freq: Three times a day (TID) | ORAL | Status: DC
  Administered 2021-02-02 – 2021-02-08 (×20): 1600 mg via ORAL
  Filled 2021-02-01 (×21): qty 2

## 2021-02-01 MED ORDER — EMPAGLIFLOZIN 10 MG PO TABS: 10.0000 mg | ORAL_TABLET | Freq: Every day | ORAL | Status: DC

## 2021-02-01 MED ORDER — ATORVASTATIN CALCIUM 40 MG PO TABS
40.0000 mg | ORAL_TABLET | Freq: Every day | ORAL | Status: DC
  Administered 2021-02-02 – 2021-02-08 (×7): 40 mg via ORAL
  Filled 2021-02-01 (×7): qty 1

## 2021-02-01 MED ORDER — ACETAMINOPHEN 650 MG RE SUPP: 650.0000 mg | Freq: Four times a day (QID) | RECTAL | Status: DC | PRN

## 2021-02-01 MED ORDER — ALBUTEROL SULFATE HFA 108 (90 BASE) MCG/ACT IN AERS
1.0000 | INHALATION_SPRAY | RESPIRATORY_TRACT | Status: DC | PRN
  Filled 2021-02-01: qty 6.7

## 2021-02-01 MED ORDER — LOPERAMIDE HCL 2 MG PO CAPS
2.0000 mg | ORAL_CAPSULE | Freq: Four times a day (QID) | ORAL | Status: DC | PRN
  Administered 2021-02-04 – 2021-02-05 (×4): 2 mg via ORAL
  Filled 2021-02-01 (×4): qty 1

## 2021-02-01 MED ORDER — GABAPENTIN 300 MG PO CAPS
300.0000 mg | ORAL_CAPSULE | Freq: Two times a day (BID) | ORAL | Status: DC
  Administered 2021-02-02 – 2021-02-08 (×14): 300 mg via ORAL
  Filled 2021-02-01 (×14): qty 1

## 2021-02-01 MED ORDER — FUROSEMIDE 10 MG/ML IJ SOLN
60.0000 mg | Freq: Once | INTRAMUSCULAR | Status: AC
  Administered 2021-02-01: 60 mg via INTRAVENOUS
  Filled 2021-02-01: qty 6

## 2021-02-01 NOTE — ED Notes (Signed)
RT @ bedside, placing pt on BIPAP

## 2021-02-01 NOTE — ED Provider Notes (Signed)
Emergency Medicine Provider Triage Evaluation Note  [name] [name] , a 70 y.o. female  was evaluated in triage.  Pt complains of left arm pain and swelling.  Patient reports for the past 3 days she has had swelling and pain primarily in her left arm, she reports that her right arm feels a bit swollen as well.  Reports chronic shortness of breath no worse than usual, no chest pain.  No trauma or injury, no recent IV medications or fluids  Review of Systems  Positive: Arm pain, swelling Negative: Chest pain, shortness of breath  Physical Exam  BP (!) 158/82 (BP Location: Left Arm)   Pulse 70   Temp 97.9 F (36.6 C) (Oral)   Resp (!) 22   SpO2 97%  Gen:   Awake, no distress   Resp:  Normal effort  MSK:   Moves extremities without difficulty, left swollen and tender to palpation from the hand up to just above the elbow, distal pulses intact  Medical Decision Making  Medically screening exam initiated at 12:12 PM.  Appropriate orders placed.  [name] was informed that the remainder of the evaluation will be completed by another provider, this initial triage assessment does not replace that evaluation, and the importance of remaining in the ED until their evaluation is complete.     [name], PA-C 02/01/21 1216    [name], MD 02/01/21 [phone]
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
## 2021-02-01 NOTE — ED Triage Notes (Signed)
Pt here from home with c/o left arm swelling and pain times 2 days ,

## 2021-02-02 ENCOUNTER — Other Ambulatory Visit: Payer: Self-pay

## 2021-02-02 ENCOUNTER — Encounter (HOSPITAL_COMMUNITY): Payer: Medicare Other

## 2021-02-02 DIAGNOSIS — R0902 Hypoxemia: Secondary | ICD-10-CM | POA: Diagnosis not present

## 2021-02-02 DIAGNOSIS — R601 Generalized edema: Secondary | ICD-10-CM | POA: Insufficient documentation

## 2021-02-02 DIAGNOSIS — I480 Paroxysmal atrial fibrillation: Secondary | ICD-10-CM

## 2021-02-02 DIAGNOSIS — I129 Hypertensive chronic kidney disease with stage 1 through stage 4 chronic kidney disease, or unspecified chronic kidney disease: Secondary | ICD-10-CM | POA: Diagnosis not present

## 2021-02-02 DIAGNOSIS — I130 Hypertensive heart and chronic kidney disease with heart failure and stage 1 through stage 4 chronic kidney disease, or unspecified chronic kidney disease: Secondary | ICD-10-CM | POA: Diagnosis not present

## 2021-02-02 DIAGNOSIS — N184 Chronic kidney disease, stage 4 (severe): Secondary | ICD-10-CM

## 2021-02-02 DIAGNOSIS — I5020 Unspecified systolic (congestive) heart failure: Secondary | ICD-10-CM

## 2021-02-02 DIAGNOSIS — D696 Thrombocytopenia, unspecified: Secondary | ICD-10-CM

## 2021-02-02 LAB — CBC WITH DIFFERENTIAL/PLATELET
Abs Immature Granulocytes: 0.01 10*3/uL (ref 0.00–0.07)
Basophils Absolute: 0 10*3/uL (ref 0.0–0.1)
Basophils Relative: 1 %
Eosinophils Absolute: 0.3 10*3/uL (ref 0.0–0.5)
Eosinophils Relative: 7 %
HCT: 35.2 % — ABNORMAL LOW (ref 36.0–46.0)
Hemoglobin: 10.9 g/dL — ABNORMAL LOW (ref 12.0–15.0)
Immature Granulocytes: 0 %
Lymphocytes Relative: 19 %
Lymphs Abs: 0.8 10*3/uL (ref 0.7–4.0)
MCH: 30.9 pg (ref 26.0–34.0)
MCHC: 31 g/dL (ref 30.0–36.0)
MCV: 99.7 fL (ref 80.0–100.0)
Monocytes Absolute: 0.6 10*3/uL (ref 0.1–1.0)
Monocytes Relative: 14 %
Neutro Abs: 2.4 10*3/uL (ref 1.7–7.7)
Neutrophils Relative %: 59 %
Platelets: 93 10*3/uL — ABNORMAL LOW (ref 150–400)
RBC: 3.53 MIL/uL — ABNORMAL LOW (ref 3.87–5.11)
RDW: 17.2 % — ABNORMAL HIGH (ref 11.5–15.5)
WBC: 4.2 10*3/uL (ref 4.0–10.5)
nRBC: 0 % (ref 0.0–0.2)

## 2021-02-02 LAB — COMPREHENSIVE METABOLIC PANEL
ALT: 20 U/L (ref 0–44)
AST: 21 U/L (ref 15–41)
Albumin: 3.1 g/dL — ABNORMAL LOW (ref 3.5–5.0)
Alkaline Phosphatase: 103 U/L (ref 38–126)
Anion gap: 6 (ref 5–15)
BUN: 48 mg/dL — ABNORMAL HIGH (ref 8–23)
CO2: 18 mmol/L — ABNORMAL LOW (ref 22–32)
Calcium: 8.5 mg/dL — ABNORMAL LOW (ref 8.9–10.3)
Chloride: 117 mmol/L — ABNORMAL HIGH (ref 98–111)
Creatinine, Ser: 2.87 mg/dL — ABNORMAL HIGH (ref 0.44–1.00)
GFR, Estimated: 17 mL/min — ABNORMAL LOW (ref >60)
Glucose, Bld: 86 mg/dL (ref 70–99)
Potassium: 4.7 mmol/L (ref 3.5–5.1)
Sodium: 141 mmol/L (ref 135–145)
Total Bilirubin: 1.2 mg/dL (ref 0.3–1.2)
Total Protein: 6.4 g/dL — ABNORMAL LOW (ref 6.5–8.1)

## 2021-02-02 LAB — PROTIME-INR
INR: 1.4 — ABNORMAL HIGH (ref 0.8–1.2)
Prothrombin Time: 16.9 seconds — ABNORMAL HIGH (ref 11.4–15.2)

## 2021-02-02 MED ORDER — SODIUM BICARBONATE 650 MG PO TABS
1300.0000 mg | ORAL_TABLET | Freq: Two times a day (BID) | ORAL | Status: DC
  Administered 2021-02-02 – 2021-02-07 (×11): 1300 mg via ORAL
  Filled 2021-02-02 (×11): qty 2

## 2021-02-02 MED ORDER — FUROSEMIDE 10 MG/ML IJ SOLN
60.0000 mg | Freq: Once | INTRAMUSCULAR | Status: AC
  Administered 2021-02-02: 60 mg via INTRAVENOUS
  Filled 2021-02-02: qty 6

## 2021-02-02 MED ORDER — FUROSEMIDE 10 MG/ML IJ SOLN
60.0000 mg | Freq: Two times a day (BID) | INTRAMUSCULAR | Status: DC
  Administered 2021-02-02 – 2021-02-06 (×9): 60 mg via INTRAVENOUS
  Filled 2021-02-02 (×9): qty 6

## 2021-02-02 MED ORDER — IRBESARTAN 75 MG PO TABS
75.0000 mg | ORAL_TABLET | Freq: Every day | ORAL | Status: DC
  Administered 2021-02-02: 75 mg via ORAL
  Filled 2021-02-02: qty 1

## 2021-02-02 NOTE — Plan of Care (Signed)
  Problem: Activity: Goal: Capacity to carry out activities will improve Outcome: Progressing   Problem: Cardiac: Goal: Ability to achieve and maintain adequate cardiopulmonary perfusion will improve Outcome: Progressing   

## 2021-02-02 NOTE — ED Notes (Signed)
Report called. VSS @ this time. RN denied questions or concerns regarding report.

## 2021-02-02 NOTE — Progress Notes (Addendum)
     Subjective: feeling a little better, but "I still have a lot of fluid", no chest pain, mild abd pain, no N/V  Objective:  Vital signs in last 24 hours: Vitals:   02/02/21 0215 02/02/21 0230 02/02/21 0305 02/02/21 0312  BP: (!) 174/89 (!) 141/78 (!) 152/86 (!) 152/82  Pulse: 70 70 69 70  Resp: _0 Temp:  97.7 F (36.5 C)  (!) 97.4 F (36.3 C)  TempSrc:  Oral  Oral  SpO2: 96% 95%  99%  Weight:    65.9 kg  General: resting in bed HEENT: no scleral icterus Cardiac: RRR, no rubs, murmurs or gallops Pulm: bibasilar crackles Abd: soft, mild RUQ tenderness, mild abd wall edema, BS present Ext: warm and well perfused, 3+ bilateral pedal edema, Bilateral UE edema mildly asymetric Neuro: alert and oriented X4  Assessment/Plan:   Acute exacerbation of HFrEF (congestive heart failure) (HCC) - Suspected due to suboptimal medication adherence (taking furosemide instead of perscribed torsemide) - Continue IV lasix 69m BID -Daily weights & I&O - I would hold jardiance at this time with eGFR <20. -Hold / discontinue valsartan due to renal dysfunction  Left arm swelling - eval for DVT ordered, patient on A/C renally dosed for PAF    PERIPHERAL VASCULAR DISEASE - Poor pulse in RLE -ABI of RLE ordered    Thrombocytopenia (HCC) -monitor    CKD stage 4 secondary to hypertension ([place] with NAGMA -d/c valsartan - d/c jardiance (at least for now) - start sodium bicarb [id number] for metabolic acidosis -calcitriol daily -sevelamer for phosphate binding    Paroxysmal atrial fibrillation (HCC)   Secondary hypercoagulable state ([facility] - continue apixaban   Dispo: Anticipated discharge in approximately 3-5 day(s).   HLucious Groves DO 02/02/2021, 7:53 AM Pager: [phone]

## 2021-02-02 NOTE — ED Notes (Signed)
Report attempted told pt had not been assigned to anyone will call RN  back

## 2021-02-03 ENCOUNTER — Inpatient Hospital Stay (HOSPITAL_COMMUNITY): Payer: Medicare Other

## 2021-02-03 ENCOUNTER — Encounter: Payer: Self-pay | Admitting: *Deleted

## 2021-02-03 DIAGNOSIS — M7989 Other specified soft tissue disorders: Secondary | ICD-10-CM | POA: Diagnosis not present

## 2021-02-03 DIAGNOSIS — I5023 Acute on chronic systolic (congestive) heart failure: Secondary | ICD-10-CM | POA: Diagnosis not present

## 2021-02-03 DIAGNOSIS — M79602 Pain in left arm: Secondary | ICD-10-CM

## 2021-02-03 DIAGNOSIS — R0989 Other specified symptoms and signs involving the circulatory and respiratory systems: Secondary | ICD-10-CM | POA: Diagnosis not present

## 2021-02-03 DIAGNOSIS — R0902 Hypoxemia: Secondary | ICD-10-CM | POA: Diagnosis not present

## 2021-02-03 DIAGNOSIS — R609 Edema, unspecified: Secondary | ICD-10-CM | POA: Diagnosis not present

## 2021-02-03 DIAGNOSIS — I130 Hypertensive heart and chronic kidney disease with heart failure and stage 1 through stage 4 chronic kidney disease, or unspecified chronic kidney disease: Secondary | ICD-10-CM | POA: Diagnosis not present

## 2021-02-03 LAB — RENAL FUNCTION PANEL
Albumin: 2.7 g/dL — ABNORMAL LOW (ref 3.5–5.0)
Anion gap: 4 — ABNORMAL LOW (ref 5–15)
BUN: 50 mg/dL — ABNORMAL HIGH (ref 8–23)
CO2: 21 mmol/L — ABNORMAL LOW (ref 22–32)
Calcium: 8.4 mg/dL — ABNORMAL LOW (ref 8.9–10.3)
Chloride: 114 mmol/L — ABNORMAL HIGH (ref 98–111)
Creatinine, Ser: 2.86 mg/dL — ABNORMAL HIGH (ref 0.44–1.00)
GFR, Estimated: 17 mL/min — ABNORMAL LOW (ref >60)
Glucose, Bld: 101 mg/dL — ABNORMAL HIGH (ref 70–99)
Phosphorus: 4.1 mg/dL (ref 2.5–4.6)
Potassium: 4.3 mmol/L (ref 3.5–5.1)
Sodium: 139 mmol/L (ref 135–145)

## 2021-02-03 LAB — IRON AND TIBC
Iron: 44 ug/dL (ref 28–170)
Saturation Ratios: 19 % (ref 10.4–31.8)
TIBC: 228 ug/dL — ABNORMAL LOW (ref 250–450)
UIBC: 184 ug/dL

## 2021-02-03 LAB — FERRITIN: Ferritin: 160 ng/mL (ref 11–307)

## 2021-02-03 MED ORDER — NICOTINE 21 MG/24HR TD PT24
21.0000 mg | MEDICATED_PATCH | Freq: Every day | TRANSDERMAL | Status: DC
  Administered 2021-02-03 – 2021-02-08 (×6): 21 mg via TRANSDERMAL
  Filled 2021-02-03 (×6): qty 1

## 2021-02-03 NOTE — Progress Notes (Addendum)
    Subjective:  Doing okay today. Patient reports she still feels swollen. Sees foot doctor. Denies chest pain. Reports shortness of breath, uses an inhaler at home. States she is not urinating much, urinating once yesterday and once today.   Objective:  Vital signs in last 24 hours: Vitals:   02/02/21 1315 02/02/21 2001 02/03/21 0451 02/03/21 0504  BP: (!) 116/95 (!) 157/84 132/71   Pulse: 70 70 70   Resp: '20 20 16   '$ Temp: (!) 97 F (36.1 C)  97.8 F (36.6 C)   TempSrc: Oral  Oral   SpO2: 100% 100% 100%   Weight:    142 lb 4.8 oz (64.5 kg)   Wt loss since admission Loss of about 3 Lbs.  Net loss since admission = -2230 General: Not in acute distress.  Resting in bed comfortably. HEENT: No icterus. Cardiac: Normal rate and rhythm.  No murmurs, rubs or gallops. Pulm: Bibasilar crackles, wheezing present bilateral.  Abd: Soft, non tender, non-distended. Edema present on the abdominal wall. Ext: Warm and well perfused, 3+ edema present in left lower extremity, 1+ edema in RLE. UE edema asymmetric (left >rt).  Below-knee amputation of left lower extremity. Erythema in Rt LE. Neuro: Alert and oriented x4.  Assessment/Plan:  Acute exacerbation of HFrEF (congestive heart failure) ([place]) Likely due to suboptimal medication adherence (taking furosemide once daily instead of prescribed torsemide BID). Pt states her swelling not getting better and she is not urinating much . Net loss yesterday -2070 and Net loss since admission -2230. Mild SOB. No chest pain.  -Continue IV Lasix 60 mg twice daily. -Daily weights and I's and O's.  -Holding of Jardiance and valsartan due to renal dysfunction(GFR <17)  Left arm swelling Preliminary report of vascular upper extremity venous duplex- Negative for DVT or superficial vein thrombosis.  Peripheral vascular disease -Poor pulse in RLE. - Resting right ankle-brachial index is within normal range. Doppler waveforms suggest evidence of vessel  calcification and/or arterial occlusive disease  Thrombocytopenia (HCC) Stable 92>93 -monitor    CKD stage 4 secondary to hypertension ([place]) with NAGMA -d/c valsartan and jardiance for now.  -Continue sodium bicarb 1300 mg twice daily for metabolic acidosis. -Continue calcitriol daily.  -Sevelamer for phosphate binding.     Paroxysmal atrial fibrillation (HCC)   Secondary hypercoagulable state ([place]) -Continue apixaban.    Dispo: Anticipated discharge in approximately 3-5 day(s).   Armando Reichert, MD 02/03/2021, 7:38 AM Pager: [phone] After 5pm on weekdays and 1pm on weekends: Please call on Call pager [phone]

## 2021-02-03 NOTE — Progress Notes (Signed)

## 2021-02-03 NOTE — Progress Notes (Signed)
Upper extremity venous LT and ABI w/ TBI study completed.   Please see CV Proc for preliminary results.   Darlin Coco, RDMS, RVT and [facility], RDMS, RVT

## 2021-02-03 NOTE — Plan of Care (Signed)
  Problem: Education: Goal: Ability to demonstrate management of disease process will improve Outcome: Progressing Goal: Ability to verbalize understanding of medication therapies will improve Outcome: Progressing   

## 2021-02-03 NOTE — Hospital Course (Addendum)
Patient would like to go home today. Discussed adding extra medication to speed along dialysis so swelling in her leg can go down for prosthesis to fit. Patient voices understanding.

## 2021-02-04 DIAGNOSIS — I5023 Acute on chronic systolic (congestive) heart failure: Secondary | ICD-10-CM | POA: Diagnosis not present

## 2021-02-04 DIAGNOSIS — I739 Peripheral vascular disease, unspecified: Secondary | ICD-10-CM | POA: Diagnosis not present

## 2021-02-04 DIAGNOSIS — N184 Chronic kidney disease, stage 4 (severe): Secondary | ICD-10-CM

## 2021-02-04 DIAGNOSIS — D696 Thrombocytopenia, unspecified: Secondary | ICD-10-CM | POA: Diagnosis not present

## 2021-02-04 DIAGNOSIS — I10 Essential (primary) hypertension: Secondary | ICD-10-CM

## 2021-02-04 DIAGNOSIS — I480 Paroxysmal atrial fibrillation: Secondary | ICD-10-CM

## 2021-02-04 DIAGNOSIS — R0902 Hypoxemia: Secondary | ICD-10-CM | POA: Diagnosis not present

## 2021-02-04 DIAGNOSIS — I130 Hypertensive heart and chronic kidney disease with heart failure and stage 1 through stage 4 chronic kidney disease, or unspecified chronic kidney disease: Secondary | ICD-10-CM | POA: Diagnosis not present

## 2021-02-04 LAB — RENAL FUNCTION PANEL
Albumin: 2.9 g/dL — ABNORMAL LOW (ref 3.5–5.0)
Anion gap: 7 (ref 5–15)
BUN: 53 mg/dL — ABNORMAL HIGH (ref 8–23)
CO2: 23 mmol/L (ref 22–32)
Calcium: 8.8 mg/dL — ABNORMAL LOW (ref 8.9–10.3)
Chloride: 109 mmol/L (ref 98–111)
Creatinine, Ser: 3.02 mg/dL — ABNORMAL HIGH (ref 0.44–1.00)
GFR, Estimated: 16 mL/min — ABNORMAL LOW (ref >60)
Glucose, Bld: 113 mg/dL — ABNORMAL HIGH (ref 70–99)
Phosphorus: 3.8 mg/dL (ref 2.5–4.6)
Potassium: 3.9 mmol/L (ref 3.5–5.1)
Sodium: 139 mmol/L (ref 135–145)

## 2021-02-04 LAB — CBC
HCT: 35.4 % — ABNORMAL LOW (ref 36.0–46.0)
Hemoglobin: 11.5 g/dL — ABNORMAL LOW (ref 12.0–15.0)
MCH: 31.1 pg (ref 26.0–34.0)
MCHC: 32.5 g/dL (ref 30.0–36.0)
MCV: 95.7 fL (ref 80.0–100.0)
Platelets: 82 10*3/uL — ABNORMAL LOW (ref 150–400)
RBC: 3.7 MIL/uL — ABNORMAL LOW (ref 3.87–5.11)
RDW: 16.6 % — ABNORMAL HIGH (ref 11.5–15.5)
WBC: 3.1 10*3/uL — ABNORMAL LOW (ref 4.0–10.5)
nRBC: 0 % (ref 0.0–0.2)

## 2021-02-04 LAB — PATHOLOGIST SMEAR REVIEW

## 2021-02-04 NOTE — Progress Notes (Signed)
Things That May Be Affecting Your Health:  Alcohol  Hearing loss  Pain    Depression x Home Safety  Sexual Health  x Diabetes  Lack of physical activity  Stress   Difficulty with daily activities  Loneliness  Tiredness   Drug use x Medicines x Tobacco use   Falls  Motor Vehicle Safety  Weight   Food choices  Oral Health  Other    YOUR PERSONALIZED HEALTH PLAN : 1. Schedule your next subsequent Medicare Wellness visit in one year 2. Attend all of your regular appointments to address your medical issues 3. Complete the preventative screenings and services   Annual Wellness Visit   Medicare Covered Preventative Screenings and [place] Men and Women Who How Often Need? Date of Last Service Action  Abdominal Aortic Aneurysm Adults with AAA risk factors Once      Alcohol Misuse and Counseling All Adults Screening once a year if no alcohol misuse. Counseling up to 4 face to face sessions.     Bone Density Measurement  Adults at risk for osteoporosis Once every 2 yrs x     Lipid Panel Z13.6 All adults without CV disease Once every 5 yrs       Colorectal Cancer   Stool sample or  Colonoscopy All adults 51 and older   Once every year  Every 10 years        Depression All Adults Once a year  Today   Diabetes Screening Blood glucose, post glucose load, or GTT Z13.1  All adults at risk  Pre-diabetics  Once per year  Twice per year      Diabetes  Self-Management Training All adults Diabetics 10 hrs first year; 2 hours subsequent years. Requires Copay     Glaucoma  Diabetics  Family history of glaucoma  African Americans 68 yrs +  Hispanic Americans 76 yrs + Annually - requires coppay      Hepatitis C Z72.89 or F19.20  High Risk for HCV  Born between 1945 and 1965  Annually  Once      HIV Z11.4 All adults based on risk  Annually btw ages 30 & 71 regardless of risk  Annually > 65 yrs if at increased risk      Lung Cancer Screening  Asymptomatic adults aged 11-77 with 30 pack yr history and current smoker OR quit within the last 15 yrs Annually Must have counseling and shared decision making documentation before first screen      Medical Nutrition Therapy Adults with   Diabetes  Renal disease  Kidney transplant within past 3 yrs 3 hours first year; 2 hours subsequent years     Obesity and Counseling All adults Screening once a year Counseling if BMI 30 or higher  Today   Tobacco Use Counseling Adults who use tobacco  Up to 8 visits in one year x    Vaccines Z23  Hepatitis B  Influenza   Pneumonia  Adults   Once  Once every flu season  Two different vaccines separated by one year     Next Annual Wellness Visit People with Medicare Every year  Today     Services & Screenings Women Who How Often Need  Date of Last Service Action  Mammogram  Z12.31 Women over 29 One baseline ages 11-39. Annually ager 40 yrs+      Pap tests All women Annually if high risk. Every 2 yrs for normal risk women  Screening for cervical cancer with   Pap (Z01.419 nl or Z01.411abnl) &  HPV Z11.51 Women aged 18 to 99 Once every 5 yrs     Screening pelvic and breast exams All women Annually if high risk. Every 2 yrs for normal risk women     Sexually Transmitted Diseases  Chlamydia  Gonorrhea  Syphilis All at risk adults Annually for non pregnant females at increased risk         [place] Men Who How Ofter Need  Date of Last Service Action  Prostate Cancer - DRE & PSA Men over 50 Annually.  DRE might require a copay.        Sexually Transmitted Diseases  Syphilis All at risk adults Annually for men at increased risk      Health Maintenance List Health Maintenance  Topic Date Due  . COVID-19 Vaccine (1) Never done  . URINE MICROALBUMIN  07/03/2016  . DEXA SCAN  Never done  . INFLUENZA VACCINE  04/29/2021  . OPHTHALMOLOGY EXAM  10/29/2021  . COLONOSCOPY (Pts 45-19yr Insurance coverage  will need to be confirmed)  03/26/2022  . MAMMOGRAM  07/05/2022  . TETANUS/TDAP  02/08/2030  . Hepatitis C Screening  Completed  . PNA vac Low Risk Adult  Completed  . HPV VACCINES  Aged Out  . FOOT EXAM  Discontinued  . HEMOGLOBIN A1C  Discontinued  . COLON CANCER SCREENING ANNUAL FOBT  Discontinued

## 2021-02-04 NOTE — Progress Notes (Addendum)
    Subjective:  Doing okay today. Is sleepy this AM. Denies chest pain or shortness of breath.  Pt states her swelling is getting better and se is peeing more. Denies any complaints.   Objective:  Vital signs in last 24 hours: Vitals:   02/03/21 2142 02/04/21 0457 02/04/21 0811 02/04/21 1253  BP: (!) 150/73 (!) 148/71 127/76 129/82  Pulse: 70 73 69 70  Resp: '18 19  18  '$ Temp: (!) 97.3 F (36.3 C) (!) 97.4 F (36.3 C)    TempSrc: Oral Oral    SpO2: 100% 93% 98% 97%  Weight:  63.8 kg     CBC Latest Ref Rng & Units 02/04/2021 02/02/2021 02/01/2021  WBC 4.0 - 10.5 K/uL 3.1(L) 4.2 -  Hemoglobin 12.0 - 15.0 g/dL 11.5(L) 10.9(L) 13.6  Hematocrit 36.0 - 46.0 % 35.4(L) 35.2(L) 40.0  Platelets 150 - 400 K/uL 82(L) 93(L) -   CMP Latest Ref Rng & Units 02/04/2021 02/03/2021 02/02/2021  Glucose 70 - 99 mg/dL 113(H) 101(H) 86  BUN 8 - 23 mg/dL 53(H) 50(H) 48(H)  Creatinine 0.44 - 1.00 mg/dL 3.02(H) 2.86(H) 2.87(H)  Sodium 135 - 145 mmol/L 139 139 141  Potassium 3.5 - 5.1 mmol/L 3.9 4.3 4.7  Chloride 98 - 111 mmol/L 109 114(H) 117(H)  CO2 22 - 32 mmol/L 23 21(L) 18(L)  Calcium 8.9 - 10.3 mg/dL 8.8(L) 8.4(L) 8.5(L)  Total Protein 6.5 - 8.1 g/dL - - 6.4(L)  Total Bilirubin 0.3 - 1.2 mg/dL - - 1.2  Alkaline Phos 38 - 126 U/L - - 103  AST 15 - 41 U/L - - 21  ALT 0 - 44 U/L - - 20   General: Sleeping in bed comfortably, does not look in acute distress. HEENT: No icterus. Cardiac: Normal rate. systolic murmur +nt. Pulm: Mild wheezing present bilateral  Abd: Soft, non tender, non-distended. Edema present on the abdominal wall. Ext: Warm and well perfused, 2+ edema present in left lower extremity, mild edema in RLE, UA edema asymmetrical (left more than right).  Below-knee amputation of left lower extremity.  Erythema in right LE.  Erythema in Rt LE. Assessment/Plan:  Acute exacerbation of HFrEF (congestive heart failure) ([place]) Likely due to suboptimal medication adherence (taking furosemide once daily  instead of prescribed torsemide BID) . Net loss yesterday -[id number] and Net loss since admission -3291. Pt feeling better and swelling has improved since admission.  -Continue IV Lasix 60 mg twice daily. -Daily weights and I's and O's. -Continue to hold Jardiance and valsartan due to renal dysfunction (GFR 16)  Left arm swelling Swelling getting better. Vascular UE ultrasound negative for DVT.  Peripheral vascular disease -Poor pulse in RLE. -Resting right ABI normal range.  Doppler waveforms suggest evidence of vessel calcification and/or arterial occlusive disease.   Thrombocytopenia (HCC) Stable 92>93>82 -monitor    CKD stage 4 secondary to hypertension ([place]) with NAGMA -d/c valsartan and jardiance for now.  -Continue sodium bicarb for metabolic acidosis. -Continue calcitriol and sevelamer.    Paroxysmal atrial fibrillation (HCC)   Secondary hypercoagulable state ([place]) -Continue apixaban.    Dispo: Anticipated discharge in approximately 3-5 day(s).   [name] Reichert, MD 02/04/2021, 2:27 PM Pager: [phone] After 5pm on weekdays and 1pm on weekends: Please call on Call pager [phone]

## 2021-02-05 ENCOUNTER — Other Ambulatory Visit: Payer: Medicare Other

## 2021-02-05 ENCOUNTER — Ambulatory Visit: Payer: Medicare Other | Admitting: Podiatry

## 2021-02-05 DIAGNOSIS — I5023 Acute on chronic systolic (congestive) heart failure: Secondary | ICD-10-CM | POA: Diagnosis not present

## 2021-02-05 DIAGNOSIS — R0902 Hypoxemia: Secondary | ICD-10-CM | POA: Diagnosis not present

## 2021-02-05 DIAGNOSIS — I130 Hypertensive heart and chronic kidney disease with heart failure and stage 1 through stage 4 chronic kidney disease, or unspecified chronic kidney disease: Secondary | ICD-10-CM | POA: Diagnosis not present

## 2021-02-05 LAB — CBC
HCT: 35.3 % — ABNORMAL LOW (ref 36.0–46.0)
Hemoglobin: 11.1 g/dL — ABNORMAL LOW (ref 12.0–15.0)
MCH: 30.3 pg (ref 26.0–34.0)
MCHC: 31.4 g/dL (ref 30.0–36.0)
MCV: 96.4 fL (ref 80.0–100.0)
Platelets: 79 10*3/uL — ABNORMAL LOW (ref 150–400)
RBC: 3.66 MIL/uL — ABNORMAL LOW (ref 3.87–5.11)
RDW: 16.6 % — ABNORMAL HIGH (ref 11.5–15.5)
WBC: 3.1 10*3/uL — ABNORMAL LOW (ref 4.0–10.5)
nRBC: 0 % (ref 0.0–0.2)

## 2021-02-05 LAB — RENAL FUNCTION PANEL
Albumin: 2.7 g/dL — ABNORMAL LOW (ref 3.5–5.0)
Anion gap: 8 (ref 5–15)
BUN: 57 mg/dL — ABNORMAL HIGH (ref 8–23)
CO2: 27 mmol/L (ref 22–32)
Calcium: 9 mg/dL (ref 8.9–10.3)
Chloride: 106 mmol/L (ref 98–111)
Creatinine, Ser: 2.94 mg/dL — ABNORMAL HIGH (ref 0.44–1.00)
GFR, Estimated: 17 mL/min — ABNORMAL LOW (ref >60)
Glucose, Bld: 97 mg/dL (ref 70–99)
Phosphorus: 3.7 mg/dL (ref 2.5–4.6)
Potassium: 4.1 mmol/L (ref 3.5–5.1)
Sodium: 141 mmol/L (ref 135–145)

## 2021-02-05 NOTE — Progress Notes (Signed)
RT note. Pt. Not needing BIPAP at this time, RT will continue to monitor.

## 2021-02-05 NOTE — Progress Notes (Signed)
Pt does not want bed alarm. Pt was educated on high fall risk and why bed alarms are used. Pt continues to refuse and says she'll call when she gets up.

## 2021-02-05 NOTE — Progress Notes (Signed)
Heart Failure Navigator Progress Note  Assessed for Heart & Vascular TOC clinic readiness.  Unfortunately at this time the patient does not meet criteria due to poor renal function, SCr remains near 3.   Navigator available for reassessment of patient.   Pricilla Holm, RN, BSN Heart Failure Nurse Navigator [phone]

## 2021-02-05 NOTE — Progress Notes (Addendum)
    Subjective:   Temp overnight was 93.5 F (Rectal) at 4.48 am. Warm blanket applied. Denied any complains after that. Temp improved to 98 F (Oral) this morning at 7.46. am.  Pt states she noted some chills last night and improved with blankets. Feels left arm swelling improved, leg still swollen. Excited to get her new prosthetic leg. Feels a little short of breath and requesting inhalers. Wishes to go home. Discussed need for further diuresis, she understands and is agreeable. Denies any dysuria.   Objective:  Vital signs in last 24 hours: Vitals:   02/04/21 1932 02/05/21 0121 02/05/21 0409 02/05/21 0500  BP: (!) 152/76  124/76   Pulse: 70  70   Resp: 18  18   Temp:   (!) 93.5 F (34.2 C) (!) 94.3 F (34.6 C)  TempSrc:   Rectal Oral  SpO2: 100%  100%   Weight:  63 kg      General: Sitting in bed comfortably, does not look in acute distress. HEENT: No icterus. Cardiac: Normal rate, systolic murmur present.  Pulm: Mild wheezing but other clear lung sounds. No rales, rhonchi. Abd: Soft, non-distended. Edema present on the abdominal wall. Ext: Warm and well perfused, 2+ edema in Left lower extrimity. No edema in Rt LE. UA edema asymmetrical (L>rt)  which has improved since admission.  Below-knee amputation of left lower extremity.  Erythema in LE. Assessment/Plan:  Acute exacerbation of HFrEF (congestive heart failure) ([place]) Likely due to suboptimal medication adherence (taking furosemide once daily instead of prescribed torsemide BID) . Pt's Edema improving. Net loss yesterday -2790 and Net loss since admission -5841. She lost 6.46 lbs since admission.  -Continue IV Lasix 60 mg twice daily. -Daily weights and I's and O's.  Hypothermia Temp overnight was 93.5 F. Warm blanket applied. Denied any complains after that. Temp stable 98 F. Pt felt some chills. Denies dysuria. Feels better now.  -Monitor Temp. -Will consider sending UA, Chest X ray and cultures if temp drops again.    Left arm swelling Swelling getting better. Vascular UE ultrasound negative for DVT.  Peripheral vascular disease  ABI normal range.  Thrombocytopenia (HCC) Stable 92>93>82>79 -monitor  CKD stage 4 secondary to hypertension ([place]) with NAGMA Metabolic acidosis improving.  Creatinine stable. BUN slightly increased from yesterday from 53 to 57. -valsartan and jardiance d/ced.  -Continue sodium bicarb for metabolic acidosis. -Continue calcitriol and sevelamer. -Monitor RFP  Dispo: Anticipated discharge in approximately 2-3 day(s).   Armando Reichert, MD 02/05/2021, 5:58 AM Pager: [phone] After 5pm on weekdays and 1pm on weekends: Please call on Call pager [phone]

## 2021-02-05 NOTE — Progress Notes (Signed)
   02/05/21 0409  Assess: MEWS Score  Temp (!) 93.5 F (34.2 C)  BP 124/76  Pulse Rate 70  Resp 18  Level of Consciousness Alert  SpO2 100 %  O2 Device Room Air  Assess: MEWS Score  MEWS Temp 2  MEWS Systolic 0  MEWS Pulse 0  MEWS RR 0  MEWS LOC 0  MEWS Score 2  MEWS Score Color Yellow  Assess: if the MEWS score is Yellow or Red  Were vital signs taken at a resting state? Yes  Focused Assessment No change from prior assessment  Early Detection of Sepsis Score *See Row Information* Low  MEWS guidelines implemented *See Row Information* Yes  Treat  MEWS Interventions Escalated (See documentation below);Other (Comment) (applied warm blankets and bear hugger)  Pain Scale 0-10  Pain Score 0  Take Vital Signs  Increase Vital Sign Frequency  Yellow: Q 2hr X 2 then Q 4hr X 2, if remains yellow, continue Q 4hrs  Escalate  MEWS: Escalate Yellow: discuss with charge nurse/RN and consider discussing with provider and RRT  Notify: Charge Nurse/RN  Name of Charge Nurse/RN Notified Jequetta RN  Date Charge Nurse/RN Notified 02/05/21  Time Charge Nurse/RN Notified 0441  Notify: Provider  Provider Name/Title Wynetta Emery MD  Date Provider Notified 02/05/21  Time Provider Notified [phone]  Notification Type Page  Notification Reason Critical result  Document  Progress note created (see row info) Yes  Notified Charge nurse and MD of temp. Applied warm blankets and bear hugger to highest setting. Pt does not complain of anything. Denies chills, sweats, CP.

## 2021-02-06 ENCOUNTER — Ambulatory Visit: Payer: Medicare Other | Admitting: Podiatry

## 2021-02-06 DIAGNOSIS — R0902 Hypoxemia: Secondary | ICD-10-CM | POA: Diagnosis not present

## 2021-02-06 DIAGNOSIS — I5023 Acute on chronic systolic (congestive) heart failure: Secondary | ICD-10-CM | POA: Diagnosis not present

## 2021-02-06 DIAGNOSIS — I130 Hypertensive heart and chronic kidney disease with heart failure and stage 1 through stage 4 chronic kidney disease, or unspecified chronic kidney disease: Secondary | ICD-10-CM | POA: Diagnosis not present

## 2021-02-06 LAB — CBC
HCT: 31.6 % — ABNORMAL LOW (ref 36.0–46.0)
Hemoglobin: 10.3 g/dL — ABNORMAL LOW (ref 12.0–15.0)
MCH: 31 pg (ref 26.0–34.0)
MCHC: 32.6 g/dL (ref 30.0–36.0)
MCV: 95.2 fL (ref 80.0–100.0)
Platelets: 70 10*3/uL — ABNORMAL LOW (ref 150–400)
RBC: 3.32 MIL/uL — ABNORMAL LOW (ref 3.87–5.11)
RDW: 16.2 % — ABNORMAL HIGH (ref 11.5–15.5)
WBC: 3.5 10*3/uL — ABNORMAL LOW (ref 4.0–10.5)
nRBC: 0 % (ref 0.0–0.2)

## 2021-02-06 LAB — RETIC PANEL
Immature Retic Fract: 10.5 % (ref 2.3–15.9)
RBC.: 3.35 MIL/uL — ABNORMAL LOW (ref 3.87–5.11)
Retic Count, Absolute: 33.2 10*3/uL (ref 19.0–186.0)
Retic Ct Pct: 1 % (ref 0.4–3.1)
Reticulocyte Hemoglobin: 28.9 pg (ref >27.9)

## 2021-02-06 LAB — RENAL FUNCTION PANEL
Albumin: 2.7 g/dL — ABNORMAL LOW (ref 3.5–5.0)
Anion gap: 7 (ref 5–15)
BUN: 61 mg/dL — ABNORMAL HIGH (ref 8–23)
CO2: 26 mmol/L (ref 22–32)
Calcium: 8.9 mg/dL (ref 8.9–10.3)
Chloride: 104 mmol/L (ref 98–111)
Creatinine, Ser: 2.88 mg/dL — ABNORMAL HIGH (ref 0.44–1.00)
GFR, Estimated: 17 mL/min — ABNORMAL LOW (ref >60)
Glucose, Bld: 89 mg/dL (ref 70–99)
Phosphorus: 3 mg/dL (ref 2.5–4.6)
Potassium: 4 mmol/L (ref 3.5–5.1)
Sodium: 137 mmol/L (ref 135–145)

## 2021-02-06 LAB — GLUCOSE, CAPILLARY: Glucose-Capillary: 138 mg/dL — ABNORMAL HIGH (ref 70–99)

## 2021-02-06 NOTE — TOC Initial Note (Signed)
Transition of Care [facility]) - Initial/Assessment Note    Patient Details  Name: [name] [name] MRN: [id number] Date of Birth: [birth year]  Transition of Care [facility]) CM/SW Contact:    Sherrilyn Rist Transition of Care Supervisor Phone Number: [phone] 02/06/2021, 11:39 AM  Clinical Narrative:                 Patient recently discharge please see notes on 11/29/20 from [place]  Clinical Narrative:                 NCM spoke with patient at bedside, she lives alone, she has walker, w/chair, prosthetic leg (L), bsc, shower stool. NCM offered choice for  HHPT, HHOT, she states she always deals with [facility].  NCM made referral to [facility].  Awaiting call back from [place].  Per [name] , she can take referral.  Soc will begin 24 to 48 hrs post dc.  She states she she has transportation at discharge. She states she does not have her Medicare card , NCM informed her to call DSS to get them to mail her a new Medicare Card.    02/06/21- Contacted [place] with [facility]; patient is active with them for HHRN/ PT. Talked to patient at the bedside; she has an aide that assist her with her meals, grocery shopping and at discharge, she will pick her up and take her home; Her medications are delivered to her home. TOC will continue to follow for progression of care.       Expected Discharge Plan: [place] Barriers to Discharge: No Barriers Identified   Patient Goals and CMS Choice Patient states their goals for this hospitalization and ongoing recovery are:: to get stronger   Choice offered to / list presented to : NA  Expected Discharge Plan and Services Expected Discharge Plan: [place]   Discharge Planning Services: CM Consult Post Acute Care Choice: Resumption of Svcs/PTA Provider Living arrangements for the past 2 months: Apartment                           HH Arranged: RN,PT [place] Agency: [place] ([place]) Date HH  Agency Contacted: 02/06/21   Representative spoke with at [place]: [place] Arrangements/Services Living arrangements for the past 2 months: [place] with:: Self Patient language and need for interpreter reviewed:: No Do you feel safe going back to the place where you live?: Yes            Criminal Activity/Legal Involvement Pertinent to Current Situation/Hospitalization: No - Comment as needed  Activities of Daily Living      Permission Sought/Granted Permission sought to share information with : Case Manager Permission granted to share information with : Yes, Verbal Permission Granted  Share Information with NAME: [place] granted to share info w AGENCY: [facility]        Emotional Assessment              Admission diagnosis:  Hypoxemia [R09.02] Anasarca [R60.1] Acute pulmonary edema (HCC) [J81.0] Acute exacerbation of CHF (congestive heart failure) ([place]) [I50.9] Patient Active Problem List   Diagnosis Date Noted  . Anasarca   . Deficit in activities of daily living (ADL) 12/19/2020  . AKI (acute kidney injury) ([place])   . Hypervolemia 11/21/2020  . Hyperthyroidism 11/14/2020  . Acute cholecystitis 11/11/2020  . Callus 11/07/2020  . Heart  failure with reduced ejection fraction ([place]) 10/05/2020  . Paroxysmal atrial fibrillation ([place]) 08/30/2020  . Secondary hypercoagulable state ([place]) 08/30/2020  . Diabetes mellitus ([place]) 07/13/2020  . Acute exacerbation of CHF (congestive heart failure) ([place]) 06/20/2020  . Pruritus 06/20/2020  . Hx of BKA, left ([place]) 04/20/2019  . Chronic cough 08/03/2018  . Status post reverse total shoulder replacement, left   . Neck pain, chronic 10/20/2017  . Cognitive impairment 08/28/2016  . Cardiomyopathy ([place])   . Vitamin D deficiency 06/01/2014  . 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 Preventative health care 02/09/2013  . Below knee amputation status, left 12/20/2012  .  Thrombocytopenia ([place]) 11/04/2012  . CKD stage 4 secondary to hypertension ([place]) 11/04/2012  . Normocytic anemia 11/04/2012  . 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 Chronic diarrhea 08/25/2012  . Complete heart block ([place])   . PACEMAKER-[place] 03/15/2010  . HYPERPARATHYROIDISM, SECONDARY 01/16/2010  . Hyperlipidemia 11/23/2007  . DIABETIC  RETINOPATHY 08/12/2006  . DIABETIC PERIPHERAL NEUROPATHY 08/12/2006  . Essential hypertension 08/12/2006  . PERIPHERAL VASCULAR DISEASE 08/12/2006   PCP:  [name], MD Pharmacy:   [place], [place] - [address] [place] [address] Phone: [phone] Fax: [place], [place] [address] [place] [address] Phone: [phone] Fax: [phone]     Social Determinants of Health (SDOH) Interventions    Readmission Risk Interventions Readmission Risk Prevention Plan 11/29/2020  Transportation Screening Complete  PCP or Specialist Appt within 3-5 Days Complete  HRI or [place] Complete  Social Work Consult for [place] Planning/Counseling Complete  Palliative Care Screening Not Applicable  Medication Review Press photographer) Complete  Some recent data might be hidden

## 2021-02-06 NOTE — Progress Notes (Signed)
    Subjective: Patient feeling better.  Patient denies shortness of breath, chest pain.  Patient states that she would like to go home but understand that she has to stay to get more fluid out.   Objective:  Vital signs in last 24 hours: Vitals:   02/05/21 1815 02/05/21 1959 02/06/21 0505 02/06/21 0556  BP:  (!) 122/56 131/64   Pulse:  70 70   Resp:  20 16   Temp: (!) 95.4 F (35.2 C) (!) 97.3 F (36.3 C) 97.7 F (36.5 C)   TempSrc: Rectal Oral Oral   SpO2:  100% 97%   Weight:    63.4 kg   General: Sitting in bed comfortably, not in acute distress. HEENT: No icterus. Cardiac: Normal rate, systolic murmur present. Pulm: Clear lung sounds.  No Rales, rhonchi, wheezing. Abd: Soft, nondistended.  Mild edema noted on abdominal wall. Ext: Warm and well perfused, 2+ edema in left lower and upper extremity noted which has improved since admission.  No edema in right LE, right U UE.  Below-knee amputation of left lower extremity. Erythema and right LE. Assessment/Plan:  Acute exacerbation of HFrEF (congestive heart failure) ([place]) Pt's Edema improving. Net loss yesterday -483 and Net loss since admission -6834. She lost 5.51 lbs since admission. -Continue IV Lasix 60 mg twice daily. -Daily weights and I's and O's.  Left arm swelling Swelling getting better.  Vascular USG ultrasound negative for DVT. Peripheral vascular disease ABI normal range.  Thrombocytopenia ([place]) Does not report any bleeding. 92>93>82>79>70 -Reticulocyte count ordered, immature reticulocyte fraction normal 10.5. -monitor  CKD stage 4 secondary to hypertension ([place]) with NAGMA Metabolic acidosis improving.  Creatinine stable.  BUN slightly increased from yesterday from 57-61.  M -Continue sodium bicarb for metabolic acidosis. -Continue calcitriol and sevelamer. -Monitor RFP  Dispo: Anticipated discharge in approximately 1-2 day(s).   Armando Reichert, MD 02/06/2021, 6:29 AM Pager: [phone] After 5pm  on weekdays and 1pm on weekends: Please call on Call pager [phone]

## 2021-02-06 NOTE — Discharge Instructions (Addendum)
Dear [name] [name],   Thank you for letting us participate in your care! In this section, you will find a brief hospital admission summary of why you were admitted to the hospital, what happened during your admission, your diagnosis/diagnoses, and recommended follow up.   You were admitted because you were experiencing generalized swelling.   Your testing revealed retained fluid.   You were diagnosed with acute exacerbation of congestive heart failure.  You were treated with diuretics.  Your condition improved and you were discharged from the hospital for meeting this goal.    POST-HOSPITAL & CARE INSTRUCTIONS 1. Follow up in the Internal medicine clinic on 02/11/21 at 10.15 am.  2. Take medications as prescribed on the discharge. 3. We held your Valsartan due to poor renal function. Do not start this medication without discussing with your PCP.  4. Please let PCP/Specialists know of any changes in medications that were made.  5. Please see medications section of this packet for any medication changes.   DOCTOR'S APPOINTMENTS & FOLLOW UP Future Appointments  Date Time Provider [place]  02/08/2021 11:30 AM GI-BCG DX DEXA 1 GI-BCGDG GI-BREAST CE  02/11/2021 10:15 AM Virl Axe, MD IMP-IMCR [facility]  02/12/2021  9:30 AM MC-HVSC PA/NP MC-HVSC None  03/20/2021 10:15 AM Cato Mulligan, MD IMP-IMCR [facility]  03/21/2021  7:00 AM CVD-CHURCH DEVICE REMOTES [facility]  06/20/2021  7:00 AM CVD-[facility] DEVICE REMOTES [facility]  09/19/2021  7:00 AM CVD-[facility] DEVICE REMOTES [facility]  12/19/2021  7:00 AM CVD-[facility] DEVICE REMOTES [facility]  03/20/2022  7:00 AM CVD-[facility] DEVICE REMOTES [facility]  06/19/2022  7:00 AM CVD-[facility] DEVICE REMOTES [facility]  09/18/2022  7:00 AM CVD-[facility] DEVICE REMOTES [facility]     Thank you for choosing [facility]! Take care and be  well!  Internal [facility]  [place], [place] [address] [phone]       Information on my medicine - ELIQUIS (apixaban)  This medication education was reviewed with me or my healthcare representative as part of my discharge preparation.  Why was Eliquis prescribed for you? Eliquis was prescribed for you to reduce the risk of a blood clot forming that can cause a stroke if you have a medical condition called atrial fibrillation (a type of irregular heartbeat).  What do You need to know about Eliquis ? Take your Eliquis TWICE DAILY - one tablet in the morning and one tablet in the evening with or without food. If you have difficulty swallowing the tablet whole please discuss with your pharmacist how to take the medication safely.  Take Eliquis exactly as prescribed by your doctor and DO NOT stop taking Eliquis without talking to the doctor who prescribed the medication.  Stopping may increase your risk of developing a stroke.  Refill your prescription before you run out.  After discharge, you should have regular check-up appointments with your healthcare provider that is prescribing your Eliquis.  In the future your dose may need to be changed if your kidney function or weight changes by a significant amount or as you get older.  What do you do if you miss a dose? If you miss a dose, take it as soon as you remember on the same day and resume taking twice daily.  Do not take more than one dose of ELIQUIS at the same time to make up a missed dose.  Important Safety Information A  possible side effect of Eliquis is bleeding. You should call your healthcare provider right away if you experience any of the following: ? Bleeding from an injury or your nose that does not stop. ? Unusual colored urine (red or dark brown) or unusual colored stools (red or black). ? Unusual bruising for unknown  reasons. ? A serious fall or if you hit your head (even if there is no bleeding).  Some medicines may interact with Eliquis and might increase your risk of bleeding or clotting while on Eliquis. To help avoid this, consult your healthcare provider or pharmacist prior to using any new prescription or non-prescription medications, including herbals, vitamins, non-steroidal anti-inflammatory drugs (NSAIDs) and supplements.  This website has more information on Eliquis (apixaban): http://www.eliquis.com/eliquis/home

## 2021-02-06 NOTE — Plan of Care (Signed)
  Problem: Activity: Goal: Capacity to carry out activities will improve Outcome: Progressing   Problem: Activity: Goal: Risk for activity intolerance will decrease Outcome: Progressing   Problem: Elimination: Goal: Will not experience complications related to bowel motility Outcome: Progressing

## 2021-02-07 ENCOUNTER — Ambulatory Visit: Payer: Medicare Other | Admitting: Internal Medicine

## 2021-02-07 ENCOUNTER — Other Ambulatory Visit: Payer: Self-pay | Admitting: Internal Medicine

## 2021-02-07 DIAGNOSIS — R0902 Hypoxemia: Secondary | ICD-10-CM | POA: Diagnosis not present

## 2021-02-07 DIAGNOSIS — I5023 Acute on chronic systolic (congestive) heart failure: Secondary | ICD-10-CM | POA: Diagnosis not present

## 2021-02-07 DIAGNOSIS — K529 Noninfective gastroenteritis and colitis, unspecified: Secondary | ICD-10-CM

## 2021-02-07 DIAGNOSIS — I130 Hypertensive heart and chronic kidney disease with heart failure and stage 1 through stage 4 chronic kidney disease, or unspecified chronic kidney disease: Secondary | ICD-10-CM | POA: Diagnosis not present

## 2021-02-07 LAB — RENAL FUNCTION PANEL
Albumin: 2.8 g/dL — ABNORMAL LOW (ref 3.5–5.0)
Anion gap: 7 (ref 5–15)
BUN: 62 mg/dL — ABNORMAL HIGH (ref 8–23)
CO2: 26 mmol/L (ref 22–32)
Calcium: 9 mg/dL (ref 8.9–10.3)
Chloride: 103 mmol/L (ref 98–111)
Creatinine, Ser: 2.94 mg/dL — ABNORMAL HIGH (ref 0.44–1.00)
GFR, Estimated: 17 mL/min — ABNORMAL LOW (ref >60)
Glucose, Bld: 102 mg/dL — ABNORMAL HIGH (ref 70–99)
Phosphorus: 2.9 mg/dL (ref 2.5–4.6)
Potassium: 4.1 mmol/L (ref 3.5–5.1)
Sodium: 136 mmol/L (ref 135–145)

## 2021-02-07 LAB — CBC
HCT: 31.7 % — ABNORMAL LOW (ref 36.0–46.0)
Hemoglobin: 10.1 g/dL — ABNORMAL LOW (ref 12.0–15.0)
MCH: 30.8 pg (ref 26.0–34.0)
MCHC: 31.9 g/dL (ref 30.0–36.0)
MCV: 96.6 fL (ref 80.0–100.0)
Platelets: 66 10*3/uL — ABNORMAL LOW (ref 150–400)
RBC: 3.28 MIL/uL — ABNORMAL LOW (ref 3.87–5.11)
RDW: 16.3 % — ABNORMAL HIGH (ref 11.5–15.5)
WBC: 3 10*3/uL — ABNORMAL LOW (ref 4.0–10.5)
nRBC: 0 % (ref 0.0–0.2)

## 2021-02-07 MED ORDER — FUROSEMIDE 10 MG/ML IJ SOLN
80.0000 mg | Freq: Two times a day (BID) | INTRAMUSCULAR | Status: DC
  Administered 2021-02-07 – 2021-02-08 (×3): 80 mg via INTRAVENOUS
  Filled 2021-02-07 (×3): qty 8

## 2021-02-07 MED ORDER — METOLAZONE 2.5 MG PO TABS
2.5000 mg | ORAL_TABLET | Freq: Every day | ORAL | Status: DC
  Administered 2021-02-07: 2.5 mg via ORAL
  Filled 2021-02-07: qty 1

## 2021-02-07 NOTE — Progress Notes (Signed)
    Subjective: Patient would like to go home today. Discussed adding extra medication to speed along dialysis so swelling in her leg can go down for prosthesis to fit. Patient voices understanding.  Objective:  Vital signs in last 24 hours: Vitals:   02/06/21 1251 02/06/21 1654 02/06/21 2118 02/07/21 0437  BP: 123/70 138/75 120/77 129/81  Pulse: 70 70 70 70  Resp: '16  18 16  '$ Temp: 97.6 F (36.4 C)  (!) 97.3 F (36.3 C)   TempSrc: Oral  Axillary   SpO2: 100%  98% 100%  Weight:    64.3 kg     General: Sitting in bed comfortably, not in acute distress. HEENT: No icterus. Cardiac: Normal rate with systolic murmur present.   Pulm: Clear lung sounds.  No Rales/rhonchi/wheezes Abd: Soft nondistended.  Mild edema present in the abdominal wall. Ext: Warm and well perfused, edema and left lower and upper extremity which has improved since admission.  No edema in right LE, right UE.  Below-knee amputation of left lower extremity. Assessment/Plan:  Acute exacerbation of HFrEF (congestive heart failure) ([place]) Patient's edema same as yesterday improving very slowly.  Net loss yesterday --2413 and Net loss since admission -9247. She lost 3.52 lbs since admission. Gained about 1.99 Lb since yesterday. Lasix increased  to 80 mg BID - Continue Iv lasix 80 mg BID. -Add Metolazone 2.5 mg Daily.  -Daily weights and I's and O's.  Thrombocytopenia ([place]) Does not report any bleeding. 92>93>82>79>70>66 -monitor  CKD stage 4 secondary to hypertension (HCC) NAGMA (Resolved) Metabolic acidosis reolved. Renal function stable.  Bicarb stable.  -Stop sodium bicarb.  -Continue calcitriol and sevelamer. -Monitor RFP  Dispo: Anticipated discharge in approximately 1-2 day(s).   Armando Reichert, MD 02/07/2021, 6:17 AM Pager: [phone] After 5pm on weekdays and 1pm on weekends: Please call on Call pager [phone]

## 2021-02-07 NOTE — TOC Benefit Eligibility Note (Signed)
Transition of Care [facility]) Benefit Eligibility Note    Patient Details  Name: [name] MRN: [id number] Date of Birth: [birth year]   Medication/Dose: [name]  2.5 MG BID   and   ELIQUIS  5 MG BID        Prescription Coverage Preferred Pharmacy: [facility]  , [facility]  and  [place] TRANSITIONS OF CARE [facility]     Co-Pay: $4.00        Additional Notes: PRIMARY INS: MEDICAID [place]  : EFF-DATE 08-29-2020   CO-PAY- $4.00 FOR EACH PRESCRIPTION    Memory Argue Phone Number: 02/07/2021, 4:39 PM

## 2021-02-08 ENCOUNTER — Other Ambulatory Visit (HOSPITAL_COMMUNITY): Payer: Self-pay

## 2021-02-08 ENCOUNTER — Other Ambulatory Visit: Payer: Medicare Other

## 2021-02-08 ENCOUNTER — Telehealth: Payer: Self-pay | Admitting: Internal Medicine

## 2021-02-08 ENCOUNTER — Telehealth: Payer: Self-pay

## 2021-02-08 DIAGNOSIS — I130 Hypertensive heart and chronic kidney disease with heart failure and stage 1 through stage 4 chronic kidney disease, or unspecified chronic kidney disease: Secondary | ICD-10-CM | POA: Diagnosis not present

## 2021-02-08 DIAGNOSIS — I5023 Acute on chronic systolic (congestive) heart failure: Secondary | ICD-10-CM | POA: Diagnosis not present

## 2021-02-08 DIAGNOSIS — R0902 Hypoxemia: Secondary | ICD-10-CM | POA: Diagnosis not present

## 2021-02-08 LAB — RENAL FUNCTION PANEL
Albumin: 3.1 g/dL — ABNORMAL LOW (ref 3.5–5.0)
Anion gap: 8 (ref 5–15)
BUN: 66 mg/dL — ABNORMAL HIGH (ref 8–23)
CO2: 29 mmol/L (ref 22–32)
Calcium: 9.4 mg/dL (ref 8.9–10.3)
Chloride: 98 mmol/L (ref 98–111)
Creatinine, Ser: 2.98 mg/dL — ABNORMAL HIGH (ref 0.44–1.00)
GFR, Estimated: 17 mL/min — ABNORMAL LOW (ref >60)
Glucose, Bld: 117 mg/dL — ABNORMAL HIGH (ref 70–99)
Phosphorus: 2.8 mg/dL (ref 2.5–4.6)
Potassium: 4.3 mmol/L (ref 3.5–5.1)
Sodium: 135 mmol/L (ref 135–145)

## 2021-02-08 LAB — CBC
HCT: 32.4 % — ABNORMAL LOW (ref 36.0–46.0)
Hemoglobin: 10.1 g/dL — ABNORMAL LOW (ref 12.0–15.0)
MCH: 30.3 pg (ref 26.0–34.0)
MCHC: 31.2 g/dL (ref 30.0–36.0)
MCV: 97.3 fL (ref 80.0–100.0)
Platelets: 66 10*3/uL — ABNORMAL LOW (ref 150–400)
RBC: 3.33 MIL/uL — ABNORMAL LOW (ref 3.87–5.11)
RDW: 15.9 % — ABNORMAL HIGH (ref 11.5–15.5)
WBC: 3.6 10*3/uL — ABNORMAL LOW (ref 4.0–10.5)
nRBC: 0 % (ref 0.0–0.2)

## 2021-02-08 MED ORDER — SODIUM CHLORIDE 0.9 % IV SOLN
250.0000 mg | Freq: Every day | INTRAVENOUS | Status: DC
  Administered 2021-02-08: 250 mg via INTRAVENOUS
  Filled 2021-02-08: qty 20

## 2021-02-08 MED ORDER — SENNOSIDES-DOCUSATE SODIUM 8.6-50 MG PO TABS
1.0000 | ORAL_TABLET | Freq: Every evening | ORAL | 0 refills | Status: DC | PRN
  Filled 2021-02-08: qty 30, 30d supply, fill #0

## 2021-02-08 MED ORDER — APIXABAN 2.5 MG PO TABS
2.5000 mg | ORAL_TABLET | Freq: Two times a day (BID) | ORAL | 0 refills | Status: DC
  Filled 2021-02-08: qty 60, 30d supply, fill #0

## 2021-02-08 MED ORDER — METOLAZONE 2.5 MG PO TABS: 2.5000 mg | ORAL_TABLET | Freq: Once | ORAL | Status: DC

## 2021-02-08 MED ORDER — SODIUM CHLORIDE 0.9 % IV SOLN: 510.0000 mg | Freq: Once | INTRAVENOUS | Status: DC

## 2021-02-08 MED ORDER — METOLAZONE 2.5 MG PO TABS
2.5000 mg | ORAL_TABLET | Freq: Once | ORAL | Status: AC
  Administered 2021-02-08: 2.5 mg via ORAL
  Filled 2021-02-08: qty 1

## 2021-02-08 MED ORDER — NICOTINE 21 MG/24HR TD PT24
21.0000 mg | MEDICATED_PATCH | Freq: Every day | TRANSDERMAL | 0 refills | Status: DC
  Filled 2021-02-08: qty 28, 28d supply, fill #0

## 2021-02-08 NOTE — Telephone Encounter (Signed)
HOSP FU PT APPT 02/19/2021 945

## 2021-02-08 NOTE — Telephone Encounter (Signed)
[facility] TOC HFU APPT REQUESTED PER DR WINTERS 02/11/2021 with DR. Allyson Sabal 02/11/2021 @ 10:15 am.   Please see message sent today  Maudie Mercury, MD  P Imp Front Desk Pool Good morning!  Hope ya'll are well. Could we by chance get [name] [name] an appointment for next Monday or Tuesday? If not, could we get one for Wednesday for heart failure hospital FU?  Thanks in advance!  Maudie Mercury, MD

## 2021-02-08 NOTE — Progress Notes (Signed)
Patient provided with discharge education and materials, verbalized understanding. IV access and telemetry monitor removed, no issues noted. Patient stated she will call her home health aid for transportation at this time.

## 2021-02-08 NOTE — Discharge Summary (Signed)
Name: [name] [name] MRN: [id number] DOB: 1950-12-23 70 y.o. PCP: [name], MD  Date of Admission: 02/01/2021 11:52 AM Date of Discharge:  02/08/2021 Attending Physician: Lucious Groves, DO   Discharge Diagnosis: Acute exacerbation of HFrEF (congestive heart failure) (HCC) Thrombocytopenia ([place] CKD stage 4 secondary to hypertension ([place]) with NAGMA Iron deficiency anemia Tobacco use disorder, chronic Paroxysmal atrial fibrillation, chronic  Discharge Medications: Allergies as of 02/08/2021      Reactions   Penicillins Itching   Did it involve swelling of the face/tongue/throat, SOB, or low BP? No Did it involve sudden or severe rash/hives, skin peeling, or any reaction on the inside of your mouth or nose? No Did you need to seek medical attention at a hospital or doctor's office? No When did it last happen?10 years If all above answers are "NO", may proceed with cephalosporin use.      Medication List    STOP taking these medications   diclofenac Sodium 1 % Gel Commonly known as: VOLTAREN   pantoprazole 40 MG tablet Commonly known as: PROTONIX   valsartan 80 MG tablet Commonly known as: DIOVAN     TAKE these medications   albuterol 108 (90 Base) MCG/ACT inhaler Commonly known as: ProAir HFA Inhale 2 puffs into the lungs every 6 (six) hours as needed for wheezing or shortness of breath (cough).   apixaban 2.5 MG Tabs tablet Commonly known as: ELIQUIS Take 1 tablet (2.5 mg total) by mouth 2 (two) times daily.   atorvastatin 40 MG tablet Commonly known as: LIPITOR Take 1 tablet (40 mg total) by mouth daily.   calcitRIOL 0.5 MCG capsule Commonly known as: ROCALTROL Take 1 capsule (0.5 mcg total) by mouth daily.   gabapentin 300 MG capsule Commonly known as: NEURONTIN Take 300 mg by mouth 2 (two) times daily.   hydrocerin Crea Apply 1 application topically 2 (two) times daily. What changed:   when to take this  reasons to take this    hydrOXYzine 10 MG tablet Commonly known as: ATARAX/VISTARIL Take 1 tablet (10 mg total) by mouth 2 (two) times daily as needed for itching.   Jardiance 10 MG Tabs tablet Generic drug: empagliflozin Take 1 tablet (10 mg total) by mouth daily before breakfast.   loperamide 2 MG capsule Commonly known as: IMODIUM Take 1 capsule(2 mg total) by mouth 4 (four) times daily as needed for diarrhea or loose stools.   nicotine 21 mg/24hr patch Commonly known as: NICODERM CQ - dosed in mg/24 hours Place 1 patch (21 mg total) onto the skin daily. Start taking on: Feb 09, 2021   senna-docusate 8.6-50 MG tablet Commonly known as: Senokot-S Take 1 tablet by mouth at bedtime as needed for mild constipation.   sevelamer carbonate 800 MG tablet Commonly known as: RENVELA Take 2 tablets (1,600 mg total) by mouth 3 (three) times daily with meals.   Torsemide 60 MG Tabs Take 60 mg by mouth in the morning and at bedtime.   Travoprost (BAK Free) 0.004 % Soln ophthalmic solution Commonly known as: TRAVATAN Place 1 drop into both eyes at bedtime.       Disposition and follow-up:   [name] was discharged from [facility] in Stable condition.  At the hospital follow up visit please address:  1.  Follow up: -Follow up in the Internal medicine clinic on 5/16 at 10.15 am.   2.  Labs / imaging needed at time of follow-up: BMP, CBC  3.  Pending labs/ test needing follow-up: None  Follow-up Appointments:  Follow-up Information    [name], MD. Go in 3 day(s).   Specialty: Internal Medicine Why: At 10 .15 am.  Contact information: [address] [address] [place] Hospital Course by problem list: Acute exacerbation of HFrEF (congestive heart failure) [facility]) Patient presents with orthopnea, paroxysmal nocturnal dyspnea, diffuse anasarca and BNP >4500 found to have acute on chronic heart failure due to  suboptimal medication adherence ( was taking furosemide '40mg'$  daily rather than her prescribed torsemide '60mg'$  twice daily). On exam she had 3+ tense edema of B/L lower extremities extending to chest wall and B/l upper extremities (Lt>Rt). Pt was started on IV lasix 60 mg BID which was increased to 80 mg later. Pt also received 2 doses of Metolazone 2.5 mg.  Pt's wt and daily weight were monitored and her swelling improved but didn't resolve completely. Pt lost 6.18 lbs weight and net loss of -36644 ml. Pt was advised to stay for few more days but Pt insisted on going home. Pt instructed to follow up with IM outpatient clinic on 02/11/21.  Left upper extremity swelling Ultrasound Left upper extremity ruled out  DVT.  Peripheral vascular disease Poor pulse in RLE. ABI normal range. Doppler waveforms suggest evidence of vessel calcification and/or arterial occlusive disease.   Thrombocytopenia ([place]) Platelets dropped from 92 from admission to 66 at discharge. Daily CBC done to monitor. No bleeding reported by Patient. Advised to continue monitoring at outpatient.   CKD stage 4 secondary to hypertension ([place]) with NAGMA.  Bicarb at admission 18. Pt was continued on Sevelamer and Calcitriol. Jardiance and Valsartan held due to poor renal function. Pt was started on Sod Bicaronate 1300 mg BId  which was held later due to stable bicarbonate. Goal Bicarb >22. Bicarb at discharge 29. May start at outpatient  If Bicarbonate low. Jardiance restarted at discharge.  Iron deficiency anemia in CHF Pt's Ferritin 160 and sat ratio 19.  Pt received 1 dose of Ferric gluconate before discharge.  Subjective on day of discharge: Patient was evaluated at bedside eating her breakfast. Patient states she is ready to go home today.  States she has an aid at home and she will be able to follow up early next week. Plan to transition her to Torsemide twice a day  With close follow up  Discharge Exam:   BP 101/71 (BP Location:  Left Arm)   Pulse 70   Temp 97.7 F (36.5 C) (Oral)   Resp 18   Wt 63.1 kg   SpO2 99%   BMI 25.44 kg/m  Discharge exam: Physical Exam Vitals and nursing note reviewed.  Constitutional:      General: She is not in acute distress.    Appearance: Normal appearance. She is not ill-appearing, toxic-appearing or diaphoretic.  HENT:     Head: Normocephalic and atraumatic.  Cardiovascular:     Rate and Rhythm: Normal rate.     Comments: Systolic murmur present Pulmonary:     Effort: Pulmonary effort is normal.     Breath sounds: Normal breath sounds.  Abdominal:     General: Abdomen is flat.     Palpations: Abdomen is soft.     Comments: Abdominal wall edema present.   Musculoskeletal:     Cervical back: Normal range of motion.     Left lower leg: Edema present.  Comments: Left upper extremity edema present.  Left Leg amputated below knee. .    Skin:    General: Skin is warm and dry.  Neurological:     General: No focal deficit present.     Mental Status: She is alert.  Psychiatric:        Mood and Affect: Mood normal.        Behavior: Behavior normal.     Pertinent Labs, Studies, and Procedures:  DG Chest Port 1 View  Result Date: 02/01/2021 CLINICAL DATA:  Shortness of breath.  Left arm edema. EXAM: PORTABLE CHEST 1 VIEW COMPARISON:  One-view chest x-ray scratched at two-view chest x-ray 11/21/2018 FINDINGS: Heart is enlarged. Aortic atherosclerotic changes are present. Defibrillator wires are stable. Small effusions and bibasilar atelectasis are similar prior study. Mild pulmonary vascular congestion is present. IMPRESSION: Cardiomegaly with mild pulmonary vascular congestion and small bilateral pleural effusions. Electronically Signed   By: San Morelle M.D.   On: 02/01/2021 20:37   Discharge Instructions: Discharge Instructions    Call MD for:  difficulty breathing, headache or visual disturbances   Complete by: As directed    Call MD for:  extreme fatigue    Complete by: As directed    Call MD for:  temperature >100.4   Complete by: As directed    Diet - low sodium heart healthy   Complete by: As directed    Increase activity slowly   Complete by: As directed      1.  Follow up in the Internal medicine clinic on 5/16 at 10.15 am. Be there at least 15 minutes prior to your appointment.  2. Take medications as prescribed on the discharge. 3. We held your Valsartan due to poor renal function. Do not start this medication without discussing with your PCP.  4. Please let PCP/Specialists know of any changes in medications that were made.  5. Please see medications section of this packet for any medication changes.   Signed: Armando Reichert, MD 02/08/2021, 11:40 AM   Pager: [phone]

## 2021-02-08 NOTE — TOC Transition Note (Signed)
Transition of Care [facility]) - CM/SW Discharge Note   Patient Details  Name: [name] [name] MRN: [id number] Date of Birth: [birth year]  Transition of Care [facility]) CM/SW Contact:  [name], RN Phone Number: 02/08/2021, 11:58 AM   Clinical Narrative:    Patient is for dc today, she is active with [facility] for HHRN,HHPT, would like to continue with them.  Kenzie notified.  MD notified for orders.   Final next level of care: [place] Barriers to Discharge: No Barriers Identified   Patient Goals and CMS Choice Patient states their goals for this hospitalization and ongoing recovery are:: return home CMS Medicare.gov Compare Post Acute Care list provided to:: Patient Choice offered to / list presented to : Patient  Discharge Placement                       Discharge Plan and Services   Discharge Planning Services: CM Consult Post Acute Care Choice: Resumption of Svcs/PTA Provider            DME Agency: NA       HH Arranged: RN,PT [place] Agency: [place] ([facility]) Date [place]: 02/08/21 Time [place]: [id number] Representative spoke with at [place]: [place] ([place]) Interventions     Readmission Risk Interventions Readmission Risk Prevention Plan 11/29/2020  Transportation Screening Complete  PCP or Specialist Appt within 3-5 Days Complete  HRI or [place] Complete  Social Work Consult for [place] Planning/Counseling Complete  Palliative Care Screening Not Applicable  Medication Review Press photographer) Complete  Some recent data might be hidden

## 2021-02-11 ENCOUNTER — Encounter: Payer: Medicare Other | Admitting: Student

## 2021-02-11 ENCOUNTER — Telehealth: Payer: Self-pay

## 2021-02-11 NOTE — Telephone Encounter (Addendum)
Transition Care Management Follow-up Telephone Call  Date of discharge and from where: 02/08/2021 for [place]  How have you been since you were released from the hospital? Pt stated that she is feeling much better. Pt did not have any questions or concerns.   Any questions or concerns? No  Items Reviewed:  Did the pt receive and understand the discharge instructions provided? Yes   Medications obtained and verified? Yes   Other? No   Any new allergies since your discharge? No   Dietary orders reviewed? Heart healthy  Do you have support at home? Yes   Home Care and Equipment/Supplies: Were home health services ordered? Yes If so, what is the name of the agency? [facility] Has the agency set up a time to come to the patient's home? Pt stated "no"   Functional Questionnaire: (I = Independent and D = Dependent) ADLs: D  Bathing/Dressing- I  Meal Prep- D - pt has an aid that comes.   Eating- I  Maintaining continence- I  Transferring/Ambulation- D - wheelchair   Managing Meds- I   Follow up appointments reviewed:   PCP Hospital f/u appt confirmed? Yes  Scheduled to see Virl Axe, MD on 02/18/2021 @ 09:45am.  [place] [facility] f/u appt confirmed? No    Are transportation arrangements needed? No   If their condition worsens, is the pt aware to call PCP or go to the Emergency Dept.? Yes  Was the patient provided with contact information for the PCP's office or ED? Yes  Was to pt encouraged to call back with questions or concerns? Yes

## 2021-02-12 ENCOUNTER — Other Ambulatory Visit: Payer: Self-pay | Admitting: *Deleted

## 2021-02-12 ENCOUNTER — Other Ambulatory Visit (HOSPITAL_BASED_OUTPATIENT_CLINIC_OR_DEPARTMENT_OTHER): Payer: Self-pay

## 2021-02-12 ENCOUNTER — Encounter (HOSPITAL_COMMUNITY): Payer: Medicare Other

## 2021-02-12 NOTE — Telephone Encounter (Signed)
Next appt scheduled  02/18/21 with Dr Allyson Sabal.

## 2021-02-14 ENCOUNTER — Telehealth: Payer: Self-pay | Admitting: *Deleted

## 2021-02-14 MED ORDER — ALBUTEROL SULFATE HFA 108 (90 BASE) MCG/ACT IN AERS: INHALATION_SPRAY | RESPIRATORY_TRACT | 11 refills | Status: DC

## 2021-02-14 NOTE — Telephone Encounter (Signed)
Katie, RN with [facility] called to confirm patient has appt on 5/23 at 9:45. States patient will need to have updated med list so Joellen Jersey can go over it with her. States there are 2 discrepancies:  1. Patient should be taking Eliquis 2.5 mg BID. Patient states she was not given this med at discharge and there is none in the home.  2. Patient should be taking torsemide 60 mg BID. Again, there is none in home. Patient is taking lasix 40 mg BID.

## 2021-02-14 NOTE — Telephone Encounter (Signed)
Donita, nurse with [facility] also called (see previous message below), she is requesting VO for: Skilled nursing visits  2X/wk for 1 week 1X/wk for 8 weeks VO given  Will route to red team for approval or denial.  Donita also wants to let MD know that patient does not have any nicotine patches in the home.  HFU scheduled for 02/18/21 w/ Dr. Allyson Sabal. SChaplin, RN,BSN

## 2021-02-15 ENCOUNTER — Telehealth: Payer: Self-pay | Admitting: Internal Medicine

## 2021-02-15 NOTE — Telephone Encounter (Signed)
Collier Salina, physical therapist, with [facility] requesting PT for patient.  Frequency 2 times a week for 2 weeks and then after 1x week for 6 weeks.

## 2021-02-15 NOTE — Telephone Encounter (Signed)
Returned call to Lane, PT with [facility]. Requesting VO for [facility] PT 2 week 2 and 1 week 6 to work on arms/legs strengthening and safe transfers. Verbal auth given. Will route to Red Team for agreement/denial.

## 2021-02-18 ENCOUNTER — Other Ambulatory Visit: Payer: Self-pay | Admitting: Internal Medicine

## 2021-02-18 ENCOUNTER — Other Ambulatory Visit: Payer: Self-pay | Admitting: Student

## 2021-02-18 ENCOUNTER — Ambulatory Visit (INDEPENDENT_AMBULATORY_CARE_PROVIDER_SITE_OTHER): Payer: Medicare Other | Admitting: Student

## 2021-02-18 VITALS — BP 136/75 | HR 70 | Temp 98.1°F | Wt 146.6 lb

## 2021-02-18 DIAGNOSIS — I739 Peripheral vascular disease, unspecified: Secondary | ICD-10-CM

## 2021-02-18 DIAGNOSIS — I10 Essential (primary) hypertension: Secondary | ICD-10-CM | POA: Diagnosis not present

## 2021-02-18 DIAGNOSIS — I25118 Atherosclerotic heart disease of native coronary artery with other forms of angina pectoris: Secondary | ICD-10-CM

## 2021-02-18 DIAGNOSIS — I5020 Unspecified systolic (congestive) heart failure: Secondary | ICD-10-CM | POA: Diagnosis not present

## 2021-02-18 DIAGNOSIS — I129 Hypertensive chronic kidney disease with stage 1 through stage 4 chronic kidney disease, or unspecified chronic kidney disease: Secondary | ICD-10-CM | POA: Diagnosis present

## 2021-02-18 DIAGNOSIS — D696 Thrombocytopenia, unspecified: Secondary | ICD-10-CM

## 2021-02-18 DIAGNOSIS — D649 Anemia, unspecified: Secondary | ICD-10-CM | POA: Diagnosis not present

## 2021-02-18 DIAGNOSIS — I2510 Atherosclerotic heart disease of native coronary artery without angina pectoris: Secondary | ICD-10-CM

## 2021-02-18 DIAGNOSIS — N184 Chronic kidney disease, stage 4 (severe): Secondary | ICD-10-CM

## 2021-02-18 DIAGNOSIS — K529 Noninfective gastroenteritis and colitis, unspecified: Secondary | ICD-10-CM

## 2021-02-18 MED ORDER — EMPAGLIFLOZIN 10 MG PO TABS: 10.0000 mg | ORAL_TABLET | Freq: Every day | ORAL | 2 refills | Status: DC

## 2021-02-18 MED ORDER — ATORVASTATIN CALCIUM 40 MG PO TABS: 40.0000 mg | ORAL_TABLET | Freq: Every day | ORAL | 3 refills | Status: DC

## 2021-02-18 MED ORDER — APIXABAN 2.5 MG PO TABS: 2.5000 mg | ORAL_TABLET | Freq: Two times a day (BID) | ORAL | 0 refills | Status: DC
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
## 2021-02-18 NOTE — Assessment & Plan Note (Addendum)
Patient found to be thrombocytopenic during hospitalization.  At discharge, her platelet count was 66.  Will obtain CBC today to check for worsening or improvement.  She denies any bleeding events since discharge.  Plan: -f/u CBC

## 2021-02-18 NOTE — Assessment & Plan Note (Signed)
Patient with history of chronic kidney disease stage IV, not a candidate for dialysis.  She is currently on sevelamer and follows with nephrology.  During recent hospitalization, found to have bicarb levels chronically low.  She was treated with sodium bicarbonate 13 mg twice daily and at discharge her bicarbonate was 29.  In the setting of chronic kidney disease, it is important to ensure that patient does not remain chronically acidotic.  Will repeat BMP to recheck bicarbonate levels today.  If bicarbonate level below 22, will restart sodium bicarbonate therapy.  Plan: -continue sevelamer -close follow up with nephrology -f/u BMP, if bicarb <22 then prescribe sodium bicarbonate

## 2021-02-18 NOTE — Assessment & Plan Note (Signed)
History of HFrEF, currently on torsemide 60 mg twice daily and Jardiance 10 mg daily.  She was recently hospitalized for HFrEF exacerbation secondary to suboptimal diuresis.  She was taking Lasix 40 mg daily instead of taking her prescribed dose of torsemide 60 mg twice daily.  During her encounter today, she reports still taking Lasix 40 mg daily as she has not received her torsemide.  On exam, she continues to have 1-2+ pitting edema of her left upper extremity up to mid-biceps along with 1-2+ pitting edema of right lower extremity and left lower extremity up to distal thighs. JVP measured to be about 8cm. She denies any shortness of breath at this time. Contacted patient's pharmacy and discussed with him the importance of doing her torsemide for optimal diuresis.  They reported that patient's torsemide was being filled right now and will be sent to her today.   It is very important for patient to begin taking her prescribed torsemide 60 mg twice daily as Lasix 40 mg is not adequate enough to maintain optimal fluid status.  Emphasized the importance of this to the patient and she confirms understanding.  Plan: -start Torsemide '60mg'$  BID -continue jardiance '10mg'$  daily -f/u in 1 week for repeat f/u of HF as she is high-risk

## 2021-02-18 NOTE — Assessment & Plan Note (Signed)
Today's Vitals   02/18/21 0932  BP: 136/75  Pulse: 70  Temp: 98.1 F (36.7 C)  TempSrc: Oral  SpO2: 100%  Weight: 146 lb 9.6 oz (66.5 kg)   Body mass index is 26.81 kg/m.   Patient with history of hypertension, was previously on valsartan, Coreg, and BiDil.  These medications were stopped at last hospital admission due to extremely worsening kidney function.  Despite being off of medications for 2 weeks now, her blood pressure is well controlled today.  Will not start any antihypertensive therapy at this time in lieu of of poor kidney function.

## 2021-02-18 NOTE — Patient Instructions (Signed)
[name] [name],  It was a pleasure seeing you in the clinic today.   1. Please stop taking furosemide at home. Please start taking Torsemide '60mg'$  twice daily. I contacted your pharmacy and they will be sending it today. This will help with your swelling.  2. We are getting some blood work today. I will call you with the results.  3. Please come back in 1 week to ensure your swelling is improving.  Please call our clinic at [phone] if you have any questions or concerns. The best time to call is Monday-Friday from 9am-4pm, but there is someone available 24/7 at the same number. If you need medication refills, please notify your pharmacy one week in advance and they will send [place] a request.   Thank you for letting us take part in your care. We look forward to seeing you next time!

## 2021-02-19 ENCOUNTER — Telehealth: Payer: Self-pay | Admitting: *Deleted

## 2021-02-19 ENCOUNTER — Encounter: Payer: Medicare Other | Admitting: Student

## 2021-02-19 LAB — CBC WITH DIFFERENTIAL/PLATELET
Basophils Absolute: 0 10*3/uL (ref 0.0–0.2)
Basos: 1 %
EOS (ABSOLUTE): 0.4 10*3/uL (ref 0.0–0.4)
Eos: 9 %
Hematocrit: 31.8 % — ABNORMAL LOW (ref 34.0–46.6)
Hemoglobin: 10.7 g/dL — ABNORMAL LOW (ref 11.1–15.9)
Immature Grans (Abs): 0 10*3/uL (ref 0.0–0.1)
Immature Granulocytes: 0 %
Lymphocytes Absolute: 0.8 10*3/uL (ref 0.7–3.1)
Lymphs: 20 %
MCH: 30.9 pg (ref 26.6–33.0)
MCHC: 33.6 g/dL (ref 31.5–35.7)
MCV: 92 fL (ref 79–97)
Monocytes Absolute: 0.6 10*3/uL (ref 0.1–0.9)
Monocytes: 16 %
Neutrophils Absolute: 2 10*3/uL (ref 1.4–7.0)
Neutrophils: 54 %
Platelets: 75 10*3/uL — CL (ref 150–450)
RBC: 3.46 x10E6/uL — ABNORMAL LOW (ref 3.77–5.28)
RDW: 14.5 % (ref 11.7–15.4)
WBC: 3.8 10*3/uL (ref 3.4–10.8)

## 2021-02-19 LAB — BMP8+ANION GAP
Anion Gap: 21 mmol/L — ABNORMAL HIGH (ref 10.0–18.0)
BUN/Creatinine Ratio: 23 (ref 12–28)
BUN: 85 mg/dL (ref 8–27)
CO2: 21 mmol/L (ref 20–29)
Calcium: 9 mg/dL (ref 8.7–10.3)
Chloride: 96 mmol/L (ref 96–106)
Creatinine, Ser: 3.63 mg/dL — ABNORMAL HIGH (ref 0.57–1.00)
Glucose: 64 mg/dL — ABNORMAL LOW (ref 65–99)
Potassium: 4.7 mmol/L (ref 3.5–5.2)
Sodium: 138 mmol/L (ref 134–144)
eGFR: 13 mL/min/{1.73_m2} — ABNORMAL LOW (ref >59)

## 2021-02-19 NOTE — Progress Notes (Signed)
Internal Medicine Clinic Attending  Case discussed with Dr. Allyson Sabal  At the time of the visit.  We reviewed the resident's history and exam and pertinent patient test results.  I agree with the assessment, diagnosis, and plan of care documented in the resident's note. As noted patient was discharged from hospital before becoming euvolemic per her wishes. She has yet to receive torsemide but this should be delivered today.  In looking at her prescription report I am concerned she may still be receiving valsartan, dr Allyson Sabal is calling pharmacy to make sure this gets canceled and not delivered.  She is certainly high risk given advanced CKD and HFrEF and poor adherence to medication/follow up and leaving hospital before fully treated.  Would continue close outpatient follow up.

## 2021-02-22 ENCOUNTER — Ambulatory Visit (INDEPENDENT_AMBULATORY_CARE_PROVIDER_SITE_OTHER): Payer: Medicare Other | Admitting: Student

## 2021-02-22 ENCOUNTER — Other Ambulatory Visit: Payer: Self-pay

## 2021-02-22 DIAGNOSIS — I2510 Atherosclerotic heart disease of native coronary artery without angina pectoris: Secondary | ICD-10-CM | POA: Diagnosis not present

## 2021-02-22 DIAGNOSIS — I129 Hypertensive chronic kidney disease with stage 1 through stage 4 chronic kidney disease, or unspecified chronic kidney disease: Secondary | ICD-10-CM | POA: Diagnosis not present

## 2021-02-22 DIAGNOSIS — Z950 Presence of cardiac pacemaker: Secondary | ICD-10-CM | POA: Diagnosis not present

## 2021-02-22 DIAGNOSIS — Z89512 Acquired absence of left leg below knee: Secondary | ICD-10-CM

## 2021-02-22 DIAGNOSIS — N184 Chronic kidney disease, stage 4 (severe): Secondary | ICD-10-CM | POA: Diagnosis not present

## 2021-02-22 NOTE — Assessment & Plan Note (Addendum)
Spoke with [name] via telephone call and discussed kidney function results.  Her recent labs show creatinine 3.63 and BUN 85, both of which are increased from prior lab result.  [name] has a history of fluctuating creatinine and BUN due to advanced chronic kidney disease.  Unfortunately she is not a candidate for dialysis and will need to be medically managed.  She does follow closely with nephrology for this.  She is currently on sevelamer.  Her bicarbonate on her BMP was near normal.  I believe that her recent worsening of her kidney function is from suboptimal diuresis.  Upon discharge from the hospital, she was advised to take torsemide 60 mg twice daily.  However she has continued to take furosemide 40 mg daily and presented with lower extremity swelling during my last office visit with her on 02/18/2021.  I discussed with her at length about switching from furosemide to torsemide and she agreed with this.  I spoke with her pharmacy and they noted that they will be dispensing the torsemide and delivering it to her on the day of my last office visit.  However upon speaking with her today, she states that she still does not have torsemide and she is still taking her furosemide instead.  Spoke with her pharmacy ([facility]) and they stated that the torsemide was delivered on 05/23. Stated that they would call [name] for further details and to make sure she has torsemide at home.  Also discussed with [name] about benefit of weekly BMPs as an outpatient as she does not want to be hospitalized.  She agrees with this.  Plan: -continue sevelamer -switch to torsemide '60mg'$  BID -weekly BMPs to trend kidney function -close nephrology follow up
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
## 2021-02-26 ENCOUNTER — Telehealth: Payer: Self-pay | Admitting: *Deleted

## 2021-02-26 ENCOUNTER — Telehealth: Payer: Medicare Other

## 2021-02-26 NOTE — Telephone Encounter (Signed)
  Care Management   Outreach Note  02/26/2021 Name: [name] [name] MRN: [id number] DOB: [birth year]  Referred by: [name], MD Reason for referral : Care Coordination (NIDDM,  HTN, HLD, CAD, HF, CKD stage 4, PAF, CHB with pacemaker, L BKA, hyperthyroidism)   An unsuccessful telephone outreach was attempted today. The patient was referred to the case management team for assistance with care management and care coordination.   Follow Up Plan: A HIPAA compliant phone message was left for the patient providing contact information and requesting a return call.  The care management team will reach out to the patient again over the next 14-21 days.   [name] RN, CCM, [facility] RN Care Manager [phone]

## 2021-02-26 NOTE — Telephone Encounter (Addendum)
Information was sent to [facility] for PA for Albuterol HFA on 02/19/2021.    Denied.  Additional informaton was resent.  Call to Pharmacy about [facility] which patient has used . Needs PA for [facility] as well.  Sander Nephew, RN 02/26/2021 11:08 AM.  Joylene Igo from [employer].  PA for Albuterol was approved through 09/28/2021.  Sander Nephew, RN 02/28/2021 11:35 AM.

## 2021-02-26 NOTE — Telephone Encounter (Signed)
CM sent to [name] at [facility] for manual w/c. F2F was 02/22/21.

## 2021-02-26 NOTE — Telephone Encounter (Signed)
Theophilus Bones, RN; Sandi Raveling, [place]; Samples, Rise Paganini; [place], [place]; 1 other   received, thanks

## 2021-02-27 ENCOUNTER — Telehealth: Payer: Self-pay | Admitting: Internal Medicine

## 2021-02-27 NOTE — Telephone Encounter (Signed)
Agree with contacting patient's pharmacy for assessing if there was a problem with delivery, whether a refill is necessary, engaging patient in medication management/teaching and contacting patient's cardiologist. Our team has had extensive discussions with patient during outpatient clinic visits and hospitalizations with recommendation to discontinue furosemide, transition to torsemide and take apixaban as prescribed. Thank you.

## 2021-02-27 NOTE — Telephone Encounter (Signed)
RTC to Ritu, nurse at [facility].  She wanted to let us know that patient hasn't been taking her Torsemide or her Eliquis.  States patient is still taking the lasix, patient told [facility] nurse she didn't have any eliquis or torsemide.  [facility] nurse states patient was very guarded and didn't want to give her information regarding her medication.  RN informed [facility] nurse that per Dr. Clinton Sawyer office visit note on 02/18/21, patient was to stop lasix, and only take toresmide, also noted that Dr. Allyson Sabal called patient's pharmacy and had them deliver torsemide to patient that day.  [facility] nurse also informed recent RX for Eliquis was also sent to pharmacy w/ refills.  [facility] nurse states she will call patient's pharmacy, [facility], and find out when RX's were delivered and get refilled if necessary.  She also states she will call patient back today for med management/teaching and will also notify cardiologist office that patient has been noncompliant w/ Torsemide.  Will forward to red team. Laurence Compton, RN,BSN

## 2021-02-27 NOTE — Telephone Encounter (Signed)
[facility]s contact [facility] [phone]

## 2021-03-04 ENCOUNTER — Telehealth: Payer: Medicare Other

## 2021-03-04 ENCOUNTER — Ambulatory Visit (HOSPITAL_COMMUNITY)
Admission: RE | Admit: 2021-03-04 | Discharge: 2021-03-04 | Disposition: A | Discharge status: Home / Self Care | Payer: Medicare Other | Source: Ambulatory Visit | Attending: Cardiology | Admitting: Cardiology

## 2021-03-04 ENCOUNTER — Other Ambulatory Visit: Payer: Self-pay

## 2021-03-04 ENCOUNTER — Encounter (HOSPITAL_COMMUNITY): Payer: Self-pay

## 2021-03-04 VITALS — BP 142/76 | HR 69 | Wt 143.0 lb

## 2021-03-04 DIAGNOSIS — E1140 Type 2 diabetes mellitus with diabetic neuropathy, unspecified: Secondary | ICD-10-CM | POA: Diagnosis not present

## 2021-03-04 DIAGNOSIS — Z8674 Personal history of sudden cardiac arrest: Secondary | ICD-10-CM | POA: Diagnosis not present

## 2021-03-04 DIAGNOSIS — N184 Chronic kidney disease, stage 4 (severe): Secondary | ICD-10-CM | POA: Insufficient documentation

## 2021-03-04 DIAGNOSIS — E1122 Type 2 diabetes mellitus with diabetic chronic kidney disease: Secondary | ICD-10-CM | POA: Diagnosis not present

## 2021-03-04 DIAGNOSIS — I130 Hypertensive heart and chronic kidney disease with heart failure and stage 1 through stage 4 chronic kidney disease, or unspecified chronic kidney disease: Secondary | ICD-10-CM | POA: Diagnosis not present

## 2021-03-04 DIAGNOSIS — Z950 Presence of cardiac pacemaker: Secondary | ICD-10-CM | POA: Insufficient documentation

## 2021-03-04 DIAGNOSIS — Z8249 Family history of ischemic heart disease and other diseases of the circulatory system: Secondary | ICD-10-CM | POA: Insufficient documentation

## 2021-03-04 DIAGNOSIS — I5022 Chronic systolic (congestive) heart failure: Secondary | ICD-10-CM | POA: Diagnosis present

## 2021-03-04 DIAGNOSIS — E1151 Type 2 diabetes mellitus with diabetic peripheral angiopathy without gangrene: Secondary | ICD-10-CM | POA: Diagnosis not present

## 2021-03-04 DIAGNOSIS — E7800 Pure hypercholesterolemia, unspecified: Secondary | ICD-10-CM | POA: Insufficient documentation

## 2021-03-04 DIAGNOSIS — F17210 Nicotine dependence, cigarettes, uncomplicated: Secondary | ICD-10-CM | POA: Insufficient documentation

## 2021-03-04 DIAGNOSIS — I2510 Atherosclerotic heart disease of native coronary artery without angina pectoris: Secondary | ICD-10-CM | POA: Insufficient documentation

## 2021-03-04 DIAGNOSIS — I480 Paroxysmal atrial fibrillation: Secondary | ICD-10-CM | POA: Diagnosis not present

## 2021-03-04 DIAGNOSIS — Z79899 Other long term (current) drug therapy: Secondary | ICD-10-CM | POA: Insufficient documentation

## 2021-03-04 DIAGNOSIS — Z89512 Acquired absence of left leg below knee: Secondary | ICD-10-CM | POA: Diagnosis not present

## 2021-03-04 DIAGNOSIS — Z7901 Long term (current) use of anticoagulants: Secondary | ICD-10-CM | POA: Diagnosis not present

## 2021-03-04 LAB — BASIC METABOLIC PANEL
Anion gap: 11 (ref 5–15)
BUN: 65 mg/dL — ABNORMAL HIGH (ref 8–23)
CO2: 21 mmol/L — ABNORMAL LOW (ref 22–32)
Calcium: 9.3 mg/dL (ref 8.9–10.3)
Chloride: 107 mmol/L (ref 98–111)
Creatinine, Ser: 3.71 mg/dL — ABNORMAL HIGH (ref 0.44–1.00)
GFR, Estimated: 13 mL/min — ABNORMAL LOW (ref >60)
Glucose, Bld: 103 mg/dL — ABNORMAL HIGH (ref 70–99)
Potassium: 4.5 mmol/L (ref 3.5–5.1)
Sodium: 139 mmol/L (ref 135–145)

## 2021-03-04 NOTE — Patient Instructions (Addendum)
No medication changes today.  Labs today We will only contact you if something comes back abnormal or we need to make some changes. Otherwise no news is good news!  Your physician recommends that you schedule a follow-up appointment in: 4 weeks with the NP/PA.  Please call office at [phone] option [phone] if you have any questions or concerns.    At the [facility], you and your health needs are our priority. As part of our continuing mission to provide you with exceptional heart care, we have created designated Provider Care Teams. These Care Teams include your primary Cardiologist (physician) and Advanced Practice Providers (APPs- Physician Assistants and Nurse Practitioners) who all work together to provide you with the care you need, when you need it.   You may see any of the following providers on your designated Care Team at your next follow up: Marland Kitchen Dr Glori Bickers . Dr Loralie Champagne . Dr Vickki Muff . Darrick Grinder, NP . Lyda Jester, [place] . Audry Riles, PharmD   Please be sure to bring in all your medications bottles to every appointment.
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
## 2021-03-04 NOTE — Addendum Note (Signed)
Encounter addended by: Consuelo Pandy, PA-C on: 03/04/2021 2:09 PM  Actions taken: Clinical Note Signed, Result note filed

## 2021-03-06 ENCOUNTER — Other Ambulatory Visit: Payer: Self-pay | Admitting: Student

## 2021-03-06 ENCOUNTER — Other Ambulatory Visit (HOSPITAL_COMMUNITY): Payer: Self-pay | Admitting: *Deleted

## 2021-03-06 MED ORDER — APIXABAN 2.5 MG PO TABS: 2.5000 mg | ORAL_TABLET | Freq: Two times a day (BID) | ORAL | 0 refills | Status: DC

## 2021-03-08 ENCOUNTER — Telehealth: Payer: Self-pay | Admitting: Student

## 2021-03-08 DIAGNOSIS — Z89512 Acquired absence of left leg below knee: Secondary | ICD-10-CM

## 2021-03-08 NOTE — Telephone Encounter (Signed)
Rec'd phone call from [place] Representative stating that the OV notes that were submitted did not have the patient current Height and Weight .  [facility] also needs a Prescription Submitted to their fax number of [phone] with the Height and Weight to meet Medicare guidelines for an approval.

## 2021-03-11 ENCOUNTER — Other Ambulatory Visit (HOSPITAL_COMMUNITY): Payer: Self-pay | Admitting: Internal Medicine

## 2021-03-13 ENCOUNTER — Ambulatory Visit: Payer: Medicare Other

## 2021-03-13 ENCOUNTER — Telehealth: Payer: Medicare Other

## 2021-03-13 NOTE — Patient Instructions (Signed)
Visit Information   Goals Addressed             This Visit's Progress    Blood Pressure < 140/90       BP Readings from Last 3 Encounters:  03/04/21 (!) 142/76  02/18/21 136/75  02/08/21 101/71   Not meeting blood pressure targets      HEMOGLOBIN A1C < 7       Lab Results  Component Value Date   HGBA1C 5.0 11/12/2020         LDL CALC < 100       Lab Results  Component Value Date   CHOL 96 11/28/2020   HDL 58 11/28/2020   LDLCALC 32 11/28/2020   TRIG 32 11/28/2020   CHOLHDL 1.7 11/28/2020         Make and Keep All Appointments       Timeframe:  Long-Range Goal Priority:  High Start Date:          09/19/20                   Expected End Date:       9/31/22                  - arrange a ride through an agency 1 week before appointment - call to cancel if needed - keep a calendar with prescription refill dates - keep a calendar with appointment dates    Why is this important?   Part of staying healthy is seeing the doctor for follow-up care.  If you forget your appointments, there are some things you can do to stay on track.    Notes:       Track and Manage Symptoms-Heart Failure       Timeframe:  Long-Range Goal Priority:  High Start Date:     12/18/20                        Expected End Date:      ongoing                 Follow Up Date 06/25/21   - know when to call the doctor - track symptoms and what helps feel better or worse    Why is this important?   You will be able to handle your symptoms better if you keep track of them.  Making some simple changes to your lifestyle will help.  Eating healthy is one thing you can do to take good care of yourself.    Notes:          The patient verbalized understanding of instructions, educational materials, and care plan provided today and declined offer to receive copy of patient instructions, educational materials, and care plan.   Telephone follow up appointment with care management team member  scheduled for: one month  Johnney Killian, RN, BSN, CCM Care Management Coordinator [facility] Internal Medicine Phone: [phone] / Fax: [phone]

## 2021-03-13 NOTE — Chronic Care Management (AMB) (Signed)
Care Management    RN Visit Note  03/13/2021 Name: [name] [name] MRN: [id number] DOB: May 20, 1951  Subjective: [name] [name] is a 70 y.o. year old female who is a primary care patient of [name] Payment, MD. The care management team was consulted for assistance with disease management and care coordination needs.    Engaged with patient by telephone for follow up visit in response to provider referral for case management and/or care coordination services.   Consent to Services:   [name] [name] was given information about Care Management services today including:  Care Management services includes personalized support from designated clinical staff supervised by her physician, including individualized plan of care and coordination with other care providers 24/7 contact phone numbers for assistance for urgent and routine care needs. The patient may stop case management services at any time by phone call to the office staff.  Patient agreed to services and consent obtained.   Assessment: Patient is currently experiencing difficulty with  not feeling well today, no specific information given to [facility].Marland Kitchen See Care Plan below for interventions and patient self-care actives. Follow up Plan: Patient would like continued follow-up.  CCM RNCM will outreach the patient within the next month.  Patient will call office if needed prior to next encounter : Review of patient past medical history, allergies, medications, health status, including review of consultants reports, laboratory and other test data, was performed as part of comprehensive evaluation and provision of chronic care management services.   SDOH (Social Determinants of Health) assessments and interventions performed:    Care Plan  Allergies  Allergen Reactions   Penicillins Itching    Did it involve swelling of the face/tongue/throat, SOB, or low BP? No Did it involve sudden or severe rash/hives, skin peeling, or any reaction on  the inside of your mouth or nose? No Did you need to seek medical attention at a hospital or doctor's office? No When did it last happen?      10 years If all above answers are "NO", may proceed with cephalosporin use.     Outpatient Encounter Medications as of 03/13/2021  Medication Sig   albuterol (PROVENTIL) (2.5 MG/3ML) 0.083% nebulizer solution Take 3 mLs (2.5 mg total) by nebulization every 6 (six) hours as needed for wheezing or shortness of breath.   apixaban (ELIQUIS) 2.5 MG TABS tablet Take 1 tablet (2.5 mg total) by mouth 2 (two) times daily.   atorvastatin (LIPITOR) 40 MG tablet Take 1 tablet (40 mg total) by mouth daily.   calcitRIOL (ROCALTROL) 0.5 MCG capsule Take 1 capsule (0.5 mcg total) by mouth daily.   empagliflozin (JARDIANCE) 10 MG TABS tablet Take 1 tablet (10 mg total) by mouth daily before breakfast.   gabapentin (NEURONTIN) 300 MG capsule Take 300 mg by mouth 2 (two) times daily.   hydrocerin (EUCERIN) CREA Apply 1 application topically 2 (two) times daily. (Patient taking differently: Apply 1 application topically 2 (two) times daily as needed (dry skin).)   hydrOXYzine (ATARAX/VISTARIL) 10 MG tablet Take 1 tablet (10 mg total) by mouth 2 (two) times daily as needed for itching.   loperamide (IMODIUM) 2 MG capsule Take 1 capsule(2 mg total) by mouth 4 (four) times daily as needed for diarrhea or loose stools.   senna-docusate (SENOKOT-S) 8.6-50 MG tablet Take 1 tablet by mouth at bedtime as needed for mild constipation.   sevelamer carbonate (RENVELA) 800 MG tablet Take 2 tablets (1,600 mg total) by mouth 3 (three) times daily with  meals.   Torsemide 60 MG TABS Take 60 mg by mouth in the morning and at bedtime.   Travoprost, BAK Free, (TRAVATAN) 0.004 % SOLN ophthalmic solution Place 1 drop into both eyes at bedtime.   No facility-administered encounter medications on file as of 03/13/2021.    Patient Active Problem List   Diagnosis Date Noted   Anasarca     Deficit in activities of daily living (ADL) 12/19/2020   AKI (acute kidney injury) ([place])    Hypervolemia 11/21/2020   Hyperthyroidism 11/14/2020   Acute cholecystitis 11/11/2020   Callus 11/07/2020   Heart failure with reduced ejection fraction ([place]) 10/05/2020   Paroxysmal atrial fibrillation ([place]) 08/30/2020   Secondary hypercoagulable state ([place]) 08/30/2020   Diabetes mellitus ([place]) 07/13/2020   Acute exacerbation of CHF (congestive heart failure) ([place]) 06/20/2020   Pruritus 06/20/2020   Hx of BKA, left ([place]) 04/20/2019   Chronic cough 08/03/2018   Status post reverse total shoulder replacement, left    Neck pain, chronic 10/20/2017   Cognitive impairment 08/28/2016   Cardiomyopathy ([place])    Vitamin D deficiency 06/01/2014   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[place]) 03/11/2013   Preventative health care 02/09/2013   Below knee amputation status, left 12/20/2012   Thrombocytopenia ([place]) 11/04/2012   CKD stage 4 secondary to hypertension ([place]) 11/04/2012   Normocytic anemia 11/04/2012   CAD (coronary artery disease)--hx of arrest s/p pacemaker 11/04/2012   Chronic diarrhea 08/25/2012   Complete heart block ([name])    PACEMAKER-[employer] 03/15/2010   HYPERPARATHYROIDISM, SECONDARY 01/16/2010   Hyperlipidemia 11/23/2007   DIABETIC  RETINOPATHY 08/12/2006   DIABETIC PERIPHERAL NEUROPATHY 08/12/2006   Essential hypertension 08/12/2006   PERIPHERAL VASCULAR DISEASE 08/12/2006    Conditions to be addressed/monitored: HTN, HLD, and DMII  Care Plan : CCM RN - Improve Medication Taking Behavior  Updates made by Johnney Killian, RN since 03/13/2021 12:00 AM     Problem: Medication Adherence (Wellness)      Goal: Medication Adherence Improved   Start Date: 09/19/2020  Expected End Date: 06/28/2021  Recent Progress: Not on track  Priority: High  Note:   Current Barriers:  Non-adherence to prescribed medication regimen Does not adhere to prescribed medication regimen- spoke with  patient via phone, she stated she was not feeling well and could not review her medications.  Nurse Case Manager Clinical Goal(s):  Over the next 30-60 days, patient will work with home health RN and health care team to improve medication taking behavior  Interventions:  1:1 collaboration with [name] Payment, MD regarding development and update of comprehensive plan of care as evidenced by provider attestation and co-signature Inter-disciplinary care team collaboration (see longitudinal plan of care) Reminded patient to bring medications and/or list of current medications she is taking with her to all provider appointments Reviewed with patient Dr Bensimhon's directions during HF clinic visit on 4/21 regarding torsemide and limiting fluids  Patient Goals/Self-Care Activities Over the next 30-60 days, patient will:  - Patient will self administer medications as prescribed Patient will attend all scheduled provider appointments Patient will call pharmacy for medication refills Meet with pharmacist to address medication taking barriers  Follow Up Plan: The care management team will reach out to the patient again over the next 30-60 days.          Plan: Telephone follow up appointment with care management team member scheduled for:  one month  Johnney Killian, RN, BSN, CCM Care Management Coordinator [facility] Internal Medicine Phone: [phone] / Fax:  [phone]           

## 2021-03-14 ENCOUNTER — Telehealth: Payer: Self-pay

## 2021-03-14 NOTE — Telephone Encounter (Signed)
Manual wheelchair order placed with requested information. Thanks so much

## 2021-03-14 NOTE — Telephone Encounter (Signed)
Requesting to speak with a nurse about meds not working. Please call pt back.

## 2021-03-14 NOTE — Telephone Encounter (Addendum)
Please place new DME order for manual w/c and add HT 5'2" and WT 143 lbs. Thank you.

## 2021-03-14 NOTE — Telephone Encounter (Signed)
RTC, patient c/o "itching all over my body".  She was prescribed Hydroxyzine '10mg'$  BID PRN on 01/31/21 for Pruritus, which she states she has been taking, but c/o itching worsening over last several days.  She denies having any hives or rashes.  She states she is getting no relief from the Hydroxyzine and is asking if something different can be sent to her pharmacy.  There are no openings on the red team this week, patient has an upcoming appt on 03/20/21 w/ Dr. Gilford Rile. Will forward to red team to advise. Thank you, SChaplin, RN,BSN

## 2021-03-14 NOTE — Telephone Encounter (Signed)
CM sent to [name] at [employer] that HT 5'2" and WT 143 lbs has been added to order for manual w/c.

## 2021-03-15 NOTE — Telephone Encounter (Signed)
RTC, VM obtained and message left to call triage nurse back. SChaplin, RN,BSN  

## 2021-03-19 ENCOUNTER — Other Ambulatory Visit: Payer: Self-pay | Admitting: Student

## 2021-03-20 ENCOUNTER — Other Ambulatory Visit: Payer: Self-pay

## 2021-03-20 ENCOUNTER — Ambulatory Visit (INDEPENDENT_AMBULATORY_CARE_PROVIDER_SITE_OTHER): Payer: Medicare Other | Admitting: Internal Medicine

## 2021-03-20 ENCOUNTER — Encounter: Payer: Self-pay | Admitting: Internal Medicine

## 2021-03-20 VITALS — BP 154/86 | HR 70 | Temp 98.2°F | Resp 28 | Ht 62.0 in | Wt 145.2 lb

## 2021-03-20 DIAGNOSIS — I129 Hypertensive chronic kidney disease with stage 1 through stage 4 chronic kidney disease, or unspecified chronic kidney disease: Secondary | ICD-10-CM | POA: Diagnosis not present

## 2021-03-20 DIAGNOSIS — R300 Dysuria: Secondary | ICD-10-CM | POA: Diagnosis not present

## 2021-03-20 DIAGNOSIS — N184 Chronic kidney disease, stage 4 (severe): Secondary | ICD-10-CM | POA: Diagnosis not present

## 2021-03-20 DIAGNOSIS — L299 Pruritus, unspecified: Secondary | ICD-10-CM

## 2021-03-20 MED ORDER — HYDROXYZINE HCL 10 MG PO TABS: 10.0000 mg | ORAL_TABLET | Freq: Two times a day (BID) | ORAL | 1 refills | Status: DC | PRN
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
## 2021-03-20 NOTE — Patient Instructions (Signed)
Ms. Janiga,   It was a pleasure meeting you. Today we discussed your refill for hydroxaxine,

## 2021-03-21 ENCOUNTER — Ambulatory Visit (INDEPENDENT_AMBULATORY_CARE_PROVIDER_SITE_OTHER): Payer: Self-pay

## 2021-03-21 DIAGNOSIS — R300 Dysuria: Secondary | ICD-10-CM | POA: Insufficient documentation

## 2021-03-21 DIAGNOSIS — I442 Atrioventricular block, complete: Secondary | ICD-10-CM

## 2021-03-21 LAB — MICROSCOPIC EXAMINATION
Bacteria, UA: NONE SEEN
Casts: NONE SEEN /lpf

## 2021-03-21 LAB — BMP8+ANION GAP
Anion Gap: 19 mmol/L — ABNORMAL HIGH (ref 10.0–18.0)
BUN/Creatinine Ratio: 15 (ref 12–28)
BUN: 46 mg/dL — ABNORMAL HIGH (ref 8–27)
CO2: 15 mmol/L — ABNORMAL LOW (ref 20–29)
Calcium: 9.5 mg/dL (ref 8.7–10.3)
Chloride: 111 mmol/L — ABNORMAL HIGH (ref 96–106)
Creatinine, Ser: 3.14 mg/dL — ABNORMAL HIGH (ref 0.57–1.00)
Glucose: 74 mg/dL (ref 65–99)
Potassium: 4.7 mmol/L (ref 3.5–5.2)
Sodium: 145 mmol/L — ABNORMAL HIGH (ref 134–144)
eGFR: 16 mL/min/{1.73_m2} — ABNORMAL LOW (ref >59)

## 2021-03-21 LAB — URINALYSIS, COMPLETE
Bilirubin, UA: NEGATIVE
Glucose, UA: NEGATIVE
Ketones, UA: NEGATIVE
Leukocytes,UA: NEGATIVE
Nitrite, UA: POSITIVE — AB
Specific Gravity, UA: 1.015 (ref 1.005–1.030)
Urobilinogen, Ur: 0.2 mg/dL (ref 0.2–1.0)
pH, UA: 5 (ref 5.0–7.5)

## 2021-03-21 NOTE — Assessment & Plan Note (Addendum)
Patient with history of CKD stage IV presents to the clinic today.  She states that she has been compliant with all of her medications and has had no side effects from medications.  Her last BMP showed a creatinine of 3.71. Following along with nephrology.  Her torsemide dose was recently changed by cardiology to torsemide 80 mg in the morning and 60 mg in the evening, which she states she takes as directed.  Does not appear volume overloaded today no appreciable edema in her lower extremity. - BMP today - Continue torsemide 20 twice daily - Continue sevelamer - Continue torsemide 80 mg in the morning 60 - Continue close follow-up with serial BMPs

## 2021-03-21 NOTE — Assessment & Plan Note (Addendum)
Patient presents for medication refill for her pruritus.  She states that she has been on medication for several days and has been waking up in the middle of the night with itching across her body. she has not noted any rashes, bug bites, linear furrows on her hands or skin.  She states she has been on hydroxyzine 10 mg twice daily which is the one that appears to help her symptoms.On physical exam her skin is dry without evidence of rash although Excoriation marks are present. We will refill hydroxyzine today.  Patient was scheduled for a right upper quadrant ultrasound secondary to elevated alk phos which could be underlying her pruritus. - Refill hydroxyzine 10 mg twice daily - Remind patient of right upper quadrant ultrasound - Continue Eucerin cream twice daily

## 2021-03-21 NOTE — Assessment & Plan Note (Signed)
Patient presents to the clinic today with complaints of burning sensation with urination. The symptoms are gone ongoing for the past several days.  Has had this in the past with UTI.  Denies fevers, nausea, vomiting, constipation, or diarrhea. - Urinalysis and urine culture. - Follow-up culture sensitivities.

## 2021-03-23 LAB — CUP PACEART REMOTE DEVICE CHECK
Battery Remaining Longevity: 81 mo
Battery Remaining Percentage: 78 %
Battery Voltage: 2.99 V
Brady Statistic AP VP Percent: 0 %
Brady Statistic AP VS Percent: 0 %
Brady Statistic AS VP Percent: 100 %
Brady Statistic AS VS Percent: 0 %
Brady Statistic RA Percent Paced: 0 %
Brady Statistic RV Percent Paced: 99 %
Date Time Interrogation Session: 20220624182841
Implantable Lead Implant Date: 20110225
Implantable Lead Implant Date: 20110225
Implantable Lead Location: 753859
Implantable Lead Location: 753860
Implantable Pulse Generator Implant Date: 20200306
Lead Channel Impedance Value: 350 Ohm
Lead Channel Impedance Value: 380 Ohm
Lead Channel Pacing Threshold Amplitude: 1 V
Lead Channel Pacing Threshold Pulse Width: 0.4 ms
Lead Channel Sensing Intrinsic Amplitude: 1.9 mV
Lead Channel Sensing Intrinsic Amplitude: 10.5 mV
Lead Channel Setting Pacing Amplitude: 2.5 V
Lead Channel Setting Pacing Amplitude: 2.5 V
Lead Channel Setting Pacing Pulse Width: 0.4 ms
Lead Channel Setting Sensing Sensitivity: 4 mV
Pulse Gen Model: 2272
Pulse Gen Serial Number: 9114794

## 2021-03-23 LAB — URINE CULTURE

## 2021-03-25 ENCOUNTER — Telehealth: Payer: Self-pay | Admitting: Hospice

## 2021-03-25 ENCOUNTER — Other Ambulatory Visit: Payer: Self-pay | Admitting: Internal Medicine

## 2021-03-25 DIAGNOSIS — K529 Noninfective gastroenteritis and colitis, unspecified: Secondary | ICD-10-CM

## 2021-03-25 MED ORDER — LOPERAMIDE HCL 2 MG PO CAPS: ORAL_CAPSULE | ORAL | 0 refills | Status: DC

## 2021-03-25 NOTE — Telephone Encounter (Signed)
Refill Request   loperamide (IMODIUM) 2 MG capsule  [name] (Ph: [phone])

## 2021-03-25 NOTE — Telephone Encounter (Signed)
NP called patient and left a voicemail on need to set up visit appointment, with call back number

## 2021-03-26 ENCOUNTER — Other Ambulatory Visit: Payer: Self-pay | Admitting: Internal Medicine

## 2021-03-26 ENCOUNTER — Telehealth: Payer: Medicare Other

## 2021-03-26 DIAGNOSIS — K529 Noninfective gastroenteritis and colitis, unspecified: Secondary | ICD-10-CM

## 2021-03-28 ENCOUNTER — Other Ambulatory Visit (HOSPITAL_COMMUNITY): Payer: Self-pay | Admitting: Cardiology

## 2021-03-28 NOTE — Telephone Encounter (Signed)
CKD stage 4 not candidate for dialysis and Scr = 3.14 Hx of Atrial fibrillation previously managed with Xarelto  Per PCP recommendation for crCl < 25, will use eliquis 2.'5mg'$  twice daily.  Recommendation is consistent with available data for patient with advance kidney disease and not on dialysis. Also noted increased bleeding risk d/t thrombocytopenia (Plt = 75)

## 2021-03-28 NOTE — Telephone Encounter (Signed)
Pt requests refill of eliquis 2.'5mg'$  but may qualify for '5mg'$ . Routing to pharmd pool. 55f 65.9kg, scr 3.14 03/20/21, lovw/taylor 01/30/21

## 2021-04-02 ENCOUNTER — Encounter (HOSPITAL_COMMUNITY): Payer: Medicare Other

## 2021-04-05 ENCOUNTER — Telehealth: Payer: Self-pay | Admitting: *Deleted

## 2021-04-05 ENCOUNTER — Ambulatory Visit (INDEPENDENT_AMBULATORY_CARE_PROVIDER_SITE_OTHER): Payer: Medicare Other | Admitting: Student

## 2021-04-05 DIAGNOSIS — R112 Nausea with vomiting, unspecified: Secondary | ICD-10-CM | POA: Diagnosis not present

## 2021-04-05 NOTE — Telephone Encounter (Signed)
Donita, RN with [facility], called in from patient's home. States patient is c/o being "very [facility]" but states this is nothing new for her. Also, with cough, productive of thick, green sputum. Donita is auscultating expiratory wheezes, and  diminished breath sounds at RLL.   O2 sat 100 on RA. R 24 P 69  BP 128/90  Patient given tele appt for this PM.
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
## 2021-04-07 DIAGNOSIS — R112 Nausea with vomiting, unspecified: Secondary | ICD-10-CM | POA: Insufficient documentation

## 2021-04-07 NOTE — Assessment & Plan Note (Signed)
[name] [name] presents today via telephone to discuss nausea and vomiting that she has experienced over the last 3-4 days. States that her symptoms have been persistent and not changed in severity during this time. Describes vomiting as clear, non-bloody, and non-bilious occurring 1-2 times daily. She is currently able to eat ice but has not had solid food in roughly 48 hours. Denies sick contacts, constipation, diarrhea, abdominal pain.   Prior to our visit today, [name] [name]'s home health aide called [facility] concerned over dyspnea and cough that [name] [name] was having. I discussed this with [name] [name], who denies any worsening of chronic dyspnea and cough. States that she has been using her albuterol inhaler 1-2 times daily, but this has not changed recently. Further notes that she has chronic dry cough but intermittently has green phlegm. She denies fevers, chills, myalgias, chest pain, headaches.   [name] [name]'s home health aide took her vitals, revealing that she is afebrile, normotensive (128/80), HR 69, RR 24, and sating 100% on room air. Discussed with [name] [name] that is difficult to accurately assess her over the telephone, but there are multiple possibilities for these symptoms, including pneumonia, viral gastrointestinal illness, COVID-19. Of note, she has not been vaccinated for COVID-19. I encouraged [name] [name] to get tested and have given her the number to call and schedule COVID-19 testing with [facility]. In addition, I emphasized the importance of going to the Emergency Room if she feels dyspneic or continues to have poor po intake over the next 24-48 hours, especially given she has CKD IV. Encouraged her to continue to hydrate as well.  - Information given for COVID-19 testing - Encouraged COVID-19 vaccine in future appointments - Go to ED if dyspneic/continued poor po intake

## 2021-04-09 ENCOUNTER — Other Ambulatory Visit: Payer: Self-pay | Admitting: Student

## 2021-04-09 NOTE — Progress Notes (Signed)
Remote pacemaker transmission.   

## 2021-04-10 ENCOUNTER — Telehealth: Payer: Self-pay | Admitting: *Deleted
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
## 2021-04-12 ENCOUNTER — Telehealth: Payer: Medicare Other

## 2021-04-15 ENCOUNTER — Other Ambulatory Visit: Payer: Self-pay | Admitting: Internal Medicine

## 2021-04-15 DIAGNOSIS — K529 Noninfective gastroenteritis and colitis, unspecified: Secondary | ICD-10-CM

## 2021-04-15 NOTE — Progress Notes (Signed)
Internal Medicine Clinic Attending ? ?Case discussed with Dr. Braswell  At the time of the visit.  We reviewed the resident?s history and exam and pertinent patient test results.  I agree with the assessment, diagnosis, and plan of care documented in the resident?s note.  ?

## 2021-04-16 ENCOUNTER — Ambulatory Visit: Payer: Medicare Other

## 2021-04-16 ENCOUNTER — Telehealth: Payer: Self-pay | Admitting: Internal Medicine

## 2021-04-16 ENCOUNTER — Ambulatory Visit (INDEPENDENT_AMBULATORY_CARE_PROVIDER_SITE_OTHER): Payer: Medicare Other | Admitting: Internal Medicine

## 2021-04-16 VITALS — BP 141/73 | HR 70 | Temp 97.9°F | Wt 148.1 lb

## 2021-04-16 DIAGNOSIS — E1122 Type 2 diabetes mellitus with diabetic chronic kidney disease: Secondary | ICD-10-CM | POA: Diagnosis not present

## 2021-04-16 DIAGNOSIS — R601 Generalized edema: Secondary | ICD-10-CM | POA: Diagnosis not present

## 2021-04-16 DIAGNOSIS — I890 Lymphedema, not elsewhere classified: Secondary | ICD-10-CM

## 2021-04-16 DIAGNOSIS — Z794 Long term (current) use of insulin: Secondary | ICD-10-CM | POA: Diagnosis not present

## 2021-04-16 NOTE — Assessment & Plan Note (Signed)
Patient has 2+ pitting edema of the abdominal wall that she states is unchanged. This was noted in previous hospital admission notes as well.

## 2021-04-16 NOTE — Patient Instructions (Signed)
Visit Information   Goals Addressed             This Visit's Progress    Blood Pressure < 140/90       BP Readings from Last 3 Encounters:  04/16/21 (!) 141/73  03/20/21 (!) 154/86  03/04/21 (!) 142/76   Not meeting blood pressure targets     HEMOGLOBIN A1C < 7       Lab Results  Component Value Date   HGBA1C 5.0 11/12/2020        LDL CALC < 100       Lab Results  Component Value Date   CHOL 96 11/28/2020   HDL 58 11/28/2020   LDLCALC 32 11/28/2020   TRIG 32 11/28/2020   CHOLHDL 1.7 11/28/2020           The patient verbalized understanding of instructions, educational materials, and care plan provided today and declined offer to receive copy of patient instructions, educational materials, and care plan.   Telephone follow up appointment with care management team member scheduled for:05/14/21'@1PM'$   Johnney Killian, RN, BSN, CCM Care Management Coordinator [facility] Internal Medicine Phone: [phone] / Fax: [phone]

## 2021-04-16 NOTE — Assessment & Plan Note (Signed)
Patient is requesting new diabetic shoes. I have advised her to schedule an appointment at the next available time slot to have a diabetes check-up and to discuss her shoe needs.

## 2021-04-16 NOTE — Patient Instructions (Addendum)
Thank you for visiting with the Internal Medicine Clinic today. It was a pleasure to meet you!  Today we discussed the swelling that you have in your left arm. I am concerned that this could be due to problems with your lymphatic system and it's ability to drain fluid from that left arm.  At this time we are making a few calls to see who the best person for you to see would be. I will call you once we confirm this information and share with you our updated plan.  For now, please continue to take all over your medications as prescribed.  I am also recommending that you follow up for diabetes management and check-up to make sure that your condition is being managed appropriately. At this visit, we can address your need for new diabetic shoes as well.  If you have any questions or concerns, please call our clinic at [phone] between 9am-5pm and after hours call [phone] and ask for the internal medicine resident on call. If you feel you are having a medical emergency please call 911.  Dr. Marlou Sa
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
## 2021-04-16 NOTE — Telephone Encounter (Signed)
Spoke with patient to let her know that I have placed a referral to vein and vascular specialists to evaluate her L arm swelling. Patient voiced understanding and had no further questions.

## 2021-04-16 NOTE — Chronic Care Management (AMB) (Signed)
Care Management    RN Visit Note  04/16/2021 Name: [name] [name] MRN: [id number] DOB: [birth year]  Subjective: [name] [name] is a 70 y.o. year old female who is a primary care patient of [name] Payment, MD. The care management team was consulted for assistance with disease management and care coordination needs.    Engaged with patient face to face for follow up visit in response to provider referral for case management and/or care coordination services.   Consent to Services:   Ms. Ammon was given information about Care Management services today including:  Care Management services includes personalized support from designated clinical staff supervised by her physician, including individualized plan of care and coordination with other care providers 24/7 contact phone numbers for assistance for urgent and routine care needs. The patient may stop case management services at any time by phone call to the office staff.  Patient agreed to services and consent obtained.    Assessment: Patient continues to experience difficulty with upper extremity edema. See Care Plan below for interventions and patient self-care actives. Follow up Plan: Patient would like continued follow-up.  CCM RNCM will outreach the patient within the next 30 days.  Patient will call office if needed prior to next encounter : Review of patient past medical history, allergies, medications, health status, including review of consultants reports, laboratory and other test data, was performed as part of comprehensive evaluation and provision of chronic care management services.   SDOH (Social Determinants of Health) assessments and interventions performed:    Care Plan  Allergies  Allergen Reactions   Penicillins Itching    Did it involve swelling of the face/tongue/throat, SOB, or low BP? No Did it involve sudden or severe rash/hives, skin peeling, or any reaction on the inside of your mouth or nose? No Did  you need to seek medical attention at a hospital or doctor's office? No When did it last happen?      10 years If all above answers are "NO", may proceed with cephalosporin use.     Outpatient Encounter Medications as of 04/16/2021  Medication Sig   albuterol (PROVENTIL) (2.5 MG/3ML) 0.083% nebulizer solution Take 3 mLs (2.5 mg total) by nebulization every 6 (six) hours as needed for wheezing or shortness of breath.   apixaban (ELIQUIS) 2.5 MG TABS tablet Take 1 tablet (2.5 mg total) by mouth 2 (two) times daily.   atorvastatin (LIPITOR) 40 MG tablet Take 1 tablet (40 mg total) by mouth daily.   calcitRIOL (ROCALTROL) 0.5 MCG capsule Take 1 capsule (0.5 mcg total) by mouth daily.   empagliflozin (JARDIANCE) 10 MG TABS tablet Take 1 tablet (10 mg total) by mouth daily before breakfast.   gabapentin (NEURONTIN) 300 MG capsule Take 300 mg by mouth 2 (two) times daily.   hydrocerin (EUCERIN) CREA Apply 1 application topically 2 (two) times daily. (Patient taking differently: Apply 1 application topically 2 (two) times daily as needed (dry skin).)   hydrOXYzine (ATARAX/VISTARIL) 10 MG tablet Take 1 tablet (10 mg total) by mouth 2 (two) times daily as needed for itching.   loperamide (IMODIUM) 2 MG capsule Take 1 capsule(2 mg total) by mouth 4 (four) times daily as needed for diarrhea or loose stools.   senna-docusate (SENOKOT-S) 8.6-50 MG tablet Take 1 tablet by mouth at bedtime as needed for mild constipation.   sevelamer carbonate (RENVELA) 800 MG tablet Take 2 tablets (1,600 mg total) by mouth 3 (three) times daily with meals.   torsemide (DEMADEX)  20 MG tablet Take 1 tablet (20 mg total) by mouth 2 (two) times daily. Take 80 mg (4 tablets) in the morning, and 60 mg (3 tablets) in the evening. Or as directed by heart failure clinic   Travoprost, BAK Free, (TRAVATAN) 0.004 % SOLN ophthalmic solution Place 1 drop into both eyes at bedtime.   No facility-administered encounter medications on file as  of 04/16/2021.    Patient Active Problem List   Diagnosis Date Noted   Nausea and vomiting 04/07/2021   Anasarca    Deficit in activities of daily living (ADL) 12/19/2020   AKI (acute kidney injury) ([place])    Hypervolemia 11/21/2020   Hyperthyroidism 11/14/2020   Acute cholecystitis 11/11/2020   Callus 11/07/2020   Heart failure with reduced ejection fraction ([place]) 10/05/2020   Paroxysmal atrial fibrillation ([place]) 08/30/2020   Secondary hypercoagulable state ([place]) 08/30/2020   Diabetes mellitus ([place]) 07/13/2020   Acute exacerbation of CHF (congestive heart failure) ([place]) 06/20/2020   Pruritus 06/20/2020   Hx of BKA, left ([place]) 04/20/2019   Chronic cough 08/03/2018   Status post reverse total shoulder replacement, left    Neck pain, chronic 10/20/2017   Cognitive impairment 08/28/2016   Cardiomyopathy ([place])    Vitamin D deficiency 06/01/2014   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[place]) 03/11/2013   Preventative health care 02/09/2013   Below knee amputation status, left 12/20/2012   Thrombocytopenia ([place]) 11/04/2012   CKD stage 4 secondary to hypertension ([place]) 11/04/2012   Normocytic anemia 11/04/2012   CAD (coronary artery disease)--hx of arrest s/p pacemaker 11/04/2012   Chronic diarrhea 08/25/2012   Complete heart block ([place])    PACEMAKER-[facility] 03/15/2010   HYPERPARATHYROIDISM, SECONDARY 01/16/2010   Hyperlipidemia 11/23/2007   DIABETIC  RETINOPATHY 08/12/2006   DIABETIC PERIPHERAL NEUROPATHY 08/12/2006   Essential hypertension 08/12/2006   PERIPHERAL VASCULAR DISEASE 08/12/2006    Conditions to be addressed/monitored: HTN and DMII  Care Plan : CCM RN - Improve Medication Taking Behavior  Updates made by Johnney Killian, RN since 04/16/2021 12:00 AM     Problem: Medication Adherence ([facility])      Goal: Medication Adherence Improved   Start Date: 09/19/2020  Expected End Date: 06/28/2021  Recent Progress: Not on track  Priority: High  Note:   Current Barriers:  Patient seen in clinic s/p fall at home and increased left upper extremity pitting edema.  Non-adherence to prescribed medication regimen Does not adhere to prescribed medication regimen-   Nurse Case Manager Clinical Goal(s):  Over the next 30-60 days, patient will work with home health RN and health care team to improve medication taking behavior  Interventions:  1:1 collaboration with [name] Payment, MD regarding development and update of comprehensive plan of care as evidenced by provider attestation and co-signature Inter-disciplinary care team collaboration (see longitudinal plan of care) Reminded patient to bring medications and/or list of current medications she is taking with her to all provider appointments Patient Goals/Self-Care Activities Over the next 30-60 days, patient will:  - Patient will self administer medications as prescribed Patient will attend all scheduled provider appointments Patient will call pharmacy for medication refills Meet with pharmacist to address medication taking barriers  Follow Up Plan: The care management team will reach out to the patient again over the next 30 days.         Care Plan : Diabetes Type 2  Updates made by Johnney Killian, RN since 04/16/2021 12:00 AM      Long-Range Goal: Disease Progression Prevented or  Minimized   Start Date: 04/16/2021  This Visit's Progress: Not on track  Priority: High  Note:   Objective: Met with patient in clinic to discuss her progress and needs.  Patient is requesting new diabetic shoes as her shoes are worn out.  Patient's last diabetic shoes were received in 2018 from [facility].  Discussed with MD and patient will need to come to clinic to have an appointment to discuss her diabetic needs and new diabetic shoes.   Lab Results  Component Value Date   HGBA1C 5.0 11/12/2020   Lab Results  Component Value Date   CREATININE 3.14 (H) 03/20/2021   CREATININE 3.71 (H) 03/04/2021    CREATININE 3.63 (H) 02/18/2021   Lab Results  Component Value Date   EGFR 16 (L) 03/20/2021   Current Barriers:  Knowledge Deficits related to basic Diabetes pathophysiology and self care/management Unable to perform ADLs independently- Case Manager Clinical Goal(s):  patient will demonstrate improved adherence to prescribed treatment plan for diabetes self care/management as evidenced by: contacting provider for new or worsened symptoms or questions Interventions:  Collaboration with [name] Payment, MD regarding development and update of comprehensive plan of care as evidenced by provider attestation and co-signature Inter-disciplinary care team collaboration (see longitudinal plan of care) Reviewed scheduled/upcoming provider appointments including: Follow up appt with MD regarding DM Self-Care Activities - Attends all scheduled provider appointments Patient Goals: - set a realistic goal - keep appointment with eye doctor - check feet daily for cuts, sores or redness - trim toenails straight across - wash and dry feet carefully every day - wear comfortable, cotton socks - wear comfortable, well-fitting shoes Follow Up Plan: Telephone follow up appointment with care management team member scheduled for: 30 days       Plan: Telephone follow up appointment with care management team member scheduled for:  38 days  Johnney Killian, RN, BSN, CCM Care Management Coordinator [facility] Internal Medicine Phone: [phone] / Fax: [phone]

## 2021-04-16 NOTE — Assessment & Plan Note (Addendum)
Patient does not have history of breast or armpit surgery but does have a surgical history of L shoulder replacement. Recent mammogram noted swelling of the L breast as well. RUE [place] 01/2021 was negative for DVT. She has been noted to have this swelling and edema during prior hospital stays that improved with diuresis though the patient denies that it ever fully went away. She reports taking all medications as prescribed. - Referral to vein and vascular specialist

## 2021-04-19 NOTE — Telephone Encounter (Signed)
Refill Request-Pt states she has been trying to get the following medication for 2 weeks. Pt states she was  seen on 04/16/2021 and still has not been able to get the following medication:   loperamide (IMODIUM) 2 MG capsule  [place], [place] (Ph: [phone])

## 2021-04-22 ENCOUNTER — Encounter (HOSPITAL_COMMUNITY): Payer: Medicare Other

## 2021-04-30 ENCOUNTER — Inpatient Hospital Stay (HOSPITAL_COMMUNITY)
Admission: EM | Admit: 2021-04-30 | Discharge: 2021-05-30 | DRG: 291 | Disposition: E | Payer: Medicare Other | Attending: Internal Medicine | Admitting: Internal Medicine

## 2021-04-30 ENCOUNTER — Emergency Department (HOSPITAL_BASED_OUTPATIENT_CLINIC_OR_DEPARTMENT_OTHER): Payer: Medicare Other

## 2021-04-30 ENCOUNTER — Emergency Department (HOSPITAL_COMMUNITY): Payer: Medicare Other

## 2021-04-30 DIAGNOSIS — Z950 Presence of cardiac pacemaker: Secondary | ICD-10-CM | POA: Diagnosis not present

## 2021-04-30 DIAGNOSIS — I5084 End stage heart failure: Secondary | ICD-10-CM | POA: Diagnosis not present

## 2021-04-30 DIAGNOSIS — K648 Other hemorrhoids: Secondary | ICD-10-CM | POA: Diagnosis not present

## 2021-04-30 DIAGNOSIS — Z89512 Acquired absence of left leg below knee: Secondary | ICD-10-CM

## 2021-04-30 DIAGNOSIS — Z20822 Contact with and (suspected) exposure to covid-19: Secondary | ICD-10-CM | POA: Diagnosis present

## 2021-04-30 DIAGNOSIS — E785 Hyperlipidemia, unspecified: Secondary | ICD-10-CM | POA: Diagnosis present

## 2021-04-30 DIAGNOSIS — E119 Type 2 diabetes mellitus without complications: Secondary | ICD-10-CM

## 2021-04-30 DIAGNOSIS — I2510 Atherosclerotic heart disease of native coronary artery without angina pectoris: Secondary | ICD-10-CM | POA: Diagnosis not present

## 2021-04-30 DIAGNOSIS — Z96649 Presence of unspecified artificial hip joint: Secondary | ICD-10-CM | POA: Diagnosis present

## 2021-04-30 DIAGNOSIS — I442 Atrioventricular block, complete: Secondary | ICD-10-CM | POA: Diagnosis not present

## 2021-04-30 DIAGNOSIS — I071 Rheumatic tricuspid insufficiency: Secondary | ICD-10-CM | POA: Diagnosis present

## 2021-04-30 DIAGNOSIS — K7460 Unspecified cirrhosis of liver: Secondary | ICD-10-CM | POA: Diagnosis present

## 2021-04-30 DIAGNOSIS — Z823 Family history of stroke: Secondary | ICD-10-CM

## 2021-04-30 DIAGNOSIS — Z515 Encounter for palliative care: Secondary | ICD-10-CM

## 2021-04-30 DIAGNOSIS — R609 Edema, unspecified: Secondary | ICD-10-CM

## 2021-04-30 DIAGNOSIS — I480 Paroxysmal atrial fibrillation: Secondary | ICD-10-CM | POA: Diagnosis present

## 2021-04-30 DIAGNOSIS — I129 Hypertensive chronic kidney disease with stage 1 through stage 4 chronic kidney disease, or unspecified chronic kidney disease: Secondary | ICD-10-CM | POA: Diagnosis present

## 2021-04-30 DIAGNOSIS — R64 Cachexia: Secondary | ICD-10-CM | POA: Diagnosis present

## 2021-04-30 DIAGNOSIS — Z833 Family history of diabetes mellitus: Secondary | ICD-10-CM

## 2021-04-30 DIAGNOSIS — R109 Unspecified abdominal pain: Secondary | ICD-10-CM

## 2021-04-30 DIAGNOSIS — I252 Old myocardial infarction: Secondary | ICD-10-CM

## 2021-04-30 DIAGNOSIS — R570 Cardiogenic shock: Secondary | ICD-10-CM | POA: Diagnosis present

## 2021-04-30 DIAGNOSIS — E1152 Type 2 diabetes mellitus with diabetic peripheral angiopathy with gangrene: Secondary | ICD-10-CM | POA: Diagnosis present

## 2021-04-30 DIAGNOSIS — R188 Other ascites: Secondary | ICD-10-CM | POA: Diagnosis present

## 2021-04-30 DIAGNOSIS — I509 Heart failure, unspecified: Principal | ICD-10-CM

## 2021-04-30 DIAGNOSIS — Z96612 Presence of left artificial shoulder joint: Secondary | ICD-10-CM | POA: Diagnosis present

## 2021-04-30 DIAGNOSIS — F17210 Nicotine dependence, cigarettes, uncomplicated: Secondary | ICD-10-CM | POA: Diagnosis present

## 2021-04-30 DIAGNOSIS — E7800 Pure hypercholesterolemia, unspecified: Secondary | ICD-10-CM | POA: Diagnosis present

## 2021-04-30 DIAGNOSIS — Z66 Do not resuscitate: Secondary | ICD-10-CM | POA: Diagnosis not present

## 2021-04-30 DIAGNOSIS — N186 End stage renal disease: Secondary | ICD-10-CM | POA: Diagnosis not present

## 2021-04-30 DIAGNOSIS — R112 Nausea with vomiting, unspecified: Secondary | ICD-10-CM | POA: Diagnosis present

## 2021-04-30 DIAGNOSIS — I5023 Acute on chronic systolic (congestive) heart failure: Secondary | ICD-10-CM | POA: Diagnosis not present

## 2021-04-30 DIAGNOSIS — Z7984 Long term (current) use of oral hypoglycemic drugs: Secondary | ICD-10-CM

## 2021-04-30 DIAGNOSIS — I70219 Atherosclerosis of native arteries of extremities with intermittent claudication, unspecified extremity: Secondary | ICD-10-CM | POA: Diagnosis not present

## 2021-04-30 DIAGNOSIS — K529 Noninfective gastroenteritis and colitis, unspecified: Secondary | ICD-10-CM | POA: Diagnosis present

## 2021-04-30 DIAGNOSIS — Z79899 Other long term (current) drug therapy: Secondary | ICD-10-CM

## 2021-04-30 DIAGNOSIS — I132 Hypertensive heart and chronic kidney disease with heart failure and with stage 5 chronic kidney disease, or end stage renal disease: Principal | ICD-10-CM | POA: Diagnosis present

## 2021-04-30 DIAGNOSIS — R339 Retention of urine, unspecified: Secondary | ICD-10-CM | POA: Diagnosis not present

## 2021-04-30 DIAGNOSIS — Z993 Dependence on wheelchair: Secondary | ICD-10-CM

## 2021-04-30 DIAGNOSIS — I10 Essential (primary) hypertension: Secondary | ICD-10-CM | POA: Diagnosis present

## 2021-04-30 DIAGNOSIS — N179 Acute kidney failure, unspecified: Secondary | ICD-10-CM | POA: Diagnosis present

## 2021-04-30 DIAGNOSIS — Z8674 Personal history of sudden cardiac arrest: Secondary | ICD-10-CM

## 2021-04-30 DIAGNOSIS — M7989 Other specified soft tissue disorders: Secondary | ICD-10-CM

## 2021-04-30 DIAGNOSIS — Z8719 Personal history of other diseases of the digestive system: Secondary | ICD-10-CM

## 2021-04-30 DIAGNOSIS — E1122 Type 2 diabetes mellitus with diabetic chronic kidney disease: Secondary | ICD-10-CM | POA: Diagnosis present

## 2021-04-30 DIAGNOSIS — K8020 Calculus of gallbladder without cholecystitis without obstruction: Secondary | ICD-10-CM | POA: Diagnosis present

## 2021-04-30 DIAGNOSIS — Z90710 Acquired absence of both cervix and uterus: Secondary | ICD-10-CM

## 2021-04-30 DIAGNOSIS — E872 Acidosis: Secondary | ICD-10-CM | POA: Diagnosis not present

## 2021-04-30 DIAGNOSIS — I429 Cardiomyopathy, unspecified: Secondary | ICD-10-CM | POA: Diagnosis present

## 2021-04-30 DIAGNOSIS — Z96642 Presence of left artificial hip joint: Secondary | ICD-10-CM | POA: Diagnosis present

## 2021-04-30 DIAGNOSIS — E1140 Type 2 diabetes mellitus with diabetic neuropathy, unspecified: Secondary | ICD-10-CM | POA: Diagnosis not present

## 2021-04-30 DIAGNOSIS — Z880 Allergy status to penicillin: Secondary | ICD-10-CM

## 2021-04-30 DIAGNOSIS — Z87442 Personal history of urinary calculi: Secondary | ICD-10-CM

## 2021-04-30 DIAGNOSIS — Z7901 Long term (current) use of anticoagulants: Secondary | ICD-10-CM

## 2021-04-30 DIAGNOSIS — Z8249 Family history of ischemic heart disease and other diseases of the circulatory system: Secondary | ICD-10-CM

## 2021-04-30 DIAGNOSIS — N184 Chronic kidney disease, stage 4 (severe): Secondary | ICD-10-CM | POA: Diagnosis present

## 2021-04-30 DIAGNOSIS — K828 Other specified diseases of gallbladder: Secondary | ICD-10-CM | POA: Diagnosis present

## 2021-04-30 LAB — RESP PANEL BY RT-PCR (FLU A&B, COVID) ARPGX2
Influenza A by PCR: NEGATIVE
Influenza B by PCR: NEGATIVE
SARS Coronavirus 2 by RT PCR: NEGATIVE

## 2021-04-30 LAB — COMPREHENSIVE METABOLIC PANEL
ALT: 12 U/L (ref 0–44)
AST: 21 U/L (ref 15–41)
Albumin: 3.5 g/dL (ref 3.5–5.0)
Alkaline Phosphatase: 80 U/L (ref 38–126)
Anion gap: 16 — ABNORMAL HIGH (ref 5–15)
BUN: 51 mg/dL — ABNORMAL HIGH (ref 8–23)
CO2: 15 mmol/L — ABNORMAL LOW (ref 22–32)
Calcium: 9.7 mg/dL (ref 8.9–10.3)
Chloride: 114 mmol/L — ABNORMAL HIGH (ref 98–111)
Creatinine, Ser: 3.77 mg/dL — ABNORMAL HIGH (ref 0.44–1.00)
GFR, Estimated: 12 mL/min — ABNORMAL LOW (ref >60)
Glucose, Bld: 101 mg/dL — ABNORMAL HIGH (ref 70–99)
Potassium: 4.5 mmol/L (ref 3.5–5.1)
Sodium: 145 mmol/L (ref 135–145)
Total Bilirubin: 4.6 mg/dL — ABNORMAL HIGH (ref 0.3–1.2)
Total Protein: 6.9 g/dL (ref 6.5–8.1)

## 2021-04-30 LAB — TROPONIN I (HIGH SENSITIVITY)
Troponin I (High Sensitivity): 31 ng/L — ABNORMAL HIGH (ref <18)
Troponin I (High Sensitivity): 24 ng/L — ABNORMAL HIGH (ref <18)

## 2021-04-30 LAB — CBC WITH DIFFERENTIAL/PLATELET
Abs Immature Granulocytes: 0.05 10*3/uL (ref 0.00–0.07)
Basophils Absolute: 0 10*3/uL (ref 0.0–0.1)
Basophils Relative: 0 %
Eosinophils Absolute: 0 10*3/uL (ref 0.0–0.5)
Eosinophils Relative: 0 %
HCT: 40.3 % (ref 36.0–46.0)
Hemoglobin: 13.6 g/dL (ref 12.0–15.0)
Immature Granulocytes: 0 %
Lymphocytes Relative: 4 %
Lymphs Abs: 0.4 10*3/uL — ABNORMAL LOW (ref 0.7–4.0)
MCH: 30.9 pg (ref 26.0–34.0)
MCHC: 33.7 g/dL (ref 30.0–36.0)
MCV: 91.6 fL (ref 80.0–100.0)
Monocytes Absolute: 0.8 10*3/uL (ref 0.1–1.0)
Monocytes Relative: 7 %
Neutro Abs: 10.2 10*3/uL — ABNORMAL HIGH (ref 1.7–7.7)
Neutrophils Relative %: 89 %
Platelets: 92 10*3/uL — ABNORMAL LOW (ref 150–400)
RBC: 4.4 MIL/uL (ref 3.87–5.11)
RDW: 19.8 % — ABNORMAL HIGH (ref 11.5–15.5)
WBC: 11.5 10*3/uL — ABNORMAL HIGH (ref 4.0–10.5)
nRBC: 0.9 % — ABNORMAL HIGH (ref 0.0–0.2)

## 2021-04-30 LAB — POC OCCULT BLOOD, ED: Fecal Occult Bld: POSITIVE — AB

## 2021-04-30 LAB — CBG MONITORING, ED: Glucose-Capillary: 108 mg/dL — ABNORMAL HIGH (ref 70–99)

## 2021-04-30 LAB — BRAIN NATRIURETIC PEPTIDE: B Natriuretic Peptide: >4500 pg/mL — ABNORMAL HIGH (ref 0.0–100.0)

## 2021-04-30 MED ORDER — HYDROCERIN EX CREA
1.0000 "application " | TOPICAL_CREAM | Freq: Two times a day (BID) | CUTANEOUS | Status: DC
  Filled 2021-04-30: qty 113

## 2021-04-30 MED ORDER — APIXABAN 2.5 MG PO TABS
2.5000 mg | ORAL_TABLET | Freq: Two times a day (BID) | ORAL | Status: DC
  Filled 2021-04-30 (×2): qty 1

## 2021-04-30 MED ORDER — HEPARIN SODIUM (PORCINE) 5000 UNIT/ML IJ SOLN: 5000.0000 [IU] | Freq: Three times a day (TID) | INTRAMUSCULAR | Status: DC

## 2021-04-30 MED ORDER — GABAPENTIN 300 MG PO CAPS
300.0000 mg | ORAL_CAPSULE | Freq: Two times a day (BID) | ORAL | Status: DC
  Administered 2021-04-30 – 2021-05-01 (×2): 300 mg via ORAL
  Filled 2021-04-30 (×2): qty 1

## 2021-04-30 MED ORDER — POLYETHYLENE GLYCOL 3350 17 G PO PACK: 17.0000 g | PACK | Freq: Every day | ORAL | Status: DC | PRN

## 2021-04-30 MED ORDER — SENNOSIDES-DOCUSATE SODIUM 8.6-50 MG PO TABS: 1.0000 | ORAL_TABLET | Freq: Every evening | ORAL | Status: DC | PRN

## 2021-04-30 MED ORDER — METOLAZONE 2.5 MG PO TABS
2.5000 mg | ORAL_TABLET | Freq: Once | ORAL | Status: AC
  Administered 2021-05-01: 2.5 mg via ORAL
  Filled 2021-04-30: qty 1

## 2021-04-30 MED ORDER — FUROSEMIDE 10 MG/ML IJ SOLN
80.0000 mg | Freq: Two times a day (BID) | INTRAMUSCULAR | Status: DC
  Administered 2021-05-01: 80 mg via INTRAVENOUS
  Filled 2021-04-30: qty 8

## 2021-04-30 MED ORDER — ACETAMINOPHEN 325 MG PO TABS
650.0000 mg | ORAL_TABLET | Freq: Four times a day (QID) | ORAL | Status: DC | PRN
Start: 2021-04-30 — End: 2021-05-01
  Administered 2021-05-01: 650 mg via ORAL
  Filled 2021-04-30: qty 2

## 2021-04-30 MED ORDER — FUROSEMIDE 10 MG/ML IJ SOLN
80.0000 mg | Freq: Once | INTRAMUSCULAR | Status: AC
  Administered 2021-04-30: 80 mg via INTRAVENOUS
  Filled 2021-04-30: qty 8

## 2021-04-30 MED ORDER — ATORVASTATIN CALCIUM 40 MG PO TABS
40.0000 mg | ORAL_TABLET | Freq: Every day | ORAL | Status: DC
  Administered 2021-05-01: 40 mg via ORAL
  Filled 2021-04-30: qty 1

## 2021-04-30 MED ORDER — ALBUTEROL SULFATE (2.5 MG/3ML) 0.083% IN NEBU
2.5000 mg | INHALATION_SOLUTION | Freq: Four times a day (QID) | RESPIRATORY_TRACT | Status: DC | PRN
  Administered 2021-04-30: 2.5 mg via RESPIRATORY_TRACT
  Filled 2021-04-30: qty 3

## 2021-04-30 MED ORDER — CALCITRIOL 0.5 MCG PO CAPS
0.5000 ug | ORAL_CAPSULE | Freq: Every day | ORAL | Status: DC
  Administered 2021-05-01: 0.5 ug via ORAL
  Filled 2021-04-30: qty 1

## 2021-04-30 MED ORDER — EMPAGLIFLOZIN 10 MG PO TABS
10.0000 mg | ORAL_TABLET | Freq: Every day | ORAL | Status: DC
  Administered 2021-05-01: 10 mg via ORAL
  Filled 2021-04-30: qty 1

## 2021-04-30 MED ORDER — HYDROXYZINE HCL 10 MG PO TABS
10.0000 mg | ORAL_TABLET | Freq: Two times a day (BID) | ORAL | Status: DC | PRN
  Filled 2021-04-30: qty 1

## 2021-04-30 MED ORDER — ACETAMINOPHEN 650 MG RE SUPP: 650.0000 mg | Freq: Four times a day (QID) | RECTAL | Status: DC | PRN

## 2021-04-30 MED ORDER — SEVELAMER CARBONATE 800 MG PO TABS
1600.0000 mg | ORAL_TABLET | Freq: Three times a day (TID) | ORAL | Status: DC
  Administered 2021-05-01 (×2): 1600 mg via ORAL
  Filled 2021-04-30 (×2): qty 2

## 2021-04-30 NOTE — ED Notes (Signed)
After gabapentin given, pt had one episode of emesis. Pt started to c/o sob, RN placed pt on 2L o2 via [place] for comfort. Pt then became bradycardic to 35 BPM. Admitting team paged.

## 2021-04-30 NOTE — ED Notes (Signed)
Admitting at bedside 

## 2021-04-30 NOTE — Progress Notes (Signed)
Upper extremity venous has been completed.   Preliminary results in CV Proc.   [name] [name] 05/05/2021 5:20 PM
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
## 2021-04-30 NOTE — ED Notes (Signed)
RN spoke w/ admitting team, instructed to give albuterol nebulizer and attempt to give metolazone later once pt wasn't nauseous.

## 2021-04-30 NOTE — ED Triage Notes (Signed)
Pt presents today via EMS from home where she lives by herself with c/o SOB, abd pain, vomiting and black stool x3 days. Pt also has swelling to left arm.

## 2021-04-30 NOTE — ED Notes (Signed)
Pt c/o of abdominal cramping, pt abd hard and distended, admitting paged. No new orders given.

## 2021-04-30 NOTE — ED Notes (Signed)
Accepted pt, pt appears to be short of breath and states she is has "miserable pain" on the right side.  Dr Lisabeth Devoid called, requested she come bedside.  Pt continues to be nauseas and not producting urine.  Bladder scan done, poor reading b/c of pt positioning however did read 417m.  Suspect much more due to distention.  Pt continues to be nauseas.

## 2021-05-01 ENCOUNTER — Inpatient Hospital Stay (HOSPITAL_COMMUNITY): Payer: Medicare Other

## 2021-05-01 ENCOUNTER — Ambulatory Visit: Payer: Medicare Other | Admitting: Podiatry

## 2021-05-01 DIAGNOSIS — R570 Cardiogenic shock: Secondary | ICD-10-CM | POA: Diagnosis not present

## 2021-05-01 DIAGNOSIS — I5023 Acute on chronic systolic (congestive) heart failure: Secondary | ICD-10-CM | POA: Diagnosis not present

## 2021-05-01 DIAGNOSIS — R0600 Dyspnea, unspecified: Secondary | ICD-10-CM

## 2021-05-01 DIAGNOSIS — N179 Acute kidney failure, unspecified: Secondary | ICD-10-CM

## 2021-05-01 DIAGNOSIS — I509 Heart failure, unspecified: Secondary | ICD-10-CM

## 2021-05-01 DIAGNOSIS — Z7189 Other specified counseling: Secondary | ICD-10-CM | POA: Diagnosis not present

## 2021-05-01 DIAGNOSIS — E119 Type 2 diabetes mellitus without complications: Secondary | ICD-10-CM | POA: Diagnosis not present

## 2021-05-01 DIAGNOSIS — R208 Other disturbances of skin sensation: Secondary | ICD-10-CM | POA: Diagnosis not present

## 2021-05-01 DIAGNOSIS — Z515 Encounter for palliative care: Secondary | ICD-10-CM | POA: Diagnosis not present

## 2021-05-01 DIAGNOSIS — I132 Hypertensive heart and chronic kidney disease with heart failure and with stage 5 chronic kidney disease, or end stage renal disease: Secondary | ICD-10-CM | POA: Diagnosis not present

## 2021-05-01 DIAGNOSIS — R1084 Generalized abdominal pain: Secondary | ICD-10-CM

## 2021-05-01 DIAGNOSIS — R109 Unspecified abdominal pain: Secondary | ICD-10-CM | POA: Diagnosis not present

## 2021-05-01 DIAGNOSIS — I739 Peripheral vascular disease, unspecified: Secondary | ICD-10-CM | POA: Diagnosis not present

## 2021-05-01 LAB — BASIC METABOLIC PANEL
Anion gap: 17 — ABNORMAL HIGH (ref 5–15)
BUN: 53 mg/dL — ABNORMAL HIGH (ref 8–23)
CO2: 13 mmol/L — ABNORMAL LOW (ref 22–32)
Calcium: 9.4 mg/dL (ref 8.9–10.3)
Chloride: 115 mmol/L — ABNORMAL HIGH (ref 98–111)
Creatinine, Ser: 3.78 mg/dL — ABNORMAL HIGH (ref 0.44–1.00)
GFR, Estimated: 12 mL/min — ABNORMAL LOW (ref >60)
Glucose, Bld: 96 mg/dL (ref 70–99)
Potassium: 4.9 mmol/L (ref 3.5–5.1)
Sodium: 145 mmol/L (ref 135–145)

## 2021-05-01 LAB — URINALYSIS, ROUTINE W REFLEX MICROSCOPIC
Bilirubin Urine: NEGATIVE
Glucose, UA: NEGATIVE mg/dL
Ketones, ur: NEGATIVE mg/dL
Leukocytes,Ua: NEGATIVE
Nitrite: NEGATIVE
Protein, ur: 100 mg/dL — AB
Specific Gravity, Urine: 1.013 (ref 1.005–1.030)
pH: 5 (ref 5.0–8.0)

## 2021-05-01 LAB — CBC
HCT: 41.8 % (ref 36.0–46.0)
Hemoglobin: 14.2 g/dL (ref 12.0–15.0)
MCH: 31.1 pg (ref 26.0–34.0)
MCHC: 34 g/dL (ref 30.0–36.0)
MCV: 91.7 fL (ref 80.0–100.0)
Platelets: 89 10*3/uL — ABNORMAL LOW (ref 150–400)
RBC: 4.56 MIL/uL (ref 3.87–5.11)
RDW: 20 % — ABNORMAL HIGH (ref 11.5–15.5)
WBC: 13.3 10*3/uL — ABNORMAL HIGH (ref 4.0–10.5)
nRBC: 0.8 % — ABNORMAL HIGH (ref 0.0–0.2)

## 2021-05-01 LAB — HEMOGLOBIN A1C
Hgb A1c MFr Bld: 4.6 % — ABNORMAL LOW (ref 4.8–5.6)
Mean Plasma Glucose: 85.32 mg/dL

## 2021-05-01 LAB — LACTIC ACID, PLASMA: Lactic Acid, Venous: 3.4 mmol/L (ref 0.5–1.9)

## 2021-05-01 LAB — CBG MONITORING, ED: Glucose-Capillary: 106 mg/dL — ABNORMAL HIGH (ref 70–99)

## 2021-05-01 LAB — MAGNESIUM
Magnesium: 1.5 mg/dL — ABNORMAL LOW (ref 1.7–2.4)
Magnesium: 1.9 mg/dL (ref 1.7–2.4)

## 2021-05-01 MED ORDER — METOLAZONE 2.5 MG PO TABS
2.5000 mg | ORAL_TABLET | Freq: Once | ORAL | Status: AC
  Administered 2021-05-01: 2.5 mg via ORAL
  Filled 2021-05-01: qty 1

## 2021-05-01 MED ORDER — INSULIN ASPART 100 UNIT/ML IJ SOLN: 0.0000 [IU] | Freq: Every day | INTRAMUSCULAR | Status: DC

## 2021-05-01 MED ORDER — PANTOPRAZOLE SODIUM 40 MG IV SOLR
40.0000 mg | INTRAVENOUS | Status: DC
  Administered 2021-05-01: 40 mg via INTRAVENOUS
  Filled 2021-05-01: qty 40

## 2021-05-01 MED ORDER — HALOPERIDOL LACTATE 5 MG/ML IJ SOLN: 0.5000 mg | INTRAMUSCULAR | Status: DC | PRN

## 2021-05-01 MED ORDER — INSULIN ASPART 100 UNIT/ML IJ SOLN: 0.0000 [IU] | Freq: Three times a day (TID) | INTRAMUSCULAR | Status: DC

## 2021-05-01 MED ORDER — LORAZEPAM 2 MG/ML IJ SOLN: 0.5000 mg | INTRAMUSCULAR | Status: DC | PRN

## 2021-05-01 MED ORDER — GLYCOPYRROLATE 0.2 MG/ML IJ SOLN: 0.3000 mg | INTRAMUSCULAR | Status: DC | PRN

## 2021-05-01 MED ORDER — MAGNESIUM SULFATE 2 GM/50ML IV SOLN
2.0000 g | Freq: Once | INTRAVENOUS | Status: AC
  Administered 2021-05-01: 2 g via INTRAVENOUS
  Filled 2021-05-01: qty 50

## 2021-05-01 MED ORDER — INSULIN ASPART 100 UNIT/ML IJ SOLN: 4.0000 [IU] | Freq: Three times a day (TID) | INTRAMUSCULAR | Status: DC

## 2021-05-01 MED ORDER — INSULIN DETEMIR 100 UNIT/ML ~~LOC~~ SOLN: 10.0000 [IU] | Freq: Every day | SUBCUTANEOUS | Status: DC

## 2021-05-01 MED ORDER — SODIUM CHLORIDE 0.9 % IV SOLN
0.5000 mg/h | INTRAVENOUS | Status: DC
  Administered 2021-05-01: 0.5 mg/h via INTRAVENOUS
  Filled 2021-05-01: qty 2.5

## 2021-05-01 NOTE — ED Notes (Signed)
Pt continues to sleep. NAD noted. 
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
## 2021-05-01 NOTE — Progress Notes (Signed)

## 2021-05-01 NOTE — Progress Notes (Signed)
Subjective: I seen and evaluated [name] at bedside.  [name] [name] was very somnolent and was unable to answer any questions.  She would moan and groan on physical exam during examination of her right leg.   Afternoon- I seen and evaluated [name] at bedside.  Vitals: Unstable.  [name] [name] was answering questions and shallow breathing.  She requests her close family and friends to be contacted.  Palliative care team present.  They agree they will be taking over.  Objective:  Vital signs in last 24 hours: Vitals:   05/01/21 1045 05/01/21 1245 05/01/21 1300 05/01/21 1330  BP: (!) 153/91 (!) 153/83 (!) 154/81 (!) 153/82  Pulse: 68 70 70 69  Resp: '12 15 13 '$ (!) 24  Temp:      TempSrc:      SpO2: 100% 98% 98% 98%   Physical Exam Exam conducted with a chaperone present.  Constitutional:      Comments: somnolent  HENT:     Head: Normocephalic and atraumatic.  Neck:     Vascular: JVD present.  Cardiovascular:     Rate and Rhythm: Regular rhythm.     Pulses:          Dorsalis pedis pulses are 0 on the right side.       Posterior tibial pulses are 0 on the right side.     Heart sounds: Murmur heard.  Systolic murmur is present with a grade of 2/6.  Pulmonary:     Effort: Pulmonary effort is normal. No retractions.     Breath sounds: Examination of the right-upper field reveals wheezing and rhonchi. Examination of the left-upper field reveals wheezing and rhonchi. Wheezing and rhonchi present.  Chest:  Breasts:    Left: Swelling and skin change present.     Comments: Right breast erythematous and warm to touch Abdominal:     General: Bowel sounds are decreased.     Palpations: Abdomen is rigid.  Musculoskeletal:     Left upper arm: Edema (rigidity) present.     Right lower leg: No edema.     Comments: Right lower extremity below the knee was red, dark blue in color. Cold to touch and pulseless.       Left Lower Extremity: Left leg is amputated below knee.  Feet:      Right foot:     Toenail Condition: Right toenails are abnormally thick.      Assessment/Plan:  Active Problems:   Hyperlipidemia   Essential hypertension   Chronic diarrhea   CKD stage 4 secondary to hypertension (HCC)   Acute exacerbation of CHF (congestive heart failure) (HCC)   Diabetes mellitus (HCC)   Nausea and vomiting #Acute exacerbation of CHF Patient presents with worsening dyspnea and unable to elaborate on change in orthopnea as she does not lay flat for sleep. Admission labs showed a BMP >4,500. Clinically the patient appears hypervolemic evidenced by anasarca though we are unable to track weights due to safety limitations related to L BKA. Cough and wheezing are worse than baseline and she has been using home albuterol >3 times per day. - Lasix '80mg'$  injection x2 daily - Foley catheter placed  - Strict input/output - Nasal cannula 2L/minute - COVID-19 negative   #CKD Stage 4 Patient has long-standing history of CKD. Labs in ED showed GFR of 12, which is downtrend over the last year. She appears to have a anion gap metabolic acidosis (anion gap 21.9) with hypochloridemia. Knowing that she has been vomiting  and experiencing diarrhea this could be a hypochloridemia metabolic acidosis; this could also be uremia due to CKD.Bladder scan showed >400 mL in bladder and patient denied urinary urge. UA unremarkable for infection but did show moderate Hgb and 100 protein. AKI likely postrenal due to urinary rentention with current creatinine of 3.77 . - Foley catheter placed. - CMP   #Urinary Rentention  Bladder scan showed >400 mL in bladder and patient denied urinary urge. UA unremarkable for infection but did show moderate Hgb and 100 protein. -Foley catheter place  #Diabetes mellitus History of diabetes mellitus. HbA1c in 10/2020 of 5.0%, blood glucose currently 106. -  [place] when resumes eating to prevent hypoglycemia - Advance from NPO to clear  liquids as tolerated  - Gabapentin 300 mg twice daily for neuropathy   #Hyperlipidemia Lipid panel 11/2020 WNL. - Continue home atorvastatin 40 mg daily   #Hypertension Hypertensive during hospital admission with most recent recording of 154/91. - Metolazone 2.5 mg   #Nausea and vomiting Patient has been experiencing nausea and vomiting over the last several days and has noted some bloody vomitus and blood-tinged sputum. Localizes abdominal pain to lower quadrants however bilirubin incidentally found to be elevated at 4.6 (01/2021 1.2). She has been noted in the past to have multiple gallstones. [place] of RUQ reveal: Cholelithiasis and gallbladder sludge without focal tenderness for acute cholecystitis. There is gallbladder wall thickening that considered reactive. Ascites and right pleural effusion with distended IVC, question right heart failure. Liver surface lobulation compatible with cirrhosis.  - Advanced to clear liquid diet only   #Hematochezia Patient endorses bloody stools and was FOBT+ on ED exam. She does have a history of ischemic colitis noted during sigmoidoscopy in 08/2011 and was found to have internal hemorrhoids during colonoscopy in 2013. - Due to patient's CKD, CTA abdomen was not ordered. - Will monitor CBC and vitals for signs of increased blood loss.   PM Assessment: Cardiology consultant has confirmed severe low cardiac output HF / cardiogenic shock. She is unlikely to respond to therapy. She acknowledges that she was told that she is dying.  Comfort care is in place, all unnecessary medications and labs have been discontinued. Palliative care is on board and plan to transfer her to [facility]. Close contact family and friends have been contacted per her request. Thank you to palliative care team and cardiology team for speedy assistance.   Prior to Admission Living Arrangement: Anticipated Discharge Location: Barriers to Discharge: Dispo: Anticipated discharge in  approximately 1 day(s).   Timothy Lasso, MD 05/01/2021, 2:03 PM Pager: [phone] After 5pm on weekdays and 1pm on weekends: On Call pager [phone]
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
## 2021-05-01 NOTE — ED Notes (Signed)
Pt appears to be asleep. NAD noted. 

## 2021-05-01 NOTE — Care Management (Signed)
ED RN Care Manager spoke with [place] liaison for [facility], patient is hospice appropriate, and liaison  will closely  review chart in the am to make determination.

## 2021-05-01 NOTE — ED Notes (Signed)
Pt appears to be asleep. Resting comfortably. NAD noted.

## 2021-05-01 NOTE — ED Notes (Signed)
MD at bedside. MD notified about pts lactic

## 2021-05-01 NOTE — ED Notes (Signed)
Pt clean and dry. 

## 2021-05-01 NOTE — ED Notes (Signed)
Pt transported to CT ?

## 2021-05-01 NOTE — Consult Note (Addendum)
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 acute cholecystitis. There is gallbladder wall thickening that considered reactive. 2. Ascites and right pleural effusion with distended IVC, question right heart failure. 3. Liver surface lobulation compatible with cirrhosis. Electronically Signed   By: Monte Fantasia M.D.   On: 05/01/2021 04:24     Medications:     Current Medications:  atorvastatin  40 mg Oral Daily   calcitRIOL  0.5 mcg Oral Daily   empagliflozin  10 mg Oral QAC breakfast   furosemide  80 mg Intravenous BID   gabapentin  300 mg Oral BID   hydrocerin  1 application Topical BID   sevelamer carbonate  1,600 mg Oral TID WC    Infusions:    Assessment/Plan   Acute on Chronic Systolic Heart Failure>>Cardiogenic Shock  - Echo in 2016 EF 20-25% mod TR RVSP 63mHG - Myoview 2/21 EF 51% small anterior infarct - Echo 10/21 EF 20-25% mod-severe TR with tethering of TV by PPM wire. Moderate MR. RV low normal. - Echo 2/22 EF 30-35% severe TR - Myoview 12/21 EF 27% + infarct no significant ischemia - Etiology unclear but high suspicion for RV pacing induced CM (vs progressive CAD. Unable to cath with CKD IV. Seen by Dr. TLovena Le Not  candidate for CRT upgrade given end-stage physiology - Admit 3/22 w/ low output HF requiring DBA  - Now w/ a/c CHF w/ profound shock, CO2 13, Lactic acid 3.4  - Significant decline over the last several months. Cachetic and not a candidate for advanced therapies given multiple comorbidities including Stage IV CKD - She is actively dying w/ little left to offer from HR standpoint. Recommend palliative/comfort care - made DNR/DNI  - continue IV Lasix for comfort  - palliative care to start comfort drips   2. Stage IV CKD  - baseline Cr ranges 2.9-3.3. Admit 3/22 with creatinine up to 4.7 - 3.78 today  - [facility] - not [facility] candidate - on jardiance  3.  PAD s/p L BKA -  follows with [facility]  4. CAD:  - mild CAD on cath 2005 - Myoview 2/21 EF 51% small anterior infarct - Myoview 12/21 EF 27% + infarct no significant ischemia - no s/s of  angina  - continue statin. No ASA with Eliquis (currently on hold w/ GIB and +FOBT)  5. CHB  - s/p STJ pacer - followed by Dr. Lovena Le, not a candidate for BiV upgrade   6. PAF - NSR on tele, HR controlled  - Eliquis on hold for GIB, Hgb 14   Length of Stay: 1  Brittainy Simmons, PA-C  05/01/2021, 1:25 PM  Advanced Heart Failure Team Pager [phone] (M-F; 7a - 5p)  Please contact [facility] for night-coverage after hours (4p -7a ) and weekends on [email or web address]  Agree with above.   Patient well known to me from HF Program. 70 y/o with end-stage HF, CKD IV and previous hospitalization requiring inotropic support.   Now presents with profound cardiogenic shock with lactic acidosis and low output symptoms.   On exam General:  Lethargic. Uncomfortable HEENT: normal Neck: supple. JVP to jaw  Carotids 2+ bilat; no bruits. No lymphadenopathy or thryomegaly appreciated. Cor: PMI nondisplaced. Regular rate & rhythm. +s3 Lungs: clear Abdomen: soft, diffusely tender, +distended. No hepatosplenomegaly. No bruits or masses. Hypoactive  bowel sounds. Extremities: no cyanosis, clubbing, rash, s/p L BKA 2+ edema  gangrenous appearing right foot  Neuro: alert & oriented weak. Non-focal  She is actively dying from end-stage HF and cardiogenic shock. There are no options for durable support here. I discussed this with her and suggested comfort care. We also discussed code status and she would want to be DNR/DNI.   I have discussed with the ED team and also have placed an urgent Palliative Care consult to help with comfort meds and disposition.   CRITICAL CARE Performed by: Glori Bickers  Total critical care time: 55 minutes  Critical care time was exclusive of separately billable procedures and treating other patients.  Critical care was necessary to treat or prevent imminent or life-threatening deterioration.  Critical care was time spent personally by me (independent of midlevel providers or residents) on the following activities: development of treatment plan with patient and/or surrogate as well as nursing, discussions with consultants, evaluation of patient's response to treatment, examination of patient, obtaining history from patient or surrogate, ordering and performing treatments and interventions, ordering and review of laboratory studies, ordering and review of radiographic studies, pulse oximetry and re-evaluation of patient's condition.  Glori Bickers, MD  5:07 PM

## 2021-05-02 ENCOUNTER — Other Ambulatory Visit: Payer: Self-pay | Admitting: Internal Medicine

## 2021-05-02 DIAGNOSIS — I5023 Acute on chronic systolic (congestive) heart failure: Secondary | ICD-10-CM | POA: Diagnosis not present

## 2021-05-02 DIAGNOSIS — K529 Noninfective gastroenteritis and colitis, unspecified: Secondary | ICD-10-CM

## 2021-05-07 ENCOUNTER — Encounter: Payer: Medicare Other | Admitting: Vascular Surgery

## 2021-05-07 LAB — CULTURE, BLOOD (ROUTINE X 2)
Culture: NO GROWTH
Culture: NO GROWTH

## 2021-05-14 ENCOUNTER — Telehealth: Payer: Medicare Other

## 2021-05-28 NOTE — Progress Notes (Signed)
Internal Medicine Clinic Attending  I saw and evaluated the patient.  I personally confirmed the key portions of the history and exam documented by Dr.  Dean  and I reviewed pertinent patient test results.  The assessment, diagnosis, and plan were formulated together and I agree with the documentation in the resident's note.  

## 2021-05-30 NOTE — Progress Notes (Signed)
This chaplain attempted to respond to PMT consult for EOL spiritual care.  The chaplain was updated by PMT NP-EG the Pt. has passed. The chaplain understands there is not family at the bedside.

## 2021-05-30 NOTE — Progress Notes (Signed)
  Daily Progress Note   Patient Name: [name] [name]       Date: 05/26/21 DOB: 11-23-1950  Age: 70 y.o. MRN#: [id number] Attending Physician: Angelica Pou, MD Primary Care Physician: [name], MD Admit Date: 05/08/2021 Length of Stay: 2 days  Reason for Consultation/Follow-up: Terminal Care  I went to the patient's room for symptoms check and any needed comfort care medication adjustments. Family medicine team was outside the room and informed me the patient had just passed within the last 30 minutes. No family at the bedside at this time.   Walden Field, NP Palliative Medicine Team  Team Phone # [phone] (Nights/Weekends) 26-May-2021 10:45 am   NO CHARGE

## 2021-05-30 NOTE — Progress Notes (Signed)
Nursing staff have been unable to reach patient's sister - Lenna Sciara- to inform of death. I called patient's friend - Ihor Austin. She states she will be at the hospital soon.

## 2021-05-30 NOTE — Death Summary Note (Signed)
  Name: [name] [name] MRN: [id number] DOB: December 25, 1950 70 y.o.  Date of Admission: 05/06/2021  3:52 PM Date of Discharge: May 15, 2021 Attending Physician: Angelica Pou, MD  Discharge Diagnosis: Active Problems:   Hyperlipidemia   Essential hypertension   Chronic diarrhea   CKD stage 4 secondary to hypertension ([place])   Acute exacerbation of CHF (congestive heart failure) ([facility])   Diabetes mellitus ([place])   Nausea and vomiting   Cause of death: End stage systolic heart failure with profound Cardiogenic shock Time of death: 10:15am  Disposition and follow-up:   [name] [name] was discharged from [facility] in expired condition.    Hospital Course: 05/22/2021 -Mr. Mcmurdie presented to the ED with abdominal pain and shortness of breath.  She was found to be in acute CHF exacerbation evidenced by BNP greater than 4500, anasarca and shallow breathing noted on physical exam. During interview she was very tired and drowsy, tachypneic with endorsed difficulty breathing. She was volume overloaded and was started on Lasix for diuresis.  She was also found to have urinary retention and a Foley catheter was placed.   05/01/21- No acute events overnight; current medication management maintained. Cardiology was consulted and they determined she has severe low cardiac output <35% and reports she is not likely not respond to therapy. Palliative care assisted and called [name] [name]'s family, loved ones and pastor. 2021-05-15- [name] [name] was noted to be very somnolent, agonal breathing and terminal respiratory secretions noted on physical exam.  She was placed on Dilaudid for pain relief and comfort.  [name] [name] expired at 10:15 AM.  Signed: Timothy Lasso, MD May 15, 2021, 12:47 PM

## 2021-05-30 NOTE — ED Notes (Signed)
Pt transported upstairs via RN since pt is on dilaudid. Transported via stretcher.

## 2021-05-30 NOTE — Progress Notes (Addendum)
Advanced Heart Failure Rounding Note  PCP-Cardiologist: None   Subjective:    Pt now full comfort care. DNR/DNI.   She is actively dying w/ agonal breaths. On dilaudid gtt. Palliative care at bedside.    Objective:   Weight Range:   There is no height or weight on file to calculate BMI.   Vital Signs:   Temp:  [97.5 F (36.4 C)] 97.5 F (36.4 C) (08/04 0018) Pulse Rate:  [64-70] 69 (08/04 0038) Resp:  [10-24] 10 (08/04 0038) BP: (131-159)/(68-91) 142/79 (08/04 0038) SpO2:  [91 %-100 %] 91 % (08/04 0038) Last BM Date: 05/01/21  Weight change: There were no vitals filed for this visit.  Intake/Output:   Intake/Output Summary (Last 24 hours) at 05/30/21 1016 Last data filed at 2021-05-30 0830 Gross per 24 hour  Intake 9.74 ml  Output 1225 ml  Net -1215.26 ml      Physical Exam    General:  cachetic female w/ agonal breathing.  HEENT: Normal Neck: Supple. JVP elevated to jaw . Carotids 2+ bilat; no bruits. No lymphadenopathy or thyromegaly appreciated. Cor: PMI nondisplaced. Regular rhythm, tachy rate No rubs, gallops or murmurs. Lungs: course  Abdomen: distended, nontender. No hepatosplenomegaly. No bruits or masses. Good bowel sounds. Extremities: No cyanosis, clubbing, rash, s/p left BKA, Rt LE w/ dry gangrene  Neuro: lethargic    Telemetry   N/a   EKG    N/A   Labs    CBC Recent Labs    05/09/2021 1600 05/01/21 0244  WBC 11.5* 13.3*  NEUTROABS 10.2*  --   HGB 13.6 14.2  HCT 40.3 41.8  MCV 91.6 91.7  PLT 92* 89*   Basic Metabolic Panel Recent Labs    05/22/2021 1600 05/10/2021 2142 05/01/21 0244  NA 145  --  145  K 4.5  --  4.9  CL 114*  --  115*  CO2 15*  --  13*  GLUCOSE 101*  --  96  BUN 51*  --  53*  CREATININE 3.77*  --  3.78*  CALCIUM 9.7  --  9.4  MG  --  1.5* 1.9   Liver Function Tests Recent Labs    05/22/2021 1600  AST 21  ALT 12  ALKPHOS 80  BILITOT 4.6*  PROT 6.9  ALBUMIN 3.5   No results for input(s): LIPASE,  AMYLASE in the last 72 hours. Cardiac Enzymes No results for input(s): CKTOTAL, CKMB, CKMBINDEX, TROPONINI in the last 72 hours.  BNP: BNP (last 3 results) Recent Labs    01/17/21 1116 02/01/21 2013 05/07/2021 1600  BNP >4,500.0* >4,500.0* >4,500.0*    ProBNP (last 3 results) No results for input(s): PROBNP in the last 8760 hours.   D-Dimer No results for input(s): DDIMER in the last 72 hours. Hemoglobin A1C Recent Labs    05/01/21 0244  HGBA1C 4.6*   Fasting Lipid Panel No results for input(s): CHOL, HDL, LDLCALC, TRIG, CHOLHDL, LDLDIRECT in the last 72 hours. Thyroid Function Tests No results for input(s): TSH, T4TOTAL, T3FREE, THYROIDAB in the last 72 hours.  Invalid input(s): FREET3  Other results:   Imaging    No results found.   Medications:     Scheduled Medications:   Infusions:  HYDROmorphone 0.5 mg/hr (05/01/21 2014)    PRN Medications: glycopyrrolate, haloperidol lactate, LORazepam  Assessment/Plan   End-Stage (Stage D) Systolic Heart Failure w/ Profound Cardiogenic Shock  - Echo in 2016 EF 20-25% mod TR RVSP 55mHG - Myoview 2/21 EF 51%  small anterior infarct - Echo 10/21 EF 20-25% mod-severe TR with tethering of TV by PPM wire. Moderate MR. RV low normal. - Echo 2/22 EF 30-35% severe TR - Myoview 12/21 EF 27% + infarct no significant ischemia - Etiology unclear but high suspicion for RV pacing induced CM (vs progressive CAD. Unable to cath with CKD IV. Seen by Dr. Lovena Le. Not candidate for CRT upgrade given end-stage physiology - Admit 3/22 w/ low output HF requiring DBA - Now w/ a/c CHF w/ profound shock, initial CO2 13, Lactic acid 3.4 - Significant decline over the last several months. Cachetic and not a candidate for advanced therapies given multiple comorbidities including Stage IV CKD - She is actively dying w/ little left to offer from HR standpoint.  - She is now full comfort care w/ palliative care team assisting w/ comfort gtt  s - made DNR/DNI  - anticipate hospital death today  2. Stage IV CKD  - baseline Cr ranges 2.9-3.3.  - c/b cardiorenal syndrome   - not HD candidate   3.  PAD s/p L BKA - Rt LE now gangrenous    4. CAD:  - mild CAD on cath 2005 - Myoview 2/21 EF 51% small anterior infarct - Myoview 12/21 EF 27% + infarct no significant ischemia    5. CHB  - s/p STJ pacer - followed by Dr. Lovena Le, not a candidate for BiV upgrade      Appreciate palliative care team's assistance.     Length of Stay: 2  Lyda Jester, PA-C  05/30/21, 10:16 AM  Advanced Heart Failure Team Pager [phone] (M-F; 7a - 5p)  Please contact [facility] for night-coverage after hours (5p -7a ) and weekends on [email or web address]   Patient seen and examined with the above-signed Advanced Practice Provider and/or Housestaff. I personally reviewed laboratory data, imaging studies and relevant notes. I independently examined the patient and formulated the important aspects of the plan. I have edited the note to reflect any of my changes or salient points. I have personally discussed the plan with the patient and/or family.  Patient actively dying on dilaudid gtt. She is comfortable. Agonal breathing on exam. Appreciate Palliative Care team's assistance.   Glori Bickers, MD

## 2021-05-30 NOTE — Progress Notes (Signed)
Patient expired at [id number]. 2 RN Tai and Dianna assessed the patient and pronounced the death. MD notified and is at the bedside. HCPOA has been notified. Post Mortem care has been initiated and completed. [facility] referral has been initiated and completed as well. PIVs have been removed and foley has been removed.

## 2021-05-30 NOTE — Plan of Care (Signed)
  Problem: Pain Managment: Goal: General experience of comfort will improve Outcome: Progressing   

## 2021-05-30 NOTE — Progress Notes (Signed)
37 cc Dilaudid wasted verify with Loreli Slot and Systems analyst, CN.

## 2021-05-30 DEATH — deceased

## 2022-02-14 ENCOUNTER — Telehealth: Payer: Self-pay
